# Patient Record
Sex: Female | Born: 1935 | Race: White | Hispanic: No | Smoking: Former smoker
Health system: Southern US, Community
[De-identification: ages and names within clinical notes are randomized; demographics above are authoritative.]

## PROBLEM LIST (undated history)

## (undated) DIAGNOSIS — E559 Vitamin D deficiency, unspecified: Secondary | ICD-10-CM

## (undated) DIAGNOSIS — I10 Essential (primary) hypertension: Secondary | ICD-10-CM

## (undated) DIAGNOSIS — H269 Unspecified cataract: Secondary | ICD-10-CM

## (undated) DIAGNOSIS — I639 Cerebral infarction, unspecified: Secondary | ICD-10-CM

## (undated) DIAGNOSIS — K635 Polyp of colon: Secondary | ICD-10-CM

## (undated) DIAGNOSIS — M5030 Other cervical disc degeneration, unspecified cervical region: Secondary | ICD-10-CM

## (undated) DIAGNOSIS — M722 Plantar fascial fibromatosis: Secondary | ICD-10-CM

## (undated) DIAGNOSIS — M810 Age-related osteoporosis without current pathological fracture: Secondary | ICD-10-CM

## (undated) DIAGNOSIS — E119 Type 2 diabetes mellitus without complications: Secondary | ICD-10-CM

## (undated) DIAGNOSIS — E039 Hypothyroidism, unspecified: Secondary | ICD-10-CM

## (undated) DIAGNOSIS — C9590 Leukemia, unspecified not having achieved remission: Secondary | ICD-10-CM

## (undated) DIAGNOSIS — E785 Hyperlipidemia, unspecified: Secondary | ICD-10-CM

## (undated) DIAGNOSIS — J341 Cyst and mucocele of nose and nasal sinus: Secondary | ICD-10-CM

## (undated) DIAGNOSIS — D649 Anemia, unspecified: Secondary | ICD-10-CM

## (undated) DIAGNOSIS — J439 Emphysema, unspecified: Secondary | ICD-10-CM

## (undated) DIAGNOSIS — G589 Mononeuropathy, unspecified: Secondary | ICD-10-CM

## (undated) DIAGNOSIS — Z923 Personal history of irradiation: Secondary | ICD-10-CM

## (undated) DIAGNOSIS — F419 Anxiety disorder, unspecified: Secondary | ICD-10-CM

## (undated) HISTORY — DX: Cyst and mucocele of nose and nasal sinus: J34.1

## (undated) HISTORY — DX: Anemia, unspecified: D64.9

## (undated) HISTORY — DX: Hyperlipidemia, unspecified: E78.5

## (undated) HISTORY — DX: Polyp of colon: K63.5

## (undated) HISTORY — DX: Age-related osteoporosis without current pathological fracture: M81.0

## (undated) HISTORY — PX: EYE SURGERY: SHX253

## (undated) HISTORY — DX: Plantar fascial fibromatosis: M72.2

## (undated) HISTORY — DX: Anxiety disorder, unspecified: F41.9

## (undated) HISTORY — PX: ESOPHAGOGASTRODUODENOSCOPY: SHX1529

## (undated) HISTORY — PX: APPENDECTOMY: SHX54

## (undated) HISTORY — DX: Unspecified cataract: H26.9

## (undated) HISTORY — DX: Other cervical disc degeneration, unspecified cervical region: M50.30

## (undated) HISTORY — DX: Emphysema, unspecified: J43.9

## (undated) HISTORY — DX: Vitamin D deficiency, unspecified: E55.9

## (undated) HISTORY — PX: COLONOSCOPY W/ POLYPECTOMY: SHX1380

## (undated) HISTORY — PX: CHOLECYSTECTOMY: SHX55

## (undated) HISTORY — DX: Type 2 diabetes mellitus without complications: E11.9

---

## 1998-07-08 ENCOUNTER — Encounter: Payer: Self-pay | Admitting: Emergency Medicine

## 1998-07-08 ENCOUNTER — Emergency Department (HOSPITAL_COMMUNITY): Admission: EM | Admit: 1998-07-08 | Discharge: 1998-07-08 | Payer: Self-pay | Admitting: Emergency Medicine

## 1998-07-13 ENCOUNTER — Encounter: Payer: Self-pay | Admitting: Emergency Medicine

## 1998-07-13 ENCOUNTER — Inpatient Hospital Stay (HOSPITAL_COMMUNITY): Admission: EM | Admit: 1998-07-13 | Discharge: 1998-07-18 | Payer: Self-pay | Admitting: Emergency Medicine

## 1998-07-14 ENCOUNTER — Encounter: Payer: Self-pay | Admitting: Gastroenterology

## 1998-07-15 ENCOUNTER — Encounter: Payer: Self-pay | Admitting: Gastroenterology

## 1998-07-17 ENCOUNTER — Encounter: Payer: Self-pay | Admitting: Gastroenterology

## 1998-10-11 ENCOUNTER — Encounter: Admission: RE | Admit: 1998-10-11 | Discharge: 1999-01-09 | Payer: Self-pay | Admitting: Internal Medicine

## 2001-10-22 ENCOUNTER — Encounter: Payer: Self-pay | Admitting: Emergency Medicine

## 2001-10-22 ENCOUNTER — Inpatient Hospital Stay (HOSPITAL_COMMUNITY): Admission: EM | Admit: 2001-10-22 | Discharge: 2001-11-02 | Payer: Self-pay | Admitting: Emergency Medicine

## 2001-10-23 ENCOUNTER — Encounter: Payer: Self-pay | Admitting: Internal Medicine

## 2001-10-25 ENCOUNTER — Encounter: Payer: Self-pay | Admitting: Internal Medicine

## 2002-10-07 ENCOUNTER — Emergency Department (HOSPITAL_COMMUNITY): Admission: EM | Admit: 2002-10-07 | Discharge: 2002-10-08 | Payer: Self-pay | Admitting: Emergency Medicine

## 2002-10-10 ENCOUNTER — Encounter (HOSPITAL_BASED_OUTPATIENT_CLINIC_OR_DEPARTMENT_OTHER): Payer: Self-pay | Admitting: Internal Medicine

## 2002-10-10 ENCOUNTER — Encounter: Admission: RE | Admit: 2002-10-10 | Discharge: 2002-10-10 | Payer: Self-pay | Admitting: Internal Medicine

## 2003-01-19 ENCOUNTER — Inpatient Hospital Stay (HOSPITAL_COMMUNITY): Admission: EM | Admit: 2003-01-19 | Discharge: 2003-01-22 | Payer: Self-pay | Admitting: Emergency Medicine

## 2003-01-20 ENCOUNTER — Encounter: Payer: Self-pay | Admitting: Internal Medicine

## 2003-06-28 ENCOUNTER — Inpatient Hospital Stay (HOSPITAL_COMMUNITY): Admission: EM | Admit: 2003-06-28 | Discharge: 2003-07-02 | Payer: Self-pay | Admitting: Emergency Medicine

## 2003-06-28 ENCOUNTER — Encounter: Payer: Self-pay | Admitting: Emergency Medicine

## 2003-06-29 ENCOUNTER — Encounter (INDEPENDENT_AMBULATORY_CARE_PROVIDER_SITE_OTHER): Payer: Self-pay | Admitting: *Deleted

## 2003-06-29 ENCOUNTER — Encounter: Payer: Self-pay | Admitting: Internal Medicine

## 2003-07-02 ENCOUNTER — Inpatient Hospital Stay: Admission: RE | Admit: 2003-07-02 | Discharge: 2003-07-13 | Payer: Self-pay | Admitting: Internal Medicine

## 2003-07-13 ENCOUNTER — Inpatient Hospital Stay (HOSPITAL_COMMUNITY)
Admission: RE | Admit: 2003-07-13 | Discharge: 2003-08-04 | Payer: Self-pay | Admitting: Physical Medicine & Rehabilitation

## 2003-09-09 ENCOUNTER — Encounter
Admission: RE | Admit: 2003-09-09 | Discharge: 2003-12-08 | Payer: Self-pay | Admitting: Physical Medicine & Rehabilitation

## 2003-09-22 ENCOUNTER — Encounter
Admission: RE | Admit: 2003-09-22 | Discharge: 2003-12-21 | Payer: Self-pay | Admitting: Physical Medicine & Rehabilitation

## 2003-12-22 ENCOUNTER — Encounter
Admission: RE | Admit: 2003-12-22 | Discharge: 2004-01-08 | Payer: Self-pay | Admitting: Physical Medicine & Rehabilitation

## 2004-01-12 ENCOUNTER — Emergency Department (HOSPITAL_COMMUNITY): Admission: EM | Admit: 2004-01-12 | Discharge: 2004-01-12 | Payer: Self-pay | Admitting: Emergency Medicine

## 2004-01-13 ENCOUNTER — Inpatient Hospital Stay (HOSPITAL_COMMUNITY): Admission: AD | Admit: 2004-01-13 | Discharge: 2004-01-26 | Payer: Self-pay | Admitting: Internal Medicine

## 2004-01-18 ENCOUNTER — Encounter (INDEPENDENT_AMBULATORY_CARE_PROVIDER_SITE_OTHER): Payer: Self-pay | Admitting: *Deleted

## 2004-01-26 ENCOUNTER — Inpatient Hospital Stay: Admission: RE | Admit: 2004-01-26 | Discharge: 2004-02-12 | Payer: Self-pay | Admitting: Internal Medicine

## 2004-02-15 ENCOUNTER — Encounter: Admission: RE | Admit: 2004-02-15 | Discharge: 2004-04-14 | Payer: Self-pay | Admitting: Internal Medicine

## 2004-03-18 ENCOUNTER — Encounter
Admission: RE | Admit: 2004-03-18 | Discharge: 2004-06-16 | Payer: Self-pay | Admitting: Physical Medicine & Rehabilitation

## 2004-11-11 ENCOUNTER — Inpatient Hospital Stay (HOSPITAL_COMMUNITY): Admission: EM | Admit: 2004-11-11 | Discharge: 2004-11-14 | Payer: Self-pay | Admitting: Emergency Medicine

## 2005-03-07 ENCOUNTER — Ambulatory Visit: Payer: Self-pay | Admitting: Oncology

## 2006-04-03 ENCOUNTER — Emergency Department (HOSPITAL_COMMUNITY): Admission: EM | Admit: 2006-04-03 | Discharge: 2006-04-03 | Payer: Self-pay | Admitting: Pediatrics

## 2008-10-05 ENCOUNTER — Inpatient Hospital Stay (HOSPITAL_COMMUNITY): Admission: EM | Admit: 2008-10-05 | Discharge: 2008-10-08 | Payer: Self-pay | Admitting: Emergency Medicine

## 2010-12-26 LAB — URINALYSIS, ROUTINE W REFLEX MICROSCOPIC
Bilirubin Urine: NEGATIVE
Ketones, ur: NEGATIVE mg/dL
pH: 6.5 (ref 5.0–8.0)

## 2010-12-26 LAB — POCT CARDIAC MARKERS
CKMB, poc: <1 ng/mL — ABNORMAL LOW (ref 1.0–8.0)
CKMB, poc: <1 ng/mL — ABNORMAL LOW (ref 1.0–8.0)
Myoglobin, poc: 42.1 ng/mL (ref 12–200)
Myoglobin, poc: 78.2 ng/mL (ref 12–200)

## 2010-12-26 LAB — URINE MICROSCOPIC-ADD ON

## 2010-12-26 LAB — BASIC METABOLIC PANEL
Calcium: 8.5 mg/dL (ref 8.4–10.5)
GFR calc Af Amer: >60 mL/min (ref >60)
GFR calc non Af Amer: >60 mL/min (ref >60)
Potassium: 3.6 mEq/L (ref 3.5–5.1)
Sodium: 142 mEq/L (ref 135–145)

## 2010-12-26 LAB — DIFFERENTIAL
Basophils Absolute: 0 10*3/uL (ref 0.0–0.1)
Basophils Relative: 0 % (ref 0–1)
Eosinophils Absolute: 0.1 10*3/uL (ref 0.0–0.7)
Lymphocytes Relative: 18 % (ref 12–46)
Lymphs Abs: 1.4 10*3/uL (ref 0.7–4.0)
Monocytes Absolute: 0.6 10*3/uL (ref 0.1–1.0)
Monocytes Relative: 8 % (ref 3–12)
Neutrophils Relative %: 73 % (ref 43–77)

## 2010-12-26 LAB — CBC
Hemoglobin: 10.5 g/dL — ABNORMAL LOW (ref 12.0–15.0)
MCV: 91.3 fL (ref 78.0–100.0)
Platelets: 242 10*3/uL (ref 150–400)
RBC: 3.49 MIL/uL — ABNORMAL LOW (ref 3.87–5.11)
RBC: 3.81 MIL/uL — ABNORMAL LOW (ref 3.87–5.11)
RDW: 13.7 % (ref 11.5–15.5)
WBC: 4.9 10*3/uL (ref 4.0–10.5)

## 2010-12-26 LAB — COMPREHENSIVE METABOLIC PANEL
ALT: 26 U/L (ref 0–35)
AST: 36 U/L (ref 0–37)
Albumin: 3.9 g/dL (ref 3.5–5.2)
Alkaline Phosphatase: 59 U/L (ref 39–117)
BUN: 8 mg/dL (ref 6–23)
Creatinine, Ser: 0.71 mg/dL (ref 0.4–1.2)
Creatinine, Ser: 0.72 mg/dL (ref 0.4–1.2)
GFR calc non Af Amer: >60 mL/min (ref >60)
Potassium: 3.7 mEq/L (ref 3.5–5.1)
Potassium: 3.7 mEq/L (ref 3.5–5.1)
Sodium: 136 mEq/L (ref 135–145)
Sodium: 141 mEq/L (ref 135–145)

## 2010-12-26 LAB — AMYLASE: Amylase: 52 U/L (ref 27–131)

## 2010-12-26 LAB — GLUCOSE, CAPILLARY
Glucose-Capillary: 101 mg/dL — ABNORMAL HIGH (ref 70–99)
Glucose-Capillary: 109 mg/dL — ABNORMAL HIGH (ref 70–99)
Glucose-Capillary: 125 mg/dL — ABNORMAL HIGH (ref 70–99)

## 2010-12-26 LAB — CROSSMATCH: Antibody Screen: NEGATIVE

## 2010-12-26 LAB — URINE CULTURE: Colony Count: 30000

## 2010-12-26 LAB — ABO/RH: ABO/RH(D): O POS

## 2011-01-24 NOTE — Discharge Summary (Signed)
[name], ARCOS NO.:  [id number]   MEDICAL RECORD NO.:  [id number]          PATIENT TYPE:  INP   LOCATION:  5128                         FACILITY:  [facility]   PHYSICIAN:  Gwen Pounds, MD       DATE OF BIRTH:  August 03, 1929   DATE OF ADMISSION:  10/05/2008  DATE OF DISCHARGE:                               DISCHARGE SUMMARY   DISCHARGE DIAGNOSES:  1. Viral gastroenteritis with nausea and diarrhea.  2. Dehydration resolved.  3. Mild acute pancreatitis resolved.  4. Type 2 diabetes mellitus without issue, diet controlled. .  5. Hypertension without issue.  6. Gait disturbance, getting physical/occupational therapy.  7. Hypothyroidism.  8. Hyperlipidemia.  9. Osteoporosis.  10.Vitamin D deficiency.  11.History of stroke in October 2004.  12.Cholecystectomy in 2002.  13.Acute myelogenous leukemia in remission.   DISCHARGE MEDICATIONS:  1. Synthroid 100 daily except 0 on Sunday.  2. Lipitor 20 daily.  3. Xanax 0.25 mg p.o. b.i.d. p.r.n.  4. Aggrenox 1 tablet p.o. b.i.d.  5. K-Dur 20 mEq p.o. daily.  6. Benicar HCT 20/12.5 mg p.o. daily.  7. She is to hold the potassium and the Benicar for approximately 1      week or until blood pressure starts going up over 135-140/85.  8. Vitamin D 1000 units per day.  9. Calcitonin nasal spray 1 spray daily alternate nostrils.   DISCHARGE DIET:  Low-sodium, heart-healthy   DISCHARGE ACTIVITIES:  Home health physical/occupational therapy and  increase activity slowly.   FOLLOW UP:  She is to return to see Dr. Timothy Lasso as she is already  scheduled and call in one week with an update.   HISTORY OF PRESENT ILLNESS:  Briefly, [name] is a 77 year old  female with multiple medical problems who had a 2 to 3-day history  history of flu-like illness with increasing fatigue, increasing  weakness, increase frailty, nausea without vomiting, body aches and  diarrhea.  Increased blood sugars were noted and abdominal pain  and  distention were noted.  She took Imodium and had increasing pain.  She  was unable to keep fluids down appropriately.  She went to the emergency  department for further evaluation and treatment.  In the ED she was seen  and evaluated.  She had an elevated lipase.  She had a KUB compatible  with ileus and I was called for inpatient admission.  She was also  treated with IV fluids, Protonix, Zofran.  Tylenol was given, and by the  time I saw her, she was pain free and I finished her admission.   HOSPITAL COURSE:  [name] was admitted through the emergency  department on October 05, 2008 with symptoms of viral  gastroenteritis,  weakness, dehydration, ileus, and mild pancreatitis.  We put on relative  bowel rest. We  put her on gentle hydration.  We considered abdominal CT  if the symptoms worsened.  We ordered physical and occupational therapy  and we placed her on IV medications for symptom relief.  I followed  labs.  By the time the following  morning came around, her amylase and  lipase had resolved and went to normal.  Her BMET and CBC were normal  except for a slightly elevated hemoglobin 11.6.  Physical exam was  relatively normal except for a little bit of gaseous distention.  My  assessment was due to her viral gastroenteritis leading to an ileus and  pancreatitis, including nausea and diarrhea, dehydration and weakness,  and along with a headache.  The symptoms were resolving.  We continued  the hydration.  We continued the relative bowel rest.  She required  Toradol and Zofran as well as other medications.  This eventually did  work.  A repeat KUB showed a resolution of the ileus.  The rest of her  medical conditions stayed stable.  She was sitting in on January 27 and  she was still pretty weak.  Physical and occupational therapy saw her.  Home health OT and home health PT would help address her safety issues,  transfers and balance, and gait issues.  These will be  set up.  On  October 08, 2008 she had no complaints.  She was doing better.  She had  three bowel movements yesterday, minimal loose but not diarrhea.  All of  her vital signs were stable.  Her blood sugars were fine.  Her blood  pressures was 124/68.  She was deemed medically ready for discharge with  home health PT and OT and she can restart her blood pressure medications  as an outpatient.  She looked back to her baseline, just a little bit  weak and frail.  She resides with her family who can also help take care  of her medical and emotional needs.  Again, she is being discharged in  stable condition.      Gwen Pounds, MD  Electronically Signed     JMR/MEDQ  D:  10/08/2008  T:  10/08/2008  Job:  [id number]

## 2011-01-24 NOTE — H&P (Signed)
NAMEDELENE, MORAIS NO.:  [id number]   MEDICAL RECORD NO.:  [id number]          PATIENT TYPE:  INP   LOCATION:  5128                         FACILITY:  [facility]   PHYSICIAN:  Gwen Pounds, MD       DATE OF BIRTH:  November 09, 1928   DATE OF ADMISSION:  10/05/2008  DATE OF DISCHARGE:                              HISTORY & PHYSICAL   PRIMARY CARE Chandler Swiderski:  Gwen Pounds, MD   CHIEF COMPLAINT:  Dehydration, diarrhea, weakness, viral illness, mild  pancreatitis, and ileus.   HISTORY OF PRESENT ILLNESS:  This is a 77 year old female who woke up  yesterday weak and frail, nauseated, no vomiting, body ache, and a kind  of flu-like illness.  Her blood sugars were high for the day and were in  the 130-200 range.  Last night, she went to bed, she did not sleep well,  and this morning, she woke up and had diarrhea x4, crampy loose, no  blood.  She had mild distention in the belly.  She took some Imodium and  then had in about a hour or two later had left upper quadrant mild-to-  moderate pain, it radiated to her back and also had caused the headache.  She called back to our office and was sent to the emergency department.  She did have a BRAT diet today.  Her sister says her symptoms have  progressed.  Again, she was sent to the ED for eval and treatment where  here she had an elevated lipase, a KUB that was compatible with an  ileus.  She will need admission for evaluation and treatment.  In the  emergency department, she was started on IV fluids, Protonix, Zofran.  Tylenol was given as well.  Now, she is relatively pain free and will be  admitted for evaluation and treatment.   MEDICATIONS:  1. Synthroid 150 daily except 0 on Sunday.  2. Lipitor 20 daily.  3. Xanax 0.25 mg p.o. b.i.d.  4. Aggrenox 1 p.o. b.i.d.  5. K-Dur 20 mEq p.o. daily.  6. Benicar HCT 20/12.5 daily.  7. Vitamin D 1000 units daily.  8. Calcitonin nasal spray alternating nostrils daily.   ALLERGIES:   PREDNISONE.   PAST MEDICAL HISTORY:  1. Diabetes mellitus type 2, diet controlled.  2. Hypertension.  3. Hypothyroid.  4. Hyperlipidemia.  5. Osteoporosis.  6. Vitamin D deficiency.  7. History of CVA in October 2004.  8. Cholecystectomy around 2002.  9. Acute myelogenous leukemia, currently in remission.   SOCIAL HISTORY:  She is widowed, 3 children, 3 grandchildren.  She quit  tobacco in 1994.   FAMILY HISTORY:  Coronary artery disease and her sister died of lung  cancer.   REVIEW OF SYSTEMS:  Recent strep throat.  No one in the house or in the  grandchildren.  Otherwise right now, she has dry mouth, occipital  headache, mild abdominal pain, and back pain.  No nausea, no vomiting.  Diarrhea that is having no blood.  She is weak, tired, frail.  No fever  and positive chills were  noted.   PHYSICAL EXAMINATION:  VITAL SIGNS:  Temperature 98.4, blood pressure  128-142/49-69, heart rate 81-100, respiratory rate 16-20, sating 96% on  room air.  GENERAL:  Alert and oriented x3.  PULMONARY:  Clear to auscultation bilaterally.  CARDIAC:  Regular.  ABDOMEN:  Soft.  There is an expected minimal bloating.  No pain.  EXTREMITIES:  No edema.  NECK:  No JVD.  HEENT:  Oropharynx is dry.   ANCILLARY DATA:  White count 7.6, hemoglobin 12.7, platelet count 265.  Sodium 136, potassium 3.7, chloride 101, bicarb 26, BUN 13, creatinine  0.72, glucose 127.  Troponin I less is less than 0.05.  LFTs are within  normal limits.  Lipase is 87 with normal range of 11-59.  Urinalysis is  negative.  Acute abdominal series shows normal chest, nonspecific ileus.  EKG shows sinus tachycardia without changes.   ASSESSMENT:  This is an elderly female with 36 hours of viral  gastroenteritis versus flu, weakness, dehydration, ileus, and mild  pancreatitis.  She does not have a gallbladder, although I guess  gallstones and choledocholithiasis there is low possibility.  I have no  reason to suspect that her  triglycerides are up, as she is nonalcoholic  and certainly with mild illness that she has had, we can certainly  follow this clinically before considering any CT scans and further  tests.   PLAN:  1. Admit.  2. Gentle hydration.  3. Sips and chips.  4. Follow labs.  5. Consider abdominal CT if situation worsens.  6. PT/OT, case management.  7. Bedside commode.  8. Expect 48- to 72-hour hospital stay if she follows what I      anticipate.  9. Home medications, I was able to restart.  10.DVT prophylaxis had been ordered.  11.I will follow up with CMET, CBC, lipase, amylase tomorrow a.m.  12.KUB tomorrow portable and again hold on abdominal ultrasound at the      moment.      Gwen Pounds, MD  Electronically Signed     JMR/MEDQ  D:  10/05/2008  T:  10/06/2008  Job:  [id number]

## 2011-01-27 NOTE — H&P (Signed)
. [facility]  Patient:    [name], [name] Visit Number: [id number] MRN: [id number]          Service Type: MED Location: [id number] Attending Physician:  Gwen Pounds Dictated by:   Gwen Pounds, M.D. Admit Date:  10/22/2001                           History and Physical  CHIEF COMPLAINT:  Hypertension, headache, dizziness, nausea, diarrhea x three.   HISTORY OF PRESENT ILLNESS:  This 77 year old white female with past medical history compatible with diet-controlled diabetes, hypothyroid, hypertension, hyperlipidemia, and AML now in remission who is come in twice in the last two days to see me. On February 10, she came in for a blood pressure check one month after starting hydrochlorothiazide 12.5 mg. This was started on September 09, 2001. Blood pressure was checked, and it was 220/92. She had symptoms of dizziness, nausea, and presyncope. She was brought back to an exam room and was allowed to relax. She denied any chest pain or shortness of breath. She was given Altace 5 mg p.o. x one and started on it q.d. Her symptoms improved, and she was discharged with blood pressure 190/95 and told to go to the emergency room if symptoms recurred or worsened. They did recur, and she called the M.D. on call who advised her to go the ED. She wanted to sleep it off and did not go to the ED. Blood pressures overnight were 158/82, 158/82. She came in this a.m. for a repeat blood pressure check and with symptoms of headache, dizziness, nausea, diarrhea x three, and continued presyncope. Blood pressure measured was 186/102. Again, placed in a room, allowed to relax. She also again denied chest pain and shortness of breath, numbness, tingling, or other symptoms. She did complain of rapid heart beat. She did complain of dizziness with standing. EKG was checked and was without significant changes. She was nonorthostatic. She was sent to the ED for further workup  and cranial CT. Cranial CT is negative. She is now being admitted for further workup.  PAST MEDICAL HISTORY: 1. Diabetes mellitus, type 2, diet controlled. Last A1C was 5.1. 2. Hypothyroid. 3. Hypertension. 4. Hyperlipidemia. 5. AML in remission. 6. Cholecystectomy in 2000.  MEDICATIONS: 1. Synthroid 150 mcg five x per week. 2. Lipitor 10 mg q.d. 3. Xanax 0.25 mg p.r.n. 4. Aspirin 81 mg qd 5. Hydrochlorothiazide 12.5 mg qd 6. Zoloft starter pack, which is currently on hold. 7. Altace 5 mg q.d.  SOCIAL HISTORY:  She is widowed. She does not smoke. She does not drink. She quit tobacco in 1994. She has three children, three grandchildren, one great grandchild. She watches her 73-year-old grandchild on a daily basis.  FAMILY HISTORY:  Sister dying of lung cancer.  REVIEW OF SYSTEMS:  She denies chest pain, shortness of breath, dyspnea on exertion, orthopnea, PND, edema. No fever or chills. Her weight is stable at 150 pounds. No adenopathy. No rash and no URI symptoms. No urinary symptoms. No weakness, no numbness. She has been depressed and tearful secondary to her sister dying of lung cancer. Was started on Zoloft yesterday but will hold this until patient is better. She denies melena, bright red blood per rectum, GERD symptoms, or endocrinology symptoms. Her only positive symptoms are headache, which ______ occipital area, radiates up over her head. No photophobia. She has been dizzy and nauseated and had  diarrhea this morning.  PHYSICAL EXAMINATION:  VITAL SIGNS:  In the office are 186/102, heart rate 96, orthostatics were checked. She was 150/88 with a heart rate of 96 while lying flat, 156/90 with a heart rate of 96 while sitting, 158/98 with a heart rate of 104 while standing so obviously not orthostatic.  GENERAL:  Alert and oriented x three. No acute distress.  HEENT:  PERRL, EOMI, mild nystagmus by exam, no nystagmus by ______ exam. Atraumatic normocephalic. TMs are  clear. No palpable edema noted. Oropharynx is clear.  NECK:  Supple. No JVD. No lymphadenopathy.  CARDIAC:  Regular without murmurs, gallops, or rub. Pulses 2+ throughout.  PULMONARY:  Clear to auscultation bilaterally.  SKIN:  Clear.  ABDOMEN:  Soft, nontender, nondistended. Bowel sounds positive. She is obese. No rebound, no guarding.  EXTREMITIES:  No clubbing, cyanosis, or edema.  NEUROLOGIC:  Intact. Strength is equal and appropriate. Upper extremity and lower extremity DTRs are 2+ throughout. No neurologic deficits.  LABORATORY AND ANCILLARY TESTS:  CK 125, ______ 0.01, hemoglobin 14.3, platelet count 251, white blood cells 8.5,000. ______ all within normal limits.  Cranial CT negative.  ASSESSMENT:  This 77 year old white female with recent increase in blood pressure with new symptoms of dizziness, nausea, and diarrhea ______. This could all be related to hypertensive related changes in recent start of Altace versus some neurologic complication or some vertebral basal insufficiency.  PLAN: 1. Admit to my service. 2. Check one more set of enzymes. There is a low probability that this is    cardiac. ______ ______  until ______ overnight. 3. MRI, MRA in the a.m. to rule out vertebral basilar insufficiency. Will    consult neurology for a workup. 4. Hypertension, appears controlled now. Symptoms started before Altace but    may have worsened with the drug. Will re-evaluate in the a.m. Since blood    pressure is now controlled, this is more than likely due to urgency of    blood pressure in the emergency room was 150/80. Will increase    hydrochlorothiazide to 25 mg at this point. 5. Diabetes mellitus. Will check CBGs and place her on ______ . 6. Hyperlipidemia, stable. 7. Depression. Will hold off on the Zoloft but may need this in the future as    an outpatient. Dictated by:   Gwen Pounds, M.D. Attending Physician:  Gwen Pounds DD:  10/22/01 TD:  10/22/01 Job:  [id number]

## 2011-01-27 NOTE — H&P (Signed)
NAME:  UNIKA, NAZARENO                           ACCOUNT NO.:  [id number]   MEDICAL RECORD NO.:  [id number]                   PATIENT TYPE:  EMS   LOCATION:  MAJO                                 FACILITY:  [facility]   PHYSICIAN:  Mark A. Perini, M.D.                DATE OF BIRTH:  [birth year]   DATE OF ADMISSION:  06/28/2003  DATE OF DISCHARGE:                                HISTORY & PHYSICAL   CHIEF COMPLAINT:  Left facial droop.   HISTORY OF PRESENT ILLNESS:  Ms. Quesenberry is a recently turned 77 year old  female  with a history of  acute onset of right-sided facial droop as of 3  o'clock p.m. today. She denied any slurred speech or dysphagia type  symptoms. She did feel a little weak all over. These symptoms have persisted  and are still  present. The patient's son called our office and was advised  to bring her to the emergency room. In the emergency room a cranial CT scan  is negative for any acute abnormality, but clinically it is felt that she  has possibly had a right brain infarction. She is to be admitted for further  evaluation and treatment.   PAST MEDICAL HISTORY:  1. Left-sided Bell's palsy about 6 months ago that involved the left side of     her face including  her eye.  2. Hypertension.  3. Diet controlled type 2 diabetes mellitus. She has had a good A1C as per     the patient, but she does not know the specific number.  4. Hyperlipidemia.  5. Admission in May 2004 for a gastroenteritis and dehydration.  6. Hypothyroidism.  7. Anxiety and a past history of depression.  8. History of migraines but none in the last 6 to 8 months.  9. 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  10.      Acute myelogenous leukemia, in remission for 10 years. She sees Dr.     Cyndie Chime yearly.  11.      History of cholecystectomy.  12.      History of appendectomy.  13.      Admission in February 2003 for severe headache. She had a  full     workup at that time including  neurology and psychiatry consultation. She     had a negative EEG and negative lumbar puncture. She had an MRI and MRA     of the brain  and carotids at that time which showed some mild left     vertebral artery stenosis and some mild right nondominant vertebral     artery changes. She had bilateral small pica irregularities and she had     some plaque in the left internal carotid artery that was a small, shelf-     like plaque with no significant  stenosis. She did have some plaque at the     common carotid on the right at the origin of the right external carotid     artery that was mild as well.   ALLERGIES:  None.   MEDICATIONS:  1. Aspirin  81 mg daily.  2. Synthroid 150 micrograms 5 times a week.  3. Xanax 0.25 mg each evening and occasionally 1 dose during the day for     anxiety.  4. Lipitor 20 mg 1/2 tablet every other day.  5. Norvasc 10 mg daily.  6. HCTZ 25 mg 1/2 tablet daily.  7. She took 2 days  of fluconazole last week and she has been on amoxicillin     500 mg b.i.d. x7 days. She has 3 doses remaining.   SOCIAL HISTORY:  She is retired from [employer]. She worked on [employer] for 25 years there. She is a widow. She has 2 sons, 1  daughter, 4 grandchildren and 1 great grandchild. She lives with her son and  daughter-in-law and their children. She has no alcohol  or tobacco use  history. She is right-handed.   FAMILY HISTORY:  Her sister died of lung cancer. Her mother is still  alive  at age 73. Her father died in his 30s after being kicked in the head by a  horse. She has 2 half sisters and 1 half brother but she does not know their  medical history.   REVIEW OF SYSTEMS:  Symptoms started around 3 p.m. as noted in the history  of present illness. She denies any fevers or chills. She has not had  a flu  shot this year. She has had no cold or flu symptoms. She has had no chest  pain or shortness of  breath or peripheral edema. She does have some itching  and some redness and scaliness between the 4th and 5th digit on her left  foot; this was treated with yeast treatment and antibiotic in the last week  by Dr. Timothy Lasso. It is not itching as much now. No genitourinary or GI  symptomatology. No diplopia or visual changes noted.   PHYSICAL EXAMINATION:  VITAL SIGNS:  Temperature 97.9, blood pressure  155/87, pulse 115 and regular, respiratory rate 20, 97% saturation on room  air.  GENERAL:  She is in no acute distress.  HEENT:  She does have a left facial droop with some down turning of the left  angle of her mouth. The eye is not involved. She is normocephalic and  atraumatic. Pupils are equal, round and reactive to light. Extraocular  movements intact. No icterus, no jugular venous distention. There are slight  bilateral carotid bruits versus transmitted murmur sounds.  HEART:  Tachycardic but regular rhythm with a 2/6 murmur at the left and  right sternal border in systole. There is no rub or gallop.  ABDOMEN:  Benign. There are 2+ distal pulses and no edema.  EXTREMITIES:  Warm.  NEUROLOGIC:  She has normal speech pattern and quality. She is alert and  oriented x4 and is  a fairly good historian. The left upper extremity has  4+/5 weakness versus 5/5 strength  of the right upper extremity. She has 3+  deep tendon reflexes of the left upper extremity which is more brisk than  the right which is 2+. She has 2+ deep tendon reflexes in both lower  extremities. The right toe is downgoing on Babinski testing but the left toe  is  neutral. She has slight left lower extremity weakness as well. Negative  Romberg testing, negative finger-nose-finger testing. No pronator drift is  noted. She does have some trouble with gait in terms of heel-to-toe walking.  She is very unsteady and clumsy with this.   LABORATORY DATA:  Cranial CT scan is negative.  INR 0.8, PTT 27. White count 8.0, hemoglobin  13.0, platelet count 275,000,  normal differential. Sodium 135, potassium 3.0, chloride 102, CO2 25, BUN  11, creatinine 0.7, glucose 148. Liver function tests normal.   ASSESSMENT AND PLAN:  A 77 year old female  with left facial droop and mild  left upper extremity greater than left lower extremity weakness. 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 This is probably small  vessel related. We will admit and check an EKG and place on telemetry  and  oxygen. We will continue IV normal saline at k.v.o. We will replace  potassium. We will continue other home medications but will add Plavix. We  will ask for a neurology consultation  and will check MRI, MRA  intracranially and we will also check carotid Dopplers and a 2D  echocardiogram.                                                Loraine Leriche A. Waynard Edwards, M.D.    MAP/MEDQ  D:  06/28/2003  T:  06/28/2003  Job:  [id number]   cc:   Gwen Pounds, M.D.  [address]  [place]  [place] [address]  Fax: [phone]   Marolyn Hammock. Thad Ranger, M.D.  [address]  [address]  [place]  [place] [address]  Fax: [phone]   Genene Churn. Cyndie Chime, M.D.  [address] [facility]  [place]  [place] [address]  Fax: [phone]

## 2011-01-27 NOTE — Assessment & Plan Note (Signed)
HISTORY:  The patient is back regarding her right medullary stroke with  balance deficits.  She has continued to improve with therapies in the  balance program.  She is finishing up with her latest course of outpatient  PT.  She still has some balance difficulties when she tires.  She denies any  problems with vertigo, headaches, appetite, weakness, numbness, or spasms.  She is essentially at an independent level for all mobility and self-care.  She is doing some housework.  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  She  said that overall her fatigue is better, though she does worsen generally as  the day goes along.   REVIEW OF SYSTEMS:  A 14-point survey was completed by the patient, with  pertinent positives as listed above.  The patient also notes occasional  anxiety.   PHYSICAL EXAMINATION:  VITAL SIGNS:  Blood pressure 144/53, pulse 88,  respirations 20.  She is saturating 97% on room air.  GENERAL:  The patient walks with a slightly wide-based but steady gait.  She  is bright and well-kempt.  EXTREMITIES/NEUROLOGIC:  She has really near 5/5 strength in both upper and  lower extremities.  She may be a trace weaker on the left side.  Fine motor  coordination was fair in the left upper extremity.  She had good intact  sensory function.  Reflexes were 2+ bilaterally.  The gait was tested with  heel-to-toe ambulation, and the patient tended to fall to the left.  I had  her stand on her toes and heels, and she lost balance as well today to the  left side.  Cognition was appropriate.  She is able to follow multiple-step  commands.  The patient ambulated without an assistive device today.  HEART:  A regular rate and rhythm.  CHEST:  Clear.  ABDOMEN:  Soft, nontender.  SKIN:  Intact with normal pulses and temperature.   ASSESSMENT:  1. Status post right medullary stroke.  2. Hypertension.  3. Hypothyroidism.   PLAN:  1. The patient will complete her outpatient therapies.  2. She  will continue on Aggrenox for stroke prophylaxis.  3. Continue with her home exercise program.  4. I have nothing further to offer this patient at this point, as she is     doing very well.  I will see her back on a p.r.n. basis in the future.      Ranelle Oyster, M.D.   ZTS/MedQ  D:  03/22/2004 15:10:33  T:  03/22/2004 15:54:21  Job #:  [id number]   cc:   Gwen Pounds, M.D.  [address]  [place]  [place] [address]  Fax: [phone]

## 2011-01-27 NOTE — Consult Note (Signed)
NAME:  VEE, BAHE NO.:  [id number]   MEDICAL RECORD NO.:  [id number]                   PATIENT TYPE:  INP   LOCATION:  3025                                 FACILITY:  [facility]   PHYSICIAN:  Marlan Palau, M.D.               DATE OF BIRTH:  Apr 22, 1929   DATE OF CONSULTATION:  06/29/2003  DATE OF DISCHARGE:                                   CONSULTATION   REASON FOR CONSULTATION:  [name] is a 77 year old right-handed white  female born on June 27, 2001, with a history of diabetes, hypertension,  and prior left Bell's palsy. The patient noted that she had onset of some  left facial symptoms also involving the left arm and leg at around 2:30 p.m.  on the day of admission.  The patient had some mild sensory changes on the  left face, not clear that it involves the left arm or leg. The patient had  some slight gait instability, mild slurred speech. Denied any visual field  changes. Did note some left-sided headache.  This patient was brought to the  emergency department  and was admitted for further evaluation.  The patient  has been set up for an MRI scan of the brain which has not yet been done. A  carotid Doppler study has been done and is unremarkable.  A 2-D  echocardiogram is pending.  The patient was on aspirin prior to admission  and is now on aspirin and Plavix during this hospitalization.  Neurology has  been asked to see this patient for further evaluation.   PAST MEDICAL HISTORY:  1. 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  Diagnosis     was about 8 or 9 years ago.  2. History of hypothyroidism.  3. Hyperlipidemia.  4. Diabetes.  5. Depression/anxiety.  6. Hypertension.  7. Fracture of the right arm in the past.  8. Obesity.  9. Gallbladder surgery.  10.      Status post appendectomy.   MEDICATIONS AT THIS TIME:  1. Aspirin 325 mg daily.  2. Plavix 75 mg daily.  3. Protonix 40 mg daily.  4. Lipitor  10 mg daily.  5. Synthroid 0.15 mg daily.  6. Potassium 20 mEq daily.  7. Norvasc 10 mg daily.  8. Hydrochlorothiazide 12.5 mg daily.  9. Xanax 0.125 mg b.i.d.  10.      Tylenol p.r.n.   SOCIAL HISTORY:  The patient does not smoke or drink. The patient lives in  the [place] area. She is a widow. She has there children who are alive  and well, with the exception of one daughter with history of MS.   ALLERGIES:  No known drug allergies.   FAMILY HISTORY:  Notable for the fact that the mother is still alive, age  35, with coronary artery disease and osteoporosis. The father died in his  78s following  a fall off a horse with a closed head injury. The patient has  one sister who died with cancer. The patient has two half-sisters with  diabetes and one with possible MS, and a half-brother who is alive and well.   REVIEW OF SYSTEMS:  No fever or chills. The patient denies headache at this  time. Denies neck pain, shortness of breath, chest pain, nausea, or  vomiting.  The patient denies any problems controlling the bowel or bladder.  She denies blackout episodes or severe dizziness.   PHYSICAL EXAMINATION:  VITAL SIGNS: Blood pressure 150/64, heart rate 80,  respiratory rate 16, temperature afebrile.  GENERAL: The patient is a moderately obese white female who is alert and  cooperative at the time of examination.  HEENT:  Head is normocephalic. Pupils equal, round, and reactive to light.  Disks are flat bilaterally.  NECK: Supple with no carotid bruits.  RESPIRATORY:  Clear.  CARDIAC: Regular rate and rhythm with no obvious murmurs or rubs noted.  EXTREMITIES: Without significant edema.  NEUROLOGIC: Cranial nerves as above. Facial symmetry appears to be  relatively intact. The patient notes slight decrease in pinprick sensation  in the left face compared to the right. The patient otherwise has good  strength on direct testing of facial muscles, muscles of the head, turning   shoulder shrug bilaterally, tongue protrudes in the midline, speech is well  enunciated, and not obviously dysarthric or aphasic. Motor testing reveals  5/5 strength in all fours.  Good symmetrical motor tone is noted throughout.  Sensory testing notes some decreased pinprick sensation in the left arm as  compared to the right, symmetric in the lower extremities. Vibratory  sensation is symmetric throughout. The patient has good finger-nose-finger  bilaterally, but there is some question of very mild ataxia on the left  compared to the right. Rapid alternating movements are slightly depressed on  the left are compared to the right.  The patient has good symmetry to toe-to-  finger bilaterally.  The patient is not ambulated.  Deep tendon reflexes are  fairly normal and symmetric throughout. Toes are neutral bilaterally.  No  obvious drift is seen with the left upper extremity.   LABORATORY AND ACCESSORY DATA:  Laboratory values are notable for white  count of 8, hemoglobin 13, hematocrit 38.3, MCV 83.1, platelet count  275,000.  INR 0.8.  Sodium 135, potassium 3, chloride 102, CO2 25, glucose  148, BUN 11, creatinine 0.7, calcium 8.7, total protein 7, albumin 4, AST  23, ALT 26, alkaline phosphatase 106, total bilirubin 0.4.   IMPRESSION:  1. New onset of left-sided central alteration, mild weakness. Rule out     subcortical infarct, right brain.  2. Diabetes.  3. Hypertension.  4. Hypercholesterolemia.   DISCUSSION:  This patient does have multiple risk factors for small and  large vessel disease. The patient has had a stroke workup initiated at this  point. The patient is now on aspirin and Plavix at this time. She was on  aspirin prior to admission.   PLAN:  1. MRI scan to brain.  2. MRI angiogram.  3. 2-D echocardiogram.  4. Aspirin and Plavix for now.  5.     May consider Aggrenox in the future. 6. The patient has minimal deficits and likely does not need much in the way      of PT, OT, and speech therapy input. Will follow the patient's clinical     course while in house.  Marlan Palau, M.D.    CKW/MEDQ  D:  06/29/2003  T:  06/29/2003  Job:  [id number]   cc:   Gwen Pounds, M.D.  [address]  [place]  [place] [address]  Fax: [phone]   [facility]  [address]

## 2011-01-27 NOTE — Discharge Summary (Signed)
[name], [name]                           ACCOUNT NO.:  [id number]   MEDICAL RECORD NO.:  [id number]                   PATIENT TYPE:  ORB   LOCATION:  4501                                 FACILITY:  [facility]   PHYSICIAN:  Gwen Pounds, M.D.                 DATE OF BIRTH:  [birth year]   DATE OF ADMISSION:  01/26/2004  DATE OF DISCHARGE:  02/12/2004                                 DISCHARGE SUMMARY   DISCHARGE DIAGNOSIS:  1. Physical deconditioning.  2. Status post community-acquired pneumonia.  3. Bronchitis.  4. Diarrhea with heme-positive stools.  5. Status post colonoscopy with benign polyps.  6. Diet-controlled diabetes.  7. Hypothyroidism.  8. Hypertension.  9. Hyperlipidemia.  10.      History of acute myelocytic leukemia, in remission.  11.      Osteoporosis.  12.      Cholecystectomy in 2000.  13.      Status post pontine cerebrovascular accident.   MEDICATION LIST:  1. Synthroid 150 mcg, except Sunday.  2. Lipitor 20 mg daily.  3. Xanax 0.25 mg p.o. b.i.d. p.r.n.  4. Hydrochlorothiazide 12.5 mg p.o. daily.  5. Norvasc 10 mg daily.  6. Aggrenox 1 p.o. b.i.d.  7. K-Dur 20 mEq p.o. daily.  8. Miacalcin nasal spray -- 1 spray, alternating nostrils, daily.  9. Calcium.  10.      Vitamin D.   PROCEDURES:  Medical and physical management.   HISTORY OF PRESENT ILLNESS:  Ms. Cage is a very pleasant 77 year old  female, admitted to the [facility] with physical deconditioning on Jan 26, 2004  after a hospital stay for bronchitis, progressive severe diarrhea, heme-  positive stools and community-acquired pneumonia with hypoxia.  Her medical  problems resolved with appropriate treatment, but she was left with severe  weakness and physical deconditioning and required physical therapy in the  [facility].   SUBACUTE CARE UNIT COURSE:  [name] did very well and required a little  over 2 weeks of [facility], physical therapy and occupational therapy treatment.  She improved nicely.  She  will continue outpatient physical therapy from  here.   She finished off her oral antibiotics for her pneumonia.  Her diet-  controlled diabetes remained well-controlled, blood pressures remained in  excellent control.  The only complication that happened while in the  hospital was that of some depression due to some family issues; those family  issues will be dealt with by her and her family as an outpatient.   On [name], 2005, [name] in stable condition and ready to go home.   AFTERCARE FOLLOWUP INSTRUCTIONS:  She will follow up with me during the next  1 month and she will call to make that appointment.  She will follow up with  the [facility] on Dorthula 6, 2005 from 9:15 to 11:00 for  physical therapy and occupational therapy and  she will have a heart-healthy  diet on outpatient without concentrated sweets.                                                Gwen Pounds, M.D.    JMR/MEDQ  D:  02/12/2004  T:  02/12/2004  Job:  [id number]

## 2011-01-27 NOTE — Discharge Summary (Signed)
[name], [name]                           ACCOUNT NO.:  [id number]   MEDICAL RECORD NO.:  [id number]                   PATIENT TYPE:  INP   LOCATION:  3025                                 FACILITY:  [facility]   PHYSICIAN:  Gwen Pounds, M.D.                 DATE OF BIRTH:  11/09/1928   DATE OF ADMISSION:  06/28/2003  DATE OF DISCHARGE:                                 DISCHARGE SUMMARY   DISCHARGE DIAGNOSES:  1. 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  2. History of left-sided Bell's palsy.  3. Hypertension.  4. Diet controlled diabetes mellitus type 2 with A1c of 5.8.  5. Controlled hyperlipidemia.  6. History of gastroenteritis and dehydration.  7. Hypothyroid.  8. Anxiety and depression.  9. History of migraines.  10.      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  11.      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     See Dr. Cyndie Chime.  12.      History of cholecystectomy.  13.      History of appendectomy.  14.      Ejection fraction of 40-50%.   DISCHARGE MEDICATIONS:  1. Aggrenox one tablet p.o. b.i.d.  2. Synthroid 150 mcg six times per week.  3. Xanax 0.25 mg q.12h p.r.n.  4. Lipitor 20 mg one-half tablet every other day.  5. Norvasc 10 mg daily.  6. Hydrochlorothiazide 25 mg one-half daily.  7. Protonix 40 daily which will be discontinued on discharge.  8. Potassium chloride.  9. Tylenol p.r.n.   DISCHARGE PROCEDURES:  1. 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     No other significant stenosis identified.  No aneurysm.  No acute     abnormalities.  No interval changes.  No strokes seen.  2. Carotid Dopplers.  No evidence of hemodynamically significant ICA     stenosis.  Vertebral artery flow is antegrade.  3. 2-D echo.  EF 40-50%.  Minor reduced.  No source of embolism seen.   HISTORY OF PRESENT ILLNESS:  Briefly, [name] is a 77 year old  female who presented to medical  attention on Sunday June 28, 2003 with a  right-sided facial droop and mild left-sided weakness and some ataxia.   HOSPITAL COURSE:  She was admitted for possibility of acute cerebrovascular  accident.  Dr. Anne Hahn from neurology saw her and helped coordinate the care  by getting MRIs, MRAs, 2-D echocardiogram and carotid Dopplers.  The results  are all listed above.  All the tests were negative.  All the labs were  negative, but it was felt that she clinically had a small medullary  cerebrovascular accident not picked up on MRI.  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   She underwent physical therapy and occupational therapy and it was felt that  she needed a short [facility] stay before she went home. This is going to be  arranged.  She will be going to the [facility] in stable condition.                                                  Gwen Pounds, M.D.    JMR/MEDQ  D:  07/01/2003  T:  07/01/2003  Job:  [id number]   cc:   Marlan Palau, M.D.  [address]  [address]  [place]  [place] [address]  Fax: [phone]

## 2011-01-27 NOTE — Discharge Summary (Signed)
NAMEARITA, [name]                           ACCOUNT NO.:  [id number]   MEDICAL RECORD NO.:  [id number]                   PATIENT TYPE:  IPS   LOCATION:  4005                                 FACILITY:  [facility]   PHYSICIAN:  Ranelle Oyster, M.D.             DATE OF BIRTH:  Jul 09, 1929   DATE OF ADMISSION:  07/13/2003  DATE OF DISCHARGE:  08/04/2003                                 DISCHARGE SUMMARY   DISCHARGE DIAGNOSES:  1. Right medullary infarction with balance issues.  2. Hypertension.  3. Hypothyroidism.  4. Anxiety disorder.   HISTORY OF PRESENT ILLNESS:  [name] is a 77 year old female with a  history of diabetes mellitus, type 2, and hypertension, originally admitted  on June 28, 2003, with a left facial droop, weakness, and gait  instability.  MRI done showed mild stenosis of distal right vertebral artery  and no acute abnormality.  Carotid duplex showed no ICA stenosis.  She was  evaluated internal medicine and Dr. Anne Hahn.  Patient with probable small  medullary stroke.  Diagnostics recommended for CVA prophylaxis.  The patient  was admitted to the [facility] on July 03, 2003, for low-level therapies.  PT  and OT were initiated.  The patient was noted to continue with loss of  balance with tendency to lean to the left side with decrease in spatial  awareness.  She requires setup to minimal assistance for ADLs, minimal to  moderate assistance for transfers, minimal assistance for ambulating 80 feet  with a rolling walker, and moderate assistance with hand-held assistance to  lean to the left.  She is noted to have decrease in endurance with peak rest  break.  However, the patient has been making steady progress in therapies  and NCIR was considered for progressive independence goals.   PAST MEDICAL HISTORY:  Significant for:  1. DM diet controlled.  2. Hypertension.  3. Dyslipidemia.  4. Left Bell's palsy.  5. Hiatal hernia with reflux.  6. Hypothyroidism.  7. DJD  with DDD.  8. Anxiety with depression.  9. Insomnia.  10.      Left ureteral Candida.  11.      Cholecystectomy.  12.      Gastroenteritis.  13.      Dehydration in May of 2004.  14.      AML in remission.  15.      Migraines.  16.      Appendectomy.   ALLERGIES:  No known drug allergies.   SOCIAL HISTORY:  The patient lives in a one-level home in [place],  [place], with son and daughter-in-law.  She was independent prior to  admission.  Her son can provide some assistance.  She quit tobacco 10 years  ago after a 40-pack-year history.  She does not use any alcohol.   HOSPITAL COURSE:  [name] was admitted to right-handed on July 13, 2003,  for inpatient therapies to consist of PT, OT, and speech therapy.  Diabetes mellitus was monitored on diet alone with CBG checks.  Blood sugars  overall have shown good control on a carbohydrate-modified diet.  She was  maintained on Aggrenox with CVA prophylaxis.  Laboratories checked post  admission showed a hemoglobin of 12.1, hematocrit 5.5, white count 241,  sodium 139, potassium 3.8, chloride 107, CO2 25, BUN 16, creatinine 0.8, and  glucose 183.  UA/UCS done showed multispecies.  She has had issues with some  migraines and Celebrex was admitted on a daily basis with Tylenol to be used  p.r.n.  This has helped control her headaches overall.  The patient's mood  has been stable and she has made progress during her stay.  She did continue  to demonstrate unexpected lean with tendency to push to the left side  secondary to severely impaired proprioception on the left.  These episodes  were occurring less frequently and were only noted when the patient was  standing or during ambulation.  At the time of rehabilitation, the patient  progressed to being at minimal assistance for transfers and minimal to  occasionally moderate assistance for ambulating 75 feet with a rolling  walker.  She required supervision for navigating  with a wheelchair and  minimal assistance for navigating stairs.  In terms of ADLs, the patient was  setup for upper body care, minimal assistance for lower body care, and  minimal assistance for toileting.  Speech therapy evaluation showed the  patient within functional limits for comprehension and expression without  signs of dysarthria or apraxia.  Further followup therapies to include home  health PT and OT by [facility].   DISPOSITION:  On August 04, 2003, the patient was discharged to home.   DISCHARGE MEDICATIONS:  1. Aggrenox one p.o. b.i.d.  2. Claritin 10 mg per day.  3. Lipitor 20 mg half per day.  4. Levothyroxine 150 mcg per day.  5. K-Dur 20 mEq a day.  6. Norvasc 10 mg a day.  7. Hydrochlorothiazide 25 mg half p.o. per day.  8. Xanax 0.25 mg half p.o. t.i.d.  9. Celebrex 100 mg per day.   ACTIVITY:  At supervision for ambulation and as tolerated.   DIET:  Regular.   SPECIAL INSTRUCTIONS:  No alcohol.  No smoking.  No driving.  [facility] to provide PT, OT, and speech therapy.   FOLLOWUP:  The patient is to follow up with Dr. Timothy Lasso for an appointment in  two to three weeks.  Follow up with Dr. Riley Kill on September 15, 2003, at 1:30  p.m.      Greg Cutter, P.A.                    Ranelle Oyster, M.D.    PP/MEDQ  D:  08/19/2003  T:  08/20/2003  Job:  [id number]   cc:   Gwen Pounds, M.D.  [address]  [place]  [place] [address]  Fax: [phone]   C. Lesia Sago, M.D.  [address]  [address]  [place]  [place] [address]  Fax: [phone]

## 2011-01-27 NOTE — H&P (Signed)
[name], [name]                           ACCOUNT NO.:  [id number]   MEDICAL RECORD NO.:  [id number]                   PATIENT TYPE:  INP   LOCATION:  3713                                 FACILITY:  [facility]   PHYSICIAN:  Gwen Pounds, M.D.                 DATE OF BIRTH:  1928-09-20   DATE OF ADMISSION:  01/19/2003  DATE OF DISCHARGE:                                HISTORY & PHYSICAL   CHIEF COMPLAINT:  Nausea, vomiting, diarrhea.   HISTORY OF PRESENT ILLNESS:  This is a 77 year old female who has had  epigastric abdominal pain, nausea, vomiting, diarrhea since 3:30 a.m. and  all day today. At some point, it was actually radiating through to her back.  She also had some dry heaves. She also complains of feeling fluish including  body aches. No known fever to the patient but had 100.9 here in the  emergency room. No known exposures except that her grandchildren and son,  who she lives with, all have been dealing with a gastrointestinal virus over  the last couple of days. She was told to go to the ED by the office and was  taken by EMS.   In the emergency room, she was diagnosed with elevated LFTs, hypokalemia,  and probable viral gastroenteritis. I was called for admission. She took  Imodium; it did not help. In the emergency room, she was given 1 L of  lactated Ringers at 300 mL/hr. She was given IV Phenergan, Pepto-Bismol, and  __________.   PAST MEDICAL HISTORY:  1. Hypertension.  2. Status post cholecystectomy.  3. Hypothyroid.  4. Anxiety.  5. History of migraine headache.  6. Hyperlipidemia.  7. Diabetes mellitus type 2, diet controlled.  8. AML, in remission.   MEDICATIONS:  1. Synthroid 150 mcg except for Wednesday and Sunday.  2. Xanax 0.25 mg q.12 h. p.r.n.  3. Lipitor 20 mg 1/2 daily.  4. Baby aspirin 81 daily.  5. Hydrochlorothiazide 25 daily.  6. Norvasc 10 daily.   ALLERGIES:  No known drug allergies.   SOCIAL HISTORY:  She is widowed. She lives  with her son and grandchildren.  No alcohol. No tobacco.   FAMILY HISTORY:  Sister died of lung cancer.   REVIEW OF SYSTEMS:  No fever. Positive chills. She is also complaining of  nausea and vomiting. No hematemesis. Diarrhea which is watery without blood.  No chest pain. No shortness of breath. No other organ system dysfunction or  complaints at this current time.   PHYSICAL EXAMINATION:  VITAL SIGNS:  Blood pressure 142/53, temperature  100.9, heart rate 105, respiratory rate 20, saturating 96% on room air.  GENERAL:  Alert and oriented x3. Appears a little pale, dehydrated, and weak  and tired.  HEENT:  PERRL. EOMI. Oropharynx is dry and pink from the recent Pepto-  Bismol.  NECK:  No JVD. No lymphadenopathy.  SKIN:  Warm to the touch.  CARDIAC:  Regular without murmurs.  PULMONARY:  Clear to auscultation bilaterally.  ABDOMEN:  Soft, nontender, nondistended. Bowel sounds positive. No  hepatosplenomegaly noted. No rebound. No guarding. It was a completely  benign abdomen.  EXTREMITIES:  No clubbing, cyanosis, or edema. Dorsalis pedal pulse 2+  bilaterally.  NEUROLOGICAL:  Intact.   LABORATORY DATA:  Urinalysis negative. CBC within normal limits with a white  blood cell count of 7,000. CMET normal except for potassium of 2.6. AST 319,  ALT 176, alkaline phosphatase 86, bilirubin was normal, total protein was  normal, lipase was 31, amylase was 40.   ASSESSMENT:  This is a 77 year old female who has been recently exposed to  viral gastroenteritis who presents with symptoms compatible with viral  gastroenteritis along with elevated liver function tests, low grade  temperature, dehydration, hypokalemia.   PLAN:  1. Admit.  2. Telemetry secondary to hypokalemia.  3. IV fluids with potassium to replace her current potassium loss. Check     LFTs in the a.m. These are up probably secondary to virus. If still high     in the a.m., will check CT or ultrasound. Check KUB tonight.  Hold     antibiotics.  4. Low grade temperature. Check blood cultures. White blood cell count is     within normal limits. Will follow at the current time. She does not     appear to be septic or seriously ill.  5. Hold home medications until tolerating p.o. tomorrow.                                               Gwen Pounds, M.D.    JMR/MEDQ  D:  01/19/2003  T:  01/20/2003  Job:  [id number]

## 2011-01-27 NOTE — Discharge Summary (Signed)
[name], [name] NO.:  [id number]   MEDICAL RECORD NO.:  [id number]          PATIENT TYPE:  INP   LOCATION:  3030                         FACILITY:  [facility]   PHYSICIAN:  Gwen Pounds, MD       DATE OF BIRTH:  1929/02/23   DATE OF ADMISSION:  11/10/2004  DATE OF DISCHARGE:  11/14/2004                                 DISCHARGE SUMMARY   DISCHARGE DIAGNOSES:  1.  Dehydration.  2.  Viral gastroenteritis.  3.  Hypotension.  4.  Hyperlipidemia.  5.  Anxiety.  6.  Hypothyroidism.  7.  Diabetes mellitus type 2.  8.  Osteoporosis.  9.  History of community acquired pneumonia.  10. History of pontine cerebrovascular accident.  11. History of colonoscopy with benign polyps.  12. Essential hypertension.  13. History of AML in remission.  14. History of cholecystectomy.   DISCHARGE MEDICATIONS:  1.  Synthroid 150 mcg p.o. daily except Sunday.  2.  Aggrenox one p.o. b.i.d.  3.  Lipitor 20 mg p.o. daily to start on November 15, 2004.  4.  Xanax two to three per day as discussed in the office.  5.  Benicar HCT 20/12.5, take one-half today and tomorrow.  Go back up to a      full tablet on Wednesday.  6.  Calcium and vitamin D.  7.  K-Dur.   DISCHARGE PROCEDURES:  1.  SPEP, UPEP which was nonspecific and associated with acute inflammatory      response.  No multiple myeloma noted.  2. KUB:  No dilated bowel to      suggest obstruction.  3. EKG:  Sinus tachycardia.  Otherwise normal.  4.      Medical management.   HISTORY OF PRESENT ILLNESS:  [name] was admitted to the emergency  department on November 11, 2004 with viral gastroenteritis, nausea, vomiting,  diarrhea, dehydration and hypotension.  She was sent to the emergency  department from our office after Phenergan failed to help with her nausea  and vomiting.  The family also reported that blood pressures at home were  70/40 and this clearly was a red flag.  On admission, it was noted that she  had  nausea, vomiting, diarrhea for 12 to 18 hours prior to presenting for  medical attention.  In the emergency room, her systolic blood pressures were  in the 70 to 110 range, heart rate was 119.  She received two to three  liters of normal saline and she was still too weak for discharge, therefore  she was admitted for further evaluation and treatment.   HOSPITAL COURSE:  [name] was admitted through the emergency  department with severe nausea, vomiting and dehydration.  This also led to  hypotension and her blood pressure medicines were held.  She continued to  get aggressive IV fluids, but she was pretty slow to respond.  She continued  to vomit even into the late hours of March 3rd and early hours of March 4th.  The next day or so because of failure to thrive,  she was ambulated and  allowed to progress slowly.  Following on the March 6, she was ready for  discharge.  Her blood pressure was starting to go up again and she was okay  to restart her medications as stated in the medication section.  C.  difficile was negative.  All her labs on discharge were fine.  Her  hemoglobin was 10.7.  She was heme positive.  This will be followed as an  outpatient.       JMR/MEDQ  D:  02/03/2005  T:  02/03/2005  Job:  [id number]

## 2011-01-27 NOTE — Discharge Summary (Signed)
NAMEKIMMIE, [name]                           ACCOUNT NO.:  [id number]   MEDICAL RECORD NO.:  [id number]                   PATIENT TYPE:  INP   LOCATION:                                       FACILITY:  [facility]   PHYSICIAN:  Gwen Pounds, M.D.                 DATE OF BIRTH:  14-Apr-1929   DATE OF ADMISSION:  07/02/2003  DATE OF DISCHARGE:  07/13/2003                                 DISCHARGE SUMMARY   DISCHARGE DIAGNOSES:  1. Cerebrovascular accident.  2. Diabetes mellitus, type 2, diet controlled.  3. Hypertension.  4. Hyperlipidemia.  5. Hypothyroidism.  6. Anxiety and depression.  7. Degenerative disk disease/degenerative joint disease.  8. Acute myelogenous leukemia in remission.  9. Gait instability.   DISCHARGE MEDICATIONS:  Please see list of new H&P to the rehabilitation  unit.   DISCHARGE PROCEDURES:  None.   HISTORY OF PRESENT ILLNESS:  Briefly, [name] is a very pleasant 77-  year-old female with diabetes mellitus, hypertension, and hyperlipidemia,  who was admitted with an acute cerebrovascular accident, although neural  imaging was negative prior to being admitted to the [facility] on July 02, 2003.  During her inpatient hospital stay, medications were adjusted,  neurology was consulted, and PT and OT evaluations were complete.  It was  determined to admit her to [facility] for her physical deconditioning, gait  disturbance, and need for rehabilitation.   HOSPITAL COURSE:  She remained in the [facility] for a little over a week and it  was determined that her progress was actually going slow, but steady and it  was determined that she would get greater benefit from going to the  rehabilitation floor.  On July 10, 2003, she had a formal rehabilitation  consult and they elected to take her on their service.  As of July 13, 2003, she was in stable condition and she was moved over to rehabilitation  on that day.  No other issues, except some  headaches on her  Aggrenox and  some constipation and diarrhea that was not felt to be anything significant  or too serious.  She remained hemodynamically stable throughout this  hospital stay and further evaluation, treatments, and recommendations will  be listed in her rehabilitation summary.                                                Gwen Pounds, M.D.    JMR/MEDQ  D:  09/02/2003  T:  09/02/2003  Job:  [id number]

## 2011-01-27 NOTE — Discharge Summary (Signed)
[place]  Patient:    KATERI, BALCH Visit Number: [id number] MRN: [id number]          Service Type: MED Location: 3000 3023 01 Attending Physician:  Gwen Pounds Dictated by:   Gwen Pounds, M.D. Admit Date:  10/22/2001 Discharge Date: 11/02/2001                             Discharge Summary  DISCHARGE DIAGNOSES:  1. Hypertension.  2. Headache presumed migraine.  3. Dizziness.  4. Failure to thrive.  5. Nausea and vomiting.  6. Diabetes mellitus type 2.  7. Hypothyroid.  8. AML in remission.  9. Hyperlipidemia. 10. Urinary tract infection. 11. Status post cholecystectomy.  DISCHARGE MEDICATIONS:  1. Synthroid 150 mcg 5 times per week.  2. Lipitor 10 mg p.o. q. daily.  3. Xanax 0.25 mg p.o. q. 12h p.r.n.  4. Aspirin 81 mg p.o. q. daily.  5. Norvasc 2.5 mg p.o. q. daily.  6. Depakote 500 mg p.o. b.i.d.  7. ______ 50 mg p.o. b.i.d. p.r.n. with meals.  8. Phenergan 12.5 p.o. q. 8h p.r.n. nausea.  9. Midrin use as directed for migraine headaches. 10. She is to discontinue hydrochlorothiazide, Zoloft and Altace.  DISCHARGE PROCEDURES:  1. Telemetry monitoring.  2. Cardiac enzymes ruled out, myocardial infarction.  3. MRI, MRA, ruled out any acute intracranial process.  4. Normal EEG.  5. Cervical spine films revealing mild bony spurring particularly at C5-C6     otherwise normal.  6. Normal SED rate, normal laboratory values.  7. Lumbar puncture within normal limits.  8. Physical therapy.  9. Cranial CT negative.  HISTORY OF PRESENT ILLNESS:  Briefly, Ms. Zandrea Vickrey is a pleasant 77 year old black female with a past medical history compatible with diet controlled diabetes, hypothyroid, hypertension, hyperlipidemia, and AML in remission who saw me in the office two times prior to admission on two consecutive days with hypertension including blood pressures in the 220/90 with associated symptoms including dizziness, nausea and  presyncope. All other review of systems was negative. She was given antihypertensives medicines and her symptoms resolved and her blood pressures improved. On day two when her symptoms recurred, it was determined that she needed to be brought into the hospital.  HOSPITAL COURSE:  Ms. Lillyonna Jasek was admitted to the hospital on October 22, 2001 with headache, nausea, vomiting, diarrhea spikes in her blood pressure, dizziness and presyncope. She underwent several tests over several days including cranial CT which was negative, MRI, MRA which was negative and ruled out vertebral basilar insufficiency, a SED rate which was normal, EKG, telemetry monitoring and CK and troponin ruling our arrhythmia, cardiac abnormality, or myocardial infarction, laboratory data ruled out any underlying electrolyte or renal dysfunction. Actually, she had EEG which was negative. She had an LP which was well within normal limits including opening and closing pressures. She was found to have a urinary tract infection and this was treated appropriately with antibiotics. With the help of Dr. Thad Ranger in neurology, we empirically placed her on IV Depakote and IV steroids for the possibility that this is just an atypical migraine. She was also placed on different narcotics along with NSAIDs and ______ inhibitors. These only mildly helped. She had occasional blood pressure spikes throughout her hospital stay and these seemed to be associated with headaches. Because of blood pressure spikes, they ruled her out for fear of chromocytoma. Over several days  with changing of her blood pressure medicines and now being on Norvasc alone, her blood pressure was well controlled at the time of discharge. To summarize the above, all studies were negative.  As stated above, her urinary tract infection was treated with p.o. Tequin.  Her hypertension was controlled on p.o. Norvasc.  For her diet controlled diabetes/empiric glucose  tolerance this was well controlled with diet and occasional insulin injections while in the hospital.  The patient was severely debilitated by her headaches and above symptoms and with clearly failure to thrive. Her prolonged hospital stay was to get the above tests and get the appropriate consultations. When it was felt we could no longer help her in the hospital, it was felt to be time for discharge. Discharge was done on November 02, 2001. She still had a headache on this day but it had been in the process of resolving.  For the possibility that this might be a headache associated with depression, a psychiatric consult was obtained on November 02, 2001. They felt that she was depressed but they could not tell if it was secondary or primary. They felt that she was anxious and they recommended an SSRI and they felt Lexapro would be the first choice. They agreed that a small dose of Elavil might be very helpful for sleep, and may actually help with the headache.  To summarize, Ms. Yalanda Garinger was hospitalized from October 22, 2001 to November 02, 2001 with significant headache associated with other symptoms. She had extensive workup, all was negative. She had two consultations including neurology and psychiatry. There was no etiology found through her symptoms but on discharge on November 02, 2001, she was felt to be stable enough for discharge. She was going to be followed by her family and brought to my office relatively quickly as an outpatient. She was deemed medically stable for discharge and appropriate arrangements were made.  DISCHARGE ACTIVITY:  She is to return fully to her baseline activities.  DISCHARGE DIET:  Continue her diabetic diet.  WOUND CARE:  N/A.  FOLLOW-UP INSTRUCTIONS:  My nurse, Efraim Kaufmann, will call and have her come in within three to five days of discharge to follow-up on her headache. Dictated by:   Gwen Pounds, M.D. Attending Physician:  Gwen Pounds DD:  11/15/01 TD:  11/18/01 Job: [id number]

## 2011-01-27 NOTE — Discharge Summary (Signed)
[name], [name]                           ACCOUNT NO.:  [id number]   MEDICAL RECORD NO.:  [id number]                   PATIENT TYPE:  INP   LOCATION:  3713                                 FACILITY:  [facility]   PHYSICIAN:  Gwen Pounds, M.D.                 DATE OF BIRTH:  July 21, 1929   DATE OF ADMISSION:  01/19/2003  DATE OF DISCHARGE:  01/21/2003                                 DISCHARGE SUMMARY   DISCHARGE DIAGNOSES:  1. Viral gastroenteritis.  2. Intractable nausea, vomiting, and diarrhea.  3. Dehydration.  4. Transaminitis.  5. Hypokalemia.  6. Hypertension.  7. Hypothyroidism.  8. Anxiety.  9. History of migraine headache.  10.      Hyperlipidemia.  11.      Diet-controlled diabetes.  12.      Acute myelogenous leukemia in remission.  13.      Status post cholecystectomy.   DISCHARGE MEDICATIONS:  1. Synthroid 150 mcg x 5 days a week.  2. Xanax 0.25 q.12h. p.r.n.  3. Lipitor 20 mg one-half p.o. daily.  4. Baby aspirin 81 mg p.o. daily.  5. Norvasc 10 mg p.o. daily.  6. Phenergan p.r.n. for nausea.  7. Kaopectate/Imodium for diarrhea.  8. She is to hold her hydrochlorothiazide 25 mg p.o. daily until she sees me     next week.   PROCEDURES:  Abdominal CT which was negative for liver, pancreatic, or  duodenal pathology.  It did show some thickening around the rectosigmoid  colon, but no masses.   HISTORY OF PRESENT ILLNESS:  Briefly, the patient was brought to medical  attention on Jan 19, 2003, with nausea, vomiting, diarrhea, low-grade fever,  body aches, increased LFTs, hypokalemia, and dehydration.  In the emergency  room, she was given IV fluids, IV Phenergan, Pepto-Bismol, and K runs.  Of  note, she has been exposed to several members of her family that have had a  GI virus recently.  She was admitted for further evaluation and treatment.   HOSPITAL COURSE:  The next morning, her liver tests went up a little bit,  although she had clinical resolution of  some of her symptoms.  She had  received IV fluids and IV potassium overnight.  It was decided that we were  going to do an abdominal CT scan.   The abdominal CT scan came back without any intra-abdominal pathology to  account for the diarrhea or the increased LFTs.  With continued IV hydration  and continued potassium, her potassium went from 2.6 on admission to 3.8 on  the Jan 21, 2003.  Her LFTs had also improved by Jan 21, 2003.  Her AST on  arrival was 319, followed by 356, followed by 171, and now 81.  Her ALT  started at 176, went to 319, down to 262, and now 189.  Therefore these are  obviously improving.   She  has currently tolerated breakfast well.  She is looking well.  She is up  moving about.  She is clearly much better.  Her exam is benign.  Although  she did have diarrhea after every time she drank something or ate something  last night.  I attribute this current finding to the fact that she had oral  contrast.  If she tolerates breakfast and lunch without issue, she will be  going home early this afternoon.   All of her home medications were held until she can tolerate them and she  has been doing fairly well without them at this current point.   DISCHARGE ACTIVITY:  There will be no restrictions.   DISCHARGE DIET:  She is to start with bland and slowly advancing until  tolerating her normal diet.   AFTERCARE FOLLOW-UP INSTRUCTIONS:  She is to follow up with me next week.   CONDITION ON DISCHARGE:  She will be discharged in stable condition.                                               Gwen Pounds, M.D.    JMR/MEDQ  D:  01/21/2003  T:  01/21/2003  Job:  [id number]

## 2011-01-27 NOTE — H&P (Signed)
[name], [name] NO.:  [id number]   MEDICAL RECORD NO.:  [id number]                   PATIENT TYPE:  ORB   LOCATION:  4504                                 FACILITY:  [facility]   PHYSICIAN:  Gwen Pounds, M.D.                 DATE OF BIRTH:  [birth year]   DATE OF ADMISSION:  01/26/2004  DATE OF DISCHARGE:                                HISTORY & PHYSICAL   PRIMARY CARE [name]:  Dr. Gwen Pounds.   CHIEF COMPLAINT:  Physical deconditioning.   HISTORY OF PRESENT ILLNESS:  This is a 77 year old white female being  admitted to the [facility] with physical deconditioning after a hospital stay for  a bronchitis, progressive and severe diarrhea, heme-positive stools and  complication of community-acquired pneumonia and hypoxia.  Her medical  problems resolved with appropriate treatment, but she was left with severe  weakness and physical deconditioning and will require aggressive physical  therapy in the [facility].   PAST MEDICAL HISTORY:  1. Diet-controlled diabetes.  2. Hypothyroidism.  3. Hypertension.  4. Hyperlipidemia.  5. History of AML, in remission.  6. Osteoporosis.  7. Cholecystectomy in 2000.  8. History of a stroke.   SOCIAL HISTORY:  She is widowed, 3 children, retired.  She quit tobacco in  1994.   FAMILY HISTORY:  Sister died of lung cancer.   ALLERGIES:  No known drug allergies.   MEDICATION LIST:  1. Synthroid 150 mcg except Sunday.  2. Lipitor 20 mg daily.  3. Xanax 0.25 mg p.o. b.i.d.  4. Hydrochlorothiazide 25 mg p.o. daily.  5. Norvasc 10 mg daily.  6. Aggrenox 1 p.o. b.i.d.  7. K-Dur 20 mEq p.o. daily.  8. Miacalcin.  9. Avelox 400 mg p.o. daily.   REVIEW OF SYSTEMS:  She denies any fever or chills, night sweats.  She is  still having some GI issues where every time she eats, she is having bowel  movements, but these bowel movements are formed.  No chest pain.  No  shortness of breath.  She is very weak.  She denies any  symptoms for head to  toe otherwise.   PHYSICAL EXAM:  VITAL SIGNS:  Temperature 97.0, pulse 71, respiratory rate  20, blood pressure 109/59, saturating 95% on room air and blood sugar is  116.  GENERAL:  In general, she is alert and oriented x3, in no acute distress.  EENT:  PERL, EOMI.  There is a slight droop in her lower lip, otherwise,  oropharynx is moist and there is no evidence of any other cranial nerve  issues.  CARDIOVASCULAR:  She is regular without murmurs, gallops or rubs.  PULMONARY:  Clear to auscultation bilaterally.  ABDOMEN:  Abdomen is soft, nontender and nondistended.  Bowel sounds are  positive x4.  EXTREMITIES:  No clubbing, no cyanosis, no edema.   LABORATORY DATA:  Laboratory data from her  inpatient stay:  Repeat fecal  occult blood testing on Jan 26, 2004 was negative.  C. difficile remained  negative.  Her last CMET on Jan 23, 2004 showed a sodium of 143, potassium  4.1, chloride 110, bicarb 25, BUN 12, creatinine 0.9, glucose 107.  LFTs  within normal limits except for a slightly low albumin of 2.7.  White count  was 6.0, hemoglobin 11.3 and platelet count 446,000.  She had a random  cortisol level on Jan 19, 2004 which was 12.6 and appropriate.  Blood  cultures throughout the hospital stay were negative.  She had a BNP which  was well within normal limits on Jan 18, 2004.   ASSESSMENT:  This is an elderly female with multiple problems, being  admitted to the subacute care unit for physical deconditioning.   PLAN:  1. Finish antibiotics for a resolving community-acquired pneumonia.  2. Follow up on TSH.  3. Physical and occupational therapy evaluate and treat.  4. To be transitioned home after appropriate physical function returns.                                                Gwen Pounds, M.D.    JMR/MEDQ  D:  01/27/2004  T:  01/27/2004  Job:  [id number]

## 2011-01-27 NOTE — Discharge Summary (Signed)
[name], [name]                           ACCOUNT NO.:  [id number]   MEDICAL RECORD NO.:  [id number]                   PATIENT TYPE:  INP   LOCATION:  5712                                 FACILITY:  [facility]   PHYSICIAN:  Gwen Pounds, M.D.                 DATE OF BIRTH:  July 23, 1929   DATE OF ADMISSION:  01/13/2004  DATE OF DISCHARGE:  01/26/2004                                 DISCHARGE SUMMARY   PRIMARY CARE Brecklyn Galvis:  Dr. Gwen Pounds.   GASTROENTEROLOGIST:  Dr. Wilhemina Bonito. Marina Goodell.   DISCHARGE DIAGNOSES:  1. Bronchitis.  2. Community-acquired pneumonia.  3. Heme-positive stools.  4. Progressive and severe diarrhea.  5. Fever.  6. Diet-controlled diabetes.  7. Hypothyroid.  8. Hypertension.  9. Hyperlipidemia.  10.      History of acute myelocytic leukemia, in remission.  11.      Osteoporosis.  12.      Cholecystectomy in 2000.  13.      History of cerebrovascular accident.   DISCHARGE MEDICATIONS:  Discharge medication list includes Synthroid,  Lipitor, Xanax, hydrochlorothiazide, Norvasc, Aggrenox, K-Dur, Miacalcin and  Avelox.   DISCHARGE PROCEDURES:  Full colonoscopy was performed; the patient had colon  polyps, maximal size -- 11 mm.  Polyps were removed and sent for biopsy with  results -- tubular adenoma, no high-grade dysplasia or malignancy, no active  colitis or dysplasia.   HISTORY OF PRESENT ILLNESS:  Briefly, [name] is a 77 year old  female with type 2 diabetes, diet-controlled, hypothyroid, hypertension and  history of CVA, who was admitted from my office on Jan 13, 2004 after being  evaluated in the emergency room on Jan 12, 2004 with progressive symptoms  including cough, abdominal pain and fever, and every cough causes severe  diarrhea.  Temperature has been up to 103+.  She was diagnosed with  bronchitis in the emergency room and treated with Z-Pack and Tessalon  Perles.  In the office, she was miserable with cough, fever, diarrhea, but  she  was nontoxic-appearing.  She was admitted for further evaluation and  treatment.   HOSPITAL COURSE:  [name] was admitted from my office on Jan 13, 2004  with fever, chills, bronchitis, severe coughing and severe diarrhea.  Acute  abdominal series from the day before showed an adynamic ileus and a pretty  significant leukocytosis with a white count of 15,100.  White count on the  day of admission was up to 21,000.  She was admitted, put on appropriate  pulmonary toilet.  She was put on some bowel rest, oxygen nebulizer  treatments and IV fluids and IV antibiotics.  My initial plan was that she  would be turned around pretty quickly and would go home in 1-2 days;  unfortunately, this did not happen; the diarrhea continued to get worse and  continued to darken to the point where when  it was heme-tested, it was heme-  positive, so therefore, I got GI involved.  GI brought her down the  following Monday for a colonoscopy, which just showed some polyps but no  colitis or inflammatory bowel disease.   Over the course of the next several days, her fever curve was resolved and  she started looking better, but the diarrhea continued.  Unfortunately, her  hospital course got worse, where she ended up developing a pneumonia with  hypoxia, requiring a change in antibiotics and more aggressive pulmonary  toilet.  The diarrhea was improved with around-the-clock antidiarrheal-type  medications.   She eventually had an abdominal ultrasound which showed that her biliary  tree was non-dilated -- she is status post cholecystectomy -- and liver and  abdomen looked appropriate.   She eventually started improving medically and she got to the point where  her medical conditions were stable, but she was left with significant  weakness.  It was determined to get physical therapy and case worker  consults; these were obtained and it was determined at that point that she  would benefit from a short [facility]  stay for physical rehab prior to being  discharged home.  On Jan 26, 2004, [name]'s diarrhea was resolved, she  was having frequent stools but these were solid, her pneumonia was resolving  to the point where she had just a minor cough but no hypoxia, was not  bringing up any sputum, was not short of breath and her x-ray showed  resolution of symptoms, therefore, she was physically stable and medically  doing much better.  As soon as a bed became available in the [facility], she was  transported there for further evaluation and treatment.                                                Gwen Pounds, M.D.    JMR/MEDQ  D:  01/27/2004  T:  01/28/2004  Job:  [id number]

## 2011-01-27 NOTE — H&P (Signed)
NAMEALEXIA, [name] NO.:  [id number]   MEDICAL RECORD NO.:  [id number]          PATIENT TYPE:  INP   LOCATION:  5157                         FACILITY:  [facility]   PHYSICIAN:  Gwen Pounds, MD       DATE OF BIRTH:  [birth year]   DATE OF ADMISSION:  11/10/2004  DATE OF DISCHARGE:                                HISTORY & PHYSICAL   PRIMARY CARE PHYSICIAN:  Dr. Creola Corn   CHIEF COMPLAINT:  Nausea, vomiting, diarrhea, dehydration, hypotension.   HISTORY OF PRESENT ILLNESS:  This is a 77 year old with multiple medical  problems who lives at home with her son, his wife, and two children who have  all had what sounds like a viral gastroenteritis that had lasted for 24-48  hours.  This was all in the last couple weeks.  [name] was admitted  with similar symptoms compatible with viral gastroenteritis including  nausea, vomiting, diarrhea, dehydration, and hypotension.  She was sent to  the ED by me after Phenergan failed to help and her son called me with  systolic blood pressure readings in the 50s-70s over diastolic readings in  the 30s-40s.  She had started vomiting around 3-4 a.m. on the morning of  November 10, 2004.  Blood pressures in the ER were 70-110.  Heart rate was 110.  After 2-3 L she was still too weak for discharge.  She would be admitted.  The diarrhea is profuse and watery.  The vomiting has only been once since  hitting the emergency room and she still has off and on nausea.   PAST MEDICAL HISTORY:  1.  History of community-acquired pneumonia.  2.  History of CVA, pontine.  3.  Colonoscopy with benign polyps.  4.  Diabetes mellitus, diet controlled.  5.  Hypothyroidism.  6.  Hypertension.  7.  Hyperlipidemia.  8.  History of AML in remission.  9.  Osteoporosis.  10. Cholecystectomy.   ALLERGIES:  No known drug allergies.   MEDICATIONS:  1.  Synthroid.  2.  Lipitor.  3.  Xanax.  4.  Benicar.  5.  Aggrenox.  6.  K-Dur.  7.   Miacalcin.  8.  Calcium.  9.  Vitamin D.   SOCIAL HISTORY:  She is widowed.  Three children.  She lives with her son,  Alferd Patee.  She quit tobacco in 1994.   FAMILY HISTORY:  Sister died of lung cancer.  Mother just died in her 78s of  coronary disease.   REVIEW OF SYSTEMS:  Nausea, vomiting, diarrhea.  Currently just diarrhea and  some mild nausea, failure to thrive, weakness.  No pulmonary complaints.  No  cardiac complaints.  There is some leg cramping.  There is weakness.  Last  Phenergan was at 4 a.m.  All other organ systems are currently negative.   PHYSICAL EXAMINATION:  VITAL SIGNS:  Temperature 97.2-98.9, heart rate 91-  100, respiratory rate 18-24, blood pressure 118-126/50, saturating 95-98% on  2 L, weight 157 pounds.  Urine output 375 this shift.  GENERAL:  Alert and oriented x3,  just tired.  HEENT:  Oropharynx dry.  PULMONARY:  Clear to auscultation bilaterally.  CARDIAC:  Regular.  ABDOMEN:  Soft, nonacute.  EXTREMITIES:  No edema.   ANCILLARY DATA:  KUB is negative.  Urinalysis negative.  Gastric occult  blood is positive.  White count 13.6, platelet count 296, hemoglobin 13,000.  Sodium 135, potassium 3.7, chloride 104, bicarbonate 21, BUN 25, creatinine  1.7, glucose 148.   ASSESSMENT:  This is an elderly female with multiple medical problems being  admitted with mild gastroenteritis with nausea, vomiting, diarrhea,  dehydration.   PLAN:  1.  Will admit for 23-hour observation.  She says her nausea is some better      this a.m.  Diarrhea also mildly better.  Continue conservative      management.  Because gastric occult positive, will add Protonix and      check C. difficile because of the diarrhea.  Though features concerning      for pancreatitis, abdominal catastrophe, her abdomen is completely      benign.  2.  Hypotension.  Will hold blood pressure medicines, continue intravenous      fluids.  3.  Hyperlipidemia.  Hold medications.  4.  Anxiety.  Xanax  p.r.n.  5.  History of CVA on Aggrenox.  CBC fine.  Neurologic examination per      normal.  6.  Hypothyroidism.  Will continue Synthroid.  7.  Patient is on 24-hour observation.  Hopefully can go home tomorrow      morning.      JMR/MEDQ  D:  11/11/2004  T:  11/11/2004  Job:  [id number]

## 2011-01-27 NOTE — Consult Note (Signed)
[facility]  Patient:    [name], [name] Visit Number: [id number] MRN: [id number]          Service Type: MED Location: [id number] Attending Physician:  Gwen Pounds Dictated by:   Kelli Hope, M.D. Proc. Date: 10/23/01 Admit Date:  10/22/2001                            Consultation Report  NEUROLOGY CONSULTATION  DATE OF BIRTH:  March 17, 1929.  REASON FOR EVALUATION:  Headache and dizziness.  HISTORY OF PRESENT ILLNESS:  This is the initial inpatient consultation evaluation of this 77 year old right handed woman admitted to the hospital by Dr. Timothy Lasso on 10/22/01 for the above problems.  The patient reports that for about the past month she has been experiencing a headache which is an unusual thing for her.  She describes the headache as a "pressure sensation", mostly in the occipital areas and radiating some over the crown of the head into the frontal areas of the forehead.  She has taken Tylenol without much relief. For about the past week or so she has also had a dizzy sensation.  She describes this as "herself spinning".  She cant really tell which way but she does feel it is vertiginous rather than giddiness or light headedness.  This has not caused her to have any falls.  Over the past month her blood pressure has been going up and on two occasions in the past week it has been noted to be quite elevated in her doctors office in the 220-230s systolic range.  She says she has never had blood pressures like this before.  She was started on hydrochlorothiazide about one month ago and on 10/21/01, after her blood pressure was again very high, she was started on Altace.  She says when she gets the severe headaches she will feel somewhat nauseous and that the headache will take on a pounding throbbing quality, however, she has no associated slurring of speech, vision changes or any weakness or numbness of her arms or legs.  The patient was  admitted for further evaluation of this problem and has undergone CT scan with and without contrast which is reported to be unremarkable.  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  FAMILY HISTORY:  Remarkable for a sister who expired of lung cancer.  SOCIAL HISTORY:  Patient lives with her son and his wife and their one-year-old child.  She is normally independent in her activities of daily living but does not drive very often.  She does not smoke or use alcohol.  REVIEW OF SYSTEMS:  The patient did have some diarrhea times three on the morning of admission.  She denies any associated vision changes, diaphoresis, shortness of breath, chest pain, otherwise per history of present illness and admission history and physical report.  ALLERGIES:  No known drug allergies.  MEDICATIONS:  Prior to admission the patient was taking Synthroid, aspirin, hydrochlorothiazide, Lipitor and recently was started on Altace as above.  She was also on Zoloft but that is being held.  She takes Xanax p.r.n. but has not really taken this in about one year.  PHYSICAL EXAMINATION:  VITAL SIGNS:  Temperature 97.3, blood pressure 153/80, pulse 70, respirations 20.  Oxygen saturation 95% on  room air.  Orthostatic vital signs have been checked and she is not orthostatic.  GENERAL/MENTAL STATUS:  The patient is awake, alert, and in no evident distress.  Speech is fluent and mildly dysarthric, appropriate for being edentulous.  Mood is euthymic and affect appropriate.  NECK:  Supple with a loud left subclavian and soft right carotid bruit.  HEART:  Regular rate and rhythm without murmurs.  CRANIAL NERVES:  Funduscopic examination is benign.  Pupils equal and briskly reactive.  Extraocular movements normal  without nystagmus.  Visual fields are full to confrontation.  Face, tongue and palate move normally and symmetrically.  Motor normal.  Bulk and tone normal strength in all tested extremity muscles.  Sensation is intact to light touch.  Reflexes are 2+ and symmetric throughout although a little diminished in the ankles.  Toes are downgoing.  Finger to nose is performed well.  When she attempts to sit up she reports onset of the dizzy sensation which does not fatigue either with repetition or with prolonged sitting in position.  She does not develop nystagmus with this.  Because of her dizziness the gait examination was deferred.  LABORATORY DATA REVIEW:  CBC:  White blood cell count 8.5, hemoglobin 14.3, platelet count 251K.  CARDIAC ENZYMES:  Negative.  BMET: Unremarkable.  CT SCAN HEAD:  Reportedly has been performed and is unremarkable although this has not been reviewed.  URINALYSIS:  Patient does have a urinary tract infection.  IMPRESSION:  77 year old woman with one month history of headache associated with episodic vertiginous sensations and elevated blood pressure, etiology of this is unclear.  Possibilities include vertebral basilar insufficiency, symptomatic blood pressure spikes, possibly even seizure equivalent.  RECOMMENDATIONS:  Agree with plans for magnetic resonance imaging and MRA. Will proceed with examination of the neck vessels as well as the head vessels on the MRA.  Further work-up pending the outcome of that.  She may need lumbar puncture and work-up for paradoxical blood pressure spikes including possibly an MRA of the renal arteries, urine for metanephrines, etc.  Will follow the patient with you.  Thank you for this consultation. Dictated by:   Kelli Hope, M.D. Attending Physician:  Gwen Pounds DD:  10/23/01 TD:  10/23/01 Job: [id number]

## 2011-01-27 NOTE — H&P (Signed)
NAME:  [name], [name] NO.:  [id number]   MEDICAL RECORD NO.:  [id number]                   PATIENT TYPE:  INP   LOCATION:  5703                                 FACILITY:  [facility]   PHYSICIAN:  Gwen Pounds, M.D.                 DATE OF BIRTH:  November 25, 1928   DATE OF ADMISSION:  01/13/2004  DATE OF DISCHARGE:                                HISTORY & PHYSICAL   PRIMARY CARE PHYSICIAN:  Gwen Pounds, M.D.   CHIEF COMPLAINT:  Bronchitis, failure to thrive, diarrhea, ileus.   HISTORY OF PRESENT ILLNESS:  A 77 year old female with diabetes mellitus  type 2, hypothyroidism, hypertension, history of CVA, went to the ED  yesterday with cough, abdominal pain, and fever.  She had a diagnosis of  bronchitis and treated with Z-Pak and Tensilon Perles.  Overnight,  temperature was 103+, cough, and every cough caused diarrhea.  We will admit  for further evaluation and treatment.  She currently is miserable with  cough, fever, and diarrhea and nontoxic appearing.   PAST MEDICAL HISTORY:  1. Diabetes.  2. Hypothyroidism.  3. Hypertension.  4. Hyperlipidemia.  5. 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  6. Osteoporosis.  7. Cholecystectomy in 2000.  8. CVA.   SOCIAL HISTORY:  Widowed.  Three children.  Retired.  Quit tobacco in 1994.   FAMILY HISTORY:  Sister with lung cancer.   ALLERGIES:  No known drug allergies.   MEDICATIONS:  1. Synthroid 150 mcg accept Sunday.  2. Lipitor 20 mg.  3. Xanax 0.25 mg b.i.d.  4. Hydrochlorothiazide 25 mg q.d.  5. Norvasc 10 mg q.d.  6. Aggrenox b.i.d.  7. K-Dur 20 mEq q.d.  8. Miacalcin.   REVIEW OF SYMPTOMS:  Fevers, rigors, GI issues as above.  No chest pain and  no shortness of breath.  Progressing well on physical therapy and her  balance.  All other organs systems reviewed and are negative.   PHYSICAL EXAMINATION:  VITAL SIGNS:  Temperature 100.8, blood pressure  110/50, heart rate 111.  Saturating 95% on room air.   Respiratory rate 20.  GENERAL:  Alert and oriented x 3.  HEENT:  PERRL.  EOMI.  Oropharynx is very dry.  CARDIOVASCULAR:  Regular and tachycardic.  LUNGS:  Clear to auscultation bilaterally.  Coughing.  ABDOMEN:  Soft, nontender, and nondistended.  Bowel sounds positive x 4.  EXTREMITIES:  No clubbing, cyanosis, or edema.   LABORATORY DATA:  Ancillary urinalysis is negative.  Numbers from yesterday  show a sodium of 137, potassium 3.2, chloride 100, bicarbonate 26, BUN 10,  creatinine 1.0, glucose 149.  White count 15.1, hemoglobin 13.8, platelet  count 268,000.  LFTs fine.  Total bilirubin of 1.6, AST of 43.   Acute abdominal series show heart and lungs normal, a dynamic ileus.  Today,  her white count has jumped to 21.4 thousand with left  shift.  Hemoglobin  12.9.  BMET is about the same.  LFTs are within normal limits.   ASSESSMENT:  This is an elderly female with multiple medical problems, being  admitted with bronchitis, leukocytosis with left shift, fever, failure to  thrive, ileus, and diarrhea.  Probable viral etiology but need to cover  broad-spectrum antibiotics due to how high her white count is and the fact  that she is sick-appearing, although not toxic.   PLAN:  1. Admit.  2. Broad antibiotics.  3. Follow up on acute abdominal series.  4. Recheck labs.  5. Bowel arrest.  6. Oxygen nebulizer.  7. Symptom control.  8. Blood cultures.  9. Follow labs.                                                Gwen Pounds, M.D.    JMR/MEDQ  D:  01/13/2004  T:  01/13/2004  Job:  [id number]

## 2011-01-27 NOTE — Assessment & Plan Note (Signed)
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  The patient has continued with the outpatient therapy and  the balance program at [facility].  She has made good  strides.  She is now walking with her rolling walker and occasionally  walking without.  She has another month of therapy scheduled.  The patient  denies any vertigo.  No headaches or visual problems.  She is having normal  bowel and bladder function.  Her mood has been excellent.  She is able to do  some low level house chores as well at this point.  She does complain of  being fatigued, especially toward the mid day afternoon.  When she is  fatigued, her balance worsens.   REVIEW OF SYMPTOMS:  The patient denies any chest pain, shortness of breath,  no wheezing or coughing.  She has had some occasional problem with sleep.  She denies anxiety, depression, no weakness, numbness, dizziness, spasms or  confusion.  She had no nausea, vomiting, constipation, bowel or bladder  incontinence.  She denies any fever, chills, weight loss or skin breakdown.  She has had lab work checked recently by Dr. Timothy Lasso which was essentially  negative.   PHYSICAL EXAMINATION:  GENERAL APPEARANCE:  The patient is pleasant, in no  acute distress.  VITAL SIGNS:  Blood pressure 145/49, pulse 93, respiratory rate 16,  she is  saturating 95% on room air.  NEUROLOGIC:  The patient is alert and appropriate.  Cognitively, she is  intact.  Cranial nerve examination was unremarkable today.  Motor  examination was 5/5 throughout upper and lower extremities.  She had normal  sensation.  She had some minimal loss of fine motor coordination.  She  walked without her walker today and took extra time at transfer.  She did  well until she changed directions and stumbled to the left.  When she had  her rolling walker, she was very stable and steady today.   ASSESSMENT:  1. Status post right medullary  stroke.  2. Hypertension.  3. Hypothyroidism.   PLAN:  1. The patient will continue with her outpatient therapies to completion.  2. She will continue on Aggrenox for stroke prophylaxis.  I filled out     paperwork for a drug assistance program today.  3. I encouraged her regarding her endurance.  This is probably stroke     related.  It appears that Dr. Timothy Lasso has checked appropriate lab work to     rule out hypothyroidism or metabolic issues.  With some time her     endurance should improve.  Until then, encouraged extra time and rest     breaks as needed.  4. I will see the patient back in three months' time.      Ranelle Oyster, M.D.   ZTS/MedQ  D:  11/16/2003 13:25:00  T:  11/16/2003 15:07:04  Job #:  [id number]   cc:   Gwen Pounds, M.D.  [address]  [place]  [place] [address]  Fax: [phone]

## 2011-01-27 NOTE — Assessment & Plan Note (Signed)
[name] is back regarding her right medullary infarct and subsequent gait  imbalance deficits. She was a patient of mine on the subacute rehab unit and  was discharged home on August 04, 2003 with 24-hour supervision. She has  completed a course of home health physical therapy which focused on  therapeutic gait and lower extremity strengthening and balance. The patient  has continued to exhibit a lean to the left and tendency to fall to the left  quickly if not compensated for. The patient has had some near falls with  therapy but has not had any falls by her report at home. She is primarily  using the wheelchair for mobility at the house and only is using the walker  when supervised. The patient denies any headaches, visual complaints. No  nausea, vomiting, diarrhea. Bowel and bladder function has been normal. Mood  has been good. Appetite has been reasonable. She is sleeping well.   PHYSICAL EXAMINATION:  The patient is pleasant in no acute distress. Blood  pressure 152/61, pulse 82. She is saturating 100% on room air. Upper  extremities reveal 4+ to 5/5 strength with fairly normal coordination,  slightly decreased on the left with finger to nose movement, but this was  minimal. Sensory exam was intact. Range of motion in the shoulders, elbows,  and wrists was normal. Reflexes were 2+. Lower extremity strength was all 4+  to 5/5 with normal reflexes and sensation. The patient did stand for me  today to take a few steps with my support and had a significant tendency to  fall to the left side unprotectively. Cranial nerve exam II-XII was grossly  intact today. Examination of cognition was very appropriate.   ASSESSMENT:  1. Status post right medullary stroke.  2. Hypertension.  3. History of hypothyroidism.   PLAN:  1. I would like to transition patient to outpatient therapy and balance     program at [facility] outpatient rehab. I think she could significantly     benefit from  this.  2. She will continue on her Aggrenox for stroke prophylaxis.  3. She will continue her other medicines per Dr. Timothy Lasso.  4. I will see the patient back in approximately two months' time.      Ranelle Oyster, M.D.   ZTS/MedQ  D:  09/15/2003 14:39:19  T:  09/15/2003 15:00:58  Job #:  [id number]   cc:   Gwen Pounds, M.D.  [address]  [place]  [place] [address]  Fax: [phone]

## 2011-09-27 ENCOUNTER — Other Ambulatory Visit: Payer: Self-pay

## 2011-09-27 ENCOUNTER — Emergency Department (HOSPITAL_COMMUNITY): Payer: Medicare Other

## 2011-09-27 ENCOUNTER — Encounter (HOSPITAL_COMMUNITY): Payer: Self-pay | Admitting: Emergency Medicine

## 2011-09-27 ENCOUNTER — Emergency Department (HOSPITAL_COMMUNITY)
Admission: EM | Admit: 2011-09-27 | Discharge: 2011-09-28 | Disposition: A | Discharge status: Home / Self Care | Payer: Medicare Other | Attending: Emergency Medicine | Admitting: Emergency Medicine

## 2011-09-27 DIAGNOSIS — I10 Essential (primary) hypertension: Secondary | ICD-10-CM | POA: Insufficient documentation

## 2011-09-27 DIAGNOSIS — Z8679 Personal history of other diseases of the circulatory system: Secondary | ICD-10-CM | POA: Insufficient documentation

## 2011-09-27 DIAGNOSIS — M79609 Pain in unspecified limb: Secondary | ICD-10-CM | POA: Insufficient documentation

## 2011-09-27 DIAGNOSIS — E119 Type 2 diabetes mellitus without complications: Secondary | ICD-10-CM | POA: Insufficient documentation

## 2011-09-27 DIAGNOSIS — M5412 Radiculopathy, cervical region: Secondary | ICD-10-CM | POA: Insufficient documentation

## 2011-09-27 DIAGNOSIS — M25519 Pain in unspecified shoulder: Secondary | ICD-10-CM | POA: Diagnosis not present

## 2011-09-27 DIAGNOSIS — R209 Unspecified disturbances of skin sensation: Secondary | ICD-10-CM | POA: Diagnosis not present

## 2011-09-27 DIAGNOSIS — M5410 Radiculopathy, site unspecified: Secondary | ICD-10-CM

## 2011-09-27 DIAGNOSIS — J984 Other disorders of lung: Secondary | ICD-10-CM | POA: Diagnosis not present

## 2011-09-27 DIAGNOSIS — M47812 Spondylosis without myelopathy or radiculopathy, cervical region: Secondary | ICD-10-CM | POA: Diagnosis not present

## 2011-09-27 DIAGNOSIS — I6789 Other cerebrovascular disease: Secondary | ICD-10-CM | POA: Diagnosis not present

## 2011-09-27 DIAGNOSIS — C9591 Leukemia, unspecified, in remission: Secondary | ICD-10-CM | POA: Insufficient documentation

## 2011-09-27 HISTORY — DX: Leukemia, unspecified not having achieved remission: C95.90

## 2011-09-27 HISTORY — DX: Cerebral infarction, unspecified: I63.9

## 2011-09-27 HISTORY — DX: Essential (primary) hypertension: I10

## 2011-09-27 HISTORY — DX: Mononeuropathy, unspecified: G58.9

## 2011-09-27 LAB — POCT I-STAT, CHEM 8
BUN: 16 mg/dL (ref 6–23)
Creatinine, Ser: 0.8 mg/dL (ref 0.50–1.10)
Potassium: 3.7 mEq/L (ref 3.5–5.1)
Sodium: 140 mEq/L (ref 135–145)

## 2011-09-27 LAB — POCT I-STAT TROPONIN I

## 2011-09-27 LAB — DIFFERENTIAL
Eosinophils Relative: 1 % (ref 0–5)
Lymphocytes Relative: 23 % (ref 12–46)
Lymphs Abs: 1.7 10*3/uL (ref 0.7–4.0)

## 2011-09-27 LAB — CBC
Hemoglobin: 12.5 g/dL (ref 12.0–15.0)
MCV: 91.5 fL (ref 78.0–100.0)
Platelets: 283 10*3/uL (ref 150–400)
RBC: 4.13 MIL/uL (ref 3.87–5.11)
WBC: 7.5 10*3/uL (ref 4.0–10.5)

## 2011-09-27 MED ORDER — ASPIRIN 81 MG PO CHEW
324.0000 mg | CHEWABLE_TABLET | Freq: Once | ORAL | Status: AC
  Administered 2011-09-28: 324 mg via ORAL
  Filled 2011-09-27: qty 4

## 2011-09-27 MED ORDER — FENTANYL CITRATE 0.05 MG/ML IJ SOLN
50.0000 ug | Freq: Once | INTRAMUSCULAR | Status: AC
  Administered 2011-09-28: 50 ug via INTRAVENOUS
  Filled 2011-09-27: qty 2

## 2011-09-27 NOTE — ED Provider Notes (Signed)
History     CSN: [id number]  Arrival date & time 09/27/11  2210   First MD Initiated Contact with Patient 09/27/11 2307      Chief Complaint  Patient presents with  . Arm Pain    L arm    (Consider location/radiation/quality/duration/timing/severity/associated sxs/prior treatment) Patient is a 78 y.o. female presenting with arm pain. The history is provided by the patient. No language interpreter was used.  Arm Pain This is a recurrent problem. The current episode started more than 2 days ago. The problem occurs hourly. The problem has been gradually worsening. Pertinent negatives include no chest pain, no abdominal pain, no headaches and no shortness of breath. The symptoms are aggravated by nothing. The symptoms are relieved by nothing. She has tried acetaminophen for the symptoms. The treatment provided no relief.  No CP, no DOE no SOB episodic coming and going but lasting longer and longer.  No weakness it is throbbing.  Pain is 9/10.  No rashes on the skin.  No neck pain no weakness. No trauma.    Past Medical History  Diagnosis Date  . Hypertension   . Diabetes mellitus   . Leukemia in remission  . Stroke   . Pinched nerve In neck    Past Surgical History  Procedure Date  . Cholecystectomy   . Appendectomy     No family history on file.  History  Substance Use Topics  . Smoking status: Former Games developer  . Smokeless tobacco: Not on file  . Alcohol Use: No    OB History    Grav Para Term Preterm Abortions TAB SAB Ect Mult Living                  Review of Systems  Constitutional: Negative for activity change.  HENT: Negative for facial swelling.   Eyes: Negative for discharge.  Respiratory: Negative for cough, shortness of breath and wheezing.   Cardiovascular: Negative for chest pain, palpitations and leg swelling.  Gastrointestinal: Negative for abdominal pain and abdominal distention.  Genitourinary: Negative for difficulty urinating.  Musculoskeletal:  Positive for arthralgias. Negative for back pain.  Neurological: Negative for syncope, weakness, numbness and headaches.  Hematological: Negative.   Psychiatric/Behavioral: Negative.   All other systems reviewed and are negative.    Allergies  Prednisone  Home Medications   Current Outpatient Rx  Name Route Sig Dispense Refill  . ALPRAZOLAM 0.5 MG PO TABS Oral Take 0.5 mg by mouth 3 (three) times daily as needed. For anxiety    . CALCIUM 600 + D PO Oral Take 1 tablet by mouth daily.    Marland Kitchen VITAMIN D3 1000 UNITS PO TABS Oral Take 1,000 Units by mouth daily.    . ASPIRIN-DIPYRIDAMOLE 25-200 MG PO CP12 Oral Take 1 capsule by mouth 2 (two) times daily.    Marland Kitchen LEVOTHYROXINE SODIUM 150 MCG PO TABS Oral Take 150 mcg by mouth daily.    Marland Kitchen OLMESARTAN MEDOXOMIL-HCTZ 20-12.5 MG PO TABS Oral Take 1 tablet by mouth daily.    Marland Kitchen POTASSIUM CHLORIDE CRYS ER 20 MEQ PO TBCR Oral Take 20 mEq by mouth daily.      BP 171/73  Pulse 84  Temp(Src) 97.5 F (36.4 C) (Oral)  Resp 18  SpO2 95%  Physical Exam  Constitutional: She is oriented to person, place, and time. She appears well-developed and well-nourished.  HENT:  Head: Normocephalic and atraumatic.  Mouth/Throat: Oropharynx is clear and moist.  Eyes: Conjunctivae are normal. Pupils are equal, round,  and reactive to light.  Neck: Normal range of motion. Neck supple.  Cardiovascular: Normal rate and regular rhythm.   Pulmonary/Chest: Effort normal and breath sounds normal. She has no wheezes. She has no rales.  Abdominal: Soft. Bowel sounds are normal.  Musculoskeletal: Normal range of motion. She exhibits tenderness. She exhibits no edema.       Negative NEERS test of LUE  Neurological: She is alert and oriented to person, place, and time. She has normal strength and normal reflexes. She displays no atrophy. No sensory deficit.  Skin: Skin is warm and dry. She is not diaphoretic.  Psychiatric: Thought content normal.    ED Course  Procedures  (including critical care time)  Labs Reviewed  POCT I-STAT, CHEM 8 - Abnormal; Notable for the following:    Glucose, Bld 126 (*)    All other components within normal limits  CBC  DIFFERENTIAL  POCT I-STAT TROPONIN I  I-STAT, CHEM 8  I-STAT TROPONIN I   No results found.   No diagnosis found.    MDM   Date: 09/27/2011  Rate:83  Rhythm: normal sinus rhythm  QRS Axis: normal  Intervals: normal  ST/T Wave abnormalities: nonspecific ST changes  Conduction Disutrbances:none  Narrative Interpretation: septal infarct  Old EKG Reviewed: changes noted    Return for chest pain shortness of breath or any concerns follow up for further work up of radiculopathy with Dr. Timothy Lasso in the AM.  Patient and son verbalize understanding and agree to follow up     Aundra Pung Smitty Cords, MD 09/28/11 [phone]

## 2011-09-27 NOTE — ED Notes (Signed)
L arm pain since yesterday,denies injury. Comes and goes.states tingling in L fingers. Feels like an ache.

## 2011-09-27 NOTE — ED Notes (Signed)
Pt here with c/o sever L arm pain from shoulder to hand. States hand is tingling. Denies injury.

## 2011-09-28 ENCOUNTER — Emergency Department (HOSPITAL_COMMUNITY): Payer: Medicare Other

## 2011-09-28 DIAGNOSIS — M5412 Radiculopathy, cervical region: Secondary | ICD-10-CM | POA: Diagnosis not present

## 2011-09-28 DIAGNOSIS — I10 Essential (primary) hypertension: Secondary | ICD-10-CM | POA: Diagnosis not present

## 2011-09-28 DIAGNOSIS — M542 Cervicalgia: Secondary | ICD-10-CM | POA: Diagnosis not present

## 2011-09-28 DIAGNOSIS — M19019 Primary osteoarthritis, unspecified shoulder: Secondary | ICD-10-CM | POA: Diagnosis not present

## 2011-09-28 DIAGNOSIS — F411 Generalized anxiety disorder: Secondary | ICD-10-CM | POA: Diagnosis not present

## 2011-09-28 LAB — POCT I-STAT TROPONIN I: Troponin i, poc: 0 ng/mL (ref 0.00–0.08)

## 2011-09-28 MED ORDER — KETOROLAC TROMETHAMINE 30 MG/ML IJ SOLN
30.0000 mg | Freq: Once | INTRAMUSCULAR | Status: AC
  Administered 2011-09-28: 30 mg via INTRAVENOUS
  Filled 2011-09-28: qty 1

## 2011-09-28 MED ORDER — HYDROCODONE-ACETAMINOPHEN 5-500 MG PO TABS: 2.0000 | ORAL_TABLET | Freq: Four times a day (QID) | ORAL | Status: AC | PRN

## 2011-09-28 MED ORDER — METHOCARBAMOL 500 MG PO TABS: 500.0000 mg | ORAL_TABLET | Freq: Two times a day (BID) | ORAL | Status: AC

## 2011-09-28 NOTE — ED Notes (Signed)
rx x 2, pt voiced understanding to call Dr. Timothy Lasso tomorrow for follow up.

## 2011-12-12 DIAGNOSIS — M19019 Primary osteoarthritis, unspecified shoulder: Secondary | ICD-10-CM | POA: Diagnosis not present

## 2012-01-01 DIAGNOSIS — I10 Essential (primary) hypertension: Secondary | ICD-10-CM | POA: Diagnosis not present

## 2012-01-01 DIAGNOSIS — E119 Type 2 diabetes mellitus without complications: Secondary | ICD-10-CM | POA: Diagnosis not present

## 2012-01-01 DIAGNOSIS — E039 Hypothyroidism, unspecified: Secondary | ICD-10-CM | POA: Diagnosis not present

## 2012-01-01 DIAGNOSIS — E785 Hyperlipidemia, unspecified: Secondary | ICD-10-CM | POA: Diagnosis not present

## 2012-01-10 DIAGNOSIS — M19019 Primary osteoarthritis, unspecified shoulder: Secondary | ICD-10-CM | POA: Diagnosis not present

## 2012-04-17 DIAGNOSIS — M899 Disorder of bone, unspecified: Secondary | ICD-10-CM | POA: Diagnosis not present

## 2012-06-17 DIAGNOSIS — R110 Nausea: Secondary | ICD-10-CM | POA: Diagnosis not present

## 2012-06-17 DIAGNOSIS — R05 Cough: Secondary | ICD-10-CM | POA: Diagnosis not present

## 2012-06-17 DIAGNOSIS — J209 Acute bronchitis, unspecified: Secondary | ICD-10-CM | POA: Diagnosis not present

## 2012-06-17 DIAGNOSIS — E119 Type 2 diabetes mellitus without complications: Secondary | ICD-10-CM | POA: Diagnosis not present

## 2012-07-02 DIAGNOSIS — I10 Essential (primary) hypertension: Secondary | ICD-10-CM | POA: Diagnosis not present

## 2012-07-02 DIAGNOSIS — E119 Type 2 diabetes mellitus without complications: Secondary | ICD-10-CM | POA: Diagnosis not present

## 2012-07-02 DIAGNOSIS — D509 Iron deficiency anemia, unspecified: Secondary | ICD-10-CM | POA: Diagnosis not present

## 2012-07-02 DIAGNOSIS — Z23 Encounter for immunization: Secondary | ICD-10-CM | POA: Diagnosis not present

## 2012-07-02 DIAGNOSIS — E785 Hyperlipidemia, unspecified: Secondary | ICD-10-CM | POA: Diagnosis not present

## 2012-07-02 DIAGNOSIS — E039 Hypothyroidism, unspecified: Secondary | ICD-10-CM | POA: Diagnosis not present

## 2012-07-09 DIAGNOSIS — I10 Essential (primary) hypertension: Secondary | ICD-10-CM | POA: Diagnosis not present

## 2012-07-30 DIAGNOSIS — I10 Essential (primary) hypertension: Secondary | ICD-10-CM | POA: Diagnosis not present

## 2013-01-06 DIAGNOSIS — Z23 Encounter for immunization: Secondary | ICD-10-CM | POA: Diagnosis not present

## 2013-01-06 DIAGNOSIS — E039 Hypothyroidism, unspecified: Secondary | ICD-10-CM | POA: Diagnosis not present

## 2013-01-06 DIAGNOSIS — M5412 Radiculopathy, cervical region: Secondary | ICD-10-CM | POA: Diagnosis not present

## 2013-01-06 DIAGNOSIS — D509 Iron deficiency anemia, unspecified: Secondary | ICD-10-CM | POA: Diagnosis not present

## 2013-01-06 DIAGNOSIS — E1169 Type 2 diabetes mellitus with other specified complication: Secondary | ICD-10-CM | POA: Diagnosis not present

## 2013-01-06 DIAGNOSIS — Z1331 Encounter for screening for depression: Secondary | ICD-10-CM | POA: Diagnosis not present

## 2013-01-06 DIAGNOSIS — R627 Adult failure to thrive: Secondary | ICD-10-CM | POA: Diagnosis not present

## 2013-01-06 DIAGNOSIS — I10 Essential (primary) hypertension: Secondary | ICD-10-CM | POA: Diagnosis not present

## 2013-01-06 DIAGNOSIS — E785 Hyperlipidemia, unspecified: Secondary | ICD-10-CM | POA: Diagnosis not present

## 2013-01-06 DIAGNOSIS — I6350 Cerebral infarction due to unspecified occlusion or stenosis of unspecified cerebral artery: Secondary | ICD-10-CM | POA: Diagnosis not present

## 2013-01-06 DIAGNOSIS — M810 Age-related osteoporosis without current pathological fracture: Secondary | ICD-10-CM | POA: Diagnosis not present

## 2013-01-06 DIAGNOSIS — F411 Generalized anxiety disorder: Secondary | ICD-10-CM | POA: Diagnosis not present

## 2013-01-16 DIAGNOSIS — Z1212 Encounter for screening for malignant neoplasm of rectum: Secondary | ICD-10-CM | POA: Diagnosis not present

## 2013-01-21 ENCOUNTER — Encounter: Payer: Self-pay | Admitting: Gastroenterology

## 2013-01-22 ENCOUNTER — Telehealth: Payer: Self-pay | Admitting: Gastroenterology

## 2013-01-22 NOTE — Telephone Encounter (Signed)
Patient has a Hgb of 11.3 and is asymptomatic according to [place].  She has no information about iron studies, the labs we have do show heme positive stools.  I have notified Malachi Bonds we will place her on the cancellation list, but for now will leave the appt for 02/07/13. She is asked to call back if she finds any additional information or reason the patient needs to be worked in urgently.  Malachi Bonds states she will call me back if she uncovers more info.

## 2013-01-29 DIAGNOSIS — D509 Iron deficiency anemia, unspecified: Secondary | ICD-10-CM | POA: Diagnosis not present

## 2013-02-07 ENCOUNTER — Ambulatory Visit (INDEPENDENT_AMBULATORY_CARE_PROVIDER_SITE_OTHER): Payer: Medicare Other | Admitting: Gastroenterology

## 2013-02-07 ENCOUNTER — Encounter: Payer: Self-pay | Admitting: Gastroenterology

## 2013-02-07 VITALS — BP 160/60 | HR 88 | Ht 60.0 in | Wt 160.0 lb

## 2013-02-07 DIAGNOSIS — D509 Iron deficiency anemia, unspecified: Secondary | ICD-10-CM | POA: Diagnosis not present

## 2013-02-07 DIAGNOSIS — Z86010 Personal history of colonic polyps: Secondary | ICD-10-CM | POA: Diagnosis not present

## 2013-02-07 DIAGNOSIS — D649 Anemia, unspecified: Secondary | ICD-10-CM | POA: Insufficient documentation

## 2013-02-07 DIAGNOSIS — R195 Other fecal abnormalities: Secondary | ICD-10-CM | POA: Diagnosis not present

## 2013-02-07 MED ORDER — PEG-KCL-NACL-NASULF-NA ASC-C 100 G PO SOLR: 1.0000 | Freq: Once | ORAL | Status: DC

## 2013-02-07 NOTE — Progress Notes (Signed)
History of Present Illness: This is an 79-year-old female accompanied by her son. She was recently found to have an iron deficiency anemia with hemosure positive stool. Hemoglobin=9.6, iron saturation=8%. She had a prior upper endoscopy in 1999 that was normal and a colonoscopy in 2005 that showed adenomatous colon polyps. Denies weight loss, abdominal pain, constipation, diarrhea, change in stool caliber, melena, hematochezia, nausea, vomiting, dysphagia, reflux symptoms, chest pain.  Review of Systems: Pertinent positive and negative review of systems were noted in the above HPI section. All other review of systems were otherwise negative.  Current Medications, Allergies, Past Medical History, Past Surgical History, Family History and Social History were reviewed in [facility] electronic medical record.  Physical Exam: General: Well developed , well nourished, no acute distress Head: Normocephalic and atraumatic Eyes:  sclerae anicteric, EOMI Ears: Normal auditory acuity Mouth: No deformity or lesions Neck: Supple, no masses or thyromegaly Lungs: Clear throughout to auscultation Heart: Regular rate and rhythm; no murmurs, rubs or bruits Abdomen: Soft, non tender and non distended. No masses, hepatosplenomegaly or hernias noted. Normal Bowel sounds Rectal: Deferred to colonoscopy Musculoskeletal: Symmetrical with no gross deformities  Skin: No lesions on visible extremities Pulses:  Normal pulses noted Extremities: No clubbing, cyanosis, edema or deformities noted Neurological: Alert oriented x 4, grossly nonfocal Cervical Nodes:  No significant cervical adenopathy Inguinal Nodes: No significant inguinal adenopathy Psychological:  Alert and cooperative. Normal mood and affect  Assessment and Recommendations:  1. Iron deficiency anemia, Hemoccult positive stool, personal history of adenomatous colon polyps. Increase iron to twice a day. Rule out colorectal neoplasms, AVMs, ulcers,  etc. Schedule colonoscopy and upper endoscopy. The risks, benefits, and alternatives to colonoscopy with possible biopsy and possible polypectomy were discussed with the patient and they consent to proceed. The risks, benefits, and alternatives to endoscopy with possible biopsy and possible dilation were discussed with the patient and they consent to proceed.     

## 2013-02-07 NOTE — Patient Instructions (Addendum)
Increase your Iron to one tablet by mouth twice daily.   You have been scheduled for an endoscopy and colonoscopy. Please follow the written instructions given to you at your visit today. Please pick up your prep at the pharmacy within the next 1-3 days. If you use inhalers (even only as needed), please bring them with you on the day of your procedure. Your physician has requested that you go to www.startemmi.com and enter the access code given to you at your visit today. This web site gives a general overview about your procedure. However, you should still follow specific instructions given to you by our office regarding your preparation for the procedure.  Thank you for choosing me and [facility].  Venita Lick. Pleas Koch., MD., Clementeen Graham  cc: Creola Corn, MD

## 2013-02-13 ENCOUNTER — Encounter: Payer: Self-pay | Admitting: Gastroenterology

## 2013-02-14 DIAGNOSIS — D509 Iron deficiency anemia, unspecified: Secondary | ICD-10-CM | POA: Diagnosis not present

## 2013-02-24 ENCOUNTER — Ambulatory Visit (HOSPITAL_COMMUNITY)
Admission: RE | Admit: 2013-02-24 | Discharge: 2013-02-24 | Disposition: A | Discharge status: Home / Self Care | Payer: Medicare Other | Source: Ambulatory Visit | Attending: Gastroenterology | Admitting: Gastroenterology

## 2013-02-24 ENCOUNTER — Encounter (HOSPITAL_COMMUNITY): Payer: Self-pay | Admitting: *Deleted

## 2013-02-24 ENCOUNTER — Encounter (HOSPITAL_COMMUNITY)
Admission: RE | Disposition: A | Discharge status: Home / Self Care | Payer: Self-pay | Source: Ambulatory Visit | Attending: Gastroenterology

## 2013-02-24 DIAGNOSIS — Z860100 Personal history of colon polyps, unspecified: Secondary | ICD-10-CM

## 2013-02-24 DIAGNOSIS — K31819 Angiodysplasia of stomach and duodenum without bleeding: Secondary | ICD-10-CM | POA: Insufficient documentation

## 2013-02-24 DIAGNOSIS — R195 Other fecal abnormalities: Secondary | ICD-10-CM

## 2013-02-24 DIAGNOSIS — K449 Diaphragmatic hernia without obstruction or gangrene: Secondary | ICD-10-CM | POA: Diagnosis not present

## 2013-02-24 DIAGNOSIS — D509 Iron deficiency anemia, unspecified: Secondary | ICD-10-CM | POA: Diagnosis not present

## 2013-02-24 DIAGNOSIS — K5520 Angiodysplasia of colon without hemorrhage: Secondary | ICD-10-CM | POA: Diagnosis not present

## 2013-02-24 HISTORY — PX: COLONOSCOPY: SHX5424

## 2013-02-24 HISTORY — PX: ESOPHAGOGASTRODUODENOSCOPY: SHX5428

## 2013-02-24 SURGERY — COLONOSCOPY
Anesthesia: Moderate Sedation

## 2013-02-24 MED ORDER — FENTANYL CITRATE 0.05 MG/ML IJ SOLN
INTRAMUSCULAR | Status: DC | PRN
  Administered 2013-02-24 (×5): 25 ug via INTRAVENOUS

## 2013-02-24 MED ORDER — MIDAZOLAM HCL 10 MG/2ML IJ SOLN
INTRAMUSCULAR | Status: DC | PRN
  Administered 2013-02-24 (×3): 2 mg via INTRAVENOUS
  Administered 2013-02-24: 1 mg via INTRAVENOUS
  Administered 2013-02-24: 2 mg via INTRAVENOUS
  Administered 2013-02-24: 1 mg via INTRAVENOUS
  Administered 2013-02-24: 2 mg via INTRAVENOUS

## 2013-02-24 MED ORDER — FENTANYL CITRATE 0.05 MG/ML IJ SOLN
INTRAMUSCULAR | Status: AC
  Filled 2013-02-24: qty 2

## 2013-02-24 MED ORDER — SODIUM CHLORIDE 0.9 % IV SOLN
INTRAVENOUS | Status: DC
  Administered 2013-02-24: 500 mL via INTRAVENOUS

## 2013-02-24 MED ORDER — MIDAZOLAM HCL 10 MG/2ML IJ SOLN
INTRAMUSCULAR | Status: AC
  Filled 2013-02-24: qty 2

## 2013-02-24 MED ORDER — MIDAZOLAM HCL 10 MG/2ML IJ SOLN
INTRAMUSCULAR | Status: AC
  Filled 2013-02-24: qty 4

## 2013-02-24 NOTE — Interval H&P Note (Signed)
History and Physical Interval Note:  02/24/2013 7:53 AM  [name]  has presented today for surgery, with the diagnosis of Iron deficiency anemia, unspecified [280.9] Nonspecific abnormal finding in stool contents [792.1]  The various methods of treatment have been discussed with the patient and family. After consideration of risks, benefits and other options for treatment, the patient has consented to  Procedure(s): COLONOSCOPY (N/A) ESOPHAGOGASTRODUODENOSCOPY (EGD) (N/A) as a surgical intervention .  The patient's history has been reviewed, patient examined, no change in status, stable for surgery.  I have reviewed the patient's chart and labs.  Questions were answered to the patient's satisfaction.     Venita Lick. Russella Dar MD

## 2013-02-24 NOTE — Op Note (Addendum)
[facility] [address] [place], [address]   COLONOSCOPY PROCEDURE REPORT  PATIENT: [name], [name].  MR#: [id number] BIRTHDATE: 1929/09/01 , 83  yrs. old GENDER: Female ENDOSCOPIST: Meryl Dare, MD, [facility] PROCEDURE DATE:  02/24/2013 PROCEDURE:   Colonoscopy, diagnostic ASA CLASS:   Class II INDICATIONS:heme-positive stool, Iron Deficiency Anemia, and Patient's personal history of adenomatous colon polyps. MEDICATIONS: These medications were titrated to patient response per physician's verbal order, Fentanyl 100 mcg IV, and Versed 10 mg IV  DESCRIPTION OF PROCEDURE:   After the risks benefits and alternatives of the procedure were thoroughly explained, informed consent was obtained.  A digital rectal exam revealed no abnormalities of the rectum.   The Pentax Ped Colon [id number] endoscope was introduced through the anus and advanced to the cecum, which was identified by both the appendix and ileocecal valve. No adverse events experienced with a tortuous and redundant colon.   The quality of the prep was excellent, using MoviPrep  The instrument was then slowly withdrawn as the colon was fully examined.  COLON FINDINGS: A 5mm AVM was noted in the ascending colon.  No bleeding was noted.  The colon was otherwise normal.  There was no diverticulosis, inflammation, polyps or cancers unless previously stated.  Retroflexed views revealed no abnormalities. The time to cecum=10 minutes 00 seconds.  Withdrawal time=8 minutes 00 seconds. The scope was withdrawn and the procedure completed.  COMPLICATIONS: There were no complications.  ENDOSCOPIC IMPRESSION: 1.   AVM in the ascending colon 2.   The colon was otherwise normal  RECOMMENDATIONS: 1.  Fe def and heme + stool due to AVM. EGD today to complete the evaluation. 2. Given your age, you will not need another colonoscopy for colon cancer screening or polyp surveillance.  These types of tests usually stop  around the age 78.   eSigned:  Meryl Dare, MD, [facility] 02/24/2013 9:18 AM Revised: 02/24/2013 9:18 AM  cc: Creola Corn, MD

## 2013-02-24 NOTE — H&P (View-Only) (Signed)
History of Present Illness: This is an 79 year old female accompanied by her son. She was recently found to have an iron deficiency anemia with hemosure positive stool. Hemoglobin=9.6, iron saturation=8%. She had a prior upper endoscopy in 1999 that was normal and a colonoscopy in 2005 that showed adenomatous colon polyps. Denies weight loss, abdominal pain, constipation, diarrhea, change in stool caliber, melena, hematochezia, nausea, vomiting, dysphagia, reflux symptoms, chest pain.  Review of Systems: Pertinent positive and negative review of systems were noted in the above HPI section. All other review of systems were otherwise negative.  Current Medications, Allergies, Past Medical History, Past Surgical History, Family History and Social History were reviewed in [employer] record.  Physical Exam: General: Well developed , well nourished, no acute distress Head: Normocephalic and atraumatic Eyes:  sclerae anicteric, EOMI Ears: Normal auditory acuity Mouth: No deformity or lesions Neck: Supple, no masses or thyromegaly Lungs: Clear throughout to auscultation Heart: Regular rate and rhythm; no murmurs, rubs or bruits Abdomen: Soft, non tender and non distended. No masses, hepatosplenomegaly or hernias noted. Normal Bowel sounds Rectal: Deferred to colonoscopy Musculoskeletal: Symmetrical with no gross deformities  Skin: No lesions on visible extremities Pulses:  Normal pulses noted Extremities: No clubbing, cyanosis, edema or deformities noted Neurological: Alert oriented x 4, grossly nonfocal Cervical Nodes:  No significant cervical adenopathy Inguinal Nodes: No significant inguinal adenopathy Psychological:  Alert and cooperative. Normal mood and affect  Assessment and Recommendations:  1. Iron deficiency anemia, Hemoccult positive stool, personal history of adenomatous colon polyps. Increase iron to twice a day. Rule out colorectal neoplasms, AVMs, ulcers,  etc. Schedule colonoscopy and upper endoscopy. The risks, benefits, and alternatives to colonoscopy with possible biopsy and possible polypectomy were discussed with the patient and they consent to proceed. The risks, benefits, and alternatives to endoscopy with possible biopsy and possible dilation were discussed with the patient and they consent to proceed.

## 2013-02-24 NOTE — Op Note (Addendum)
[facility] [address] [place], [address]   ENDOSCOPY PROCEDURE REPORT  PATIENT: Christina, Maxham R.  MR#: #[id number] BIRTHDATE: 1929-06-18 , 83  yrs. old GENDER: Female ENDOSCOPIST: Meryl Dare, MD, [facility] PROCEDURE DATE:  02/24/2013 PROCEDURE:  EGD, diagnostic ASA CLASS:     Class II INDICATIONS:  Iron deficiency anemia.   Occult blood positive. MEDICATIONS: There was residual sedation effect present from prior procedure, medications were titrated to patient response per physician's verbal order, Fentanyl 25 mcg IV, and Versed 2 mg IV TOPICAL ANESTHETIC: Cetacaine Spray DESCRIPTION OF PROCEDURE: After the risks benefits and alternatives of the procedure were thoroughly explained, informed consent was obtained.  The Pentax Gastroscope [id number] endoscope was introduced through the mouth and advanced to the second portion of the duodenum  without limitations.  The instrument was slowly withdrawn as the mucosa was fully examined.  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   The duodenal mucosa showed no abnormalities in the 2nd part of the duodenum. STOMACH: The mucosa and folds of the stomach appeared normal. ESOPHAGUS: The mucosa of the esophagus appeared normal.  Retroflexed views revealed a 4 cm hiatal hernia.     The scope was then withdrawn from the patient and the procedure completed.  COMPLICATIONS: There were no complications.  ENDOSCOPIC IMPRESSION: 1.   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   4 cm hiatal hernia  RECOMMENDATIONS: 1.  Fe def and heme + stool from AVMs. Fe replacement and anticipate persistent heme + stool and recurrent fe deficiency. 2.  OP follow-up with Dr. Timothy Lasso   eSigned:  Meryl Dare, MD, [facility] 02/24/2013 9:17 AM Revised: 02/24/2013 9:17 AM  XB:JYNW Timothy Lasso, MD

## 2013-02-26 ENCOUNTER — Encounter (HOSPITAL_COMMUNITY): Payer: Self-pay | Admitting: Gastroenterology

## 2013-03-07 ENCOUNTER — Encounter: Payer: Self-pay | Admitting: Gastroenterology

## 2013-03-10 DIAGNOSIS — I10 Essential (primary) hypertension: Secondary | ICD-10-CM | POA: Diagnosis not present

## 2013-03-27 DIAGNOSIS — E1169 Type 2 diabetes mellitus with other specified complication: Secondary | ICD-10-CM | POA: Diagnosis not present

## 2013-03-27 DIAGNOSIS — I10 Essential (primary) hypertension: Secondary | ICD-10-CM | POA: Diagnosis not present

## 2013-03-27 DIAGNOSIS — D509 Iron deficiency anemia, unspecified: Secondary | ICD-10-CM | POA: Diagnosis not present

## 2013-03-27 DIAGNOSIS — E785 Hyperlipidemia, unspecified: Secondary | ICD-10-CM | POA: Diagnosis not present

## 2013-03-27 DIAGNOSIS — R05 Cough: Secondary | ICD-10-CM | POA: Diagnosis not present

## 2013-04-16 ENCOUNTER — Other Ambulatory Visit: Payer: Self-pay

## 2013-07-17 ENCOUNTER — Other Ambulatory Visit: Payer: Self-pay

## 2013-07-24 DIAGNOSIS — E785 Hyperlipidemia, unspecified: Secondary | ICD-10-CM | POA: Diagnosis not present

## 2013-07-24 DIAGNOSIS — Z6825 Body mass index (BMI) 25.0-25.9, adult: Secondary | ICD-10-CM | POA: Diagnosis not present

## 2013-07-24 DIAGNOSIS — I10 Essential (primary) hypertension: Secondary | ICD-10-CM | POA: Diagnosis not present

## 2013-07-24 DIAGNOSIS — E039 Hypothyroidism, unspecified: Secondary | ICD-10-CM | POA: Diagnosis not present

## 2013-07-24 DIAGNOSIS — F411 Generalized anxiety disorder: Secondary | ICD-10-CM | POA: Diagnosis not present

## 2013-07-24 DIAGNOSIS — I6990 Unspecified sequelae of unspecified cerebrovascular disease: Secondary | ICD-10-CM | POA: Diagnosis not present

## 2013-07-24 DIAGNOSIS — E119 Type 2 diabetes mellitus without complications: Secondary | ICD-10-CM | POA: Diagnosis not present

## 2013-07-24 DIAGNOSIS — Z23 Encounter for immunization: Secondary | ICD-10-CM | POA: Diagnosis not present

## 2013-11-25 DIAGNOSIS — E119 Type 2 diabetes mellitus without complications: Secondary | ICD-10-CM | POA: Diagnosis not present

## 2013-11-25 DIAGNOSIS — I10 Essential (primary) hypertension: Secondary | ICD-10-CM | POA: Diagnosis not present

## 2013-11-25 DIAGNOSIS — R627 Adult failure to thrive: Secondary | ICD-10-CM | POA: Diagnosis not present

## 2013-11-25 DIAGNOSIS — M810 Age-related osteoporosis without current pathological fracture: Secondary | ICD-10-CM | POA: Diagnosis not present

## 2013-11-25 DIAGNOSIS — E785 Hyperlipidemia, unspecified: Secondary | ICD-10-CM | POA: Diagnosis not present

## 2013-11-25 DIAGNOSIS — I6990 Unspecified sequelae of unspecified cerebrovascular disease: Secondary | ICD-10-CM | POA: Diagnosis not present

## 2013-11-25 DIAGNOSIS — Z6830 Body mass index (BMI) 30.0-30.9, adult: Secondary | ICD-10-CM | POA: Diagnosis not present

## 2013-11-25 DIAGNOSIS — E039 Hypothyroidism, unspecified: Secondary | ICD-10-CM | POA: Diagnosis not present

## 2014-03-30 DIAGNOSIS — D509 Iron deficiency anemia, unspecified: Secondary | ICD-10-CM | POA: Diagnosis not present

## 2014-03-30 DIAGNOSIS — E785 Hyperlipidemia, unspecified: Secondary | ICD-10-CM | POA: Diagnosis not present

## 2014-03-30 DIAGNOSIS — I10 Essential (primary) hypertension: Secondary | ICD-10-CM | POA: Diagnosis not present

## 2014-03-30 DIAGNOSIS — E039 Hypothyroidism, unspecified: Secondary | ICD-10-CM | POA: Diagnosis not present

## 2014-03-30 DIAGNOSIS — Z1331 Encounter for screening for depression: Secondary | ICD-10-CM | POA: Diagnosis not present

## 2014-03-30 DIAGNOSIS — I6990 Unspecified sequelae of unspecified cerebrovascular disease: Secondary | ICD-10-CM | POA: Diagnosis not present

## 2014-03-30 DIAGNOSIS — R627 Adult failure to thrive: Secondary | ICD-10-CM | POA: Diagnosis not present

## 2014-03-30 DIAGNOSIS — Z23 Encounter for immunization: Secondary | ICD-10-CM | POA: Diagnosis not present

## 2014-03-30 DIAGNOSIS — E119 Type 2 diabetes mellitus without complications: Secondary | ICD-10-CM | POA: Diagnosis not present

## 2014-06-16 DIAGNOSIS — Z23 Encounter for immunization: Secondary | ICD-10-CM | POA: Diagnosis not present

## 2014-07-10 DIAGNOSIS — M810 Age-related osteoporosis without current pathological fracture: Secondary | ICD-10-CM | POA: Diagnosis not present

## 2014-07-10 DIAGNOSIS — Z6829 Body mass index (BMI) 29.0-29.9, adult: Secondary | ICD-10-CM | POA: Diagnosis not present

## 2014-07-10 DIAGNOSIS — E559 Vitamin D deficiency, unspecified: Secondary | ICD-10-CM | POA: Diagnosis not present

## 2014-07-10 DIAGNOSIS — E119 Type 2 diabetes mellitus without complications: Secondary | ICD-10-CM | POA: Diagnosis not present

## 2014-07-10 DIAGNOSIS — E785 Hyperlipidemia, unspecified: Secondary | ICD-10-CM | POA: Diagnosis not present

## 2014-07-10 DIAGNOSIS — E039 Hypothyroidism, unspecified: Secondary | ICD-10-CM | POA: Diagnosis not present

## 2014-07-10 DIAGNOSIS — I10 Essential (primary) hypertension: Secondary | ICD-10-CM | POA: Diagnosis not present

## 2014-09-25 DIAGNOSIS — J45909 Unspecified asthma, uncomplicated: Secondary | ICD-10-CM | POA: Diagnosis not present

## 2014-09-25 DIAGNOSIS — I10 Essential (primary) hypertension: Secondary | ICD-10-CM | POA: Diagnosis not present

## 2014-09-25 DIAGNOSIS — E119 Type 2 diabetes mellitus without complications: Secondary | ICD-10-CM | POA: Diagnosis not present

## 2014-09-25 DIAGNOSIS — R05 Cough: Secondary | ICD-10-CM | POA: Diagnosis not present

## 2014-09-25 DIAGNOSIS — Z6830 Body mass index (BMI) 30.0-30.9, adult: Secondary | ICD-10-CM | POA: Diagnosis not present

## 2014-09-25 DIAGNOSIS — R062 Wheezing: Secondary | ICD-10-CM | POA: Diagnosis not present

## 2014-10-20 DIAGNOSIS — Z6830 Body mass index (BMI) 30.0-30.9, adult: Secondary | ICD-10-CM | POA: Diagnosis not present

## 2014-10-20 DIAGNOSIS — I6990 Unspecified sequelae of unspecified cerebrovascular disease: Secondary | ICD-10-CM | POA: Diagnosis not present

## 2014-10-20 DIAGNOSIS — M810 Age-related osteoporosis without current pathological fracture: Secondary | ICD-10-CM | POA: Diagnosis not present

## 2014-10-20 DIAGNOSIS — R05 Cough: Secondary | ICD-10-CM | POA: Diagnosis not present

## 2014-10-20 DIAGNOSIS — E119 Type 2 diabetes mellitus without complications: Secondary | ICD-10-CM | POA: Diagnosis not present

## 2014-10-20 DIAGNOSIS — E559 Vitamin D deficiency, unspecified: Secondary | ICD-10-CM | POA: Diagnosis not present

## 2014-10-20 DIAGNOSIS — F419 Anxiety disorder, unspecified: Secondary | ICD-10-CM | POA: Diagnosis not present

## 2014-10-20 DIAGNOSIS — E039 Hypothyroidism, unspecified: Secondary | ICD-10-CM | POA: Diagnosis not present

## 2014-10-20 DIAGNOSIS — R627 Adult failure to thrive: Secondary | ICD-10-CM | POA: Diagnosis not present

## 2014-10-20 DIAGNOSIS — D509 Iron deficiency anemia, unspecified: Secondary | ICD-10-CM | POA: Diagnosis not present

## 2014-10-20 DIAGNOSIS — I10 Essential (primary) hypertension: Secondary | ICD-10-CM | POA: Diagnosis not present

## 2014-10-20 DIAGNOSIS — E785 Hyperlipidemia, unspecified: Secondary | ICD-10-CM | POA: Diagnosis not present

## 2015-03-01 DIAGNOSIS — D509 Iron deficiency anemia, unspecified: Secondary | ICD-10-CM | POA: Diagnosis not present

## 2015-03-08 ENCOUNTER — Other Ambulatory Visit: Payer: Self-pay

## 2016-12-15 DIAGNOSIS — Z6832 Body mass index (BMI) 32.0-32.9, adult: Secondary | ICD-10-CM | POA: Diagnosis not present

## 2016-12-15 DIAGNOSIS — R21 Rash and other nonspecific skin eruption: Secondary | ICD-10-CM | POA: Diagnosis not present

## 2016-12-15 DIAGNOSIS — L0109 Other impetigo: Secondary | ICD-10-CM | POA: Diagnosis not present

## 2016-12-15 DIAGNOSIS — L940 Localized scleroderma [morphea]: Secondary | ICD-10-CM | POA: Diagnosis not present

## 2017-03-26 DIAGNOSIS — Z6832 Body mass index (BMI) 32.0-32.9, adult: Secondary | ICD-10-CM | POA: Diagnosis not present

## 2017-03-26 DIAGNOSIS — R05 Cough: Secondary | ICD-10-CM | POA: Diagnosis not present

## 2017-03-26 DIAGNOSIS — J189 Pneumonia, unspecified organism: Secondary | ICD-10-CM | POA: Diagnosis not present

## 2017-05-17 DIAGNOSIS — L940 Localized scleroderma [morphea]: Secondary | ICD-10-CM | POA: Diagnosis not present

## 2017-07-09 ENCOUNTER — Inpatient Hospital Stay (HOSPITAL_COMMUNITY)
Admission: EM | Admit: 2017-07-09 | Discharge: 2017-07-17 | DRG: 439 | Disposition: A | Discharge status: Home / Self Care | Payer: Medicare Other | Attending: Family Medicine | Admitting: Family Medicine

## 2017-07-09 ENCOUNTER — Encounter (HOSPITAL_COMMUNITY): Payer: Self-pay

## 2017-07-09 ENCOUNTER — Emergency Department (HOSPITAL_COMMUNITY): Payer: Medicare Other

## 2017-07-09 DIAGNOSIS — E0590 Thyrotoxicosis, unspecified without thyrotoxic crisis or storm: Secondary | ICD-10-CM | POA: Diagnosis not present

## 2017-07-09 DIAGNOSIS — G8929 Other chronic pain: Secondary | ICD-10-CM | POA: Diagnosis present

## 2017-07-09 DIAGNOSIS — F419 Anxiety disorder, unspecified: Secondary | ICD-10-CM

## 2017-07-09 DIAGNOSIS — I700 Atherosclerosis of aorta: Secondary | ICD-10-CM | POA: Diagnosis present

## 2017-07-09 DIAGNOSIS — K859 Acute pancreatitis without necrosis or infection, unspecified: Secondary | ICD-10-CM | POA: Diagnosis not present

## 2017-07-09 DIAGNOSIS — K5900 Constipation, unspecified: Secondary | ICD-10-CM | POA: Diagnosis present

## 2017-07-09 DIAGNOSIS — E785 Hyperlipidemia, unspecified: Secondary | ICD-10-CM | POA: Diagnosis not present

## 2017-07-09 DIAGNOSIS — Z9049 Acquired absence of other specified parts of digestive tract: Secondary | ICD-10-CM

## 2017-07-09 DIAGNOSIS — C9591 Leukemia, unspecified, in remission: Secondary | ICD-10-CM | POA: Diagnosis present

## 2017-07-09 DIAGNOSIS — Z8673 Personal history of transient ischemic attack (TIA), and cerebral infarction without residual deficits: Secondary | ICD-10-CM

## 2017-07-09 DIAGNOSIS — R740 Nonspecific elevation of levels of transaminase and lactic acid dehydrogenase [LDH]: Secondary | ICD-10-CM

## 2017-07-09 DIAGNOSIS — R079 Chest pain, unspecified: Secondary | ICD-10-CM | POA: Diagnosis not present

## 2017-07-09 DIAGNOSIS — R109 Unspecified abdominal pain: Secondary | ICD-10-CM | POA: Diagnosis not present

## 2017-07-09 DIAGNOSIS — Z79890 Hormone replacement therapy (postmenopausal): Secondary | ICD-10-CM

## 2017-07-09 DIAGNOSIS — I10 Essential (primary) hypertension: Secondary | ICD-10-CM | POA: Diagnosis not present

## 2017-07-09 DIAGNOSIS — R0789 Other chest pain: Secondary | ICD-10-CM | POA: Diagnosis present

## 2017-07-09 DIAGNOSIS — Z23 Encounter for immunization: Secondary | ICD-10-CM | POA: Diagnosis not present

## 2017-07-09 DIAGNOSIS — R21 Rash and other nonspecific skin eruption: Secondary | ICD-10-CM | POA: Diagnosis present

## 2017-07-09 DIAGNOSIS — E039 Hypothyroidism, unspecified: Secondary | ICD-10-CM | POA: Diagnosis present

## 2017-07-09 DIAGNOSIS — E119 Type 2 diabetes mellitus without complications: Secondary | ICD-10-CM | POA: Diagnosis present

## 2017-07-09 DIAGNOSIS — Z87891 Personal history of nicotine dependence: Secondary | ICD-10-CM

## 2017-07-09 DIAGNOSIS — Z833 Family history of diabetes mellitus: Secondary | ICD-10-CM

## 2017-07-09 DIAGNOSIS — R7401 Elevation of levels of liver transaminase levels: Secondary | ICD-10-CM

## 2017-07-09 DIAGNOSIS — Z7902 Long term (current) use of antithrombotics/antiplatelets: Secondary | ICD-10-CM

## 2017-07-09 DIAGNOSIS — Z888 Allergy status to other drugs, medicaments and biological substances status: Secondary | ICD-10-CM

## 2017-07-09 DIAGNOSIS — R1013 Epigastric pain: Secondary | ICD-10-CM | POA: Diagnosis present

## 2017-07-09 DIAGNOSIS — D509 Iron deficiency anemia, unspecified: Secondary | ICD-10-CM | POA: Diagnosis present

## 2017-07-09 HISTORY — DX: Hypothyroidism, unspecified: E03.9

## 2017-07-09 LAB — BASIC METABOLIC PANEL
Anion gap: 9 (ref 5–15)
BUN: 14 mg/dL (ref 6–20)
CO2: 23 mmol/L (ref 22–32)
Calcium: 9.5 mg/dL (ref 8.9–10.3)
Chloride: 98 mmol/L — ABNORMAL LOW (ref 101–111)
Creatinine, Ser: 0.82 mg/dL (ref 0.44–1.00)
GFR calc Af Amer: >60 mL/min (ref >60)
GFR calc non Af Amer: >60 mL/min (ref >60)
Glucose, Bld: 144 mg/dL — ABNORMAL HIGH (ref 65–99)
Potassium: 3.8 mmol/L (ref 3.5–5.1)
Sodium: 130 mmol/L — ABNORMAL LOW (ref 135–145)

## 2017-07-09 LAB — CBC
HCT: 34 % — ABNORMAL LOW (ref 36.0–46.0)
Hemoglobin: 11 g/dL — ABNORMAL LOW (ref 12.0–15.0)
MCH: 29.1 pg (ref 26.0–34.0)
MCHC: 32.4 g/dL (ref 30.0–36.0)
MCV: 89.9 fL (ref 78.0–100.0)
Platelets: 282 10*3/uL (ref 150–400)
RBC: 3.78 MIL/uL — ABNORMAL LOW (ref 3.87–5.11)
RDW: 13.6 % (ref 11.5–15.5)
WBC: 7.3 10*3/uL (ref 4.0–10.5)

## 2017-07-09 LAB — I-STAT TROPONIN, ED: Troponin i, poc: 0 ng/mL (ref 0.00–0.08)

## 2017-07-09 NOTE — ED Provider Notes (Signed)
[facility] EMERGENCY DEPARTMENT Provider Note   CSN: [id number] Arrival date & time: 07/09/17  2215     History   Chief Complaint Chief Complaint  Patient presents with  . Chest Pain    HPI [name] is a 83 y.o. female.  HPI  83 y.o. female with a hx of DM2, HTN, HLD, presents to the Emergency Department today via EMS due to centralized CP/Epigastric with radiation into back x 2 hours ago. Pt states this occurred right after dinner. Pain occurred at rest. Noted pressure sensation. Attempted Xanax with minimal relief. EMS arrived and gave 2 SL NTG and reduce pain from 7/10 to 2/10 after several minutes. No N/V. No diaphoresis. Denies chest pain currently. Does endorse non productive cough x 2 days. No fevers. No shortness of breath/abdominal pain. No headaches. No numbness/tingling. No hx ACS. No FH. Pt is on Aggrenox due to stroke x 12 years ago. No other symptoms noted.   Past Medical History:  Diagnosis Date  . Anemia   . Anxiety   . Colon polyps   . DDD (degenerative disc disease), cervical   . Diabetes mellitus   . Diabetes mellitus, type 2 ([facility])   . Hyperlipidemia   . Hypertension   . 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 Mucous retention cyst of maxillary sinus   . Osteoporosis   . Pinched nerve In neck  . Plantar fasciitis   . Stroke ([place])   . Vitamin D deficiency     Patient Active Problem List   Diagnosis Date Noted  . Iron deficiency anemia, unspecified 02/24/2013  . Nonspecific abnormal finding in stool contents 02/24/2013  . Personal history of colonic polyps 02/24/2013  . Anemia     Past Surgical History:  Procedure Laterality Date  . APPENDECTOMY    . CHOLECYSTECTOMY     2004  . COLONOSCOPY N/A 02/24/2013   Procedure: COLONOSCOPY;  Surgeon: Ladene Artist, MD;  Location: [facility];  Service: Endoscopy;  Laterality: N/A;  . COLONOSCOPY W/ POLYPECTOMY    . ESOPHAGOGASTRODUODENOSCOPY    . ESOPHAGOGASTRODUODENOSCOPY N/A  02/24/2013   Procedure: ESOPHAGOGASTRODUODENOSCOPY (EGD);  Surgeon: Ladene Artist, MD;  Location: [facility];  Service: Endoscopy;  Laterality: N/A;  . EYE SURGERY     bilateral catracts    OB History    No data available       Home Medications    Prior to Admission medications   Medication Sig Start Date End Date Taking? Authorizing Provider  ALPRAZolam Duanne Moron) 0.5 MG tablet Take 0.5 mg by mouth 3 (three) times daily as needed. For anxiety    [provider]  amLODipine (NORVASC) 2.5 MG tablet Take 2.5 mg by mouth daily.    [provider]  atorvastatin (LIPITOR) 20 MG tablet Take 20 mg by mouth daily.    [provider]  Calcium Carbonate-Vitamin D (CALCIUM 600 + D PO) Take 1 tablet by mouth daily.    [provider]  cholecalciferol (VITAMIN D) 1000 UNITS tablet Take 1,000 Units by mouth daily.    [provider]  dipyridamole-aspirin (AGGRENOX) 25-200 MG per 12 hr capsule Take 1 capsule by mouth 2 (two) times daily.    [provider]  ferrous sulfate 325 (65 FE) MG EC tablet Take 325 mg by mouth daily with breakfast.    [provider]  levothyroxine (SYNTHROID, LEVOTHROID) 150 MCG tablet Take 150 mcg by mouth daily.    [provider]  olmesartan-hydrochlorothiazide (  BENICAR HCT) 20-12.5 MG per tablet Take 1 tablet by mouth daily.    [provider]  peg 3350 powder (MOVIPREP) 100 G SOLR Take 1 kit (100 g total) by mouth once. 02/07/13   Ladene Artist, MD  potassium chloride SA (K-DUR,KLOR-CON) 20 MEQ tablet Take 20 mEq by mouth daily.    [provider]    Family History Family History  Problem Relation Age of Onset  . Diabetes Unknown     Social History Social History  Substance Use Topics  . Smoking status: Former Research scientist (life sciences)  . Smokeless tobacco: Never Used  . Alcohol use No     Allergies   Prednisone   Review of Systems Review of Systems ROS reviewed and all are  negative for acute change except as noted in the HPI.  Physical Exam Updated Vital Signs BP (!) 124/53   Pulse 82   Temp 98.2 F (36.8 C) (Oral)   Resp 16   Ht 5' (1.524 m)   Wt 72.6 kg (160 lb)   SpO2 96%   BMI 31.25 kg/m   Physical Exam  Constitutional: She is oriented to person, place, and time. She appears well-developed and well-nourished. No distress.  HENT:  Head: Normocephalic and atraumatic.  Right Ear: Tympanic membrane, external ear and ear canal normal.  Left Ear: Tympanic membrane, external ear and ear canal normal.  Nose: Nose normal.  Mouth/Throat: Uvula is midline, oropharynx is clear and moist and mucous membranes are normal. No trismus in the jaw. No oropharyngeal exudate, posterior oropharyngeal erythema or tonsillar abscesses.  Eyes: Pupils are equal, round, and reactive to light. EOM are normal.  Neck: Normal range of motion. Neck supple. No tracheal deviation present.  Cardiovascular: Normal rate, regular rhythm, S1 normal, S2 normal, intact distal pulses and normal pulses.   Murmur heard. Pulmonary/Chest: Effort normal and breath sounds normal. No respiratory distress. She has no decreased breath sounds. She has no wheezes. She has no rhonchi. She has no rales.  Abdominal: Normal appearance and bowel sounds are normal. There is no tenderness.  Musculoskeletal: Normal range of motion.  Neurological: She is alert and oriented to person, place, and time.  Skin: Skin is warm and dry.  Psychiatric: She has a normal mood and affect. Her speech is normal and behavior is normal. Thought content normal.  Nursing note and vitals reviewed.    ED Treatments / Results  Labs (all labs ordered are listed, but only abnormal results are displayed) Labs Reviewed  BASIC METABOLIC PANEL - Abnormal; Notable for the following:       Result Value   Sodium 130 (*)    Chloride 98 (*)    Glucose, Bld 144 (*)    All other components within normal limits  CBC - Abnormal;  Notable for the following:    RBC 3.78 (*)    Hemoglobin 11.0 (*)    HCT 34.0 (*)    All other components within normal limits  I-STAT TROPONIN, ED    EKG  EKG Interpretation  Date/Time:  Monday July 09 2017 22:31:35 EDT Ventricular Rate:  92 PR Interval:    QRS Duration: 73 QT Interval:  345 QTC Calculation: 427 R Axis:   44 Text Interpretation:  Sinus rhythm Confirmed by Ripley Fraise [phone]) on 07/09/2017 11:42:25 PM       Radiology Dg Chest 2 View  Result Date: 07/09/2017 CLINICAL DATA:  Central chest pain EXAM: CHEST  2 VIEW COMPARISON:  09/27/2011 FINDINGS: The heart  size and mediastinal contours are within normal limits. Aortic atherosclerosis at the arch. Emphysematous hyperinflation of the lungs. 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 IMPRESSION: 1. Emphysematous hyperinflation of the lungs. 2. Aortic atherosclerosis without aneurysm. Electronically Signed   By: Ashley Royalty M.D.   On: 07/09/2017 23:43    Procedures Procedures (including critical care time)  Medications Ordered in ED Medications - No data to display   Initial Impression / Assessment and Plan / ED Course  I have reviewed the triage vital signs and the nursing notes.  Pertinent labs & imaging results that were available during my care of the patient were reviewed by me and considered in my medical decision making (see chart for details).  Final Clinical Impressions(s) / ED Diagnoses  {I have reviewed and evaluated the relevant laboratory values. {I have reviewed and evaluated the relevant imaging studies. {I have interpreted the relevant EKG. {I have reviewed the relevant previous healthcare records. {I have reviewed EMS Documentation. {I obtained HPI from historian. {Patient discussed with supervising physician.  ED Course:  Assessment: Pt is a 83 y.o. female with a hx of DM2, HTN, HLD, presents to the Emergency Department today via EMS due to centralized CP/Epigastric  with radiation into back x 2 hours ago. Pt states this occurred right after dinner. Pain occurred at rest. Noted pressure sensation. Attempted Xanax with minimal relief. EMS arrived and gave 2 SL NTG and reduce pain from 7/10 to 2/10 after several minutes. No N/V. No diaphoresis. Denies chest pain currently. Does endorse non productive cough x 2 days. No fevers. No shortness of breath/abdominal pain. No headaches. No numbness/tingling. No hx ACS. No FH. Pt is on Aggrenox due to stroke x 12 years ago. Concern for cardiac etiology of Chest Pain. Medicine has been consulted and will see patient in the ED for likely admit. Pt does not meet criteria for CP protocol and a further evaluation is recommended. Likely gastric etiology, but patient with Heart Score 5 due to comorbidities. Pt has been re-evaluated prior to consult and VSS, NAD, heart RRR, pain 0/10, lungs CTAB. No acute abnormalities found on EKG and first round of cardiac enzymes negative.  Disposition/Plan:  Admit Pt acknowledges and agrees with plan  Supervising Physician Ripley Fraise, MD  Final diagnoses:  Chest pain, unspecified type    New Prescriptions New Prescriptions   No medications on file      Shary Decamp, Hershal Coria 07/10/17 0018    Ripley Fraise, MD 07/10/17 [phone]

## 2017-07-09 NOTE — ED Triage Notes (Signed)
Pt brought in by [facility] from home for centralized chest pain that radiates to the back and epigastric area that began around 2 hours ago. EMS gave 2 of nitro and pt states her pain reduced from a 7 to a 2. Pt denies NVD but has had a nonproductive cough x2 days. Pt is on aggrenox r/t a stroke x12 years ago.

## 2017-07-10 ENCOUNTER — Encounter (HOSPITAL_COMMUNITY): Payer: Self-pay | Admitting: Internal Medicine

## 2017-07-10 ENCOUNTER — Other Ambulatory Visit: Payer: Self-pay | Admitting: Physician Assistant

## 2017-07-10 ENCOUNTER — Observation Stay (HOSPITAL_BASED_OUTPATIENT_CLINIC_OR_DEPARTMENT_OTHER): Payer: Medicare Other

## 2017-07-10 DIAGNOSIS — E785 Hyperlipidemia, unspecified: Secondary | ICD-10-CM | POA: Insufficient documentation

## 2017-07-10 DIAGNOSIS — I340 Nonrheumatic mitral (valve) insufficiency: Secondary | ICD-10-CM | POA: Diagnosis not present

## 2017-07-10 DIAGNOSIS — R079 Chest pain, unspecified: Secondary | ICD-10-CM

## 2017-07-10 DIAGNOSIS — F419 Anxiety disorder, unspecified: Secondary | ICD-10-CM

## 2017-07-10 DIAGNOSIS — I639 Cerebral infarction, unspecified: Secondary | ICD-10-CM | POA: Insufficient documentation

## 2017-07-10 DIAGNOSIS — E039 Hypothyroidism, unspecified: Secondary | ICD-10-CM | POA: Insufficient documentation

## 2017-07-10 LAB — TSH: TSH: 0.927 u[IU]/mL (ref 0.350–4.500)

## 2017-07-10 LAB — ECHOCARDIOGRAM COMPLETE
Height: 60 in
WEIGHTICAEL: 2560 [oz_av]

## 2017-07-10 LAB — GLUCOSE, CAPILLARY
Glucose-Capillary: 114 mg/dL — ABNORMAL HIGH (ref 65–99)
Glucose-Capillary: 120 mg/dL — ABNORMAL HIGH (ref 65–99)
Glucose-Capillary: 138 mg/dL — ABNORMAL HIGH (ref 65–99)

## 2017-07-10 LAB — LIPID PANEL
CHOL/HDL RATIO: 2.9 ratio
Cholesterol: 127 mg/dL (ref 0–200)
HDL: 44 mg/dL (ref >40)
LDL Cholesterol: 59 mg/dL (ref 0–99)
Triglycerides: 119 mg/dL (ref <150)
VLDL: 24 mg/dL (ref 0–40)

## 2017-07-10 LAB — HEMOGLOBIN A1C
Hgb A1c MFr Bld: 5.6 % (ref 4.8–5.6)
MEAN PLASMA GLUCOSE: 114.02 mg/dL

## 2017-07-10 LAB — TROPONIN I
Troponin I: <0.03 ng/mL (ref <0.03)
Troponin I: <0.03 ng/mL (ref <0.03)

## 2017-07-10 LAB — LIPASE, BLOOD: Lipase: 23 U/L (ref 11–51)

## 2017-07-10 MED ORDER — HYDRALAZINE HCL 20 MG/ML IJ SOLN: 5.0000 mg | INTRAMUSCULAR | Status: DC | PRN

## 2017-07-10 MED ORDER — ATORVASTATIN CALCIUM 20 MG PO TABS
20.0000 mg | ORAL_TABLET | Freq: Every day | ORAL | Status: DC
  Administered 2017-07-10 – 2017-07-17 (×7): 20 mg via ORAL
  Filled 2017-07-10 (×8): qty 1

## 2017-07-10 MED ORDER — MORPHINE SULFATE (PF) 4 MG/ML IV SOLN: 2.0000 mg | INTRAVENOUS | Status: DC | PRN

## 2017-07-10 MED ORDER — ACETAMINOPHEN 325 MG PO TABS
650.0000 mg | ORAL_TABLET | ORAL | Status: DC | PRN
  Administered 2017-07-14 (×2): 650 mg via ORAL
  Filled 2017-07-10 (×2): qty 2

## 2017-07-10 MED ORDER — ALBUTEROL SULFATE (2.5 MG/3ML) 0.083% IN NEBU: 2.5000 mg | INHALATION_SOLUTION | RESPIRATORY_TRACT | Status: DC | PRN

## 2017-07-10 MED ORDER — TRIAMCINOLONE ACETONIDE 0.5 % EX CREA
TOPICAL_CREAM | Freq: Two times a day (BID) | CUTANEOUS | Status: DC
  Administered 2017-07-10 – 2017-07-17 (×9): via TOPICAL
  Filled 2017-07-10 (×2): qty 15

## 2017-07-10 MED ORDER — INFLUENZA VAC SPLIT HIGH-DOSE 0.5 ML IM SUSY
0.5000 mL | PREFILLED_SYRINGE | INTRAMUSCULAR | Status: AC
  Administered 2017-07-11: 0.5 mL via INTRAMUSCULAR
  Filled 2017-07-10 (×2): qty 0.5

## 2017-07-10 MED ORDER — ALPRAZOLAM 0.5 MG PO TABS
0.5000 mg | ORAL_TABLET | Freq: Three times a day (TID) | ORAL | Status: DC | PRN
  Administered 2017-07-12 – 2017-07-16 (×6): 0.5 mg via ORAL
  Filled 2017-07-10 (×6): qty 1

## 2017-07-10 MED ORDER — ZOLPIDEM TARTRATE 5 MG PO TABS: 5.0000 mg | ORAL_TABLET | Freq: Every evening | ORAL | Status: DC | PRN

## 2017-07-10 MED ORDER — FERROUS SULFATE 325 (65 FE) MG PO TABS
325.0000 mg | ORAL_TABLET | Freq: Three times a day (TID) | ORAL | Status: DC
  Administered 2017-07-11: 325 mg via ORAL
  Filled 2017-07-10 (×4): qty 1

## 2017-07-10 MED ORDER — OLMESARTAN MEDOXOMIL-HCTZ 20-12.5 MG PO TABS: 2.0000 | ORAL_TABLET | Freq: Every day | ORAL | Status: DC

## 2017-07-10 MED ORDER — ASPIRIN-DIPYRIDAMOLE ER 25-200 MG PO CP12
1.0000 | ORAL_CAPSULE | Freq: Two times a day (BID) | ORAL | Status: DC
  Administered 2017-07-10 (×2): 1 via ORAL
  Filled 2017-07-10 (×4): qty 1

## 2017-07-10 MED ORDER — NITROGLYCERIN 0.4 MG SL SUBL: 0.4000 mg | SUBLINGUAL_TABLET | SUBLINGUAL | Status: DC | PRN

## 2017-07-10 MED ORDER — ALUM & MAG HYDROXIDE-SIMETH 200-200-20 MG/5ML PO SUSP
30.0000 mL | Freq: Four times a day (QID) | ORAL | Status: DC | PRN
  Administered 2017-07-10 – 2017-07-11 (×2): 30 mL via ORAL
  Filled 2017-07-10 (×2): qty 30

## 2017-07-10 MED ORDER — AMLODIPINE BESYLATE 10 MG PO TABS
10.0000 mg | ORAL_TABLET | Freq: Every day | ORAL | Status: DC
  Administered 2017-07-10 – 2017-07-17 (×8): 10 mg via ORAL
  Filled 2017-07-10 (×8): qty 1

## 2017-07-10 MED ORDER — IRBESARTAN 300 MG PO TABS
300.0000 mg | ORAL_TABLET | Freq: Every day | ORAL | Status: DC
  Administered 2017-07-10 – 2017-07-13 (×4): 300 mg via ORAL
  Filled 2017-07-10 (×5): qty 1

## 2017-07-10 MED ORDER — HYDROCHLOROTHIAZIDE 25 MG PO TABS
25.0000 mg | ORAL_TABLET | Freq: Every day | ORAL | Status: DC
  Administered 2017-07-10 – 2017-07-17 (×8): 25 mg via ORAL
  Filled 2017-07-10 (×8): qty 1

## 2017-07-10 MED ORDER — SODIUM CHLORIDE 0.9 % IV SOLN
INTRAVENOUS | Status: DC
  Administered 2017-07-10 – 2017-07-12 (×5): via INTRAVENOUS
  Administered 2017-07-13: 150 mL/h via INTRAVENOUS

## 2017-07-10 MED ORDER — LEVOTHYROXINE SODIUM 75 MCG PO TABS
150.0000 ug | ORAL_TABLET | Freq: Every day | ORAL | Status: DC
  Administered 2017-07-10 – 2017-07-17 (×8): 150 ug via ORAL
  Filled 2017-07-10 (×9): qty 2

## 2017-07-10 MED ORDER — ENOXAPARIN SODIUM 40 MG/0.4ML ~~LOC~~ SOLN
40.0000 mg | Freq: Every day | SUBCUTANEOUS | Status: DC
  Administered 2017-07-11 – 2017-07-17 (×7): 40 mg via SUBCUTANEOUS
  Filled 2017-07-10 (×8): qty 0.4

## 2017-07-10 MED ORDER — DM-GUAIFENESIN ER 30-600 MG PO TB12
1.0000 | ORAL_TABLET | Freq: Two times a day (BID) | ORAL | Status: DC | PRN
  Administered 2017-07-11 – 2017-07-12 (×2): 1 via ORAL
  Filled 2017-07-10 (×2): qty 1

## 2017-07-10 MED ORDER — OXYCODONE-ACETAMINOPHEN 5-325 MG PO TABS
1.0000 | ORAL_TABLET | ORAL | Status: DC | PRN
  Administered 2017-07-10 – 2017-07-15 (×17): 1 via ORAL
  Filled 2017-07-10 (×17): qty 1

## 2017-07-10 MED ORDER — MORPHINE SULFATE (PF) 4 MG/ML IV SOLN
1.0000 mg | INTRAVENOUS | Status: DC | PRN
  Administered 2017-07-10 – 2017-07-13 (×5): 1 mg via INTRAVENOUS
  Filled 2017-07-10 (×6): qty 1

## 2017-07-10 MED ORDER — ONDANSETRON HCL 4 MG/2ML IJ SOLN: 4.0000 mg | Freq: Four times a day (QID) | INTRAMUSCULAR | Status: DC | PRN

## 2017-07-10 MED ORDER — PANTOPRAZOLE SODIUM 40 MG PO TBEC
40.0000 mg | DELAYED_RELEASE_TABLET | Freq: Every day | ORAL | Status: DC
  Administered 2017-07-10 – 2017-07-17 (×9): 40 mg via ORAL
  Filled 2017-07-10 (×8): qty 1

## 2017-07-10 NOTE — Progress Notes (Signed)
Patient was seen and examined at bedside. She was admitted after midnight. Please see H&P for detail. 83 year old female with history of hypertension, hyperlipidemia, diet-controlled diabetes, prior stroke, hypothyroidism, anxiety, leukemia in remission, presented with 1 day of chest pain associated with epigastric pain and back pain. Pain is pressure-like. Troponin negative. -Echocardiogram pending. Patient is currently nothing by mouth. Cardiology was consulted for the evaluation of cardiac stress test.  TSH 0.92, A1c 5.6, LDL 59. Labs in acceptable range. Physical exam unremarkable with clear lungs and normal heart sound. No lower extremity edema. Continue current medical and supportive care. Follow-up cardiology evaluation. Discussed with the patient and her son at bedside. DVT prophylaxis with Lovenox subcutaneous tenderness.

## 2017-07-10 NOTE — ED Notes (Addendum)
2D echo at bedside

## 2017-07-10 NOTE — H&P (Signed)
History and Physical    Jared R Koffman BPZ:[id number] DOB: [birth year] DOA: 07/09/2017  Referring MD/NP/PA:   PCP: Shon Baton, MD   Patient coming from:  The patient is coming from home.  At baseline, pt is independent for most of ADL.  Chief Complaint: chest pain/epigastric pain  HPI: [name] is a 83 y.o. female with medical history significant of hypertension, hyperlipidemia, diet-controlled diabetes, stroke, hypothyroidism, anxiety, leukemia in remission, iron deficiency anemia, who presents with chest pain/epigastric pain.  Pt states that she started having pain in her epigastric area and lower chest after she had dinner. The pain was intermittent, 7 out of 10 in severity, pressure-like, radiating to the lower back. It is associated with mild dry cough and mild SOB. No fever or chills. She does not have nausea, vomiting, diarrhea. Denies symptoms of UTI. Patient states that she has chronic lower back pain, which has worsened today she had chest pain. Patient states that her chest pain and shortness of breath for almost resolved currently. No unilateral weakness.  ED Course: pt was found to have negative troponin, WBC 7.3, electrolytes renal function okay, temperature normal, no tachycardia, no tachypnea, O2 satting 96% on room air. Chest x-ray showed emphysematous change without infiltration. Patient is placed on telemetry bed for observation  Review of Systems:   General: no fevers, chills, no body weight gain, has fatigue HEENT: no blurry vision, hearing changes or sore throat Respiratory: has dyspnea, coughing, no wheezing CV: has chest pain, no palpitations GI: no nausea, vomiting, abdominal pain, diarrhea, constipation GU: no dysuria, burning on urination, increased urinary frequency, hematuria  Ext: no leg edema Neuro: no unilateral weakness, numbness, or tingling, no vision change or hearing loss Skin: no rash, no skin tear. MSK: has back pain Heme: No easy bruising.    Travel history: No recent long distant travel.  Allergy:  Allergies  Allergen Reactions  . Prednisone     Climbs the walls    Past Medical History:  Diagnosis Date  . Anemia   . Anxiety   . Colon polyps   . DDD (degenerative disc disease), cervical   . Diabetes mellitus   . Diabetes mellitus, type 2 ([place])   . Hyperlipidemia   . Hypertension   . Hypothyroidism   . Leukemia ([place]) in remission  . Mucous retention cyst of maxillary sinus   . Osteoporosis   . Pinched nerve In neck  . Plantar fasciitis   . Stroke ([place])   . Vitamin D deficiency     Past Surgical History:  Procedure Laterality Date  . APPENDECTOMY    . CHOLECYSTECTOMY     2004  . COLONOSCOPY N/A 02/24/2013   Procedure: COLONOSCOPY;  Surgeon: Ladene Artist, MD;  Location: [facility];  Service: Endoscopy;  Laterality: N/A;  . COLONOSCOPY W/ POLYPECTOMY    . ESOPHAGOGASTRODUODENOSCOPY    . ESOPHAGOGASTRODUODENOSCOPY N/A 02/24/2013   Procedure: ESOPHAGOGASTRODUODENOSCOPY (EGD);  Surgeon: Ladene Artist, MD;  Location: [facility];  Service: Endoscopy;  Laterality: N/A;  . EYE SURGERY     bilateral catracts    Social History:  reports that she has quit smoking. She has never used smokeless tobacco. She reports that she does not drink alcohol or use drugs.  Family History:  Family History  Problem Relation Age of Onset  . Diabetes Unknown      Prior to Admission medications   Medication Sig Start Date End Date Taking? Authorizing Provider  ALPRAZolam Duanne Moron) 0.5 MG tablet Take  0.5 mg by mouth 3 (three) times daily as needed. For anxiety    [provider]  amLODipine (NORVASC) 2.5 MG tablet Take 2.5 mg by mouth daily.    [provider]  atorvastatin (LIPITOR) 20 MG tablet Take 20 mg by mouth daily.    [provider]  Calcium Carbonate-Vitamin D (CALCIUM 600 + D PO) Take 1 tablet by mouth daily.    [provider]  cholecalciferol (VITAMIN D) 1000 UNITS tablet  Take 1,000 Units by mouth daily.    [provider]  dipyridamole-aspirin (AGGRENOX) 25-200 MG per 12 hr capsule Take 1 capsule by mouth 2 (two) times daily.    [provider]  ferrous sulfate 325 (65 FE) MG EC tablet Take 325 mg by mouth daily with breakfast.    [provider]  levothyroxine (SYNTHROID, LEVOTHROID) 150 MCG tablet Take 150 mcg by mouth daily.    [provider]  olmesartan-hydrochlorothiazide (BENICAR HCT) 20-12.5 MG per tablet Take 1 tablet by mouth daily.    [provider]  peg 3350 powder (MOVIPREP) 100 G SOLR Take 1 kit (100 g total) by mouth once. 02/07/13   Ladene Artist, MD  potassium chloride SA (K-DUR,KLOR-CON) 20 MEQ tablet Take 20 mEq by mouth daily.    [provider]    Physical Exam: Vitals:   07/09/17 2345 07/09/17 2356 07/10/17 0030 07/10/17 0045  BP: (!) 124/53 (!) 124/53 (!) 134/53 (!) 119/56  Pulse: 84 82 76 75  Resp: '17 16 13 13  ' Temp:  98.2 F (36.8 C)    TempSrc:  Oral    SpO2: 94% 96% 94% 95%  Weight:      Height:       General: Not in acute distress HEENT:       Eyes: PERRL, EOMI, no scleral icterus.       ENT: No discharge from the ears and nose, no pharynx injection, no tonsillar enlargement.        Neck: No JVD, no bruit, no mass felt. Heme: No neck lymph node enlargement. Cardiac: H2/D9, RRR, 1/6 systolic murmurs, No gallops or rubs. Respiratory: No rales, wheezing, rhonchi or rubs. GI: Soft, nondistended, nontender, no rebound pain, no organomegaly, BS present. GU: No hematuria Ext: No pitting leg edema bilaterally. 2+DP/PT pulse bilaterally. Musculoskeletal: No joint deformities, No joint redness or warmth, no limitation of ROM in spin. Skin: No rashes.  Neuro: Alert, oriented X3, cranial nerves II-XII grossly intact, moves all extremities normally. Psych: Patient is not psychotic, no suicidal or hemocidal ideation.  Labs on Admission: I have personally reviewed following  labs and imaging studies  CBC:  Recent Labs Lab 07/09/17 2230  WBC 7.3  HGB 11.0*  HCT 34.0*  MCV 89.9  PLT 242   Basic Metabolic Panel:  Recent Labs Lab 07/09/17 2230  NA 130*  K 3.8  CL 98*  CO2 23  GLUCOSE 144*  BUN 14  CREATININE 0.82  CALCIUM 9.5   GFR: Estimated Creatinine Clearance: 43.8 mL/min (by C-G formula based on SCr of 0.82 mg/dL). Liver Function Tests: No results for input(s): AST, ALT, ALKPHOS, BILITOT, PROT, ALBUMIN in the last 168 hours.  Recent Labs Lab 07/10/17 0245  LIPASE 23   No results for input(s): AMMONIA in the last 168 hours. Coagulation Profile: No results for input(s): INR, PROTIME in the last 168 hours. Cardiac Enzymes:  Recent Labs Lab 07/10/17 0245  TROPONINI <0.03   BNP (last 3 results) No results for  input(s): PROBNP in the last 8760 hours. HbA1C: No results for input(s): HGBA1C in the last 72 hours. CBG: No results for input(s): GLUCAP in the last 168 hours. Lipid Profile: No results for input(s): CHOL, HDL, LDLCALC, TRIG, CHOLHDL, LDLDIRECT in the last 72 hours. Thyroid Function Tests: No results for input(s): TSH, T4TOTAL, FREET4, T3FREE, THYROIDAB in the last 72 hours. Anemia Panel: No results for input(s): VITAMINB12, FOLATE, FERRITIN, TIBC, IRON, RETICCTPCT in the last 72 hours. Urine analysis:    Component Value Date/Time   COLORURINE YELLOW 10/05/2008 1806   APPEARANCEUR CLEAR 10/05/2008 1806   LABSPEC 1.008 10/05/2008 1806   PHURINE 6.5 10/05/2008 1806   GLUCOSEU NEGATIVE 10/05/2008 1806   HGBUR NEGATIVE 10/05/2008 1806   BILIRUBINUR NEGATIVE 10/05/2008 1806   KETONESUR NEGATIVE 10/05/2008 1806   PROTEINUR NEGATIVE 10/05/2008 1806   UROBILINOGEN 0.2 10/05/2008 1806   NITRITE NEGATIVE 10/05/2008 1806   LEUKOCYTESUR MODERATE (A) 10/05/2008 1806   Sepsis Labs: '@LABRCNTIP' (procalcitonin:4,lacticidven:4) )No results found for this or any previous visit (from the past 240 hour(s)).   Radiological Exams  on Admission: Dg Chest 2 View  Result Date: 07/09/2017 CLINICAL DATA:  Central chest pain EXAM: CHEST  2 VIEW COMPARISON:  09/27/2011 FINDINGS: The heart size and mediastinal contours are within normal limits. Aortic atherosclerosis at the arch. Emphysematous hyperinflation of the lungs. 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 IMPRESSION: 1. Emphysematous hyperinflation of the lungs. 2. Aortic atherosclerosis without aneurysm. Electronically Signed   By: Ashley Royalty M.D.   On: 07/09/2017 23:43     EKG: Independently reviewed.  Sinus rhythm, QTC 426, anteroseptal infarction pattern.   Assessment/Plan Principal Problem:   Chest pain Active Problems:   Iron deficiency anemia, unspecified   Hyperlipidemia   Anxiety   Stroke [facility])   Hypothyroidism   Chest pain: pt has atypical chest pain. She seems to have pain in both epigastric area and lower chest. Giving her multiple risk factors including hypertension, hyperlipidemia and diabetes mellitus, history of stroke, will do chest pain rule out workup. Other differential diagnoses include acid reflux and esophageal spasm.  - will admit to tele bed as inpt - cycle CE q6 x3 and repeat EKG in the am  - prn Nitroglycerin, Morphine, and lipitor - will not give ASA, since pt is on Aggrenox - Risk factor stratification: will check FLP and A1C  - 2d echo - start Protonix and prn Mylanta empirically - Check lipase  Iron deficiency anemia, unspecified: Hgb stable, 11.0 -continue iron supplement -Follow-up CBC  Hypertension: -Irbesartan and HCTZ -IV hydralazine when necessary  Hyperlipidemia: -Lipitor  Hx of stroke: -Lipitor and Aggrenox  Anxiety: -continue prn Xanax  Hypothyroidism: Last TSH was not on record -Continue home Synthroid -Check TSH   DVT ppx:  SQ Lovenox Code Status: Full code Family Communication: Yes, patient's  son at bed side Disposition Plan:  Anticipate discharge back to previous home  environment Consults called:  none Admission status: Obs / tele     Date of Service 07/10/2017    Ivor Costa [facility] Pager [phone]  If 7PM-7AM, please contact night-coverage [email or web address] Password [email or web address] 07/10/2017, 4:53 AM

## 2017-07-10 NOTE — Progress Notes (Signed)
  Echocardiogram 2D Echocardiogram has been performed.  [name] 07/10/2017, 9:15 AM

## 2017-07-10 NOTE — Consult Note (Signed)
Cardiology Consultation:   Patient ID: [name]; [id number]; 18-May-1929   Admit date: 07/09/2017 Date of Consult: 07/10/2017  Primary Care Provider: Shon Baton, MD Primary Cardiologist: new - Dr. Carron Curie Primary Electrophysiologist:     Patient Profile:   [name] is a 83 y.o. female with a hx of HTN, HLD, DM, anemia, leukemia (in remission), hx of stroke (?12 years ago, on aggrenox), and hypothyroidism who is being seen today for the evaluation of chest pain at the request of Dr. Carolin Sicks.  History of Present Illness:   [name] presented to [facility] after a bout of chest pain and epigastric pain that started after supper last evening while she was washing dishes. She reports some associated shortness of breath, but denies palpitations, diaphoresis, and N/V. She rested, but the pain persisted. She was brought to [facility] and was given SL nitro, which relieved her pain (not noted in med record). She has been chest pain free until several minutes ago when the chest pain and epigastric pain returned. She just received IV morphine which has relieved the chest pain, but not the abdominal pain.   She had a stroke ?12 years ago with residual right sided weakness. She has been maintained on aggrenox since that stroke. Risk factors include HTN,  HLD, and DM.   Past Medical History:  Diagnosis Date  . Anemia   . Anxiety   . Colon polyps   . DDD (degenerative disc disease), cervical   . Diabetes mellitus   . Diabetes mellitus, type 2 ([place])   . Hyperlipidemia   . Hypertension   . Hypothyroidism   . Leukemia ([place]) in remission  . Mucous retention cyst of maxillary sinus   . Osteoporosis   . Pinched nerve In neck  . Plantar fasciitis   . Stroke ([place])   . Vitamin D deficiency     Past Surgical History:  Procedure Laterality Date  . APPENDECTOMY    . CHOLECYSTECTOMY     2004  . COLONOSCOPY N/A 02/24/2013   Procedure: COLONOSCOPY;  Surgeon: Ladene Artist, MD;  Location: [facility]  ENDOSCOPY;  Service: Endoscopy;  Laterality: N/A;  . COLONOSCOPY W/ POLYPECTOMY    . ESOPHAGOGASTRODUODENOSCOPY    . ESOPHAGOGASTRODUODENOSCOPY N/A 02/24/2013   Procedure: ESOPHAGOGASTRODUODENOSCOPY (EGD);  Surgeon: Ladene Artist, MD;  Location: [facility]COPY;  Service: Endoscopy;  Laterality: N/A;  . EYE SURGERY     bilateral catracts     Home Medications:  Prior to Admission medications   Medication Sig Start Date End Date Taking? Authorizing Provider  albuterol (PROVENTIL HFA;VENTOLIN HFA) 108 (90 Base) MCG/ACT inhaler Inhale 1-2 puffs into the lungs every 6 (six) hours as needed for wheezing or shortness of breath.   Yes [provider]  ALPRAZolam Duanne Moron) 0.5 MG tablet Take 0.5 mg by mouth 3 (three) times daily as needed. For anxiety   Yes [provider]  amLODipine (NORVASC) 2.5 MG tablet Take 10 mg by mouth daily.    Yes [provider]  atorvastatin (LIPITOR) 20 MG tablet Take 20 mg by mouth daily.   Yes [provider]  betamethasone dipropionate (DIPROLENE) 0.05 % cream Apply 1 application topically 2 (two) times daily.   Yes [provider]  dipyridamole-aspirin (AGGRENOX) 25-200 MG per 12 hr capsule Take 1 capsule by mouth 2 (two) times daily.   Yes [provider]  ferrous sulfate 325 (65 FE) MG EC tablet Take 325 mg by mouth 3 (three) times daily.  Yes [provider]  levothyroxine (SYNTHROID, LEVOTHROID) 150 MCG tablet Take 150 mcg by mouth daily.   Yes [provider]  olmesartan-hydrochlorothiazide (BENICAR HCT) 20-12.5 MG per tablet Take 2 tablets by mouth daily.    Yes [provider]  potassium chloride SA (K-DUR,KLOR-CON) 20 MEQ tablet Take 20 mEq by mouth daily.   Yes [provider]    Inpatient Medications: Scheduled Meds: . amLODipine  10 mg Oral Daily  . atorvastatin  20 mg Oral Daily  . dipyridamole-aspirin  1 capsule Oral BID  . enoxaparin (LOVENOX) injection  40 mg  Subcutaneous Daily  . ferrous sulfate  325 mg Oral TID WC  . irbesartan  300 mg Oral Daily   And  . hydrochlorothiazide  25 mg Oral Daily  . levothyroxine  150 mcg Oral QAC breakfast  . pantoprazole  40 mg Oral Q1200  . triamcinolone cream   Topical BID   Continuous Infusions: . sodium chloride 75 mL/hr at 07/10/17 0249   PRN Meds: acetaminophen, albuterol, ALPRAZolam, alum & mag hydroxide-simeth, dextromethorphan-guaiFENesin, hydrALAZINE, morphine injection, nitroGLYCERIN, ondansetron (ZOFRAN) IV, oxyCODONE-acetaminophen, zolpidem  Allergies:    Allergies  Allergen Reactions  . Prednisone     Climbs the walls    Social History:   Social History   Social History  . Marital status: Widowed    Spouse name: N/A  . Number of children: N/A  . Years of education: N/A   Occupational History  . retired    Social History Main Topics  . Smoking status: Former Research scientist (life sciences)  . Smokeless tobacco: Never Used  . Alcohol use No  . Drug use: No  . Sexual activity: Not on file   Other Topics Concern  . Not on file   Social History Narrative  . No narrative on file    Family History:    Family History  Problem Relation Age of Onset  . Diabetes Unknown      ROS:  Please see the history of present illness.  ROS  All other ROS reviewed and negative.     Physical Exam/Data:   Vitals:   07/10/17 0745 07/10/17 0756 07/10/17 0929 07/10/17 0943  BP: (!) 105/54   (!) 140/42  Pulse: 66   77  Resp: 14     Temp:    97.9 F (36.6 C)  TempSrc:    Oral  SpO2: 95% 96%  97%  Weight:   161 lb 8 oz (73.3 kg)   Height:   5' (1.524 m)     Intake/Output Summary (Last 24 hours) at 07/10/17 1155 Last data filed at 07/10/17 0835  Gross per 24 hour  Intake                0 ml  Output              300 ml  Net             -300 ml   Filed Weights   07/09/17 2223 07/10/17 0929  Weight: 160 lb (72.6 kg) 161 lb 8 oz (73.3 kg)   Body mass index is 31.54 kg/m.  General:  Well nourished,  well developed, in no acute distress HEENT: normal Neck: no JVD Vascular: No carotid bruits; FA pulses 2+ bilaterally without bruits  Cardiac:  normal S1, S2; RRR; soft systolic murmur Lungs:  clear to auscultation bilaterally, no wheezing, rhonchi or rales  Abd: soft, tender in epigastric region, no hepatomegaly  Ext: no edema Musculoskeletal:  No deformities, BUE and BLE strength normal and equal Skin: warm and dry  Neuro:  CNs 2-12 intact, no focal abnormalities noted Psych:  Normal affect   EKG:  The EKG was personally reviewed and demonstrates:  Sinus rhythm Telemetry:  Telemetry was personally reviewed and demonstrates:  Sinus rhythm  Relevant CV Studies:  Echo: read pending  Laboratory Data:  Chemistry Recent Labs Lab 07/09/17 2230  NA 130*  K 3.8  CL 98*  CO2 23  GLUCOSE 144*  BUN 14  CREATININE 0.82  CALCIUM 9.5  GFRNONAA >60  GFRAA >60  ANIONGAP 9    No results for input(s): PROT, ALBUMIN, AST, ALT, ALKPHOS, BILITOT in the last 168 hours. Hematology Recent Labs Lab 07/09/17 2230  WBC 7.3  RBC 3.78*  HGB 11.0*  HCT 34.0*  MCV 89.9  MCH 29.1  MCHC 32.4  RDW 13.6  PLT 282   Cardiac Enzymes Recent Labs Lab 07/10/17 0245 07/10/17 0758  TROPONINI <0.03 <0.03    Recent Labs Lab 07/09/17 2238  TROPIPOC 0.00    BNPNo results for input(s): BNP, PROBNP in the last 168 hours.  DDimer No results for input(s): DDIMER in the last 168 hours.  Radiology/Studies:  Dg Chest 2 View  Result Date: 07/09/2017 CLINICAL DATA:  Central chest pain EXAM: CHEST  2 VIEW COMPARISON:  09/27/2011 FINDINGS: The heart size and mediastinal contours are within normal limits. Aortic atherosclerosis at the arch. Emphysematous hyperinflation of the lungs. 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 IMPRESSION: 1. Emphysematous hyperinflation of the lungs. 2. Aortic atherosclerosis without aneurysm. Electronically Signed   By: Ashley Royalty M.D.   On: 07/09/2017  23:43    Assessment and Plan:   1. Chest pain - troponin x 3 negative - EKG without signs of ischemia - echo read pending She has risk factors for ACS including HTN, HLD, and DM. Her GRACE score is 134 and her TIMI score is 4, placing her at an intermediate risk. It is unclear if this chest pain is related to a GI/abdominal disturbance given the relationship between the chest pain and abdominal pain and timing of the onset of pain following a large meal. Will discuss with Dr. Burt Knack utility of undergoing stress myoview for ischemic evaluation. Echo results still pending.   2. HTN - home meds: norvasc , benicar  3. HLD - continue lipitor - LDL this admission 59, triglycerides 119  4. DM - per primary team  For questions or updates, please contact [place] Please consult [email or web address] for contact info under Cardiology/STEMI.   Signed, [place], PA  07/10/2017 11:55 AM   Patient seen, examined. Available data reviewed. Agree with findings, assessment, and plan as outlined by Doreene Adas, PA.  The patient is independently interviewed and examined.  She is a pleasant elderly woman in no distress.  Lungs are clear bilaterally.  JVP is normal.  Carotid upstrokes normal without bruits.  Heart is regular rate and rhythm with a 2/6 harsh early peaking systolic murmur at the right upper sternal border.  There is no diastolic murmur.  Abdomen is soft with mild diffuse tenderness, no rebound or guarding, and positive bowel sounds.  Extremities show no edema.  Skin is warm and dry without rash.  Laboratory data is reviewed.  Troponins are negative x3.  Chest x-ray shows aortic atherosclerosis and clear lung fields.  EKG shows normal sinus rhythm and is within normal limits.  Echo shows normal LV systolic function, mild to  moderate mitral regurgitation, and aortic valve sclerosis without significant stenosis.  In summary, the patient has highly atypical pain for a cardiac etiology.   Her symptoms are primarily in the epigastrium with a benign abdominal exam.  A lipase level is normal.  While this patient obviously has risk factors for coronary disease with her advanced age and diabetes, her clinical presentation is not consistent with acute coronary syndrome.  It seems reasonable to consider an outpatient stress test.  The patient is on Aggrenox which will not allow for a pharmacologic nuclear scan because of the dipyridamole component of the drug.  She can be changed to aspirin 81 mg daily and an outpatient nuclear stress test will be arranged after discharge.  I will defer to the primary team whether any further diagnostic imaging evaluation such as CT or ultrasound need to be performed to evaluate the patient's epigastric discomfort.  Sherren Mocha, M.D. 07/10/2017 5:31 PM

## 2017-07-10 NOTE — ED Notes (Signed)
Pt resting in hospital bed with son at bedside. Denies pain. Updated on poc.

## 2017-07-11 ENCOUNTER — Encounter (HOSPITAL_COMMUNITY): Payer: Self-pay | Admitting: *Deleted

## 2017-07-11 DIAGNOSIS — R21 Rash and other nonspecific skin eruption: Secondary | ICD-10-CM | POA: Diagnosis present

## 2017-07-11 DIAGNOSIS — K859 Acute pancreatitis without necrosis or infection, unspecified: Secondary | ICD-10-CM | POA: Diagnosis not present

## 2017-07-11 DIAGNOSIS — E785 Hyperlipidemia, unspecified: Secondary | ICD-10-CM | POA: Diagnosis not present

## 2017-07-11 DIAGNOSIS — G8929 Other chronic pain: Secondary | ICD-10-CM | POA: Diagnosis present

## 2017-07-11 DIAGNOSIS — Z79890 Hormone replacement therapy (postmenopausal): Secondary | ICD-10-CM | POA: Diagnosis not present

## 2017-07-11 DIAGNOSIS — R079 Chest pain, unspecified: Secondary | ICD-10-CM | POA: Diagnosis not present

## 2017-07-11 DIAGNOSIS — R0789 Other chest pain: Secondary | ICD-10-CM | POA: Diagnosis present

## 2017-07-11 DIAGNOSIS — I10 Essential (primary) hypertension: Secondary | ICD-10-CM | POA: Diagnosis present

## 2017-07-11 DIAGNOSIS — E119 Type 2 diabetes mellitus without complications: Secondary | ICD-10-CM

## 2017-07-11 DIAGNOSIS — K5900 Constipation, unspecified: Secondary | ICD-10-CM | POA: Diagnosis present

## 2017-07-11 DIAGNOSIS — I700 Atherosclerosis of aorta: Secondary | ICD-10-CM

## 2017-07-11 DIAGNOSIS — C9591 Leukemia, unspecified, in remission: Secondary | ICD-10-CM | POA: Diagnosis present

## 2017-07-11 DIAGNOSIS — Z833 Family history of diabetes mellitus: Secondary | ICD-10-CM | POA: Diagnosis not present

## 2017-07-11 DIAGNOSIS — R1013 Epigastric pain: Secondary | ICD-10-CM

## 2017-07-11 DIAGNOSIS — Z87891 Personal history of nicotine dependence: Secondary | ICD-10-CM | POA: Diagnosis not present

## 2017-07-11 DIAGNOSIS — R109 Unspecified abdominal pain: Secondary | ICD-10-CM | POA: Diagnosis not present

## 2017-07-11 DIAGNOSIS — Z7902 Long term (current) use of antithrombotics/antiplatelets: Secondary | ICD-10-CM | POA: Diagnosis not present

## 2017-07-11 DIAGNOSIS — Z23 Encounter for immunization: Secondary | ICD-10-CM | POA: Diagnosis not present

## 2017-07-11 DIAGNOSIS — Z888 Allergy status to other drugs, medicaments and biological substances status: Secondary | ICD-10-CM | POA: Diagnosis not present

## 2017-07-11 DIAGNOSIS — D509 Iron deficiency anemia, unspecified: Secondary | ICD-10-CM | POA: Diagnosis present

## 2017-07-11 DIAGNOSIS — R740 Nonspecific elevation of levels of transaminase and lactic acid dehydrogenase [LDH]: Secondary | ICD-10-CM | POA: Diagnosis not present

## 2017-07-11 DIAGNOSIS — Z8673 Personal history of transient ischemic attack (TIA), and cerebral infarction without residual deficits: Secondary | ICD-10-CM | POA: Diagnosis not present

## 2017-07-11 DIAGNOSIS — E039 Hypothyroidism, unspecified: Secondary | ICD-10-CM

## 2017-07-11 DIAGNOSIS — F419 Anxiety disorder, unspecified: Secondary | ICD-10-CM | POA: Diagnosis present

## 2017-07-11 DIAGNOSIS — Z9049 Acquired absence of other specified parts of digestive tract: Secondary | ICD-10-CM | POA: Diagnosis not present

## 2017-07-11 LAB — GLUCOSE, CAPILLARY
GLUCOSE-CAPILLARY: 126 mg/dL — AB (ref 65–99)
GLUCOSE-CAPILLARY: 127 mg/dL — AB (ref 65–99)
GLUCOSE-CAPILLARY: 94 mg/dL (ref 65–99)
Glucose-Capillary: 109 mg/dL — ABNORMAL HIGH (ref 65–99)

## 2017-07-11 LAB — BASIC METABOLIC PANEL
ANION GAP: 8 (ref 5–15)
BUN: 9 mg/dL (ref 6–20)
CHLORIDE: 102 mmol/L (ref 101–111)
CO2: 25 mmol/L (ref 22–32)
Calcium: 8.4 mg/dL — ABNORMAL LOW (ref 8.9–10.3)
Creatinine, Ser: 0.66 mg/dL (ref 0.44–1.00)
GFR calc Af Amer: >60 mL/min (ref >60)
GFR calc non Af Amer: >60 mL/min (ref >60)
Glucose, Bld: 143 mg/dL — ABNORMAL HIGH (ref 65–99)
POTASSIUM: 3.7 mmol/L (ref 3.5–5.1)
Sodium: 135 mmol/L (ref 135–145)

## 2017-07-11 MED ORDER — ASPIRIN 81 MG PO TBEC: 81.0000 mg | DELAYED_RELEASE_TABLET | Freq: Every day | ORAL | Status: DC

## 2017-07-11 MED ORDER — ASPIRIN EC 81 MG PO TBEC
81.0000 mg | DELAYED_RELEASE_TABLET | Freq: Every day | ORAL | Status: DC
  Administered 2017-07-11 – 2017-07-17 (×7): 81 mg via ORAL
  Filled 2017-07-11 (×7): qty 1

## 2017-07-11 NOTE — Progress Notes (Signed)
Patient had order to discharge. Patient wanted to wait for her son to get off from work to pick her up and go over discharge instruction with both of them together. Patient now complains of epigastric pain. Pt and son anxious regarding her being discharged. Pt requesting additional tests and medication. MD made aware. Discharge cancelled and new orders placed.

## 2017-07-11 NOTE — Discharge Summary (Signed)
Physician Discharge Summary  [name] PXT:[id number] DOB: May 21, 1929 DOA: 07/09/2017  PCP: Shon Baton, MD  Admit date: 07/09/2017 Discharge date: 07/11/2017  Recommendations for Outpatient Follow-up:  1. Follow-up atypical chest pain, stress testing planned.  Recommend resuming Aggrenox after stress test. 2. Epigastric abdominal pain if worsens   Follow-up Information    [facility] Follow up on 07/16/2017.   Specialty:  Cardiology Why:  7:45 am for lexiscan [facility] information: [address] [place]       [name], [place] Follow up on 07/19/2017.   Specialty:  Cardiology Why:  11:30 am f/u stress test and hospitalization Contact information: [address] [place] [address] [phone]        Shon Baton, MD. Schedule an appointment as soon as possible for a visit in 1 week(s).   Specialty:  Internal Medicine Contact information: [address] [place] [address] [phone]            Discharge Diagnoses:  1. Atypical chest pain 2. Epigastric abdominal pain 3. Diabetes mellitus type 2, diet controlled 4. Essential hypertension 5. Hyperlipidemia 6. Hypothyroidism 7. Aortic atherosclerosis  Discharge Condition: Improved Disposition: Home  Diet recommendation: Low-fat, diabetic diet, heart healthy  Filed Weights   07/09/17 2223 07/10/17 0929 07/11/17 0514  Weight: 72.6 kg (160 lb) 73.3 kg (161 lb 8 oz) 73.7 kg (162 lb 6.4 oz)    History of present illness:  83 year old woman PMH essential hypertension, hyper lipid, presented with chest pain and epigastric pain dinner with some radiation to the low back.  Symptoms improved with nitroglycerin.  Admitted for further evaluation of chest pain.  Hospital Course:  Patient was observed overnight.  Troponins were negative, EKG nonacute, 2D echocardiogram reassuring.  She was  seen by cardiology with recommendation for changing Aggrenox to aspirin and patient stress test.  No further inpatient evaluation recommended.  Overall the patient feels much better.  She has some occasional epigastric pain, not aggravated by food.  Examination is benign.  She otherwise feels well.  Lipase was unremarkable, no evidence of acute intra-abdominal process. Start low fat diet and follow-up as needed.  Today's assessment: S: Overall feels better.  Occasional mild epigastric pain.  Not associated with food.  No chest pain. O: Vitals: Afebrile, 98.7, 40, 72, 123/43, 91% on RA   Constitutional.  Appears calm, comfortable.  Respiratory.  Clear to auscultation bilaterally.  No wheezes, rales or rhonchi.  Cardiovascular.  2/6 systolic murmur.  No rub or gallop.  No lower extremity edema.  Abdomen.  Soft, nondistended, mild epigastric tenderness with palpation.  No right upper quadrant pain.  Skin.  Upper abdominal rash noted.  She reports this is been followed by her dermatologist and is improving.  Breasts examined with RN Reita Cliche present.  Rash does not extend to the breasts.  Breasts the nipples bilaterally appear unremarkable.  Psychiatric.  Grossly normal mood and affect.  Speech fluent and appropriate.  Discharge Instructions  Discharge Instructions    Diet - low sodium heart healthy    Complete by:  As directed    Diet Carb Modified    Complete by:  As directed    Discharge instructions    Complete by:  As directed    Call your physician or seek immediate medical attention for worsening abdominal pain, vomiting, back pain, chest pain shortness of breath.  Recommend low-fat diet for the next few days  until symptoms completely resolved.   Increase activity slowly    Complete by:  As directed      Allergies as of 07/11/2017      Reactions   Prednisone    Climbs the walls      Medication List    STOP taking these medications   dipyridamole-aspirin 200-25 MG 12hr  capsule Commonly known as:  AGGRENOX     TAKE these medications   albuterol 108 (90 Base) MCG/ACT inhaler Commonly known as:  PROVENTIL HFA;VENTOLIN HFA Inhale 1-2 puffs into the lungs every 6 (six) hours as needed for wheezing or shortness of breath.   ALPRAZolam 0.5 MG tablet Commonly known as:  XANAX Take 0.5 mg by mouth 3 (three) times daily as needed. For anxiety   amLODipine 2.5 MG tablet Commonly known as:  NORVASC Take 10 mg by mouth daily.   aspirin 81 MG EC tablet Take 1 tablet (81 mg total) by mouth daily.   atorvastatin 20 MG tablet Commonly known as:  LIPITOR Take 20 mg by mouth daily.   betamethasone dipropionate 0.05 % cream Commonly known as:  DIPROLENE Apply 1 application topically 2 (two) times daily.   ferrous sulfate 325 (65 FE) MG EC tablet Take 325 mg by mouth 3 (three) times daily.   levothyroxine 150 MCG tablet Commonly known as:  SYNTHROID, LEVOTHROID Take 150 mcg by mouth daily.   olmesartan-hydrochlorothiazide 20-12.5 MG tablet Commonly known as:  BENICAR HCT Take 2 tablets by mouth daily.   potassium chloride SA 20 MEQ tablet Commonly known as:  K-DUR,KLOR-CON Take 20 mEq by mouth daily.      Allergies  Allergen Reactions  . Prednisone     Climbs the walls    The results of significant diagnostics from this hospitalization (including imaging, microbiology, ancillary and laboratory) are listed below for reference.    Significant Diagnostic Studies: Dg Chest 2 View  Result Date: 07/09/2017 CLINICAL DATA:  Central chest pain EXAM: CHEST  2 VIEW COMPARISON:  09/27/2011 FINDINGS: The heart size and mediastinal contours are within normal limits. Aortic atherosclerosis at the arch. Emphysematous hyperinflation of the lungs. 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 IMPRESSION: 1. Emphysematous hyperinflation of the lungs. 2. Aortic atherosclerosis without aneurysm. Electronically Signed   By: Ashley Royalty M.D.   On:  07/09/2017 23:43    Labs: Basic Metabolic Panel:  Recent Labs Lab 07/09/17 2230 07/11/17 0805  NA 130* 135  K 3.8 3.7  CL 98* 102  CO2 23 25  GLUCOSE 144* 143*  BUN 14 9  CREATININE 0.82 0.66  CALCIUM 9.5 8.4*    Recent Labs Lab 07/10/17 0245  LIPASE 23   CBC:  Recent Labs Lab 07/09/17 2230  WBC 7.3  HGB 11.0*  HCT 34.0*  MCV 89.9  PLT 282   Cardiac Enzymes:  Recent Labs Lab 07/10/17 0245 07/10/17 0758 07/10/17 1403  TROPONINI <0.03 <0.03 <0.03    CBG:  Recent Labs Lab 07/10/17 1206 07/10/17 2138 07/11/17 0017 07/11/17 0738 07/11/17 1149  GLUCAP 120* 138* 94 109* 127*    Principal Problem:   Chest pain Active Problems:   Iron deficiency anemia, unspecified   Hyperlipidemia   Anxiety   Stroke [facility])   Hypothyroidism   Time coordinating discharge: 35 minutes  Signed:  Murray Hodgkins, MD [facility] 07/11/2017, 1:31 PM

## 2017-07-12 ENCOUNTER — Telehealth (HOSPITAL_COMMUNITY): Payer: Self-pay | Admitting: *Deleted

## 2017-07-12 ENCOUNTER — Inpatient Hospital Stay (HOSPITAL_COMMUNITY): Payer: Medicare Other

## 2017-07-12 DIAGNOSIS — R740 Nonspecific elevation of levels of transaminase and lactic acid dehydrogenase [LDH]: Secondary | ICD-10-CM

## 2017-07-12 DIAGNOSIS — R7401 Elevation of levels of liver transaminase levels: Secondary | ICD-10-CM

## 2017-07-12 DIAGNOSIS — K859 Acute pancreatitis without necrosis or infection, unspecified: Principal | ICD-10-CM

## 2017-07-12 LAB — GLUCOSE, CAPILLARY
GLUCOSE-CAPILLARY: 117 mg/dL — AB (ref 65–99)
GLUCOSE-CAPILLARY: 97 mg/dL (ref 65–99)
Glucose-Capillary: 120 mg/dL — ABNORMAL HIGH (ref 65–99)
Glucose-Capillary: 94 mg/dL (ref 65–99)

## 2017-07-12 LAB — COMPREHENSIVE METABOLIC PANEL
ALBUMIN: 3.3 g/dL — AB (ref 3.5–5.0)
ALK PHOS: 79 U/L (ref 38–126)
ALT: 203 U/L — ABNORMAL HIGH (ref 14–54)
ANION GAP: 7 (ref 5–15)
AST: 191 U/L — ABNORMAL HIGH (ref 15–41)
BUN: 10 mg/dL (ref 6–20)
CALCIUM: 8.7 mg/dL — AB (ref 8.9–10.3)
CHLORIDE: 101 mmol/L (ref 101–111)
CO2: 26 mmol/L (ref 22–32)
Creatinine, Ser: 0.77 mg/dL (ref 0.44–1.00)
GFR calc Af Amer: >60 mL/min (ref >60)
GFR calc non Af Amer: >60 mL/min (ref >60)
GLUCOSE: 132 mg/dL — AB (ref 65–99)
Potassium: 3.7 mmol/L (ref 3.5–5.1)
SODIUM: 134 mmol/L — AB (ref 135–145)
Total Bilirubin: 0.9 mg/dL (ref 0.3–1.2)
Total Protein: 5.7 g/dL — ABNORMAL LOW (ref 6.5–8.1)

## 2017-07-12 LAB — LIPASE, BLOOD: Lipase: 96 U/L — ABNORMAL HIGH (ref 11–51)

## 2017-07-12 MED ORDER — SENNA 8.6 MG PO TABS
1.0000 | ORAL_TABLET | Freq: Every day | ORAL | Status: DC
  Administered 2017-07-12: 8.6 mg via ORAL
  Filled 2017-07-12 (×2): qty 1

## 2017-07-12 MED ORDER — POLYETHYLENE GLYCOL 3350 17 G PO PACK
17.0000 g | PACK | Freq: Two times a day (BID) | ORAL | Status: DC
Start: 2017-07-12 — End: 2017-07-14
  Administered 2017-07-12 – 2017-07-13 (×3): 17 g via ORAL
  Filled 2017-07-12 (×5): qty 1

## 2017-07-12 NOTE — Progress Notes (Addendum)
Patient slept mostly during the night. Vital signs stable. Patient continues to complain of epigastric pain. Pain medication given and effective.  Will contine to monitor.  Phylliss Strege, RN

## 2017-07-12 NOTE — Progress Notes (Signed)
  PROGRESS NOTE  [name] [id number] DOB: March 18, 1929 DOA: 07/09/2017 PCP: Shon Baton, MD  Brief Narrative: 83 year old woman PMH essential hypertension, hyper lipid, presented with chest pain and epigastric pain dinner with some radiation to the low back. Symptoms improved with nitroglycerin. Admitted for further evaluation of chest pain. ACS rulled out but epigastric pain continued. Follow-up labs reviewed elevated lipase and elevated transaminases.  Assessment/Plan Acute pancreatitis Abd u/s unremarkable except mild intrahepatic biliary duct dilatation as well as upper limit normal CBD presumed sequelae of cholecystectomy. TG WNL -NPO, antiemetics, IVF -check lipase and CMP in AM  Elevated transaminases -etiology unclear. Check hepatitis panel  Constipation -bowel regimen  DM type 2, diet-controlled -CBG stable  Atypical chest pain -ACS ruled out. Troponins were negative, EKG nonacute, 2D echocardiogram reassuring.  She was seen by cardiology with recommendation for changing Aggrenox to aspirin and patient stress test  Not ready for d/c.  DVT prophylaxis: enoxaparin Code Status: full Family Communication:  Disposition Plan: home    Murray Hodgkins, MD  [facility] Direct contact: [phone] --[place]ia [place]  --[email or web address]; password [email or web address]  7PM-7AM contact night coverage as above 07/12/2017, 4:00 PM  LOS: 1 day   Consultants:    Procedures:    Antimicrobials:    Interval history/Subjective: Continues to have epigastric pain, nausea, no vomiting.   Objective: Vitals: afebrile, 98.1, 84, 149/55, 92% on RA  Exam:    Constitutional:  . Appears calm, mildly comfortable Eyes:  . pupils and irises appear normal . Normal lids   ENMT:  . grossly normal hearing  . Lips appear normal Respiratory:  . CTA bilaterally, no w/r/r.  . Respiratory effort normal. Cardiovascular:  . RRR, no r/g; 2/6 systolic murmur . No LE extremity edema     Abdomen:  . Epigastric tenderness mild, mild RUQ pain . No masses noted Musculoskeletal:  . Digits/nails BUE: no clubbing, cyanosis, petechiae, infection . RUE, LUE, RLE, LLE   o strength and tone normal, no atrophy, no abnormal movements Skin:  . No change in abdominal rash Psychiatric:  . judgement and insight appear normal . Mental status o Mood, affect appropriate  I have personally reviewed the following:   Labs:  CBG stable  Lipase 96  AST and ALT 191/203  Normal T bili and AP  TG 119  Imaging studies:  Abd u/s unremarkable except mild intrahepatic biliary duct dilatation as well as upper limit normal CBD presumed sequelae of cholecystectomy  Scheduled Meds: . amLODipine  10 mg Oral Daily  . aspirin EC  81 mg Oral Daily  . atorvastatin  20 mg Oral Daily  . enoxaparin (LOVENOX) injection  40 mg Subcutaneous Daily  . irbesartan  300 mg Oral Daily   And  . hydrochlorothiazide  25 mg Oral Daily  . levothyroxine  150 mcg Oral QAC breakfast  . pantoprazole  40 mg Oral Q1200  . polyethylene glycol  17 g Oral BID  . senna  1 tablet Oral QHS  . triamcinolone cream   Topical BID   Continuous Infusions: . sodium chloride 75 mL/hr at 07/12/17 1457    Principal Problem:   Acute pancreatitis Active Problems:   Chest pain   Elevated transaminase level   LOS: 1 day

## 2017-07-12 NOTE — Telephone Encounter (Signed)
Patient in hospital.Patient's nurse(Heather) given detailed instructions per Myocardial Perfusion Study Information Sheet for the test on 07/16/17 at 0745. Patient notified to arrive 15 minutes early and that it is imperative to arrive on time for appointment to keep from having the test rescheduled.  If you need to cancel or reschedule your appointment, please call the office within 24 hours of your appointment. . Patient verbalized understanding.[name], [name]

## 2017-07-12 NOTE — Plan of Care (Signed)
Problem: Education: Goal: Knowledge of Pancreatitis treatment and prevention will improve Outcome: Progressing Patient and son educated on pancreatitis, patient has had this in the past and is somewhat knowledgeable regarding its treatment; educational handouts provided as well

## 2017-07-12 NOTE — Progress Notes (Signed)
Patient stable during 7 a to 7 p shift, continues to complain of abdominal pain that is controlled with IV Morphine and Percocet.  Educated patient and her son regarding pancreatitis.  Son at bedside part of shift.

## 2017-07-12 NOTE — Plan of Care (Signed)
Problem: Pain Managment: Goal: General experience of comfort will improve Outcome: Progressing Pain controlled with IV MS and po pain meds  Problem: Nutritional: Goal: Ability to achieve adequate nutritional intake will improve Outcome: Not Progressing Patient NPO for belly rest d/t pancreatitis

## 2017-07-13 LAB — CBC
HCT: 32.5 % — ABNORMAL LOW (ref 36.0–46.0)
HEMOGLOBIN: 10.2 g/dL — AB (ref 12.0–15.0)
MCH: 29 pg (ref 26.0–34.0)
MCHC: 31.4 g/dL (ref 30.0–36.0)
MCV: 92.3 fL (ref 78.0–100.0)
Platelets: 199 10*3/uL (ref 150–400)
RBC: 3.52 MIL/uL — ABNORMAL LOW (ref 3.87–5.11)
RDW: 13.6 % (ref 11.5–15.5)
WBC: 4.2 10*3/uL (ref 4.0–10.5)

## 2017-07-13 LAB — COMPREHENSIVE METABOLIC PANEL
ALBUMIN: 3.3 g/dL — AB (ref 3.5–5.0)
ALK PHOS: 73 U/L (ref 38–126)
ALT: 145 U/L — ABNORMAL HIGH (ref 14–54)
ANION GAP: 8 (ref 5–15)
AST: 84 U/L — AB (ref 15–41)
BILIRUBIN TOTAL: 0.8 mg/dL (ref 0.3–1.2)
BUN: 6 mg/dL (ref 6–20)
CALCIUM: 8.5 mg/dL — AB (ref 8.9–10.3)
CO2: 26 mmol/L (ref 22–32)
Chloride: 102 mmol/L (ref 101–111)
Creatinine, Ser: 0.61 mg/dL (ref 0.44–1.00)
GFR calc Af Amer: >60 mL/min (ref >60)
GLUCOSE: 89 mg/dL (ref 65–99)
Potassium: 3.4 mmol/L — ABNORMAL LOW (ref 3.5–5.1)
SODIUM: 136 mmol/L (ref 135–145)
TOTAL PROTEIN: 5.5 g/dL — AB (ref 6.5–8.1)

## 2017-07-13 LAB — GLUCOSE, CAPILLARY
GLUCOSE-CAPILLARY: 82 mg/dL (ref 65–99)
Glucose-Capillary: 83 mg/dL (ref 65–99)
Glucose-Capillary: 71 mg/dL (ref 65–99)
Glucose-Capillary: 93 mg/dL (ref 65–99)

## 2017-07-13 LAB — LIPASE, BLOOD: LIPASE: 23 U/L (ref 11–51)

## 2017-07-13 MED ORDER — KCL IN DEXTROSE-NACL 20-5-0.45 MEQ/L-%-% IV SOLN
INTRAVENOUS | Status: DC
  Administered 2017-07-13 – 2017-07-16 (×6): via INTRAVENOUS
  Filled 2017-07-13 (×11): qty 1000

## 2017-07-13 NOTE — Progress Notes (Signed)
  PROGRESS NOTE  [name] VZC:[id number] DOB: [birth year] DOA: 07/09/2017 PCP: Shon Baton, MD  Brief Narrative: 83 year old woman PMH essential hypertension, hyper lipid, presented with chest pain and epigastric pain dinner with some radiation to the low back. Symptoms improved with nitroglycerin. Admitted for further evaluation of chest pain. ACS rulled out but epigastric pain continued. Follow-up labs reviewed elevated lipase and elevated transaminases.  Assessment/Plan Acute pancreatitis Abd u/s unremarkable except mild intrahepatic biliary duct dilatation as well as upper limit normal CBD presumed sequelae of cholecystectomy. TG WNL -lipase has normalized but still has pain.  -keep NPO, continue IVF, antiemetics and analgesics  Elevated transaminases -trending down, etiology unclear -check hepatitis panel  Constipation -bowels moved. Continue bowel regimen.  DM type 2, diet-controlled -remains stable.  Atypical chest pain -ACS ruled out. Troponins were negative, EKG nonacute, 2D echocardiogram reassuring.  She was seen by cardiology with recommendation for changing Aggrenox to aspirin and patient stress test  DVT prophylaxis: enoxaparin Code Status: full Family Communication: discussed with son by telephone yesterday evening Disposition Plan: home    Murray Hodgkins, MD  [facility] Direct contact: [phone] --[place]ia [place]  --[email or web address]; password [email or web address]  7PM-7AM contact night coverage as above 07/13/2017, 1:50 PM  LOS: 2 days   Consultants:    Procedures:    Antimicrobials:    Interval history/Subjective: Feels ok but still has significant epigastric pain.  Objective: Vitals:  Vitals:   07/13/17 0349 07/13/17 1202  BP: (!) 143/55 (!) 136/49  Pulse: 86 77  Resp: 18 16  Temp: 97.9 F (36.6 C) 98 F (36.7 C)  SpO2: 94% 93%     Exam: Constitutional:  . Appears calm, mildly uncomfortable Respiratory:  . CTA bilaterally, no  w/r/r.  . Respiratory effort normal.  Cardiovascular:  . RRR, no m/r/g . No LE extremity edema   Abdomen:  Marland Kitchen Mild epigastric pain with palpation  Psychiatric:  . judgement and insight appear normal . Mental status o Mood, affect appropriate  I have personally reviewed the following:   Labs:  CBG stable  Lipase has normalized  AST and ALT trending down  Hgb stable  Imaging studies:    Scheduled Meds: . amLODipine  10 mg Oral Daily  . aspirin EC  81 mg Oral Daily  . atorvastatin  20 mg Oral Daily  . enoxaparin (LOVENOX) injection  40 mg Subcutaneous Daily  . irbesartan  300 mg Oral Daily   And  . hydrochlorothiazide  25 mg Oral Daily  . levothyroxine  150 mcg Oral QAC breakfast  . pantoprazole  40 mg Oral Q1200  . polyethylene glycol  17 g Oral BID  . senna  1 tablet Oral QHS  . triamcinolone cream   Topical BID   Continuous Infusions: . sodium chloride 150 mL/hr (07/13/17 0515)    Principal Problem:   Acute pancreatitis Active Problems:   Chest pain   Elevated transaminase level   LOS: 2 days

## 2017-07-13 NOTE — Progress Notes (Signed)
Patient slept  during the night. Continues to complain of abdominal pain, PRN medications given and effective. Vital signs stable. Will continue to monitor.  Griff Badley, RN

## 2017-07-14 DIAGNOSIS — E119 Type 2 diabetes mellitus without complications: Secondary | ICD-10-CM

## 2017-07-14 LAB — COMPREHENSIVE METABOLIC PANEL
ALBUMIN: 3.2 g/dL — AB (ref 3.5–5.0)
ALK PHOS: 69 U/L (ref 38–126)
ALT: 101 U/L — ABNORMAL HIGH (ref 14–54)
ANION GAP: 10 (ref 5–15)
AST: 44 U/L — ABNORMAL HIGH (ref 15–41)
BILIRUBIN TOTAL: 0.8 mg/dL (ref 0.3–1.2)
BUN: 5 mg/dL — ABNORMAL LOW (ref 6–20)
CALCIUM: 8.4 mg/dL — AB (ref 8.9–10.3)
CO2: 25 mmol/L (ref 22–32)
Chloride: 100 mmol/L — ABNORMAL LOW (ref 101–111)
Creatinine, Ser: 0.6 mg/dL (ref 0.44–1.00)
GLUCOSE: 137 mg/dL — AB (ref 65–99)
POTASSIUM: 3.4 mmol/L — AB (ref 3.5–5.1)
Sodium: 135 mmol/L (ref 135–145)
TOTAL PROTEIN: 5.7 g/dL — AB (ref 6.5–8.1)

## 2017-07-14 LAB — GLUCOSE, CAPILLARY
GLUCOSE-CAPILLARY: 109 mg/dL — AB (ref 65–99)
GLUCOSE-CAPILLARY: 132 mg/dL — AB (ref 65–99)
GLUCOSE-CAPILLARY: 134 mg/dL — AB (ref 65–99)
GLUCOSE-CAPILLARY: 140 mg/dL — AB (ref 65–99)
Glucose-Capillary: 137 mg/dL — ABNORMAL HIGH (ref 65–99)

## 2017-07-14 MED ORDER — POLYETHYLENE GLYCOL 3350 17 G PO PACK
17.0000 g | PACK | Freq: Every day | ORAL | Status: DC
  Filled 2017-07-14: qty 1

## 2017-07-14 NOTE — Progress Notes (Signed)
  PROGRESS NOTE  [name] HCW:[id number] DOB: Sep 19, 1928 DOA: 07/09/2017 PCP: Shon Baton, MD  Brief Narrative: 83 year old woman PMH essential hypertension, hyper lipid, presented with chest pain and epigastric pain dinner with some radiation to the low back. Symptoms improved with nitroglycerin. Admitted for further evaluation of chest pain. ACS rulled out but epigastric pain continued. Follow-up labs reviewed elevated lipase and elevated transaminases.  Assessment/Plan Acute pancreatitis. Abd u/s unremarkable except mild intrahepatic biliary duct dilatation as well as upper limit normal CBD presumed sequelae of cholecystectomy. TG WNL -slowly improving but still poor appetite. Keep NPO, continue IVF, supportive care.  -likely advance diet in AM  Elevated transaminases -continue to trend down. Hepatitis panel pending.  Constipation -bowels moving. Some diarrhea. Back off on bowel regimen.  DM type 2, diet-controlled -remains stable. Continue SSI  Atypical chest pain -ACS ruled out. Troponins were negative, EKG nonacute, 2D echocardiogram reassuring.  She was seen by cardiology with recommendation for changing Aggrenox to aspirin and patient stress test  DVT prophylaxis: enoxaparin Code Status: full Family Communication: discussed with son by telephone yesterday evening Disposition Plan: home    Murray Hodgkins, MD  [facility] Direct contact: [phone] --[address]ia [place]  --[email or web address]; password [email or web address]  7PM-7AM contact night coverage as above 07/14/2017, 12:59 PM  LOS: 3 days   Consultants:    Procedures:    Antimicrobials:    Interval history/Subjective: A little better today, less abdominal pain. Not hungry.  Objective: Vitals:  Vitals:   07/14/17 0409 07/14/17 1200  BP: (!) 151/47 (!) 126/47  Pulse: 76 72  Resp: 18 18  Temp: 98.6 F (37 C) 98.3 F (36.8 C)  SpO2: 96% 93%     Exam:  Constitutional:  . Appears calm and  comfortable Respiratory:  . CTA bilaterally, no w/r/r.  . Respiratory effort normal.  Cardiovascular:  . RRR, no m/r/g . No LE extremity edema   Abdomen:  . Less pain with palpation of epigastrum, now mild Psychiatric:  . Mental status o Mood, affect appropriate  I have personally reviewed the following:   Labs:  CBG remains stable  AST and ALT trending down. AP and total bilirubin WNL  Imaging studies:    Scheduled Meds: . amLODipine  10 mg Oral Daily  . aspirin EC  81 mg Oral Daily  . atorvastatin  20 mg Oral Daily  . enoxaparin (LOVENOX) injection  40 mg Subcutaneous Daily  . hydrochlorothiazide  25 mg Oral Daily  . levothyroxine  150 mcg Oral QAC breakfast  . pantoprazole  40 mg Oral Q1200  . polyethylene glycol  17 g Oral BID  . senna  1 tablet Oral QHS  . triamcinolone cream   Topical BID   Continuous Infusions: . dextrose 5 % and 0.45 % NaCl with KCl 20 mEq/L 125 mL/hr at 07/14/17 0430    Principal Problem:   Acute pancreatitis Active Problems:   Chest pain   Elevated transaminase level   LOS: 3 days

## 2017-07-14 NOTE — Progress Notes (Signed)
Pt slept most of the night. Complained of abdominal pain was given Oxycodone and Tylenol. Pt had no other complainants and vitals are stable.  Drue Flirt, RN

## 2017-07-15 ENCOUNTER — Other Ambulatory Visit: Payer: Self-pay

## 2017-07-15 LAB — BASIC METABOLIC PANEL
ANION GAP: 8 (ref 5–15)
CHLORIDE: 102 mmol/L (ref 101–111)
CO2: 25 mmol/L (ref 22–32)
Calcium: 8.7 mg/dL — ABNORMAL LOW (ref 8.9–10.3)
Creatinine, Ser: 0.58 mg/dL (ref 0.44–1.00)
GFR calc Af Amer: >60 mL/min (ref >60)
GFR calc non Af Amer: >60 mL/min (ref >60)
GLUCOSE: 99 mg/dL (ref 65–99)
POTASSIUM: 3.5 mmol/L (ref 3.5–5.1)
SODIUM: 135 mmol/L (ref 135–145)

## 2017-07-15 LAB — GLUCOSE, CAPILLARY
GLUCOSE-CAPILLARY: 133 mg/dL — AB (ref 65–99)
GLUCOSE-CAPILLARY: 143 mg/dL — AB (ref 65–99)
Glucose-Capillary: 100 mg/dL — ABNORMAL HIGH (ref 65–99)
Glucose-Capillary: 139 mg/dL — ABNORMAL HIGH (ref 65–99)
Glucose-Capillary: 158 mg/dL — ABNORMAL HIGH (ref 65–99)
Glucose-Capillary: 127 mg/dL — ABNORMAL HIGH (ref 65–99)

## 2017-07-15 LAB — HEPATITIS PANEL, ACUTE
HEP B C IGM: NEGATIVE
HEP B S AG: NEGATIVE
Hep A IgM: NEGATIVE

## 2017-07-15 MED ORDER — LOPERAMIDE HCL 2 MG PO CAPS
4.0000 mg | ORAL_CAPSULE | ORAL | Status: DC | PRN
  Administered 2017-07-15 – 2017-07-16 (×2): 4 mg via ORAL
  Filled 2017-07-15 (×2): qty 2

## 2017-07-15 NOTE — Progress Notes (Signed)
  PROGRESS NOTE  [name] [id number] DOB: 08/11/1929 DOA: 07/09/2017 PCP: Shon Baton, MD  Brief Narrative: 83 year old woman PMH essential hypertension, hyper lipid, presented with chest pain and epigastric pain dinner with some radiation to the low back. Symptoms improved with nitroglycerin. Admitted for further evaluation of chest pain. ACS rulled out but epigastric pain continued. Follow-up labs reviewed elevated lipase and elevated transaminases.  Assessment/Plan Acute pancreatitis. Abd u/s unremarkable except mild intrahepatic biliary duct dilatation as well as upper limit normal CBD presumed sequelae of cholecystectomy. TG WNL - labs improved/normalized but no improvement in pain.  - nontoxic. Repeat lipase, CMP and CBC in AM - will consult GI in AM in no improved  Elevated transaminases - repeat CMP in AM  Constipation - bowels moving, some diarrhea. D/c bowel regimen.  DM type 2, diet-controlled - stable. Continue SSI  Atypical chest pain -ACS ruled out. Troponins were negative, EKG nonacute, 2D echocardiogram reassuring.  She was seen by cardiology with recommendation for changing Aggrenox to aspirin and patient stress test  DVT prophylaxis: enoxaparin Code Status: full Family Communication: discussed with grandson at bedside Disposition Plan: home    Murray Hodgkins, MD  [facility] Direct contact: [phone] --[address]ia [place]  --[email or web address]; password [email or web address]  7PM-7AM contact night coverage as above 07/15/2017, 1:32 PM  LOS: 4 days   Consultants:    Procedures:    Antimicrobials:    Interval history/Subjective: Felt better last night. Tried some ginger ale this AM and had immediately abd pain and diarrhea. C/o HA. Hungry.  Objective: Vitals:  Vitals:   07/15/17 0427 07/15/17 1200  BP: (!) 148/49 (!) 151/52  Pulse: 64 86  Resp: 18 20  Temp: 98 F (36.7 C) 98 F (36.7 C)  SpO2: 94% 95%      Exam: Constitutional: . Appears uncomfortable but not toxic. Calm. Respiratory:  . CTA bilaterally, no w/r/r.  . Respiratory effort normal.  Cardiovascular:  . RRR, no m/r/g . No LE extremity edema   Abdomen:  . +soft, mild epigastric pain with palpitation.  Psychiatric:  . judgement and insight appear normal . Mental status o Mood, affect appropriate  I have personally reviewed the following:   Labs:  CBG stable  BMP unremarkable  Imaging studies:    Scheduled Meds: . amLODipine  10 mg Oral Daily  . aspirin EC  81 mg Oral Daily  . atorvastatin  20 mg Oral Daily  . enoxaparin (LOVENOX) injection  40 mg Subcutaneous Daily  . hydrochlorothiazide  25 mg Oral Daily  . levothyroxine  150 mcg Oral QAC breakfast  . pantoprazole  40 mg Oral Q1200  . polyethylene glycol  17 g Oral Daily  . triamcinolone cream   Topical BID   Continuous Infusions: . dextrose 5 % and 0.45 % NaCl with KCl 20 mEq/L 125 mL/hr at 07/15/17 1116    Principal Problem:   Acute pancreatitis Active Problems:   Chest pain   Elevated transaminase level   DM type 2 (diabetes mellitus, type 2) ([place])   LOS: 4 days

## 2017-07-16 ENCOUNTER — Encounter (HOSPITAL_COMMUNITY): Payer: Medicare Other

## 2017-07-16 LAB — COMPREHENSIVE METABOLIC PANEL
ALBUMIN: 3.1 g/dL — AB (ref 3.5–5.0)
ALK PHOS: 67 U/L (ref 38–126)
ALT: 63 U/L — AB (ref 14–54)
AST: 31 U/L (ref 15–41)
Anion gap: 7 (ref 5–15)
CALCIUM: 8.7 mg/dL — AB (ref 8.9–10.3)
CO2: 25 mmol/L (ref 22–32)
CREATININE: 0.61 mg/dL (ref 0.44–1.00)
Chloride: 102 mmol/L (ref 101–111)
GFR calc non Af Amer: >60 mL/min (ref >60)
GLUCOSE: 140 mg/dL — AB (ref 65–99)
Potassium: 3.8 mmol/L (ref 3.5–5.1)
SODIUM: 134 mmol/L — AB (ref 135–145)
Total Bilirubin: 0.6 mg/dL (ref 0.3–1.2)
Total Protein: 5.4 g/dL — ABNORMAL LOW (ref 6.5–8.1)

## 2017-07-16 LAB — CBC
HCT: 31.3 % — ABNORMAL LOW (ref 36.0–46.0)
Hemoglobin: 9.9 g/dL — ABNORMAL LOW (ref 12.0–15.0)
MCH: 28.9 pg (ref 26.0–34.0)
MCHC: 31.6 g/dL (ref 30.0–36.0)
MCV: 91.5 fL (ref 78.0–100.0)
PLATELETS: 133 10*3/uL — AB (ref 150–400)
RBC: 3.42 MIL/uL — ABNORMAL LOW (ref 3.87–5.11)
RDW: 13.6 % (ref 11.5–15.5)
WBC: 4.5 10*3/uL (ref 4.0–10.5)

## 2017-07-16 LAB — GLUCOSE, CAPILLARY
GLUCOSE-CAPILLARY: 79 mg/dL (ref 65–99)
GLUCOSE-CAPILLARY: 98 mg/dL (ref 65–99)
Glucose-Capillary: 126 mg/dL — ABNORMAL HIGH (ref 65–99)
Glucose-Capillary: 127 mg/dL — ABNORMAL HIGH (ref 65–99)
Glucose-Capillary: 129 mg/dL — ABNORMAL HIGH (ref 65–99)
Glucose-Capillary: 156 mg/dL — ABNORMAL HIGH (ref 65–99)

## 2017-07-16 LAB — LIPASE, BLOOD: Lipase: 33 U/L (ref 11–51)

## 2017-07-16 NOTE — Progress Notes (Signed)
  PROGRESS NOTE  [name] [id number]:[id number] DOB: 12/08/1928 DOA: 07/09/2017 PCP: Shon Baton, MD  Brief Narrative: 83 year old woman PMH essential hypertension, hyper lipid, presented with chest pain and epigastric pain dinner with some radiation to the low back. Symptoms improved with nitroglycerin. Admitted for further evaluation of chest pain. ACS rulled out but epigastric pain continued. Follow-up labs reviewed elevated lipase and elevated transaminases.  Assessment/Plan Acute pancreatitis. Abd u/s unremarkable except mild intrahepatic biliary duct dilatation as well as upper limit normal CBD presumed sequelae of cholecystectomy. TG WNL - much improved today, pain resolved, tolerating liquids - advance to full liquids tonight and solids in AM if tolerates and then likely home 11/6  Elevated transaminases - nearly resolved  Constipation - resolved  DM type 2, diet-controlled - remains stable, continue SSI  Atypical chest pain -ACS ruled out. Troponins were negative, EKG nonacute, 2D echocardiogram reassuring.  She was seen by cardiology with recommendation for changing Aggrenox to aspirin and patient stress test  DVT prophylaxis: enoxaparin Code Status: full Family Communication:   Disposition Plan: home    Murray Hodgkins, MD  [facility] Direct contact: [phone] --Via [place]  --[email or web address]; password [email or web address]  7PM-7AM contact night coverage as above 07/16/2017, 12:52 PM  LOS: 5 days   Consultants:    Procedures:    Antimicrobials:    Interval history/Subjective: Feels much better, no pain, tolerating liquids.  Objective: Vitals:  Vitals:   07/15/17 1930 07/16/17 0439  BP: (!) 154/56 (!) 148/52  Pulse: 82 80  Resp: 18 18  Temp: 98.2 F (36.8 C) 98 F (36.7 C)  SpO2: 94% 94%     Exam:  Constitutional:   . Appears calm and comfortable Respiratory:  . CTA bilaterally, no w/r/r.  . Respiratory effort normal.   Cardiovascular:    . RRR, no m/r/g . No LE extremity edema   Abdomen:  . Soft, ntnd Psychiatric:  . Mental status o Mood, affect appropriate  I have personally reviewed the following:   UOP: 1200 I/O since admission:  Last BM charted: 11/5 Foley: no Telemetry: no Status: med  Labs:  CBG stable  BMP unremarkable  AST norma, ALT trending down. Lipase WNL  Hgb stable 9.9  Hepatitis panel negative  Imaging studies:    Scheduled Meds: . amLODipine  10 mg Oral Daily  . aspirin EC  81 mg Oral Daily  . atorvastatin  20 mg Oral Daily  . enoxaparin (LOVENOX) injection  40 mg Subcutaneous Daily  . hydrochlorothiazide  25 mg Oral Daily  . levothyroxine  150 mcg Oral QAC breakfast  . pantoprazole  40 mg Oral Q1200  . triamcinolone cream   Topical BID   Continuous Infusions: . dextrose 5 % and 0.45 % NaCl with KCl 20 mEq/L 125 mL/hr at 07/16/17 0603    Principal Problem:   Acute pancreatitis Active Problems:   Chest pain   Elevated transaminase level   DM type 2 (diabetes mellitus, type 2) ([place])   LOS: 5 days

## 2017-07-16 NOTE — Progress Notes (Signed)
Full liquid dinner tolerated well by patient.

## 2017-07-16 NOTE — Care Management Important Message (Signed)
Important Message  Patient Details  Name: [name] MRN: [id number] Date of Birth: 07-27-1929   Medicare Important Message Given:  Yes    Enaya Howze [address] 07/16/2017, 1:37 PM

## 2017-07-17 LAB — GLUCOSE, CAPILLARY
GLUCOSE-CAPILLARY: 100 mg/dL — AB (ref 65–99)
GLUCOSE-CAPILLARY: 92 mg/dL (ref 65–99)
GLUCOSE-CAPILLARY: 98 mg/dL (ref 65–99)
Glucose-Capillary: 87 mg/dL (ref 65–99)
Glucose-Capillary: 149 mg/dL — ABNORMAL HIGH (ref 65–99)

## 2017-07-17 LAB — CBC
HEMATOCRIT: 33.6 % — AB (ref 36.0–46.0)
HEMOGLOBIN: 10.9 g/dL — AB (ref 12.0–15.0)
MCH: 29.1 pg (ref 26.0–34.0)
MCHC: 32.4 g/dL (ref 30.0–36.0)
MCV: 89.8 fL (ref 78.0–100.0)
Platelets: 230 10*3/uL (ref 150–400)
RBC: 3.74 MIL/uL — ABNORMAL LOW (ref 3.87–5.11)
RDW: 13.5 % (ref 11.5–15.5)
WBC: 5.1 10*3/uL (ref 4.0–10.5)

## 2017-07-17 LAB — CREATININE, SERUM
Creatinine, Ser: 0.66 mg/dL (ref 0.44–1.00)
GFR calc Af Amer: >60 mL/min (ref >60)
GFR calc non Af Amer: >60 mL/min (ref >60)

## 2017-07-17 NOTE — Progress Notes (Signed)
Pt tolerated heart healthy breakfast well.

## 2017-07-17 NOTE — Progress Notes (Signed)
Pt has orders to be discharged. Discharge instructions given and pt has no additional questions at this time. Medication regimen reviewed and pt educated. Pt verbalized understanding and has no additional questions. Telemetry box removed. IV removed and site in good condition. Pt stable and waiting for transportation.  Khamiya Varin RN 

## 2017-07-17 NOTE — Discharge Summary (Signed)
Physician Discharge Summary  [name] RAQ:[id number] DOB: 01-13-1929 DOA: 07/09/2017  PCP: Shon Baton, MD  Admit date: 07/09/2017 Discharge date: 07/17/2017  Recommendations for Outpatient Follow-up:  1. F/u resolution of pancreatitis.  Follow-up Information    [facility] Follow up on 07/16/2017.   Specialty:  Cardiology Why:  7:45 am for lexiscan [facility] information: [address]       [name], [place] Follow up on 07/19/2017.   Specialty:  Cardiology Why:  11:30 am f/u stress test and hospitalization Contact information: [address] [place] [address] [phone]        Shon Baton, MD. Schedule an appointment as soon as possible for a visit in 1 week(s).   Specialty:  Internal Medicine Why:  Left message office wll call Contact information: [address] [place] [address] [phone]            Discharge Diagnoses:  1. Acute pancreatitis 2. Elevated transaminases 3. DM type 2, diet-controlled 4. Atypical chest pain  Discharge Condition: improved Disposition: home  Diet recommendation: diabetic diet  Filed Weights   07/15/17 0427 07/16/17 0439 07/17/17 0417  Weight: 72.7 kg (160 lb 3.2 oz) 72.3 kg (159 lb 6.4 oz) 71.6 kg (157 lb 12.8 oz)    History of present illness:  83 year old woman PMH essential hypertension, hyper lipid, presented with chest pain and epigastric pain dinner with some radiation to the low back. Symptoms improved with nitroglycerin. Admitted for further evaluation of chest pain.   Hospital Course:  ACS rulled out but epigastric pain continued. Follow-up labs reviewed elevated lipase and elevated transaminases. Patient was treated with supportive care of acute pancreatitis. Hospitalization as prolonged by slow progress but now tolerating diet.   Acute pancreatitis. Abd u/s unremarkable  except mild intrahepatic biliary duct dilatation as well as upper limit normal CBD presumed sequelae of cholecystectomy. TG WNL - much improved today, pain resolved   Elevated transaminases - trending down, acute hepatitis panel negative, abd u/s unremarkable  Constipation - resolved  DM type 2, diet-controlled - stable  Atypical chest pain -ACS ruled out. Troponins were negative, EKG nonacute, 2D echocardiogram reassuring. She was seen by cardiology with recommendation for changing Aggrenox to aspirin and patient stress test -patient however does not want to pursue test and requests cancellation and will restart Aggrenox.  Today's assessment: S: feels much better. No pain. Had eggs and bacon for breakfast. O: Vitals:  Vitals:   07/17/17 0417 07/17/17 1215  BP: (!) 132/46 (!) 144/47  Pulse: 67 73  Resp: 16 18  Temp: 98.5 F (36.9 C) (!) 97.5 F (36.4 C)  SpO2: 93% 96%    Constitutional:  . Appears calm and comfortable Respiratory:  . CTA bilaterally, no w/r/r.  . Respiratory effort normal.  Cardiovascular:  . RRR, no m/r/g Abdomen:  . Soft, ntnd Psychiatric:  . Mental status o Mood, affect appropriate   Discharge Instructions  Discharge Instructions    Diet - low sodium heart healthy   Complete by:  As directed    Diet Carb Modified   Complete by:  As directed    Discharge instructions   Complete by:  As directed    Call your physician or seek immediate medical attention for worsening abdominal pain, vomiting, back pain, chest pain shortness of breath.  Recommend low-fat diet for the next few days until symptoms completely resolved.   Increase activity slowly  Complete by:  As directed      Allergies as of 07/17/2017      Reactions   Prednisone    Climbs the walls      Medication List    TAKE these medications   albuterol 108 (90 Base) MCG/ACT inhaler Commonly known as:  PROVENTIL HFA;VENTOLIN HFA Inhale 1-2 puffs into the lungs every 6 (six)  hours as needed for wheezing or shortness of breath.   ALPRAZolam 0.5 MG tablet Commonly known as:  XANAX Take 0.5 mg by mouth 3 (three) times daily as needed. For anxiety   amLODipine 2.5 MG tablet Commonly known as:  NORVASC Take 10 mg by mouth daily.   atorvastatin 20 MG tablet Commonly known as:  LIPITOR Take 20 mg by mouth daily.   betamethasone dipropionate 0.05 % cream Commonly known as:  DIPROLENE Apply 1 application topically 2 (two) times daily.   dipyridamole-aspirin 200-25 MG 12hr capsule Commonly known as:  AGGRENOX Take 1 capsule by mouth 2 (two) times daily.   ferrous sulfate 325 (65 FE) MG EC tablet Take 325 mg by mouth 3 (three) times daily.   levothyroxine 150 MCG tablet Commonly known as:  SYNTHROID, LEVOTHROID Take 150 mcg by mouth daily.   olmesartan-hydrochlorothiazide 20-12.5 MG tablet Commonly known as:  BENICAR HCT Take 2 tablets by mouth daily.   potassium chloride SA 20 MEQ tablet Commonly known as:  K-DUR,KLOR-CON Take 20 mEq by mouth daily.      Allergies  Allergen Reactions  . Prednisone     Climbs the walls    The results of significant diagnostics from this hospitalization (including imaging, microbiology, ancillary and laboratory) are listed below for reference.    Significant Diagnostic Studies: Dg Chest 2 View  Result Date: 07/09/2017 CLINICAL DATA:  Central chest pain EXAM: CHEST  2 VIEW COMPARISON:  09/27/2011 FINDINGS: The heart size and mediastinal contours are within normal limits. Aortic atherosclerosis at the arch. Emphysematous hyperinflation of the lungs. 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 IMPRESSION: 1. Emphysematous hyperinflation of the lungs. 2. Aortic atherosclerosis without aneurysm. Electronically Signed   By: Ashley Royalty M.D.   On: 07/09/2017 23:43   US Abdomen Complete  Result Date: 07/12/2017 CLINICAL DATA:  Epigastric pain EXAM: ABDOMEN ULTRASOUND COMPLETE COMPARISON:  Jan 19, 2004  FINDINGS: Gallbladder: Surgically absent. Common bile duct: Diameter: 10 mm, upper limits of normal for post cholecystectomy state. No biliary duct mass or calculus appreciable. Liver: No focal lesion identified. There is mild intrahepatic biliary duct dilatation. Within normal limits in parenchymal echogenicity. Portal vein is patent on color Doppler imaging with normal direction of blood flow towards the liver. IVC: No abnormality visualized. Pancreas: No pancreatic mass or inflammatory focus. Spleen: Size and appearance within normal limits. Right Kidney: Length: 9.6 cm. Echogenicity within normal limits. No mass or hydronephrosis visualized. Left Kidney: Length: 10.0 cm. Echogenicity within normal limits. No mass or hydronephrosis visualized. Abdominal aorta: No aneurysm visualized. Other findings: No demonstrable ascites. IMPRESSION: Gallbladder absent. Mild intrahepatic biliary duct dilatation as well as upper limit normal common bile duct, presumed sequelae of prior cholecystectomy. Study otherwise unremarkable. Electronically Signed   By: Lowella Grip III M.D.   On: 07/12/2017 10:08   Labs: Basic Metabolic Panel: Recent Labs  Lab 07/12/17 0530 07/13/17 0332 07/14/17 0515 07/15/17 0341 07/16/17 0322 07/17/17 0523  NA 134* 136 135 135 134*  --   K 3.7 3.4* 3.4* 3.5 3.8  --   CL 101 102  100* 102 102  --   CO2 26 26 25 25 25   --   GLUCOSE 132* 89 137* 99 140*  --   BUN 10 6 5* <5* <5*  --   CREATININE 0.77 0.61 0.60 0.58 0.61 0.66  CALCIUM 8.7* 8.5* 8.4* 8.7* 8.7*  --    Liver Function Tests: Recent Labs  Lab 07/12/17 0530 07/13/17 0332 07/14/17 0515 07/16/17 0322  AST 191* 84* 44* 31  ALT 203* 145* 101* 63*  ALKPHOS 79 73 69 67  BILITOT 0.9 0.8 0.8 0.6  PROT 5.7* 5.5* 5.7* 5.4*  ALBUMIN 3.3* 3.3* 3.2* 3.1*   Recent Labs  Lab 07/12/17 0530 07/13/17 0332 07/16/17 0322  LIPASE 96* 23 33   CBC: Recent Labs  Lab 07/13/17 0332 07/16/17 0322  WBC 4.2 4.5  HGB 10.2*  9.9*  HCT 32.5* 31.3*  MCV 92.3 91.5  PLT 199 133*   Cardiac Enzymes: Recent Labs  Lab 07/10/17 1403  TROPONINI <0.03    CBG: Recent Labs  Lab 07/16/17 2021 07/17/17 0003 07/17/17 0411 07/17/17 0740 07/17/17 1214  GLUCAP 98 100* 98 87 92    Principal Problem:   Acute pancreatitis Active Problems:   Chest pain   Elevated transaminase level   DM type 2 (diabetes mellitus, type 2) ([place])   Time coordinating discharge: 25 minutes  Signed:  Murray Hodgkins, MD [facility] 07/17/2017, 12:48 PM

## 2017-07-19 ENCOUNTER — Ambulatory Visit: Payer: Medicare Other | Admitting: Physician Assistant

## 2017-08-15 DIAGNOSIS — K859 Acute pancreatitis without necrosis or infection, unspecified: Secondary | ICD-10-CM | POA: Diagnosis not present

## 2017-08-15 DIAGNOSIS — K5909 Other constipation: Secondary | ICD-10-CM | POA: Diagnosis not present

## 2017-08-15 DIAGNOSIS — Z6831 Body mass index (BMI) 31.0-31.9, adult: Secondary | ICD-10-CM | POA: Diagnosis not present

## 2017-08-15 DIAGNOSIS — R0789 Other chest pain: Secondary | ICD-10-CM | POA: Diagnosis not present

## 2017-08-15 DIAGNOSIS — E119 Type 2 diabetes mellitus without complications: Secondary | ICD-10-CM | POA: Diagnosis not present

## 2017-08-15 DIAGNOSIS — R740 Nonspecific elevation of levels of transaminase and lactic acid dehydrogenase [LDH]: Secondary | ICD-10-CM | POA: Diagnosis not present

## 2017-08-15 DIAGNOSIS — M545 Low back pain: Secondary | ICD-10-CM | POA: Diagnosis not present

## 2017-11-20 DIAGNOSIS — K5909 Other constipation: Secondary | ICD-10-CM | POA: Diagnosis not present

## 2017-11-20 DIAGNOSIS — E038 Other specified hypothyroidism: Secondary | ICD-10-CM | POA: Diagnosis not present

## 2017-11-20 DIAGNOSIS — R740 Nonspecific elevation of levels of transaminase and lactic acid dehydrogenase [LDH]: Secondary | ICD-10-CM | POA: Diagnosis not present

## 2017-11-20 DIAGNOSIS — Z6831 Body mass index (BMI) 31.0-31.9, adult: Secondary | ICD-10-CM | POA: Diagnosis not present

## 2017-11-20 DIAGNOSIS — M79673 Pain in unspecified foot: Secondary | ICD-10-CM | POA: Diagnosis not present

## 2017-11-20 DIAGNOSIS — E119 Type 2 diabetes mellitus without complications: Secondary | ICD-10-CM | POA: Diagnosis not present

## 2017-11-20 DIAGNOSIS — Z8719 Personal history of other diseases of the digestive system: Secondary | ICD-10-CM | POA: Diagnosis not present

## 2017-11-20 DIAGNOSIS — R82998 Other abnormal findings in urine: Secondary | ICD-10-CM | POA: Diagnosis not present

## 2017-11-20 DIAGNOSIS — I10 Essential (primary) hypertension: Secondary | ICD-10-CM | POA: Diagnosis not present

## 2017-11-20 DIAGNOSIS — R0789 Other chest pain: Secondary | ICD-10-CM | POA: Diagnosis not present

## 2017-11-20 DIAGNOSIS — R0609 Other forms of dyspnea: Secondary | ICD-10-CM | POA: Diagnosis not present

## 2017-11-20 DIAGNOSIS — E559 Vitamin D deficiency, unspecified: Secondary | ICD-10-CM | POA: Diagnosis not present

## 2017-11-20 DIAGNOSIS — D509 Iron deficiency anemia, unspecified: Secondary | ICD-10-CM | POA: Diagnosis not present

## 2017-11-21 DIAGNOSIS — D509 Iron deficiency anemia, unspecified: Secondary | ICD-10-CM | POA: Diagnosis not present

## 2017-11-27 DIAGNOSIS — Z6831 Body mass index (BMI) 31.0-31.9, adult: Secondary | ICD-10-CM | POA: Diagnosis not present

## 2017-11-27 DIAGNOSIS — E7849 Other hyperlipidemia: Secondary | ICD-10-CM | POA: Diagnosis not present

## 2017-11-27 DIAGNOSIS — I10 Essential (primary) hypertension: Secondary | ICD-10-CM | POA: Diagnosis not present

## 2017-11-27 DIAGNOSIS — M810 Age-related osteoporosis without current pathological fracture: Secondary | ICD-10-CM | POA: Diagnosis not present

## 2017-11-27 DIAGNOSIS — Z1389 Encounter for screening for other disorder: Secondary | ICD-10-CM | POA: Diagnosis not present

## 2017-11-27 DIAGNOSIS — D509 Iron deficiency anemia, unspecified: Secondary | ICD-10-CM | POA: Diagnosis not present

## 2017-11-27 DIAGNOSIS — R0609 Other forms of dyspnea: Secondary | ICD-10-CM | POA: Diagnosis not present

## 2017-11-27 DIAGNOSIS — Z8719 Personal history of other diseases of the digestive system: Secondary | ICD-10-CM | POA: Diagnosis not present

## 2017-11-27 DIAGNOSIS — Z0000 Encounter for general adult medical examination without abnormal findings: Secondary | ICD-10-CM | POA: Diagnosis not present

## 2017-11-27 DIAGNOSIS — M79673 Pain in unspecified foot: Secondary | ICD-10-CM | POA: Diagnosis not present

## 2017-11-27 DIAGNOSIS — E119 Type 2 diabetes mellitus without complications: Secondary | ICD-10-CM | POA: Diagnosis not present

## 2017-11-27 DIAGNOSIS — I6990 Unspecified sequelae of unspecified cerebrovascular disease: Secondary | ICD-10-CM | POA: Diagnosis not present

## 2017-11-30 ENCOUNTER — Other Ambulatory Visit: Payer: Self-pay | Admitting: Internal Medicine

## 2017-11-30 ENCOUNTER — Encounter (HOSPITAL_COMMUNITY): Payer: Self-pay | Admitting: Emergency Medicine

## 2017-11-30 ENCOUNTER — Other Ambulatory Visit: Payer: Self-pay

## 2017-11-30 DIAGNOSIS — E1141 Type 2 diabetes mellitus with diabetic mononeuropathy: Secondary | ICD-10-CM | POA: Diagnosis present

## 2017-11-30 DIAGNOSIS — K219 Gastro-esophageal reflux disease without esophagitis: Secondary | ICD-10-CM | POA: Diagnosis present

## 2017-11-30 DIAGNOSIS — Z888 Allergy status to other drugs, medicaments and biological substances status: Secondary | ICD-10-CM

## 2017-11-30 DIAGNOSIS — I10 Essential (primary) hypertension: Secondary | ICD-10-CM | POA: Diagnosis present

## 2017-11-30 DIAGNOSIS — Z1212 Encounter for screening for malignant neoplasm of rectum: Secondary | ICD-10-CM | POA: Diagnosis not present

## 2017-11-30 DIAGNOSIS — K449 Diaphragmatic hernia without obstruction or gangrene: Secondary | ICD-10-CM | POA: Diagnosis present

## 2017-11-30 DIAGNOSIS — I69351 Hemiplegia and hemiparesis following cerebral infarction affecting right dominant side: Secondary | ICD-10-CM | POA: Diagnosis not present

## 2017-11-30 DIAGNOSIS — E876 Hypokalemia: Secondary | ICD-10-CM | POA: Diagnosis present

## 2017-11-30 DIAGNOSIS — K922 Gastrointestinal hemorrhage, unspecified: Secondary | ICD-10-CM | POA: Diagnosis not present

## 2017-11-30 DIAGNOSIS — M1990 Unspecified osteoarthritis, unspecified site: Secondary | ICD-10-CM | POA: Diagnosis present

## 2017-11-30 DIAGNOSIS — K31819 Angiodysplasia of stomach and duodenum without bleeding: Secondary | ICD-10-CM | POA: Diagnosis not present

## 2017-11-30 DIAGNOSIS — Z87891 Personal history of nicotine dependence: Secondary | ICD-10-CM

## 2017-11-30 DIAGNOSIS — E785 Hyperlipidemia, unspecified: Secondary | ICD-10-CM | POA: Diagnosis present

## 2017-11-30 DIAGNOSIS — D509 Iron deficiency anemia, unspecified: Secondary | ICD-10-CM | POA: Diagnosis present

## 2017-11-30 DIAGNOSIS — K5520 Angiodysplasia of colon without hemorrhage: Secondary | ICD-10-CM | POA: Diagnosis present

## 2017-11-30 DIAGNOSIS — Z9841 Cataract extraction status, right eye: Secondary | ICD-10-CM

## 2017-11-30 DIAGNOSIS — C9591 Leukemia, unspecified, in remission: Secondary | ICD-10-CM | POA: Diagnosis not present

## 2017-11-30 DIAGNOSIS — M810 Age-related osteoporosis without current pathological fracture: Secondary | ICD-10-CM | POA: Diagnosis present

## 2017-11-30 DIAGNOSIS — M5030 Other cervical disc degeneration, unspecified cervical region: Secondary | ICD-10-CM | POA: Diagnosis present

## 2017-11-30 DIAGNOSIS — Z9049 Acquired absence of other specified parts of digestive tract: Secondary | ICD-10-CM

## 2017-11-30 DIAGNOSIS — D123 Benign neoplasm of transverse colon: Secondary | ICD-10-CM | POA: Diagnosis present

## 2017-11-30 DIAGNOSIS — D62 Acute posthemorrhagic anemia: Secondary | ICD-10-CM | POA: Diagnosis present

## 2017-11-30 DIAGNOSIS — Z79890 Hormone replacement therapy (postmenopausal): Secondary | ICD-10-CM

## 2017-11-30 DIAGNOSIS — Z9842 Cataract extraction status, left eye: Secondary | ICD-10-CM

## 2017-11-30 DIAGNOSIS — Z86010 Personal history of colonic polyps: Secondary | ICD-10-CM

## 2017-11-30 DIAGNOSIS — E559 Vitamin D deficiency, unspecified: Secondary | ICD-10-CM | POA: Diagnosis present

## 2017-11-30 DIAGNOSIS — E871 Hypo-osmolality and hyponatremia: Secondary | ICD-10-CM | POA: Diagnosis not present

## 2017-11-30 DIAGNOSIS — Z79899 Other long term (current) drug therapy: Secondary | ICD-10-CM

## 2017-11-30 DIAGNOSIS — R42 Dizziness and giddiness: Secondary | ICD-10-CM | POA: Diagnosis not present

## 2017-11-30 DIAGNOSIS — K621 Rectal polyp: Secondary | ICD-10-CM | POA: Diagnosis present

## 2017-11-30 DIAGNOSIS — R531 Weakness: Secondary | ICD-10-CM | POA: Diagnosis not present

## 2017-11-30 DIAGNOSIS — F419 Anxiety disorder, unspecified: Secondary | ICD-10-CM | POA: Diagnosis present

## 2017-11-30 DIAGNOSIS — E039 Hypothyroidism, unspecified: Secondary | ICD-10-CM | POA: Diagnosis present

## 2017-11-30 LAB — CBC
HEMATOCRIT: 27.1 % — AB (ref 36.0–46.0)
Hemoglobin: 8.3 g/dL — ABNORMAL LOW (ref 12.0–15.0)
MCH: 28.4 pg (ref 26.0–34.0)
MCHC: 30.6 g/dL (ref 30.0–36.0)
MCV: 92.8 fL (ref 78.0–100.0)
PLATELETS: 320 10*3/uL (ref 150–400)
RBC: 2.92 MIL/uL — ABNORMAL LOW (ref 3.87–5.11)
RDW: 16.6 % — AB (ref 11.5–15.5)
WBC: 7 10*3/uL (ref 4.0–10.5)

## 2017-11-30 LAB — COMPREHENSIVE METABOLIC PANEL
ALBUMIN: 3.7 g/dL (ref 3.5–5.0)
ALK PHOS: 81 U/L (ref 38–126)
ALT: 16 U/L (ref 14–54)
AST: 22 U/L (ref 15–41)
Anion gap: 11 (ref 5–15)
BILIRUBIN TOTAL: 0.3 mg/dL (ref 0.3–1.2)
BUN: 12 mg/dL (ref 6–20)
CALCIUM: 8.6 mg/dL — AB (ref 8.9–10.3)
CO2: 22 mmol/L (ref 22–32)
Chloride: 94 mmol/L — ABNORMAL LOW (ref 101–111)
Creatinine, Ser: 0.8 mg/dL (ref 0.44–1.00)
GFR calc Af Amer: >60 mL/min (ref >60)
GFR calc non Af Amer: >60 mL/min (ref >60)
GLUCOSE: 218 mg/dL — AB (ref 65–99)
POTASSIUM: 3.4 mmol/L — AB (ref 3.5–5.1)
Sodium: 127 mmol/L — ABNORMAL LOW (ref 135–145)
TOTAL PROTEIN: 6 g/dL — AB (ref 6.5–8.1)

## 2017-11-30 NOTE — ED Triage Notes (Signed)
Pt sent by doctor for eval of possible GI bleed. Pt has had low hgb with unknown cause. Pt had a BM tonight with bright red blood and he suggested to come to ED for eval. Denies abd pain, denies N/V.

## 2017-11-30 NOTE — Progress Notes (Signed)
Patient ID: [name], female   DOB: May 10, 1929, 84 y.o.   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 W/up was benign but she was more SOB than expected - CXR was (-).  2D echo 07/10/17 - - Left ventricle: The cavity size was normal. Systolic function was normal. The EF was in the range of 60% to 65%. Wall motion was normal; there were no regional wall   motion abnormalities. (grade 2 diastolic dysfunction). - Mitral valve: Calcified annulus. There was mild to moderate regurgitation directed centrally.   CBC showed Hbg 9.4 and noted Iron Deficiency.   She was treated for URI and seen back in 1 week.  The URI Sxs were much better and foot where she dropped a laptop computer the week before was also much better.  Repeat CBC on 3/19 showed Hbg 8.1.  My plan was stop the Aggrenox.  We ran a Negative Hemosure.  No ASA products and no NSAIDs/Ibu either. I scheduled IV Feraheme 2 doses at the day hospital for this upcoming week.   Continue Oral Iron.  Follow CBC/Iron/TIBC 1 week afterward and not wait a full month as we planned per yesterdays office visit and prior to me knowing the Hbg was worse.  I planned to send her back to her GI Dr Fuller Plan. 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 We started Omeprazole OTC 20 mg PO daily but take it 12 hrs apart from the iron dosing and do this for 2 weeks allowing time for her to get into see Dr Fuller Plan and get whatever he feels she needs. Tell her that if she gets more tired, more swollen, dizzy, feels "different" or Symptomatic Anemia issues she should call immediately.  They called tonight with a large clot admixed with her BM and more weak.  After debate we sent her to the ED for  Symptomatic Anemia and a GIB presumed d/t known AVMs for admission, +/- Blood transfusion, +/- IV Iron, IV Protonix, Serial bloodwork, GI consultation +/- EGD and a possible Colonoscopy - she is reluctant to do colonoscopy b/c last time she had one she woke up during procedure and that created a bad experience.    Any further information needed please call

## 2017-12-01 ENCOUNTER — Inpatient Hospital Stay (HOSPITAL_COMMUNITY)
Admission: EM | Admit: 2017-12-01 | Discharge: 2017-12-04 | DRG: 378 | Disposition: A | Discharge status: Home / Self Care | Payer: Medicare Other | Attending: Internal Medicine | Admitting: Internal Medicine

## 2017-12-01 ENCOUNTER — Encounter (HOSPITAL_COMMUNITY): Payer: Self-pay | Admitting: *Deleted

## 2017-12-01 ENCOUNTER — Other Ambulatory Visit: Payer: Self-pay

## 2017-12-01 DIAGNOSIS — K31819 Angiodysplasia of stomach and duodenum without bleeding: Secondary | ICD-10-CM

## 2017-12-01 DIAGNOSIS — D509 Iron deficiency anemia, unspecified: Secondary | ICD-10-CM | POA: Diagnosis not present

## 2017-12-01 DIAGNOSIS — E785 Hyperlipidemia, unspecified: Secondary | ICD-10-CM | POA: Diagnosis present

## 2017-12-01 DIAGNOSIS — F419 Anxiety disorder, unspecified: Secondary | ICD-10-CM | POA: Diagnosis present

## 2017-12-01 DIAGNOSIS — E876 Hypokalemia: Secondary | ICD-10-CM | POA: Diagnosis present

## 2017-12-01 DIAGNOSIS — E119 Type 2 diabetes mellitus without complications: Secondary | ICD-10-CM

## 2017-12-01 DIAGNOSIS — K922 Gastrointestinal hemorrhage, unspecified: Secondary | ICD-10-CM | POA: Diagnosis not present

## 2017-12-01 DIAGNOSIS — D500 Iron deficiency anemia secondary to blood loss (chronic): Secondary | ICD-10-CM

## 2017-12-01 DIAGNOSIS — E039 Hypothyroidism, unspecified: Secondary | ICD-10-CM | POA: Diagnosis not present

## 2017-12-01 DIAGNOSIS — D649 Anemia, unspecified: Secondary | ICD-10-CM | POA: Diagnosis present

## 2017-12-01 DIAGNOSIS — I639 Cerebral infarction, unspecified: Secondary | ICD-10-CM | POA: Diagnosis present

## 2017-12-01 DIAGNOSIS — E871 Hypo-osmolality and hyponatremia: Secondary | ICD-10-CM | POA: Diagnosis present

## 2017-12-01 DIAGNOSIS — I10 Essential (primary) hypertension: Secondary | ICD-10-CM | POA: Diagnosis present

## 2017-12-01 DIAGNOSIS — K621 Rectal polyp: Secondary | ICD-10-CM

## 2017-12-01 DIAGNOSIS — K5520 Angiodysplasia of colon without hemorrhage: Secondary | ICD-10-CM

## 2017-12-01 LAB — CBC
HEMATOCRIT: 25.7 % — AB (ref 36.0–46.0)
HEMATOCRIT: 26.2 % — AB (ref 36.0–46.0)
HEMOGLOBIN: 7.7 g/dL — AB (ref 12.0–15.0)
Hemoglobin: 8.3 g/dL — ABNORMAL LOW (ref 12.0–15.0)
MCH: 27.7 pg (ref 26.0–34.0)
MCH: 29.4 pg (ref 26.0–34.0)
MCHC: 30 g/dL (ref 30.0–36.0)
MCHC: 31.7 g/dL (ref 30.0–36.0)
MCV: 92.4 fL (ref 78.0–100.0)
MCV: 92.9 fL (ref 78.0–100.0)
PLATELETS: 232 10*3/uL (ref 150–400)
Platelets: 264 10*3/uL (ref 150–400)
RBC: 2.78 MIL/uL — AB (ref 3.87–5.11)
RBC: 2.82 MIL/uL — ABNORMAL LOW (ref 3.87–5.11)
RDW: 16.5 % — AB (ref 11.5–15.5)
RDW: 16.9 % — ABNORMAL HIGH (ref 11.5–15.5)
WBC: 7.4 10*3/uL (ref 4.0–10.5)
WBC: 8.1 10*3/uL (ref 4.0–10.5)

## 2017-12-01 LAB — CBG MONITORING, ED
GLUCOSE-CAPILLARY: 109 mg/dL — AB (ref 65–99)
GLUCOSE-CAPILLARY: 115 mg/dL — AB (ref 65–99)

## 2017-12-01 LAB — GLUCOSE, CAPILLARY
GLUCOSE-CAPILLARY: 146 mg/dL — AB (ref 65–99)
GLUCOSE-CAPILLARY: 112 mg/dL — AB (ref 65–99)

## 2017-12-01 LAB — APTT: aPTT: 22 seconds — ABNORMAL LOW (ref 24–36)

## 2017-12-01 LAB — PROTIME-INR
INR: 0.97
Prothrombin Time: 12.8 seconds (ref 11.4–15.2)

## 2017-12-01 LAB — PREPARE RBC (CROSSMATCH)

## 2017-12-01 LAB — OSMOLALITY: Osmolality: 276 mOsm/kg (ref 275–295)

## 2017-12-01 LAB — BRAIN NATRIURETIC PEPTIDE: B NATRIURETIC PEPTIDE 5: 34.7 pg/mL (ref 0.0–100.0)

## 2017-12-01 MED ORDER — SODIUM CHLORIDE 0.9 % IV SOLN: Freq: Once | INTRAVENOUS | Status: DC

## 2017-12-01 MED ORDER — FERROUS SULFATE 325 (65 FE) MG PO TABS
325.0000 mg | ORAL_TABLET | Freq: Three times a day (TID) | ORAL | Status: DC
  Administered 2017-12-01 – 2017-12-04 (×9): 325 mg via ORAL
  Filled 2017-12-01 (×10): qty 1

## 2017-12-01 MED ORDER — POTASSIUM CHLORIDE 20 MEQ/15ML (10%) PO SOLN
20.0000 meq | Freq: Once | ORAL | Status: AC
  Administered 2017-12-01: 20 meq via ORAL
  Filled 2017-12-01: qty 15

## 2017-12-01 MED ORDER — ONDANSETRON HCL 4 MG/2ML IJ SOLN
4.0000 mg | Freq: Four times a day (QID) | INTRAMUSCULAR | Status: DC | PRN
  Administered 2017-12-02: 4 mg via INTRAVENOUS
  Filled 2017-12-01: qty 2

## 2017-12-01 MED ORDER — ATORVASTATIN CALCIUM 20 MG PO TABS
20.0000 mg | ORAL_TABLET | Freq: Every day | ORAL | Status: DC
  Administered 2017-12-01 – 2017-12-04 (×4): 20 mg via ORAL
  Filled 2017-12-01 (×5): qty 1

## 2017-12-01 MED ORDER — BISACODYL 5 MG PO TBEC: 10.0000 mg | DELAYED_RELEASE_TABLET | Freq: Every day | ORAL | Status: DC | PRN

## 2017-12-01 MED ORDER — ALBUTEROL SULFATE (2.5 MG/3ML) 0.083% IN NEBU: 3.0000 mL | INHALATION_SOLUTION | Freq: Four times a day (QID) | RESPIRATORY_TRACT | Status: DC | PRN

## 2017-12-01 MED ORDER — AMLODIPINE BESYLATE 10 MG PO TABS
10.0000 mg | ORAL_TABLET | Freq: Every day | ORAL | Status: DC
  Administered 2017-12-01 – 2017-12-03 (×3): 10 mg via ORAL
  Filled 2017-12-01: qty 1
  Filled 2017-12-01: qty 2
  Filled 2017-12-01 (×2): qty 1

## 2017-12-01 MED ORDER — SODIUM CHLORIDE 0.9 % IV SOLN
INTRAVENOUS | Status: DC
  Administered 2017-12-01 – 2017-12-03 (×2): via INTRAVENOUS

## 2017-12-01 MED ORDER — SODIUM CHLORIDE 0.9 % IV SOLN
Freq: Once | INTRAVENOUS | Status: AC
  Administered 2017-12-01: 07:00:00 via INTRAVENOUS

## 2017-12-01 MED ORDER — SODIUM CHLORIDE 0.9 % IV SOLN
8.0000 mg/h | INTRAVENOUS | Status: DC
  Administered 2017-12-01: 8 mg/h via INTRAVENOUS
  Filled 2017-12-01 (×2): qty 80

## 2017-12-01 MED ORDER — ZOLPIDEM TARTRATE 5 MG PO TABS
5.0000 mg | ORAL_TABLET | Freq: Every evening | ORAL | Status: DC | PRN
  Administered 2017-12-03: 5 mg via ORAL
  Filled 2017-12-01: qty 1

## 2017-12-01 MED ORDER — SODIUM CHLORIDE 0.9 % IV SOLN
80.0000 mg | Freq: Once | INTRAVENOUS | Status: AC
  Administered 2017-12-01: 80 mg via INTRAVENOUS
  Filled 2017-12-01: qty 80

## 2017-12-01 MED ORDER — ACETAMINOPHEN 325 MG PO TABS
650.0000 mg | ORAL_TABLET | Freq: Four times a day (QID) | ORAL | Status: DC | PRN
  Administered 2017-12-02 – 2017-12-03 (×2): 650 mg via ORAL
  Filled 2017-12-01 (×2): qty 2

## 2017-12-01 MED ORDER — PEG-KCL-NACL-NASULF-NA ASC-C 100 G PO SOLR: 1.0000 | Freq: Once | ORAL | Status: DC

## 2017-12-01 MED ORDER — PANTOPRAZOLE SODIUM 40 MG IV SOLR: 40.0000 mg | Freq: Two times a day (BID) | INTRAVENOUS | Status: DC

## 2017-12-01 MED ORDER — TRIAMCINOLONE ACETONIDE 0.5 % EX CREA
1.0000 "application " | TOPICAL_CREAM | Freq: Two times a day (BID) | CUTANEOUS | Status: DC
  Administered 2017-12-01 – 2017-12-03 (×5): 1 via TOPICAL
  Filled 2017-12-01 (×2): qty 15

## 2017-12-01 MED ORDER — INSULIN ASPART 100 UNIT/ML ~~LOC~~ SOLN
0.0000 [IU] | Freq: Three times a day (TID) | SUBCUTANEOUS | Status: DC
  Administered 2017-12-01 – 2017-12-03 (×2): 1 [IU] via SUBCUTANEOUS

## 2017-12-01 MED ORDER — INSULIN ASPART 100 UNIT/ML ~~LOC~~ SOLN: 0.0000 [IU] | Freq: Every day | SUBCUTANEOUS | Status: DC

## 2017-12-01 MED ORDER — ACETAMINOPHEN 650 MG RE SUPP: 650.0000 mg | Freq: Four times a day (QID) | RECTAL | Status: DC | PRN

## 2017-12-01 MED ORDER — TRAZODONE HCL 50 MG PO TABS
50.0000 mg | ORAL_TABLET | Freq: Every day | ORAL | Status: DC
  Administered 2017-12-01 – 2017-12-03 (×2): 50 mg via ORAL
  Filled 2017-12-01 (×3): qty 1

## 2017-12-01 MED ORDER — LEVOTHYROXINE SODIUM 75 MCG PO TABS
150.0000 ug | ORAL_TABLET | Freq: Every day | ORAL | Status: DC
  Administered 2017-12-01 – 2017-12-04 (×4): 150 ug via ORAL
  Filled 2017-12-01: qty 1
  Filled 2017-12-01 (×3): qty 2

## 2017-12-01 MED ORDER — PANTOPRAZOLE SODIUM 40 MG IV SOLR
40.0000 mg | Freq: Two times a day (BID) | INTRAVENOUS | Status: DC
  Administered 2017-12-01 – 2017-12-03 (×6): 40 mg via INTRAVENOUS
  Filled 2017-12-01 (×7): qty 40

## 2017-12-01 MED ORDER — ONDANSETRON HCL 4 MG PO TABS: 4.0000 mg | ORAL_TABLET | Freq: Four times a day (QID) | ORAL | Status: DC | PRN

## 2017-12-01 MED ORDER — HYDRALAZINE HCL 20 MG/ML IJ SOLN: 5.0000 mg | INTRAMUSCULAR | Status: DC | PRN

## 2017-12-01 MED ORDER — ALPRAZOLAM 0.5 MG PO TABS
0.5000 mg | ORAL_TABLET | Freq: Three times a day (TID) | ORAL | Status: DC | PRN
  Administered 2017-12-01 – 2017-12-04 (×5): 0.5 mg via ORAL
  Filled 2017-12-01: qty 1
  Filled 2017-12-01: qty 2
  Filled 2017-12-01 (×3): qty 1

## 2017-12-01 MED ORDER — SODIUM CHLORIDE 0.9 % IV BOLUS (SEPSIS)
500.0000 mL | Freq: Once | INTRAVENOUS | Status: AC
  Administered 2017-12-01: 500 mL via INTRAVENOUS

## 2017-12-01 NOTE — ED Notes (Signed)
[name] (son)- [phone]

## 2017-12-01 NOTE — Consult Note (Addendum)
Referring Provider: Dr. Tawanna Solo Primary Care Physician:  Shon Baton, MD Primary Gastroenterologist:  Dr. Fuller Plan  Reason for Consultation:  GI bleed  HPI: [name] is a 84 y.o. 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  Information was obtained from both the patient and her son.  Patient has been feeling generalized weakness recently. She also has lightheadedness and mild shortness of breath when not at rest. No chest pain, cough, fever or chills. She was seen by her PCP recently and was found to have a decrease in her baseline Hgb.  She has been taking iron once daily, three days per week for a long time, but that was increased to daily and it was going to be arranged for her to have IV infusion and to see GI as outpatient.  She was also placed on PPI and her Aggrenox was discontinued (which she has been off of for a few days now).  Patient did not notice rectal bleeding until last evening when she had bright red bloody stool, which prompted her to come to the Ed.  This only occurred once.  Stools are usually darkish green from the iron.  Pt he does not have nausea, vomiting, or abdominal pain.  Say that appetite is good and she denies any weight loss.  She has some mild right-sided weakness from previous stroke, which has not changed.  ED Course: pt was found to have hemoglobin dropped from 10.9 on 07/17/17-->7.7, positive FOBT, WBC 7.4, sodium 127, creatinine normal, temperature normal, initially tachycardia, oxygen saturation 96% on room air. Patient is placed on telemetry bed for observation.  She has received one unit PRBC's so far and they are apparently planning for a  second unit.  Brittiny 2014 colonoscopy by Dr. Fuller Plan showed an AVM in the ascending colon. Texie 2014 EGD by Dr. Fuller Plan showed an AVM in the duodenal bulb and a 4 cm hiatal hernia.  **Of note, the above procedures were performed for issues with anemia and hemoccult positive stool at that time as well.  **FYI:  Patient woke up during last colonoscopy with Fentanyl and Versed and is terrified to wake up during procedure again, needs MAC.   Past Medical History:  Diagnosis Date  . Anemia   . Anxiety   . Colon polyps   . DDD (degenerative disc disease), cervical   . Diabetes mellitus   . Diabetes mellitus, type 2 ([place])   . Hyperlipidemia   . Hypertension   . Hypothyroidism   . Leukemia ([place]) in remission  . Mucous retention cyst of maxillary sinus   . Osteoporosis   . Pinched nerve In neck  . Plantar fasciitis   . Stroke ([place])   . Vitamin D deficiency     Past Surgical History:  Procedure Laterality Date  . APPENDECTOMY    . CHOLECYSTECTOMY     2004  . COLONOSCOPY N/A 02/24/2013   Procedure: COLONOSCOPY;  Surgeon: Ladene Artist, MD;  Location: [facility];  Service: Endoscopy;  Laterality: N/A;  . COLONOSCOPY W/ POLYPECTOMY    . ESOPHAGOGASTRODUODENOSCOPY    . ESOPHAGOGASTRODUODENOSCOPY N/A 02/24/2013   Procedure: ESOPHAGOGASTRODUODENOSCOPY (EGD);  Surgeon: Ladene Artist, MD;  Location: [facility];  Service: Endoscopy;  Laterality: N/A;  . EYE SURGERY  bilateral catracts    Prior to Admission medications   Medication Sig Start Date End Date Taking? Authorizing Provider  albuterol (PROVENTIL HFA;VENTOLIN HFA) 108 (90 Base) MCG/ACT inhaler Inhale 1-2 puffs into the lungs every 6 (six) hours as needed for wheezing or shortness of breath.   Yes [provider]  ALPRAZolam Duanne Moron) 0.5 MG tablet Take 0.5 mg by mouth 3 (three) times daily as needed. For anxiety   Yes [provider]  amLODipine (NORVASC) 2.5 MG tablet Take 10 mg by mouth daily.    Yes  [provider]  atorvastatin (LIPITOR) 20 MG tablet Take 20 mg by mouth daily.   Yes [provider]  betamethasone dipropionate (DIPROLENE) 0.05 % cream Apply 1 application topically 2 (two) times daily.   Yes [provider]  ferrous sulfate 325 (65 FE) MG EC tablet Take 325 mg by mouth 3 (three) times daily.    Yes [provider]  levothyroxine (SYNTHROID, LEVOTHROID) 150 MCG tablet Take 150 mcg by mouth daily.   Yes [provider]  olmesartan-hydrochlorothiazide (BENICAR HCT) 40-25 MG tablet Take 1 tablet by mouth daily. 10/15/17  Yes [provider]  omeprazole (PRILOSEC) 20 MG capsule Take 20 mg by mouth daily.   Yes [provider]  potassium chloride SA (K-DUR,KLOR-CON) 20 MEQ tablet Take 20 mEq by mouth daily.   Yes [provider]  traZODone (DESYREL) 50 MG tablet Take 50 mg by mouth at bedtime. 11/20/17  Yes [provider]    Current Facility-Administered Medications  Medication Dose Route Frequency Provider Last Rate Last Dose  . 0.9 %  sodium chloride infusion   Intravenous Continuous Ivor Costa, MD      . 0.9 %  sodium chloride infusion   Intravenous Once Ivor Costa, MD      . acetaminophen (TYLENOL) tablet 650 mg  650 mg Oral Q6H PRN Ivor Costa, MD       Or  . acetaminophen (TYLENOL) suppository 650 mg  650 mg Rectal Q6H PRN Ivor Costa, MD      . albuterol (PROVENTIL) (2.5 MG/3ML) 0.083% nebulizer solution 3 mL  3 mL Inhalation Q6H PRN Ivor Costa, MD      . ALPRAZolam Duanne Moron) tablet 0.5 mg  0.5 mg Oral TID PRN Ivor Costa, MD   0.5 mg at 12/01/17 0547  . amLODipine (NORVASC) tablet 10 mg  10 mg Oral Daily Ivor Costa, MD      . atorvastatin (LIPITOR) tablet 20 mg  20 mg Oral Daily Ivor Costa, MD      . betamethasone dipropionate (DIPROLENE) 2.87 % cream 1 application  1 application Topical BID Ivor Costa, MD      . ferrous sulfate EC tablet 325 mg  325 mg Oral TID Ivor Costa, MD      . hydrALAZINE  (APRESOLINE) injection 5 mg  5 mg Intravenous Q2H PRN Ivor Costa, MD      . insulin aspart (novoLOG) injection 0-5 Units  0-5 Units Subcutaneous QHS Ivor Costa, MD      . insulin aspart (novoLOG) injection 0-9 Units  0-9 Units Subcutaneous TID WC Ivor Costa, MD      . levothyroxine (SYNTHROID, LEVOTHROID) tablet 150 mcg  150 mcg Oral QAC breakfast Ivor Costa, MD      . ondansetron [facility]) tablet 4 mg  4 mg Oral Q6H PRN Ivor Costa, MD       Or  . ondansetron [facility]) injection 4 mg  4 mg Intravenous Q6H  PRN Ivor Costa, MD      . pantoprazole (PROTONIX) 80 mg in sodium chloride 0.9 % 250 mL (0.32 mg/mL) infusion  8 mg/hr Intravenous Continuous Ivor Costa, MD 25 mL/hr at 12/01/17 0649 8 mg/hr at 12/01/17 0649  . [START ON 12/04/2017] pantoprazole (PROTONIX) injection 40 mg  40 mg Intravenous Q12H Ivor Costa, MD      . traZODone (DESYREL) tablet 50 mg  50 mg Oral QHS Ivor Costa, MD      . zolpidem (AMBIEN) tablet 5 mg  5 mg Oral QHS PRN Ivor Costa, MD       Current Outpatient Medications  Medication Sig Dispense Refill  . albuterol (PROVENTIL HFA;VENTOLIN HFA) 108 (90 Base) MCG/ACT inhaler Inhale 1-2 puffs into the lungs every 6 (six) hours as needed for wheezing or shortness of breath.    . ALPRAZolam (XANAX) 0.5 MG tablet Take 0.5 mg by mouth 3 (three) times daily as needed. For anxiety    . amLODipine (NORVASC) 2.5 MG tablet Take 10 mg by mouth daily.     Marland Kitchen atorvastatin (LIPITOR) 20 MG tablet Take 20 mg by mouth daily.    . betamethasone dipropionate (DIPROLENE) 0.05 % cream Apply 1 application topically 2 (two) times daily.    . ferrous sulfate 325 (65 FE) MG EC tablet Take 325 mg by mouth 3 (three) times daily.     Marland Kitchen levothyroxine (SYNTHROID, LEVOTHROID) 150 MCG tablet Take 150 mcg by mouth daily.    Marland Kitchen olmesartan-hydrochlorothiazide (BENICAR HCT) 40-25 MG tablet Take 1 tablet by mouth daily.    Marland Kitchen omeprazole (PRILOSEC) 20 MG capsule Take 20 mg by mouth daily.    . potassium chloride SA  (K-DUR,KLOR-CON) 20 MEQ tablet Take 20 mEq by mouth daily.    . traZODone (DESYREL) 50 MG tablet Take 50 mg by mouth at bedtime.      Allergies as of 11/30/2017 - Review Complete 11/30/2017  Allergen Reaction Noted  . Prednisone  09/27/2011    Family History  Problem Relation Age of Onset  . Diabetes Unknown     Social History   Socioeconomic History  . Marital status: Widowed    Spouse name: Not on file  . Number of children: Not on file  . Years of education: Not on file  . Highest education level: Not on file  Occupational History  . Occupation: retired  Scientific laboratory technician  . Financial resource strain: Not on file  . Food insecurity:    Worry: Not on file    Inability: Not on file  . Transportation needs:    Medical: Not on file    Non-medical: Not on file  Tobacco Use  . Smoking status: Former Research scientist (life sciences)  . Smokeless tobacco: Never Used  Substance and Sexual Activity  . Alcohol use: No  . Drug use: No  . Sexual activity: Not Currently  Lifestyle  . Physical activity:    Days per week: Not on file    Minutes per session: Not on file  . Stress: Not on file  Relationships  . Social connections:    Talks on phone: Not on file    Gets together: Not on file    Attends religious service: Not on file    Active member of club or organization: Not on file    Attends meetings of clubs or organizations: Not on file    Relationship status: Not on file  . Intimate partner violence:    Fear of current or ex partner: Not on  file    Emotionally abused: Not on file    Physically abused: Not on file    Forced sexual activity: Not on file  Other Topics Concern  . Not on file  Social History Narrative  . Not on file    Review of Systems: ROS is O/W negative except as mentioned in HPI.  Physical Exam: Vital signs in last 24 hours: Temp:  [97.8 F (36.6 C)-98.5 F (36.9 C)] 97.8 F (36.6 C) (03/23 0815) Pulse Rate:  [80-120] 81 (03/23 0815) Resp:  [11-23] 16 (03/23  0815) BP: (118-164)/(36-74) 147/64 (03/23 0815) SpO2:  [95 %-99 %] 99 % (03/23 0815) Weight:  [162 lb (73.5 kg)] 162 lb (73.5 kg) (03/22 2200)   General:  Alert, Well-developed, well-nourished, pleasant and cooperative in NAD Head:  Normocephalic and atraumatic. Eyes:  Sclera clear, no icterus.  Conjunctiva pink. Ears:  Normal auditory acuity. Mouth:  No deformity or lesions.   Lungs:  Clear throughout to auscultation.  No wheezes, crackles, or rhonchi.  Heart:  Regular rate and rhythm; murmur noted. Abdomen:  Soft, non-distended.  BS present.  Non-tender. Rectal:  Deferred  Msk:  Symmetrical without gross deformities. Pulses:  Normal pulses noted. Extremities:  Without clubbing or edema. Neurologic:  Alert and oriented x 4;  grossly normal neurologically. Skin:  Intact without significant lesions or rashes. Psych:  Alert and cooperative. Normal mood and affect.  Intake/Output from previous day: 03/22 0701 - 03/23 0700 In: 600 [IV Piggyback:600] Out: -  Intake/Output this shift: Total I/O In: 306 [Blood:306] Out: -   Lab Results: Recent Labs    11/30/17 2229 12/01/17 0340 12/01/17 0618  WBC 7.0 7.4 8.1  HGB 8.3* 7.7* 8.3*  HCT 27.1* 25.7* 26.2*  PLT 320 264 232   BMET Recent Labs    11/30/17 2229  NA 127*  K 3.4*  CL 94*  CO2 22  GLUCOSE 218*  BUN 12  CREATININE 0.80  CALCIUM 8.6*   LFT Recent Labs    11/30/17 2229  PROT 6.0*  ALBUMIN 3.7  AST 22  ALT 16  ALKPHOS 81  BILITOT 0.3   PT/INR Recent Labs    12/01/17 0618  LABPROT 12.8  INR 0.97   IMPRESSION:  *GI bleed:  Suspect that she has a degree of chronic slow, intermittent blood loss possibly from the AVM's that she had previously.  ? If she could also have Cameron's erosions from her 4 cm hiatal hernia that was seen as well.  This acute episode could have been from the same or hemorrhoidal. *Acute on chronic anemia:  Hgb down about 3 grams from 4 months ago.  Has received one unit PRBC's  and apparently going to receive a second unit. *History of mild stroke:  Was on Aggrenox, but that has been on hold for a few days already.  PLAN: *Monitor hgb and transfuse further prn. *Will plan for EGD and colonoscopy with MAC sedation on 3/25. *Pantoprazole 40 mg IV BID is sufficient for now.  I have discontinued her PPI gtt. *Clear liquids only.   Laban Emperor. Zehr  12/01/2017, 9:02 AM  Pager number [phone]   I have reviewed the entire case in detail with the above APP and discussed the plan in detail.  Therefore, I agree with the diagnoses recorded above. In addition,  I have personally interviewed and examined the patient and have personally reviewed any abdominal/pelvic CT scan images.  My additional thoughts are as follows:  IDA of suspected chronic  GI blood loss - known SB and colon AVM from 2014 evaluation. Single episode small volume rectal bleeding last evening of unclear relation to the anemia.  She is receiving blood, and needs EGD/colonoscopy during admission.  May need APC ablation of AVMs if discovered.  Patient reports bad experience with incomplete sedation during last exams in 2014.  So we will plan for Monday when anesthesia service available for propofol.  This will also allow more time for transfusion and bowel preparation.  She is agreeable to that plan.  Son present for entire encounter.   Nelida Meuse III Pager [phone]  Mon-Fri 8a-5p [phone] after 5p, weekends, holidays

## 2017-12-01 NOTE — H&P (Signed)
History and Physical    [name] BJS:[id number] DOB: 1929-07-20 DOA: 12/01/2017  Referring MD/NP/PA:   PCP: Shon Baton, MD   Patient coming from:  The patient is coming from home.  At baseline, pt is partially dependent for most of ADL.   Chief Complaint: Lightheadedness, generalized weakness, shortness of breath  HPI: [name] is a 84 y.o. 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  Per patient's son, patient has been feeling generalized weakness recently. She also has lightheadedness and mild shortness rest. No chest pain, cough, fever or chills. She was seen by her PCP today, and was found to have low hemoglobin. Patient did not notice rectal bleeding until this evening when she had bright red bloody stool. Pt he does not have nausea, vomiting or abdominal pain and denies symptoms of UTI. She has some mild right-sided weakness from previous stroke, which has not changed.  # pt had EGD and colonoscopy on 02/24/13 by Dr. Fuller Plan, showed AVM in duodenal bulb and ascending colon.  ED Course: pt was found to have hemoglobin dropped from 10.9 on 07/17/17-->7.7, positive FOBT, WBC 7.4, sodium 127, creatinine normal, temperature normal, initially tachycardia, oxygen saturation 96% on room air. Patient is placed on telemetry bed for observation.  Review of Systems:   General: no fevers, chills, no body weight gain, has fatigue HEENT: no blurry vision, hearing changes or sore throat Respiratory: has dyspnea, no coughing, wheezing CV: no chest pain, no palpitations GI: no nausea, vomiting, abdominal pain, diarrhea, constipation. Has rectal bleeding. GU: no dysuria, burning on urination, increased urinary frequency, hematuria    Ext: no leg edema Neuro: no unilateral weakness, numbness, or tingling, no vision change or hearing loss. Has lightheadedness. Skin: no rash, no skin tear. MSK: No muscle spasm, no deformity, no limitation of range of movement in spin Heme: No easy bruising.  Travel history: No recent long distant travel.  Allergy:  Allergies  Allergen Reactions  . Prednisone     Climbs the walls    Past Medical History:  Diagnosis Date  . Anemia   . Anxiety   . Colon polyps   . DDD (degenerative disc disease), cervical   . Diabetes mellitus   . Diabetes mellitus, type 2 ([place])   . Hyperlipidemia   . Hypertension   . Hypothyroidism   . Leukemia ([place]) in remission  . Mucous retention cyst of maxillary sinus   . Osteoporosis   . Pinched nerve In neck  . Plantar fasciitis   . Stroke ([place])   . Vitamin D deficiency     Past Surgical History:  Procedure Laterality Date  . APPENDECTOMY    . CHOLECYSTECTOMY     2004  . COLONOSCOPY N/A 02/24/2013   Procedure: COLONOSCOPY;  Surgeon: Ladene Artist, MD;  Location: [facility];  Service: Endoscopy;  Laterality: N/A;  . COLONOSCOPY W/ POLYPECTOMY    . ESOPHAGOGASTRODUODENOSCOPY    . ESOPHAGOGASTRODUODENOSCOPY N/A 02/24/2013   Procedure: ESOPHAGOGASTRODUODENOSCOPY (EGD);  Surgeon: Ladene Artist, MD;  Location: [facility];  Service: Endoscopy;  Laterality: N/A;  . EYE SURGERY     bilateral catracts    Social History:  reports that she has quit smoking. She has never used smokeless tobacco. She reports that she does not drink alcohol or use drugs.  Family History:  Family History  Problem Relation Age of Onset  . Diabetes Unknown      Prior to Admission medications   Medication Sig Start Date End Date Taking? Authorizing Provider  albuterol (PROVENTIL HFA;VENTOLIN HFA) 108 (90 Base) MCG/ACT inhaler Inhale 1-2 puffs into the lungs every 6 (six) hours as needed for wheezing or shortness of breath.   Yes [provider]   ALPRAZolam Duanne Moron) 0.5 MG tablet Take 0.5 mg by mouth 3 (three) times daily as needed. For anxiety   Yes [provider]  amLODipine (NORVASC) 2.5 MG tablet Take 10 mg by mouth daily.    Yes [provider]  atorvastatin (LIPITOR) 20 MG tablet Take 20 mg by mouth daily.   Yes [provider]  betamethasone dipropionate (DIPROLENE) 0.05 % cream Apply 1 application topically 2 (two) times daily.   Yes [provider]  ferrous sulfate 325 (65 FE) MG EC tablet Take 325 mg by mouth 3 (three) times daily.    Yes [provider]  levothyroxine (SYNTHROID, LEVOTHROID) 150 MCG tablet Take 150 mcg by mouth daily.   Yes [provider]  olmesartan-hydrochlorothiazide (BENICAR HCT) 40-25 MG tablet Take 1 tablet by mouth daily. 10/15/17  Yes [provider]  omeprazole (PRILOSEC) 20 MG capsule Take 20 mg by mouth daily.   Yes [provider]  potassium chloride SA (K-DUR,KLOR-CON) 20 MEQ tablet Take 20 mEq by mouth daily.   Yes [provider]  traZODone (DESYREL) 50 MG tablet Take 50 mg by mouth at bedtime. 11/20/17  Yes [provider]    Physical Exam: Vitals:   12/01/17 0330 12/01/17 0400 12/01/17 0415 12/01/17 0430  BP: (!) 126/51 (!) 118/36 (!) 141/50 (!) 124/46  Pulse: 82 80 82 82  Resp: 13 11 14 11   Temp:      TempSrc:      SpO2: 97% 97% 97% 97%  Weight:      Height:       General: Not in acute distress. Pale looking, patient is anxious HEENT:       Eyes: PERRL, EOMI, no scleral icterus.       ENT: No discharge from the ears and nose, no pharynx injection, no tonsillar enlargement.        Neck: No JVD, no bruit, no mass felt. Heme: No neck lymph node enlargement. Cardiac: S1/S2, RRR, No murmurs, No gallops or rubs. Respiratory: No rales, wheezing, rhonchi or rubs. GI: Soft, nondistended, nontender, no rebound pain, no organomegaly, BS present. GU: No hematuria Ext: No pitting leg edema bilaterally.  2+DP/PT pulse bilaterally. Musculoskeletal: No joint deformities, No joint redness or warmth, no limitation of ROM in spin. Skin: No rashes.  Neuro: Alert, oriented X3, cranial nerves II-XII grossly intact, has mild left-sided weakness. Psych: Patient is not psychotic, no suicidal or hemocidal ideation.  Labs on Admission: I have personally reviewed following labs and imaging studies  CBC: Recent Labs  Lab 11/30/17 2229 12/01/17 0340  WBC 7.0 7.4  HGB 8.3* 7.7*  HCT 27.1* 25.7*  MCV 92.8 92.4  PLT 320 782   Basic Metabolic Panel: Recent Labs  Lab 11/30/17 2229  NA 127*  K 3.4*  CL 94*  CO2 22  GLUCOSE 218*  BUN 12  CREATININE 0.80  CALCIUM 8.6*   GFR: Estimated Creatinine Clearance: 45.2 mL/min (by C-G formula based on SCr of 0.8 mg/dL). Liver Function Tests: Recent Labs  Lab 11/30/17 2229  AST 22  ALT 16  ALKPHOS 81  BILITOT 0.3  PROT 6.0*  ALBUMIN 3.7   No results for input(s): LIPASE, AMYLASE in the last 168 hours. No results for input(s): AMMONIA in the last 168 hours. Coagulation Profile: No results for input(s): INR, PROTIME in the last 168 hours. Cardiac Enzymes: No results for input(s): CKTOTAL, CKMB, CKMBINDEX, TROPONINI in the last 168 hours. BNP (last 3 results) No results for input(s): PROBNP in the last 8760 hours. HbA1C: No results for input(s): HGBA1C in the last 72 hours. CBG: No results for input(s): GLUCAP in the last 168 hours. Lipid Profile: No results for input(s): CHOL, HDL, LDLCALC, TRIG, CHOLHDL, LDLDIRECT in the last 72 hours. Thyroid Function Tests: No results for input(s): TSH, T4TOTAL, FREET4, T3FREE, THYROIDAB in the last 72 hours. Anemia Panel: No results for input(s): VITAMINB12, FOLATE, FERRITIN, TIBC, IRON, RETICCTPCT in the last 72 hours. Urine analysis:    Component Value Date/Time   COLORURINE YELLOW 10/05/2008 1806   APPEARANCEUR CLEAR 10/05/2008 1806   LABSPEC 1.008 10/05/2008 1806   PHURINE 6.5 10/05/2008 1806    GLUCOSEU NEGATIVE 10/05/2008 1806   HGBUR NEGATIVE 10/05/2008 1806   BILIRUBINUR NEGATIVE 10/05/2008 1806   KETONESUR NEGATIVE 10/05/2008 1806   PROTEINUR NEGATIVE 10/05/2008 1806   UROBILINOGEN 0.2 10/05/2008 1806   NITRITE NEGATIVE 10/05/2008 1806   LEUKOCYTESUR MODERATE (A) 10/05/2008 1806   Sepsis Labs: @LABRCNTIP (procalcitonin:4,lacticidven:4) )No results found for this or any previous visit (from the past 240 hour(s)).   Radiological Exams on Admission: No results found.   EKG:  Not done in ED, will get one.   Assessment/Plan Principal Problem:   Symptomatic anemia Active Problems:   Iron deficiency anemia, unspecified   Hyperlipidemia   Anxiety   Stroke ([place])   Hypothyroidism   Diabetes mellitus without complication (HCC)   GIB (gastrointestinal bleeding)   Hyponatremia   Hypokalemia   HTN (hypertension)   Symptomatic anemia due to GIB: Pt had EGD and colonoscopy on 02/24/13 by Dr. Fuller Plan, showed AVM in duodenal bulb and ascending colon. Currently patient is hemodynamically stable.  - will place on tele bed for obs - transfuse 2 units of blood (radiated) - NPO - IVF: 500L NS bolus, then at 75 mL/hr - Start IV pantoprazole gtt - Zofran IV for nausea - Avoid NSAIDs and SQ heparin - Maintain IV access (2 large bore IVs if possible). - Monitor closely and follow q6h cbc, transfuse as necessary, if Hgb<7.0 - LaB: INR, PTT and type screen - please call GI in AM  HTN: -hold Benicar-HCTZ due to hyponatremia and risk of developing hypotension due to GI bleeding -Continue amlodipine -IVF hydralazine when necessary  HLD: -lipitor  Hypothyroidism: Last TSH was 0.927 on 07/10/17 -Continue home Synthroid  Hypokalemia: K= 3.4 on admission. - Repleted  Iron deficiency anemia: -continue iron supplement  Anxiety: -prn xanax  Diabetes mellitus without complication ([place]): diet controled. Last A1c 5.6 on 07/10/17, well controled. Patient is not taking meds at  home. Blood sugar 218. -SSI  Hyponatremia: Etiology is not clear, likely multifactorial etiology, including decreased oral intake and Benicar-HCTZ use. Mental status normal. - Will check urine sodium, urine osmolality, serum osmolality. - check TSH - IVF: 500 mL NS in ED, will continue with IV normal saline at 75 mL/h -hold Benicar-HCTZ - f/u BMP   Hx of stroke: has sequela of mild left-sided weakness. -on lipitor   DVT ppx: SCD Code Status: Full code Family Communication:   Yes, patient's son   at bed side Disposition Plan:  Anticipate discharge  back to previous home environment Consults called:  none Admission status: Obs / tele  Date of Service 12/01/2017    Ivor Costa [facility] Pager [phone]  If 7PM-7AM, please contact night-coverage [email or web address] Password [facility] 12/01/2017, 5:56 AM

## 2017-12-01 NOTE — Progress Notes (Signed)
Janasha R Arabie [id number] Admission Data: 12/01/2017 2:09 PM Attending Provider: Shelly Coss, MD  RVU:[id number], Jenny Reichmann, MD Consults/ Treatment Team: Treatment Team:  Doran Stabler, MD  Areli R Mcgirr is a 84 y.o. female patient admitted from ED awake, alert  & orientated  X 3,  Full Code, VSS - Blood pressure (!) 138/51, pulse 84, temperature 98.1 F (36.7 C), temperature source Oral, resp. rate 14, height 5' (1.524 m), weight 75.5 kg (166 lb 6.4 oz), SpO2 95 %.,  no c/o shortness of breath, no c/o chest pain, no distress noted. Tele # 6 placed and pt is currently running:normal sinus rhythm.   IV site WDL:  forearm right, condition patent and no redness with a transparent dsg that's clean dry and intact.  Allergies:   Allergies  Allergen Reactions  . Prednisone     Climbs the walls     Past Medical History:  Diagnosis Date  . Anemia   . Anxiety   . Colon polyps   . DDD (degenerative disc disease), cervical   . Diabetes mellitus   . Diabetes mellitus, type 2 ([place])   . Hyperlipidemia   . Hypertension   . Hypothyroidism   . Leukemia ([place]) in remission  . Mucous retention cyst of maxillary sinus   . Osteoporosis   . Pinched nerve In neck  . Plantar fasciitis   . Stroke ([place])   . Vitamin D deficiency     History:  obtained from the patient. Tobacco/alcohol: denied none  Pt orientation to unit, room and routine. Information packet given to patient/family and safety video watched.  Admission INP armband ID verified with patient/family, and in place. SR up x 2, fall risk assessment complete with Patient and family verbalizing understanding of risks associated with falls. Pt verbalizes an understanding of how to use the call bell and to call for help before getting out of bed.  Skin, clean-dry- intact without evidence of bruising, or skin tears.   No evidence of skin break down noted on exam. no rashes, no ecchymoses, no petechiae    Will cont to monitor and assist as  needed.  Chaundra Abreu Margaretha Sheffield, RN 12/01/2017 2:09 PM

## 2017-12-01 NOTE — ED Notes (Signed)
Pt's family request the nurse pull blood from IV.  States pt's is having anxiety at this time.  I will notify nurse.

## 2017-12-01 NOTE — ED Provider Notes (Signed)
[facility] EMERGENCY DEPARTMENT Provider Note   CSN: [id number] Arrival date & time: 11/30/17  2153     History   Chief Complaint Chief Complaint  Patient presents with  . Rectal Bleeding    HPI [name] is a 84 y.o. female.  HPI 84 yo female with a hx of lower GI AVM presents with anemia from the pcp office, lightheadedness and weakness with some bright red blood in the rectum this evening. Spoke with pcp who recommended evaluation in the ER. No abd pain. Denies n/v/d. Stool still brown. No other complaints   Past Medical History:  Diagnosis Date  . Anemia   . Anxiety   . Colon polyps   . DDD (degenerative disc disease), cervical   . Diabetes mellitus   . Diabetes mellitus, type 2 ([place])   . Hyperlipidemia   . Hypertension   . Hypothyroidism   . Leukemia ([place]) in remission  . Mucous retention cyst of maxillary sinus   . Osteoporosis   . Pinched nerve In neck  . Plantar fasciitis   . Stroke ([place])   . Vitamin D deficiency     Patient Active Problem List   Diagnosis Date Noted  . DM type 2 (diabetes mellitus, type 2) ([place]) 07/14/2017  . Acute pancreatitis 07/12/2017  . Elevated transaminase level 07/12/2017  . Chest pain 07/10/2017  . Hyperlipidemia   . Anxiety   . Stroke ([place])   . Hypothyroidism   . Iron deficiency anemia, unspecified 02/24/2013  . Nonspecific abnormal finding in stool contents 02/24/2013  . Personal history of colonic polyps 02/24/2013  . Anemia     Past Surgical History:  Procedure Laterality Date  . APPENDECTOMY    . CHOLECYSTECTOMY     2004  . COLONOSCOPY N/A 02/24/2013   Procedure: COLONOSCOPY;  Surgeon: Ladene Artist, MD;  Location: [facility] ENDOSCOPY;  Service: Endoscopy;  Laterality: N/A;  . COLONOSCOPY W/ POLYPECTOMY    . ESOPHAGOGASTRODUODENOSCOPY    . ESOPHAGOGASTRODUODENOSCOPY N/A 02/24/2013   Procedure: ESOPHAGOGASTRODUODENOSCOPY (EGD);  Surgeon: Ladene Artist, MD;  Location: [facility];  Service:  Endoscopy;  Laterality: N/A;  . EYE SURGERY     bilateral catracts     OB History   None      Home Medications    Prior to Admission medications   Medication Sig Start Date End Date Taking? Authorizing Provider  albuterol (PROVENTIL HFA;VENTOLIN HFA) 108 (90 Base) MCG/ACT inhaler Inhale 1-2 puffs into the lungs every 6 (six) hours as needed for wheezing or shortness of breath.   Yes [provider]  ALPRAZolam Duanne Moron) 0.5 MG tablet Take 0.5 mg by mouth 3 (three) times daily as needed. For anxiety   Yes [provider]  amLODipine (NORVASC) 2.5 MG tablet Take 10 mg by mouth daily.    Yes [provider]  atorvastatin (LIPITOR) 20 MG tablet Take 20 mg by mouth daily.   Yes [provider]  betamethasone dipropionate (DIPROLENE) 0.05 % cream Apply 1 application topically 2 (two) times daily.   Yes [provider]  ferrous sulfate 325 (65 FE) MG EC tablet Take 325 mg by mouth 3 (three) times daily.    Yes [provider]  levothyroxine (SYNTHROID, LEVOTHROID) 150 MCG tablet Take 150 mcg by mouth daily.   Yes [provider]  olmesartan-hydrochlorothiazide (BENICAR HCT) 40-25 MG tablet Take 1 tablet by mouth daily. 10/15/17  Yes [provider]  omeprazole (PRILOSEC) 20 MG capsule Take 20  mg by mouth daily.   Yes [provider]  potassium chloride SA (K-DUR,KLOR-CON) 20 MEQ tablet Take 20 mEq by mouth daily.   Yes [provider]  traZODone (DESYREL) 50 MG tablet Take 50 mg by mouth at bedtime. 11/20/17  Yes [provider]    Family History Family History  Problem Relation Age of Onset  . Diabetes Unknown     Social History Social History   Tobacco Use  . Smoking status: Former Research scientist (life sciences)  . Smokeless tobacco: Never Used  Substance Use Topics  . Alcohol use: No  . Drug use: No     Allergies   Prednisone   Review of Systems Review of Systems  All other systems reviewed and are  negative.    Physical Exam Updated Vital Signs BP (!) 124/46   Pulse 82   Temp 98.5 F (36.9 C)   Resp 11   Ht 5' (1.524 m)   Wt 73.5 kg (162 lb)   SpO2 97%   BMI 31.64 kg/m   Physical Exam  Constitutional: She is oriented to person, place, and time. She appears well-developed and well-nourished. No distress.  HENT:  Head: Normocephalic and atraumatic.  Eyes: EOM are normal.  Neck: Normal range of motion.  Cardiovascular: Normal rate, regular rhythm and normal heart sounds.  Pulmonary/Chest: Effort normal and breath sounds normal.  Abdominal: Soft. She exhibits no distension. There is no tenderness.  Genitourinary:  Genitourinary Comments: Small amount of bright red blood on rectal noted  Musculoskeletal: Normal range of motion.  Neurological: She is alert and oriented to person, place, and time.  Skin: Skin is warm and dry.  Psychiatric: She has a normal mood and affect. Judgment normal.  Nursing note and vitals reviewed.    ED Treatments / Results  Labs (all labs ordered are listed, but only abnormal results are displayed) Labs Reviewed  COMPREHENSIVE METABOLIC PANEL - Abnormal; Notable for the following components:      Result Value   Sodium 127 (*)    Potassium 3.4 (*)    Chloride 94 (*)    Glucose, Bld 218 (*)    Calcium 8.6 (*)    Total Protein 6.0 (*)    All other components within normal limits  CBC - Abnormal; Notable for the following components:   RBC 2.92 (*)    Hemoglobin 8.3 (*)    HCT 27.1 (*)    RDW 16.6 (*)    All other components within normal limits  CBC - Abnormal; Notable for the following components:   RBC 2.78 (*)    Hemoglobin 7.7 (*)    HCT 25.7 (*)    RDW 16.5 (*)    All other components within normal limits  POC OCCULT BLOOD, ED  TYPE AND SCREEN    EKG None  Radiology No results found.  Procedures Procedures (including critical care time)  Medications Ordered in ED Medications  sodium chloride 0.9 % bolus 500 mL  (has no administration in time range)     Initial Impression / Assessment and Plan / ED Course  I have reviewed the triage vital signs and the nursing notes.  Pertinent labs & imaging results that were available during my care of the patient were reviewed by me and considered in my medical decision making (see chart for details).     Trended hgb in the ER demonstrates fall from 8.3 to 7.7. Will admit for symptomatic anemia and hx of GI bleeds.   Final Clinical  Impressions(s) / ED Diagnoses   Final diagnoses:  Lower GI bleed    ED Discharge Orders    None       Jola Schmidt, MD 12/01/17 [phone]

## 2017-12-01 NOTE — Progress Notes (Signed)
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   Patient seen and examined the bedside this morning.  She remains comfortable.  She denies any nausea, vomiting or abdominal pain.  She is being transfused with 2 units of PRBC.  Her hemoglobin this morning is 8.3.  There is no more report of bloody bowel movement.  Patient and family member at the bedside reported that she had noticed some darkness to be mixed with blood for just one episode. I have requested for gastroenterology consult.  Patient was seen by gastroenterology this morning .She is being planned for EGD/colonoscopy on Monday.She has been started on clear liquid diet. Patient has been continued on PPI .  She was also noticed to have hyponatremia on presentation and she has been started on normal saline.Her mental status is on baseline.

## 2017-12-02 DIAGNOSIS — M810 Age-related osteoporosis without current pathological fracture: Secondary | ICD-10-CM | POA: Diagnosis present

## 2017-12-02 DIAGNOSIS — E785 Hyperlipidemia, unspecified: Secondary | ICD-10-CM | POA: Diagnosis present

## 2017-12-02 DIAGNOSIS — K922 Gastrointestinal hemorrhage, unspecified: Secondary | ICD-10-CM | POA: Diagnosis not present

## 2017-12-02 DIAGNOSIS — I69351 Hemiplegia and hemiparesis following cerebral infarction affecting right dominant side: Secondary | ICD-10-CM | POA: Diagnosis not present

## 2017-12-02 DIAGNOSIS — K625 Hemorrhage of anus and rectum: Secondary | ICD-10-CM | POA: Diagnosis not present

## 2017-12-02 DIAGNOSIS — K621 Rectal polyp: Secondary | ICD-10-CM | POA: Diagnosis not present

## 2017-12-02 DIAGNOSIS — I10 Essential (primary) hypertension: Secondary | ICD-10-CM | POA: Diagnosis present

## 2017-12-02 DIAGNOSIS — K5520 Angiodysplasia of colon without hemorrhage: Secondary | ICD-10-CM | POA: Diagnosis not present

## 2017-12-02 DIAGNOSIS — K219 Gastro-esophageal reflux disease without esophagitis: Secondary | ICD-10-CM | POA: Diagnosis present

## 2017-12-02 DIAGNOSIS — D122 Benign neoplasm of ascending colon: Secondary | ICD-10-CM | POA: Diagnosis not present

## 2017-12-02 DIAGNOSIS — K449 Diaphragmatic hernia without obstruction or gangrene: Secondary | ICD-10-CM | POA: Diagnosis not present

## 2017-12-02 DIAGNOSIS — E871 Hypo-osmolality and hyponatremia: Secondary | ICD-10-CM | POA: Diagnosis present

## 2017-12-02 DIAGNOSIS — D649 Anemia, unspecified: Secondary | ICD-10-CM

## 2017-12-02 DIAGNOSIS — E039 Hypothyroidism, unspecified: Secondary | ICD-10-CM | POA: Diagnosis present

## 2017-12-02 DIAGNOSIS — C9591 Leukemia, unspecified, in remission: Secondary | ICD-10-CM | POA: Diagnosis present

## 2017-12-02 DIAGNOSIS — M5030 Other cervical disc degeneration, unspecified cervical region: Secondary | ICD-10-CM | POA: Diagnosis present

## 2017-12-02 DIAGNOSIS — D509 Iron deficiency anemia, unspecified: Secondary | ICD-10-CM | POA: Diagnosis present

## 2017-12-02 DIAGNOSIS — D500 Iron deficiency anemia secondary to blood loss (chronic): Secondary | ICD-10-CM | POA: Diagnosis not present

## 2017-12-02 DIAGNOSIS — E1141 Type 2 diabetes mellitus with diabetic mononeuropathy: Secondary | ICD-10-CM | POA: Diagnosis present

## 2017-12-02 DIAGNOSIS — D128 Benign neoplasm of rectum: Secondary | ICD-10-CM | POA: Diagnosis not present

## 2017-12-02 DIAGNOSIS — F419 Anxiety disorder, unspecified: Secondary | ICD-10-CM | POA: Diagnosis present

## 2017-12-02 DIAGNOSIS — Z86010 Personal history of colonic polyps: Secondary | ICD-10-CM | POA: Diagnosis not present

## 2017-12-02 DIAGNOSIS — E876 Hypokalemia: Secondary | ICD-10-CM | POA: Diagnosis present

## 2017-12-02 DIAGNOSIS — D62 Acute posthemorrhagic anemia: Secondary | ICD-10-CM | POA: Diagnosis present

## 2017-12-02 DIAGNOSIS — Z888 Allergy status to other drugs, medicaments and biological substances status: Secondary | ICD-10-CM | POA: Diagnosis not present

## 2017-12-02 DIAGNOSIS — Z87891 Personal history of nicotine dependence: Secondary | ICD-10-CM | POA: Diagnosis not present

## 2017-12-02 DIAGNOSIS — K31819 Angiodysplasia of stomach and duodenum without bleeding: Secondary | ICD-10-CM | POA: Diagnosis not present

## 2017-12-02 DIAGNOSIS — E559 Vitamin D deficiency, unspecified: Secondary | ICD-10-CM | POA: Diagnosis present

## 2017-12-02 DIAGNOSIS — Z9049 Acquired absence of other specified parts of digestive tract: Secondary | ICD-10-CM | POA: Diagnosis not present

## 2017-12-02 DIAGNOSIS — D123 Benign neoplasm of transverse colon: Secondary | ICD-10-CM | POA: Diagnosis present

## 2017-12-02 LAB — GLUCOSE, CAPILLARY
GLUCOSE-CAPILLARY: 120 mg/dL — AB (ref 65–99)
GLUCOSE-CAPILLARY: 112 mg/dL — AB (ref 65–99)
GLUCOSE-CAPILLARY: 142 mg/dL — AB (ref 65–99)
Glucose-Capillary: 91 mg/dL (ref 65–99)

## 2017-12-02 LAB — CBC WITH DIFFERENTIAL/PLATELET
BASOS PCT: 1 %
Basophils Absolute: 0 10*3/uL (ref 0.0–0.1)
EOS ABS: 0.3 10*3/uL (ref 0.0–0.7)
EOS PCT: 5 %
HCT: 31.8 % — ABNORMAL LOW (ref 36.0–46.0)
HEMOGLOBIN: 9.9 g/dL — AB (ref 12.0–15.0)
Lymphocytes Relative: 19 %
Lymphs Abs: 1 10*3/uL (ref 0.7–4.0)
MCH: 28.9 pg (ref 26.0–34.0)
MCHC: 31.1 g/dL (ref 30.0–36.0)
MCV: 92.7 fL (ref 78.0–100.0)
MONOS PCT: 10 %
Monocytes Absolute: 0.6 10*3/uL (ref 0.1–1.0)
Neutro Abs: 3.5 10*3/uL (ref 1.7–7.7)
Neutrophils Relative %: 65 %
PLATELETS: 262 10*3/uL (ref 150–400)
RBC: 3.43 MIL/uL — ABNORMAL LOW (ref 3.87–5.11)
RDW: 17.5 % — ABNORMAL HIGH (ref 11.5–15.5)
WBC: 5.4 10*3/uL (ref 4.0–10.5)

## 2017-12-02 LAB — BASIC METABOLIC PANEL
Anion gap: 11 (ref 5–15)
BUN: 6 mg/dL (ref 6–20)
CALCIUM: 8.4 mg/dL — AB (ref 8.9–10.3)
CO2: 21 mmol/L — ABNORMAL LOW (ref 22–32)
CREATININE: 0.65 mg/dL (ref 0.44–1.00)
Chloride: 106 mmol/L (ref 101–111)
GFR calc non Af Amer: >60 mL/min (ref >60)
Glucose, Bld: 99 mg/dL (ref 65–99)
Potassium: 3.9 mmol/L (ref 3.5–5.1)
SODIUM: 138 mmol/L (ref 135–145)

## 2017-12-02 LAB — TSH: TSH: 0.673 u[IU]/mL (ref 0.350–4.500)

## 2017-12-02 MED ORDER — PEG-KCL-NACL-NASULF-NA ASC-C 100 G PO SOLR
0.5000 | Freq: Once | ORAL | Status: AC
  Administered 2017-12-02: 100 g via ORAL
  Filled 2017-12-02: qty 1

## 2017-12-02 MED ORDER — PEG-KCL-NACL-NASULF-NA ASC-C 100 G PO SOLR: 1.0000 | Freq: Once | ORAL | Status: DC

## 2017-12-02 MED ORDER — PEG-KCL-NACL-NASULF-NA ASC-C 100 G PO SOLR
0.5000 | Freq: Once | ORAL | Status: AC
  Administered 2017-12-03: 100 g via ORAL

## 2017-12-02 MED ORDER — SODIUM CHLORIDE 0.9 % IV SOLN
INTRAVENOUS | Status: DC
  Administered 2017-12-03: 04:00:00 via INTRAVENOUS

## 2017-12-02 NOTE — Progress Notes (Signed)
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   She remains comfortable. She  has been transfused with 2 units of PRBC. There is no more report of bloody bowel movement.   Patient was seen by gastroenterology this morning .She is being planned for EGD/colonoscopy on Monday  Assessment & Plan:   Principal Problem:   Symptomatic anemia Active Problems:   Iron deficiency anemia, unspecified   Hyperlipidemia   Anxiety   Stroke ([place])   Hypothyroidism   Diabetes mellitus without complication (H[facility])   GIB (gastrointestinal bleeding)   Hyponatremia   Hypokalemia   HTN (hypertension)  Anemia: Transfused with 2 units of PRBC.  Currently  H&H is stable.  We will transfuse as needed.  Patient is hemodynamically stable  Lower GI bleed: GI following.  Plan for EGD/colonoscopy tomorrow.  History of AVMs as per the EGD/colonoscopy  done on 02/24/2013.  N.p.o. after midnight.  Hypertension: Currently blood pressure stable.  Continue amlodipine.  Hyperlipidemia: Continue Lipitor  Hypothyroidism: TSH normal Continue home Synthroid  Hypokalemia: Supplemented and corrected  Iron deficiency anemia: Continue iron supplement  Anxiety: On prn xanax  Diabetes mellitus without complication :Last I7O 5.6 on 07/10/17, well controled. Patient is not taking meds at home.  Continue sliding scale insulin  Hyponatremia:  Much improved.Hyponatremia on admission ,etiology is not clear, likely multifactorial etiology, including decreased oral intake and  Benicar-HCTZ use. Mental status normal. Continue gentle IV fluids.  HCTZ on hold.  Hx of stroke: Has sequela of mild left-sided weakness.Continue  lipitor.Patient was on Aggrenox at home which has been held for the last few days.    DVT prophylaxis: SCD Code Status: Full Family Communication: Family/friend present at the bedside Disposition Plan: Home after EGD colonoscopy   Consultants: Gastroneurology  Procedures: None  Antimicrobials: None  Subjective: Patient seen and examined the bedside today.  Remains comfortable.  No new episodes of bloody bowel movement.  Hemodynamically stable.  Denies any abdominal pain.  Objective: Vitals:   12/01/17 1230 12/01/17 1317 12/01/17 2120 12/02/17 0552  BP: (!) 130/52 (!) 138/51 (!) 138/48 (!) 127/50  Pulse: 80 84 85 78  Resp: '16 14 20 18  ' Temp: 98.2 F (36.8 C) 98.1 F (36.7 C) 97.6 F (36.4 C) 97.9 F (36.6 C)  TempSrc: Oral Oral    SpO2: 98% 95% 96% 94%  Weight:  75.5 kg (166 lb 6.4 oz)    Height:  5' (1.524 m)      Intake/Output Summary (Last 24 hours) at 12/02/2017 1428 Last data filed at 12/02/2017 0900 Gross per 24 hour  Intake 1598.75 ml  Output 1550 ml  Net 48.75 ml   Filed Weights   11/30/17 2200 12/01/17 1317  Weight: 73.5 kg (162 lb) 75.5 kg (166 lb 6.4 oz)    Examination:  General exam: Appears calm and comfortable ,Not in distress,average built,elderly lady HEENT:PERRL,Oral mucosa moist, Ear/Nose normal on gross exam Respiratory system: Bilateral equal air entry, normal vesicular breath sounds, no wheezes or crackles  Cardiovascular system: S1 & S2 heard, RRR. No JVD, murmurs, rubs, gallops or clicks. No pedal edema. Gastrointestinal system: Abdomen is  nondistended, soft and nontender. No organomegaly or masses felt. Normal bowel sounds heard. Central nervous system: Alert and oriented. No focal neurological deficits. Extremities: No edema, no clubbing ,no cyanosis, distal peripheral pulses  palpable. Skin: No rashes, lesions or ulcers,no icterus ,no pallor MSK: Normal muscle bulk,tone ,power Psychiatry: Judgement and insight appear normal. Mood & affect appropriate.     Data Reviewed: I have personally reviewed following labs and imaging studies  CBC: Recent Labs  Lab 11/30/17 2229 12/01/17 0340 12/01/17 0618 12/02/17 0454  WBC 7.0 7.4 8.1 5.4  NEUTROABS  --   --   --  3.5  HGB 8.3* 7.7* 8.3* 9.9*  HCT 27.1* 25.7* 26.2* 31.8*  MCV 92.8 92.4 92.9 92.7  PLT 320 264 232 161   Basic Metabolic Panel: Recent Labs  Lab 11/30/17 2229 12/02/17 0454  NA 127* 138  K 3.4* 3.9  CL 94* 106  CO2 22 21*  GLUCOSE 218* 99  BUN 12 6  CREATININE 0.80 0.65  CALCIUM 8.6* 8.4*   GFR: Estimated Creatinine Clearance: 45.8 mL/min (by C-G formula based on SCr of 0.65 mg/dL). Liver Function Tests: Recent Labs  Lab 11/30/17 2229  AST 22  ALT 16  ALKPHOS 81  BILITOT 0.3  PROT 6.0*  ALBUMIN 3.7   No results for input(s): LIPASE, AMYLASE in the last 168 hours. No results for input(s): AMMONIA in the last 168 hours. Coagulation Profile: Recent Labs  Lab 12/01/17 0618  INR 0.97   Cardiac Enzymes: No results for input(s): CKTOTAL, CKMB, CKMBINDEX, TROPONINI in the last 168 hours. BNP (last 3 results) No results for input(s): PROBNP in the last 8760 hours. HbA1C: No results for input(s): HGBA1C in the last 72 hours. CBG: Recent Labs  Lab 12/01/17 1215 12/01/17 1711 12/01/17 2120 12/02/17 0805 12/02/17 1213  GLUCAP 115* 146* 112* 91 120*   Lipid Profile: No results for input(s): CHOL, HDL, LDLCALC, TRIG, CHOLHDL, LDLDIRECT in the last 72 hours. Thyroid Function Tests: Recent Labs    12/02/17 0454  TSH 0.673   Anemia Panel: No results for input(s): VITAMINB12, FOLATE, FERRITIN, TIBC, IRON, RETICCTPCT in the last 72 hours. Sepsis Labs: No results for input(s): PROCALCITON, LATICACIDVEN in the last 168 hours.  No results found for this or any previous  visit (from the past 240 hour(s)).       Radiology Studies: No results found.      Scheduled Meds: . amLODipine  10 mg Oral Daily  . atorvastatin  20 mg Oral Daily  . ferrous sulfate  325 mg Oral TID  . insulin aspart  0-5 Units Subcutaneous QHS  . insulin aspart  0-9 Units Subcutaneous TID WC  . levothyroxine  150 mcg Oral QAC breakfast  . pantoprazole (PROTONIX) IV  40 mg Intravenous Q12H  . peg 3350 powder  0.5 kit Oral Once   And  . [START ON 12/03/2017] peg 3350 powder  0.5 kit Oral Once  . traZODone  50 mg Oral QHS  . triamcinolone cream  1 application Topical BID   Continuous Infusions: . sodium chloride 75 mL/hr at 12/01/17 0800  . sodium chloride       LOS: 0 days    Time spent: More than 50% of that time was spent in counseling and/or coordination of care.      Shelly Coss, MD [facility] Pager [phone]  If 7PM-7AM, please contact night-coverage [email or web address] Password [facility] 12/02/2017, 2:28 PM

## 2017-12-02 NOTE — Progress Notes (Addendum)
     [facility] Progress Note  CC:  Anemia  Subjective:  Is tired but overall feels better since 2 units PRBC's.  Objective:  Vital signs in last 24 hours: Temp:  [97.6 F (36.4 C)-98.2 F (36.8 C)] 97.9 F (36.6 C) (03/24 0552) Pulse Rate:  [78-89] 78 (03/24 0552) Resp:  [12-20] 18 (03/24 0552) BP: (127-159)/(48-67) 127/50 (03/24 0552) SpO2:  [94 %-98 %] 94 % (03/24 0552) Weight:  [166 lb 6.4 oz (75.5 kg)] 166 lb 6.4 oz (75.5 kg) (03/23 1317) Last BM Date: 11/30/17 General:  Alert, Well-developed, in NAD Heart:  Regular rate and rhythm; no murmurs Pulm:  CTAB.  No increased WOB. Abdomen:  Soft, non-distended.  BS present.  Non-tender. Extremities:  Without edema. Neurologic:  Alert and oriented x 4;  grossly normal neurologically. Psych:  Alert and cooperative. Normal mood and affect.  Intake/Output from previous day: 03/23 0701 - 03/24 0700 In: 2432.3 [I.V.:1811.3; Blood:621] Out: 2450 [Urine:2450]  Lab Results: Recent Labs    12/01/17 0340 12/01/17 0618 12/02/17 0454  WBC 7.4 8.1 5.4  HGB 7.7* 8.3* 9.9*  HCT 25.7* 26.2* 31.8*  PLT 264 232 262   BMET Recent Labs    11/30/17 2229 12/02/17 0454  NA 127* 138  K 3.4* 3.9  CL 94* 106  CO2 22 21*  GLUCOSE 218* 99  BUN 12 6  CREATININE 0.80 0.65  CALCIUM 8.6* 8.4*   LFT Recent Labs    11/30/17 2229  PROT 6.0*  ALBUMIN 3.7  AST 22  ALT 16  ALKPHOS 81  BILITOT 0.3   PT/INR Recent Labs    12/01/17 0618  LABPROT 12.8  INR 0.97   Assessment / Plan: *GI bleed:  Suspect that she has a degree of chronic slow, intermittent blood loss possibly from the AVM's that she had previously.  ? If she could also have Cameron's erosions from her 4 cm hiatal hernia that was seen as well.  This acute episode could have been from the same or hemorrhoidal. *Acute on chronic anemia:  Hgb down about 3 grams from 4 months ago.  Has received two units PRBC's and Hgb responded nicely to 9.9 grams. *History  of mild stroke:  Was on Aggrenox, but that has been on hold for a few days already.  **EGD and colonoscopy on 3/25.  FYI:  Patient woke up during last colonoscopy with Fentanyl and Versed and is terrified to wake up during procedure again, needs MAC. *Monitor hgb and transfuse further prn.    LOS: 0 days   Jessica D. Zehr  12/02/2017, 8:51 AM  Pager number [phone]  No more rectal bleeding since the small volume episode that prompted admission. Hgb rose appropriately after Tfx PRBCs She has no new complaints.  EGD and colonoscopy with Dr. Jacobs tomorrow.  She is again agreeable.  The benefits and risks of the planned procedure were described in detail with the patient or (when appropriate) their health care proxy.  Risks were outlined as including, but not limited to, bleeding, infection, perforation, adverse medication reaction leading to cardiac or pulmonary decompensation, or pancreatitis (if ERCP).  The limitation of incomplete mucosal visualization was also discussed.  No guarantees or warranties were given. Patient at increased risk for cardiopulmonary complications of procedure due to medical comorbidities.   

## 2017-12-02 NOTE — H&P (View-Only) (Signed)
     [facility] Progress Note  CC:  Anemia  Subjective:  Is tired but overall feels better since 2 units PRBC's.  Objective:  Vital signs in last 24 hours: Temp:  [97.6 F (36.4 C)-98.2 F (36.8 C)] 97.9 F (36.6 C) (03/24 0552) Pulse Rate:  [78-89] 78 (03/24 0552) Resp:  [12-20] 18 (03/24 0552) BP: (127-159)/(48-67) 127/50 (03/24 0552) SpO2:  [94 %-98 %] 94 % (03/24 0552) Weight:  [166 lb 6.4 oz (75.5 kg)] 166 lb 6.4 oz (75.5 kg) (03/23 1317) Last BM Date: 11/30/17 General:  Alert, Well-developed, in NAD Heart:  Regular rate and rhythm; no murmurs Pulm:  CTAB.  No increased WOB. Abdomen:  Soft, non-distended.  BS present.  Non-tender. Extremities:  Without edema. Neurologic:  Alert and oriented x 4;  grossly normal neurologically. Psych:  Alert and cooperative. Normal mood and affect.  Intake/Output from previous day: 03/23 0701 - 03/24 0700 In: 2432.3 [I.V.:1811.3; Blood:621] Out: 2450 [Urine:2450]  Lab Results: Recent Labs    12/01/17 0340 12/01/17 0618 12/02/17 0454  WBC 7.4 8.1 5.4  HGB 7.7* 8.3* 9.9*  HCT 25.7* 26.2* 31.8*  PLT 264 232 262   BMET Recent Labs    11/30/17 2229 12/02/17 0454  NA 127* 138  K 3.4* 3.9  CL 94* 106  CO2 22 21*  GLUCOSE 218* 99  BUN 12 6  CREATININE 0.80 0.65  CALCIUM 8.6* 8.4*   LFT Recent Labs    11/30/17 2229  PROT 6.0*  ALBUMIN 3.7  AST 22  ALT 16  ALKPHOS 81  BILITOT 0.3   PT/INR Recent Labs    12/01/17 0618  LABPROT 12.8  INR 0.97   Assessment / Plan: *GI bleed:  Suspect that she has a degree of chronic slow, intermittent blood loss possibly from the AVM's that she had previously.  ? If she could also have Cameron's erosions from her 4 cm hiatal hernia that was seen as well.  This acute episode could have been from the same or hemorrhoidal. *Acute on chronic anemia:  Hgb down about 3 grams from 4 months ago.  Has received two units PRBC's and Hgb responded nicely to 9.9 grams. *History  of mild stroke:  Was on Aggrenox, but that has been on hold for a few days already.  **EGD and colonoscopy on 3/25.  FYI:  Patient woke up during last colonoscopy with Fentanyl and Versed and is terrified to wake up during procedure again, needs MAC. *Monitor hgb and transfuse further prn.    LOS: 0 days   Laban Emperor. Zehr  12/02/2017, 8:51 AM  Pager number [phone]  No more rectal bleeding since the small volume episode that prompted admission. Hgb rose appropriately after Tfx PRBCs She has no new complaints.  EGD and colonoscopy with Dr. Ardis Hughs tomorrow.  She is again agreeable.  The benefits and risks of the planned procedure were described in detail with the patient or (when appropriate) their health care proxy.  Risks were outlined as including, but not limited to, bleeding, infection, perforation, adverse medication reaction leading to cardiac or pulmonary decompensation, or pancreatitis (if ERCP).  The limitation of incomplete mucosal visualization was also discussed.  No guarantees or warranties were given. Patient at increased risk for cardiopulmonary complications of procedure due to medical comorbidities.

## 2017-12-03 ENCOUNTER — Inpatient Hospital Stay (HOSPITAL_COMMUNITY): Payer: Medicare Other | Admitting: Anesthesiology

## 2017-12-03 ENCOUNTER — Encounter (HOSPITAL_COMMUNITY)
Admission: EM | Disposition: A | Discharge status: Home / Self Care | Payer: Self-pay | Source: Home / Self Care | Attending: Internal Medicine

## 2017-12-03 ENCOUNTER — Encounter (HOSPITAL_COMMUNITY): Payer: Self-pay | Admitting: Certified Registered"

## 2017-12-03 DIAGNOSIS — K621 Rectal polyp: Secondary | ICD-10-CM

## 2017-12-03 DIAGNOSIS — K449 Diaphragmatic hernia without obstruction or gangrene: Secondary | ICD-10-CM

## 2017-12-03 DIAGNOSIS — K31819 Angiodysplasia of stomach and duodenum without bleeding: Secondary | ICD-10-CM

## 2017-12-03 DIAGNOSIS — K5520 Angiodysplasia of colon without hemorrhage: Secondary | ICD-10-CM

## 2017-12-03 HISTORY — PX: COLONOSCOPY WITH PROPOFOL: SHX5780

## 2017-12-03 HISTORY — PX: ESOPHAGOGASTRODUODENOSCOPY (EGD) WITH PROPOFOL: SHX5813

## 2017-12-03 LAB — CBC WITH DIFFERENTIAL/PLATELET
Basophils Absolute: 0 10*3/uL (ref 0.0–0.1)
Basophils Relative: 0 %
EOS PCT: 3 %
Eosinophils Absolute: 0.2 10*3/uL (ref 0.0–0.7)
HEMATOCRIT: 33.8 % — AB (ref 36.0–46.0)
Hemoglobin: 10.4 g/dL — ABNORMAL LOW (ref 12.0–15.0)
LYMPHS PCT: 17 %
Lymphs Abs: 1 10*3/uL (ref 0.7–4.0)
MCH: 28.6 pg (ref 26.0–34.0)
MCHC: 30.8 g/dL (ref 30.0–36.0)
MCV: 92.9 fL (ref 78.0–100.0)
MONOS PCT: 10 %
Monocytes Absolute: 0.6 10*3/uL (ref 0.1–1.0)
Neutro Abs: 4.1 10*3/uL (ref 1.7–7.7)
Neutrophils Relative %: 70 %
Platelets: 269 10*3/uL (ref 150–400)
RBC: 3.64 MIL/uL — ABNORMAL LOW (ref 3.87–5.11)
RDW: 17 % — AB (ref 11.5–15.5)
WBC: 5.8 10*3/uL (ref 4.0–10.5)

## 2017-12-03 LAB — GLUCOSE, CAPILLARY
Glucose-Capillary: 119 mg/dL — ABNORMAL HIGH (ref 65–99)
Glucose-Capillary: 106 mg/dL — ABNORMAL HIGH (ref 65–99)
Glucose-Capillary: 115 mg/dL — ABNORMAL HIGH (ref 65–99)
Glucose-Capillary: 132 mg/dL — ABNORMAL HIGH (ref 65–99)
Glucose-Capillary: 113 mg/dL — ABNORMAL HIGH (ref 65–99)

## 2017-12-03 SURGERY — ESOPHAGOGASTRODUODENOSCOPY (EGD) WITH PROPOFOL
Anesthesia: Monitor Anesthesia Care

## 2017-12-03 MED ORDER — PROPOFOL 10 MG/ML IV BOLUS
INTRAVENOUS | Status: DC | PRN
  Administered 2017-12-03 (×3): 10 mg via INTRAVENOUS

## 2017-12-03 MED ORDER — LACTATED RINGERS IV SOLN
INTRAVENOUS | Status: DC
Start: 2017-12-03 — End: 2017-12-03
  Administered 2017-12-03: 1000 mL via INTRAVENOUS

## 2017-12-03 MED ORDER — LIDOCAINE HCL (CARDIAC) 20 MG/ML IV SOLN
INTRAVENOUS | Status: DC | PRN
  Administered 2017-12-03: 20 mg via INTRAVENOUS

## 2017-12-03 MED ORDER — PROPOFOL 500 MG/50ML IV EMUL
INTRAVENOUS | Status: DC | PRN
  Administered 2017-12-03: 100 ug/kg/min via INTRAVENOUS

## 2017-12-03 MED ORDER — PHENYLEPHRINE HCL 10 MG/ML IJ SOLN
INTRAMUSCULAR | Status: DC | PRN
  Administered 2017-12-03 (×5): 80 ug via INTRAVENOUS

## 2017-12-03 MED ORDER — LACTATED RINGERS IV SOLN
INTRAVENOUS | Status: DC | PRN
  Administered 2017-12-03: 09:00:00 via INTRAVENOUS

## 2017-12-03 SURGICAL SUPPLY — 25 items

## 2017-12-03 NOTE — Anesthesia Postprocedure Evaluation (Signed)
Anesthesia Post Note  Patient: [name]  Procedure(s) Performed: ESOPHAGOGASTRODUODENOSCOPY (EGD) WITH PROPOFOL (N/A ) COLONOSCOPY WITH PROPOFOL (N/A )     Patient location during evaluation: PACU Anesthesia Type: MAC Level of consciousness: awake and alert Pain management: pain level controlled Vital Signs Assessment: post-procedure vital signs reviewed and stable Respiratory status: spontaneous breathing, nonlabored ventilation, respiratory function stable and patient connected to nasal cannula oxygen Cardiovascular status: stable and blood pressure returned to baseline Postop Assessment: no apparent nausea or vomiting Anesthetic complications: no    Last Vitals:  Vitals:   12/03/17 1008 12/03/17 1018  BP: (!) 96/31 (!) 134/57  Pulse: 74 81  Resp: 11 16  Temp: 36.6 C   SpO2: 94% 96%    Last Pain:  Vitals:   12/03/17 1008  TempSrc: Oral  PainSc: 0-No pain                 Tiajuana Amass

## 2017-12-03 NOTE — Anesthesia Preprocedure Evaluation (Addendum)
Anesthesia Evaluation  Patient identified by MRN, date of birth, ID band Patient awake    Reviewed: Allergy & Precautions, NPO status , Patient's Chart, lab work & pertinent test results  Airway Mallampati: II  TM Distance: >3 FB Neck ROM: Full    Dental   Pulmonary former smoker,    breath sounds clear to auscultation       Cardiovascular hypertension, Pt. on medications  Rhythm:Regular Rate:Normal     Neuro/Psych Anxiety CVA    GI/Hepatic Neg liver ROS, GERD  ,GI bleed   Endo/Other  diabetes, Type 2Hypothyroidism   Renal/GU negative Renal ROS     Musculoskeletal  (+) Arthritis ,   Abdominal   Peds  Hematology  (+) anemia ,   Anesthesia Other Findings   Reproductive/Obstetrics                             Lab Results  Component Value Date   WBC 5.4 12/02/2017   HGB 9.9 (L) 12/02/2017   HCT 31.8 (L) 12/02/2017   MCV 92.7 12/02/2017   PLT 262 12/02/2017   Lab Results  Component Value Date   CREATININE 0.65 12/02/2017   BUN 6 12/02/2017   NA 138 12/02/2017   K 3.9 12/02/2017   CL 106 12/02/2017   CO2 21 (L) 12/02/2017    Anesthesia Physical Anesthesia Plan  ASA: III  Anesthesia Plan: MAC   Post-op Pain Management:    Induction: Intravenous  PONV Risk Score and Plan: 2 and Propofol infusion, Ondansetron and Treatment may vary due to age or medical condition  Airway Management Planned: Natural Airway, Simple Face Mask and Nasal Cannula  Additional Equipment:   Intra-op Plan:   Post-operative Plan:   Informed Consent: I have reviewed the patients History and Physical, chart, labs and discussed the procedure including the risks, benefits and alternatives for the proposed anesthesia with the patient or authorized representative who has indicated his/her understanding and acceptance.     Plan Discussed with: CRNA  Anesthesia Plan Comments:         Anesthesia  Quick Evaluation

## 2017-12-03 NOTE — Progress Notes (Signed)
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   She remains comfortable. She  has been transfused with 2 units of PRBC. There is no more report of bloody bowel movement.   Patient was seen by gastroenterology  .She underwent  EGD/colonoscopy today.  Assessment & Plan:   Principal Problem:   Symptomatic anemia Active Problems:   Iron deficiency anemia, unspecified   Hyperlipidemia   Anxiety   Stroke ([place])   Hypothyroidism   Diabetes mellitus without complication (HCC)   GIB (gastrointestinal bleeding)   Hyponatremia   Hypokalemia   HTN (hypertension)   Lower GI bleed   Polyp of rectum   AVM (arteriovenous malformation) of colon   AVM (arteriovenous malformation) of duodenum, acquired  Anemia: Transfused with 2 units of PRBC.  Currently  H&H is stable.  We will transfuse as needed.  Patient is hemodynamically stable  Lower GI bleed: GI following.  History of AVMs as per the EGD/colonoscopy  done on 02/24/2013.    Underwent  EGD/colonoscopy today.  Colonoscopy showed:Found to have polyps in transverse colon, rectum. EGD showed : 1 small duodenal bulb AVM.  Status post APC application. Diet will be advanced as tolerated.  DC planning tomorrow.  GI is okay to resume her Aggrenox. Continue iron supplementation.  Check CBC as an outpatient.  Hypertension: Currently blood pressure stable.  Continue amlodipine.  Hyperlipidemia: Continue Lipitor  Hypothyroidism: TSH normal Continue home Synthroid  Hypokalemia: Supplemented and  corrected  Iron deficiency anemia: Continue iron supplement  Anxiety: On prn xanax  Diabetes mellitus without complication :Last E0P 5.6 on 07/10/17, well controled. Patient is not taking meds at home.  Continue sliding scale insulin  Hyponatremia:  Much improved.Hyponatremia on admission ,etiology is not clear, likely multifactorial etiology, including decreased oral intake and Benicar-HCTZ use. Mental status normal. Continue gentle IV fluids.  HCTZ on hold.  Hx of stroke: Has sequela of mild left-sided weakness.Continue  lipitor.Patient was on Aggrenox at home which has been held for the last few days.    DVT prophylaxis: SCD Code Status: Full Family Communication: Family present at the bedside Disposition Plan: Home tomorrow  Consultants: Gastroenterology  Procedures: None  Antimicrobials: None  Subjective: Patient seen and examined the bedside today.  Remains comfortable.  No new episodes of bloody bowel movement.  Hemodynamically stable.  Denies any abdominal pain.  Waiting for EGD/colonoscopy  Objective: Vitals:   12/03/17 0436 12/03/17 0829 12/03/17 1008 12/03/17 1018  BP: (!) 123/52 (!) 150/51 (!) 96/31 (!) 134/57  Pulse: 96 93 74 81  Resp: 18 17 11 16   Temp: 97.8 F (36.6 C) 98.3 F (36.8 C) 97.8 F (36.6 C)   TempSrc: Oral Oral Oral   SpO2: 97% 94% 94% 96%  Weight:  75.3 kg (166 lb)    Height:  5' (1.524 m)      Intake/Output Summary (Last 24 hours) at 12/03/2017 1228 Last data filed at 12/03/2017 1009 Gross per 24 hour  Intake 400 ml  Output 3201 ml  Net -2801 ml   Filed Weights   11/30/17 2200 12/01/17 1317 12/03/17 0829  Weight: 73.5 kg (162  lb) 75.5 kg (166 lb 6.4 oz) 75.3 kg (166 lb)    Examination:  General exam: Appears calm and comfortable ,Not in distress,average built, elderly female HEENT:PERRL,Oral mucosa moist, Ear/Nose normal on gross exam Respiratory system: Bilateral equal air entry, normal vesicular breath sounds, no wheezes or  crackles  Cardiovascular system: S1 & S2 heard, RRR. No JVD, murmurs, rubs, gallops or clicks. Gastrointestinal system: Abdomen is nondistended, soft and nontender. No organomegaly or masses felt. Normal bowel sounds heard. Central nervous system: Alert and oriented. No focal neurological deficits. Extremities: No edema, no clubbing ,no cyanosis, distal peripheral pulses palpable. Skin: No rashes, lesions or ulcers,no icterus ,no pallor MSK: Normal muscle bulk,tone ,power Psychiatry: Judgement and insight appear normal. Mood & affect appropriate.      Data Reviewed: I have personally reviewed following labs and imaging studies  CBC: Recent Labs  Lab 11/30/17 2229 12/01/17 0340 12/01/17 0618 12/02/17 0454 12/03/17 0724  WBC 7.0 7.4 8.1 5.4 5.8  NEUTROABS  --   --   --  3.5 4.1  HGB 8.3* 7.7* 8.3* 9.9* 10.4*  HCT 27.1* 25.7* 26.2* 31.8* 33.8*  MCV 92.8 92.4 92.9 92.7 92.9  PLT 320 264 232 262 626   Basic Metabolic Panel: Recent Labs  Lab 11/30/17 2229 12/02/17 0454  NA 127* 138  K 3.4* 3.9  CL 94* 106  CO2 22 21*  GLUCOSE 218* 99  BUN 12 6  CREATININE 0.80 0.65  CALCIUM 8.6* 8.4*   GFR: Estimated Creatinine Clearance: 45.7 mL/min (by C-G formula based on SCr of 0.65 mg/dL). Liver Function Tests: Recent Labs  Lab 11/30/17 2229  AST 22  ALT 16  ALKPHOS 81  BILITOT 0.3  PROT 6.0*  ALBUMIN 3.7   No results for input(s): LIPASE, AMYLASE in the last 168 hours. No results for input(s): AMMONIA in the last 168 hours. Coagulation Profile: Recent Labs  Lab 12/01/17 0618  INR 0.97   Cardiac Enzymes: No results for input(s): CKTOTAL, CKMB, CKMBINDEX, TROPONINI in the last 168 hours. BNP (last 3 results) No results for input(s): PROBNP in the last 8760 hours. HbA1C: No results for input(s): HGBA1C in the last 72 hours. CBG: Recent Labs  Lab 12/02/17 1703 12/02/17 2157 12/03/17 0811 12/03/17 0841 12/03/17 1201  GLUCAP 112* 142* 119* 106* 115*   Lipid  Profile: No results for input(s): CHOL, HDL, LDLCALC, TRIG, CHOLHDL, LDLDIRECT in the last 72 hours. Thyroid Function Tests: Recent Labs    12/02/17 0454  TSH 0.673   Anemia Panel: No results for input(s): VITAMINB12, FOLATE, FERRITIN, TIBC, IRON, RETICCTPCT in the last 72 hours. Sepsis Labs: No results for input(s): PROCALCITON, LATICACIDVEN in the last 168 hours.  No results found for this or any previous visit (from the past 240 hour(s)).       Radiology Studies: No results found.      Scheduled Meds: . amLODipine  10 mg Oral Daily  . atorvastatin  20 mg Oral Daily  . ferrous sulfate  325 mg Oral TID  . insulin aspart  0-5 Units Subcutaneous QHS  . insulin aspart  0-9 Units Subcutaneous TID WC  . levothyroxine  150 mcg Oral QAC breakfast  . pantoprazole (PROTONIX) IV  40 mg Intravenous Q12H  . traZODone  50 mg Oral QHS  . triamcinolone cream  1 application Topical BID   Continuous Infusions: . sodium chloride 75 mL/hr at 12/03/17 0520  . sodium chloride       LOS: 1 day  Time spent:25 mins. More than 50% of that time was spent in counseling and/or coordination of care.  Please Note: This patient record was dictated using Editor, commissioning. Chart creation errors have been sought, but may not always have been located. Such creation errors do not reflect on the Standard of Medical Care.     Shelly Coss, MD [facility] Pager [phone]  If 7PM-7AM, please contact night-coverage [email or web address] Password [facility] 12/03/2017, 12:28 PM

## 2017-12-03 NOTE — Op Note (Addendum)
[facility] Patient Name: [name] Procedure Date : 12/03/2017 MRN: [id number] Attending MD: [name] , MD Date of Birth: Jan 28, 1929 CSN: [id number] Age: 84 Admit Type: Inpatient Procedure:                Upper GI endoscopy Indications:              Iron deficiency anemia Providers:                [name], MD, Angus Seller, Tinnie Gens,                            Technician Referring MD:              Medicines:                Monitored Anesthesia Care Complications:            No immediate complications. Estimated blood loss:                            None. Estimated Blood Loss:     Estimated blood loss: none. Procedure:                Pre-Anesthesia Assessment:                           - Prior to the procedure, a History and Physical                            was performed, and patient medications and                            allergies were reviewed. The patient's tolerance of                            previous anesthesia was also reviewed. The risks                            and benefits of the procedure and the sedation                            options and risks were discussed with the patient.                            All questions were answered, and informed consent                            was obtained. Prior Anticoagulants: The patient has                            taken no previous anticoagulant or antiplatelet                            agents. ASA Grade Assessment: III - A patient with                            severe  systemic disease. After reviewing the risks                            and benefits, the patient was deemed in                            satisfactory condition to undergo the procedure.                           After obtaining informed consent, the endoscope was                            passed under direct vision. Throughout the                            procedure, the patient's blood pressure, pulse, and                             oxygen saturations were monitored continuously. The                            [id number] ([id number]) scope was introduced through the                            mouth, and advanced to the second part of duodenum.                            The upper GI endoscopy was accomplished without                            difficulty. The patient tolerated the procedure                            well. Scope In: Scope Out: Findings:      There was no blood in the UGI tract.      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 Coagulation for tissue destruction using argon       plasma was successful.      Small hiatal hernia without evident Cameron's erosions.      The examination was otherwise normal. 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                           - Small hiatal hernia without associated erosions.                           - The duodenal AVM may have contributed to her  anemia. Moderate Sedation:      none Recommendation:           - Advance diet as tolerated.                           - OK to d/c tomorrow if no recurrent bleeding.                           - OK to resume her aggrenox (if necessary) tomorrow.                           - Will need labs (CBC) in 3-4 weeks.                           - Iron supplement orally, once daily. Procedure Code(s):        --- Professional ---                           [phone], Esophagogastroduodenoscopy, flexible,                            transoral; with control of bleeding, any method Diagnosis Code(s):        --- Professional ---                           G01.749, Angiodysplasia of stomach and duodenum                            without bleeding                           D50.9, Iron deficiency anemia, unspecified CPT copyright 2016 American  Medical Association. All rights reserved. The codes documented in this report are preliminary and upon coder review may  be revised to meet current compliance requirements. [name], MD 12/03/2017 10:04:01 AM This report has been signed electronically. Number of Addenda: 0

## 2017-12-03 NOTE — Progress Notes (Signed)
At 0440 this morning, patient went back into NSR. Will continue to monitor and treat per MD orders.

## 2017-12-03 NOTE — Op Note (Addendum)
[facility] Patient Name: [name] Procedure Date : 12/03/2017 MRN: [id number] Attending MD: [name] , MD Date of Birth: [birth year] CSN: [id number] Age: 84 Admit Type: Inpatient Procedure:                Colonoscopy Indications:              Rectal bleeding, IDA Providers:                [name], MD, Angus Seller, Tinnie Gens,                            Technician, Phill Myron. Proofreader, CRNA Referring MD:              Medicines:                Monitored Anesthesia Care Complications:            No immediate complications. Estimated blood loss:                            None. Estimated Blood Loss:     Estimated blood loss: none. Procedure:                Pre-Anesthesia Assessment:                           - Prior to the procedure, a History and Physical                            was performed, and patient medications and                            allergies were reviewed. The patient's tolerance of                            previous anesthesia was also reviewed. The risks                            and benefits of the procedure and the sedation                            options and risks were discussed with the patient.                            All questions were answered, and informed consent                            was obtained. Prior Anticoagulants: The patient has                            taken no previous anticoagulant or antiplatelet                            agents. ASA Grade Assessment: III - A patient with  severe systemic disease. After reviewing the risks                            and benefits, the patient was deemed in                            satisfactory condition to undergo the procedure.                           After obtaining informed consent, the colonoscope                            was passed under direct vision. Throughout the                            procedure, the patient's  blood pressure, pulse, and                            oxygen saturations were monitored continuously. The                            [id number] ([id number]) scope was introduced through                            the anus and advanced to the the cecum, identified                            by appendiceal orifice and ileocecal valve. The                            colonoscopy was performed without difficulty. The                            patient tolerated the procedure well. The quality                            of the bowel preparation was good. The ileocecal                            valve, appendiceal orifice, and rectum were                            photographed. Scope In: 9:18:07 AM Scope Out: 9:41:21 AM Scope Withdrawal Time: 0 hours 16 minutes 31 seconds  Total Procedure Duration: 0 hours 23 minutes 14 seconds  Findings:      A 5 mm polyp was found in the transverse colon. The polyp was sessile.       The polyp was removed with a cold snare. Resection and retrieval were       complete. Jar 1      A 15 mm polyp was found in the very distal rectum. The polyp was       semi-pedunculated. The polyp was removed with a hot snare. Resection and       retrieval were complete. Jar 2  One small to medium sized, non-bleeding angiodysplastic lesion without       bleeding was found in the cecum. Coagulation for bleeding prevention       using argon plasma was successful. The AVM bled slightly during       treatment with APC.      The exam was otherwise without abnormality on direct and retroflexion       views. Impression:               - One 5 mm polyp in the transverse colon, removed                            with a cold snare. Resected and retrieved.                           - One 15 mm polyp in the very distal rectum,                            removed with a hot snare. Resected and retrieved.                           - One non-bleeding colonic angiodysplastic lesion.                             Treated with argon plasma coagulation (APC) for                            destruction, the lesion bled slightly during                            treatment.                           - The examination was otherwise normal on direct                            and retroflexion views.                           - The cecal AVM and/or the medium sized rectal                            polyp may have contributed to her anemia. The                            rectal polyp almost certainly explains her minor                            rectal bleeding. Moderate Sedation:      none Recommendation:           - EGD now to continue IDA workup.                           - Await final path results of the polyps removed  today. Given her age, probably no need for                            surveillance colonoscopies. Procedure Code(s):        --- Professional ---                           [phone], 59, Colonoscopy, flexible; with control of                            bleeding, any method                           45385, Colonoscopy, flexible; with removal of                            tumor(s), polyp(s), or other lesion(s) by snare                            technique Diagnosis Code(s):        --- Professional ---                           D12.3, Benign neoplasm of transverse colon (hepatic                            flexure or splenic flexure)                           K62.1, Rectal polyp                           K55.20, Angiodysplasia of colon without hemorrhage                           K62.5, Hemorrhage of anus and rectum CPT copyright 2016 American Medical Association. All rights reserved. The codes documented in this report are preliminary and upon coder review may  be revised to meet current compliance requirements. [name], MD 12/03/2017 9:50:17 AM This report has been signed electronically. Number of Addenda: 0

## 2017-12-03 NOTE — Interval H&P Note (Signed)
History and Physical Interval Note:  12/03/2017 8:42 AM  [name]  has presented today for surgery, with the diagnosis of Anemia and heme positive stool, history of AVM's  The various methods of treatment have been discussed with the patient and family. After consideration of risks, benefits and other options for treatment, the patient has consented to  Procedure(s): ESOPHAGOGASTRODUODENOSCOPY (EGD) WITH PROPOFOL (N/A) COLONOSCOPY WITH PROPOFOL (N/A) as a surgical intervention .  The patient's history has been reviewed, patient examined, no change in status, stable for surgery.  I have reviewed the patient's chart and labs.  Questions were answered to the patient's satisfaction.     [name]

## 2017-12-03 NOTE — Progress Notes (Signed)
At 12:40AM patient went from sinus rhythm to Afib. During this time, she was lying in the bed asleep. Patient's HR maintaining in the 70s-80s and BP is 129/54. Patient says she does not feel any different than she did earlier in the night. Blount,NP notified about patient change and she said just to continue to monitoring her since the patient HR is controlled at this time. Will continue to monitor and treat per MD orders.

## 2017-12-03 NOTE — Transfer of Care (Signed)
Immediate Anesthesia Transfer of Care Note  Patient: [name]  Procedure(s) Performed: ESOPHAGOGASTRODUODENOSCOPY (EGD) WITH PROPOFOL (N/A ) COLONOSCOPY WITH PROPOFOL (N/A )  Patient Location: Endoscopy Unit  Anesthesia Type:MAC  Level of Consciousness: drowsy and patient cooperative  Airway & Oxygen Therapy: Patient Spontanous Breathing and Patient connected to nasal cannula oxygen  Post-op Assessment: Report given to RN, Post -op Vital signs reviewed and stable and Patient moving all extremities X 4  Post vital signs: Reviewed and stable  Last Vitals:  Vitals Value Taken Time  BP 96/31 12/03/2017 10:08 AM  Temp    Pulse 71 12/03/2017 10:08 AM  Resp 9 12/03/2017 10:08 AM  SpO2 95 % 12/03/2017 10:08 AM  Vitals shown include unvalidated device data.  Last Pain:  Vitals:   12/03/17 0829  TempSrc: Oral  PainSc: 0-No pain         Complications: No apparent anesthesia complications

## 2017-12-04 ENCOUNTER — Other Ambulatory Visit (HOSPITAL_COMMUNITY): Payer: Self-pay

## 2017-12-04 DIAGNOSIS — K621 Rectal polyp: Secondary | ICD-10-CM

## 2017-12-04 DIAGNOSIS — D122 Benign neoplasm of ascending colon: Secondary | ICD-10-CM

## 2017-12-04 LAB — TYPE AND SCREEN
ABO/RH(D): O POS
ANTIBODY SCREEN: NEGATIVE
UNIT DIVISION: 0
UNIT DIVISION: 0
Unit division: 0

## 2017-12-04 LAB — BPAM RBC
BLOOD PRODUCT EXPIRATION DATE: 201904042359
BLOOD PRODUCT EXPIRATION DATE: 201904102359
BLOOD PRODUCT EXPIRATION DATE: 201904142359
ISSUE DATE / TIME: 201903230647
ISSUE DATE / TIME: 201903231006
UNIT TYPE AND RH: 5100
UNIT TYPE AND RH: 9500
Unit Type and Rh: 5100

## 2017-12-04 LAB — GLUCOSE, CAPILLARY
Glucose-Capillary: 104 mg/dL — ABNORMAL HIGH (ref 65–99)
Glucose-Capillary: 118 mg/dL — ABNORMAL HIGH (ref 65–99)

## 2017-12-04 MED ORDER — PANTOPRAZOLE SODIUM 40 MG PO TBEC: 40.0000 mg | DELAYED_RELEASE_TABLET | Freq: Every day | ORAL | 0 refills | Status: DC

## 2017-12-04 MED ORDER — FERROUS SULFATE 325 (65 FE) MG PO TBEC: 325.0000 mg | DELAYED_RELEASE_TABLET | Freq: Every day | ORAL | 0 refills | Status: DC

## 2017-12-04 NOTE — Progress Notes (Addendum)
Daily Rounding Note  12/04/2017, 9:01 AM  LOS: 2 days   SUBJECTIVE:   Chief complaint: minor rectal bleeding PTA.  Non recurrent.  No stool since bowel prep.  Feels well.  Happy about planned d/c home.  Eating well.        OBJECTIVE:         Vital signs in last 24 hours:    Temp:  [97.8 F (36.6 C)-98.4 F (36.9 C)] 98.4 F (36.9 C) (03/26 0453) Pulse Rate:  [73-81] 78 (03/26 0453) Resp:  [11-18] 16 (03/26 0453) BP: (96-134)/(31-57) 126/49 (03/26 0453) SpO2:  [90 %-96 %] 90 % (03/26 0453) Last BM Date: 12/04/17 Filed Weights   11/30/17 2200 12/01/17 1317 12/03/17 0829  Weight: 162 lb (73.5 kg) 166 lb 6.4 oz (75.5 kg) 166 lb (75.3 kg)   General: pleasant, elderly WF.  Alert and comfortable.  Not ill looking   Heart: RRR Chest: clear bil.  No cough or labored breathing Abdomen: soft, NT, ND.  Active BS  Extremities: no CCE Neuro/Psych:  Oriented x 3.  No gross deficits.     Intake/Output from previous day: 03/25 0701 - 03/26 0700 In: 400 [I.V.:400] Out: -   Intake/Output this shift: No intake/output data recorded.  Lab Results: Recent Labs    12/02/17 0454 12/03/17 0724  WBC 5.4 5.8  HGB 9.9* 10.4*  HCT 31.8* 33.8*  PLT 262 269   BMET Recent Labs    12/02/17 0454  NA 138  K 3.9  CL 106  CO2 21*  GLUCOSE 99  BUN 6  CREATININE 0.65  CALCIUM 8.4*    Scheduled Meds: . amLODipine  10 mg Oral Daily  . atorvastatin  20 mg Oral Daily  . ferrous sulfate  325 mg Oral TID  . insulin aspart  0-5 Units Subcutaneous QHS  . insulin aspart  0-9 Units Subcutaneous TID WC  . levothyroxine  150 mcg Oral QAC breakfast  . pantoprazole (PROTONIX) IV  40 mg Intravenous Q12H  . traZODone  50 mg Oral QHS  . triamcinolone cream  1 application Topical BID   Continuous Infusions: . sodium chloride     PRN Meds:.acetaminophen **OR** acetaminophen, albuterol, ALPRAZolam, bisacodyl, hydrALAZINE, ondansetron  **OR** ondansetron (ZOFRAN) IV, zolpidem   ASSESMENT:   *  Symptomatic anemia despite chronic 3 x weekly >>upgraded to daily po iron.  Limited episode of rectal bleeding.  Previous AVMs on 2014 colonoscopy/EGD.  S/p PRBCs x 2 with excellent response.  3/25 Colonoscopy: 2 polyps removed, 5 transverse) and 15 mm (rectum).  Non-bleeding cecal AVM eradicated with APC.  3/25 EGD.   Non-bleeding AVM in duodenum eradicated with APC.   Larger polyp likely responsible for the rectal bleeding.  AVMs likely cause of IDA, chronic blood loss.   *  Aggrenox therapy, d/cd recently.  Dr Ardis Hughs noted ok to restart Aggrenox if necessary. Small medullary CVA 2004.     PLAN   *  Awaiting path from the 2 polyps.  Even if adenomatous, at her age surveillance colonoscopy will not be indicated.   *  Agree she is safe to discharge home today.  No need of GI fup.  Advised her to return to ED if she develops significant GI bleeding/hematochezia which would indicate post polypectomy bleed.    *  Stop IV Protonix.  Resume Prilosec 20 mg daily.      [name]  12/04/2017, 9:01 AM Pager: [phone]   ________________________________________________________________________  [facility] MD note:  I personally examined the patient, reviewed the data and agree with the assessment and plan described above.  SAfe for d/c home today.  I will contact her with path results from polyps.  She should stay on iron once daily, indefinitely.   Owens Loffler, MD [facility] Pager [phone]

## 2017-12-04 NOTE — Progress Notes (Signed)
Pt given discharge instructions, prescriptions, and care notes. Pt verbalized understanding AEB no further questions or concerns at this time. IV was discontinued, no redness, pain, or swelling noted at this time. Telemetry discontinued and Centralized Telemetry was notified. Pt left the floor via wheelchair with family in stable condition. 

## 2017-12-04 NOTE — Plan of Care (Signed)
Patient continues to progress this shift. Will continue to treat and monitor per MD orders.

## 2017-12-04 NOTE — Discharge Summary (Signed)
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 She remains comfortable.She  has been transfused with 2 units of PRBC. There is no more report of bloody bowel movement. Patient was seen by gastroenterology  .She underwent  EGD/colonoscopy on 12/03/17.  Report as below. Patient seen and examined the bedside this morning.  She is comfortable.  She is eating her food. She is stable for discharge to home today.    Following problems were addressed during her hospitalization:  Anemia: Transfused with 2 units of PRBC.  Currently  H&H is stable. Patient is hemodynamically stable  Lower GI bleed: GI was following.  History of AVMs as per the EGD/colonoscopy done on 02/24/2013.   Underwent  EGD/colonoscopy.  Colonoscopy showed  polyps in transverse colon, rectum. EGD showed : 1 small duodenal bulb AVM.  Status post APC application. Diet has been advanced and she is tolerating.  GI is okay to resume her Aggrenox. Continue iron supplementation.  Check CBC as an outpatient in 3-4 weeks.  Hypertension: Currently blood pressure stable.  Continue amlodipine.  Hyperlipidemia: Continue Lipitor  Hypothyroidism: TSH normal Continue home Synthroid  Hypokalemia: Supplemented and corrected  Iron deficiency anemia: Continue iron supplement  Anxiety: On prn  xanax  Diabetes mellitus without complication :Last M4Q6.8 on 07/10/17, well controled. Patient isnottakingmedsat home.  Continue sliding scale insulin  Hyponatremia: Much improved.Hyponatremia on admission ,etiology is not clear, likely multifactorial etiology, including decreased oral intake and Benicar-HCTZ use. Mental status normal.  Hx of stroke: Has sequela of mild left-sided weakness.Continue  lipitor.Patient was on Aggrenox at home .Can be resumed after discussion with her PCP.   Discharge Diagnoses:  Principal Problem:   Symptomatic anemia Active Problems:   Iron deficiency anemia, unspecified   Hyperlipidemia   Anxiety   Stroke ([place])   Hypothyroidism   Diabetes mellitus without complication (HCC)   GIB (gastrointestinal bleeding)   Hyponatremia   Hypokalemia   HTN (hypertension)   Lower GI bleed   Polyp of rectum   AVM (arteriovenous malformation) of colon   AVM (arteriovenous malformation) of duodenum, acquired    Discharge Instructions  Discharge Instructions    Diet - low sodium heart healthy   Complete by:  As directed    Discharge instructions   Complete by:  As directed    1) Follow up with your PCP within a week. 2) Do a CBC test to check your hemoglobin in 3-4 weeks. 3)You can resume taking  aggrenox after discussing with your PCP. 4)Take prescribed medications as instructed   Increase activity slowly   Complete by:  As directed      Allergies as of 12/04/2017      Reactions   Prednisone    Climbs the walls      Medication List    STOP taking these medications   omeprazole 20 MG capsule Commonly known as:  PRILOSEC     TAKE these medications   albuterol 108 (90 Base) MCG/ACT inhaler Commonly known as:  PROVENTIL  HFA;VENTOLIN HFA Inhale 1-2 puffs into the lungs every 6 (six) hours as needed for wheezing or shortness of breath.   ALPRAZolam 0.5 MG tablet Commonly known as:  XANAX Take 0.5 mg by mouth 3 (three) times daily as  needed. For anxiety   amLODipine 2.5 MG tablet Commonly known as:  NORVASC Take 10 mg by mouth daily.   atorvastatin 20 MG tablet Commonly known as:  LIPITOR Take 20 mg by mouth daily.   betamethasone dipropionate 0.05 % cream Commonly known as:  DIPROLENE Apply 1 application topically 2 (two) times daily.   ferrous sulfate 325 (65 FE) MG EC tablet Take 1 tablet (325 mg total) by mouth daily with breakfast. What changed:  when to take this   levothyroxine 150 MCG tablet Commonly known as:  SYNTHROID, LEVOTHROID Take 150 mcg by mouth daily.   olmesartan-hydrochlorothiazide 40-25 MG tablet Commonly known as:  BENICAR HCT Take 1 tablet by mouth daily.   pantoprazole 40 MG tablet Commonly known as:  PROTONIX Take 1 tablet (40 mg total) by mouth daily.   potassium chloride SA 20 MEQ tablet Commonly known as:  K-DUR,KLOR-CON Take 20 mEq by mouth daily.   traZODone 50 MG tablet Commonly known as:  DESYREL Take 50 mg by mouth at bedtime.      Follow-up Information    Shon Baton, MD. Schedule an appointment as soon as possible for a visit in 1 week(s).   Specialty:  Internal Medicine Contact information: [place] [id number] [phone]          Allergies  Allergen Reactions  . Prednisone     Climbs the walls    Consultations: Gastroenterology  Procedures/Studies:  No results found. EGD/colonoscopy   Subjective: Patient seen and examined at bedside this morning.  Remains comfortable.  Was eating her breakfast.  She is stable for discharge to home today.  Discharge Exam: Vitals:   12/03/17 2101 12/04/17 0453  BP: (!) 134/46 (!) 126/49  Pulse: 73 78  Resp: 18 16  Temp: 98.2 F (36.8 C) 98.4 F (36.9 C)  SpO2: 95% 90%   Vitals:   12/03/17 1018 12/03/17 1400 12/03/17 2101 12/04/17 0453  BP: (!) 134/57 (!) 128/42 (!) 134/46 (!) 126/49  Pulse: 81 76 73 78  Resp: 16  18 16   Temp:  97.8 F (36.6 C) 98.2 F (36.8 C) 98.4 F  (36.9 C)  TempSrc:  Oral    SpO2: 96% 92% 95% 90%  Weight:      Height:        General: Pt is alert, awake, not in acute distress, elderly female Cardiovascular: RRR, S1/S2 +, no rubs, no gallops Respiratory: CTA bilaterally, no wheezing, no rhonchi Abdominal: Soft, NT, ND, bowel sounds + Extremities: no edema, no cyanosis    The results of significant diagnostics from this hospitalization (including imaging, microbiology, ancillary and laboratory) are listed below for reference.     Microbiology: No results found for this or any previous visit (from the past 240 hour(s)).   Labs: BNP (last 3 results) Recent Labs    12/01/17 0618  BNP 78.2   Basic Metabolic Panel: Recent Labs  Lab 11/30/17 2229 12/02/17 0454  NA 127* 138  K 3.4* 3.9  CL 94* 106  CO2 22 21*  GLUCOSE 218* 99  BUN 12 6  CREATININE 0.80 0.65  CALCIUM 8.6* 8.4*   Liver Function Tests: Recent Labs  Lab 11/30/17 2229  AST 22  ALT 16  ALKPHOS  81  BILITOT 0.3  PROT 6.0*  ALBUMIN 3.7   No results for input(s): LIPASE, AMYLASE in the last 168 hours. No results for input(s): AMMONIA in the last 168 hours. CBC: Recent Labs  Lab 11/30/17 2229 12/01/17 0340 12/01/17 0618 12/02/17 0454 12/03/17 0724  WBC 7.0 7.4 8.1 5.4 5.8  NEUTROABS  --   --   --  3.5 4.1  HGB 8.3* 7.7* 8.3* 9.9* 10.4*  HCT 27.1* 25.7* 26.2* 31.8* 33.8*  MCV 92.8 92.4 92.9 92.7 92.9  PLT 320 264 232 262 269   Cardiac Enzymes: No results for input(s): CKTOTAL, CKMB, CKMBINDEX, TROPONINI in the last 168 hours. BNP: Invalid input(s): POCBNP CBG: Recent Labs  Lab 12/03/17 0841 12/03/17 1201 12/03/17 1632 12/03/17 2059 12/04/17 0803  GLUCAP 106* 115* 132* 113* 104*   D-Dimer No results for input(s): DDIMER in the last 72 hours. Hgb A1c No results for input(s): HGBA1C in the last 72 hours. Lipid Profile No results for input(s): CHOL, HDL, LDLCALC, TRIG, CHOLHDL, LDLDIRECT in the last 72 hours. Thyroid function  studies Recent Labs    12/02/17 0454  TSH 0.673   Anemia work up No results for input(s): VITAMINB12, FOLATE, FERRITIN, TIBC, IRON, RETICCTPCT in the last 72 hours. Urinalysis    Component Value Date/Time   COLORURINE YELLOW 10/05/2008 1806   APPEARANCEUR CLEAR 10/05/2008 1806   LABSPEC 1.008 10/05/2008 1806   PHURINE 6.5 10/05/2008 1806   GLUCOSEU NEGATIVE 10/05/2008 1806   HGBUR NEGATIVE 10/05/2008 1806   BILIRUBINUR NEGATIVE 10/05/2008 1806   KETONESUR NEGATIVE 10/05/2008 1806   PROTEINUR NEGATIVE 10/05/2008 1806   UROBILINOGEN 0.2 10/05/2008 1806   NITRITE NEGATIVE 10/05/2008 1806   LEUKOCYTESUR MODERATE (A) 10/05/2008 1806   Sepsis Labs Invalid input(s): PROCALCITONIN,  WBC,  LACTICIDVEN Microbiology No results found for this or any previous visit (from the past 240 hour(s)).   Time coordinating discharge: Over 30 minutes  SIGNED:   Shelly Coss, MD  [facility] 12/04/2017, 9:07 AM Pager [phone]  If 7PM-7AM, please contact night-coverage [email or web address] Password [email or web address]

## 2017-12-04 NOTE — Care Management Note (Signed)
Case Management Note  Patient Details  Name: [name] MRN: [id number] Date of Birth: September 15, 1928  Subjective/Objective:     Symptomatic anemia. Resides with son, granddaughter and uncle. States independent with ADL's , no DME needs.         Biagio Borg (Son)     [phone]      PCP: Shon Baton  Action/Plan: Transition to home today. No needs identified per NCM. Pt state son to provide transportation to home.   Expected Discharge Date:  12/04/17               Expected Discharge Plan:  Home/Self Care  In-House Referral:     Discharge planning Services  CM Consult  Post Acute Care Choice:   N/A Choice offered to:   N/A  DME Arranged:   N/A DME Agency:   N/A  HH Arranged:   N/A HH Agency:   N/A  Status of Service:  Completed, signed off  If discussed at [place] of Stay Meetings, dates discussed:    Additional Comments:  Sharin Mons, RN 12/04/2017, 10:41 AM

## 2017-12-05 ENCOUNTER — Inpatient Hospital Stay (HOSPITAL_COMMUNITY): Admission: RE | Admit: 2017-12-05 | Payer: Medicare Other | Source: Ambulatory Visit

## 2017-12-05 ENCOUNTER — Encounter (HOSPITAL_COMMUNITY): Payer: Self-pay

## 2017-12-05 ENCOUNTER — Emergency Department (HOSPITAL_COMMUNITY): Payer: Medicare Other

## 2017-12-05 ENCOUNTER — Emergency Department (HOSPITAL_COMMUNITY)
Admission: EM | Admit: 2017-12-05 | Discharge: 2017-12-05 | Disposition: A | Discharge status: Home / Self Care | Payer: Medicare Other | Attending: Emergency Medicine | Admitting: Emergency Medicine

## 2017-12-05 DIAGNOSIS — R05 Cough: Secondary | ICD-10-CM | POA: Insufficient documentation

## 2017-12-05 DIAGNOSIS — Z8673 Personal history of transient ischemic attack (TIA), and cerebral infarction without residual deficits: Secondary | ICD-10-CM | POA: Diagnosis not present

## 2017-12-05 DIAGNOSIS — R062 Wheezing: Secondary | ICD-10-CM | POA: Diagnosis not present

## 2017-12-05 DIAGNOSIS — R0602 Shortness of breath: Secondary | ICD-10-CM

## 2017-12-05 DIAGNOSIS — Z79899 Other long term (current) drug therapy: Secondary | ICD-10-CM | POA: Diagnosis not present

## 2017-12-05 DIAGNOSIS — R0600 Dyspnea, unspecified: Secondary | ICD-10-CM | POA: Diagnosis not present

## 2017-12-05 DIAGNOSIS — E039 Hypothyroidism, unspecified: Secondary | ICD-10-CM | POA: Diagnosis not present

## 2017-12-05 DIAGNOSIS — I10 Essential (primary) hypertension: Secondary | ICD-10-CM | POA: Diagnosis not present

## 2017-12-05 DIAGNOSIS — E119 Type 2 diabetes mellitus without complications: Secondary | ICD-10-CM | POA: Diagnosis not present

## 2017-12-05 DIAGNOSIS — Z87891 Personal history of nicotine dependence: Secondary | ICD-10-CM | POA: Diagnosis not present

## 2017-12-05 LAB — COMPREHENSIVE METABOLIC PANEL
ALK PHOS: 62 U/L (ref 38–126)
ALT: 15 U/L (ref 14–54)
ANION GAP: 10 (ref 5–15)
AST: 27 U/L (ref 15–41)
Albumin: 3.5 g/dL (ref 3.5–5.0)
BUN: 10 mg/dL (ref 6–20)
CALCIUM: 8.6 mg/dL — AB (ref 8.9–10.3)
CO2: 23 mmol/L (ref 22–32)
CREATININE: 0.66 mg/dL (ref 0.44–1.00)
Chloride: 105 mmol/L (ref 101–111)
GFR calc Af Amer: >60 mL/min (ref >60)
GFR calc non Af Amer: >60 mL/min (ref >60)
Glucose, Bld: 195 mg/dL — ABNORMAL HIGH (ref 65–99)
Potassium: 3.1 mmol/L — ABNORMAL LOW (ref 3.5–5.1)
SODIUM: 138 mmol/L (ref 135–145)
Total Bilirubin: 0.6 mg/dL (ref 0.3–1.2)
Total Protein: 6 g/dL — ABNORMAL LOW (ref 6.5–8.1)

## 2017-12-05 LAB — CBC WITH DIFFERENTIAL/PLATELET
Basophils Absolute: 0 10*3/uL (ref 0.0–0.1)
Basophils Relative: 0 %
EOS ABS: 0.1 10*3/uL (ref 0.0–0.7)
Eosinophils Relative: 1 %
HEMATOCRIT: 32.4 % — AB (ref 36.0–46.0)
HEMOGLOBIN: 10.2 g/dL — AB (ref 12.0–15.0)
LYMPHS ABS: 0.8 10*3/uL (ref 0.7–4.0)
Lymphocytes Relative: 8 %
MCH: 29.3 pg (ref 26.0–34.0)
MCHC: 31.5 g/dL (ref 30.0–36.0)
MCV: 93.1 fL (ref 78.0–100.0)
MONOS PCT: 3 %
Monocytes Absolute: 0.3 10*3/uL (ref 0.1–1.0)
NEUTROS PCT: 88 %
Neutro Abs: 8.9 10*3/uL — ABNORMAL HIGH (ref 1.7–7.7)
PLATELETS: 248 10*3/uL (ref 150–400)
RBC: 3.48 MIL/uL — ABNORMAL LOW (ref 3.87–5.11)
RDW: 16.4 % — AB (ref 11.5–15.5)
WBC: 10.1 10*3/uL (ref 4.0–10.5)

## 2017-12-05 LAB — I-STAT TROPONIN, ED: Troponin i, poc: 0.01 ng/mL (ref 0.00–0.08)

## 2017-12-05 LAB — BRAIN NATRIURETIC PEPTIDE: B Natriuretic Peptide: 241.5 pg/mL — ABNORMAL HIGH (ref 0.0–100.0)

## 2017-12-05 MED ORDER — IOPAMIDOL (ISOVUE-370) INJECTION 76%
INTRAVENOUS | Status: AC
  Filled 2017-12-05: qty 100

## 2017-12-05 MED ORDER — ALBUTEROL (5 MG/ML) CONTINUOUS INHALATION SOLN
10.0000 mg/h | INHALATION_SOLUTION | Freq: Once | RESPIRATORY_TRACT | Status: AC
  Administered 2017-12-05: 10 mg/h via RESPIRATORY_TRACT
  Filled 2017-12-05: qty 20

## 2017-12-05 MED ORDER — ALBUTEROL SULFATE HFA 108 (90 BASE) MCG/ACT IN AERS: 1.0000 | INHALATION_SPRAY | Freq: Four times a day (QID) | RESPIRATORY_TRACT | 0 refills | Status: DC | PRN

## 2017-12-05 MED ORDER — ALBUTEROL SULFATE (2.5 MG/3ML) 0.083% IN NEBU
5.0000 mg | INHALATION_SOLUTION | Freq: Once | RESPIRATORY_TRACT | Status: AC
  Administered 2017-12-05: 5 mg via RESPIRATORY_TRACT
  Filled 2017-12-05: qty 6

## 2017-12-05 MED ORDER — POTASSIUM CHLORIDE CRYS ER 20 MEQ PO TBCR
40.0000 meq | EXTENDED_RELEASE_TABLET | Freq: Once | ORAL | Status: AC
  Administered 2017-12-05: 40 meq via ORAL
  Filled 2017-12-05: qty 2

## 2017-12-05 MED ORDER — IOPAMIDOL (ISOVUE-370) INJECTION 76%
100.0000 mL | Freq: Once | INTRAVENOUS | Status: AC | PRN
  Administered 2017-12-05: 68 mL via INTRAVENOUS

## 2017-12-05 NOTE — ED Triage Notes (Signed)
Pt arrived via [facility] due to shortness of breath. Pt had a blood transfusion yesterday and woke up short of breath. EMS states she had an O2 sat at 92% RA, placed on 3L. Given 5mg  albuterol and 125 solumedrol en route.

## 2017-12-05 NOTE — ED Notes (Signed)
Patient transported to CT 

## 2017-12-05 NOTE — ED Notes (Signed)
Patient's RA O2 sat before D/C 98%. Patient tolerated no O2 well.

## 2017-12-05 NOTE — ED Provider Notes (Signed)
[facility] DEPT Provider Note   CSN: [id number] Arrival date & time: 12/05/17  0248  Time seen 03:00 AM   History   Chief Complaint Chief Complaint  Patient presents with  . Shortness of Breath    HPI [name] is a 84 y.o. female.  HPI she was admitted to the hospital from March 22 and was discharged on March 26.  She was admitted for a GI bleed and had endoscopy and colonoscopy which showed a polyp and 2 AVMs were cauterized.  She did receive 2 units of blood.  She states the blood transfusions were on the 23rd.  She states she woke up at 1:30 AM and felt very short of breath and was having wheezing with a dry cough.  She denies fever.  She states she was placed on an albuterol inhaler about a month ago for bronchitis and then she has been on it one other time several years ago with bronchitis.  She does not use oxygen at home and does not have a home nebulizer.  She states she used her inhaler however it did not seem to help her breathing.  She states she had some rhinorrhea tonight but denies sore throat.  She denies being told she has COPD.  She states she used to smoke many years ago.  EMS was called and she was given an albuterol nebulizer treatment and Solu-Medrol 125 mg IM which she states has made her feel better.  PCP Shon Baton, MD   Past Medical History:  Diagnosis Date  . Anemia   . Anxiety   . Colon polyps   . DDD (degenerative disc disease), cervical   . Diabetes mellitus   . Diabetes mellitus, type 2 ([place])   . Hyperlipidemia   . Hypertension   . Hypothyroidism   . Leukemia ([place]) in remission  . Mucous retention cyst of maxillary sinus   . Osteoporosis   . Pinched nerve In neck  . Plantar fasciitis   . Stroke ([place])   . Vitamin D deficiency     Patient Active Problem List   Diagnosis Date Noted  . Polyp of rectum   . AVM (arteriovenous malformation) of colon   . AVM (arteriovenous malformation) of duodenum, acquired     . Lower GI bleed 12/02/2017  . GIB (gastrointestinal bleeding) 12/01/2017  . Hyponatremia 12/01/2017  . Hypokalemia 12/01/2017  . HTN (hypertension) 12/01/2017  . Diabetes mellitus without complication ([place]) 37/06/6268  . Acute pancreatitis 07/12/2017  . Elevated transaminase level 07/12/2017  . Chest pain 07/10/2017  . Hyperlipidemia   . Anxiety   . Stroke ([place])   . Hypothyroidism   . Iron deficiency anemia, unspecified 02/24/2013  . Nonspecific abnormal finding in stool contents 02/24/2013  . Personal history of colonic polyps 02/24/2013  . Symptomatic anemia     Past Surgical History:  Procedure Laterality Date  . APPENDECTOMY    . CHOLECYSTECTOMY     2004  . COLONOSCOPY N/A 02/24/2013   Procedure: COLONOSCOPY;  Surgeon: Ladene Artist, MD;  Location: [facility];  Service: Endoscopy;  Laterality: N/A;  . COLONOSCOPY W/ POLYPECTOMY    . COLONOSCOPY WITH PROPOFOL N/A 12/03/2017   Procedure: COLONOSCOPY WITH PROPOFOL;  Surgeon: [name], MD;  Location: [facility];  Service: Gastroenterology;  Laterality: N/A;  . ESOPHAGOGASTRODUODENOSCOPY    . ESOPHAGOGASTRODUODENOSCOPY N/A 02/24/2013   Procedure: ESOPHAGOGASTRODUODENOSCOPY (EGD);  Surgeon: Ladene Artist, MD;  Location: [facility];  Service: Endoscopy;  Laterality:  N/A;  . ESOPHAGOGASTRODUODENOSCOPY (EGD) WITH PROPOFOL N/A 12/03/2017   Procedure: ESOPHAGOGASTRODUODENOSCOPY (EGD) WITH PROPOFOL;  Surgeon: [name], MD;  Location: [facility];  Service: Gastroenterology;  Laterality: N/A;  . EYE SURGERY     bilateral catracts     OB History   None      Home Medications    Prior to Admission medications   Medication Sig Start Date End Date Taking? Authorizing Provider  albuterol (PROVENTIL HFA;VENTOLIN HFA) 108 (90 Base) MCG/ACT inhaler Inhale 1-2 puffs into the lungs every 6 (six) hours as needed for wheezing or shortness of breath.   Yes [provider]  ALPRAZolam Duanne Moron) 0.5 MG tablet  Take 0.5 mg by mouth 3 (three) times daily as needed. For anxiety   Yes [provider]  amLODipine (NORVASC) 2.5 MG tablet Take 10 mg by mouth daily.    Yes [provider]  atorvastatin (LIPITOR) 20 MG tablet Take 20 mg by mouth daily.   Yes [provider]  betamethasone dipropionate (DIPROLENE) 0.05 % cream Apply 1 application topically 2 (two) times daily.   Yes [provider]  levothyroxine (SYNTHROID, LEVOTHROID) 150 MCG tablet Take 150 mcg by mouth daily.   Yes [provider]  olmesartan-hydrochlorothiazide (BENICAR HCT) 40-25 MG tablet Take 1 tablet by mouth daily. 10/15/17  Yes [provider]  potassium chloride SA (K-DUR,KLOR-CON) 20 MEQ tablet Take 20 mEq by mouth daily.   Yes [provider]  traZODone (DESYREL) 50 MG tablet Take 50 mg by mouth at bedtime. 11/20/17  Yes [provider]  ferrous sulfate 325 (65 FE) MG EC tablet Take 1 tablet (325 mg total) by mouth daily with breakfast. 12/04/17   Shelly Coss, MD  pantoprazole (PROTONIX) 40 MG tablet Take 1 tablet (40 mg total) by mouth daily. 12/04/17 01/03/18  Shelly Coss, MD    Family History Family History  Problem Relation Age of Onset  . Diabetes Unknown     Social History Social History   Tobacco Use  . Smoking status: Former Research scientist (life sciences)  . Smokeless tobacco: Never Used  Substance Use Topics  . Alcohol use: No  . Drug use: No  lives at home Lives with family   Allergies   Prednisone   Review of Systems Review of Systems  All other systems reviewed and are negative.    Physical Exam Updated Vital Signs BP (!) 131/45   Pulse 97   Temp (!) 97.3 F (36.3 C) (Oral)   Resp 16   Ht 5' (1.524 m)   Wt 75.3 kg (166 lb)   SpO2 99%   BMI 32.42 kg/m   Vital signs normal    Physical Exam  Constitutional: She is oriented to person, place, and time. She appears well-developed and well-nourished.  Non-toxic appearance. She does not  appear ill. No distress.  HENT:  Head: Normocephalic and atraumatic.  Right Ear: External ear normal.  Left Ear: External ear normal.  Nose: Nose normal. No mucosal edema or rhinorrhea.  Mouth/Throat: Oropharynx is clear and moist and mucous membranes are normal. No dental abscesses or uvula swelling.  Eyes: Pupils are equal, round, and reactive to light. Conjunctivae and EOM are normal.  Neck: Normal range of motion and full passive range of motion without pain. Neck supple.  Cardiovascular: Normal rate, regular rhythm and normal heart sounds. Exam reveals no gallop and no friction rub.  No murmur heard. Pulmonary/Chest: Effort normal. No respiratory distress. She has decreased breath sounds. She has  wheezes. She has no rhonchi. She has no rales. She exhibits no tenderness and no crepitus.  Patient is noted to have oral wheezing at times  Abdominal: Soft. Normal appearance and bowel sounds are normal. She exhibits no distension. There is no tenderness. There is no rebound and no guarding.  Musculoskeletal: Normal range of motion. She exhibits no edema or tenderness.  Moves all extremities well.   Neurological: She is alert and oriented to person, place, and time. She has normal strength. No cranial nerve deficit.  Skin: Skin is warm, dry and intact. No rash noted. No erythema. No pallor.  Psychiatric: She has a normal mood and affect. Her speech is normal and behavior is normal. Her mood appears not anxious.  Nursing note and vitals reviewed.    ED Treatments / Results  Labs (all labs ordered are listed, but only abnormal results are displayed) Results for orders placed or performed during the hospital encounter of 12/05/17  Comprehensive metabolic panel  Result Value Ref Range   Sodium 138 135 - 145 mmol/L   Potassium 3.1 (L) 3.5 - 5.1 mmol/L   Chloride 105 101 - 111 mmol/L   CO2 23 22 - 32 mmol/L   Glucose, Bld 195 (H) 65 - 99 mg/dL   BUN 10 6 - 20 mg/dL   Creatinine, Ser 0.66  0.44 - 1.00 mg/dL   Calcium 8.6 (L) 8.9 - 10.3 mg/dL   Total Protein 6.0 (L) 6.5 - 8.1 g/dL   Albumin 3.5 3.5 - 5.0 g/dL   AST 27 15 - 41 U/L   ALT 15 14 - 54 U/L   Alkaline Phosphatase 62 38 - 126 U/L   Total Bilirubin 0.6 0.3 - 1.2 mg/dL   GFR calc non Af Amer >60 >60 mL/min   GFR calc Af Amer >60 >60 mL/min   Anion gap 10 5 - 15  Brain natriuretic peptide  Result Value Ref Range   B Natriuretic Peptide 241.5 (H) 0.0 - 100.0 pg/mL  CBC with Differential  Result Value Ref Range   WBC 10.1 4.0 - 10.5 K/uL   RBC 3.48 (L) 3.87 - 5.11 MIL/uL   Hemoglobin 10.2 (L) 12.0 - 15.0 g/dL   HCT 32.4 (L) 36.0 - 46.0 %   MCV 93.1 78.0 - 100.0 fL   MCH 29.3 26.0 - 34.0 pg   MCHC 31.5 30.0 - 36.0 g/dL   RDW 16.4 (H) 11.5 - 15.5 %   Platelets 248 150 - 400 K/uL   Neutrophils Relative % 88 %   Neutro Abs 8.9 (H) 1.7 - 7.7 K/uL   Lymphocytes Relative 8 %   Lymphs Abs 0.8 0.7 - 4.0 K/uL   Monocytes Relative 3 %   Monocytes Absolute 0.3 0.1 - 1.0 K/uL   Eosinophils Relative 1 %   Eosinophils Absolute 0.1 0.0 - 0.7 K/uL   Basophils Relative 0 %   Basophils Absolute 0.0 0.0 - 0.1 K/uL  I-stat troponin, ED  Result Value Ref Range   Troponin i, poc 0.01 0.00 - 0.08 ng/mL   Comment 3           ,Laboratory interpretation all normal except mild anemia, minimally elevated BNP, hypokalemia    EKG EKG Interpretation  Date/Time:  Wednesday December 05 2017 03:02:50 EDT Ventricular Rate:  103 PR Interval:    QRS Duration: 82 QT Interval:  333 QTC Calculation: 436 R Axis:   49 Text Interpretation:  Sinus tachycardia Ventricular premature complex Borderline T abnormalities, lateral leads  Since last tracing rate faster 01 Dec 2017 Confirmed by Rolland Porter [phone]) on 12/05/2017 4:05:03 AM   Radiology Dg Chest Port 1 View  Result Date: 12/05/2017 CLINICAL DATA:  84 year old female with shortness of breath and wheezing. EXAM: PORTABLE CHEST 1 VIEW COMPARISON:  Chest radiograph dated 07/09/2017  FINDINGS: Mild diffuse interstitial coarsening. No focal consolidation, pleural effusion, or pneumothorax. The cardiac silhouette is within normal limits. Atherosclerotic calcification of the aortic arch. No acute osseous pathology. IMPRESSION: No active disease. Electronically Signed   By: Anner Crete M.D.   On: 12/05/2017 04:23    Procedures .Critical Care Performed by: Rolland Porter, MD Authorized by: Rolland Porter, MD   Critical care provider statement:    Critical care time (minutes):  33   Critical care was necessary to treat or prevent imminent or life-threatening deterioration of the following conditions:  Respiratory failure   Critical care was time spent personally by me on the following activities:  Examination of patient, obtaining history from patient or surrogate, pulse oximetry, re-evaluation of patient's condition, ordering and review of radiographic studies, ordering and review of laboratory studies and ordering and performing treatments and interventions   (including critical care time)  Medications Ordered in ED Medications  albuterol (PROVENTIL) (2.5 MG/3ML) 0.083% nebulizer solution 5 mg (5 mg Nebulization Given 12/05/17 0351)  potassium chloride SA (K-DUR,KLOR-CON) CR tablet 40 mEq (40 mEq Oral Given 12/05/17 0603)  albuterol (PROVENTIL,VENTOLIN) solution continuous neb (10 mg/hr Nebulization Given 12/05/17 5462)     Initial Impression / Assessment and Plan / ED Course  I have reviewed the triage vital signs and the nursing notes.  Pertinent labs & imaging results that were available during my care of the patient were reviewed by me and considered in my medical decision making (see chart for details).     She was given another albuterol treatment 5 mg.  Laboratory testing, chest x-ray, EKG was ordered.  After reviewing her laboratory results she was given oral potassium for hypokalemia.  Recheck at 5:30 AM patient states she still feels short of breath.  When I  listen to her lungs she still has some late end expiratory wheezing.  She was given a continuous nebulizer.  Recheck at 7:20 AM patient has almost finished her continuous nebulizer of albuterol.  Her heart rate is 100 up to 106.  She states she can feel her heart beating fast.  She states she still feels short of breath.  When I listen to her however she has much improved air movement and she is no longer having wheezing.  Family states she is not been very active for the past month, patient is at risk for having a PE.  CTA was ordered.  08:35 AM Dr Venora Maples will check her CTA results.   Final Clinical Impressions(s) / ED Diagnoses   Final diagnoses:  Shortness of breath    Disposition pending  Rolland Porter, MD, Barbette Or, MD 12/05/17 [phone]

## 2017-12-05 NOTE — ED Notes (Signed)
CT MADE ATTEMPT FOR IV HOWEVER NOT SUCCESSFUL. PT NEED IV FOR CT

## 2017-12-05 NOTE — ED Provider Notes (Signed)
Patient is overall well-appearing.  She feels much better at this time.  May have represented bronchitis with bronchospasm as she responded to bronchodilators.  Patient be discharged home with albuterol.  She would benefit from steroids but the patient reports that it makes her "crazy".  We will avoid these at this time.  Close primary care follow-up.  I think she will benefit from pulmonary follow-up as well.   Jola Schmidt, MD 12/05/17 1058

## 2017-12-05 NOTE — ED Notes (Signed)
Bed: AF79 Expected date:  Expected time:  Means of arrival:  Comments: Short of breath

## 2017-12-11 DIAGNOSIS — D509 Iron deficiency anemia, unspecified: Secondary | ICD-10-CM | POA: Diagnosis not present

## 2017-12-12 ENCOUNTER — Encounter (HOSPITAL_COMMUNITY): Payer: Medicare Other

## 2018-01-01 DIAGNOSIS — K922 Gastrointestinal hemorrhage, unspecified: Secondary | ICD-10-CM | POA: Diagnosis not present

## 2018-01-03 DIAGNOSIS — K922 Gastrointestinal hemorrhage, unspecified: Secondary | ICD-10-CM | POA: Diagnosis not present

## 2018-01-03 DIAGNOSIS — E7849 Other hyperlipidemia: Secondary | ICD-10-CM | POA: Diagnosis not present

## 2018-01-03 DIAGNOSIS — E119 Type 2 diabetes mellitus without complications: Secondary | ICD-10-CM | POA: Diagnosis not present

## 2018-01-03 DIAGNOSIS — I10 Essential (primary) hypertension: Secondary | ICD-10-CM | POA: Diagnosis not present

## 2018-01-03 DIAGNOSIS — E876 Hypokalemia: Secondary | ICD-10-CM | POA: Diagnosis not present

## 2018-01-03 DIAGNOSIS — E038 Other specified hypothyroidism: Secondary | ICD-10-CM | POA: Diagnosis not present

## 2018-01-03 DIAGNOSIS — I6990 Unspecified sequelae of unspecified cerebrovascular disease: Secondary | ICD-10-CM | POA: Diagnosis not present

## 2018-01-03 DIAGNOSIS — F418 Other specified anxiety disorders: Secondary | ICD-10-CM | POA: Diagnosis not present

## 2018-01-03 DIAGNOSIS — D509 Iron deficiency anemia, unspecified: Secondary | ICD-10-CM | POA: Diagnosis not present

## 2018-01-03 DIAGNOSIS — Z6831 Body mass index (BMI) 31.0-31.9, adult: Secondary | ICD-10-CM | POA: Diagnosis not present

## 2018-01-15 DIAGNOSIS — D509 Iron deficiency anemia, unspecified: Secondary | ICD-10-CM | POA: Diagnosis not present

## 2018-01-22 ENCOUNTER — Encounter (HOSPITAL_COMMUNITY)
Admission: RE | Admit: 2018-01-22 | Discharge: 2018-01-22 | Disposition: A | Discharge status: Home / Self Care | Payer: Medicare Other | Source: Ambulatory Visit | Attending: Internal Medicine | Admitting: Internal Medicine

## 2018-01-22 DIAGNOSIS — D649 Anemia, unspecified: Secondary | ICD-10-CM | POA: Diagnosis not present

## 2018-01-22 MED ORDER — FERUMOXYTOL INJECTION 510 MG/17 ML
510.0000 mg | INTRAVENOUS | Status: DC
  Administered 2018-01-22: 510 mg via INTRAVENOUS
  Filled 2018-01-22: qty 17

## 2018-01-22 NOTE — Discharge Instructions (Signed)

## 2018-01-25 DIAGNOSIS — D509 Iron deficiency anemia, unspecified: Secondary | ICD-10-CM | POA: Diagnosis not present

## 2018-01-29 ENCOUNTER — Ambulatory Visit (HOSPITAL_COMMUNITY)
Admission: RE | Admit: 2018-01-29 | Discharge: 2018-01-29 | Disposition: A | Discharge status: Home / Self Care | Payer: Medicare Other | Source: Ambulatory Visit | Attending: Internal Medicine | Admitting: Internal Medicine

## 2018-01-29 DIAGNOSIS — D649 Anemia, unspecified: Secondary | ICD-10-CM | POA: Insufficient documentation

## 2018-01-29 MED ORDER — SODIUM CHLORIDE 0.9 % IV SOLN
510.0000 mg | INTRAVENOUS | Status: AC
  Administered 2018-01-29: 12:00:00 510 mg via INTRAVENOUS
  Filled 2018-01-29: qty 17

## 2018-02-01 DIAGNOSIS — D509 Iron deficiency anemia, unspecified: Secondary | ICD-10-CM | POA: Diagnosis not present

## 2018-02-22 DIAGNOSIS — D508 Other iron deficiency anemias: Secondary | ICD-10-CM | POA: Diagnosis not present

## 2018-04-05 DIAGNOSIS — D649 Anemia, unspecified: Secondary | ICD-10-CM | POA: Diagnosis not present

## 2018-04-05 DIAGNOSIS — D508 Other iron deficiency anemias: Secondary | ICD-10-CM | POA: Diagnosis not present

## 2018-05-06 DIAGNOSIS — M810 Age-related osteoporosis without current pathological fracture: Secondary | ICD-10-CM | POA: Diagnosis not present

## 2018-08-06 DIAGNOSIS — K922 Gastrointestinal hemorrhage, unspecified: Secondary | ICD-10-CM | POA: Diagnosis not present

## 2018-11-04 IMAGING — DX DG CHEST 2V
2 series · 2 of 2 positions shown · non-contrast
Comparison: 09/27/2011

CLINICAL DATA: Central chest pain

EXAM:
CHEST  2 VIEW

[chest lat]
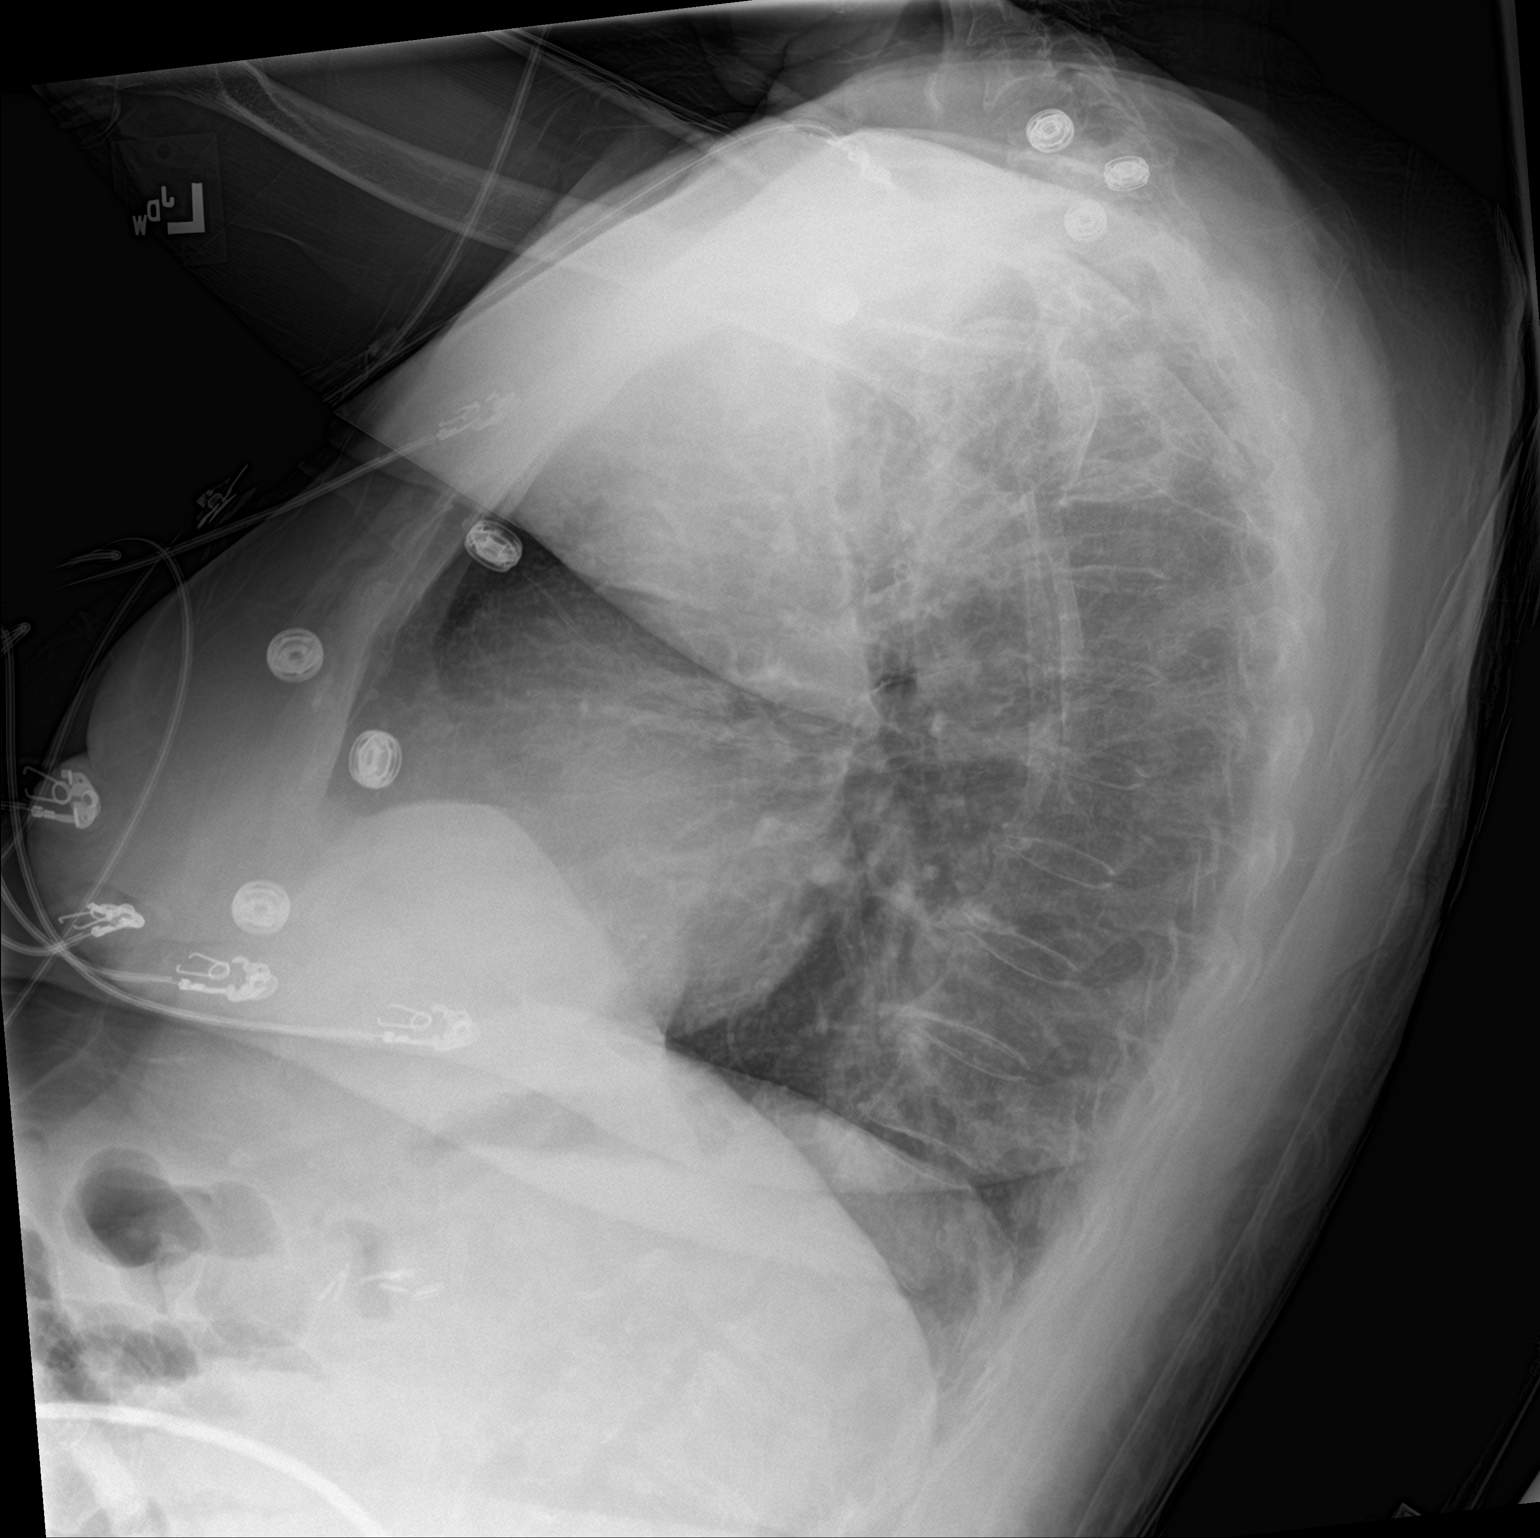

[chest ap]
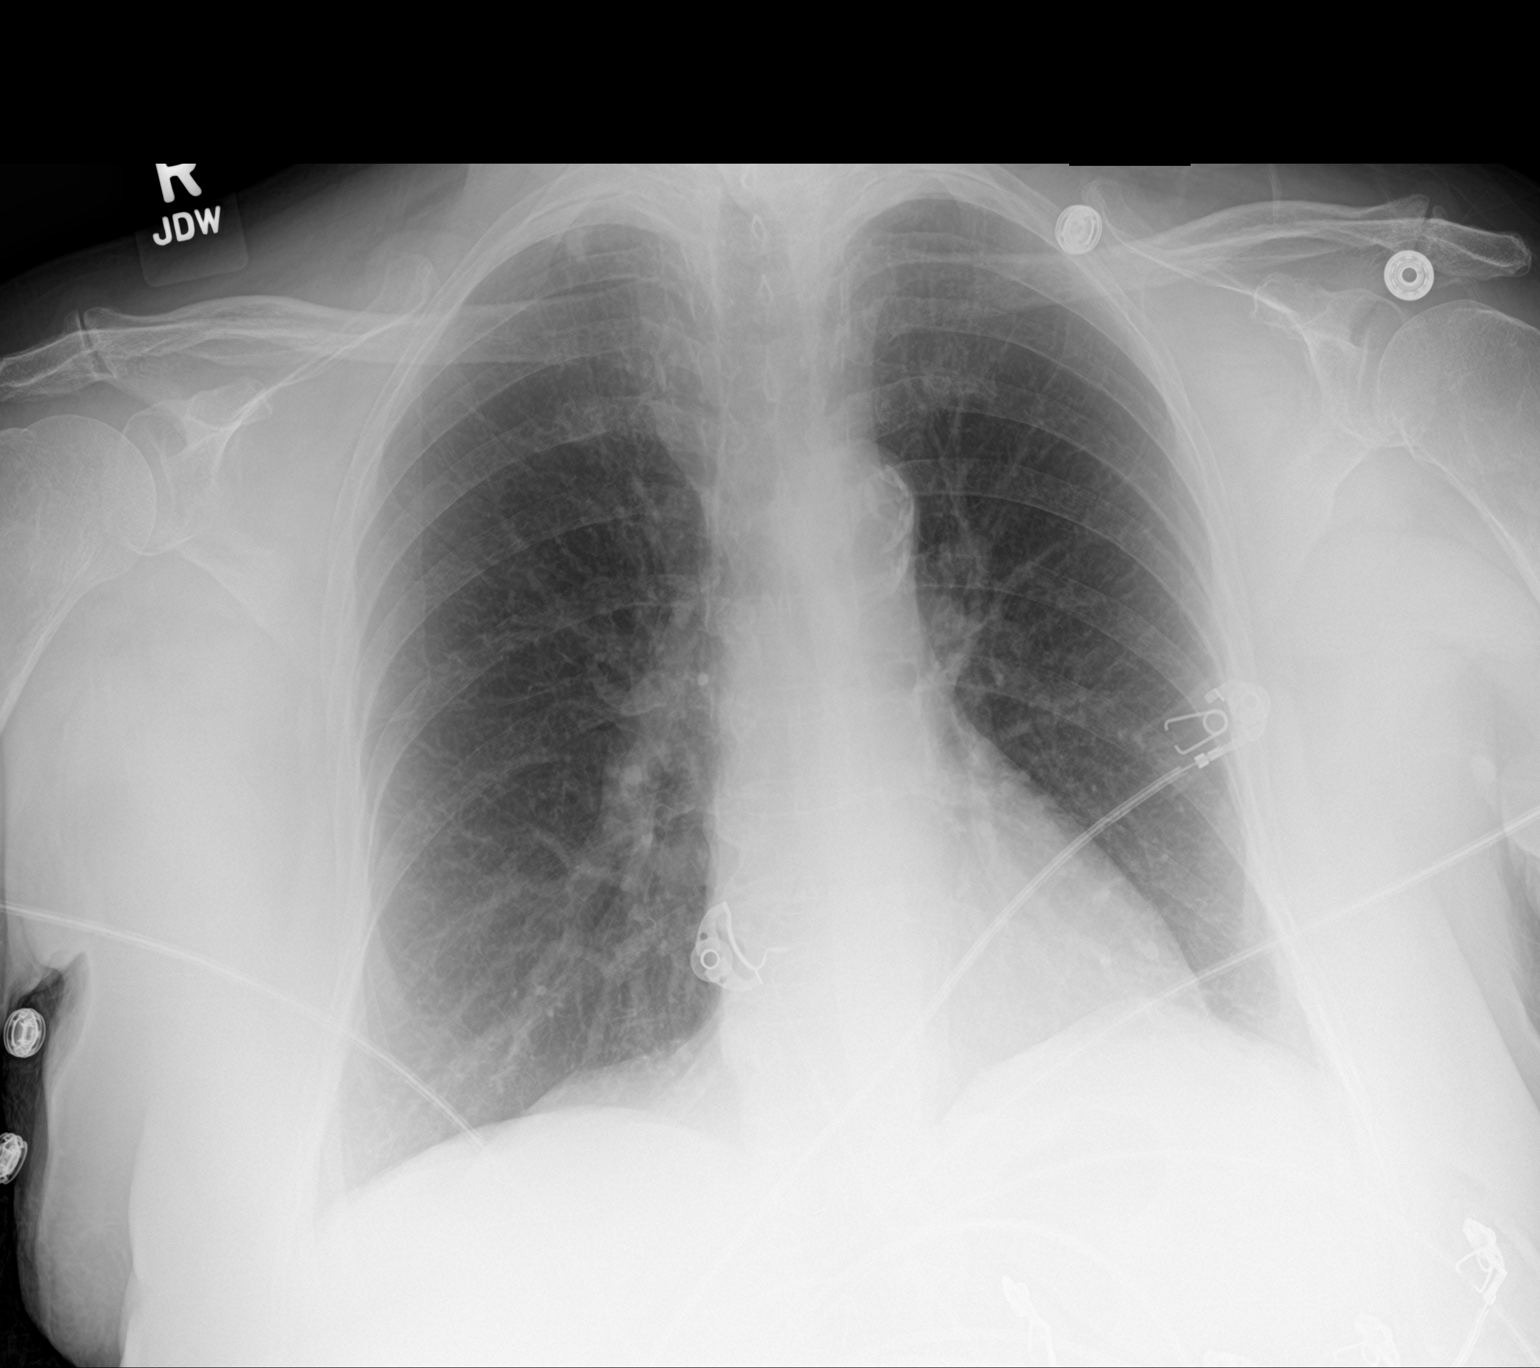

[2 of 2 positions shown; findings below may reference images not displayed]

FINDINGS: The heart size and mediastinal contours are within normal limits.
Aortic atherosclerosis at the arch. Emphysematous hyperinflation of
the lungs. Osteoarthritis of the AC and and left glenohumeral
joints.
IMPRESSION: 1. Emphysematous hyperinflation of the lungs.
2. Aortic atherosclerosis without aneurysm.

## 2018-12-01 IMAGING — US US ABDOMEN COMPLETE
1 series · 14 of 25 positions shown · non-contrast
Comparison: January 19, 2004

CLINICAL DATA: Epigastric pain

EXAM:
ABDOMEN ULTRASOUND COMPLETE

[Series 1: us abdomen complete · 0.21mm/px · 14 of 69 slices shown]
[im 1/69]
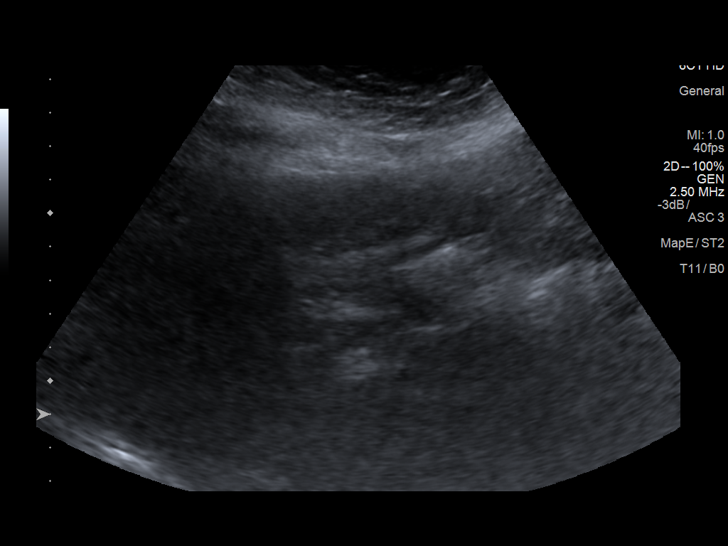
[im 6/69]
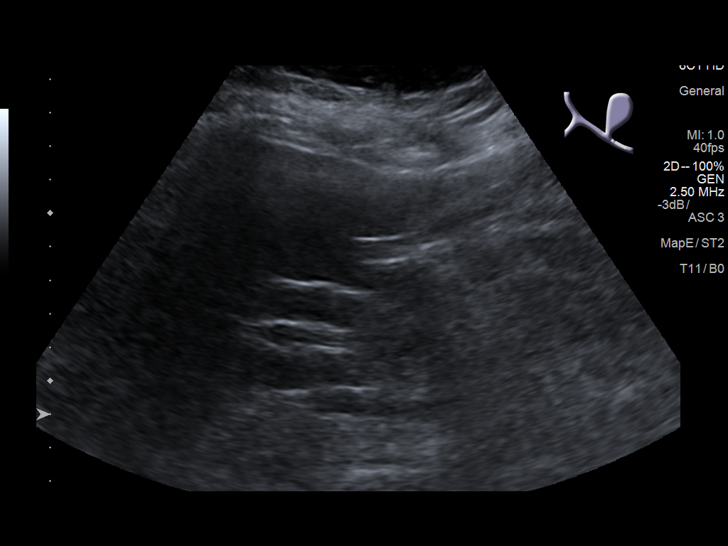
[im 12/69]
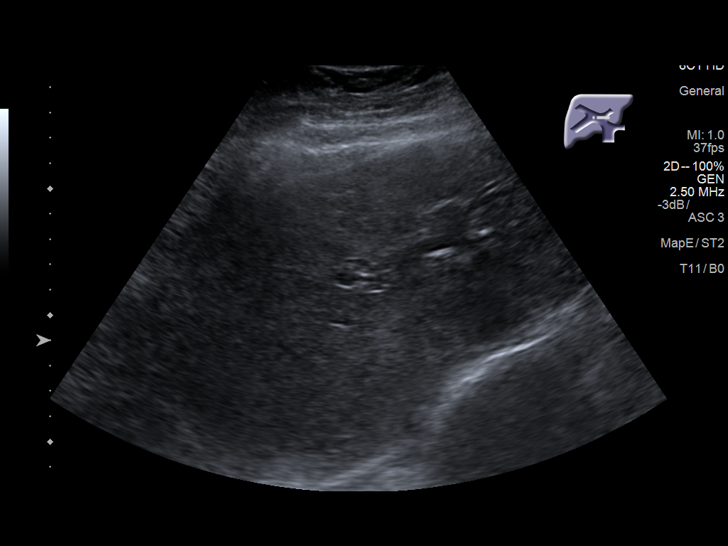
[im 18/69]
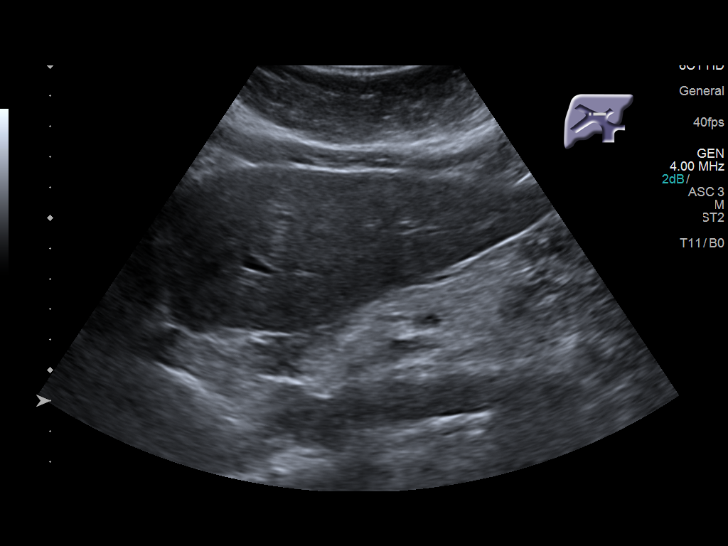
[im 23/69]
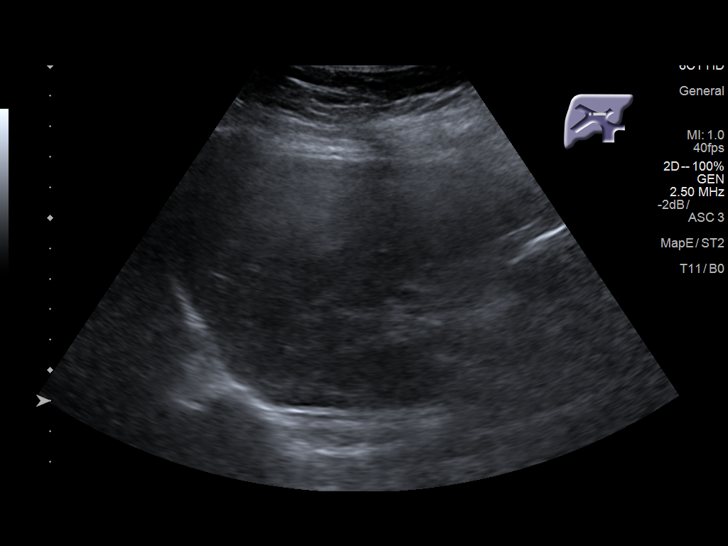
[im 26/69]
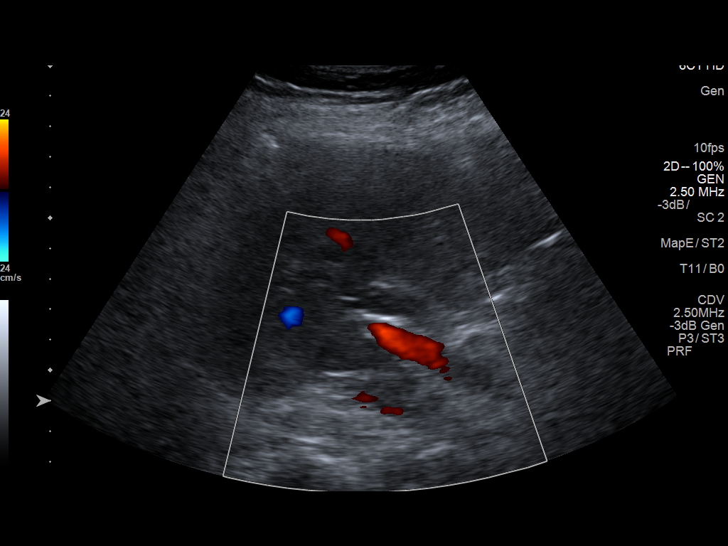
[im 32/69]
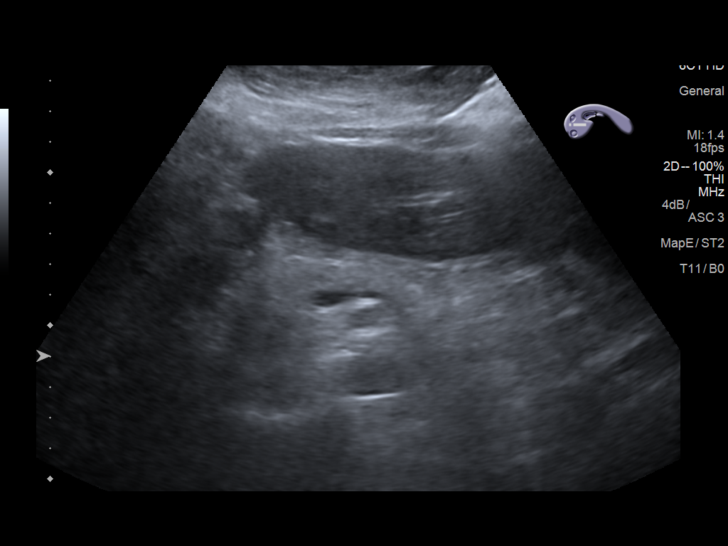
[im 37/69]
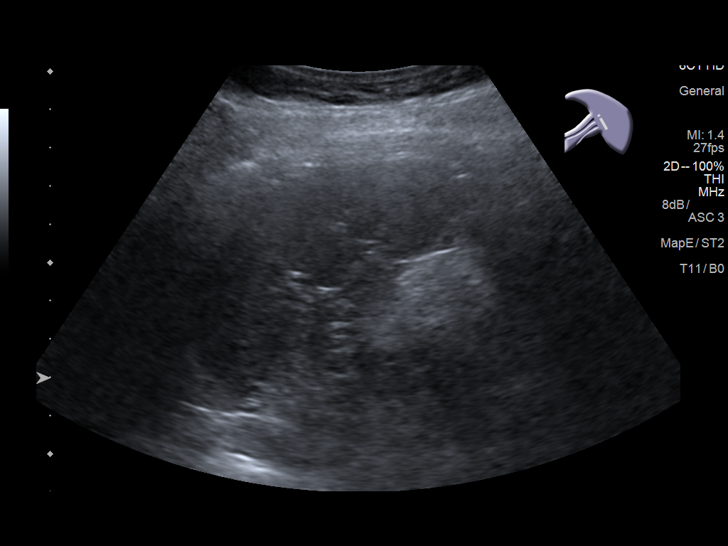
[im 43/69]
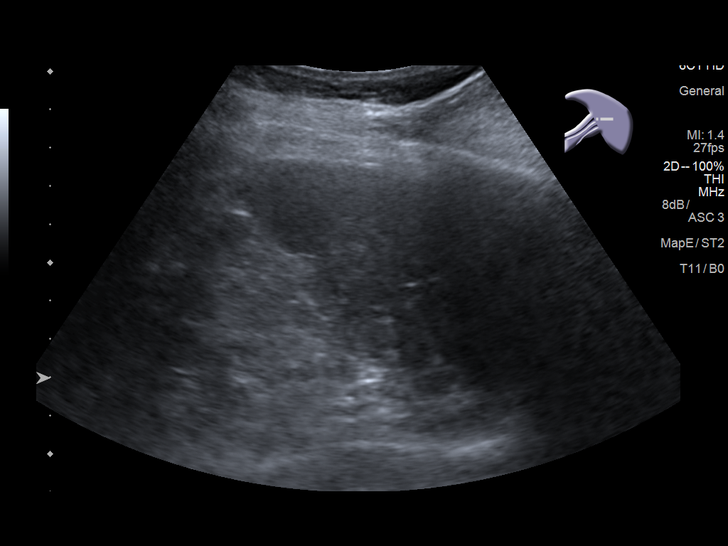
[im 46/69]
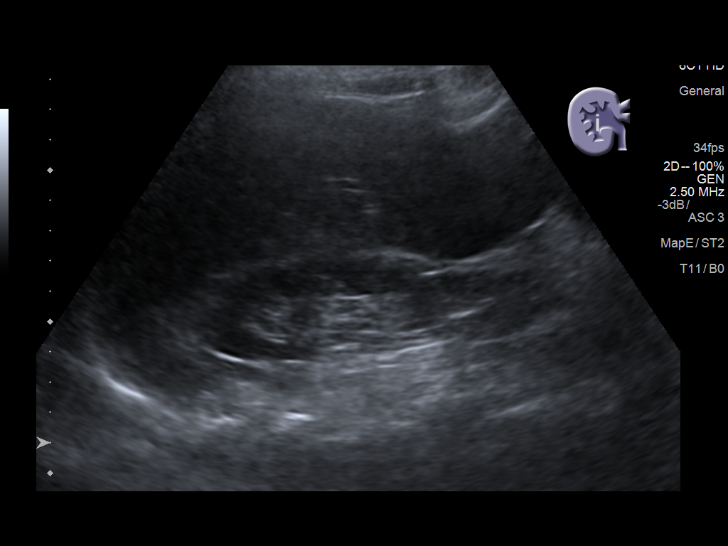
[im 52/69]
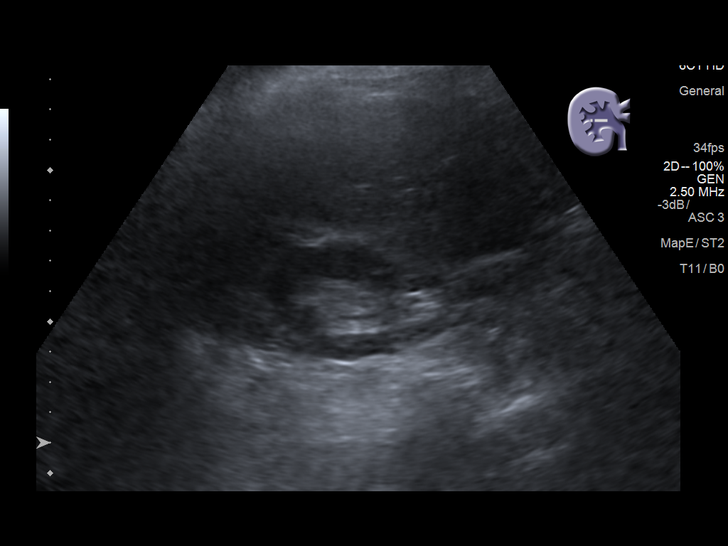
[im 57/69]
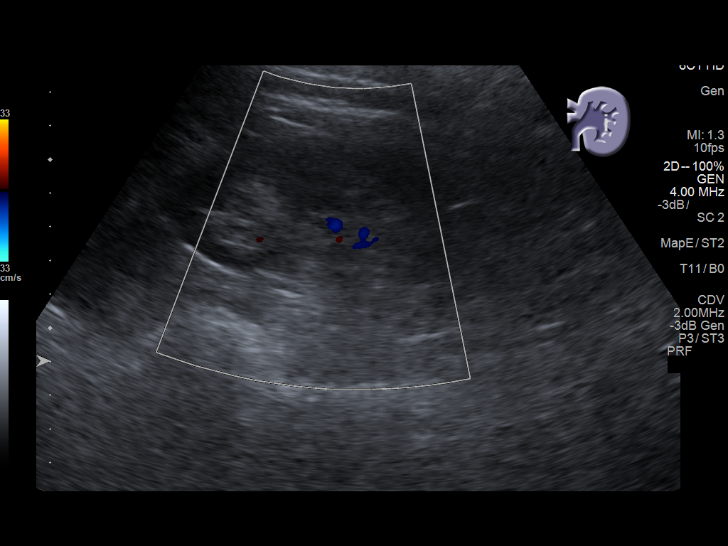
[im 63/69]
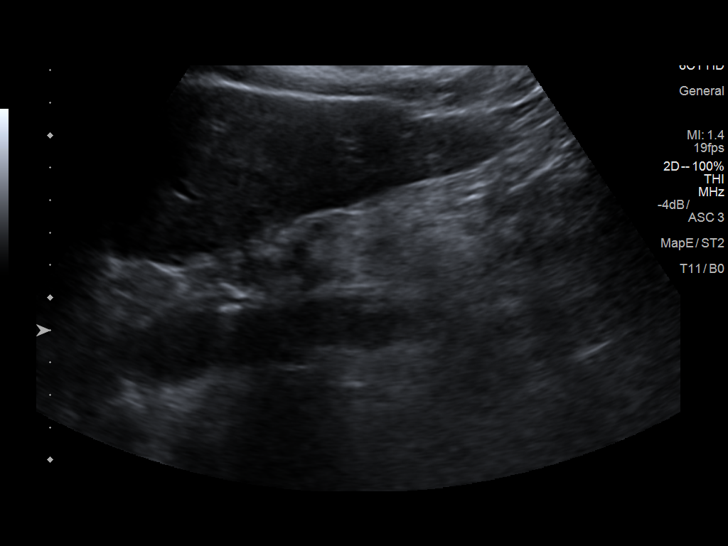
[im 69/69]
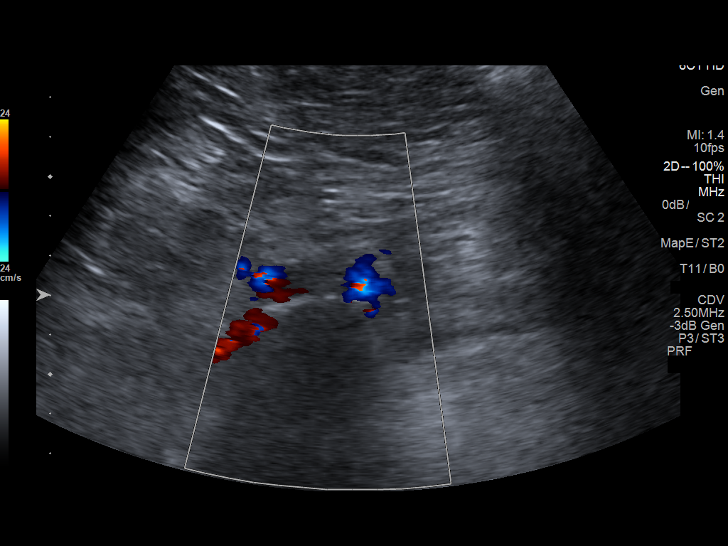

[14 of 25 positions shown; findings below may reference images not displayed]

FINDINGS: Gallbladder: Surgically absent.

Common bile duct: Diameter: 10 mm, upper limits of normal for post
cholecystectomy state. No biliary duct mass or calculus appreciable.

Liver: No focal lesion identified. There is mild intrahepatic
biliary duct dilatation. Within normal limits in parenchymal
echogenicity. Portal vein is patent on color Doppler imaging with
normal direction of blood flow towards the liver.

IVC: No abnormality visualized.

Pancreas: No pancreatic mass or inflammatory focus.

Spleen: Size and appearance within normal limits.

Right Kidney: Length: 9.6 cm. Echogenicity within normal limits. No
mass or hydronephrosis visualized.

Left Kidney: Length: 10.0 cm. Echogenicity within normal limits. No
mass or hydronephrosis visualized.

Abdominal aorta: No aneurysm visualized.

Other findings: No demonstrable ascites.
IMPRESSION: Gallbladder absent. Mild intrahepatic biliary duct dilatation as
well as upper limit normal common bile duct, presumed sequelae of
prior cholecystectomy.

Study otherwise unremarkable.

## 2018-12-06 DIAGNOSIS — I6990 Unspecified sequelae of unspecified cerebrovascular disease: Secondary | ICD-10-CM | POA: Diagnosis not present

## 2018-12-06 DIAGNOSIS — F1320 Sedative, hypnotic or anxiolytic dependence, uncomplicated: Secondary | ICD-10-CM | POA: Diagnosis not present

## 2018-12-06 DIAGNOSIS — R627 Adult failure to thrive: Secondary | ICD-10-CM | POA: Diagnosis not present

## 2018-12-06 DIAGNOSIS — E7849 Other hyperlipidemia: Secondary | ICD-10-CM | POA: Diagnosis not present

## 2018-12-06 DIAGNOSIS — K922 Gastrointestinal hemorrhage, unspecified: Secondary | ICD-10-CM | POA: Diagnosis not present

## 2018-12-06 DIAGNOSIS — D509 Iron deficiency anemia, unspecified: Secondary | ICD-10-CM | POA: Diagnosis not present

## 2018-12-06 DIAGNOSIS — E559 Vitamin D deficiency, unspecified: Secondary | ICD-10-CM | POA: Diagnosis not present

## 2018-12-06 DIAGNOSIS — E876 Hypokalemia: Secondary | ICD-10-CM | POA: Diagnosis not present

## 2018-12-06 DIAGNOSIS — E119 Type 2 diabetes mellitus without complications: Secondary | ICD-10-CM | POA: Diagnosis not present

## 2018-12-06 DIAGNOSIS — E038 Other specified hypothyroidism: Secondary | ICD-10-CM | POA: Diagnosis not present

## 2018-12-06 DIAGNOSIS — Z0000 Encounter for general adult medical examination without abnormal findings: Secondary | ICD-10-CM | POA: Diagnosis not present

## 2018-12-06 DIAGNOSIS — E039 Hypothyroidism, unspecified: Secondary | ICD-10-CM | POA: Diagnosis not present

## 2018-12-06 DIAGNOSIS — E785 Hyperlipidemia, unspecified: Secondary | ICD-10-CM | POA: Diagnosis not present

## 2018-12-06 DIAGNOSIS — I10 Essential (primary) hypertension: Secondary | ICD-10-CM | POA: Diagnosis not present

## 2019-04-02 IMAGING — DX DG CHEST 1V PORT
1 series · 1 of 1 positions shown · non-contrast
Comparison: Chest radiograph dated 07/09/2017

CLINICAL DATA: 88-year-old female with shortness of breath and
wheezing.

EXAM:
PORTABLE CHEST 1 VIEW

[chest ap]
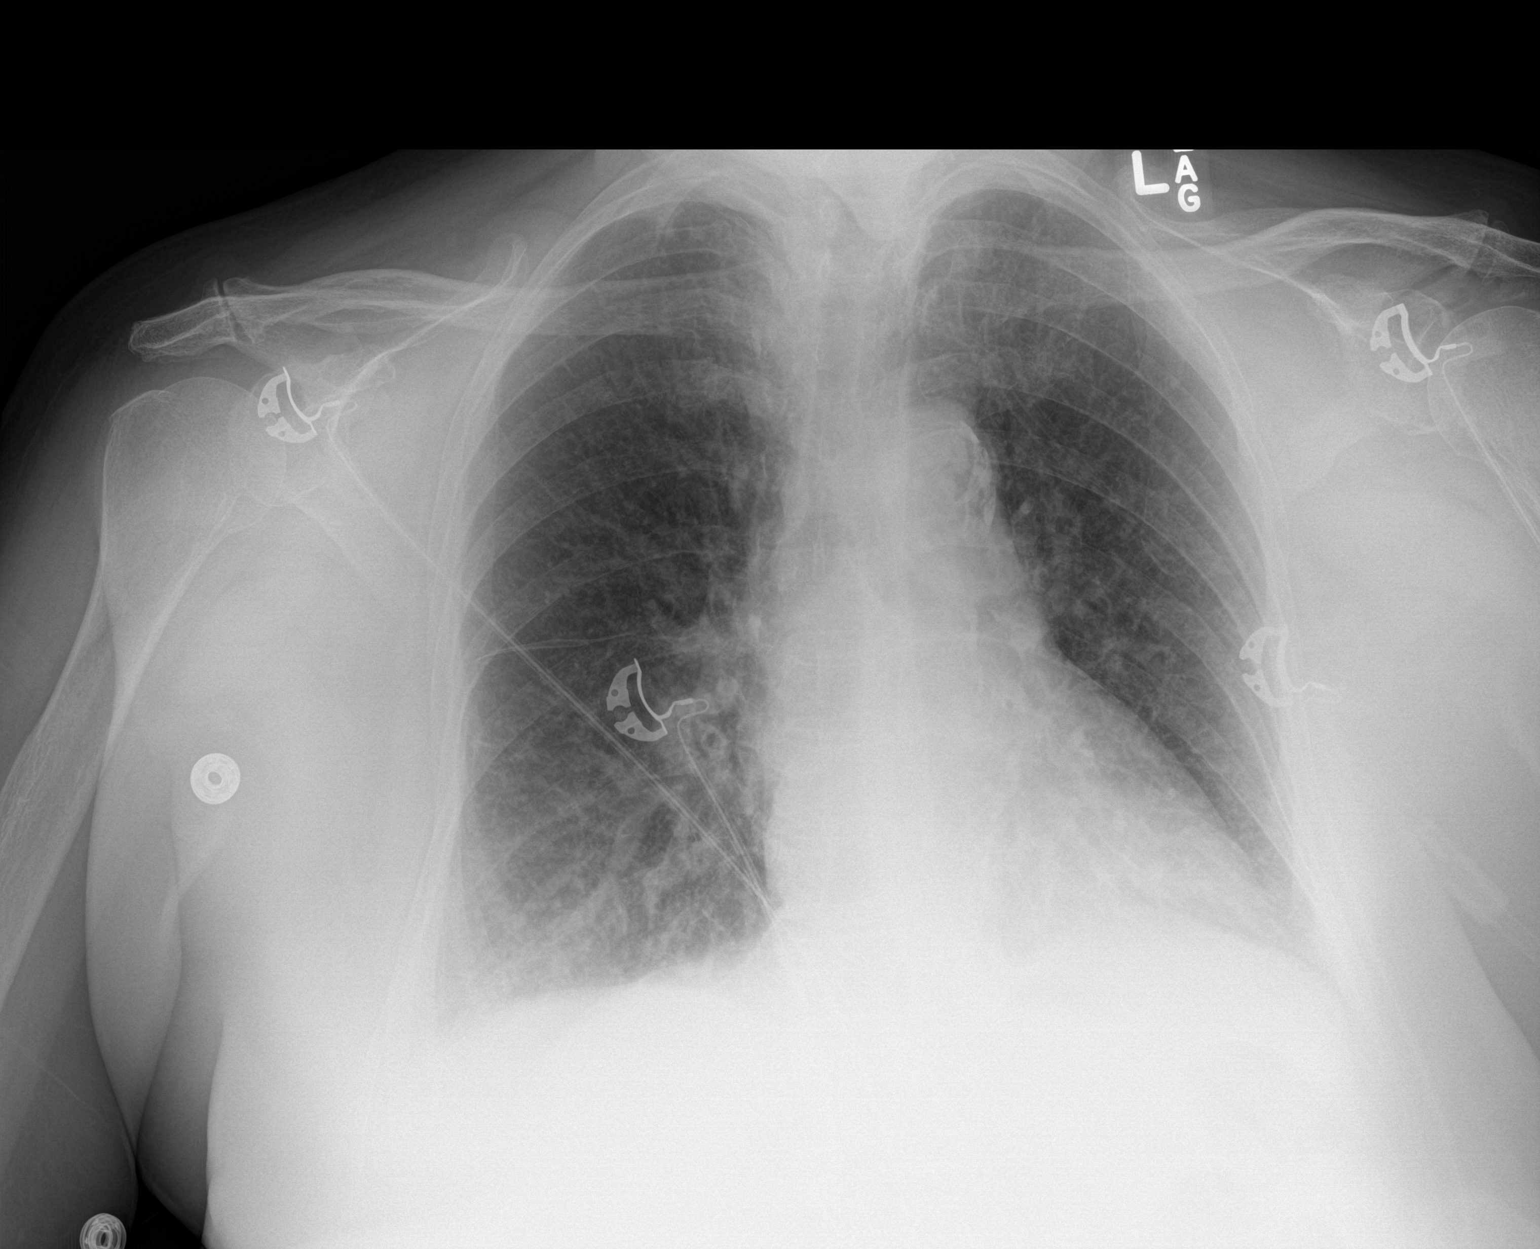

[1 of 1 positions shown; findings below may reference images not displayed]

FINDINGS: Mild diffuse interstitial coarsening. No focal consolidation,
pleural effusion, or pneumothorax. The cardiac silhouette is within
normal limits. Atherosclerotic calcification of the aortic arch. No
acute osseous pathology.
IMPRESSION: No active disease.

## 2019-05-15 DIAGNOSIS — I10 Essential (primary) hypertension: Secondary | ICD-10-CM | POA: Diagnosis not present

## 2019-05-15 DIAGNOSIS — M810 Age-related osteoporosis without current pathological fracture: Secondary | ICD-10-CM | POA: Diagnosis not present

## 2019-05-15 DIAGNOSIS — R627 Adult failure to thrive: Secondary | ICD-10-CM | POA: Diagnosis not present

## 2019-05-15 DIAGNOSIS — E876 Hypokalemia: Secondary | ICD-10-CM | POA: Diagnosis not present

## 2019-05-15 DIAGNOSIS — E039 Hypothyroidism, unspecified: Secondary | ICD-10-CM | POA: Diagnosis not present

## 2019-05-15 DIAGNOSIS — D509 Iron deficiency anemia, unspecified: Secondary | ICD-10-CM | POA: Diagnosis not present

## 2019-05-15 DIAGNOSIS — E785 Hyperlipidemia, unspecified: Secondary | ICD-10-CM | POA: Diagnosis not present

## 2019-05-15 DIAGNOSIS — I6990 Unspecified sequelae of unspecified cerebrovascular disease: Secondary | ICD-10-CM | POA: Diagnosis not present

## 2019-05-15 DIAGNOSIS — M1990 Unspecified osteoarthritis, unspecified site: Secondary | ICD-10-CM | POA: Diagnosis not present

## 2019-05-20 DIAGNOSIS — M1990 Unspecified osteoarthritis, unspecified site: Secondary | ICD-10-CM | POA: Diagnosis not present

## 2019-05-20 DIAGNOSIS — Z23 Encounter for immunization: Secondary | ICD-10-CM | POA: Diagnosis not present

## 2019-05-20 DIAGNOSIS — D509 Iron deficiency anemia, unspecified: Secondary | ICD-10-CM | POA: Diagnosis not present

## 2019-06-25 DIAGNOSIS — M25512 Pain in left shoulder: Secondary | ICD-10-CM | POA: Diagnosis not present

## 2019-06-25 DIAGNOSIS — M13812 Other specified arthritis, left shoulder: Secondary | ICD-10-CM | POA: Diagnosis not present

## 2019-12-09 DIAGNOSIS — M810 Age-related osteoporosis without current pathological fracture: Secondary | ICD-10-CM | POA: Diagnosis not present

## 2019-12-09 DIAGNOSIS — E7849 Other hyperlipidemia: Secondary | ICD-10-CM | POA: Diagnosis not present

## 2019-12-09 DIAGNOSIS — E119 Type 2 diabetes mellitus without complications: Secondary | ICD-10-CM | POA: Diagnosis not present

## 2019-12-09 DIAGNOSIS — D509 Iron deficiency anemia, unspecified: Secondary | ICD-10-CM | POA: Diagnosis not present

## 2019-12-09 DIAGNOSIS — Z0000 Encounter for general adult medical examination without abnormal findings: Secondary | ICD-10-CM | POA: Diagnosis not present

## 2019-12-16 DIAGNOSIS — I10 Essential (primary) hypertension: Secondary | ICD-10-CM | POA: Diagnosis not present

## 2019-12-16 DIAGNOSIS — I6990 Unspecified sequelae of unspecified cerebrovascular disease: Secondary | ICD-10-CM | POA: Diagnosis not present

## 2019-12-16 DIAGNOSIS — D509 Iron deficiency anemia, unspecified: Secondary | ICD-10-CM | POA: Diagnosis not present

## 2019-12-16 DIAGNOSIS — Z0000 Encounter for general adult medical examination without abnormal findings: Secondary | ICD-10-CM | POA: Diagnosis not present

## 2019-12-16 DIAGNOSIS — E876 Hypokalemia: Secondary | ICD-10-CM | POA: Diagnosis not present

## 2019-12-16 DIAGNOSIS — E039 Hypothyroidism, unspecified: Secondary | ICD-10-CM | POA: Diagnosis not present

## 2019-12-16 DIAGNOSIS — E785 Hyperlipidemia, unspecified: Secondary | ICD-10-CM | POA: Diagnosis not present

## 2019-12-16 DIAGNOSIS — E119 Type 2 diabetes mellitus without complications: Secondary | ICD-10-CM | POA: Diagnosis not present

## 2019-12-16 DIAGNOSIS — M1990 Unspecified osteoarthritis, unspecified site: Secondary | ICD-10-CM | POA: Diagnosis not present

## 2020-04-29 DIAGNOSIS — F1320 Sedative, hypnotic or anxiolytic dependence, uncomplicated: Secondary | ICD-10-CM | POA: Diagnosis not present

## 2020-04-29 DIAGNOSIS — F329 Major depressive disorder, single episode, unspecified: Secondary | ICD-10-CM | POA: Diagnosis not present

## 2020-04-29 DIAGNOSIS — E785 Hyperlipidemia, unspecified: Secondary | ICD-10-CM | POA: Diagnosis not present

## 2020-04-29 DIAGNOSIS — E119 Type 2 diabetes mellitus without complications: Secondary | ICD-10-CM | POA: Diagnosis not present

## 2020-04-29 DIAGNOSIS — E039 Hypothyroidism, unspecified: Secondary | ICD-10-CM | POA: Diagnosis not present

## 2020-04-29 DIAGNOSIS — I10 Essential (primary) hypertension: Secondary | ICD-10-CM | POA: Diagnosis not present

## 2020-04-29 DIAGNOSIS — M810 Age-related osteoporosis without current pathological fracture: Secondary | ICD-10-CM | POA: Diagnosis not present

## 2020-04-29 DIAGNOSIS — F419 Anxiety disorder, unspecified: Secondary | ICD-10-CM | POA: Diagnosis not present

## 2020-04-29 DIAGNOSIS — I6990 Unspecified sequelae of unspecified cerebrovascular disease: Secondary | ICD-10-CM | POA: Diagnosis not present

## 2020-06-07 DIAGNOSIS — R627 Adult failure to thrive: Secondary | ICD-10-CM | POA: Diagnosis not present

## 2020-06-07 DIAGNOSIS — F419 Anxiety disorder, unspecified: Secondary | ICD-10-CM | POA: Diagnosis not present

## 2020-06-07 DIAGNOSIS — E119 Type 2 diabetes mellitus without complications: Secondary | ICD-10-CM | POA: Diagnosis not present

## 2020-06-07 DIAGNOSIS — E039 Hypothyroidism, unspecified: Secondary | ICD-10-CM | POA: Diagnosis not present

## 2020-06-07 DIAGNOSIS — F329 Major depressive disorder, single episode, unspecified: Secondary | ICD-10-CM | POA: Diagnosis not present

## 2020-06-07 DIAGNOSIS — I10 Essential (primary) hypertension: Secondary | ICD-10-CM | POA: Diagnosis not present

## 2020-06-16 DIAGNOSIS — E785 Hyperlipidemia, unspecified: Secondary | ICD-10-CM | POA: Diagnosis not present

## 2020-06-16 DIAGNOSIS — E039 Hypothyroidism, unspecified: Secondary | ICD-10-CM | POA: Diagnosis not present

## 2020-06-16 DIAGNOSIS — E119 Type 2 diabetes mellitus without complications: Secondary | ICD-10-CM | POA: Diagnosis not present

## 2020-06-16 DIAGNOSIS — Z23 Encounter for immunization: Secondary | ICD-10-CM | POA: Diagnosis not present

## 2020-06-28 ENCOUNTER — Emergency Department (HOSPITAL_BASED_OUTPATIENT_CLINIC_OR_DEPARTMENT_OTHER)
Admission: EM | Admit: 2020-06-28 | Discharge: 2020-06-29 | Disposition: A | Discharge status: Home / Self Care | Payer: Medicare HMO | Attending: Emergency Medicine | Admitting: Emergency Medicine

## 2020-06-28 ENCOUNTER — Encounter (HOSPITAL_BASED_OUTPATIENT_CLINIC_OR_DEPARTMENT_OTHER): Payer: Self-pay | Admitting: *Deleted

## 2020-06-28 ENCOUNTER — Other Ambulatory Visit: Payer: Self-pay

## 2020-06-28 ENCOUNTER — Emergency Department (HOSPITAL_BASED_OUTPATIENT_CLINIC_OR_DEPARTMENT_OTHER): Payer: Medicare HMO

## 2020-06-28 DIAGNOSIS — E119 Type 2 diabetes mellitus without complications: Secondary | ICD-10-CM | POA: Diagnosis not present

## 2020-06-28 DIAGNOSIS — E039 Hypothyroidism, unspecified: Secondary | ICD-10-CM | POA: Diagnosis not present

## 2020-06-28 DIAGNOSIS — Z87891 Personal history of nicotine dependence: Secondary | ICD-10-CM | POA: Insufficient documentation

## 2020-06-28 DIAGNOSIS — Z79890 Hormone replacement therapy (postmenopausal): Secondary | ICD-10-CM | POA: Insufficient documentation

## 2020-06-28 DIAGNOSIS — M7989 Other specified soft tissue disorders: Secondary | ICD-10-CM | POA: Diagnosis not present

## 2020-06-28 DIAGNOSIS — X501XXA Overexertion from prolonged static or awkward postures, initial encounter: Secondary | ICD-10-CM | POA: Insufficient documentation

## 2020-06-28 DIAGNOSIS — M79662 Pain in left lower leg: Secondary | ICD-10-CM | POA: Diagnosis not present

## 2020-06-28 DIAGNOSIS — R609 Edema, unspecified: Secondary | ICD-10-CM | POA: Diagnosis not present

## 2020-06-28 DIAGNOSIS — M25562 Pain in left knee: Secondary | ICD-10-CM

## 2020-06-28 DIAGNOSIS — S8992XA Unspecified injury of left lower leg, initial encounter: Secondary | ICD-10-CM | POA: Diagnosis not present

## 2020-06-28 DIAGNOSIS — M25552 Pain in left hip: Secondary | ICD-10-CM | POA: Diagnosis not present

## 2020-06-28 DIAGNOSIS — Z79899 Other long term (current) drug therapy: Secondary | ICD-10-CM | POA: Diagnosis not present

## 2020-06-28 DIAGNOSIS — I10 Essential (primary) hypertension: Secondary | ICD-10-CM | POA: Diagnosis not present

## 2020-06-28 DIAGNOSIS — M79652 Pain in left thigh: Secondary | ICD-10-CM | POA: Diagnosis not present

## 2020-06-28 NOTE — ED Notes (Signed)
Pt. L knee is swollen and the Pt. Is unable to bend the L knee or Lift the L leg due to pain.  Pt. Stated she may have twisted the L leg tonight when getting out of a chair.  Pt. Is in a bit of pain with lying still and reports more pain with any L leg movement.

## 2020-06-28 NOTE — ED Triage Notes (Signed)
L knee Pain per Pt. She twisted the L knee yesterday and the L knee is swollen.  188/77 HR 100 18 Resp   Sat 96% RA all per EMS on Arrival

## 2020-06-28 NOTE — ED Triage Notes (Signed)
Pt. Reports the L leg hurts up into the L hip area and she can hardly stand on the L leg.  Pt. Said she went to stand up at up at around 2115 and that is when she felt the L knee may have gotten twisted and now she is unable to stand the pain or stand on the L leg.

## 2020-06-29 ENCOUNTER — Encounter (HOSPITAL_BASED_OUTPATIENT_CLINIC_OR_DEPARTMENT_OTHER): Payer: Self-pay | Admitting: Emergency Medicine

## 2020-06-29 ENCOUNTER — Emergency Department (HOSPITAL_BASED_OUTPATIENT_CLINIC_OR_DEPARTMENT_OTHER): Payer: Medicare HMO

## 2020-06-29 DIAGNOSIS — R279 Unspecified lack of coordination: Secondary | ICD-10-CM | POA: Diagnosis not present

## 2020-06-29 DIAGNOSIS — M25562 Pain in left knee: Secondary | ICD-10-CM | POA: Diagnosis not present

## 2020-06-29 DIAGNOSIS — M79605 Pain in left leg: Secondary | ICD-10-CM | POA: Diagnosis not present

## 2020-06-29 DIAGNOSIS — Z743 Need for continuous supervision: Secondary | ICD-10-CM | POA: Diagnosis not present

## 2020-06-29 DIAGNOSIS — M79662 Pain in left lower leg: Secondary | ICD-10-CM | POA: Diagnosis not present

## 2020-06-29 DIAGNOSIS — M79652 Pain in left thigh: Secondary | ICD-10-CM | POA: Diagnosis not present

## 2020-06-29 DIAGNOSIS — R52 Pain, unspecified: Secondary | ICD-10-CM | POA: Diagnosis not present

## 2020-06-29 MED ORDER — KETOROLAC TROMETHAMINE 60 MG/2ML IM SOLN
30.0000 mg | Freq: Once | INTRAMUSCULAR | Status: AC
  Administered 2020-06-29: 30 mg via INTRAMUSCULAR
  Filled 2020-06-29: qty 2

## 2020-06-29 MED ORDER — LIDOCAINE 5 % EX PTCH: 1.0000 | MEDICATED_PATCH | CUTANEOUS | 0 refills | Status: DC

## 2020-06-29 MED ORDER — DICLOFENAC SODIUM ER 100 MG PO TB24: 100.0000 mg | ORAL_TABLET | Freq: Every day | ORAL | 0 refills | Status: DC

## 2020-06-29 MED ORDER — LIDOCAINE 5 % EX PTCH
3.0000 | MEDICATED_PATCH | CUTANEOUS | Status: DC
  Administered 2020-06-29: 3 via TRANSDERMAL
  Filled 2020-06-29: qty 3

## 2020-06-29 MED ORDER — TRAMADOL HCL 50 MG PO TABS
50.0000 mg | ORAL_TABLET | ORAL | Status: AC
  Administered 2020-06-29: 50 mg via ORAL
  Filled 2020-06-29: qty 1

## 2020-06-29 MED ORDER — ACETAMINOPHEN 500 MG PO TABS
1000.0000 mg | ORAL_TABLET | Freq: Once | ORAL | Status: AC
  Administered 2020-06-29: 1000 mg via ORAL
  Filled 2020-06-29: qty 2

## 2020-06-29 NOTE — ED Provider Notes (Signed)
[facility] EMERGENCY DEPARTMENT Provider Note   CSN: [id number] Arrival date & time: 06/28/20  2230     History Chief Complaint  Patient presents with  . Knee Pain  . Leg Pain    [name] is a 86 y.o. female.  The history is provided by the patient and a relative.  Knee Pain Location:  Knee Time since incident:  2 days Injury: yes   Mechanism of injury comment:  Twisted leg on Sunday and then twisted again tonight  Knee location:  L knee Pain details:    Quality:  Aching   Radiates to:  Does not radiate   Severity:  Severe   Onset quality:  Sudden   Duration:  2 days   Timing:  Constant   Progression:  Unchanged Chronicity:  New Dislocation: no   Foreign body present:  No foreign bodies Prior injury to area:  No Relieved by:  Nothing Worsened by:  Nothing Ineffective treatments:  None tried Associated symptoms: no back pain and no fever   Risk factors: no concern for non-accidental trauma   Patient with h/o CVA presents after twisting knee on Sunday and then again this evening and now cannot bend the knee secondary to pain.  No swelling.  No changes in color.  No weakness, no numbness.       Past Medical History:  Diagnosis Date  . Anemia   . Anxiety   . Colon polyps   . DDD (degenerative disc disease), cervical   . Diabetes mellitus   . Diabetes mellitus, type 2 (HCC)   . Hyperlipidemia   . Hypertension   . Hypothyroidism   . Leukemia (HCC) in remission  . Mucous retention cyst of maxillary sinus   . Osteoporosis   . Pinched nerve In neck  . Plantar fasciitis   . Stroke (HCC)   . Vitamin D deficiency     Patient Active Problem List   Diagnosis Date Noted  . Polyp of rectum   . AVM (arteriovenous malformation) of colon   . AVM (arteriovenous malformation) of duodenum, acquired   . Lower GI bleed 12/02/2017  . GIB (gastrointestinal bleeding) 12/01/2017  . Hyponatremia 12/01/2017  . Hypokalemia 12/01/2017  . HTN (hypertension)  12/01/2017  . Diabetes mellitus without complication (HCC) 07/14/2017  . Acute pancreatitis 07/12/2017  . Elevated transaminase level 07/12/2017  . Chest pain 07/10/2017  . Hyperlipidemia   . Anxiety   . Stroke (HCC)   . Hypothyroidism   . Iron deficiency anemia, unspecified 02/24/2013  . Nonspecific abnormal finding in stool contents 02/24/2013  . Personal history of colonic polyps 02/24/2013  . Symptomatic anemia     Past Surgical History:  Procedure Laterality Date  . APPENDECTOMY    . CHOLECYSTECTOMY     20 04  . COLONOSCOPY N/A 02/24/2013   Procedure: COLONOSCOPY;  Surgeon: Ladene Artist, MD;  Location: [facility];  Service: Endoscopy;  Laterality: N/A;  . COLONOSCOPY W/ POLYPECTOMY    . COLONOSCOPY WITH PROPOFOL N/A 12/03/2017   Procedure: COLONOSCOPY WITH PROPOFOL;  Surgeon: [name], MD;  Location: [facility];  Service: Gastroenterology;  Laterality: N/A;  . ESOPHAGOGASTRODUODENOSCOPY    . ESOPHAGOGASTRODUODENOSCOPY N/A 02/24/2013   Procedure: ESOPHAGOGASTRODUODENOSCOPY (EGD);  Surgeon: Ladene Artist, MD;  Location: [facility];  Service: Endoscopy;  Laterality: N/A;  . ESOPHAGOGASTRODUODENOSCOPY (EGD) WITH PROPOFOL N/A 12/03/2017   Procedure: ESOPHAGOGASTRODUODENOSCOPY (EGD) WITH PROPOFOL;  Surgeon: [name], MD;  Location: [facility];  Service: Gastroenterology;  Laterality: N/A;  . EYE SURGERY     bilateral catracts     OB History   No obstetric history on file.     Family History  Problem Relation Age of Onset  . Diabetes Other     Social History   Tobacco Use  . Smoking status: Former Research scientist (life sciences)  . Smokeless tobacco: Never Used  Vaping Use  . Vaping Use: Former  Substance Use Topics  . Alcohol use: No  . Drug use: No    Home Medications Prior to Admission medications   Medication Sig Start Date End Date Taking? Authorizing Provider  albuterol (PROVENTIL HFA;VENTOLIN HFA) 108 (90 Base) MCG/ACT inhaler Inhale 1-2 puffs into the  lungs every 6 (six) hours as needed for wheezing or shortness of breath. 12/05/17   Jola Schmidt, MD  ALPRAZolam Duanne Moron) 0.5 MG tablet Take 0.5 mg by mouth 3 (three) times daily as needed. For anxiety    [provider]  amLODipine (NORVASC) 2.5 MG tablet Take 10 mg by mouth daily.     [provider]  atorvastatin (LIPITOR) 20 MG tablet Take 20 mg by mouth daily.    [provider]  betamethasone dipropionate (DIPROLENE) 0.05 % cream Apply 1 application topically 2 (two) times daily.    [provider]  ferrous sulfate 325 (65 FE) MG EC tablet Take 1 tablet (325 mg total) by mouth daily with breakfast. 12/04/17   Shelly Coss, MD  levothyroxine (SYNTHROID, LEVOTHROID) 150 MCG tablet Take 150 mcg by mouth daily.    [provider]  olmesartan-hydrochlorothiazide (BENICAR HCT) 40-25 MG tablet Take 1 tablet by mouth daily. 10/15/17   [provider]  pantoprazole (PROTONIX) 40 MG tablet Take 1 tablet (40 mg total) by mouth daily. 12/04/17 01/03/18  Shelly Coss, MD  potassium chloride SA (K-DUR,KLOR-CON) 20 MEQ tablet Take 20 mEq by mouth daily.    [provider]  traZODone (DESYREL) 50 MG tablet Take 50 mg by mouth at bedtime. 11/20/17   [provider]    Allergies    Prednisone  Review of Systems   Review of Systems  Constitutional: Negative for fever.  HENT: Negative for congestion.   Eyes: Negative for visual disturbance.  Respiratory: Negative for shortness of breath.   Cardiovascular: Negative for chest pain.  Gastrointestinal: Negative for abdominal pain.  Genitourinary: Negative for difficulty urinating.  Musculoskeletal: Positive for arthralgias. Negative for back pain.  Skin: Negative for rash.  Neurological: Negative for dizziness.  Psychiatric/Behavioral: Negative for agitation.  All other systems reviewed and are negative.   Physical Exam Updated Vital Signs BP (!) 146/64   Pulse (!) 107   Temp  97.8 F (36.6 C) (Oral)   Resp 20   Ht 5' (1.524 m)   Wt 71.7 kg   SpO2 93%   BMI 30.86 kg/m   Physical Exam Vitals and nursing note reviewed.  Constitutional:      General: She is not in acute distress.    Appearance: Normal appearance.  HENT:     Head: Normocephalic.     Nose: Nose normal.  Eyes:     Conjunctiva/sclera: Conjunctivae normal.     Pupils: Pupils are equal, round, and reactive to light.  Cardiovascular:     Rate and Rhythm: Normal rate and regular rhythm.     Pulses: Normal pulses.     Heart sounds: Normal heart sounds.  Pulmonary:     Effort: Pulmonary effort is normal.     Breath sounds:  Normal breath sounds.  Abdominal:     General: Abdomen is flat. Bowel sounds are normal.     Palpations: Abdomen is soft.     Tenderness: There is no abdominal tenderness. There is no guarding.  Musculoskeletal:     Cervical back: Normal range of motion and neck supple.     Left hip: Normal.     Left upper leg: Normal.     Left knee: Normal. No swelling, deformity, effusion, erythema, ecchymosis, lacerations, bony tenderness or crepitus. Normal range of motion. No tenderness. No LCL laxity, MCL laxity, ACL laxity or PCL laxity.Normal alignment, normal meniscus and normal patellar mobility. Normal pulse.     Instability Tests: Anterior drawer test negative. Posterior drawer test negative.     Left lower leg: Normal.     Left ankle: Normal.     Left Achilles Tendon: Normal.     Right foot: Normal.     Left foot: Normal.     Comments: Intact patellar and quadriceps tendons.  No patella alta or baja.  No effusions.  No instabilty of the L knee to varus or valgus stress.    Skin:    General: Skin is warm and dry.     Capillary Refill: Capillary refill takes less than 2 seconds.  Neurological:     General: No focal deficit present.     Mental Status: She is alert and oriented to person, place, and time.  Psychiatric:        Mood and Affect: Mood normal.        Behavior:  Behavior normal.     ED Results / Procedures / Treatments   Labs (all labs ordered are listed, but only abnormal results are displayed) Labs Reviewed - No data to display  EKG None  Radiology DG Tibia/Fibula Left  Result Date: 06/29/2020 CLINICAL DATA:  86 year old female with pain after fall. EXAM: LEFT TIBIA AND FIBULA - 2 VIEW COMPARISON:  Left hip and femur series tonight. FINDINGS: These images include lateral views of the distal left femur and knee. Visible femur intact. Preserved alignment at the left knee. No evidence of joint effusion. Anterior soft tissue injury overlying the patella. Patella appears intact. Left tibia and fibula appear intact with maintained alignment at the left ankle. Grossly intact visible calcaneus. No discrete soft tissue injury. IMPRESSION: No acute fracture or dislocation identified. Evidence of soft tissue injury overlying the patella. Electronically Signed   By: Genevie Ann M.D.   On: 06/29/2020 01:45   DG Knee Complete 4 Views Left  Result Date: 06/28/2020 CLINICAL DATA:  Left knee pain and swelling following twisting injury, initial encounter EXAM: LEFT KNEE - COMPLETE 4+ VIEW COMPARISON:  None. FINDINGS: No acute fracture or dislocation is noted. No soft tissue abnormality is seen. IMPRESSION: No acute abnormality noted. Electronically Signed   By: Inez Catalina M.D.   On: 06/28/2020 23:49   DG Hip Unilat W or Wo Pelvis 2-3 Views Left  Result Date: 06/28/2020 CLINICAL DATA:  Left hip pain following twisting injury yesterday, initial encounter EXAM: DG HIP (WITH OR WITHOUT PELVIS) 3V LEFT COMPARISON:  None. FINDINGS: Pelvic ring is intact. No acute fracture or dislocation is noted. Low corticated bony density is noted adjacent to the intratrochanteric region posteriorly likely related to prior avulsion. Pain is noted overlying the pelvis likely extrinsic to the patient IMPRESSION: Chronic changes without acute abnormality. Electronically Signed   By: Inez Catalina M.D.   On: 06/28/2020 23:48   DG  Femur Min 2 Views Left  Result Date: 06/29/2020 CLINICAL DATA:  86 year old female with pain after fall. EXAM: LEFT FEMUR 2 VIEWS COMPARISON:  Left hip series earlier tonight. FINDINGS: These 2 images include all of the left femoral shaft which is intact. Bone mineralization is within normal limits for age. Grossly normal alignment at the left hip and knee. Visible soft tissue contours appear within normal limits. IMPRESSION: No acute fracture or dislocation identified in the left femur. Electronically Signed   By: Genevie Ann M.D.   On: 06/29/2020 01:43    Procedures Procedures (including critical care time)  Medications Ordered in ED Medications  lidocaine (LIDODERM) 5 % 3 patch (3 patches Transdermal Patch Applied 06/29/20 0027)  ketorolac (TORADOL) injection 30 mg (30 mg Intramuscular Given 06/29/20 0032)  acetaminophen (TYLENOL) tablet 1,000 mg (1,000 mg Oral Given 06/29/20 0028)  traMADol (ULTRAM) tablet 50 mg (50 mg Oral Given 06/29/20 0059)    ED Course  I have reviewed the triage vital signs and the nursing notes.  Pertinent labs & imaging results that were available during my care of the patient were reviewed by me and considered in my medical decision making (see chart for details).    No acute finding on Xray or exam.  Patient placed in immobilizer for comfort and will have patient follow up with orthopedics.  Ice elevation and NSAIDs.    [name] was evaluated in Emergency Department on 06/29/2020 for the symptoms described in the history of present illness. She was evaluated in the context of the global COVID-19 pandemic, which necessitated consideration that the patient might be at risk for infection with the SARS-CoV-2 virus that causes COVID-19. Institutional protocols and algorithms that pertain to the evaluation of patients at risk for COVID-19 are in a state of rapid change based on information released by regulatory bodies  including the CDC and federal and state organizations. These policies and algorithms were followed during the patient's care in the ED.  Final Clinical Impression(s) / ED Diagnoses Return for intractable cough, coughing up blood,fevers >100.4 unrelieved by medication, shortness of breath, intractable vomiting, chest pain, shortness of breath, weakness,numbness, changes in speech, facial asymmetry,abdominal pain, passing out,Inability to tolerate liquids or food, cough, altered mental status or any concerns. No signs of systemic illness or infection. The patient is nontoxic-appearing on exam and vital signs are within normal limits.   I have reviewed the triage vital signs and the nursing notes. Pertinent labs &imaging results that were available during my care of the patient were reviewed by me and considered in my medical decision making (see chart for details).After history, exam, and medical workup I feel the patient has beenappropriately medically screened and is safe for discharge home. Pertinent diagnoses were discussed with the patient. Patient was given return precautions.    Oseas Detty, MD 06/29/20 0222

## 2020-06-29 NOTE — ED Notes (Addendum)
Pt. Unable to walk on the L leg.  Attempted to walk the Pt. From the bedside to the sink with 2 assist and Pt. Unable to walk on the L leg and unable to bend the L knee.  Pt. Has pain with standing and pain with weight bearing.

## 2020-06-29 NOTE — ED Notes (Signed)
Notified son that [name] was here to transport pt home

## 2020-06-29 NOTE — ED Notes (Signed)
ED Provider at bedside. 

## 2020-06-29 NOTE — ED Notes (Signed)
Waiting on [facility]... 

## 2020-06-29 NOTE — ED Notes (Signed)
Called [facility] to transport pt home. No ETA per Renard Hamper

## 2020-07-05 ENCOUNTER — Other Ambulatory Visit (HOSPITAL_COMMUNITY): Payer: Self-pay | Admitting: Orthopedic Surgery

## 2020-07-05 ENCOUNTER — Other Ambulatory Visit: Payer: Self-pay | Admitting: Orthopedic Surgery

## 2020-07-05 DIAGNOSIS — M25562 Pain in left knee: Secondary | ICD-10-CM

## 2020-07-05 DIAGNOSIS — M25552 Pain in left hip: Secondary | ICD-10-CM

## 2020-07-09 ENCOUNTER — Ambulatory Visit (HOSPITAL_COMMUNITY)
Admission: RE | Admit: 2020-07-09 | Discharge: 2020-07-09 | Disposition: A | Discharge status: Home / Self Care | Payer: Medicare HMO | Source: Ambulatory Visit | Attending: Orthopedic Surgery | Admitting: Orthopedic Surgery

## 2020-07-09 DIAGNOSIS — M1612 Unilateral primary osteoarthritis, left hip: Secondary | ICD-10-CM | POA: Diagnosis not present

## 2020-07-09 DIAGNOSIS — M25552 Pain in left hip: Secondary | ICD-10-CM

## 2020-07-09 DIAGNOSIS — M25562 Pain in left knee: Secondary | ICD-10-CM | POA: Diagnosis not present

## 2020-07-09 DIAGNOSIS — R600 Localized edema: Secondary | ICD-10-CM | POA: Diagnosis not present

## 2020-07-15 ENCOUNTER — Other Ambulatory Visit (HOSPITAL_COMMUNITY): Payer: Medicare HMO

## 2020-07-15 ENCOUNTER — Ambulatory Visit (HOSPITAL_COMMUNITY): Payer: Medicare HMO

## 2020-07-19 DIAGNOSIS — M25562 Pain in left knee: Secondary | ICD-10-CM | POA: Diagnosis not present

## 2020-08-13 DIAGNOSIS — S76012D Strain of muscle, fascia and tendon of left hip, subsequent encounter: Secondary | ICD-10-CM | POA: Diagnosis not present

## 2020-08-13 DIAGNOSIS — E039 Hypothyroidism, unspecified: Secondary | ICD-10-CM | POA: Diagnosis not present

## 2020-08-13 DIAGNOSIS — I10 Essential (primary) hypertension: Secondary | ICD-10-CM | POA: Diagnosis not present

## 2020-08-13 DIAGNOSIS — E785 Hyperlipidemia, unspecified: Secondary | ICD-10-CM | POA: Diagnosis not present

## 2020-08-13 DIAGNOSIS — M5002 Cervical disc disorder with myelopathy, mid-cervical region, unspecified level: Secondary | ICD-10-CM | POA: Diagnosis not present

## 2020-08-13 DIAGNOSIS — E119 Type 2 diabetes mellitus without complications: Secondary | ICD-10-CM | POA: Diagnosis not present

## 2020-08-13 DIAGNOSIS — S83242D Other tear of medial meniscus, current injury, left knee, subsequent encounter: Secondary | ICD-10-CM | POA: Diagnosis not present

## 2020-08-13 DIAGNOSIS — D509 Iron deficiency anemia, unspecified: Secondary | ICD-10-CM | POA: Diagnosis not present

## 2020-08-13 DIAGNOSIS — S83282D Other tear of lateral meniscus, current injury, left knee, subsequent encounter: Secondary | ICD-10-CM | POA: Diagnosis not present

## 2020-08-17 DIAGNOSIS — I10 Essential (primary) hypertension: Secondary | ICD-10-CM | POA: Diagnosis not present

## 2020-08-17 DIAGNOSIS — E119 Type 2 diabetes mellitus without complications: Secondary | ICD-10-CM | POA: Diagnosis not present

## 2020-08-17 DIAGNOSIS — E039 Hypothyroidism, unspecified: Secondary | ICD-10-CM | POA: Diagnosis not present

## 2020-08-17 DIAGNOSIS — M5002 Cervical disc disorder with myelopathy, mid-cervical region, unspecified level: Secondary | ICD-10-CM | POA: Diagnosis not present

## 2020-08-17 DIAGNOSIS — S83242D Other tear of medial meniscus, current injury, left knee, subsequent encounter: Secondary | ICD-10-CM | POA: Diagnosis not present

## 2020-08-17 DIAGNOSIS — S76012D Strain of muscle, fascia and tendon of left hip, subsequent encounter: Secondary | ICD-10-CM | POA: Diagnosis not present

## 2020-08-17 DIAGNOSIS — D509 Iron deficiency anemia, unspecified: Secondary | ICD-10-CM | POA: Diagnosis not present

## 2020-08-17 DIAGNOSIS — S83282D Other tear of lateral meniscus, current injury, left knee, subsequent encounter: Secondary | ICD-10-CM | POA: Diagnosis not present

## 2020-08-17 DIAGNOSIS — E785 Hyperlipidemia, unspecified: Secondary | ICD-10-CM | POA: Diagnosis not present

## 2020-08-19 DIAGNOSIS — D509 Iron deficiency anemia, unspecified: Secondary | ICD-10-CM | POA: Diagnosis not present

## 2020-08-19 DIAGNOSIS — I10 Essential (primary) hypertension: Secondary | ICD-10-CM | POA: Diagnosis not present

## 2020-08-19 DIAGNOSIS — M5002 Cervical disc disorder with myelopathy, mid-cervical region, unspecified level: Secondary | ICD-10-CM | POA: Diagnosis not present

## 2020-08-19 DIAGNOSIS — E119 Type 2 diabetes mellitus without complications: Secondary | ICD-10-CM | POA: Diagnosis not present

## 2020-08-19 DIAGNOSIS — E785 Hyperlipidemia, unspecified: Secondary | ICD-10-CM | POA: Diagnosis not present

## 2020-08-19 DIAGNOSIS — S83242D Other tear of medial meniscus, current injury, left knee, subsequent encounter: Secondary | ICD-10-CM | POA: Diagnosis not present

## 2020-08-19 DIAGNOSIS — S83282D Other tear of lateral meniscus, current injury, left knee, subsequent encounter: Secondary | ICD-10-CM | POA: Diagnosis not present

## 2020-08-19 DIAGNOSIS — S76012D Strain of muscle, fascia and tendon of left hip, subsequent encounter: Secondary | ICD-10-CM | POA: Diagnosis not present

## 2020-08-19 DIAGNOSIS — E039 Hypothyroidism, unspecified: Secondary | ICD-10-CM | POA: Diagnosis not present

## 2020-08-24 DIAGNOSIS — E119 Type 2 diabetes mellitus without complications: Secondary | ICD-10-CM | POA: Diagnosis not present

## 2020-08-24 DIAGNOSIS — I10 Essential (primary) hypertension: Secondary | ICD-10-CM | POA: Diagnosis not present

## 2020-08-24 DIAGNOSIS — M5002 Cervical disc disorder with myelopathy, mid-cervical region, unspecified level: Secondary | ICD-10-CM | POA: Diagnosis not present

## 2020-08-24 DIAGNOSIS — D509 Iron deficiency anemia, unspecified: Secondary | ICD-10-CM | POA: Diagnosis not present

## 2020-08-24 DIAGNOSIS — S83282D Other tear of lateral meniscus, current injury, left knee, subsequent encounter: Secondary | ICD-10-CM | POA: Diagnosis not present

## 2020-08-24 DIAGNOSIS — E039 Hypothyroidism, unspecified: Secondary | ICD-10-CM | POA: Diagnosis not present

## 2020-08-24 DIAGNOSIS — E785 Hyperlipidemia, unspecified: Secondary | ICD-10-CM | POA: Diagnosis not present

## 2020-08-24 DIAGNOSIS — S83242D Other tear of medial meniscus, current injury, left knee, subsequent encounter: Secondary | ICD-10-CM | POA: Diagnosis not present

## 2020-08-24 DIAGNOSIS — S76012D Strain of muscle, fascia and tendon of left hip, subsequent encounter: Secondary | ICD-10-CM | POA: Diagnosis not present

## 2020-08-26 DIAGNOSIS — I10 Essential (primary) hypertension: Secondary | ICD-10-CM | POA: Diagnosis not present

## 2020-08-26 DIAGNOSIS — E119 Type 2 diabetes mellitus without complications: Secondary | ICD-10-CM | POA: Diagnosis not present

## 2020-08-26 DIAGNOSIS — E039 Hypothyroidism, unspecified: Secondary | ICD-10-CM | POA: Diagnosis not present

## 2020-08-26 DIAGNOSIS — S83242D Other tear of medial meniscus, current injury, left knee, subsequent encounter: Secondary | ICD-10-CM | POA: Diagnosis not present

## 2020-08-26 DIAGNOSIS — S76012D Strain of muscle, fascia and tendon of left hip, subsequent encounter: Secondary | ICD-10-CM | POA: Diagnosis not present

## 2020-08-26 DIAGNOSIS — M5002 Cervical disc disorder with myelopathy, mid-cervical region, unspecified level: Secondary | ICD-10-CM | POA: Diagnosis not present

## 2020-08-26 DIAGNOSIS — D509 Iron deficiency anemia, unspecified: Secondary | ICD-10-CM | POA: Diagnosis not present

## 2020-08-26 DIAGNOSIS — E785 Hyperlipidemia, unspecified: Secondary | ICD-10-CM | POA: Diagnosis not present

## 2020-08-26 DIAGNOSIS — S83282D Other tear of lateral meniscus, current injury, left knee, subsequent encounter: Secondary | ICD-10-CM | POA: Diagnosis not present

## 2020-08-30 DIAGNOSIS — M25562 Pain in left knee: Secondary | ICD-10-CM | POA: Diagnosis not present

## 2020-08-31 DIAGNOSIS — D509 Iron deficiency anemia, unspecified: Secondary | ICD-10-CM | POA: Diagnosis not present

## 2020-08-31 DIAGNOSIS — S76012D Strain of muscle, fascia and tendon of left hip, subsequent encounter: Secondary | ICD-10-CM | POA: Diagnosis not present

## 2020-08-31 DIAGNOSIS — M5002 Cervical disc disorder with myelopathy, mid-cervical region, unspecified level: Secondary | ICD-10-CM | POA: Diagnosis not present

## 2020-08-31 DIAGNOSIS — S83242D Other tear of medial meniscus, current injury, left knee, subsequent encounter: Secondary | ICD-10-CM | POA: Diagnosis not present

## 2020-08-31 DIAGNOSIS — E785 Hyperlipidemia, unspecified: Secondary | ICD-10-CM | POA: Diagnosis not present

## 2020-08-31 DIAGNOSIS — E039 Hypothyroidism, unspecified: Secondary | ICD-10-CM | POA: Diagnosis not present

## 2020-08-31 DIAGNOSIS — I10 Essential (primary) hypertension: Secondary | ICD-10-CM | POA: Diagnosis not present

## 2020-08-31 DIAGNOSIS — S83282D Other tear of lateral meniscus, current injury, left knee, subsequent encounter: Secondary | ICD-10-CM | POA: Diagnosis not present

## 2020-08-31 DIAGNOSIS — E119 Type 2 diabetes mellitus without complications: Secondary | ICD-10-CM | POA: Diagnosis not present

## 2020-09-07 DIAGNOSIS — S83282D Other tear of lateral meniscus, current injury, left knee, subsequent encounter: Secondary | ICD-10-CM | POA: Diagnosis not present

## 2020-09-07 DIAGNOSIS — E785 Hyperlipidemia, unspecified: Secondary | ICD-10-CM | POA: Diagnosis not present

## 2020-09-07 DIAGNOSIS — E039 Hypothyroidism, unspecified: Secondary | ICD-10-CM | POA: Diagnosis not present

## 2020-09-07 DIAGNOSIS — S83242D Other tear of medial meniscus, current injury, left knee, subsequent encounter: Secondary | ICD-10-CM | POA: Diagnosis not present

## 2020-09-07 DIAGNOSIS — M5002 Cervical disc disorder with myelopathy, mid-cervical region, unspecified level: Secondary | ICD-10-CM | POA: Diagnosis not present

## 2020-09-07 DIAGNOSIS — I10 Essential (primary) hypertension: Secondary | ICD-10-CM | POA: Diagnosis not present

## 2020-09-07 DIAGNOSIS — S76012D Strain of muscle, fascia and tendon of left hip, subsequent encounter: Secondary | ICD-10-CM | POA: Diagnosis not present

## 2020-09-07 DIAGNOSIS — E119 Type 2 diabetes mellitus without complications: Secondary | ICD-10-CM | POA: Diagnosis not present

## 2020-09-07 DIAGNOSIS — D509 Iron deficiency anemia, unspecified: Secondary | ICD-10-CM | POA: Diagnosis not present

## 2020-09-15 DIAGNOSIS — S83207A Unspecified tear of unspecified meniscus, current injury, left knee, initial encounter: Secondary | ICD-10-CM | POA: Diagnosis not present

## 2020-10-05 DIAGNOSIS — M7052 Other bursitis of knee, left knee: Secondary | ICD-10-CM | POA: Diagnosis not present

## 2020-10-18 DIAGNOSIS — M7052 Other bursitis of knee, left knee: Secondary | ICD-10-CM | POA: Diagnosis not present

## 2020-11-15 DIAGNOSIS — S83207D Unspecified tear of unspecified meniscus, current injury, left knee, subsequent encounter: Secondary | ICD-10-CM | POA: Diagnosis not present

## 2020-11-15 DIAGNOSIS — M7052 Other bursitis of knee, left knee: Secondary | ICD-10-CM | POA: Diagnosis not present

## 2020-12-03 DIAGNOSIS — M810 Age-related osteoporosis without current pathological fracture: Secondary | ICD-10-CM | POA: Diagnosis not present

## 2020-12-03 DIAGNOSIS — E119 Type 2 diabetes mellitus without complications: Secondary | ICD-10-CM | POA: Diagnosis not present

## 2020-12-03 DIAGNOSIS — E785 Hyperlipidemia, unspecified: Secondary | ICD-10-CM | POA: Diagnosis not present

## 2020-12-03 DIAGNOSIS — E039 Hypothyroidism, unspecified: Secondary | ICD-10-CM | POA: Diagnosis not present

## 2020-12-15 DIAGNOSIS — R82998 Other abnormal findings in urine: Secondary | ICD-10-CM | POA: Diagnosis not present

## 2020-12-16 DIAGNOSIS — F419 Anxiety disorder, unspecified: Secondary | ICD-10-CM | POA: Diagnosis not present

## 2020-12-16 DIAGNOSIS — I10 Essential (primary) hypertension: Secondary | ICD-10-CM | POA: Diagnosis not present

## 2020-12-16 DIAGNOSIS — E119 Type 2 diabetes mellitus without complications: Secondary | ICD-10-CM | POA: Diagnosis not present

## 2020-12-16 DIAGNOSIS — Z1331 Encounter for screening for depression: Secondary | ICD-10-CM | POA: Diagnosis not present

## 2020-12-16 DIAGNOSIS — Z1339 Encounter for screening examination for other mental health and behavioral disorders: Secondary | ICD-10-CM | POA: Diagnosis not present

## 2020-12-16 DIAGNOSIS — R627 Adult failure to thrive: Secondary | ICD-10-CM | POA: Diagnosis not present

## 2020-12-16 DIAGNOSIS — E785 Hyperlipidemia, unspecified: Secondary | ICD-10-CM | POA: Diagnosis not present

## 2020-12-16 DIAGNOSIS — Z0000 Encounter for general adult medical examination without abnormal findings: Secondary | ICD-10-CM | POA: Diagnosis not present

## 2020-12-16 DIAGNOSIS — F329 Major depressive disorder, single episode, unspecified: Secondary | ICD-10-CM | POA: Diagnosis not present

## 2020-12-16 DIAGNOSIS — I6990 Unspecified sequelae of unspecified cerebrovascular disease: Secondary | ICD-10-CM | POA: Diagnosis not present

## 2020-12-16 DIAGNOSIS — E039 Hypothyroidism, unspecified: Secondary | ICD-10-CM | POA: Diagnosis not present

## 2020-12-30 DIAGNOSIS — M7052 Other bursitis of knee, left knee: Secondary | ICD-10-CM | POA: Diagnosis not present

## 2021-06-14 DIAGNOSIS — E119 Type 2 diabetes mellitus without complications: Secondary | ICD-10-CM | POA: Diagnosis not present

## 2021-06-14 DIAGNOSIS — F329 Major depressive disorder, single episode, unspecified: Secondary | ICD-10-CM | POA: Diagnosis not present

## 2021-06-14 DIAGNOSIS — F1320 Sedative, hypnotic or anxiolytic dependence, uncomplicated: Secondary | ICD-10-CM | POA: Diagnosis not present

## 2021-06-14 DIAGNOSIS — I10 Essential (primary) hypertension: Secondary | ICD-10-CM | POA: Diagnosis not present

## 2021-06-14 DIAGNOSIS — Z23 Encounter for immunization: Secondary | ICD-10-CM | POA: Diagnosis not present

## 2021-06-14 DIAGNOSIS — D509 Iron deficiency anemia, unspecified: Secondary | ICD-10-CM | POA: Diagnosis not present

## 2021-06-14 DIAGNOSIS — M810 Age-related osteoporosis without current pathological fracture: Secondary | ICD-10-CM | POA: Diagnosis not present

## 2021-06-14 DIAGNOSIS — E039 Hypothyroidism, unspecified: Secondary | ICD-10-CM | POA: Diagnosis not present

## 2021-06-14 DIAGNOSIS — E785 Hyperlipidemia, unspecified: Secondary | ICD-10-CM | POA: Diagnosis not present

## 2021-06-14 DIAGNOSIS — F419 Anxiety disorder, unspecified: Secondary | ICD-10-CM | POA: Diagnosis not present

## 2021-10-25 IMAGING — DX DG TIBIA/FIBULA 2V*L*
4 series · 4 of 4 positions shown · non-contrast
Comparison: Left hip and femur series tonight.

CLINICAL DATA: 89-year-old female with pain after fall.

EXAM:
LEFT TIBIA AND FIBULA - 2 VIEW

[tibia ap (1 of 2)]
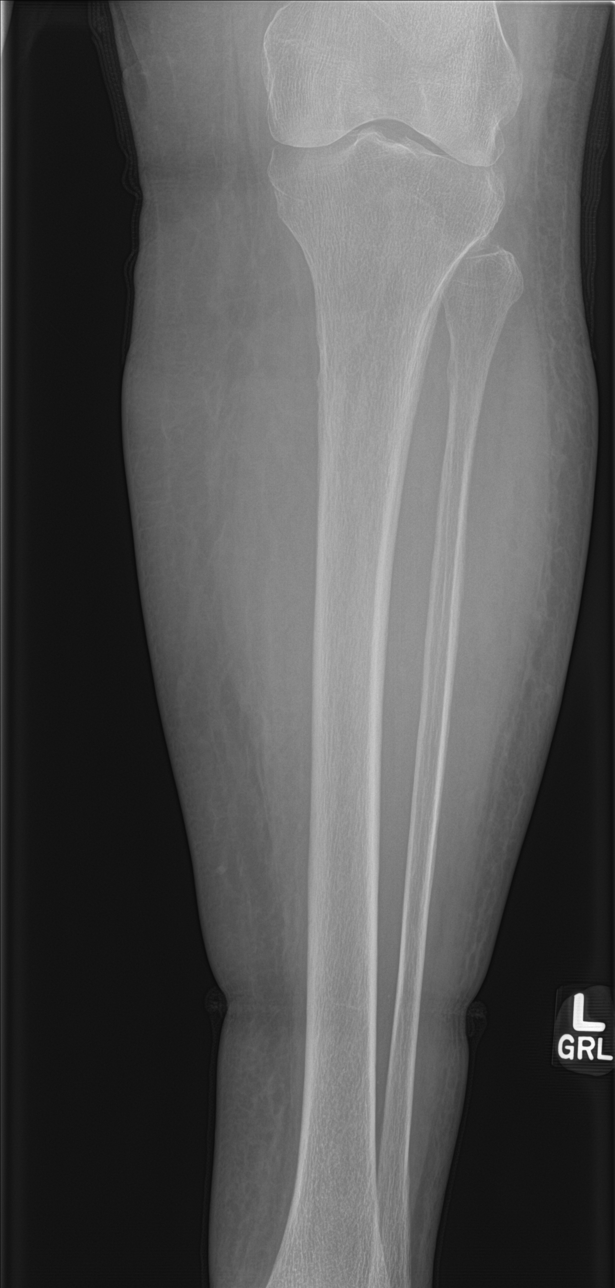

[tibia ap (2 of 2)]
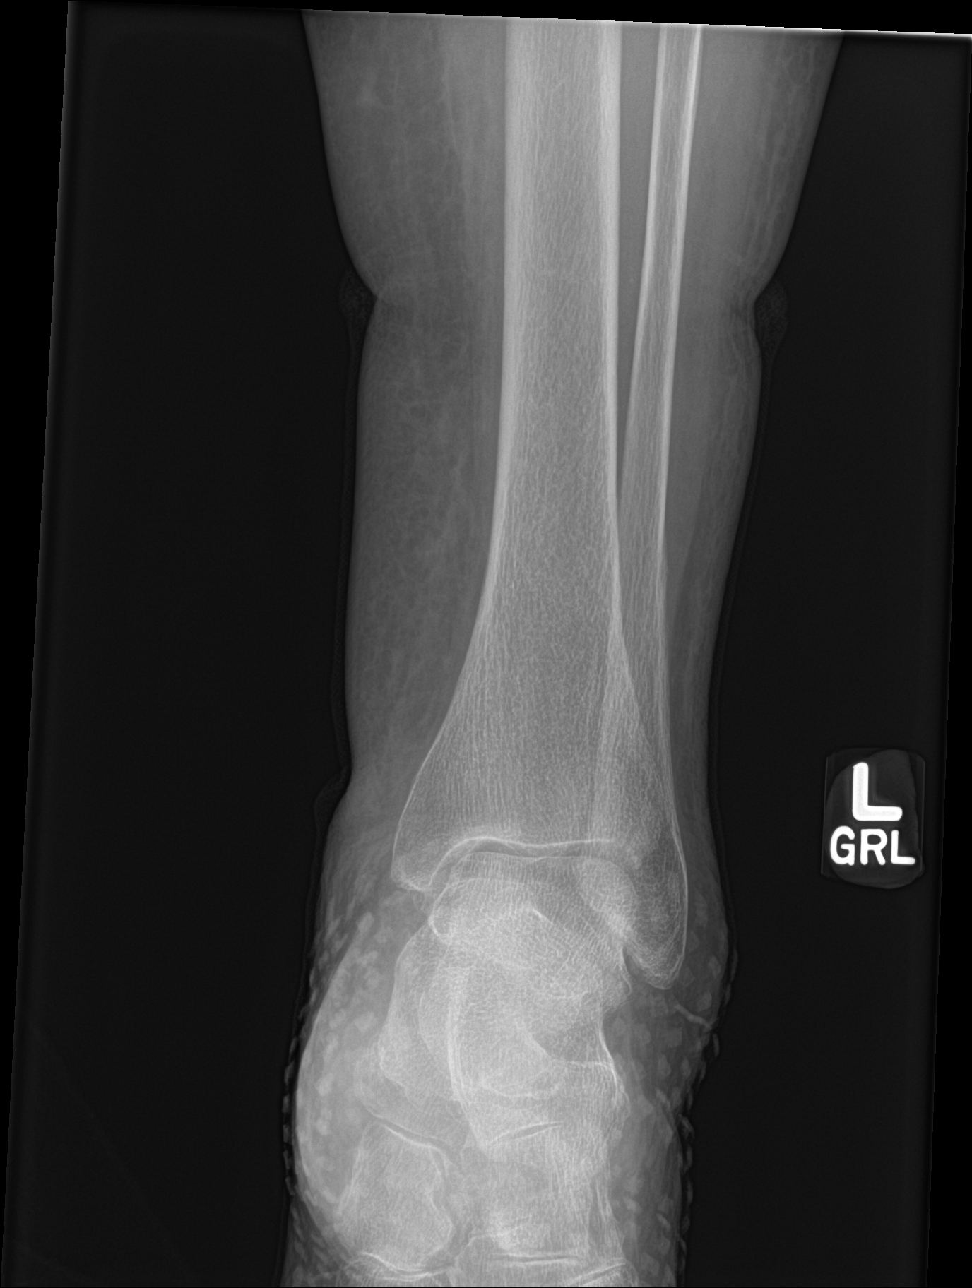

[tibia lat (1 of 2)]
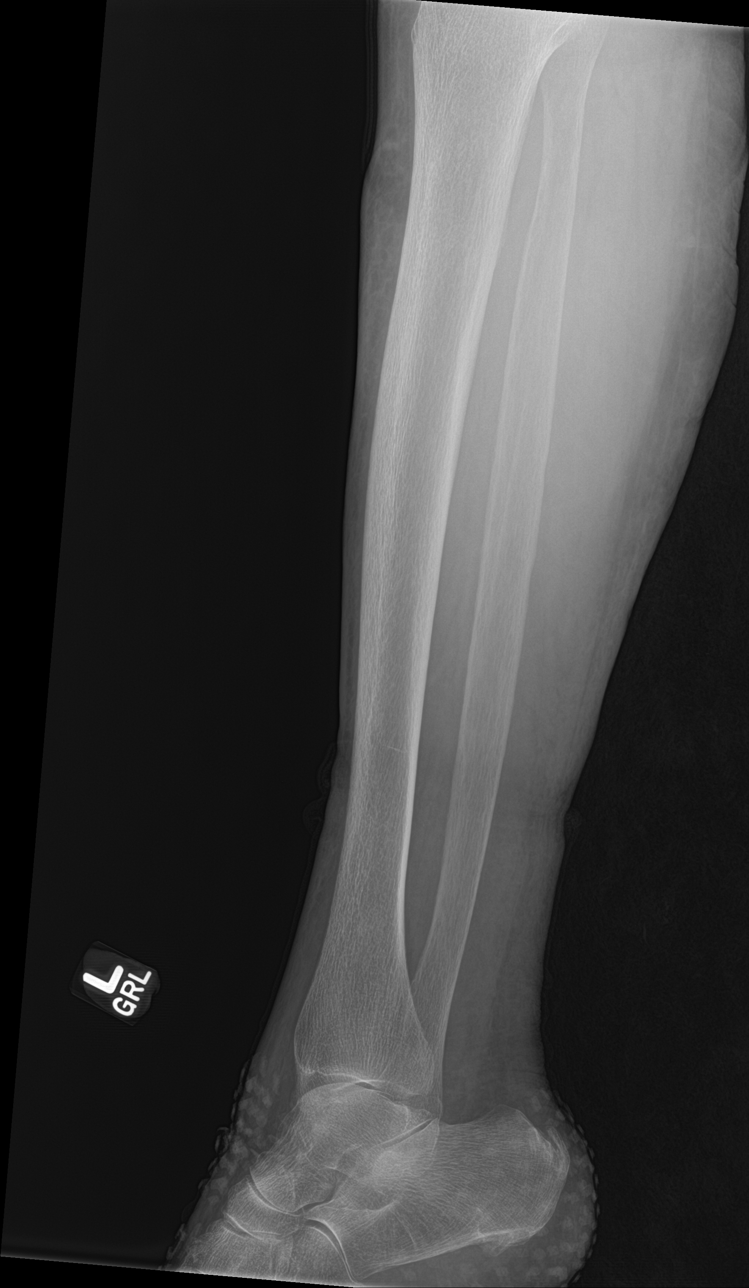

[tibia lat (2 of 2)]
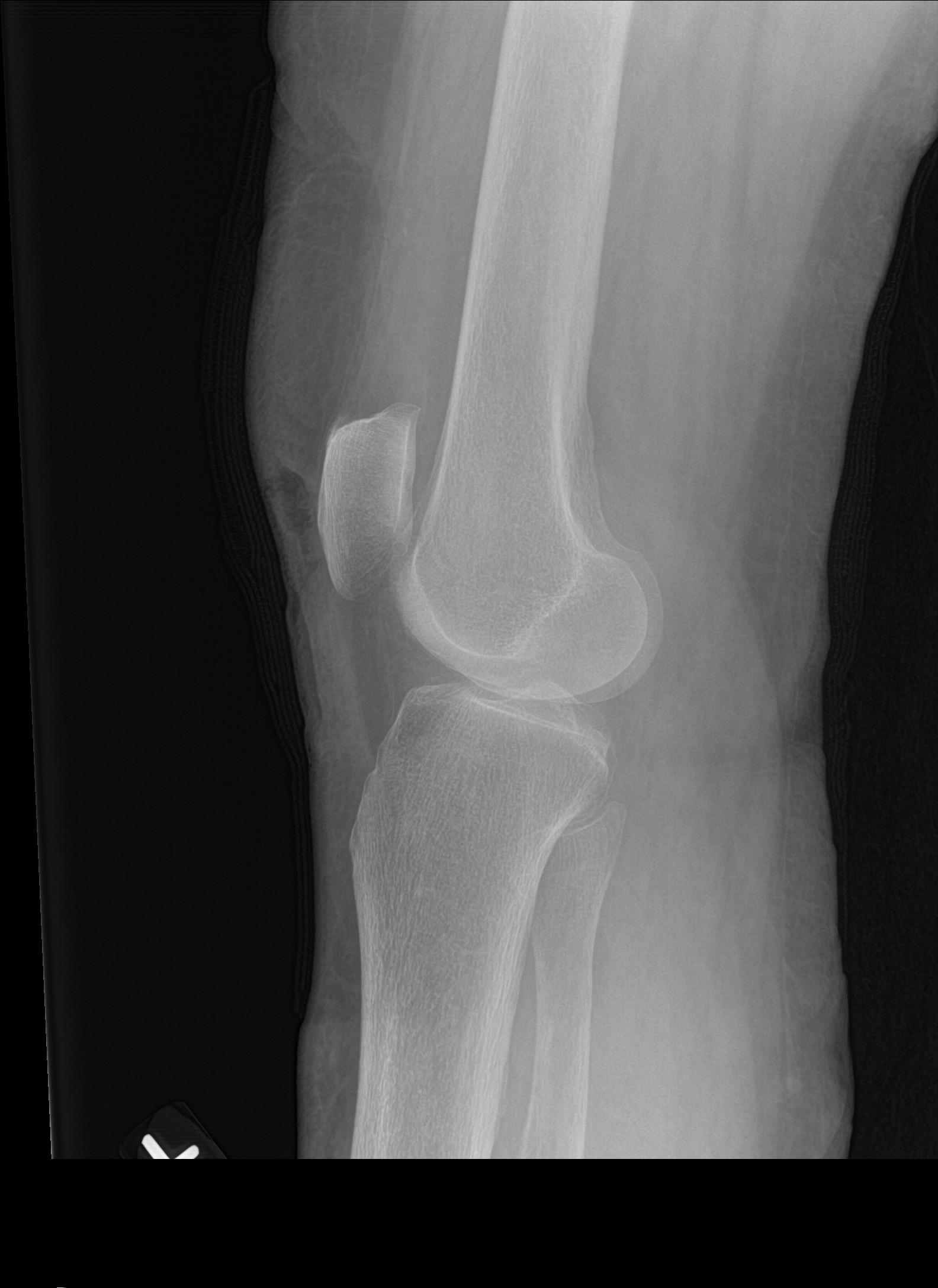

[4 of 4 positions shown; findings below may reference images not displayed]

FINDINGS: These images include lateral views of the distal left femur and
knee. Visible femur intact. Preserved alignment at the left knee. No
evidence of joint effusion.

Anterior soft tissue injury overlying the patella. Patella appears
intact.

Left tibia and fibula appear intact with maintained alignment at the
left ankle. Grossly intact visible calcaneus. No discrete soft
tissue injury.
IMPRESSION: No acute fracture or dislocation identified. Evidence of soft tissue
injury overlying the patella.

## 2021-12-26 DIAGNOSIS — E785 Hyperlipidemia, unspecified: Secondary | ICD-10-CM | POA: Diagnosis not present

## 2021-12-26 DIAGNOSIS — E039 Hypothyroidism, unspecified: Secondary | ICD-10-CM | POA: Diagnosis not present

## 2021-12-26 DIAGNOSIS — D509 Iron deficiency anemia, unspecified: Secondary | ICD-10-CM | POA: Diagnosis not present

## 2021-12-26 DIAGNOSIS — M810 Age-related osteoporosis without current pathological fracture: Secondary | ICD-10-CM | POA: Diagnosis not present

## 2021-12-26 DIAGNOSIS — F419 Anxiety disorder, unspecified: Secondary | ICD-10-CM | POA: Diagnosis not present

## 2021-12-26 DIAGNOSIS — I10 Essential (primary) hypertension: Secondary | ICD-10-CM | POA: Diagnosis not present

## 2021-12-26 DIAGNOSIS — Z0000 Encounter for general adult medical examination without abnormal findings: Secondary | ICD-10-CM | POA: Diagnosis not present

## 2021-12-26 DIAGNOSIS — F329 Major depressive disorder, single episode, unspecified: Secondary | ICD-10-CM | POA: Diagnosis not present

## 2021-12-26 DIAGNOSIS — F1320 Sedative, hypnotic or anxiolytic dependence, uncomplicated: Secondary | ICD-10-CM | POA: Diagnosis not present

## 2021-12-26 DIAGNOSIS — E559 Vitamin D deficiency, unspecified: Secondary | ICD-10-CM | POA: Diagnosis not present

## 2021-12-26 DIAGNOSIS — E119 Type 2 diabetes mellitus without complications: Secondary | ICD-10-CM | POA: Diagnosis not present

## 2022-07-04 DIAGNOSIS — E559 Vitamin D deficiency, unspecified: Secondary | ICD-10-CM | POA: Diagnosis not present

## 2022-07-04 DIAGNOSIS — D509 Iron deficiency anemia, unspecified: Secondary | ICD-10-CM | POA: Diagnosis not present

## 2022-07-04 DIAGNOSIS — E039 Hypothyroidism, unspecified: Secondary | ICD-10-CM | POA: Diagnosis not present

## 2022-07-04 DIAGNOSIS — F419 Anxiety disorder, unspecified: Secondary | ICD-10-CM | POA: Diagnosis not present

## 2022-07-04 DIAGNOSIS — Z23 Encounter for immunization: Secondary | ICD-10-CM | POA: Diagnosis not present

## 2022-07-04 DIAGNOSIS — I10 Essential (primary) hypertension: Secondary | ICD-10-CM | POA: Diagnosis not present

## 2022-07-04 DIAGNOSIS — E119 Type 2 diabetes mellitus without complications: Secondary | ICD-10-CM | POA: Diagnosis not present

## 2022-07-04 DIAGNOSIS — M810 Age-related osteoporosis without current pathological fracture: Secondary | ICD-10-CM | POA: Diagnosis not present

## 2022-07-04 DIAGNOSIS — E785 Hyperlipidemia, unspecified: Secondary | ICD-10-CM | POA: Diagnosis not present

## 2022-07-04 DIAGNOSIS — I6990 Unspecified sequelae of unspecified cerebrovascular disease: Secondary | ICD-10-CM | POA: Diagnosis not present

## 2022-07-04 DIAGNOSIS — F1320 Sedative, hypnotic or anxiolytic dependence, uncomplicated: Secondary | ICD-10-CM | POA: Diagnosis not present

## 2022-07-04 DIAGNOSIS — F329 Major depressive disorder, single episode, unspecified: Secondary | ICD-10-CM | POA: Diagnosis not present

## 2022-07-04 DIAGNOSIS — M1990 Unspecified osteoarthritis, unspecified site: Secondary | ICD-10-CM | POA: Diagnosis not present

## 2022-07-05 DIAGNOSIS — D485 Neoplasm of uncertain behavior of skin: Secondary | ICD-10-CM | POA: Diagnosis not present

## 2022-07-05 DIAGNOSIS — B079 Viral wart, unspecified: Secondary | ICD-10-CM | POA: Diagnosis not present

## 2022-08-16 DIAGNOSIS — D509 Iron deficiency anemia, unspecified: Secondary | ICD-10-CM | POA: Diagnosis not present

## 2022-08-16 DIAGNOSIS — E785 Hyperlipidemia, unspecified: Secondary | ICD-10-CM | POA: Diagnosis not present

## 2022-10-06 DIAGNOSIS — E785 Hyperlipidemia, unspecified: Secondary | ICD-10-CM | POA: Diagnosis not present

## 2022-11-03 DIAGNOSIS — E785 Hyperlipidemia, unspecified: Secondary | ICD-10-CM | POA: Diagnosis not present

## 2022-11-03 DIAGNOSIS — I10 Essential (primary) hypertension: Secondary | ICD-10-CM | POA: Diagnosis not present

## 2022-11-04 ENCOUNTER — Encounter (HOSPITAL_BASED_OUTPATIENT_CLINIC_OR_DEPARTMENT_OTHER): Payer: Self-pay

## 2022-11-04 ENCOUNTER — Emergency Department (HOSPITAL_BASED_OUTPATIENT_CLINIC_OR_DEPARTMENT_OTHER)
Admission: EM | Admit: 2022-11-04 | Discharge: 2022-11-04 | Disposition: A | Discharge status: Home / Self Care | Payer: Medicare HMO | Attending: Emergency Medicine | Admitting: Emergency Medicine

## 2022-11-04 ENCOUNTER — Other Ambulatory Visit: Payer: Self-pay

## 2022-11-04 DIAGNOSIS — W57XXXA Bitten or stung by nonvenomous insect and other nonvenomous arthropods, initial encounter: Secondary | ICD-10-CM | POA: Insufficient documentation

## 2022-11-04 DIAGNOSIS — S20361A Insect bite (nonvenomous) of right front wall of thorax, initial encounter: Secondary | ICD-10-CM | POA: Diagnosis not present

## 2022-11-04 DIAGNOSIS — S20301A Unspecified superficial injuries of right front wall of thorax, initial encounter: Secondary | ICD-10-CM | POA: Diagnosis present

## 2022-11-04 DIAGNOSIS — S40861A Insect bite (nonvenomous) of right upper arm, initial encounter: Secondary | ICD-10-CM | POA: Diagnosis not present

## 2022-11-04 MED ORDER — DOXYCYCLINE HYCLATE 100 MG PO CAPS: 100.0000 mg | ORAL_CAPSULE | Freq: Two times a day (BID) | ORAL | 0 refills | Status: DC

## 2022-11-04 NOTE — ED Triage Notes (Signed)
Patient here POV from Home.  Endorses having a Tick to Chest.  NAD Noted during Triage. A&Ox4. GCS 15. Ambulatory.

## 2022-11-04 NOTE — ED Provider Notes (Signed)
[facility] Provider Note   CSN: [id number] Arrival date & time: 11/04/22  1853     History  Chief Complaint  Patient presents with   Tick Removal    [name] is a 89 y.o. female who presents emergency department after embedded tick bite.  Tick bite to the chest that was found on her chest wall.  It was removed today however there is a small piece of the pins are still embedded in her right chest wall.  She is unable to remove this and came here for further evaluation.  She notes some redness and tenderness around the bite region but denies fevers or rashes.  She thinks that it must of bitten her yesterday.  It was fully embedded in the skin.  HPI     Home Medications Prior to Admission medications   Medication Sig Start Date End Date Taking? Authorizing Provider  doxycycline (VIBRAMYCIN) 100 MG capsule Take 1 capsule (100 mg total) by mouth 2 (two) times daily. One po bid x 7 days 11/04/22  Yes Ronniesha Seibold, PA-C  albuterol (PROVENTIL HFA;VENTOLIN HFA) 108 (90 Base) MCG/ACT inhaler Inhale 1-2 puffs into the lungs every 6 (six) hours as needed for wheezing or shortness of breath. 12/05/17   Jola Schmidt, MD  ALPRAZolam Duanne Moron) 0.5 MG tablet Take 0.5 mg by mouth 3 (three) times daily as needed. For anxiety    [provider]  amLODipine (NORVASC) 2.5 MG tablet Take 10 mg by mouth daily.     [provider]  atorvastatin (LIPITOR) 20 MG tablet Take 20 mg by mouth daily.    [provider]  betamethasone dipropionate (DIPROLENE) 0.05 % cream Apply 1 application topically 2 (two) times daily.    [provider]  Diclofenac Sodium CR 100 MG 24 hr tablet Take 1 tablet (100 mg total) by mouth daily. 06/29/20   Palumbo, April, MD  ferrous sulfate 325 (65 FE) MG EC tablet Take 1 tablet (325 mg total) by mouth daily with breakfast. 12/04/17   Shelly Coss, MD  levothyroxine (SYNTHROID, LEVOTHROID) 150 MCG  tablet Take 150 mcg by mouth daily.    [provider]  lidocaine (LIDODERM) 5 % Place 1 patch onto the skin daily. Remove & Discard patch within 12 hours or as directed by MD 06/29/20   Randal Buba, April, MD  olmesartan-hydrochlorothiazide (BENICAR HCT) 40-25 MG tablet Take 1 tablet by mouth daily. 10/15/17   [provider]  pantoprazole (PROTONIX) 40 MG tablet Take 1 tablet (40 mg total) by mouth daily. 12/04/17 01/03/18  Shelly Coss, MD  potassium chloride SA (K-DUR,KLOR-CON) 20 MEQ tablet Take 20 mEq by mouth daily.    [provider]  traZODone (DESYREL) 50 MG tablet Take 50 mg by mouth at bedtime. 11/20/17   [provider]      Allergies    Prednisone    Review of Systems   Review of Systems  Physical Exam Updated Vital Signs BP (!) 163/75 (BP Location: Right Arm)   Pulse (!) 108   Temp 98 F (36.7 C)   Resp 17   Ht 5' (1.524 m)   Wt 68.9 kg   SpO2 97%   BMI 29.69 kg/m  Physical Exam Vitals and nursing note reviewed.  Constitutional:      General: She is not in acute distress.    Appearance: She is well-developed. She is not diaphoretic.  HENT:     Head: Normocephalic and atraumatic.  Right Ear: External ear normal.     Left Ear: External ear normal.     Nose: Nose normal.     Mouth/Throat:     Mouth: Mucous membranes are moist.  Eyes:     General: No scleral icterus.    Conjunctiva/sclera: Conjunctivae normal.  Cardiovascular:     Rate and Rhythm: Normal rate and regular rhythm.     Heart sounds: Normal heart sounds. No murmur heard.    No friction rub. No gallop.  Pulmonary:     Effort: Pulmonary effort is normal. No respiratory distress.     Breath sounds: Normal breath sounds.  Abdominal:     General: Bowel sounds are normal. There is no distension.     Palpations: Abdomen is soft. There is no mass.     Tenderness: There is no abdominal tenderness. There is no guarding.  Musculoskeletal:     Cervical back: Normal  range of motion.  Skin:    General: Skin is warm and dry.     Comments: Small erythematous tender area in the right upper chest wall there is piece of embedded brown material likely the pincer of the tick.  No purulence, no lymphangitis or surrounding cellulitis.  Neurological:     Mental Status: She is alert and oriented to person, place, and time.  Psychiatric:        Behavior: Behavior normal.     ED Results / Procedures / Treatments   Labs (all labs ordered are listed, but only abnormal results are displayed) Labs Reviewed - No data to display  EKG None  Radiology No results found.  Procedures Procedures    Medications Ordered in ED Medications - No data to display  ED Course/ Medical Decision Making/ A&P                             Medical Decision Making Patient with embedded tick now removed with portion of the tick still embedded in the skin.  She does have some surrounding tenderness and redness but no evidence of cellulitis.  Will treat with oral doxycycline.  She may apply Neosporin to the affected area.  Discussed outpatient follow-up and return precautions.  She appears otherwise appropriate for discharge           Final Clinical Impression(s) / ED Diagnoses Final diagnoses:  Tick bite of right front wall of thorax, initial encounter    Rx / DC Orders ED Discharge Orders          Ordered    doxycycline (VIBRAMYCIN) 100 MG capsule  2 times daily        11/04/22 2015              Margarita Mail, PA-C 11/04/22 2024    Davonna Belling, MD 11/05/22 1435

## 2022-11-04 NOTE — Discharge Instructions (Signed)
Get help right away if: You are not able to remove a tick. You have muscle weakness or paralysis. Your symptoms get worse or you experience new symptoms. You find an engorged tick on your skin and you are in an area where there is a higher risk of disease from ticks.

## 2022-12-22 DIAGNOSIS — Z862 Personal history of diseases of the blood and blood-forming organs and certain disorders involving the immune mechanism: Secondary | ICD-10-CM | POA: Diagnosis not present

## 2022-12-22 DIAGNOSIS — Z8719 Personal history of other diseases of the digestive system: Secondary | ICD-10-CM | POA: Diagnosis not present

## 2022-12-22 DIAGNOSIS — R7989 Other specified abnormal findings of blood chemistry: Secondary | ICD-10-CM | POA: Diagnosis not present

## 2022-12-22 DIAGNOSIS — K625 Hemorrhage of anus and rectum: Secondary | ICD-10-CM | POA: Diagnosis not present

## 2022-12-22 DIAGNOSIS — D509 Iron deficiency anemia, unspecified: Secondary | ICD-10-CM | POA: Diagnosis not present

## 2022-12-22 DIAGNOSIS — E785 Hyperlipidemia, unspecified: Secondary | ICD-10-CM | POA: Diagnosis not present

## 2022-12-22 DIAGNOSIS — I10 Essential (primary) hypertension: Secondary | ICD-10-CM | POA: Diagnosis not present

## 2022-12-22 DIAGNOSIS — K5900 Constipation, unspecified: Secondary | ICD-10-CM | POA: Diagnosis not present

## 2023-01-15 ENCOUNTER — Other Ambulatory Visit (INDEPENDENT_AMBULATORY_CARE_PROVIDER_SITE_OTHER): Payer: Medicare PPO

## 2023-01-15 ENCOUNTER — Encounter: Payer: Self-pay | Admitting: Nurse Practitioner

## 2023-01-15 ENCOUNTER — Ambulatory Visit: Payer: Medicare PPO | Admitting: Nurse Practitioner

## 2023-01-15 VITALS — BP 130/68 | HR 94 | Ht 60.0 in | Wt 147.0 lb

## 2023-01-15 DIAGNOSIS — K625 Hemorrhage of anus and rectum: Secondary | ICD-10-CM

## 2023-01-15 DIAGNOSIS — K5641 Fecal impaction: Secondary | ICD-10-CM | POA: Diagnosis not present

## 2023-01-15 LAB — CBC WITH DIFFERENTIAL/PLATELET
Basophils Absolute: 0.1 10*3/uL (ref 0.0–0.1)
Basophils Relative: 1 % (ref 0.0–3.0)
Eosinophils Absolute: 0.1 10*3/uL (ref 0.0–0.7)
Eosinophils Relative: 1.8 % (ref 0.0–5.0)
HCT: 36.4 % (ref 36.0–46.0)
Hemoglobin: 12.2 g/dL (ref 12.0–15.0)
Lymphocytes Relative: 33.5 % (ref 12.0–46.0)
Lymphs Abs: 2.7 10*3/uL (ref 0.7–4.0)
MCHC: 33.6 g/dL (ref 30.0–36.0)
MCV: 78.8 fl (ref 78.0–100.0)
Monocytes Absolute: 0.7 10*3/uL (ref 0.1–1.0)
Monocytes Relative: 8.6 % (ref 3.0–12.0)
Neutro Abs: 4.4 10*3/uL (ref 1.4–7.7)
Neutrophils Relative %: 55.1 % (ref 43.0–77.0)
Platelets: 299 10*3/uL (ref 150.0–400.0)
RBC: 4.62 Mil/uL (ref 3.87–5.11)
RDW: 14.8 % (ref 11.5–15.5)
WBC: 8 10*3/uL (ref 4.0–10.5)

## 2023-01-15 NOTE — Progress Notes (Signed)
Assessment / Plan   Primary GI:  Claudette Head, MD  89 y.o. yo female with a past medical history consisting of, but not limited to iron deficiency anemia, small bowel AVMs, colon AVM, adenomatous colon polyps, DM2, hypertension, hypothyroidism  Intermittent rectal bleeding, ? Maybe for 2-3 months. Also recent bowel changes with mushy to loose stool.  She could be having perirectal bleeding from constipation/fecal impaction found on DRE today.  However, she has a history of colon AVMs and also colon polyps including a large rectal polyp removed in 2019 so other etiologies of bleeding not excluded.  -Will purge bowels with Dulcolax suppositories and one half of MiraLAX prep today.  -CBC -I will ask my nurse to call patient in the next day or 2 with a condition update.  -Depending on clinical course she may need a bowel regimen going forward.  -Hopefully bowel changes and rectal bleeding will resolve with above as patient is very reluctant to undergo another colonoscopy  History of adenomatous colon polyps.  Due to her age she was taken out of the polyp surveillance program following her last colonoscopy in 2019  History of Present Illness   Chief Complaint: Rectal bleeding  Patient referred by her PCP for rectal bleeding and anemia.  Patient is not sure exactly how long she has been having rectal bleeding, thinks maybe for about 2 to 3 months.  Her hemoglobin in late January 2024 was 12.6, I do not see any more recent labs in the records received from PCP. Family member accompanies patient and says that she has not had labs in a couple of months.  PCP stopped her oral iron several months ago.  She is passing scant amounts of red blood when she has a bowel movement but also with flatus.  She has some minor rectal discomfort when sitting for prolonged periods of time.  She reports a change in bowel habits over the last couple of months.  Stools have been mushy to loose in consistency.   Prior to few months ago her stools were soft formed.  It seems like the bowel changes did start around the time she started having some rectal bleeding.  She had antibiotics in February for tick bite.   Previous Endoscopies / Labs /  Imaging   March 2019 colonoscopy for rectal bleeding -- One 5 mm polyp in the transverse colon, removed with a cold snare. Resected and retrieved. - One 15 mm polyp in the very distal rectum, removed with a hot snare. Resected and retrieved. - One non-bleeding colonic angiodysplastic lesion. Treated with argon plasma coagulation (APC) for destruction, the lesion bled slightly during treatment. - The examination was otherwise normal on direct and retroflexion views. - The cecal AVM and/or the medium sized rectal polyp may have contributed to her anemia. The rectal polyp almost certainly explains her minor rectal bleeding   1. Colon, polyp(s), Transverse - TUBULAR ADENOMA. - NO HIGH GRADE DYSPLASIA OR MALIGNANCY. 2. Rectum, polyp(s) - TUBULAR ADENOMA. - NO HIGH GRADE DYSPLASIA OR MALIGNANCY.  March 2019 EGD - One small duodenal bulb AVM was found and destroyed with APC applicaiton. - Small hiatal hernia without associated erosions. - The duodenal AVM may have contributed to her anemia.   Past Medical History:  Diagnosis Date   Anemia    Anxiety    Colon polyps    DDD (degenerative disc disease), cervical    Diabetes mellitus    Diabetes mellitus, type 2 (HCC)  Hyperlipidemia    Hypertension    Hypothyroidism    Leukemia (HCC) in remission   Mucous retention cyst of maxillary sinus    Osteoporosis    Pinched nerve In neck   Plantar fasciitis    Stroke [facility])    Vitamin D deficiency    Past Surgical History:  Procedure Laterality Date   APPENDECTOMY     CHOLECYSTECTOMY     2004   COLONOSCOPY N/A 02/24/2013   Procedure: COLONOSCOPY;  Surgeon: Meryl Dare, MD;  Location: [facility] ENDOSCOPY;  Service: Endoscopy;  Laterality: N/A;   COLONOSCOPY W/  POLYPECTOMY     COLONOSCOPY WITH PROPOFOL N/A 12/03/2017   Procedure: COLONOSCOPY WITH PROPOFOL;  Surgeon: Rachael Fee, MD;  Location: [facility] ENDOSCOPY;  Service: Gastroenterology;  Laterality: N/A;   ESOPHAGOGASTRODUODENOSCOPY     ESOPHAGOGASTRODUODENOSCOPY N/A 02/24/2013   Procedure: ESOPHAGOGASTRODUODENOSCOPY (EGD);  Surgeon: Meryl Dare, MD;  Location: [facility] ENDOSCOPY;  Service: Endoscopy;  Laterality: N/A;   ESOPHAGOGASTRODUODENOSCOPY (EGD) WITH PROPOFOL N/A 12/03/2017   Procedure: ESOPHAGOGASTRODUODENOSCOPY (EGD) WITH PROPOFOL;  Surgeon: Rachael Fee, MD;  Location: [facility] ENDOSCOPY;  Service: Gastroenterology;  Laterality: N/A;   EYE SURGERY     bilateral catracts   Family History  Problem Relation Age of Onset   Diabetes Other    Colon cancer Son    Esophageal cancer Neg Hx    Stomach cancer Neg Hx    Social History   Tobacco Use   Smoking status: Former   Smokeless tobacco: Never  Building services engineer Use: Former  Substance Use Topics   Alcohol use: No   Drug use: No   Current Outpatient Medications  Medication Sig Dispense Refill   albuterol (PROVENTIL HFA;VENTOLIN HFA) 108 (90 Base) MCG/ACT inhaler Inhale 1-2 puffs into the lungs every 6 (six) hours as needed for wheezing or shortness of breath. 1 Inhaler 0   ALPRAZolam (XANAX) 0.5 MG tablet Take 0.5 mg by mouth 3 (three) times daily as needed. For anxiety     amLODipine (NORVASC) 2.5 MG tablet Take 10 mg by mouth daily.      atorvastatin (LIPITOR) 20 MG tablet Take 20 mg by mouth daily.     cholecalciferol (VITAMIN D3) 25 MCG (1000 UNIT) tablet Take 1,000 Units by mouth daily.     levothyroxine (SYNTHROID, LEVOTHROID) 150 MCG tablet Take 150 mcg by mouth daily.     olmesartan-hydrochlorothiazide (BENICAR HCT) 40-25 MG tablet Take 1 tablet by mouth daily.     pantoprazole (PROTONIX) 40 MG tablet Take 1 tablet (40 mg total) by mouth daily. 30 tablet 0   potassium chloride SA (K-DUR,KLOR-CON) 20 MEQ tablet Take 20 mEq by  mouth daily.     betamethasone dipropionate (DIPROLENE) 0.05 % cream Apply 1 application topically 2 (two) times daily. (Patient not taking: Reported on 01/15/2023)     Diclofenac Sodium CR 100 MG 24 hr tablet Take 1 tablet (100 mg total) by mouth daily. (Patient not taking: Reported on 01/15/2023) 7 tablet 0   lidocaine (LIDODERM) 5 % Place 1 patch onto the skin daily. Remove & Discard patch within 12 hours or as directed by MD (Patient not taking: Reported on 01/15/2023) 30 patch 0   traZODone (DESYREL) 50 MG tablet Take 50 mg by mouth at bedtime. (Patient not taking: Reported on 01/15/2023)     No current facility-administered medications for this visit.   Allergies  Allergen Reactions   Prednisone     Climbs the walls  Review of Systems: Positive for weakness.  All other systems reviewed and negative except where noted in HPI.   Wt Readings from Last 3 Encounters:  01/15/23 147 lb (66.7 kg)  11/04/22 152 lb (68.9 kg)  06/28/20 158 lb (71.7 kg)    Physical Exam:  BP 130/68   Pulse 94   Ht 5' (1.524 m)   Wt 147 lb (66.7 kg)   SpO2 96%   BMI 28.71 kg/m  Constitutional:  Pleasant, generally well appearing female in no acute distress. Psychiatric:  Normal mood and affect. Behavior is normal. EENT: Pupils normal.  Conjunctivae are normal. No scleral icterus. Neck supple.  Cardiovascular: Normal rate, regular rhythm.  Pulmonary/chest: Effort normal and breath sounds normal. No wheezing, rales or rhonchi. Abdominal: Soft, nondistended, nontender. Bowel sounds active throughout. There are no masses palpable. No hepatomegaly. Rectal: No perianal lesions seen.  DRE attempted but there was a fecal impaction Neurological: Alert and oriented to person place and time.  He is here for skin: Skin is warm and dry. No rashes noted.  Willette Cluster, NP  01/15/2023, 11:38 AM I spent 30 minutes total reviewing records, obtaining history, performing exam, counseling patient and documenting visit /  findings.   Cc:  Referring Provider Creola Corn, MD

## 2023-01-15 NOTE — Patient Instructions (Addendum)
We have you scheduled for a follow up with Gunnar Fusi on 04/09/2023.   Below we have the instructions for your Miralax pre  In the morning, mix into a pitcher the entire 119 gram bottle of Miralax with your 64 ounce room temperature Gatorade.    Stir to dissolve and refrigerate.   Mix the entire 119gram bottle of miralax with the 32 oz of Gatorade  stir until dissolved  A. Start drinking the Miralax solution, until 1/2 the liquid is gone.         You will drink one 8 ounce glass every 15 minutes until 1/2 the solution is finished.       Total of 32 ounces (four 8 oz glasses)  C. Drink three or more 8-10 oz  glasses of clear liquids before bedtime.    Your provider has requested that you go to the basement level for lab work before leaving today. Press "B" on the elevator. The lab is located at the first door on the left as you exit the elevator.  Please use dulcolax suppositories today and this evening  _______________________________________________________  If your blood pressure at your visit was 140/90 or greater, please contact your primary care physician to follow up on this.  _______________________________________________________  If you are age 57 or older, your body mass index should be between 23-30. Your Body mass index is 28.71 kg/m. If this is out of the aforementioned range listed, please consider follow up with your Primary Care Provider.  If you are age 22 or younger, your body mass index should be between 19-25. Your Body mass index is 28.71 kg/m. If this is out of the aformentioned range listed, please consider follow up with your Primary Care Provider.   ________________________________________________________  The [place] [facility] providers would like to encourage you to use [facility] to communicate with providers for non-urgent requests or questions.  Due to long hold times on the telephone, sending your provider a message by [facility] may be a faster and more efficient way to  get a response.  Please allow 48 business hours for a response.  Please remember that this is for non-urgent requests.  _______________________________________________________ It was a pleasure to see you today!  Thank you for trusting me with your gastrointestinal care!

## 2023-01-16 ENCOUNTER — Telehealth: Payer: Self-pay

## 2023-01-16 NOTE — Telephone Encounter (Signed)
Inbound call from patient and her son, stating after their call with a nurse. It appears as if a patient now has a knot above her naval. They are requesting to speak with a nurse to discuss further. Patient's son would like [place] to call him.

## 2023-01-16 NOTE — Telephone Encounter (Signed)
The son confirms the success of the purge. She has noted a "bulge" above her belly button. It reduces when she lies down. It feels tender if she pushes on it. She has had bowel movements today with half of the Miralax. 1) son wants to know your thoughts about the bulge 2) son and patient want to know if she is supposed to drink the 2nd half of the prep. Thanks

## 2023-01-16 NOTE — Telephone Encounter (Signed)
Patient calls with update. She used the dulcolax suppositories last night with results. States "had rectal bleeding with that bowel movement." She has consumed half of the Miralax prep this morning as instructed. She has had 1 bowel movement this morning described as a soft movement.  She has not seen anymore blood.  She wants to know what she is to do now. Please advise.

## 2023-01-17 ENCOUNTER — Inpatient Hospital Stay (HOSPITAL_COMMUNITY)
Admission: EM | Admit: 2023-01-17 | Discharge: 2023-01-28 | DRG: 374 | Disposition: A | Discharge status: Home Health | Payer: Medicare PPO | Attending: Internal Medicine | Admitting: Internal Medicine

## 2023-01-17 ENCOUNTER — Other Ambulatory Visit: Payer: Self-pay

## 2023-01-17 ENCOUNTER — Emergency Department (HOSPITAL_COMMUNITY): Payer: Medicare PPO

## 2023-01-17 ENCOUNTER — Telehealth: Payer: Self-pay | Admitting: Gastroenterology

## 2023-01-17 ENCOUNTER — Encounter (HOSPITAL_COMMUNITY): Payer: Self-pay

## 2023-01-17 DIAGNOSIS — D509 Iron deficiency anemia, unspecified: Secondary | ICD-10-CM | POA: Diagnosis present

## 2023-01-17 DIAGNOSIS — R109 Unspecified abdominal pain: Secondary | ICD-10-CM | POA: Diagnosis not present

## 2023-01-17 DIAGNOSIS — R911 Solitary pulmonary nodule: Secondary | ICD-10-CM | POA: Diagnosis not present

## 2023-01-17 DIAGNOSIS — D519 Vitamin B12 deficiency anemia, unspecified: Secondary | ICD-10-CM | POA: Diagnosis present

## 2023-01-17 DIAGNOSIS — D490 Neoplasm of unspecified behavior of digestive system: Secondary | ICD-10-CM | POA: Diagnosis not present

## 2023-01-17 DIAGNOSIS — I10 Essential (primary) hypertension: Secondary | ICD-10-CM | POA: Diagnosis present

## 2023-01-17 DIAGNOSIS — M810 Age-related osteoporosis without current pathological fracture: Secondary | ICD-10-CM | POA: Diagnosis present

## 2023-01-17 DIAGNOSIS — Z8673 Personal history of transient ischemic attack (TIA), and cerebral infarction without residual deficits: Secondary | ICD-10-CM

## 2023-01-17 DIAGNOSIS — Z79890 Hormone replacement therapy (postmenopausal): Secondary | ICD-10-CM | POA: Diagnosis not present

## 2023-01-17 DIAGNOSIS — Z515 Encounter for palliative care: Secondary | ICD-10-CM

## 2023-01-17 DIAGNOSIS — I495 Sick sinus syndrome: Secondary | ICD-10-CM | POA: Diagnosis present

## 2023-01-17 DIAGNOSIS — R238 Other skin changes: Secondary | ICD-10-CM | POA: Diagnosis not present

## 2023-01-17 DIAGNOSIS — Z7901 Long term (current) use of anticoagulants: Secondary | ICD-10-CM | POA: Diagnosis not present

## 2023-01-17 DIAGNOSIS — R0602 Shortness of breath: Secondary | ICD-10-CM | POA: Diagnosis not present

## 2023-01-17 DIAGNOSIS — E039 Hypothyroidism, unspecified: Secondary | ICD-10-CM | POA: Diagnosis present

## 2023-01-17 DIAGNOSIS — R1900 Intra-abdominal and pelvic swelling, mass and lump, unspecified site: Secondary | ICD-10-CM

## 2023-01-17 DIAGNOSIS — I700 Atherosclerosis of aorta: Secondary | ICD-10-CM | POA: Diagnosis present

## 2023-01-17 DIAGNOSIS — D649 Anemia, unspecified: Secondary | ICD-10-CM

## 2023-01-17 DIAGNOSIS — K219 Gastro-esophageal reflux disease without esophagitis: Secondary | ICD-10-CM | POA: Diagnosis present

## 2023-01-17 DIAGNOSIS — J9601 Acute respiratory failure with hypoxia: Secondary | ICD-10-CM

## 2023-01-17 DIAGNOSIS — I639 Cerebral infarction, unspecified: Secondary | ICD-10-CM

## 2023-01-17 DIAGNOSIS — C9591 Leukemia, unspecified, in remission: Secondary | ICD-10-CM | POA: Diagnosis present

## 2023-01-17 DIAGNOSIS — C20 Malignant neoplasm of rectum: Secondary | ICD-10-CM | POA: Diagnosis present

## 2023-01-17 DIAGNOSIS — Z7189 Other specified counseling: Secondary | ICD-10-CM | POA: Diagnosis not present

## 2023-01-17 DIAGNOSIS — C7800 Secondary malignant neoplasm of unspecified lung: Secondary | ICD-10-CM | POA: Diagnosis present

## 2023-01-17 DIAGNOSIS — R5383 Other fatigue: Secondary | ICD-10-CM | POA: Diagnosis not present

## 2023-01-17 DIAGNOSIS — K922 Gastrointestinal hemorrhage, unspecified: Secondary | ICD-10-CM

## 2023-01-17 DIAGNOSIS — I080 Rheumatic disorders of both mitral and aortic valves: Secondary | ICD-10-CM | POA: Diagnosis present

## 2023-01-17 DIAGNOSIS — E785 Hyperlipidemia, unspecified: Secondary | ICD-10-CM

## 2023-01-17 DIAGNOSIS — R140 Abdominal distension (gaseous): Secondary | ICD-10-CM | POA: Diagnosis not present

## 2023-01-17 DIAGNOSIS — E440 Moderate protein-calorie malnutrition: Secondary | ICD-10-CM | POA: Diagnosis present

## 2023-01-17 DIAGNOSIS — D6489 Other specified anemias: Secondary | ICD-10-CM | POA: Diagnosis not present

## 2023-01-17 DIAGNOSIS — E119 Type 2 diabetes mellitus without complications: Secondary | ICD-10-CM | POA: Diagnosis not present

## 2023-01-17 DIAGNOSIS — I480 Paroxysmal atrial fibrillation: Secondary | ICD-10-CM

## 2023-01-17 DIAGNOSIS — K5730 Diverticulosis of large intestine without perforation or abscess without bleeding: Secondary | ICD-10-CM | POA: Diagnosis present

## 2023-01-17 DIAGNOSIS — Z800 Family history of malignant neoplasm of digestive organs: Secondary | ICD-10-CM

## 2023-01-17 DIAGNOSIS — Z888 Allergy status to other drugs, medicaments and biological substances status: Secondary | ICD-10-CM

## 2023-01-17 DIAGNOSIS — Z79899 Other long term (current) drug therapy: Secondary | ICD-10-CM

## 2023-01-17 DIAGNOSIS — K6289 Other specified diseases of anus and rectum: Secondary | ICD-10-CM | POA: Diagnosis not present

## 2023-01-17 DIAGNOSIS — F419 Anxiety disorder, unspecified: Secondary | ICD-10-CM

## 2023-01-17 DIAGNOSIS — R519 Headache, unspecified: Secondary | ICD-10-CM | POA: Diagnosis not present

## 2023-01-17 DIAGNOSIS — K5900 Constipation, unspecified: Secondary | ICD-10-CM

## 2023-01-17 DIAGNOSIS — K5909 Other constipation: Secondary | ICD-10-CM | POA: Diagnosis present

## 2023-01-17 DIAGNOSIS — Z6828 Body mass index (BMI) 28.0-28.9, adult: Secondary | ICD-10-CM

## 2023-01-17 DIAGNOSIS — Z833 Family history of diabetes mellitus: Secondary | ICD-10-CM

## 2023-01-17 DIAGNOSIS — I493 Ventricular premature depolarization: Secondary | ICD-10-CM | POA: Diagnosis present

## 2023-01-17 DIAGNOSIS — J90 Pleural effusion, not elsewhere classified: Secondary | ICD-10-CM | POA: Diagnosis not present

## 2023-01-17 DIAGNOSIS — R079 Chest pain, unspecified: Secondary | ICD-10-CM | POA: Diagnosis not present

## 2023-01-17 DIAGNOSIS — E876 Hypokalemia: Secondary | ICD-10-CM | POA: Diagnosis not present

## 2023-01-17 DIAGNOSIS — E1141 Type 2 diabetes mellitus with diabetic mononeuropathy: Secondary | ICD-10-CM | POA: Diagnosis present

## 2023-01-17 DIAGNOSIS — Z9049 Acquired absence of other specified parts of digestive tract: Secondary | ICD-10-CM

## 2023-01-17 DIAGNOSIS — K429 Umbilical hernia without obstruction or gangrene: Secondary | ICD-10-CM

## 2023-01-17 DIAGNOSIS — J9811 Atelectasis: Secondary | ICD-10-CM | POA: Diagnosis not present

## 2023-01-17 DIAGNOSIS — K625 Hemorrhage of anus and rectum: Secondary | ICD-10-CM | POA: Diagnosis present

## 2023-01-17 DIAGNOSIS — Z87891 Personal history of nicotine dependence: Secondary | ICD-10-CM | POA: Diagnosis not present

## 2023-01-17 DIAGNOSIS — I4891 Unspecified atrial fibrillation: Secondary | ICD-10-CM | POA: Diagnosis not present

## 2023-01-17 DIAGNOSIS — Z86010 Personal history of colonic polyps: Secondary | ICD-10-CM

## 2023-01-17 DIAGNOSIS — K5669 Other partial intestinal obstruction: Secondary | ICD-10-CM | POA: Diagnosis not present

## 2023-01-17 LAB — URINALYSIS, ROUTINE W REFLEX MICROSCOPIC
Bacteria, UA: NONE SEEN
Bilirubin Urine: NEGATIVE
Glucose, UA: NEGATIVE mg/dL
Hgb urine dipstick: NEGATIVE
Ketones, ur: NEGATIVE mg/dL
Nitrite: NEGATIVE
Protein, ur: NEGATIVE mg/dL
Specific Gravity, Urine: 1.006 (ref 1.005–1.030)
pH: 7 (ref 5.0–8.0)

## 2023-01-17 LAB — COMPREHENSIVE METABOLIC PANEL
ALT: 19 U/L (ref 0–44)
AST: 18 U/L (ref 15–41)
Albumin: 3.5 g/dL (ref 3.5–5.0)
Alkaline Phosphatase: 69 U/L (ref 38–126)
Anion gap: 10 (ref 5–15)
BUN: 12 mg/dL (ref 8–23)
CO2: 28 mmol/L (ref 22–32)
Calcium: 8.7 mg/dL — ABNORMAL LOW (ref 8.9–10.3)
Chloride: 96 mmol/L — ABNORMAL LOW (ref 98–111)
Creatinine, Ser: 0.69 mg/dL (ref 0.44–1.00)
GFR, Estimated: >60 mL/min (ref >60)
Glucose, Bld: 203 mg/dL — ABNORMAL HIGH (ref 70–99)
Potassium: 2.5 mmol/L — CL (ref 3.5–5.1)
Sodium: 134 mmol/L — ABNORMAL LOW (ref 135–145)
Total Bilirubin: 0.7 mg/dL (ref 0.3–1.2)
Total Protein: 6.7 g/dL (ref 6.5–8.1)

## 2023-01-17 LAB — CBC WITH DIFFERENTIAL/PLATELET
Abs Immature Granulocytes: 0.01 10*3/uL (ref 0.00–0.07)
Basophils Absolute: 0.1 10*3/uL (ref 0.0–0.1)
Basophils Relative: 1 %
Eosinophils Absolute: 0.2 10*3/uL (ref 0.0–0.5)
Eosinophils Relative: 4 %
HCT: 34.6 % — ABNORMAL LOW (ref 36.0–46.0)
Hemoglobin: 11.4 g/dL — ABNORMAL LOW (ref 12.0–15.0)
Immature Granulocytes: 0 %
Lymphocytes Relative: 29 %
Lymphs Abs: 1.6 10*3/uL (ref 0.7–4.0)
MCH: 26.5 pg (ref 26.0–34.0)
MCHC: 32.9 g/dL (ref 30.0–36.0)
MCV: 80.5 fL (ref 80.0–100.0)
Monocytes Absolute: 0.4 10*3/uL (ref 0.1–1.0)
Monocytes Relative: 8 %
Neutro Abs: 3.2 10*3/uL (ref 1.7–7.7)
Neutrophils Relative %: 58 %
Platelets: 267 10*3/uL (ref 150–400)
RBC: 4.3 MIL/uL (ref 3.87–5.11)
RDW: 13.9 % (ref 11.5–15.5)
WBC: 5.5 10*3/uL (ref 4.0–10.5)
nRBC: 0 % (ref 0.0–0.2)

## 2023-01-17 LAB — CBG MONITORING, ED: Glucose-Capillary: 118 mg/dL — ABNORMAL HIGH (ref 70–99)

## 2023-01-17 LAB — RETICULOCYTES
Immature Retic Fract: 9.1 % (ref 2.3–15.9)
RBC.: 3.91 MIL/uL (ref 3.87–5.11)
Retic Count, Absolute: 60.2 10*3/uL (ref 19.0–186.0)
Retic Ct Pct: 1.5 % (ref 0.4–3.1)

## 2023-01-17 LAB — LACTIC ACID, PLASMA: Lactic Acid, Venous: 0.7 mmol/L (ref 0.5–1.9)

## 2023-01-17 LAB — LIPASE, BLOOD: Lipase: 34 U/L (ref 11–51)

## 2023-01-17 LAB — CK: Total CK: 66 U/L (ref 38–234)

## 2023-01-17 LAB — PHOSPHORUS: Phosphorus: 3.1 mg/dL (ref 2.5–4.6)

## 2023-01-17 LAB — MAGNESIUM: Magnesium: 1.6 mg/dL — ABNORMAL LOW (ref 1.7–2.4)

## 2023-01-17 MED ORDER — IOHEXOL 300 MG/ML  SOLN
100.0000 mL | Freq: Once | INTRAMUSCULAR | Status: AC | PRN
  Administered 2023-01-17: 80 mL via INTRAVENOUS

## 2023-01-17 MED ORDER — SODIUM CHLORIDE 0.9 % IV BOLUS
1000.0000 mL | Freq: Once | INTRAVENOUS | Status: AC
  Administered 2023-01-17: 1000 mL via INTRAVENOUS

## 2023-01-17 MED ORDER — ALPRAZOLAM 0.5 MG PO TABS
0.5000 mg | ORAL_TABLET | Freq: Once | ORAL | Status: AC
  Administered 2023-01-17: 0.5 mg via ORAL
  Filled 2023-01-17: qty 1

## 2023-01-17 MED ORDER — POTASSIUM CHLORIDE 10 MEQ/100ML IV SOLN
10.0000 meq | INTRAVENOUS | Status: AC
  Administered 2023-01-17 (×3): 10 meq via INTRAVENOUS
  Filled 2023-01-17 (×4): qty 100

## 2023-01-17 MED ORDER — ONDANSETRON HCL 4 MG/2ML IJ SOLN
4.0000 mg | Freq: Once | INTRAMUSCULAR | Status: AC
  Administered 2023-01-17: 4 mg via INTRAVENOUS
  Filled 2023-01-17: qty 2

## 2023-01-17 MED ORDER — MAGNESIUM OXIDE -MG SUPPLEMENT 400 (240 MG) MG PO TABS
800.0000 mg | ORAL_TABLET | Freq: Once | ORAL | Status: AC
  Administered 2023-01-17: 800 mg via ORAL
  Filled 2023-01-17: qty 2

## 2023-01-17 MED ORDER — INSULIN ASPART 100 UNIT/ML IJ SOLN
0.0000 [IU] | INTRAMUSCULAR | Status: DC
  Administered 2023-01-18: 3 [IU] via SUBCUTANEOUS
  Administered 2023-01-18: 1 [IU] via SUBCUTANEOUS
  Administered 2023-01-20 – 2023-01-22 (×8): 2 [IU] via SUBCUTANEOUS
  Administered 2023-01-23: 1 [IU] via SUBCUTANEOUS
  Administered 2023-01-23: 2 [IU] via SUBCUTANEOUS
  Administered 2023-01-23 (×2): 1 [IU] via SUBCUTANEOUS
  Administered 2023-01-24: 2 [IU] via SUBCUTANEOUS
  Filled 2023-01-17: qty 0.09

## 2023-01-17 MED ORDER — POTASSIUM CHLORIDE 20 MEQ PO PACK
40.0000 meq | PACK | Freq: Every day | ORAL | Status: DC
  Administered 2023-01-17: 40 meq via ORAL
  Filled 2023-01-17: qty 2

## 2023-01-17 NOTE — Assessment & Plan Note (Signed)
Unclear etiology likely atelectasis pt improves with deep breaths

## 2023-01-17 NOTE — Assessment & Plan Note (Signed)
-   Check TSH continue home medications Synthroid at  150 mcg po q day

## 2023-01-17 NOTE — Assessment & Plan Note (Signed)
Continue lipitor 20mg po q day 

## 2023-01-17 NOTE — Assessment & Plan Note (Addendum)
Not on any medication, symptoms resolved  Not os aspirin due to chronic bleeding

## 2023-01-17 NOTE — Assessment & Plan Note (Signed)
Will replace and recheck 

## 2023-01-17 NOTE — ED Triage Notes (Signed)
BIB EMS from home for abdominal pain since today, but abdominal distention for a few days EMS reports obvious distension. Has had blood when wiping for over a week. Saw GI Monday and was impacted, given laxatives and has had multiple BM since.

## 2023-01-17 NOTE — Subjective & Objective (Addendum)
Son called because patient was developing abdominal pain and distention noted to have abdominal bulge above umbilicus GI has recommended patient going to emergency department she has significant nausea and vomiting no associated fevers or chills she has problems with constipation and has recently got a dose of MiraLAX and was able to pass a lot of bowels.  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  She has been having rectal bleeding from constipation and fecal impaction

## 2023-01-17 NOTE — H&P (Signed)
[name] QIO:[id number] DOB: [birth year] DOA: 01/17/2023     PCP: Creola Corn, MD   Outpatient Specialists:     GI  Wil[facility]  [facility])    Patient arrived to ER on 01/17/23 at 1900 Referred by Attending Melene Plan, DO   Patient coming from:    home Lives  With family    Chief Complaint:   Chief Complaint  Patient presents with   Abdominal Pain    HPI: [name] is a 89 y.o. female with medical history significant of intermittent GI bleeding, AVMs, GERD, hypothyroidism, chronic constipation, Dm 2, HTN,iron deficiency anemia, CVA    Presented with   frequent bowel movements abdominal pain Son called because patient was developing abdominal pain and distention noted to have abdominal bulge above umbilicus GI has recommended patient going to emergency department she has significant nausea and vomiting no associated fevers or chills she has problems with constipation and has recently got a dose of MiraLAX and was able to pass a lot of bowels.  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  She has been having rectal bleeding from constipation and fecal impaction    She reports she has been having some blood on the tissue Reports she has some twinges in her right hip when she walks No CP no SOB Just cramping abd pain    Denies significant ETOH intake   Does not smoke       Regarding pertinent Chronic problems:   Hyperlipidemia - on statins Lipitor (atorvastatin)  Lipid Panel     Component Value Date/Time   CHOL 127 07/10/2017 0416   TRIG 119 07/10/2017 0416   HDL 44 07/10/2017 0416   CHOLHDL 2.9 07/10/2017 0416   VLDL 24 07/10/2017 0416   LDLCALC 59 07/10/2017 0416    HTN on Norvasc, Benicar   DM 2 -  Lab  Results  Component Value Date   HGBA1C 5.6 07/10/2017   diet controlled  Hypothyroidism:  Lab Results  Component Value Date   TSH 0.673 12/02/2017   on synthroid       Hx of CVA - *with/out residual deficits on Aspirin 81 mg, 325, Plavix  Chronic anemia - baseline hg Hemoglobin & Hematocrit  Recent Labs    01/15/23 1230 01/17/23 1940  HGB 12.2 11.4*   Iron/TIBC/Ferritin/ %Sat No results found for: "IRON", "TIBC", "FERRITIN", "IRONPCTSAT"  While in ER:   Lab Orders         CBC with Differential         Comprehensive metabolic panel         Lipase, blood         Urinalysis, Routine w reflex microscopic -Urine, Clean Catch         Magnesium         CXR -  NON acute  CTabd/pelvis -  nonacute    Following Medications were ordered in ER: Medications  potassium chloride 10 mEq in 100 mL IVPB (10 mEq Intravenous New Bag/Given 01/17/23 2202)  potassium chloride (KLOR-CON) packet 40 mEq (40 mEq Oral Given 01/17/23 2100)  sodium chloride 0.9 % bolus 1,000 mL (1,000 mLs Intravenous New Bag/Given 01/17/23 1941)  ondansetron (  ZOFRAN) injection 4 mg (4 mg Intravenous Given 01/17/23 1941)  magnesium oxide (MAG-OX) tablet 800 mg (800 mg Oral Given 01/17/23 2100)  iohexol (OMNIPAQUE) 300 MG/ML solution 100 mL (80 mLs Intravenous Contrast Given 01/17/23 2043)  ALPRAZolam Prudy Feeler) tablet 0.5 mg (0.5 mg Oral Given 01/17/23 2121)    _______________________________________________________ ER Provider Called:  [facility] Dr. Tomasa Rand They Recommend admit to medicine    Will see in AM      ED Triage Vitals  Enc Vitals Group     BP 01/17/23 1907 (!) 164/68     Pulse Rate 01/17/23 1907 92     Resp 01/17/23 1907 18     Temp 01/17/23 1907 98.2 F (36.8 C)     Temp Source 01/17/23 1907 Oral     SpO2 01/17/23 1907 97 %     Weight 01/17/23 1914 147 lb (66.7 kg)     Height 01/17/23 1914 5' (1.524 m)     Head Circumference --      Peak Flow --      Pain Score 01/17/23 1913 7     Pain Loc --       Pain Edu? --      Excl. in GC? --   TMAX(24)@     _________________________________________ Significant initial  Findings: Abnormal Labs Reviewed  CBC WITH DIFFERENTIAL/PLATELET - Abnormal; Notable for the following components:      Result Value   Hemoglobin 11.4 (*)    HCT 34.6 (*)    All other components within normal limits  COMPREHENSIVE METABOLIC PANEL - Abnormal; Notable for the following components:   Sodium 134 (*)    Potassium 2.5 (*)    Chloride 96 (*)    Glucose, Bld 203 (*)    Calcium 8.7 (*)    All other components within normal limits  URINALYSIS, ROUTINE W REFLEX MICROSCOPIC - Abnormal; Notable for the following components:   Color, Urine STRAW (*)    Leukocytes,Ua TRACE (*)    All other components within normal limits  MAGNESIUM - Abnormal; Notable for the following components:   Magnesium 1.6 (*)    All other components within normal limits         ECG: Ordered Personally reviewed and interpreted by me showing: HR : 93 Rhythm:Sinus rhythm Prolonged PR interval Anteroseptal infarct, old Minimal ST depression, anterolateral leads QTC 449   ________________ This patient meets SIRS Criteria and may be septic.    The recent clinical data is shown below. Vitals:   01/17/23 2030 01/17/23 2100 01/17/23 2104 01/17/23 2130  BP: (!) 155/60 (!) 146/56  (!) 110/96  Pulse: 84 92 93 99  Resp: 18 15 (!) 21 19  Temp:      TempSrc:      SpO2: 91% 90% (!) 87% 90%  Weight:      Height:        WBC     Component Value Date/Time   WBC 5.5 01/17/2023 1940   LYMPHSABS 1.6 01/17/2023 1940   MONOABS 0.4 01/17/2023 1940   EOSABS 0.2 01/17/2023 1940   BASOSABS 0.1 01/17/2023 1940         UA   no evidence of UTI     Urine analysis:    Component Value Date/Time   COLORURINE STRAW (A) 01/17/2023 1940   APPEARANCEUR CLEAR 01/17/2023 1940   LABSPEC 1.006 01/17/2023 1940   PHURINE 7.0 01/17/2023 1940   GLUCOSEU NEGATIVE 01/17/2023 1940   HGBUR NEGATIVE  01/17/2023 1940  BILIRUBINUR NEGATIVE 01/17/2023 1940   KETONESUR NEGATIVE 01/17/2023 1940   PROTEINUR NEGATIVE 01/17/2023 1940   UROBILINOGEN 0.2 10/05/2008 1806   NITRITE NEGATIVE 01/17/2023 1940   LEUKOCYTESUR TRACE (A) 01/17/2023 1940    Results for orders placed or performed during the hospital encounter of 10/05/08  Urine culture     Status: None   Collection Time: 10/05/08  6:06 PM   Specimen: Urine, Random  Result Value Ref Range Status   Specimen Description URINE, RANDOM  Final   Special Requests NONE  Final   Colony Count 30,000 COLONIES/ML  Final   Culture   Final    Multiple bacterial morphotypes present, none predominant. Suggest appropriate recollection if clinically indicated.   Report Status 10/07/2008 FINAL  Final    ABX started Antibiotics Given (last 72 hours)     None       No results found for the last 90 days.   __________________________________________________________ Recent Labs  Lab 01/17/23 1940 01/17/23 1948  NA 134*  --   K 2.5*  --   CO2 28  --   GLUCOSE 203*  --   BUN 12  --   CREATININE 0.69  --   CALCIUM 8.7*  --   MG  --  1.6*    Cr  stable,    Lab Results  Component Value Date   CREATININE 0.69 01/17/2023   CREATININE 0.66 12/05/2017   CREATININE 0.65 12/02/2017    Recent Labs  Lab 01/17/23 1940  AST 18  ALT 19  ALKPHOS 69  BILITOT 0.7  PROT 6.7  ALBUMIN 3.5   Lab Results  Component Value Date   CALCIUM 8.7 (L) 01/17/2023    Plt: Lab Results  Component Value Date   PLT 267 01/17/2023       Recent Labs  Lab 01/15/23 1230 01/17/23 1940  WBC 8.0 5.5  NEUTROABS 4.4 3.2  HGB 12.2 11.4*  HCT 36.4 34.6*  MCV 78.8 80.5  PLT 299.0 267    HG/HCT  stable,      Component Value Date/Time   HGB 11.4 (L) 01/17/2023 1940   HCT 34.6 (L) 01/17/2023 1940   MCV 80.5 01/17/2023 1940      Recent Labs  Lab 01/17/23 1940  LIPASE 34   No results for input(s): "AMMONIA" in the last 168 hours.     _______________________________________________ Hospitalist was called for admission for  hypokalemia. hypomagnesemia  The following Work up has been ordered so far:  Orders Placed This Encounter  Procedures   CT ABDOMEN PELVIS W CONTRAST   DG Chest Port 1 View   CBC with Differential   Comprehensive metabolic panel   Lipase, blood   Urinalysis, Routine w reflex microscopic -Urine, Clean Catch   Magnesium   Consult to hospitalist   EKG 12-Lead     OTHER Significant initial  Findings:  labs showing:     DM  labs:  HbA1C: No results for input(s): "HGBA1C" in the last 8760 hours.     CBG (last 3)  No results for input(s): "GLUCAP" in the last 72 hours.        Cultures:    Component Value Date/Time   SDES URINE, RANDOM 10/05/2008 1806   SPECREQUEST NONE 10/05/2008 1806   CULT  10/05/2008 1806    Multiple bacterial morphotypes present, none predominant. Suggest appropriate recollection if clinically indicated.   REPTSTATUS 10/07/2008 FINAL 10/05/2008 1806     Radiological Exams on Admission: [facility]  Result Date: 01/17/2023 CLINICAL DATA:  Chest pain EXAM: PORTABLE CHEST 1 VIEW COMPARISON:  12/05/2017 FINDINGS: Stable cardiomediastinal silhouette. Aortic atherosclerotic calcification. Diffuse interstitial coarsening greatest in the lower lungs. No focal consolidation, pleural effusion, pneumothorax. No definite displaced rib fractures. IMPRESSION: No active disease. Interstitial coarsening in the lower lungs is similar to 12/05/2017. Electronically Signed   By: Minerva Fester M.D.   On: 01/17/2023 21:24   CT ABDOMEN PELVIS W CONTRAST  Result Date: 01/17/2023 CLINICAL DATA:  Abdominal pain and distention. Side GI on Monday and was impacted and given laxatives. Has had multiple bowel movements since. EXAM: CT ABDOMEN AND PELVIS WITH CONTRAST TECHNIQUE: Multidetector CT imaging of the abdomen and pelvis was performed using the standard protocol following bolus  administration of intravenous contrast. RADIATION DOSE REDUCTION: This exam was performed according to the departmental dose-optimization program which includes automated exposure control, adjustment of the mA and/or kV according to patient size and/or use of iterative reconstruction technique. CONTRAST:  80mL OMNIPAQUE IOHEXOL 300 MG/ML  SOLN COMPARISON:  Abdominal ultrasound 07/12/2017 and report from CT abdomen and pelvis 01/20/2019 FINDINGS: Lower chest: No acute abnormality. Hepatobiliary: Cholecystectomy. No focal hepatic lesion. Post cholecystectomy prominence of the common bile duct. Pancreas: Unremarkable. Spleen: Unremarkable. Adrenals/Urinary Tract: Normal adrenal glands. No urinary calculi or hydronephrosis. Unremarkable bladder. Stomach/Bowel: Normal caliber large and small bowel. No bowel wall thickening. The appendix is not visualized. Stomach is within normal limits. Duodenal diverticulum. Vascular/Lymphatic: Aortic atherosclerotic calcification. Calcification at the origins of the celiac axis and SMA cause moderate narrowing. 1.9 cm left common iliac artery ectasia. No lymphadenopathy. Reproductive: Uterus and bilateral adnexa are unremarkable. Other: No free intraperitoneal fluid or air. Musculoskeletal: No acute fracture. IMPRESSION: 1. No acute findings in the abdomen or pelvis. Aortic Atherosclerosis (ICD10-I70.0). Electronically Signed   By: Minerva Fester M.D.   On: 01/17/2023 21:06   _______________________________________________________________________________________________________ Latest  Blood pressure (!) 110/96, pulse 99, temperature 98.2 F (36.8 C), temperature source Oral, resp. rate 19, height 5' (1.524 m), weight 66.7 kg, SpO2 90 %.   Vitals  labs and radiology finding personally reviewed  Review of Systems:    Pertinent positives include:  diarrhea,  abdominal pain, nausea Constitutional:  No weight loss, night sweats, Fevers, chills, fatigue, weight loss  HEENT:   No headaches, Difficulty swallowing,Tooth/dental problems,Sore throat,  No sneezing, itching, ear ache, nasal congestion, post nasal drip,  Cardio-vascular:  No chest pain, Orthopnea, PND, anasarca, dizziness, palpitations.no Bilateral lower extremity swelling  GI:  No heartburn, indigestion, , vomiting, change in bowel habits, loss of appetite, melena, blood in stool, hematemesis Resp:  no shortness of breath at rest. No dyspnea on exertion, No excess mucus, no productive cough, No non-productive cough, No coughing up of blood.No change in color of mucus.No wheezing. Skin:  no rash or lesions. No jaundice GU:  no dysuria, change in color of urine, no urgency or frequency. No straining to urinate.  No flank pain.  Musculoskeletal:  No joint pain or no joint swelling. No decreased range of motion. No back pain.  Psych:  No change in mood or affect. No depression or anxiety. No memory loss.  Neuro: no localizing neurological complaints, no tingling, no weakness, no double vision, no gait abnormality, no slurred speech, no confusion  All systems reviewed and apart from HOPI all are negative _______________________________________________________________________________________________ Past Medical History:   Past Medical History:  Diagnosis Date   Anemia    Anxiety    Colon polyps    DDD (  degenerative disc disease), cervical    Diabetes mellitus    Diabetes mellitus, type 2 (HCC)    Hyperlipidemia    Hypertension    Hypothyroidism    Leukemia (HCC) in remission   Mucous retention cyst of maxillary sinus    Osteoporosis    Pinched nerve In neck   Plantar fasciitis    Stroke [facility])    Vitamin D deficiency      Past Surgical History:  Procedure Laterality Date   APPENDECTOMY     CHOLECYSTECTOMY     2004   COLONOSCOPY N/A 02/24/2013   Procedure: COLONOSCOPY;  Surgeon: Meryl Dare, MD;  Location: [facility] ENDOSCOPY;  Service: Endoscopy;  Laterality: N/A;   COLONOSCOPY W/  POLYPECTOMY     COLONOSCOPY WITH PROPOFOL N/A 12/03/2017   Procedure: COLONOSCOPY WITH PROPOFOL;  Surgeon: Rachael Fee, MD;  Location: [facility] ENDOSCOPY;  Service: Gastroenterology;  Laterality: N/A;   ESOPHAGOGASTRODUODENOSCOPY     ESOPHAGOGASTRODUODENOSCOPY N/A 02/24/2013   Procedure: ESOPHAGOGASTRODUODENOSCOPY (EGD);  Surgeon: Meryl Dare, MD;  Location: [facility] ENDOSCOPY;  Service: Endoscopy;  Laterality: N/A;   ESOPHAGOGASTRODUODENOSCOPY (EGD) WITH PROPOFOL N/A 12/03/2017   Procedure: ESOPHAGOGASTRODUODENOSCOPY (EGD) WITH PROPOFOL;  Surgeon: Rachael Fee, MD;  Location: [facility] ENDOSCOPY;  Service: Gastroenterology;  Laterality: N/A;   EYE SURGERY     bilateral catracts    Social History:  Ambulatory   independently     reports that she has quit smoking. She has never used smokeless tobacco. She reports that she does not drink alcohol and does not use drugs.     Family History:   Family History  Problem Relation Age of Onset   Diabetes Other    Colon cancer Son    Esophageal cancer Neg Hx    Stomach cancer Neg Hx    ______________________________________________________________________________________________ Allergies: Allergies  Allergen Reactions   Prednisone     Climbs the walls     Prior to Admission medications   Medication Sig Start Date End Date Taking? Authorizing Provider  albuterol (PROVENTIL HFA;VENTOLIN HFA) 108 (90 Base) MCG/ACT inhaler Inhale 1-2 puffs into the lungs every 6 (six) hours as needed for wheezing or shortness of breath. 12/05/17   Azalia Bilis, MD  ALPRAZolam Prudy Feeler) 0.5 MG tablet Take 0.5 mg by mouth 3 (three) times daily as needed. For anxiety    [provider]  amLODipine (NORVASC) 2.5 MG tablet Take 10 mg by mouth daily.     [provider]  atorvastatin (LIPITOR) 20 MG tablet Take 20 mg by mouth daily.    [provider]  betamethasone dipropionate (DIPROLENE) 0.05 % cream Apply 1 application topically 2 (two)  times daily. Patient not taking: Reported on 01/15/2023    [provider]  cholecalciferol (VITAMIN D3) 25 MCG (1000 UNIT) tablet Take 1,000 Units by mouth daily.    [provider]  Diclofenac Sodium CR 100 MG 24 hr tablet Take 1 tablet (100 mg total) by mouth daily. Patient not taking: Reported on 01/15/2023 06/29/20   Palumbo, April, MD  ferrous sulfate 325 (65 FE) MG EC tablet Take 1 tablet (325 mg total) by mouth daily with breakfast. Patient not taking: Reported on 01/15/2023 12/04/17   Burnadette Pop, MD  levothyroxine (SYNTHROID, LEVOTHROID) 150 MCG tablet Take 150 mcg by mouth daily.    [provider]  lidocaine (LIDODERM) 5 % Place 1 patch onto the skin daily. Remove & Discard patch within 12 hours or as directed by MD Patient not taking: Reported  on 01/15/2023 06/29/20   Palumbo, April, MD  olmesartan-hydrochlorothiazide (BENICAR HCT) 40-25 MG tablet Take 1 tablet by mouth daily. 10/15/17   [provider]  pantoprazole (PROTONIX) 40 MG tablet Take 1 tablet (40 mg total) by mouth daily. 12/04/17 01/15/23  Burnadette Pop, MD  potassium chloride SA (K-DUR,KLOR-CON) 20 MEQ tablet Take 20 mEq by mouth daily.    [provider]  traZODone (DESYREL) 50 MG tablet Take 50 mg by mouth at bedtime. Patient not taking: Reported on 01/15/2023 11/20/17   [provider]    ___________________________________________________________________________________________________ Physical Exam:    01/17/2023    9:30 PM 01/17/2023    9:04 PM 01/17/2023    9:00 PM  Vitals with BMI  Systolic 110  146  Diastolic 96  56  Pulse 99 93 92     1. General:  in No  Acute distress   well   -appearing 2. Psychological: Alert and   Oriented 3. Head/ENT:    Dry Mucous Membranes                          Head Non traumatic, neck supple                           Poor Dentition 4. SKIN: decreased Skin turgor,  Skin clean Dry and intact no rash    5. Heart: Regular rate and  rhythm no  Murmur, no Rub or gallop 6. Lungs:  no wheezes or crackles   7. Abdomen: Soft,  right lower-tender, Non distended   obese  bowel sounds decreased 8. Lower extremities: no clubbing, cyanosis, no  edema 9. Neurologically Grossly intact, moving all 4 extremities equally   10. MSK: Normal range of motion    Chart has been reviewed  ______________________________________________________________________________________________  Assessment/Plan 89 y.o. female with medical history significant of intermittent GI bleeding, AVMs, GERD, hypothyroidism, chronic constipation, Dm 2, HTN,iron deficiency anemia, CVA  Admitted for   hypokalemia. hypomagnesemia  Present on Admission:  Hypokalemia  Stroke (HCC)  Acute respiratory failure with hypoxia (HCC)  Symptomatic anemia  Hyperlipidemia  Anxiety  Hypothyroidism  GIB (gastrointestinal bleeding)  Hypomagnesemia  Periumbilical hernia     Diabetes mellitus without complication (HCC)  - Order Sensitive  SSI    -  check TSH and HgA1C     Stroke (HCC) Not on any medication, symptoms resolved  Not os aspirin due to chronic bleeding   Acute respiratory failure with hypoxia (HCC) Unclear etiology likely atelectasis pt improves with deep breaths  Symptomatic anemia Chronic stable Monitor cbc  Hyperlipidemia Continue lipitor 20 mg po q day  Anxiety Cont prn xanax  Hypothyroidism - Check TSH continue home medications Synthroid at  150 mcg po q day   GIB (gastrointestinal bleeding) Hx of AVM [facility] gi is aware Clear diet  npo post midnight  Hypokalemia Will replace and recheck   Hypomagnesemia Will replace and recheck   Periumbilical hernia No evidence of incarceration   Other plan as per orders.  DVT prophylaxis:  SCD         Code Status:    Code Status: Prior FULL CODE   as per patient   I had personally discussed CODE STATUS with patient and family    ACPnone   Family Communication:   Family at  Bedside   plan of care was discussed  with   Daughter in law future  Diet npo post midnight   Disposition Plan:    To home once workup is complete and patient is stable   Following barriers for discharge:                            Electrolytes corrected                               Anemia  stable                           Will need consultants to evaluate patient prior to discharge       Consult Orders  (From admission, onward)           Start     Ordered   01/17/23 2118  Consult to hospitalist  Once       Provider:  (Not yet assigned)  Question Answer Comment  Place call to: [facility]   Reason for Consult Admit      01/17/23 2117                               Would benefit from PT/OT eval prior to DC  Ordered                                        Consults called:  [facility] is aware   Treatment Team:  Meryl Dare, MD  Admission status:  ED Disposition     ED Disposition  Admit   Condition  --   Comment  [facility]ospital [facility] [[id number]]  Level of Care: Telemetry [5]  Admit to tele based on following criteria: Other see comments  Comments: hypokalemia  May place patient in observation at [facility] or [facility] if equivalent level of care is available:: No  Covid Evaluation: Asymptomatic - no recent exposure (last 10 days) testing not required  Diagnosis: Hypokalemia [[id number]]  Admitting Physician: Therisa Doyne [[id number]]  Attending Physician: Therisa Doyne [[id number]]           Obs    Level of care     tele  For 12H    Ilyse Tremain 01/17/2023, 10:32 PM    [facility]     after 2 AM please page floor coverage PA If 7AM-7PM, please contact the day team taking care of the patient using [email or web address]

## 2023-01-17 NOTE — ED Notes (Signed)
Dr. Adela Lank notified of potassium of 2.5

## 2023-01-17 NOTE — ED Notes (Signed)
Pt requesting to take home xanax. Dr. Adela Lank notified to see if pt could receive dose here

## 2023-01-17 NOTE — Telephone Encounter (Signed)
The patient's son called the on call provider again this evening because he states that his mother has developed worsening abdominal pain and abdominal distention.  He had called the on call provider yesterday evening because of abdominal bulge above the umbilicus and a CT was ordered to evaluate for a possible hernia, with recommendations for the patient to go to the ED for worsening abdominal pain/nausea/vomiting.  When I called the patient back, he did not answer. I left a voicemail reiterating Dr. Elesa Hacker advice from the previous night.  The patient called again and told me that he had called the EMS because his mother's pain and abdominal distention had continued to worsen.  She was nauseated, but had not vomited.  No fevers/chills.  After passing numerous bowel movements following her clinic visit with the MiraLax prep, her bowel movements have slowed.  She tried passing stool this evening, but was only able to pass a small amount of blood on the toilet paper.   In addition, he says her systolic blood pressure was 200.  I told the patient's son that I agreed with the decision to have her evaluated in the ED to assess for bowel obstruction/hernia.  Will await further evaluation in the ED

## 2023-01-17 NOTE — Telephone Encounter (Signed)
Spoke with the son and the patient. Patient is concerned about when this prep will slow down. "Going to the bathroom so often." Advised she will likely have a decrease in the frequency of the bowel movements by this evening. She is advised to encourage fluids, broth soups, and juices to maintain hydration. She has not seen any blood in the bowel movements.

## 2023-01-17 NOTE — Assessment & Plan Note (Signed)
Chronic stable Monitor cbc

## 2023-01-17 NOTE — Assessment & Plan Note (Signed)
No evidence of incarceration

## 2023-01-17 NOTE — Telephone Encounter (Signed)
Patient's son called back on the evening of 5/7.  Responded to him.  Patient states son states that mother is now experiencing a bulge above her navel that she has never experienced previously.  It is not painful.  She is able to reduce it by lying down.  She is not having any fevers or chills.  She is slowing down her bowel movements and is wondering whether she should continue the rest of the colonoscopy preparation. I have reiterated that if at any time over the course of tonight or in the coming weeks or months she will to have a significant amount of pain with a bulge in her abdomen that is not reducing, that she would need to go to the emergency department urgently to ensure she does not have an incarcerated hernia. In this particular setting, I think a nonurgent CT abdomen/pelvis should be performed as a result of this new finding. I also think it is reasonable for her to go ahead and initiate a cleanout and then decide on continued bowel regimen after she finishes the rest of her colon preparation for clear purposes. They agree with this plan of action and are thankful for the call back. I will forward my recommendations to the patient's GI team.  Beth, 1) call back tomorrow (5/8) and see how the patient is doing 2) order nonurgent CT abdomen/pelvis with IV/p.o. contrast (will need to draw a BMP to ensure that she can have it with IV contrast) 3) further recommendations as per primary GI team NP Guenther/Dr. Russella Dar 4) reiterate again, that if she has the bulge, that likely is a hernia, and she has severe pain she needs to go to the emergency department urgently.   Corliss Parish, MD [facility] Office # [phone]

## 2023-01-17 NOTE — Assessment & Plan Note (Signed)
Hx of AVM [facility] gi is aware Clear diet  npo post midnight

## 2023-01-17 NOTE — ED Notes (Signed)
Pt O2 dropped to 88%, encouraged deep breathing and O2 returned to 92% on RA. Dr Adela Lank notified

## 2023-01-17 NOTE — Telephone Encounter (Signed)
Spoke with the son of the patient. Patient finished "all but 1 glass of her prep." She became nauseated. She has been passing "mush" stool x 4 this morning. Asks if she can eat. Advised yes to having her eat. She is not having any pain. She has not had vomiting. The bulge in the abdomen is present and unchanged.  Agrees to the plan as outlined by Dr Meridee Score. Ordered tests under Willette Cluster, NP.

## 2023-01-17 NOTE — Assessment & Plan Note (Signed)
-   Order Sensitive SSI  °  ° -  check TSH and HgA1C °  ° ° °

## 2023-01-17 NOTE — ED Provider Notes (Signed)
[place] EMERGENCY DEPARTMENT AT [facility] Provider Note   CSN: [id number] Arrival date & time: 01/17/23  1900     History  Chief Complaint  Patient presents with   Abdominal Pain    [name] is a 89 y.o. female.  89 yo F with a cc of abdominal pain and bloating.  She went to see the GI doctor due to her rectal bleeding and they did an exam and told her that she was impacted.  She was given a cleanout protocol at home.  She has been having bowel movements since then that she describes as mushy.  She developed some upper abdominal discomfort to call the office and they told her to come to the ED for CT imaging.  She has had some nausea but no vomiting.  Has a prior appendectomy and cholecystectomy.  No fevers.   Abdominal Pain      Home Medications Prior to Admission medications   Medication Sig Start Date End Date Taking? Authorizing Provider  albuterol (PROVENTIL HFA;VENTOLIN HFA) 108 (90 Base) MCG/ACT inhaler Inhale 1-2 puffs into the lungs every 6 (six) hours as needed for wheezing or shortness of breath. 12/05/17   Azalia Bilis, MD  ALPRAZolam Prudy Feeler) 0.5 MG tablet Take 0.5 mg by mouth 3 (three) times daily as needed. For anxiety    [provider]  amLODipine (NORVASC) 2.5 MG tablet Take 10 mg by mouth daily.     [provider]  atorvastatin (LIPITOR) 20 MG tablet Take 20 mg by mouth daily.    [provider]  betamethasone dipropionate (DIPROLENE) 0.05 % cream Apply 1 application topically 2 (two) times daily. Patient not taking: Reported on 01/15/2023    [provider]  cholecalciferol (VITAMIN D3) 25 MCG (1000 UNIT) tablet Take 1,000 Units by mouth daily.    [provider]  Diclofenac Sodium CR 100 MG 24 hr tablet Take 1 tablet (100 mg total) by mouth daily. Patient not taking: Reported on 01/15/2023 06/29/20   Palumbo, April, MD  ferrous sulfate 325 (65 FE) MG EC tablet Take 1 tablet (325 mg total) by mouth  daily with breakfast. Patient not taking: Reported on 01/15/2023 12/04/17   Burnadette Pop, MD  levothyroxine (SYNTHROID, LEVOTHROID) 150 MCG tablet Take 150 mcg by mouth daily.    [provider]  lidocaine (LIDODERM) 5 % Place 1 patch onto the skin daily. Remove & Discard patch within 12 hours or as directed by MD Patient not taking: Reported on 01/15/2023 06/29/20   Palumbo, April, MD  olmesartan-hydrochlorothiazide (BENICAR HCT) 40-25 MG tablet Take 1 tablet by mouth daily. 10/15/17   [provider]  pantoprazole (PROTONIX) 40 MG tablet Take 1 tablet (40 mg total) by mouth daily. 12/04/17 01/15/23  Burnadette Pop, MD  potassium chloride SA (K-DUR,KLOR-CON) 20 MEQ tablet Take 20 mEq by mouth daily.    [provider]  traZODone (DESYREL) 50 MG tablet Take 50 mg by mouth at bedtime. Patient not taking: Reported on 01/15/2023 11/20/17   [provider]      Allergies    Prednisone    Review of Systems   Review of Systems  Gastrointestinal:  Positive for abdominal pain.    Physical Exam Updated Vital Signs BP (!) 110/96   Pulse 99   Temp 98.2 F (36.8 C) (Oral)   Resp 19   Ht 5' (1.524 m)   Wt 66.7 kg   SpO2 90%   BMI 28.71 kg/m  Physical  Exam Vitals and nursing note reviewed.  Constitutional:      General: She is not in acute distress.    Appearance: She is well-developed. She is not diaphoretic.  HENT:     Head: Normocephalic and atraumatic.  Eyes:     Pupils: Pupils are equal, round, and reactive to light.  Cardiovascular:     Rate and Rhythm: Normal rate and regular rhythm.     Heart sounds: No murmur heard.    No friction rub. No gallop.  Pulmonary:     Effort: Pulmonary effort is normal.     Breath sounds: No wheezing or rales.  Abdominal:     General: There is distension.     Palpations: Abdomen is soft.     Tenderness: There is no abdominal tenderness.     Comments: Distended abdomen tympanitic to percussion.  I feel a defect to  the supraumbilical region.  No obvious hernia.  Musculoskeletal:        General: No tenderness.     Cervical back: Normal range of motion and neck supple.  Skin:    General: Skin is warm and dry.  Neurological:     Mental Status: She is alert and oriented to person, place, and time.  Psychiatric:        Behavior: Behavior normal.     ED Results / Procedures / Treatments   Labs (all labs ordered are listed, but only abnormal results are displayed) Labs Reviewed  CBC WITH DIFFERENTIAL/PLATELET - Abnormal; Notable for the following components:      Result Value   Hemoglobin 11.4 (*)    HCT 34.6 (*)    All other components within normal limits  COMPREHENSIVE METABOLIC PANEL - Abnormal; Notable for the following components:   Sodium 134 (*)    Potassium 2.5 (*)    Chloride 96 (*)    Glucose, Bld 203 (*)    Calcium 8.7 (*)    All other components within normal limits  URINALYSIS, ROUTINE W REFLEX MICROSCOPIC - Abnormal; Notable for the following components:   Color, Urine STRAW (*)    Leukocytes,Ua TRACE (*)    All other components within normal limits  MAGNESIUM - Abnormal; Notable for the following components:   Magnesium 1.6 (*)    All other components within normal limits  LIPASE, BLOOD    EKG EKG Interpretation  Date/Time:  Wednesday Jan 17 2023 19:13:08 EDT Ventricular Rate:  93 PR Interval:  225 QRS Duration: 81 QT Interval:  361 QTC Calculation: 449 R Axis:   33 Text Interpretation: Sinus rhythm Prolonged PR interval Anteroseptal infarct, old Minimal ST depression, anterolateral leads No significant change since last tracing Confirmed by Melene Plan [phone]) on 01/17/2023 7:14:38 PM  Radiology DG Chest Port 1 View  Result Date: 01/17/2023 CLINICAL DATA:  Chest pain EXAM: PORTABLE CHEST 1 VIEW COMPARISON:  12/05/2017 FINDINGS: Stable cardiomediastinal silhouette. Aortic atherosclerotic calcification. Diffuse interstitial coarsening greatest in the lower lungs. No  focal consolidation, pleural effusion, pneumothorax. No definite displaced rib fractures. IMPRESSION: No active disease. Interstitial coarsening in the lower lungs is similar to 12/05/2017. Electronically Signed   By: Minerva Fester M.D.   On: 01/17/2023 21:24   CT ABDOMEN PELVIS W CONTRAST  Result Date: 01/17/2023 CLINICAL DATA:  Abdominal pain and distention. Side GI on Monday and was impacted and given laxatives. Has had multiple bowel movements since. EXAM: CT ABDOMEN AND PELVIS WITH CONTRAST TECHNIQUE: Multidetector CT imaging of the abdomen and pelvis was performed using  the standard protocol following bolus administration of intravenous contrast. RADIATION DOSE REDUCTION: This exam was performed according to the departmental dose-optimization program which includes automated exposure control, adjustment of the mA and/or kV according to patient size and/or use of iterative reconstruction technique. CONTRAST:  80mL OMNIPAQUE IOHEXOL 300 MG/ML  SOLN COMPARISON:  Abdominal ultrasound 07/12/2017 and report from CT abdomen and pelvis 01/20/2019 FINDINGS: Lower chest: No acute abnormality. Hepatobiliary: Cholecystectomy. No focal hepatic lesion. Post cholecystectomy prominence of the common bile duct. Pancreas: Unremarkable. Spleen: Unremarkable. Adrenals/Urinary Tract: Normal adrenal glands. No urinary calculi or hydronephrosis. Unremarkable bladder. Stomach/Bowel: Normal caliber large and small bowel. No bowel wall thickening. The appendix is not visualized. Stomach is within normal limits. Duodenal diverticulum. Vascular/Lymphatic: Aortic atherosclerotic calcification. Calcification at the origins of the celiac axis and SMA cause moderate narrowing. 1.9 cm left common iliac artery ectasia. No lymphadenopathy. Reproductive: Uterus and bilateral adnexa are unremarkable. Other: No free intraperitoneal fluid or air. Musculoskeletal: No acute fracture. IMPRESSION: 1. No acute findings in the abdomen or pelvis.  Aortic Atherosclerosis (ICD10-I70.0). Electronically Signed   By: Minerva Fester M.D.   On: 01/17/2023 21:06    Procedures .Critical Care  Performed by: Melene Plan, DO Authorized by: Melene Plan, DO   Critical care provider statement:    Critical care time (minutes):  35   Critical care time was exclusive of:  Separately billable procedures and treating other patients   Critical care was time spent personally by me on the following activities:  Development of treatment plan with patient or surrogate, discussions with consultants, evaluation of patient's response to treatment, examination of patient, ordering and review of laboratory studies, ordering and review of radiographic studies, ordering and performing treatments and interventions, pulse oximetry, re-evaluation of patient's condition and review of old charts   Care discussed with: admitting provider       Medications Ordered in ED Medications  potassium chloride 10 mEq in 100 mL IVPB (10 mEq Intravenous New Bag/Given 01/17/23 2202)  potassium chloride (KLOR-CON) packet 40 mEq (40 mEq Oral Given 01/17/23 2100)  sodium chloride 0.9 % bolus 1,000 mL (1,000 mLs Intravenous New Bag/Given 01/17/23 1941)  ondansetron (ZOFRAN) injection 4 mg (4 mg Intravenous Given 01/17/23 1941)  magnesium oxide (MAG-OX) tablet 800 mg (800 mg Oral Given 01/17/23 2100)  iohexol (OMNIPAQUE) 300 MG/ML solution 100 mL (80 mLs Intravenous Contrast Given 01/17/23 2043)  ALPRAZolam Prudy Feeler) tablet 0.5 mg (0.5 mg Oral Given 01/17/23 2121)    ED Course/ Medical Decision Making/ A&P                             Medical Decision Making Amount and/or Complexity of Data Reviewed Labs: ordered. Radiology: ordered.  Risk OTC drugs. Prescription drug management.   89 yo F with a chief complaints of diffuse abdominal pain bloating going on for a couple days after doing a cleanout protocol for fecal impaction.  Will obtain a laboratory evaluation bolus of IV fluids CT  imaging.  The patient's potassium was significantly low at 2.5.  Hemoglobin slightly down from check a couple days ago.  Will replenish.  Check a mag level.  Awaiting CT imaging.  CT without obvious intraabdominal pathology.  Transient hypoxia off and on.  Will obtain a portable chest x-ray.  She denies any difficulty breathing.  Per protocol secure chat message was sent to [facility] Dr. Tomasa Rand day team to see in the morning.  Will discuss with  medicine.   The patients results and plan were reviewed and discussed.   Any x-rays performed were independently reviewed by myself.   Differential diagnosis were considered with the presenting HPI.  Medications  potassium chloride 10 mEq in 100 mL IVPB (10 mEq Intravenous New Bag/Given 01/17/23 2202)  potassium chloride (KLOR-CON) packet 40 mEq (40 mEq Oral Given 01/17/23 2100)  sodium chloride 0.9 % bolus 1,000 mL (1,000 mLs Intravenous New Bag/Given 01/17/23 1941)  ondansetron (ZOFRAN) injection 4 mg (4 mg Intravenous Given 01/17/23 1941)  magnesium oxide (MAG-OX) tablet 800 mg (800 mg Oral Given 01/17/23 2100)  iohexol (OMNIPAQUE) 300 MG/ML solution 100 mL (80 mLs Intravenous Contrast Given 01/17/23 2043)  ALPRAZolam Prudy Feeler) tablet 0.5 mg (0.5 mg Oral Given 01/17/23 2121)    Vitals:   01/17/23 2030 01/17/23 2100 01/17/23 2104 01/17/23 2130  BP: (!) 155/60 (!) 146/56  (!) 110/96  Pulse: 84 92 93 99  Resp: 18 15 (!) 21 19  Temp:      TempSrc:      SpO2: 91% 90% (!) 87% 90%  Weight:      Height:        Final diagnoses:  Hypokalemia  Fatigue, unspecified type  Abdominal distention    Admission/ observation were discussed with the admitting physician, patient and/or family and they are comfortable with the plan.          Final Clinical Impression(s) / ED Diagnoses Final diagnoses:  Hypokalemia  Fatigue, unspecified type  Abdominal distention    Rx / DC Orders ED Discharge Orders     None         Melene Plan,  DO 01/17/23 2209

## 2023-01-17 NOTE — Telephone Encounter (Signed)
Patient's son is calling states she is still going to the bathroom having to stay on the toilet because it is so often, either much or dark and watery. States Dr Timothy Lasso is able to do the lab work for her tomorrow when she comes for her physical if she is able to make it to his office, wondering if there is a way to help slow down her BM to allow her to make it to the office. Please advise

## 2023-01-17 NOTE — Assessment & Plan Note (Signed)
Cont prn xanax

## 2023-01-18 DIAGNOSIS — Z79899 Other long term (current) drug therapy: Secondary | ICD-10-CM | POA: Diagnosis not present

## 2023-01-18 DIAGNOSIS — K5900 Constipation, unspecified: Secondary | ICD-10-CM

## 2023-01-18 DIAGNOSIS — Z87891 Personal history of nicotine dependence: Secondary | ICD-10-CM | POA: Diagnosis not present

## 2023-01-18 DIAGNOSIS — K6289 Other specified diseases of anus and rectum: Secondary | ICD-10-CM

## 2023-01-18 DIAGNOSIS — I4891 Unspecified atrial fibrillation: Secondary | ICD-10-CM | POA: Diagnosis not present

## 2023-01-18 DIAGNOSIS — C7800 Secondary malignant neoplasm of unspecified lung: Secondary | ICD-10-CM | POA: Diagnosis present

## 2023-01-18 DIAGNOSIS — I080 Rheumatic disorders of both mitral and aortic valves: Secondary | ICD-10-CM | POA: Diagnosis present

## 2023-01-18 DIAGNOSIS — Z515 Encounter for palliative care: Secondary | ICD-10-CM | POA: Diagnosis not present

## 2023-01-18 DIAGNOSIS — E1141 Type 2 diabetes mellitus with diabetic mononeuropathy: Secondary | ICD-10-CM | POA: Diagnosis present

## 2023-01-18 DIAGNOSIS — F419 Anxiety disorder, unspecified: Secondary | ICD-10-CM | POA: Diagnosis present

## 2023-01-18 DIAGNOSIS — Z79890 Hormone replacement therapy (postmenopausal): Secondary | ICD-10-CM | POA: Diagnosis not present

## 2023-01-18 DIAGNOSIS — D509 Iron deficiency anemia, unspecified: Secondary | ICD-10-CM | POA: Diagnosis present

## 2023-01-18 DIAGNOSIS — C20 Malignant neoplasm of rectum: Secondary | ICD-10-CM | POA: Diagnosis present

## 2023-01-18 DIAGNOSIS — Z7901 Long term (current) use of anticoagulants: Secondary | ICD-10-CM | POA: Diagnosis not present

## 2023-01-18 DIAGNOSIS — Z7189 Other specified counseling: Secondary | ICD-10-CM | POA: Diagnosis not present

## 2023-01-18 DIAGNOSIS — K5730 Diverticulosis of large intestine without perforation or abscess without bleeding: Secondary | ICD-10-CM | POA: Diagnosis not present

## 2023-01-18 DIAGNOSIS — K219 Gastro-esophageal reflux disease without esophagitis: Secondary | ICD-10-CM | POA: Diagnosis present

## 2023-01-18 DIAGNOSIS — I480 Paroxysmal atrial fibrillation: Secondary | ICD-10-CM | POA: Diagnosis present

## 2023-01-18 DIAGNOSIS — E440 Moderate protein-calorie malnutrition: Secondary | ICD-10-CM | POA: Diagnosis present

## 2023-01-18 DIAGNOSIS — J9601 Acute respiratory failure with hypoxia: Secondary | ICD-10-CM | POA: Diagnosis not present

## 2023-01-18 DIAGNOSIS — I495 Sick sinus syndrome: Secondary | ICD-10-CM | POA: Diagnosis present

## 2023-01-18 DIAGNOSIS — E785 Hyperlipidemia, unspecified: Secondary | ICD-10-CM | POA: Diagnosis present

## 2023-01-18 DIAGNOSIS — K625 Hemorrhage of anus and rectum: Secondary | ICD-10-CM

## 2023-01-18 DIAGNOSIS — C9591 Leukemia, unspecified, in remission: Secondary | ICD-10-CM | POA: Diagnosis present

## 2023-01-18 DIAGNOSIS — E876 Hypokalemia: Secondary | ICD-10-CM | POA: Diagnosis present

## 2023-01-18 DIAGNOSIS — K5669 Other partial intestinal obstruction: Secondary | ICD-10-CM | POA: Diagnosis not present

## 2023-01-18 DIAGNOSIS — E039 Hypothyroidism, unspecified: Secondary | ICD-10-CM | POA: Diagnosis present

## 2023-01-18 DIAGNOSIS — I700 Atherosclerosis of aorta: Secondary | ICD-10-CM | POA: Diagnosis present

## 2023-01-18 DIAGNOSIS — D519 Vitamin B12 deficiency anemia, unspecified: Secondary | ICD-10-CM | POA: Diagnosis present

## 2023-01-18 DIAGNOSIS — I10 Essential (primary) hypertension: Secondary | ICD-10-CM | POA: Diagnosis present

## 2023-01-18 LAB — CBC
HCT: 29.8 % — ABNORMAL LOW (ref 36.0–46.0)
Hemoglobin: 9.5 g/dL — ABNORMAL LOW (ref 12.0–15.0)
MCH: 26.5 pg (ref 26.0–34.0)
MCHC: 31.9 g/dL (ref 30.0–36.0)
MCV: 83.2 fL (ref 80.0–100.0)
Platelets: 236 10*3/uL (ref 150–400)
RBC: 3.58 MIL/uL — ABNORMAL LOW (ref 3.87–5.11)
RDW: 13.9 % (ref 11.5–15.5)
WBC: 4.9 10*3/uL (ref 4.0–10.5)
nRBC: 0 % (ref 0.0–0.2)

## 2023-01-18 LAB — COMPREHENSIVE METABOLIC PANEL
ALT: 16 U/L (ref 0–44)
AST: 14 U/L — ABNORMAL LOW (ref 15–41)
Albumin: 3 g/dL — ABNORMAL LOW (ref 3.5–5.0)
Alkaline Phosphatase: 50 U/L (ref 38–126)
Anion gap: 5 (ref 5–15)
BUN: 9 mg/dL (ref 8–23)
CO2: 28 mmol/L (ref 22–32)
Calcium: 8 mg/dL — ABNORMAL LOW (ref 8.9–10.3)
Chloride: 103 mmol/L (ref 98–111)
Creatinine, Ser: 0.62 mg/dL (ref 0.44–1.00)
GFR, Estimated: >60 mL/min (ref >60)
Glucose, Bld: 123 mg/dL — ABNORMAL HIGH (ref 70–99)
Potassium: 2.9 mmol/L — ABNORMAL LOW (ref 3.5–5.1)
Sodium: 136 mmol/L (ref 135–145)
Total Bilirubin: 0.8 mg/dL (ref 0.3–1.2)
Total Protein: 5.6 g/dL — ABNORMAL LOW (ref 6.5–8.1)

## 2023-01-18 LAB — PHOSPHORUS: Phosphorus: 3.2 mg/dL (ref 2.5–4.6)

## 2023-01-18 LAB — GLUCOSE, CAPILLARY
Glucose-Capillary: 114 mg/dL — ABNORMAL HIGH (ref 70–99)
Glucose-Capillary: 117 mg/dL — ABNORMAL HIGH (ref 70–99)
Glucose-Capillary: 119 mg/dL — ABNORMAL HIGH (ref 70–99)
Glucose-Capillary: 147 mg/dL — ABNORMAL HIGH (ref 70–99)
Glucose-Capillary: 214 mg/dL — ABNORMAL HIGH (ref 70–99)
Glucose-Capillary: 91 mg/dL (ref 70–99)

## 2023-01-18 LAB — TSH: TSH: 0.831 u[IU]/mL (ref 0.350–4.500)

## 2023-01-18 LAB — VITAMIN B12: Vitamin B-12: 158 pg/mL — ABNORMAL LOW (ref 180–914)

## 2023-01-18 LAB — FERRITIN: Ferritin: 5 ng/mL — ABNORMAL LOW (ref 11–307)

## 2023-01-18 LAB — IRON AND TIBC
Iron: 25 ug/dL — ABNORMAL LOW (ref 28–170)
Saturation Ratios: 7 % — ABNORMAL LOW (ref 10.4–31.8)
TIBC: 378 ug/dL (ref 250–450)
UIBC: 353 ug/dL

## 2023-01-18 LAB — HEMOGLOBIN A1C
Hgb A1c MFr Bld: 6.6 % — ABNORMAL HIGH (ref 4.8–5.6)
Mean Plasma Glucose: 142.72 mg/dL

## 2023-01-18 LAB — PREALBUMIN: Prealbumin: 14 mg/dL — ABNORMAL LOW (ref 18–38)

## 2023-01-18 LAB — MAGNESIUM: Magnesium: 1.6 mg/dL — ABNORMAL LOW (ref 1.7–2.4)

## 2023-01-18 LAB — POTASSIUM: Potassium: 3.6 mmol/L (ref 3.5–5.1)

## 2023-01-18 LAB — OSMOLALITY: Osmolality: 291 mOsm/kg (ref 275–295)

## 2023-01-18 LAB — SODIUM, URINE, RANDOM: Sodium, Ur: 70 mmol/L

## 2023-01-18 LAB — OSMOLALITY, URINE: Osmolality, Ur: 323 mOsm/kg (ref 300–900)

## 2023-01-18 LAB — FOLATE: Folate: 11.8 ng/mL (ref >5.9)

## 2023-01-18 LAB — LACTIC ACID, PLASMA: Lactic Acid, Venous: 0.9 mmol/L (ref 0.5–1.9)

## 2023-01-18 MED ORDER — GUAIFENESIN-DM 100-10 MG/5ML PO SYRP
10.0000 mL | ORAL_SOLUTION | ORAL | Status: DC | PRN
  Administered 2023-01-23 – 2023-01-26 (×4): 10 mL via ORAL
  Filled 2023-01-18 (×4): qty 10

## 2023-01-18 MED ORDER — ACETAMINOPHEN 650 MG RE SUPP: 650.0000 mg | Freq: Four times a day (QID) | RECTAL | Status: DC | PRN

## 2023-01-18 MED ORDER — MAGNESIUM SULFATE 2 GM/50ML IV SOLN
2.0000 g | Freq: Once | INTRAVENOUS | Status: AC
  Administered 2023-01-18: 2 g via INTRAVENOUS
  Filled 2023-01-18: qty 50

## 2023-01-18 MED ORDER — ATORVASTATIN CALCIUM 10 MG PO TABS
20.0000 mg | ORAL_TABLET | Freq: Every day | ORAL | Status: DC
  Administered 2023-01-18 – 2023-01-28 (×11): 20 mg via ORAL
  Filled 2023-01-18 (×12): qty 2

## 2023-01-18 MED ORDER — PANTOPRAZOLE SODIUM 40 MG PO TBEC
40.0000 mg | DELAYED_RELEASE_TABLET | Freq: Every day | ORAL | Status: DC
  Administered 2023-01-18 – 2023-01-28 (×11): 40 mg via ORAL
  Filled 2023-01-18 (×12): qty 1

## 2023-01-18 MED ORDER — SODIUM CHLORIDE 0.9 % IV SOLN
250.0000 mg | Freq: Every day | INTRAVENOUS | Status: AC
  Administered 2023-01-18 – 2023-01-19 (×2): 250 mg via INTRAVENOUS
  Filled 2023-01-18 (×2): qty 20

## 2023-01-18 MED ORDER — HYDROCODONE-ACETAMINOPHEN 5-325 MG PO TABS
1.0000 | ORAL_TABLET | ORAL | Status: DC | PRN
  Administered 2023-01-18 – 2023-01-20 (×4): 1 via ORAL
  Administered 2023-01-21 – 2023-01-22 (×2): 2 via ORAL
  Administered 2023-01-22: 1 via ORAL
  Administered 2023-01-25 – 2023-01-26 (×2): 2 via ORAL
  Filled 2023-01-18 (×2): qty 2
  Filled 2023-01-18: qty 1
  Filled 2023-01-18: qty 2
  Filled 2023-01-18: qty 1
  Filled 2023-01-18: qty 2
  Filled 2023-01-18 (×3): qty 1

## 2023-01-18 MED ORDER — ALBUTEROL SULFATE (2.5 MG/3ML) 0.083% IN NEBU
2.5000 mg | INHALATION_SOLUTION | RESPIRATORY_TRACT | Status: DC | PRN
  Administered 2023-01-20: 2.5 mg via RESPIRATORY_TRACT
  Filled 2023-01-18: qty 3

## 2023-01-18 MED ORDER — POTASSIUM CHLORIDE 20 MEQ PO PACK
60.0000 meq | PACK | Freq: Once | ORAL | Status: AC
  Administered 2023-01-18: 60 meq via ORAL
  Filled 2023-01-18: qty 3

## 2023-01-18 MED ORDER — ONDANSETRON HCL 4 MG PO TABS
4.0000 mg | ORAL_TABLET | Freq: Four times a day (QID) | ORAL | Status: DC | PRN
  Administered 2023-01-22 – 2023-01-23 (×2): 4 mg via ORAL
  Filled 2023-01-18 (×2): qty 1

## 2023-01-18 MED ORDER — SODIUM CHLORIDE 0.9 % IV SOLN: INTRAVENOUS | Status: DC

## 2023-01-18 MED ORDER — ONDANSETRON HCL 4 MG/2ML IJ SOLN
4.0000 mg | Freq: Four times a day (QID) | INTRAMUSCULAR | Status: DC | PRN
  Administered 2023-01-22 – 2023-01-24 (×2): 4 mg via INTRAVENOUS
  Filled 2023-01-18: qty 2

## 2023-01-18 MED ORDER — SIMETHICONE 80 MG PO CHEW
80.0000 mg | CHEWABLE_TABLET | Freq: Four times a day (QID) | ORAL | Status: AC | PRN
  Administered 2023-01-18 (×2): 80 mg via ORAL
  Filled 2023-01-18 (×2): qty 1

## 2023-01-18 MED ORDER — ACETAMINOPHEN 325 MG PO TABS
650.0000 mg | ORAL_TABLET | Freq: Four times a day (QID) | ORAL | Status: DC | PRN
  Administered 2023-01-18 – 2023-01-27 (×5): 650 mg via ORAL
  Filled 2023-01-18 (×5): qty 2

## 2023-01-18 MED ORDER — ALPRAZOLAM 0.5 MG PO TABS
0.5000 mg | ORAL_TABLET | Freq: Three times a day (TID) | ORAL | Status: DC | PRN
  Administered 2023-01-18 – 2023-01-20 (×7): 0.5 mg via ORAL
  Filled 2023-01-18 (×8): qty 1

## 2023-01-18 MED ORDER — LEVOTHYROXINE SODIUM 125 MCG PO TABS
125.0000 ug | ORAL_TABLET | Freq: Every day | ORAL | Status: DC
  Administered 2023-01-18 – 2023-01-28 (×10): 125 ug via ORAL
  Filled 2023-01-18 (×10): qty 1

## 2023-01-18 MED ORDER — BOOST / RESOURCE BREEZE PO LIQD CUSTOM
1.0000 | Freq: Three times a day (TID) | ORAL | Status: DC
  Administered 2023-01-18 – 2023-01-23 (×14): 1 via ORAL
  Filled 2023-01-18: qty 1

## 2023-01-18 MED ORDER — METHOCARBAMOL 500 MG PO TABS
500.0000 mg | ORAL_TABLET | Freq: Three times a day (TID) | ORAL | Status: DC | PRN
  Administered 2023-01-18 – 2023-01-22 (×3): 500 mg via ORAL
  Filled 2023-01-18 (×3): qty 1

## 2023-01-18 MED ORDER — POLYETHYLENE GLYCOL 3350 17 G PO PACK
17.0000 g | PACK | Freq: Every day | ORAL | Status: DC
  Administered 2023-01-18 – 2023-01-26 (×6): 17 g via ORAL
  Filled 2023-01-18 (×7): qty 1

## 2023-01-18 MED ORDER — POTASSIUM CHLORIDE 10 MEQ/100ML IV SOLN
10.0000 meq | Freq: Once | INTRAVENOUS | Status: AC
  Administered 2023-01-18: 10 meq via INTRAVENOUS
  Filled 2023-01-18: qty 100

## 2023-01-18 NOTE — Telephone Encounter (Signed)
Patient admitted to hospital.

## 2023-01-18 NOTE — Progress Notes (Signed)
Initial Nutrition Assessment  DOCUMENTATION CODES:   Non-severe (moderate) malnutrition in context of acute illness/injury  INTERVENTION:   -Boost Breeze po TID, each supplement provides 250 kcal and 9 grams of protein   -Recommend Vitamin B-12 supplements given low labs  NUTRITION DIAGNOSIS:   Moderate Malnutrition related to acute illness (abdominal pain) as evidenced by mild fat depletion, mild muscle depletion.  GOAL:   Patient will meet greater than or equal to 90% of their needs  MONITOR:   PO intake, Supplement acceptance, Labs, Weight trends, I & O's, Diet advancement  REASON FOR ASSESSMENT:   Consult Assessment of nutrition requirement/status  ASSESSMENT:   89 year old female with history of intermittent GI bleed secondary to AVMs, GERD, hypothyroidism who presented to the emergency department for the complaint of worsening abdominal pain, distention, nausea, vomiting, bright red blood per rectum.  Patient in room, son's fiancee at bedside. Pt reports poor appetite. Per family, pt doesn't typically eat a lot normally but since 5/6 has not eaten at all d/t symptom onset (abdominal pain). Pt did eat a piece of toast yesterday. Now on clear liquids and has not had any yet. Pt has drank Equate versions of Ensure before but does not drink them consistently. Agreeable to [facility] while on clears. Pt states she was told by her doctor to not take a MVI. She does take Vitamin D.   Per family they believe pt has lost weight. UBW ~147 lbs. Pt likely needs new weight for admission as this is admission weight.   Medications: Miralax, KLOR-CON, Ferrlecit, IV Mg sulfate, IV KCl  Labs reviewed:   CBGs: 114-119 Low K Low Mg Low iron Low Vitamin B-12  NUTRITION - FOCUSED PHYSICAL EXAM:  Flowsheet Row Most Recent Value  Orbital Region Mild depletion  Upper Arm Region Mild depletion  Thoracic and Lumbar Region Unable to assess  Buccal Region Mild depletion  Temple Region  Mild depletion  Clavicle Bone Region Mild depletion  Clavicle and Acromion Bone Region Mild depletion  Scapular Bone Region Mild depletion  Dorsal Hand Mild depletion  Patellar Region Mild depletion  Anterior Thigh Region Mild depletion  Posterior Calf Region Mild depletion  Edema (RD Assessment) None  Hair Reviewed  Eyes Reviewed  Mouth Reviewed  [no teeth]  Skin Reviewed  [pale]       Diet Order:   Diet Order             Diet clear liquid Room service appropriate? Yes; Fluid consistency: Thin  Diet effective now                   EDUCATION NEEDS:   No education needs have been identified at this time  Skin:  Skin Assessment: Reviewed RN Assessment  Last BM:  5/9  Height:   Ht Readings from Last 1 Encounters:  01/17/23 5' (1.524 m)    Weight:   Wt Readings from Last 1 Encounters:  01/17/23 66.7 kg    BMI:  Body mass index is 28.71 kg/m.  Estimated Nutritional Needs:   Kcal:  1600-1800  Protein:  70-80g  Fluid:  1.8L/day   Tilda Franco, MS, RD, LDN Inpatient Clinical Dietitian Contact information available via Amion

## 2023-01-18 NOTE — Evaluation (Signed)
Physical Therapy Evaluation Patient Details Name: [name] MRN: [id number] DOB: 1929-01-26 Today's Date: 01/18/2023  History of Present Illness  Patient is 89 y.o. female presented with frequent bowel movements abdominal pain. Son caled GI as patient was developing abdominal pain and distention noted to have abdominal bulge above umbilicus, GI recommended patient going to ED.   Clinical Impression  [name] is 89 y.o. female admitted with above HPI and diagnosis. Patient is currently limited by functional impairments below (see PT problem list). Patient lives with her son and is independent at baseline. Currently pt requires min guard/assist for transfers and gait. Pt's balance much improved with Bil UE support of RW as she tends to reach for furniture/external support without AD. Pt continues to have rectal bleeding and RN notified of bleeding and tissue present from attempted BM. Patient will benefit from continued skilled PT interventions to address impairments and progress independence with mobility. Acute PT will follow and progress as able.        Recommendations for follow up therapy are one component of a multi-disciplinary discharge planning process, led by the attending physician.  Recommendations may be updated based on patient status, additional functional criteria and insurance authorization.  Follow Up Recommendations       Assistance Recommended at Discharge Frequent or constant Supervision/Assistance  Patient can return home with the following  A little help with walking and/or transfers;A little help with bathing/dressing/bathroom;Assistance with cooking/housework;Direct supervision/assist for medications management;Assist for transportation;Help with stairs or ramp for entrance    Equipment Recommendations None recommended by PT  Recommendations for Other Services       Functional Status Assessment Patient has had a recent decline in their functional status and  demonstrates the ability to make significant improvements in function in a reasonable and predictable amount of time.     Precautions / Restrictions Precautions Precautions: Fall Restrictions Weight Bearing Restrictions: No      Mobility  Bed Mobility Overal bed mobility: Needs Assistance Bed Mobility: Supine to Sit     Supine to sit: Supervision, HOB elevated     General bed mobility comments: use of bed features and extra time    Transfers Overall transfer level: Needs assistance Equipment used: 1 person hand held assist, Rolling walker (2 wheels) Transfers: Sit to/from Stand Sit to Stand: Min guard, Min assist           General transfer comment: HHA for 1x sit<>stand from EOB. Min guard for stand to RW from EOB and toilet.    Ambulation/Gait Ambulation/Gait assistance: Min guard, Min assist Gait Distance (Feet): 30 Feet Assistive device: 1 person hand held assist, Rolling walker (2 wheels) Gait Pattern/deviations: Step-through pattern, Decreased step length - right, Decreased step length - left, Decreased stride length, Shuffle, Trunk flexed Gait velocity: decr     General Gait Details: Patient requried min assist to ambulate short bout around bed, denied dizziness with gait. Min guard with use of RW for gait to bathroom and back. balance greatly improved with use of RW.  Stairs            Wheelchair Mobility    Modified Rankin (Stroke Patients Only)       Balance                                             Pertinent Vitals/Pain Pain Assessment Pain  Assessment: No/denies pain    Home Living Family/patient expects to be discharged to:: Private residence Living Arrangements: Children Available Help at Discharge: Family Type of Home: House Home Access: Stairs to enter Entrance Stairs-Rails: Can reach both Entrance Stairs-Number of Steps: 3   Home Layout: One level Home Equipment: Agricultural consultant (2 wheels);Shower seat;Cane  - single point;BSC/3in1;Grab bars - tub/shower;Hand held shower head      Prior Function Prior Level of Function : Independent/Modified Independent                     Hand Dominance   Dominant Hand: Right    Extremity/Trunk Assessment   Upper Extremity Assessment Upper Extremity Assessment: Generalized weakness    Lower Extremity Assessment Lower Extremity Assessment: Generalized weakness    Cervical / Trunk Assessment Cervical / Trunk Assessment: Kyphotic  Communication   Communication: No difficulties  Cognition Arousal/Alertness: Awake/alert Behavior During Therapy: WFL for tasks assessed/performed Overall Cognitive Status: Within Functional Limits for tasks assessed                                          General Comments      Exercises     Assessment/Plan    PT Assessment Patient needs continued PT services  PT Problem List Decreased strength;Decreased activity tolerance;Decreased balance;Decreased mobility;Decreased knowledge of use of DME;Decreased safety awareness;Pain       PT Treatment Interventions DME instruction;Gait training;Stair training;Functional mobility training;Therapeutic activities;Therapeutic exercise;Balance training;Neuromuscular re-education;Cognitive remediation;Patient/family education;Wheelchair mobility training    PT Goals (Current goals can be found in the Care Plan section)  Acute Rehab PT Goals Patient Stated Goal: get home and figure out source of pain PT Goal Formulation: With patient Time For Goal Achievement: 02/01/23 Potential to Achieve Goals: Good    Frequency Min 1X/week     Co-evaluation               AM-PAC PT "6 Clicks" Mobility  Outcome Measure Help needed turning from your back to your side while in a flat bed without using bedrails?: A Little Help needed moving from lying on your back to sitting on the side of a flat bed without using bedrails?: A Little Help needed moving  to and from a bed to a chair (including a wheelchair)?: A Little Help needed standing up from a chair using your arms (e.g., wheelchair or bedside chair)?: A Little Help needed to walk in hospital room?: A Little Help needed climbing 3-5 steps with a railing? : A Lot 6 Click Score: 17    End of Session Equipment Utilized During Treatment: Gait belt Activity Tolerance: Patient tolerated treatment well Patient left: with call bell/phone within reach;in chair Nurse Communication: Mobility status PT Visit Diagnosis: Unsteadiness on feet (R26.81);Difficulty in walking, not elsewhere classified (R26.2);Other abnormalities of gait and mobility (R26.89);Muscle weakness (generalized) (M62.81);Pain Pain - Right/Left: Right Pain - part of body:  (abdominal)    Time: 1610-9604 PT Time Calculation (min) (ACUTE ONLY): 36 min   Charges:   PT Evaluation $PT Eval Moderate Complexity: 1 Mod PT Treatments $Gait Training: 8-22 mins        Wynn Maudlin, DPT Acute Rehabilitation Services Office [phone]  01/18/23 4:34 PM

## 2023-01-18 NOTE — Progress Notes (Signed)
PROGRESS NOTE  [name]  WUJ:[id number] DOB: 05/12/1929 DOA: 01/17/2023 PCP: Creola Corn, MD   Brief Narrative: Patient is a 89 year old female with history of intermittent GI bleed secondary to AVMs, GERD, hypothyroidism who presented to the emergency department for the complaint of worsening abdominal pain, distention, nausea, vomiting, bright red blood per rectum.  Patient's family called gastroenterology office and was suggested to go to the emergency department.  Also reported to have frequent bowel movement after she was recently started on MiraLAX.  Has history of small bowel/colon AVMs.  Noted to have blood on the toilet paper.  On presentation, she was hemodynamically stable.  Lab work showed severe hypokalemia with potassium of 2.5, magnesium of 1.6, hemoglobin of 11.4.  Anemia panel showed low iron of 25.  GI consulted.  Assessment & Plan:  Principal Problem:   Hypokalemia Active Problems:   Symptomatic anemia   Hyperlipidemia   Anxiety   Stroke (HCC)   Hypothyroidism   Diabetes mellitus without complication (HCC)   GIB (gastrointestinal bleeding)   Acute respiratory failure with hypoxia (HCC)   Hypomagnesemia   Periumbilical hernia   Abdominal pain/nausea/vomiting: CT abdomen/pelvis did not show any acute findings. History of chronic periumbilical hernia, no evidence of incarceration. Patient was seen at GI office recently  but was found to have constipated and was started on MiraLAX.  After MiraLAX was started, she started having and developed abdominal pain. She complains of pain on the lower right lower quadrant but she does not have tenderness when she is distracted  Concern for lower GI bleed/iron deficiency:History of AVMs,hemorrhoids. Report of blood red blood per rectum, seen on toilet paper. hemoglobin was stable in the range of 11 on presentation, has slightly trended down to 9.  Continue to monitor. Anemia panel showed low iron, given IV iron.  Monitor  hemoglobin  Hypokalemia/hypomagnesemia: Currently being monitored and supplemented  Respiratory insufficiency: Not on oxygen at home.  Currently requiring 2 L of oxygen.  Chest x-ray  did not show any acute findings,showed chronic  interstitial coarsening in the lower lungs .  Will attempt to monitor on room air.  She is a past smoker  Hyperlipidemia: On Lipitor  Hypothyroidism: On Synthyroid         DVT prophylaxis:SCDs Start: 01/18/23 0111     Code Status: Full Code  Family Communication: Family member at bedside  Patient status: Inpatient  Patient is from : Home  Anticipated discharge to: Home  Estimated DC date: 1 to 2 days, after GI clearance and workup   Consultants: GI  Procedures:None  Antimicrobials:  Anti-infectives (From admission, onward)    None       Subjective:  Patient seen and examined the bedside today.  Family member at bedside.  She looked overall comfortable.  She was on 2 L of oxygen.  Has some cough.  Complains of discomfort on the right lower quadrant.  Objective: Vitals:   01/17/23 2240 01/17/23 2309 01/18/23 0106 01/18/23 0528  BP:   (!) 153/65 (!) 134/52  Pulse: 88  82 70  Resp: 20  18 15   Temp:  98.1 F (36.7 C) 97.9 F (36.6 C) (!) 97.5 F (36.4 C)  TempSrc:  Oral Oral Oral  SpO2: 97%  94% 96%  Weight:      Height:        Intake/Output Summary (Last 24 hours) at 01/18/2023 0814 Last data filed at 01/18/2023 0537 Gross per 24 hour  Intake 499.13 ml  Output --  Net 499.13 ml   Filed Weights   01/17/23 1914  Weight: 66.7 kg    Examination:  General exam: Overall comfortable, not in distress, pleasant elderly female HEENT: PERRL Respiratory system: Mild diminished air sounds on bilateral bases, no wheezes or crackles  Cardiovascular system: S1 & S2 heard, RRR.  Gastrointestinal system: Abdomen is mildly distended, soft and nontender. Central nervous system: Alert and oriented Extremities: No edema, no clubbing ,no  cyanosis Skin: No rashes, no ulcers,no icterus     Data Reviewed: I have personally reviewed following labs and imaging studies  CBC: Recent Labs  Lab 01/15/23 1230 01/17/23 1940 01/18/23 0532  WBC 8.0 5.5 4.9  NEUTROABS 4.4 3.2  --   HGB 12.2 11.4* 9.5*  HCT 36.4 34.6* 29.8*  MCV 78.8 80.5 83.2  PLT 299.0 267 236   Basic Metabolic Panel: Recent Labs  Lab 01/17/23 1940 01/17/23 1948 01/18/23 0532  NA 134*  --  136  K 2.5*  --  2.9*  CL 96*  --  103  CO2 28  --  28  GLUCOSE 203*  --  123*  BUN 12  --  9  CREATININE 0.69  --  0.62  CALCIUM 8.7*  --  8.0*  MG  --  1.6* 1.6*  PHOS  --  3.1 3.2     No results found for this or any previous visit (from the past 240 hour(s)).   Radiology Studies: DG Chest Port 1 View  Result Date: 01/17/2023 CLINICAL DATA:  Chest pain EXAM: PORTABLE CHEST 1 VIEW COMPARISON:  12/05/2017 FINDINGS: Stable cardiomediastinal silhouette. Aortic atherosclerotic calcification. Diffuse interstitial coarsening greatest in the lower lungs. No focal consolidation, pleural effusion, pneumothorax. No definite displaced rib fractures. IMPRESSION: No active disease. Interstitial coarsening in the lower lungs is similar to 12/05/2017. Electronically Signed   By: Minerva Fester M.D.   On: 01/17/2023 21:24   CT ABDOMEN PELVIS W CONTRAST  Result Date: 01/17/2023 CLINICAL DATA:  Abdominal pain and distention. Side GI on Monday and was impacted and given laxatives. Has had multiple bowel movements since. EXAM: CT ABDOMEN AND PELVIS WITH CONTRAST TECHNIQUE: Multidetector CT imaging of the abdomen and pelvis was performed using the standard protocol following bolus administration of intravenous contrast. RADIATION DOSE REDUCTION: This exam was performed according to the departmental dose-optimization program which includes automated exposure control, adjustment of the mA and/or kV according to patient size and/or use of iterative reconstruction technique. CONTRAST:   80mL OMNIPAQUE IOHEXOL 300 MG/ML  SOLN COMPARISON:  Abdominal ultrasound 07/12/2017 and report from CT abdomen and pelvis 01/20/2019 FINDINGS: Lower chest: No acute abnormality. Hepatobiliary: Cholecystectomy. No focal hepatic lesion. Post cholecystectomy prominence of the common bile duct. Pancreas: Unremarkable. Spleen: Unremarkable. Adrenals/Urinary Tract: Normal adrenal glands. No urinary calculi or hydronephrosis. Unremarkable bladder. Stomach/Bowel: Normal caliber large and small bowel. No bowel wall thickening. The appendix is not visualized. Stomach is within normal limits. Duodenal diverticulum. Vascular/Lymphatic: Aortic atherosclerotic calcification. Calcification at the origins of the celiac axis and SMA cause moderate narrowing. 1.9 cm left common iliac artery ectasia. No lymphadenopathy. Reproductive: Uterus and bilateral adnexa are unremarkable. Other: No free intraperitoneal fluid or air. Musculoskeletal: No acute fracture. IMPRESSION: 1. No acute findings in the abdomen or pelvis. Aortic Atherosclerosis (ICD10-I70.0). Electronically Signed   By: Minerva Fester M.D.   On: 01/17/2023 21:06    Scheduled Meds:  atorvastatin  20 mg Oral Daily   insulin aspart  0-9 Units Subcutaneous Q4H   levothyroxine  125  mcg Oral Q0600   pantoprazole  40 mg Oral Daily   Continuous Infusions:  sodium chloride 75 mL/hr at 01/18/23 0115   ferric gluconate (FERRLECIT) IVPB       LOS: 0 days   Burnadette Pop, MD [facility] P5/05/2023, 8:14 AM

## 2023-01-18 NOTE — H&P (View-Only) (Signed)
                                             Consultation Note   Referring Provider:  [facility] PCP: Russo, John, MD Primary Gastroenterologist:  Christina Morioka, MD        Reason for consultation: abdominal pain, scant rectal bleeding  DOA: 01/17/2023        Hospital Day: 2  Brief Narrative:  Patient is a 89 y.o. year old female whose past medical history includes but is not necessarily limited to iron deficiency anemia, small bowel AVMs, colon AVM, adenomatous colon polyps, DM2, hypertension, CVA , hypothyroidism  cholecystectomy and appendectomy  Assessment and Plan   89 yo female with intermittent rectal bleeding and bowel changes. Evaluated in our office 5/6. Had fecal impaction on exam.  Given bowel purge which was successful but now admitted with abdominal pain. Suspect the mushy / liquid stools were overflow related to constipation. The bleeding may be from internal hemorrhoids but anterior rectal lesion felt on DRE today ( could be hard stool but was bleeding).  Additionally appears to have a small posterior midline fissure. No masses on CT scan is reassuring -She is reluctant to undergo lower endoscopy and given age / co-morbidities this is understandable. Will consider a barium enema or may unsedated anoscopy  -Diltiazem ointment for anal fissure is not on formulary. For now will use topical lidocaine for discomfort.  -Daily miralax  Hypokalemia Repletion in progress   Attending Physician Note   I have taken a history, reviewed the chart and examined the patient. I performed a substantive portion of this encounter, including complete performance of at least one of the key components, in conjunction with the APP. I agree with the APP's note, impression and recommendations with my edits. My additional impressions and recommendations are as follows.   Intermittent hematochezia and new constipation. Recent fecal impaction, resolving. New abdominal pain has improved, CT AP  unremarkable, etiology unclear. Firm posterior and lateral left rectal mass on DRE. Continue Miralax daily.  She does not want to under a colonoscopy with a full bowel prep.  She agrees to a flexible sigmoidoscopy with enema prep.   Christina Noboa, MD FACG See [facility], [facility], for our on call provider     History of Present Illness [name] was seen in our office 01/15/23 for rectal bleeding and an anemia. She had some mild rectal discomfort at times, especially when sitting for prolonged periods of time. She described her stools as mushy to liquid in consistency. Prior to few months ago her stools were soft formed. She was uncertain how low the bleeding had been going on but possibly 2-3 months. She wasn't having any abdominal pain, nausea/ vomiting or other GI symptoms. Rectal exam was attempted but she hard a lot of hard stool in vault precluding DRE or anoscopy. Given 1/2 of a bowel purge, treated empirically for hemorrhoids and CBD checked. Hgb was 12.2 ( baseline 10.2).  Son contacted our on call MD last night. He was inquiring about a new bulge above navel and spoke  The bulge was reducible, not really painful.  We recommended patient go ahead and take the remaining 1/2 of bowel prep and our office would arrange for a non-urgent CT scan.   Son called answering service back last night. Patient had developed abdominal pain   and distention. She had had multiple BMs following bowel purge. We advised ED evaluation.    ED evaluation:  Afebrile. K+ was low at 2.5, Mg+ low. WBC normal , Hgb 9.5. Renal function, liver chemistries and lipase normal. U/A unremarkable. CT scan with contrast without any acute findings.  She was having transient episodes of hypoxia.   [name] began having RLQ discomfort yesterday. She was not having abdominal discomfort when I saw her in clinic a few days ago. Pain is intermittent. Continues to have the discomfort today. Had a small BM earlier this am. The  periumbilical knot described by son over the phone is apparently visible only when she stands   Imaging and Labs Recent Labs    01/15/23 1230 01/17/23 1940 01/18/23 0532  WBC 8.0 5.5 4.9  HGB 12.2 11.4* 9.5*  HCT 36.4 34.6* 29.8*  PLT 299.0 267 236   Recent Labs    01/17/23 1940 01/18/23 0532  NA 134* 136  K 2.5* 2.9*  CL 96* 103  CO2 28 28  GLUCOSE 203* 123*  BUN 12 9  CREATININE 0.69 0.62  CALCIUM 8.7* 8.0*   Recent Labs    01/18/23 0532  PROT 5.6*  ALBUMIN 3.0*  AST 14*  ALT 16  ALKPHOS 50  BILITOT 0.8    CT with contrast 01/17/23  CLINICAL DATA:  Abdominal pain and distention. Side GI on Monday and was impacted and given laxatives. Has had multiple bowel movements since.   EXAM: CT ABDOMEN AND PELVIS WITH CONTRAST   TECHNIQUE: Multidetector CT imaging of the abdomen and pelvis was performed using the standard protocol following bolus administration of intravenous contrast.   RADIATION DOSE REDUCTION: This exam was performed according to the departmental dose-optimization program which includes automated exposure control, adjustment of the mA and/or kV according to patient size and/or use of iterative reconstruction technique.   CONTRAST:  80mL OMNIPAQUE IOHEXOL 300 MG/ML  SOLN   COMPARISON:  Abdominal ultrasound 07/12/2017 and report from CT abdomen and pelvis 01/20/2019   FINDINGS: Lower chest: No acute abnormality.   Hepatobiliary: Cholecystectomy. No focal hepatic lesion. Post cholecystectomy prominence of the common bile duct.   Pancreas: Unremarkable.   Spleen: Unremarkable.   Adrenals/Urinary Tract: Normal adrenal glands. No urinary calculi or hydronephrosis. Unremarkable bladder.   Stomach/Bowel: Normal caliber large and small bowel. No bowel wall thickening. The appendix is not visualized. Stomach is within normal limits. Duodenal diverticulum.   Vascular/Lymphatic: Aortic atherosclerotic calcification. Calcification at the  origins of the celiac axis and SMA cause moderate narrowing. 1.9 cm left common iliac artery ectasia. No lymphadenopathy.   Reproductive: Uterus and bilateral adnexa are unremarkable.   Other: No free intraperitoneal fluid or air.   Musculoskeletal: No acute fracture.   IMPRESSION: 1. No acute findings in the abdomen or pelvis.   Aortic Atherosclerosis (ICD10-I70.0).     Electronically Signed   By: Tyler  Stutzman M.D.   On: 01/17/2023 21:06     Previous GI Evaluation:   March 2019 colonoscopy for rectal bleeding -- One 5 mm polyp in the transverse colon, removed with a cold snare. Resected and retrieved. - One 15 mm polyp in the very distal rectum, removed with a hot snare. Resected and retrieved. - One non-bleeding colonic angiodysplastic lesion. Treated with argon plasma coagulation (APC) for destruction, the lesion bled slightly during treatment. - The examination was otherwise normal on direct and retroflexion views. - The cecal AVM and/or the medium sized rectal polyp may have contributed   to her anemia. The rectal polyp almost certainly explains her minor rectal bleeding     1. Colon, polyp(s), Transverse - TUBULAR ADENOMA. - NO HIGH GRADE DYSPLASIA OR MALIGNANCY. 2. Rectum, polyp(s) - TUBULAR ADENOMA. - NO HIGH GRADE DYSPLASIA OR MALIGNANCY.   March 2019 EGD - One small duodenal bulb AVM was found and destroyed with APC applicaiton. - Small hiatal hernia without associated erosions. - The duodenal AVM may have contributed to her anemia.   Principal Problem:   Hypokalemia Active Problems:   Symptomatic anemia   Hyperlipidemia   Anxiety   Stroke (HCC)   Hypothyroidism   Diabetes mellitus without complication (HCC)   GIB (gastrointestinal bleeding)   Acute respiratory failure with hypoxia (HCC)   Hypomagnesemia   Periumbilical hernia   Past Medical History:  Diagnosis Date   Anemia    Anxiety    Colon polyps    DDD (degenerative disc disease), cervical     Diabetes mellitus    Diabetes mellitus, type 2 (HCC)    Hyperlipidemia    Hypertension    Hypothyroidism    Leukemia (HCC) in remission   Mucous retention cyst of maxillary sinus    Osteoporosis    Pinched nerve In neck   Plantar fasciitis    Stroke (HCC)    Vitamin D deficiency     Past Surgical History:  Procedure Laterality Date   APPENDECTOMY     CHOLECYSTECTOMY     2004   COLONOSCOPY N/A 02/24/2013   Procedure: COLONOSCOPY;  Surgeon: Gwendy Boeder T Tynetta Bachmann, MD;  Location: [facility];  Service: Endoscopy;  Laterality: N/A;   COLONOSCOPY W/ POLYPECTOMY     COLONOSCOPY WITH PROPOFOL N/A 12/03/2017   Procedure: COLONOSCOPY WITH PROPOFOL;  Surgeon: Jacobs, Daniel P, MD;  Location: [facility];  Service: Gastroenterology;  Laterality: N/A;   ESOPHAGOGASTRODUODENOSCOPY     ESOPHAGOGASTRODUODENOSCOPY N/A 02/24/2013   Procedure: ESOPHAGOGASTRODUODENOSCOPY (EGD);  Surgeon: Amery Minasyan T Deliyah Muckle, MD;  Location: [facility];  Service: Endoscopy;  Laterality: N/A;   ESOPHAGOGASTRODUODENOSCOPY (EGD) WITH PROPOFOL N/A 12/03/2017   Procedure: ESOPHAGOGASTRODUODENOSCOPY (EGD) WITH PROPOFOL;  Surgeon: Jacobs, Daniel P, MD;  Location: [facility];  Service: Gastroenterology;  Laterality: N/A;   EYE SURGERY     bilateral catracts    Family History  Problem Relation Age of Onset   Diabetes Other    Colon cancer Son    Esophageal cancer Neg Hx    Stomach cancer Neg Hx     Prior to Admission medications   Medication Sig Start Date End Date Taking? Authorizing Provider  ALPRAZolam (XANAX) 0.5 MG tablet Take 0.5 mg by mouth 3 (three) times daily as needed for anxiety. For anxiety   Yes [provider]  amLODipine (NORVASC) 2.5 MG tablet Take 10 mg by mouth daily.    Yes [provider]  atorvastatin (LIPITOR) 20 MG tablet Take 20 mg by mouth daily.   Yes [provider]  levothyroxine (SYNTHROID) 125 MCG tablet Take 125 mcg by mouth daily before breakfast.   Yes [provider]  olmesartan-hydrochlorothiazide (BENICAR HCT) 40-25 MG tablet Take 1 tablet by mouth daily. 10/15/17  Yes [provider]  pantoprazole (PROTONIX) 40 MG tablet Take 40 mg by mouth daily.   Yes [provider]  potassium chloride SA (K-DUR,KLOR-CON) 20 MEQ tablet Take 20 mEq by mouth daily.   Yes [provider]  albuterol (PROVENTIL HFA;VENTOLIN HFA) 108 (90 Base) MCG/ACT inhaler Inhale 1-2 puffs into the lungs every 6 (six) hours   as needed for wheezing or shortness of breath. Patient not taking: Reported on 01/17/2023 12/05/17   Campos, Kevin, MD  cholecalciferol (VITAMIN D3) 25 MCG (1000 UNIT) tablet Take 1,000 Units by mouth daily.    [provider]  Diclofenac Sodium CR 100 MG 24 hr tablet Take 1 tablet (100 mg total) by mouth daily. Patient not taking: Reported on 01/15/2023 06/29/20   Palumbo, April, MD  ferrous sulfate 325 (65 FE) MG EC tablet Take 1 tablet (325 mg total) by mouth daily with breakfast. Patient not taking: Reported on 01/15/2023 12/04/17   Adhikari, Amrit, MD  lidocaine (LIDODERM) 5 % Place 1 patch onto the skin daily. Remove & Discard patch within 12 hours or as directed by MD Patient not taking: Reported on 01/15/2023 06/29/20   Palumbo, April, MD  pantoprazole (PROTONIX) 40 MG tablet Take 1 tablet (40 mg total) by mouth daily. 12/04/17 01/15/23  Adhikari, Amrit, MD    Current Facility-Administered Medications  Medication Dose Route Frequency Provider Last Rate Last Admin   0.9 %  sodium chloride infusion   Intravenous Continuous Doutova, Anastassia, MD 75 mL/hr at 01/18/23 0115 New Bag at 01/18/23 0115   acetaminophen (TYLENOL) tablet 650 mg  650 mg Oral Q6H PRN Doutova, Anastassia, MD       Or   acetaminophen (TYLENOL) suppository 650 mg  650 mg Rectal Q6H PRN Doutova, Anastassia, MD       albuterol (PROVENTIL) (2.5 MG/3ML) 0.083% nebulizer solution 2.5 mg  2.5 mg Nebulization Q2H PRN Doutova, Anastassia, MD       ALPRAZolam  (XANAX) tablet 0.5 mg  0.5 mg Oral TID PRN Doutova, Anastassia, MD       atorvastatin (LIPITOR) tablet 20 mg  20 mg Oral Daily Doutova, Anastassia, MD       ferric gluconate (FERRLECIT) 250 mg in sodium chloride 0.9 % 250 mL IVPB  250 mg Intravenous Daily Adhikari, Amrit, MD       HYDROcodone-acetaminophen (NORCO/VICODIN) 5-325 MG per tablet 1-2 tablet  1-2 tablet Oral Q4H PRN Doutova, Anastassia, MD   1 tablet at 01/18/23 0130   insulin aspart (novoLOG) injection 0-9 Units  0-9 Units Subcutaneous Q4H Doutova, Anastassia, MD       levothyroxine (SYNTHROID) tablet 125 mcg  125 mcg Oral Q0600 Doutova, Anastassia, MD   125 mcg at 01/18/23 0534   ondansetron (ZOFRAN) tablet 4 mg  4 mg Oral Q6H PRN Doutova, Anastassia, MD       Or   ondansetron (ZOFRAN) injection 4 mg  4 mg Intravenous Q6H PRN Doutova, Anastassia, MD       pantoprazole (PROTONIX) EC tablet 40 mg  40 mg Oral Daily Doutova, Anastassia, MD       simethicone (MYLICON) chewable tablet 80 mg  80 mg Oral Q6H PRN Chavez, Abigail, NP   80 mg at 01/18/23 0215    Allergies as of 01/17/2023 - Review Complete 01/17/2023  Allergen Reaction Noted   Prednisone  09/27/2011    Social History   Socioeconomic History   Marital status: Widowed    Spouse name: Not on file   Number of children: 3   Years of education: Not on file   Highest education level: Not on file  Occupational History   Occupation: retired  Tobacco Use   Smoking status: Former   Smokeless tobacco: Never  Vaping Use   Vaping Use: Former  Substance and Sexual Activity   Alcohol use: No   Drug use: No   Sexual   activity: Not Currently  Other Topics Concern   Not on file  Social History Narrative   Not on file   Social Determinants of Health   Financial Resource Strain: Not on file  Food Insecurity: No Food Insecurity (01/18/2023)   Hunger Vital Sign    Worried About Running Out of Food in the Last Year: Never true    Ran Out of Food in the Last Year: Never true   Transportation Needs: No Transportation Needs (01/18/2023)   PRAPARE - Transportation    Lack of Transportation (Medical): No    Lack of Transportation (Non-Medical): No  Physical Activity: Not on file  Stress: Not on file  Social Connections: Not on file  Intimate Partner Violence: Not At Risk (01/18/2023)   Humiliation, Afraid, Rape, and Kick questionnaire    Fear of Current or Ex-Partner: No    Emotionally Abused: No    Physically Abused: No    Sexually Abused: No     Code Status   Code Status: Full Code  Review of Systems: All systems reviewed and negative except where noted in HPI.  Physical Exam: Vital signs in last 24 hours: Temp:  [97.5 F (36.4 C)-98.2 F (36.8 C)] 97.5 F (36.4 C) (05/09 0528) Pulse Rate:  [70-99] 70 (05/09 0528) Resp:  [12-21] 15 (05/09 0528) BP: (110-167)/(52-96) 134/52 (05/09 0528) SpO2:  [87 %-97 %] 96 % (05/09 0528) Weight:  [66.7 kg] 66.7 kg (05/08 1914) Last BM Date : 01/18/23  General:  Pleasant female in NAD Psych:  Cooperative. Normal mood and affect Eyes: Pupils equal Ears:  Normal auditory acuity Nose: No deformity, discharge or lesions Neck:  Supple, no masses felt Lungs:  Clear to auscultation.  Heart:  Regular rate, regular rhythm.  Abdomen:  Soft, nondistended, nontender, active bowel sounds, no masses felt Rectal :  No stool in vault. At tip of my finger I could feel a solid lesion in anterior rectum. Lesion may have been a hard piece of stool but blood was extracted on DRE so concerned could have a polyp / mass Msk: Symmetrical without gross deformities.  Neurologic:  Alert, oriented, grossly normal neurologically Extremities : No edema Skin:  Intact without significant lesions.    Intake/Output from previous day: 05/08 0701 - 05/09 0700 In: 499.1 [I.V.:299.1; IV Piggyback:200] Out: -  Intake/Output this shift:  No intake/output data recorded.     Paula Guenther, NP-C @  01/18/2023, 9:09 AM     

## 2023-01-18 NOTE — Progress Notes (Signed)
Pt admitted from ED per stretcher. Pt is alert and oriented with family member at bedside. Physical assessment done. Hooked to continuous IV infusion with NS at 69ml/hr.

## 2023-01-18 NOTE — Consult Note (Addendum)
Consultation Note   Referring Provider:  [facility] PCP: Christina Corn, MD Primary Gastroenterologist:  Christina Head, MD        Reason for consultation: abdominal pain, scant rectal bleeding  DOA: 01/17/2023        Hospital Day: 2  Brief Narrative:  Patient is a 89 y.o. year old female whose past medical history includes but is not necessarily limited to iron deficiency anemia, small bowel AVMs, colon AVM, adenomatous colon polyps, DM2, hypertension, CVA , hypothyroidism  cholecystectomy and appendectomy  Assessment and Plan   89 yo female with intermittent rectal bleeding and bowel changes. Evaluated in our office 5/6. Had fecal impaction on exam.  Given bowel purge which was successful but now admitted with abdominal pain. Suspect the mushy / liquid stools were overflow related to constipation. The bleeding may be from internal hemorrhoids but anterior rectal lesion felt on DRE today ( could be hard stool but was bleeding).  Additionally appears to have a small posterior midline fissure. No masses on CT scan is reassuring -She is reluctant to undergo lower endoscopy and given age / co-morbidities this is understandable. Will consider a barium enema or may unsedated anoscopy  -Diltiazem ointment for anal fissure is not on formulary. For now will use topical lidocaine for discomfort.  -Daily miralax  Hypokalemia Repletion in progress   Attending Physician Note   I have taken a history, reviewed the chart and examined the patient. I performed a substantive portion of this encounter, including complete performance of at least one of the key components, in conjunction with the APP. I agree with the APP's note, impression and recommendations with my edits. My additional impressions and recommendations are as follows.   Intermittent hematochezia and new constipation. Recent fecal impaction, resolving. New abdominal pain has improved, CT AP  unremarkable, etiology unclear. Firm posterior and lateral left rectal mass on DRE. Continue Miralax daily.  She does not want to under a colonoscopy with a full bowel prep.  She agrees to a flexible sigmoidoscopy with enema prep.   Christina Head, MD [name] See Loretha Stapler, [facility], for our on call provider     History of Present Illness [name] was seen in our office 01/15/23 for rectal bleeding and an anemia. She had some mild rectal discomfort at times, especially when sitting for prolonged periods of time. She described her stools as mushy to liquid in consistency. Prior to few months ago her stools were soft formed. She was uncertain how low the bleeding had been going on but possibly 2-3 months. She wasn't having any abdominal pain, nausea/ vomiting or other GI symptoms. Rectal exam was attempted but she hard a lot of hard stool in vault precluding DRE or anoscopy. Given 1/2 of a bowel purge, treated empirically for hemorrhoids and CBD checked. Hgb was 12.2 ( baseline 10.2).  Son contacted our on call MD last night. He was inquiring about a new bulge above navel and spoke  The bulge was reducible, not really painful.  We recommended patient go ahead and take the remaining 1/2 of bowel prep and our office would arrange for a non-urgent CT scan.   Son called answering service back last night. Patient had developed abdominal pain  and distention. She had had multiple BMs following bowel purge. We advised ED evaluation.    ED evaluation:  Afebrile. K+ was low at 2.5, Mg+ low. WBC normal , Hgb 9.5. Renal function, liver chemistries and lipase normal. U/A unremarkable. CT scan with contrast without any acute findings.  She was having transient episodes of hypoxia.   [name] began having RLQ discomfort yesterday. She was not having abdominal discomfort when I saw her in clinic a few days ago. Pain is intermittent. Continues to have the discomfort today. Had a small BM earlier this am. The  periumbilical knot described by son over the phone is apparently visible only when she stands   Imaging and Labs Recent Labs    01/15/23 1230 01/17/23 1940 01/18/23 0532  WBC 8.0 5.5 4.9  HGB 12.2 11.4* 9.5*  HCT 36.4 34.6* 29.8*  PLT 299.0 267 236   Recent Labs    01/17/23 1940 01/18/23 0532  NA 134* 136  K 2.5* 2.9*  CL 96* 103  CO2 28 28  GLUCOSE 203* 123*  BUN 12 9  CREATININE 0.69 0.62  CALCIUM 8.7* 8.0*   Recent Labs    01/18/23 0532  PROT 5.6*  ALBUMIN 3.0*  AST 14*  ALT 16  ALKPHOS 50  BILITOT 0.8    CT with contrast 01/17/23  CLINICAL DATA:  Abdominal pain and distention. Side GI on Monday and was impacted and given laxatives. Has had multiple bowel movements since.   EXAM: CT ABDOMEN AND PELVIS WITH CONTRAST   TECHNIQUE: Multidetector CT imaging of the abdomen and pelvis was performed using the standard protocol following bolus administration of intravenous contrast.   RADIATION DOSE REDUCTION: This exam was performed according to the departmental dose-optimization program which includes automated exposure control, adjustment of the mA and/or kV according to patient size and/or use of iterative reconstruction technique.   CONTRAST:  80mL OMNIPAQUE IOHEXOL 300 MG/ML  SOLN   COMPARISON:  Abdominal ultrasound 07/12/2017 and report from CT abdomen and pelvis 01/20/2019   FINDINGS: Lower chest: No acute abnormality.   Hepatobiliary: Cholecystectomy. No focal hepatic lesion. Post cholecystectomy prominence of the common bile duct.   Pancreas: Unremarkable.   Spleen: Unremarkable.   Adrenals/Urinary Tract: Normal adrenal glands. No urinary calculi or hydronephrosis. Unremarkable bladder.   Stomach/Bowel: Normal caliber large and small bowel. No bowel wall thickening. The appendix is not visualized. Stomach is within normal limits. Duodenal diverticulum.   Vascular/Lymphatic: Aortic atherosclerotic calcification. Calcification at the  origins of the celiac axis and SMA cause moderate narrowing. 1.9 cm left common iliac artery ectasia. No lymphadenopathy.   Reproductive: Uterus and bilateral adnexa are unremarkable.   Other: No free intraperitoneal fluid or air.   Musculoskeletal: No acute fracture.   IMPRESSION: 1. No acute findings in the abdomen or pelvis.   Aortic Atherosclerosis (ICD10-I70.0).     Electronically Signed   By: Minerva Fester M.D.   On: 01/17/2023 21:06     Previous GI Evaluation:   March 2019 colonoscopy for rectal bleeding -- One 5 mm polyp in the transverse colon, removed with a cold snare. Resected and retrieved. - One 15 mm polyp in the very distal rectum, removed with a hot snare. Resected and retrieved. - One non-bleeding colonic angiodysplastic lesion. Treated with argon plasma coagulation (APC) for destruction, the lesion bled slightly during treatment. - The examination was otherwise normal on direct and retroflexion views. - The cecal AVM and/or the medium sized rectal polyp may have contributed  to her anemia. The rectal polyp almost certainly explains her minor rectal bleeding     1. Colon, polyp(s), Transverse - TUBULAR ADENOMA. - NO HIGH GRADE DYSPLASIA OR MALIGNANCY. 2. Rectum, polyp(s) - TUBULAR ADENOMA. - NO HIGH GRADE DYSPLASIA OR MALIGNANCY.   March 2019 EGD - One small duodenal bulb AVM was found and destroyed with APC applicaiton. - Small hiatal hernia without associated erosions. - The duodenal AVM may have contributed to her anemia.   Principal Problem:   Hypokalemia Active Problems:   Symptomatic anemia   Hyperlipidemia   Anxiety   Stroke (HCC)   Hypothyroidism   Diabetes mellitus without complication (HCC)   GIB (gastrointestinal bleeding)   Acute respiratory failure with hypoxia (HCC)   Hypomagnesemia   Periumbilical hernia   Past Medical History:  Diagnosis Date   Anemia    Anxiety    Colon polyps    DDD (degenerative disc disease), cervical     Diabetes mellitus    Diabetes mellitus, type 2 (HCC)    Hyperlipidemia    Hypertension    Hypothyroidism    Leukemia (HCC) in remission   Mucous retention cyst of maxillary sinus    Osteoporosis    Pinched nerve In neck   Plantar fasciitis    Stroke [facility])    Vitamin D deficiency     Past Surgical History:  Procedure Laterality Date   APPENDECTOMY     CHOLECYSTECTOMY     2004   COLONOSCOPY N/A 02/24/2013   Procedure: COLONOSCOPY;  Surgeon: Meryl Dare, MD;  Location: [facility] ENDOSCOPY;  Service: Endoscopy;  Laterality: N/A;   COLONOSCOPY W/ POLYPECTOMY     COLONOSCOPY WITH PROPOFOL N/A 12/03/2017   Procedure: COLONOSCOPY WITH PROPOFOL;  Surgeon: Rachael Fee, MD;  Location: [facility] ENDOSCOPY;  Service: Gastroenterology;  Laterality: N/A;   ESOPHAGOGASTRODUODENOSCOPY     ESOPHAGOGASTRODUODENOSCOPY N/A 02/24/2013   Procedure: ESOPHAGOGASTRODUODENOSCOPY (EGD);  Surgeon: Meryl Dare, MD;  Location: [facility] ENDOSCOPY;  Service: Endoscopy;  Laterality: N/A;   ESOPHAGOGASTRODUODENOSCOPY (EGD) WITH PROPOFOL N/A 12/03/2017   Procedure: ESOPHAGOGASTRODUODENOSCOPY (EGD) WITH PROPOFOL;  Surgeon: Rachael Fee, MD;  Location: [facility] ENDOSCOPY;  Service: Gastroenterology;  Laterality: N/A;   EYE SURGERY     bilateral catracts    Family History  Problem Relation Age of Onset   Diabetes Other    Colon cancer Son    Esophageal cancer Neg Hx    Stomach cancer Neg Hx     Prior to Admission medications   Medication Sig Start Date End Date Taking? Authorizing Provider  ALPRAZolam Prudy Feeler) 0.5 MG tablet Take 0.5 mg by mouth 3 (three) times daily as needed for anxiety. For anxiety   Yes [provider]  amLODipine (NORVASC) 2.5 MG tablet Take 10 mg by mouth daily.    Yes [provider]  atorvastatin (LIPITOR) 20 MG tablet Take 20 mg by mouth daily.   Yes [provider]  levothyroxine (SYNTHROID) 125 MCG tablet Take 125 mcg by mouth daily before breakfast.   Yes [provider]  olmesartan-hydrochlorothiazide (BENICAR HCT) 40-25 MG tablet Take 1 tablet by mouth daily. 10/15/17  Yes [provider]  pantoprazole (PROTONIX) 40 MG tablet Take 40 mg by mouth daily.   Yes [provider]  potassium chloride SA (K-DUR,KLOR-CON) 20 MEQ tablet Take 20 mEq by mouth daily.   Yes [provider]  albuterol (PROVENTIL HFA;VENTOLIN HFA) 108 (90 Base) MCG/ACT inhaler Inhale 1-2 puffs into the lungs every 6 (six) hours  as needed for wheezing or shortness of breath. Patient not taking: Reported on 01/17/2023 12/05/17   Azalia Bilis, MD  cholecalciferol (VITAMIN D3) 25 MCG (1000 UNIT) tablet Take 1,000 Units by mouth daily.    [provider]  Diclofenac Sodium CR 100 MG 24 hr tablet Take 1 tablet (100 mg total) by mouth daily. Patient not taking: Reported on 01/15/2023 06/29/20   Palumbo, April, MD  ferrous sulfate 325 (65 FE) MG EC tablet Take 1 tablet (325 mg total) by mouth daily with breakfast. Patient not taking: Reported on 01/15/2023 12/04/17   Burnadette Pop, MD  lidocaine (LIDODERM) 5 % Place 1 patch onto the skin daily. Remove & Discard patch within 12 hours or as directed by MD Patient not taking: Reported on 01/15/2023 06/29/20   Palumbo, April, MD  pantoprazole (PROTONIX) 40 MG tablet Take 1 tablet (40 mg total) by mouth daily. 12/04/17 01/15/23  Burnadette Pop, MD    Current Facility-Administered Medications  Medication Dose Route Frequency Provider Last Rate Last Admin   0.9 %  sodium chloride infusion   Intravenous Continuous Therisa Doyne, MD 75 mL/hr at 01/18/23 0115 New Bag at 01/18/23 0115   acetaminophen (TYLENOL) tablet 650 mg  650 mg Oral Q6H PRN Therisa Doyne, MD       Or   acetaminophen (TYLENOL) suppository 650 mg  650 mg Rectal Q6H PRN Doutova, Anastassia, MD       albuterol (PROVENTIL) (2.5 MG/3ML) 0.083% nebulizer solution 2.5 mg  2.5 mg Nebulization Q2H PRN Doutova, Anastassia, MD       ALPRAZolam  Prudy Feeler) tablet 0.5 mg  0.5 mg Oral TID PRN Therisa Doyne, MD       atorvastatin (LIPITOR) tablet 20 mg  20 mg Oral Daily Doutova, Anastassia, MD       ferric gluconate (FERRLECIT) 250 mg in sodium chloride 0.9 % 250 mL IVPB  250 mg Intravenous Daily Adhikari, Amrit, MD       HYDROcodone-acetaminophen (NORCO/VICODIN) 5-325 MG per tablet 1-2 tablet  1-2 tablet Oral Q4H PRN Therisa Doyne, MD   1 tablet at 01/18/23 0130   insulin aspart (novoLOG) injection 0-9 Units  0-9 Units Subcutaneous Q4H Doutova, Anastassia, MD       levothyroxine (SYNTHROID) tablet 125 mcg  125 mcg Oral Q0600 Therisa Doyne, MD   125 mcg at 01/18/23 0534   ondansetron (ZOFRAN) tablet 4 mg  4 mg Oral Q6H PRN Therisa Doyne, MD       Or   ondansetron (ZOFRAN) injection 4 mg  4 mg Intravenous Q6H PRN Doutova, Anastassia, MD       pantoprazole (PROTONIX) EC tablet 40 mg  40 mg Oral Daily Doutova, Anastassia, MD       simethicone (MYLICON) chewable tablet 80 mg  80 mg Oral Q6H PRN Anthoney Harada, NP   80 mg at 01/18/23 0215    Allergies as of 01/17/2023 - Review Complete 01/17/2023  Allergen Reaction Noted   Prednisone  09/27/2011    Social History   Socioeconomic History   Marital status: Widowed    Spouse name: Not on file   Number of children: 3   Years of education: Not on file   Highest education level: Not on file  Occupational History   Occupation: retired  Tobacco Use   Smoking status: Former   Smokeless tobacco: Never  Building services engineer Use: Former  Substance and Sexual Activity   Alcohol use: No   Drug use: No   Sexual  activity: Not Currently  Other Topics Concern   Not on file  Social History Narrative   Not on file   Social Determinants of Health   Financial Resource Strain: Not on file  Food Insecurity: No Food Insecurity (01/18/2023)   Hunger Vital Sign    Worried About Running Out of Food in the Last Year: Never true    Ran Out of Food in the Last Year: Never true   Transportation Needs: No Transportation Needs (01/18/2023)   PRAPARE - Administrator, Civil Service (Medical): No    Lack of Transportation (Non-Medical): No  Physical Activity: Not on file  Stress: Not on file  Social Connections: Not on file  Intimate Partner Violence: Not At Risk (01/18/2023)   Humiliation, Afraid, Rape, and Kick questionnaire    Fear of Current or Ex-Partner: No    Emotionally Abused: No    Physically Abused: No    Sexually Abused: No     Code Status   Code Status: Full Code  Review of Systems: All systems reviewed and negative except where noted in HPI.  Physical Exam: Vital signs in last 24 hours: Temp:  [97.5 F (36.4 C)-98.2 F (36.8 C)] 97.5 F (36.4 C) (05/09 0528) Pulse Rate:  [70-99] 70 (05/09 0528) Resp:  [12-21] 15 (05/09 0528) BP: (110-167)/(52-96) 134/52 (05/09 0528) SpO2:  [87 %-97 %] 96 % (05/09 0528) Weight:  [66.7 kg] 66.7 kg (05/08 1914) Last BM Date : 01/18/23  General:  Pleasant female in NAD Psych:  Cooperative. Normal mood and affect Eyes: Pupils equal Ears:  Normal auditory acuity Nose: No deformity, discharge or lesions Neck:  Supple, no masses felt Lungs:  Clear to auscultation.  Heart:  Regular rate, regular rhythm.  Abdomen:  Soft, nondistended, nontender, active bowel sounds, no masses felt Rectal :  No stool in vault. At tip of my finger I could feel a solid lesion in anterior rectum. Lesion may have been a hard piece of stool but blood was extracted on DRE so concerned could have a polyp / mass Msk: Symmetrical without gross deformities.  Neurologic:  Alert, oriented, grossly normal neurologically Extremities : No edema Skin:  Intact without significant lesions.    Intake/Output from previous day: 05/08 0701 - 05/09 0700 In: 499.1 [I.V.:299.1; IV Piggyback:200] Out: -  Intake/Output this shift:  No intake/output data recorded.     Willette Cluster, NP-C @  01/18/2023, 9:09 AM

## 2023-01-19 ENCOUNTER — Encounter: Payer: Self-pay | Admitting: *Deleted

## 2023-01-19 ENCOUNTER — Inpatient Hospital Stay (HOSPITAL_COMMUNITY): Payer: Medicare PPO | Admitting: Certified Registered"

## 2023-01-19 ENCOUNTER — Encounter (HOSPITAL_COMMUNITY): Payer: Self-pay | Admitting: Internal Medicine

## 2023-01-19 ENCOUNTER — Encounter (HOSPITAL_COMMUNITY)
Admission: EM | Disposition: A | Discharge status: Home Health | Payer: Self-pay | Source: Home / Self Care | Attending: Internal Medicine

## 2023-01-19 DIAGNOSIS — K5730 Diverticulosis of large intestine without perforation or abscess without bleeding: Secondary | ICD-10-CM | POA: Diagnosis not present

## 2023-01-19 DIAGNOSIS — K6289 Other specified diseases of anus and rectum: Secondary | ICD-10-CM | POA: Diagnosis not present

## 2023-01-19 DIAGNOSIS — K625 Hemorrhage of anus and rectum: Secondary | ICD-10-CM | POA: Diagnosis not present

## 2023-01-19 DIAGNOSIS — I10 Essential (primary) hypertension: Secondary | ICD-10-CM

## 2023-01-19 DIAGNOSIS — K5669 Other partial intestinal obstruction: Secondary | ICD-10-CM | POA: Diagnosis not present

## 2023-01-19 DIAGNOSIS — C20 Malignant neoplasm of rectum: Principal | ICD-10-CM

## 2023-01-19 DIAGNOSIS — E876 Hypokalemia: Secondary | ICD-10-CM | POA: Diagnosis not present

## 2023-01-19 DIAGNOSIS — R6889 Other general symptoms and signs: Secondary | ICD-10-CM | POA: Insufficient documentation

## 2023-01-19 DIAGNOSIS — E440 Moderate protein-calorie malnutrition: Secondary | ICD-10-CM | POA: Diagnosis present

## 2023-01-19 DIAGNOSIS — Z87891 Personal history of nicotine dependence: Secondary | ICD-10-CM

## 2023-01-19 HISTORY — PX: FLEXIBLE SIGMOIDOSCOPY: SHX5431

## 2023-01-19 HISTORY — PX: BIOPSY: SHX5522

## 2023-01-19 LAB — CBC
HCT: 33 % — ABNORMAL LOW (ref 36.0–46.0)
Hemoglobin: 10.4 g/dL — ABNORMAL LOW (ref 12.0–15.0)
MCH: 26.6 pg (ref 26.0–34.0)
MCHC: 31.5 g/dL (ref 30.0–36.0)
MCV: 84.4 fL (ref 80.0–100.0)
Platelets: 251 10*3/uL (ref 150–400)
RBC: 3.91 MIL/uL (ref 3.87–5.11)
RDW: 14.2 % (ref 11.5–15.5)
WBC: 6.8 10*3/uL (ref 4.0–10.5)
nRBC: 0 % (ref 0.0–0.2)

## 2023-01-19 LAB — BASIC METABOLIC PANEL
Anion gap: 6 (ref 5–15)
BUN: 5 mg/dL — ABNORMAL LOW (ref 8–23)
CO2: 29 mmol/L (ref 22–32)
Calcium: 8.2 mg/dL — ABNORMAL LOW (ref 8.9–10.3)
Chloride: 104 mmol/L (ref 98–111)
Creatinine, Ser: 0.63 mg/dL (ref 0.44–1.00)
GFR, Estimated: >60 mL/min (ref >60)
Glucose, Bld: 109 mg/dL — ABNORMAL HIGH (ref 70–99)
Potassium: 3.3 mmol/L — ABNORMAL LOW (ref 3.5–5.1)
Sodium: 139 mmol/L (ref 135–145)

## 2023-01-19 LAB — GLUCOSE, CAPILLARY
Glucose-Capillary: 112 mg/dL — ABNORMAL HIGH (ref 70–99)
Glucose-Capillary: 100 mg/dL — ABNORMAL HIGH (ref 70–99)
Glucose-Capillary: 111 mg/dL — ABNORMAL HIGH (ref 70–99)
Glucose-Capillary: 113 mg/dL — ABNORMAL HIGH (ref 70–99)
Glucose-Capillary: 106 mg/dL — ABNORMAL HIGH (ref 70–99)

## 2023-01-19 SURGERY — SIGMOIDOSCOPY, FLEXIBLE
Anesthesia: Monitor Anesthesia Care

## 2023-01-19 MED ORDER — PROPOFOL 10 MG/ML IV BOLUS
INTRAVENOUS | Status: DC | PRN
  Administered 2023-01-19: 20 mg via INTRAVENOUS

## 2023-01-19 MED ORDER — LIDOCAINE 2% (20 MG/ML) 5 ML SYRINGE
INTRAMUSCULAR | Status: DC | PRN
  Administered 2023-01-19: 40 mg via INTRAVENOUS

## 2023-01-19 MED ORDER — AMISULPRIDE (ANTIEMETIC) 5 MG/2ML IV SOLN: 10.0000 mg | Freq: Once | INTRAVENOUS | Status: DC | PRN

## 2023-01-19 MED ORDER — PROPOFOL 500 MG/50ML IV EMUL
INTRAVENOUS | Status: AC
  Filled 2023-01-19: qty 50

## 2023-01-19 MED ORDER — ONDANSETRON HCL 4 MG/2ML IJ SOLN
4.0000 mg | Freq: Once | INTRAMUSCULAR | Status: DC | PRN
  Filled 2023-01-19: qty 2

## 2023-01-19 MED ORDER — LACTATED RINGERS IV SOLN
INTRAVENOUS | Status: DC
  Administered 2023-01-19: 1000 mL via INTRAVENOUS

## 2023-01-19 MED ORDER — PROPOFOL 500 MG/50ML IV EMUL
INTRAVENOUS | Status: DC | PRN
  Administered 2023-01-19: 125 ug/kg/min via INTRAVENOUS

## 2023-01-19 NOTE — Progress Notes (Addendum)
Progress Note   Patient: [name] ZOX:[id number] DOB: 1929/03/15 DOA: 01/17/2023     1 DOS: the patient was seen and examined on 01/19/2023   Brief hospital course:  89 year old female with history of intermittent GI bleed secondary to AVMs, GERD, hypothyroidism who presented to the emergency department for the complaint of worsening abdominal pain, distention, nausea, vomiting, bright red blood per rectum.  Patient's family called gastroenterology office and was suggested to go to the emergency department.  Also reported to have frequent bowel movement after she was recently started on MiraLAX.  Has history of small bowel/colon AVMs.  Noted to have blood on the toilet paper.  On presentation, she was hemodynamically stable.  Lab work showed severe hypokalemia with potassium of 2.5, magnesium of 1.6, hemoglobin of 11.4.  Anemia panel showed low iron of 25.  GI consulted - thinks symptoms are related to encoparesis/overflow from constipation.  There is a rectal mass noted, planning for flex sig with enema prep.  Assessment and Plan: Rectal mass -Patient presented with abdominal pain, n/v -There was concern for constipation that did not improve with Miralax -She has been effectively cleaned out -While undergoing evaluation, she was noted to have a firm peri-rectal mass and flex sig was scheduled for today -Flex sig showed: Rectal mass 2. 0 cm from the anal verge. 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 Biopsied. - Diverticulosis in the sigmoid colon. -She has completed a CT A/P and is now scheduled for CT chest as well -Her son is undergoing treatment at this time for stage 4 colon CA  Iron deficiency anemia -History of AVMs,hemorrhoids.  -Report of blood red blood per rectum, seen on toilet paper.  -Hgb stable -Anemia panel showed low iron, given IV iron.     Hypokalemia/hypomagnesemia -Currently being monitored and supplemented   Respiratory  insufficiency -Not on oxygen at home.   -Currently requiring 2 L of oxygen.   -Chest x-ray  did not show any acute findings,showed chronic  interstitial coarsening in the lower lungs  -She has a 20+ pack year smoking history -Wean as tolerated -Encourage ambulation -IS   Sinus pauses -Considered transferring to med surg today but RN reports sinus pauses up to 9 seconds -Will leave on telemetry for now  Hyperlipidemia -Continue Lipitor   Hypothyroidism -Continue Synthroid  Anxiety -Continue alprazolam  HTN -Hold olmesartan, HCTZ for now  Malnutrition Nutrition Problem: Moderate Malnutrition Etiology: acute illness (abdominal pain) Signs/Symptoms: mild fat depletion, mild muscle depletion Interventions: Boost Breeze  Goals of care -Reports she is independent, still cleans house and performs ADLs -Full code      Subjective: She is feeling well today, hunger.  She agrees with plan for flex sig today, has been effectively cleaned out.  She is receiving iron infusion now, reports remote h/o being on iron but none recently.  She is currently on 2L [place] O2, not normally on home O2.  She is wheezing.  Reports 20-30 pack years of smoking remotely, no known h/o COPD.  Has not been up and about much, no IS at bedside.  Physical Exam: Vitals:   01/19/23 1340 01/19/23 1345 01/19/23 1350 01/19/23 1515  BP:    (!) 156/68  Pulse:    91  Resp:    17  Temp:    (!) 100.6 F (38.1 C)  TempSrc:    Oral  SpO2: 95% 95% 96% 95%  Weight:      Height:  General:  Appears calm and comfortable and is in NAD, on 2L [place] O2 Eyes:   EOMI, normal lids, iris ENT:  grossly normal hearing, lips & tongue, moderately dry mm with white coating on tongue likely associated with no recent PO intake rather than thrush Neck:  no LAD, masses or thyromegaly Cardiovascular:  RRR, no m/r/g. No LE edema.  Respiratory:   CTA bilaterally with no wheezes/rales/rhonchi.  Normal respiratory effort. Abdomen:   soft, NT, ND Skin:  no rash or induration seen on limited exam Musculoskeletal:  grossly normal tone BUE/BLE, good ROM, no bony abnormality Psychiatric:  grossly normal mood and affect, speech fluent and appropriate, AOx3 Neurologic:  CN 2-12 grossly intact, moves all extremities in coordinated fashion   Radiological Exams on Admission: Independently reviewed - see discussion in A/P where applicable  DG Chest Port 1 View  Result Date: 01/17/2023 CLINICAL DATA:  Chest pain EXAM: PORTABLE CHEST 1 VIEW COMPARISON:  12/05/2017 FINDINGS: Stable cardiomediastinal silhouette. Aortic atherosclerotic calcification. Diffuse interstitial coarsening greatest in the lower lungs. No focal consolidation, pleural effusion, pneumothorax. No definite displaced rib fractures. IMPRESSION: No active disease. Interstitial coarsening in the lower lungs is similar to 12/05/2017. Electronically Signed   By: Minerva Fester M.D.   On: 01/17/2023 21:24   CT ABDOMEN PELVIS W CONTRAST  Result Date: 01/17/2023 CLINICAL DATA:  Abdominal pain and distention. Side GI on Monday and was impacted and given laxatives. Has had multiple bowel movements since. EXAM: CT ABDOMEN AND PELVIS WITH CONTRAST TECHNIQUE: Multidetector CT imaging of the abdomen and pelvis was performed using the standard protocol following bolus administration of intravenous contrast. RADIATION DOSE REDUCTION: This exam was performed according to the departmental dose-optimization program which includes automated exposure control, adjustment of the mA and/or kV according to patient size and/or use of iterative reconstruction technique. CONTRAST:  80mL OMNIPAQUE IOHEXOL 300 MG/ML  SOLN COMPARISON:  Abdominal ultrasound 07/12/2017 and report from CT abdomen and pelvis 01/20/2019 FINDINGS: Lower chest: No acute abnormality. Hepatobiliary: Cholecystectomy. No focal hepatic lesion. Post cholecystectomy prominence of the common bile duct. Pancreas: Unremarkable. Spleen:  Unremarkable. Adrenals/Urinary Tract: Normal adrenal glands. No urinary calculi or hydronephrosis. Unremarkable bladder. Stomach/Bowel: Normal caliber large and small bowel. No bowel wall thickening. The appendix is not visualized. Stomach is within normal limits. Duodenal diverticulum. Vascular/Lymphatic: Aortic atherosclerotic calcification. Calcification at the origins of the celiac axis and SMA cause moderate narrowing. 1.9 cm left common iliac artery ectasia. No lymphadenopathy. Reproductive: Uterus and bilateral adnexa are unremarkable. Other: No free intraperitoneal fluid or air. Musculoskeletal: No acute fracture. IMPRESSION: 1. No acute findings in the abdomen or pelvis. Aortic Atherosclerosis (ICD10-I70.0). Electronically Signed   By: Minerva Fester M.D.   On: 01/17/2023 21:06    EKG: last on 5/8   Labs on Admission: I have personally reviewed the available labs and imaging studies at the time of the admission.  Pertinent labs:    K+ 3.3 Glucose 109 WBC 6.8 Hgb 10.4 - stable  Family Communication: Daughter-in-law was present throughout evaluation  Disposition: Status is: Inpatient Remains inpatient appropriate because: rectal mass  Planned Discharge Destination: Home    Time spent: 50 minutes  Author: Jonah Blue, MD 01/19/2023 4:49 PM  For on call review [email or web address].

## 2023-01-19 NOTE — Progress Notes (Signed)
Son, [name] called office to alert Dr. Truett Perna that his mother has been diagnosed with colorectal cancer. He wants Dr. Truett Perna to be her oncologist since he also takes care of him. Informed him that when referral is sent Dr. Truett Perna will be glad to see her after she leaves the hospital. He said he will make his wishes known to hospital provider.

## 2023-01-19 NOTE — Interval H&P Note (Signed)
History and Physical Interval Note: For flexible sigmoidoscopy today to evaluate rectal bleeding, anemia and abnormal digital rectal exam I explained to her that I would be performing the procedure which she discussed yesterday with Dr. Russella Dar.  We discussed the risk, benefits and alternatives and she is agreeable and wishes to proceed.     Latest Ref Rng & Units 01/19/2023    5:40 AM 01/18/2023    5:32 AM 01/17/2023    7:40 PM  CBC  WBC 4.0 - 10.5 K/uL 6.8  4.9  5.5   Hemoglobin 12.0 - 15.0 g/dL 57.8  9.5  46.9   Hematocrit 36.0 - 46.0 % 33.0  29.8  34.6   Platelets 150 - 400 K/uL 251  236  267     01/19/2023 1:03 PM  Lemoyne R Sasaki  has presented today for surgery, with the diagnosis of rectal bleeding, rectal mass.  The various methods of treatment have been discussed with the patient and family. After consideration of risks, benefits and other options for treatment, the patient has consented to  Procedure(s): FLEXIBLE SIGMOIDOSCOPY (N/A) as a surgical intervention.  The patient's history has been reviewed, patient examined, no change in status, stable for surgery.  I have reviewed the patient's chart and labs.  Questions were answered to the patient's satisfaction.     Carie Caddy Sophina Mitten

## 2023-01-19 NOTE — Hospital Course (Signed)
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  Patient's family called gastroenterology office and was suggested to go to the emergency department.  Also reported to have frequent bowel movement after she was recently started on MiraLAX.  Has history of small bowel/colon AVMs.  Noted to have blood on the toilet paper.  On presentation, she was hemodynamically stable.  Lab work showed severe hypokalemia with potassium of 2.5, magnesium of 1.6, hemoglobin of 11.4.  Anemia panel showed low iron of 25.  GI consulted - thinks symptoms are related to encoparesis/overflow from constipation.  There is a rectal mass noted, underwent flex sig with enema prep.  Unfortunately, this showed a rectal mass 2 cm from the anal verge and malignant partially obstructing tumor in the mid rectum and in the distal rectum that was biopsied (path pending).  Her son is undergoing treatment for stage 4 rectal cancer.

## 2023-01-19 NOTE — Progress Notes (Signed)
OT Cancellation Note  Patient Details Name: [name] MRN: [id number] DOB: 1929/05/11   Cancelled Treatment:    Reason Eval/Treat Not Completed: Patient declined, stating she wasn't feeling up to it. Per pt's daughter-in-law, the pt has been feeling down, as she recently received report of an unfortunate diagnosis. Will attempt another day.     Reuben Likes, OTR/L 01/19/2023, 4:20 PM

## 2023-01-19 NOTE — Progress Notes (Signed)
OT Cancellation Note  Patient Details Name: [name] MRN: [id number] DOB: 01/08/1929   Cancelled Treatment:    Reason Eval/Treat Not Completed: Patient at procedure or test/ unavailable-Off floor for FLEXIBLE SIGMOIDOSCOPY per chart.    Theodoro Clock 01/19/2023, 2:45 PM

## 2023-01-19 NOTE — Anesthesia Procedure Notes (Signed)
Procedure Name: MAC Date/Time: 01/19/2023 1:13 PM  Performed by: Sindy Guadeloupe, CRNAPre-anesthesia Checklist: Patient identified, Emergency Drugs available, Suction available, Patient being monitored and Timeout performed Oxygen Delivery Method: Simple face mask Placement Confirmation: positive ETCO2

## 2023-01-19 NOTE — Anesthesia Postprocedure Evaluation (Signed)
Anesthesia Post Note  Patient: Tiaunna R Puchalski  Procedure(s) Performed: FLEXIBLE SIGMOIDOSCOPY BIOPSY     Patient location during evaluation: PACU Anesthesia Type: MAC Level of consciousness: awake and alert Pain management: pain level controlled Vital Signs Assessment: post-procedure vital signs reviewed and stable Respiratory status: spontaneous breathing, nonlabored ventilation, respiratory function stable and patient connected to nasal cannula oxygen Cardiovascular status: stable and blood pressure returned to baseline Postop Assessment: no apparent nausea or vomiting Anesthetic complications: no  No notable events documented.  Last Vitals:  Vitals:   01/19/23 1345 01/19/23 1350  BP:    Pulse:    Resp:    Temp:    SpO2: 95% 96%    Last Pain:  Vitals:   01/19/23 1335  TempSrc:   PainSc: 0-No pain                 Shelton Silvas

## 2023-01-19 NOTE — Progress Notes (Signed)
PT Cancellation Note  Patient Details Name: [name] MRN: [id number] DOB: [birth year]   Cancelled Treatment:     Procedure(s) Performed: FLEXIBLE SIGMOIDOSCOPY BIOPSY this afternoon .  Pt has been evaluated.  Will attempt to see another day as schedule permits.  Felecia Shelling  PTA Acute  Rehabilitation Services Office M-F          [phone]

## 2023-01-19 NOTE — Transfer of Care (Signed)
Immediate Anesthesia Transfer of Care Note  Patient: [name]  Procedure(s) Performed: FLEXIBLE SIGMOIDOSCOPY BIOPSY  Patient Location: PACU and Endoscopy Unit  Anesthesia Type:MAC  Level of Consciousness: awake, alert , and patient cooperative  Airway & Oxygen Therapy: Patient Spontanous Breathing and Patient connected to face mask oxygen  Post-op Assessment: Report given to RN and Post -op Vital signs reviewed and stable  Post vital signs: Reviewed and stable  Last Vitals:  Vitals Value Taken Time  BP 111/34 01/19/23 1333  Temp    Pulse 77 01/19/23 1334  Resp 15 01/19/23 1334  SpO2 100 % 01/19/23 1334  Vitals shown include unvalidated device data.  Last Pain:  Vitals:   01/19/23 1253  TempSrc: Oral  PainSc: 0-No pain         Complications: No notable events documented.

## 2023-01-19 NOTE — Anesthesia Preprocedure Evaluation (Addendum)
Anesthesia Evaluation  Patient identified by MRN, date of birth, ID band Patient awake    Reviewed: Allergy & Precautions, NPO status , Patient's Chart, lab work & pertinent test results  Airway Mallampati: I  TM Distance: >3 FB Neck ROM: Full    Dental  (+) Edentulous Upper, Edentulous Lower   Pulmonary former smoker    + decreased breath sounds      Cardiovascular hypertension, Pt. on medications  Rhythm:Regular Rate:Tachycardia     Neuro/Psych   Anxiety     CVA, No Residual Symptoms    GI/Hepatic negative GI ROS, Neg liver ROS,,,  Endo/Other  diabetesHypothyroidism    Renal/GU negative Renal ROS     Musculoskeletal  (+) Arthritis ,    Abdominal   Peds  Hematology negative hematology ROS (+)   Anesthesia Other Findings   Reproductive/Obstetrics                             Anesthesia Physical Anesthesia Plan  ASA: 3  Anesthesia Plan: MAC   Post-op Pain Management: Minimal or no pain anticipated   Induction: Intravenous  PONV Risk Score and Plan: 0 and Propofol infusion  Airway Management Planned: Simple Face Mask and Natural Airway  Additional Equipment: None  Intra-op Plan:   Post-operative Plan:   Informed Consent: I have reviewed the patients History and Physical, chart, labs and discussed the procedure including the risks, benefits and alternatives for the proposed anesthesia with the patient or authorized representative who has indicated his/her understanding and acceptance.       Plan Discussed with: CRNA  Anesthesia Plan Comments:        Anesthesia Quick Evaluation

## 2023-01-19 NOTE — Op Note (Signed)
[facility] Patient Name: Dayton Bowdoin Procedure Date: 01/19/2023 MRN: [id number] Attending MD: Beverley Fiedler , MD, [id number] Date of Birth: 1929-06-17 CSN: [id number] Age: 89 Admit Type: Inpatient Procedure:                Flexible Sigmoidoscopy Indications:              Rectal bleeding, constipation and abnormal digital                            rectal examination Providers:                Carie Caddy. Rhea Belton, MD, Pollie Friar RN, RN, Irene Shipper, Technician, Evelina Dun CRNA Referring MD:             [facility] Medicines:                Monitored Anesthesia Care Complications:            No immediate complications. Estimated Blood Loss:     Estimated blood loss was minimal. Procedure:                Pre-Anesthesia Assessment:                           - Prior to the procedure, a History and Physical                            was performed, and patient medications and                            allergies were reviewed. The patient's tolerance of                            previous anesthesia was also reviewed. The risks                            and benefits of the procedure and the sedation                            options and risks were discussed with the patient.                            All questions were answered, and informed consent                            was obtained. Prior Anticoagulants: The patient has                            taken no anticoagulant or antiplatelet agents. ASA                            Grade Assessment: III - A patient with severe  systemic disease. After reviewing the risks and                            benefits, the patient was deemed in satisfactory                            condition to undergo the procedure.                           After obtaining informed consent, the scope was                            passed under direct vision. The PCF-HQ190L                             ([id number]) Olympus colonoscope was introduced                            through the anus and advanced to the sigmoid colon.                            The flexible sigmoidoscopy was accomplished without                            difficulty. The patient tolerated the procedure                            well. The quality of the bowel preparation was good. Scope In: Scope Out: Findings:      The digital rectal exam revealed a firm rectal mass palpated 2.0 cm from       the anal verge.      A fungating partially obstructing large mass was found in the mid rectum       and in the distal rectum. The mass was partially circumferential       (involving two-thirds of the lumen circumference). The mass measured       eight cm in length. Oozing was present. Biopsies were taken with a cold       forceps for histology.      A few small-mouthed diverticula were found in the sigmoid colon.      Retroflexion in the rectum was not performed due to anatomy. Impression:               - Rectal mass 2.0 cm from the anal verge.                           - Malignant partially obstructing tumor in the mid                            rectum and in the distal rectum. Biopsied.                           - Diverticulosis in the sigmoid colon. Moderate Sedation:      N/A Recommendation:           - Return patient to hospital ward for ongoing care.                           -  Await pathology results.                           - CT scan chest, abd, and pelvis.                           - MiraLax to keep stools very soft.                           - Oncology and surgical consultations. Procedure Code(s):        --- Professional ---                           [phone], Sigmoidoscopy, flexible; with biopsy, single                            or multiple Diagnosis Code(s):        --- Professional ---                           K62.89, Other specified diseases of anus and rectum                            C20, Malignant neoplasm of rectum                           K56.690, Other partial intestinal obstruction                           K62.5, Hemorrhage of anus and rectum                           K57.30, Diverticulosis of large intestine without                            perforation or abscess without bleeding CPT copyright 2022 American Medical Association. All rights reserved. The codes documented in this report are preliminary and upon coder review may  be revised to meet current compliance requirements. Beverley Fiedler, MD 01/19/2023 1:37:17 PM This report has been signed electronically. Number of Addenda: 0

## 2023-01-19 NOTE — TOC Initial Note (Signed)
Transition of Care [facility]) - Initial/Assessment Note    Patient Details  Name: [name] MRN: [id number] Date of Birth: Jun 20, 1929  Transition of Care [facility]) CM/SW Contact:    Otelia Santee, LCSW Phone Number: 01/19/2023, 12:27 PM  Clinical Narrative:                 Met with pt to discuss recommendation for [facility]. Pt initially declines home health stating she is independent at home and does not need assistance. Pt's daughter in room discussed challenges with pt. Pt agreed to [facility] being arranged.  Spoke with Amy at [facility] who have accepted this pt for HHPT. HH order will need to be placed prior to discharge.   Expected Discharge Plan: Home w Home Health Services Barriers to Discharge: No Barriers Identified   Patient Goals and CMS Choice Patient states their goals for this hospitalization and ongoing recovery are:: To go home CMS Medicare.gov Compare Post Acute Care list provided to:: Patient Choice offered to / list presented to : Patient [place] ownership interest in [facility].provided to:: Patient    Expected Discharge Plan and Services In-house Referral: Clinical Social Work Discharge Planning Services: NA Post Acute Care Choice: Home Health Living arrangements for the past 2 months: Single Family Home                 DME Arranged: N/A DME Agency: NA       HH Arranged: PT HH Agency: [facility] Date HH Agency Contacted: 01/19/23 Time HH Agency Contacted: 1226 Representative spoke with at [facility] Agency: Amy  Prior Living Arrangements/Services Living arrangements for the past 2 months: Single Family Home Lives with:: Adult Children Patient language and need for interpreter reviewed:: Yes Do you feel safe going back to the place where you live?: Yes      Need for Family Participation in Patient Care: Yes (Comment) Care giver support system in place?: Yes (comment) Current home services: DME (RW;Shower seat; Cane - single point;  BSC/3in1) Criminal Activity/Legal Involvement Pertinent to Current Situation/Hospitalization: No - Comment as needed  Activities of Daily Living Home Assistive Devices/Equipment: None ADL Screening (condition at time of admission) Patient's cognitive ability adequate to safely complete daily activities?: Yes Is the patient deaf or have difficulty hearing?: Yes Does the patient have difficulty seeing, even when wearing glasses/contacts?: Yes Does the patient have difficulty concentrating, remembering, or making decisions?: Yes Patient able to express need for assistance with ADLs?: No Does the patient have difficulty dressing or bathing?: No Independently performs ADLs?: Yes (appropriate for developmental age) Does the patient have difficulty walking or climbing stairs?: No Weakness of Legs: None Weakness of Arms/Hands: None  Permission Sought/Granted Permission sought to share information with : Family Supports Permission granted to share information with : Yes, Verbal Permission Granted              Emotional Assessment Appearance:: Appears stated age Attitude/Demeanor/Rapport: Engaged Affect (typically observed): Stable Orientation: : Oriented to Self, Oriented to Place, Oriented to  Time, Oriented to Situation Alcohol / Substance Use: Not Applicable Psych Involvement: No (comment)  Admission diagnosis:  Hypokalemia [E87.6] Abdominal distention [R14.0] Fatigue, unspecified type [R53.83] Patient Active Problem List   Diagnosis Date Noted   Malnutrition of moderate degree 01/19/2023   Acute respiratory failure with hypoxia (HCC) 01/17/2023   Hypomagnesemia 01/17/2023   Periumbilical hernia 01/17/2023   Polyp of rectum    AVM (arteriovenous malformation) of colon    AVM (arteriovenous malformation)  of duodenum, acquired    Lower GI bleed 12/02/2017   GIB (gastrointestinal bleeding) 12/01/2017   Hyponatremia 12/01/2017   Hypokalemia 12/01/2017   HTN (hypertension)  12/01/2017   Diabetes mellitus without complication (HCC) 07/14/2017   Acute pancreatitis 07/12/2017   Elevated transaminase level 07/12/2017   Chest pain 07/10/2017   Hyperlipidemia    Anxiety    Stroke (HCC)    Hypothyroidism    Iron deficiency anemia, unspecified 02/24/2013   Nonspecific abnormal finding in stool contents 02/24/2013   Personal history of colonic polyps 02/24/2013   Symptomatic anemia    PCP:  Creola Corn, MD Pharmacy:   [facility] [phone] [place], [place] - [address] [place] [address] Phone: [phone] Fax: [phone]  [id number] [place], [place] - [address] AT [address] [place] [address] Phone: [phone] Fax: [phone]     Social Determinants of Health (SDOH) Social History: SDOH Screenings   Food Insecurity: No Food Insecurity (01/18/2023)  Housing: Low Risk  (01/18/2023)  Transportation Needs: No Transportation Needs (01/18/2023)  Utilities: Not At Risk (01/18/2023)  Tobacco Use: Medium Risk (01/17/2023)   SDOH Interventions:     Readmission Risk Interventions    01/19/2023   12:24 PM  Readmission Risk Prevention Plan  Post Dischage Appt Complete  Medication Screening Complete  Transportation Screening Complete

## 2023-01-19 NOTE — Procedures (Addendum)
At the patient's request I spoke to her soon-to-be daughter-in-law Illene Silver by phone after the procedure I explained to her the findings as well as the next diagnostic testing and eventual surgical and oncologic consults. Unfortunately the patient's son is undergoing treatment for stage IV colon cancer currently.  Time provided for questions and answers and she thanked me for the call

## 2023-01-20 ENCOUNTER — Inpatient Hospital Stay (HOSPITAL_COMMUNITY): Payer: Medicare PPO

## 2023-01-20 DIAGNOSIS — I480 Paroxysmal atrial fibrillation: Secondary | ICD-10-CM

## 2023-01-20 DIAGNOSIS — K625 Hemorrhage of anus and rectum: Secondary | ICD-10-CM | POA: Diagnosis not present

## 2023-01-20 DIAGNOSIS — K6289 Other specified diseases of anus and rectum: Secondary | ICD-10-CM | POA: Diagnosis not present

## 2023-01-20 DIAGNOSIS — E876 Hypokalemia: Secondary | ICD-10-CM | POA: Diagnosis not present

## 2023-01-20 DIAGNOSIS — K5900 Constipation, unspecified: Secondary | ICD-10-CM | POA: Diagnosis not present

## 2023-01-20 LAB — GLUCOSE, CAPILLARY
Glucose-Capillary: 100 mg/dL — ABNORMAL HIGH (ref 70–99)
Glucose-Capillary: 105 mg/dL — ABNORMAL HIGH (ref 70–99)
Glucose-Capillary: 115 mg/dL — ABNORMAL HIGH (ref 70–99)
Glucose-Capillary: 153 mg/dL — ABNORMAL HIGH (ref 70–99)
Glucose-Capillary: 160 mg/dL — ABNORMAL HIGH (ref 70–99)
Glucose-Capillary: 166 mg/dL — ABNORMAL HIGH (ref 70–99)

## 2023-01-20 LAB — CBC
HCT: 31.3 % — ABNORMAL LOW (ref 36.0–46.0)
Hemoglobin: 9.6 g/dL — ABNORMAL LOW (ref 12.0–15.0)
MCH: 26.7 pg (ref 26.0–34.0)
MCHC: 30.7 g/dL (ref 30.0–36.0)
MCV: 87.2 fL (ref 80.0–100.0)
Platelets: 219 10*3/uL (ref 150–400)
RBC: 3.59 MIL/uL — ABNORMAL LOW (ref 3.87–5.11)
RDW: 14.3 % (ref 11.5–15.5)
WBC: 5.4 10*3/uL (ref 4.0–10.5)
nRBC: 0 % (ref 0.0–0.2)

## 2023-01-20 LAB — BASIC METABOLIC PANEL
Anion gap: 6 (ref 5–15)
BUN: 8 mg/dL (ref 8–23)
CO2: 28 mmol/L (ref 22–32)
Calcium: 8 mg/dL — ABNORMAL LOW (ref 8.9–10.3)
Chloride: 100 mmol/L (ref 98–111)
Creatinine, Ser: 0.62 mg/dL (ref 0.44–1.00)
GFR, Estimated: >60 mL/min (ref >60)
Glucose, Bld: 109 mg/dL — ABNORMAL HIGH (ref 70–99)
Potassium: 3.1 mmol/L — ABNORMAL LOW (ref 3.5–5.1)
Sodium: 134 mmol/L — ABNORMAL LOW (ref 135–145)

## 2023-01-20 MED ORDER — FLEET ENEMA 7-19 GM/118ML RE ENEM
1.0000 | ENEMA | Freq: Every day | RECTAL | Status: DC | PRN
  Administered 2023-01-21: 1 via RECTAL
  Filled 2023-01-20: qty 1

## 2023-01-20 MED ORDER — IOHEXOL 300 MG/ML  SOLN
75.0000 mL | Freq: Once | INTRAMUSCULAR | Status: AC | PRN
  Administered 2023-01-20: 75 mL via INTRAVENOUS

## 2023-01-20 MED ORDER — CYANOCOBALAMIN 1000 MCG/ML IJ SOLN
1000.0000 ug | Freq: Once | INTRAMUSCULAR | Status: AC
  Administered 2023-01-20: 1000 ug via INTRAMUSCULAR
  Filled 2023-01-20: qty 1

## 2023-01-20 MED ORDER — POTASSIUM CHLORIDE CRYS ER 20 MEQ PO TBCR
40.0000 meq | EXTENDED_RELEASE_TABLET | Freq: Once | ORAL | Status: AC
  Administered 2023-01-20: 40 meq via ORAL
  Filled 2023-01-20: qty 2

## 2023-01-20 MED ORDER — NYSTATIN 100000 UNIT/ML MT SUSP
5.0000 mL | Freq: Four times a day (QID) | OROMUCOSAL | Status: DC
  Administered 2023-01-20 – 2023-01-28 (×28): 500000 [IU] via ORAL
  Filled 2023-01-20 (×30): qty 5

## 2023-01-20 MED ORDER — LORAZEPAM 1 MG PO TABS
1.0000 mg | ORAL_TABLET | ORAL | Status: DC | PRN
  Administered 2023-01-20 – 2023-01-23 (×7): 1 mg via ORAL
  Filled 2023-01-20 (×8): qty 1

## 2023-01-20 NOTE — Progress Notes (Signed)
Attempted to call patient's son but it went straight to voicemail Will see patient again on Monday Need to await biopsy results and obtain pelvic MRI to help plan next steps  Mary Sella. Andrey Campanile, MD, FACS General, Bariatric, & Minimally Invasive Surgery [facility],  [facility]

## 2023-01-20 NOTE — Consult Note (Addendum)
CC: I was passing blood  Requesting provider: Dr Ophelia Charter  HPI: [name] is an 89 y.o. female who is here for workup of worsening blood per rectum, right lower quadrant pain and distention.  Patient has a medical history consistent of hypertension, iron deficiency anemia and a remote history of CVA.  She states for about the past 2 months she has been having excessive amounts of flatus and passing of blood per rectum.  She states the blood was not in her stool.  At times she would get bloated and a little bit distended.  No nausea or vomiting.  She states that she has been tolerating a diet and eating well.  She denies any dysuria or hematuria.  She has had apparently history of intermittent GI bleeding and has a history of a small bowel AVM.  She also reports some constipation.  She describes her discomfort in the right lower abdomen as like a pulling sensation.  She lives with her son and his fiance.  She still does a lot of housework herself.  She does endorse some chest pressure but was told it may be from her bowels.  She denies any shortness of breath at rest.  She does endorse some dyspnea on exertion.  No orthopnea or paroxysmal nocturnal dyspnea.  No TIAs.  Prior appendectomy and cholecystectomy.  The right lower quadrant discomfort is new which started this week  Past Medical History:  Diagnosis Date   Anemia    Anxiety    Colon polyps    DDD (degenerative disc disease), cervical    Diabetes mellitus    Diabetes mellitus, type 2 (HCC)    Hyperlipidemia    Hypertension    Hypothyroidism    Leukemia (HCC) in remission   Mucous retention cyst of maxillary sinus    Osteoporosis    Pinched nerve In neck   Plantar fasciitis    Stroke [facility])    Vitamin D deficiency     Past Surgical History:  Procedure Laterality Date   APPENDECTOMY     CHOLECYSTECTOMY     2004   COLONOSCOPY N/A 02/24/2013   Procedure: COLONOSCOPY;  Surgeon: Meryl Dare, MD;  Location: [facility] ENDOSCOPY;   Service: Endoscopy;  Laterality: N/A;   COLONOSCOPY W/ POLYPECTOMY     COLONOSCOPY WITH PROPOFOL N/A 12/03/2017   Procedure: COLONOSCOPY WITH PROPOFOL;  Surgeon: Rachael Fee, MD;  Location: [facility] ENDOSCOPY;  Service: Gastroenterology;  Laterality: N/A;   ESOPHAGOGASTRODUODENOSCOPY     ESOPHAGOGASTRODUODENOSCOPY N/A 02/24/2013   Procedure: ESOPHAGOGASTRODUODENOSCOPY (EGD);  Surgeon: Meryl Dare, MD;  Location: [facility] ENDOSCOPY;  Service: Endoscopy;  Laterality: N/A;   ESOPHAGOGASTRODUODENOSCOPY (EGD) WITH PROPOFOL N/A 12/03/2017   Procedure: ESOPHAGOGASTRODUODENOSCOPY (EGD) WITH PROPOFOL;  Surgeon: Rachael Fee, MD;  Location: [facility] ENDOSCOPY;  Service: Gastroenterology;  Laterality: N/A;   EYE SURGERY     bilateral catracts    Family History  Problem Relation Age of Onset   Diabetes Other    Colon cancer Son    Esophageal cancer Neg Hx    Stomach cancer Neg Hx     Social:  reports that she has quit smoking. She has never used smokeless tobacco. She reports that she does not drink alcohol and does not use drugs.  Allergies:  Allergies  Allergen Reactions   Prednisone     Climbs the walls    Medications: I have reviewed the patient's current medications.   ROS - all of the below systems have been reviewed with  the patient and positives are indicated with bold text General: chills, fever or night sweats Eyes: blurry vision or double vision ENT: epistaxis or sore throat Allergy/Immunology: itchy/watery eyes or nasal congestion Hematologic/Lymphatic: bleeding problems, blood clots or swollen lymph nodes Endocrine: temperature intolerance or unexpected weight changes Breast: new or changing breast lumps or nipple discharge Resp: cough, shortness of breath, or wheezing CV: chest pain or dyspnea on exertion GI: as per HPI GU: dysuria, trouble voiding, or hematuria MSK: joint pain or joint stiffness Neuro: TIA or stroke symptoms Derm: pruritus and skin lesion changes Psych:  anxiety and depression  PE Blood pressure (!) 139/50, pulse 85, temperature 98 F (36.7 C), temperature source Oral, resp. rate 20, height 5' (1.524 m), weight 66.7 kg, SpO2 96 %. Constitutional: NAD; conversant; no deformities Eyes: Moist conjunctiva; no lid lag; anicteric; PERRL Neck: Trachea midline; no thyromegaly Lungs: Normal respiratory effort; no tactile fremitus; on oxygen CV: RRR; no palpable thrills; no pitting edema GI: Abd soft, nontender, little bit bloated but not terribly distended; no palpable hepatosplenomegaly Rectal: Chaperone present-bedside nurse-firm mass probably about 3 cm from the anal verge feels like it is more than 50% circumference fairly fixed. MSK: ; no clubbing/cyanosis Psychiatric: Appropriate affect; alert and oriented x3 Lymphatic: No palpable cervical or axillary lymphadenopathy Skin: No rash, lesions or  Results for orders placed or performed during the hospital encounter of 01/17/23 (from the past 48 hour(s))  Glucose, capillary     Status: Abnormal   Collection Time: 01/18/23 11:15 AM  Result Value Ref Range   Glucose-Capillary 117 (H) 70 - 99 mg/dL    Comment: Glucose reference range applies only to samples taken after fasting for at least 8 hours.  Glucose, capillary     Status: Abnormal   Collection Time: 01/18/23  4:21 PM  Result Value Ref Range   Glucose-Capillary 214 (H) 70 - 99 mg/dL    Comment: Glucose reference range applies only to samples taken after fasting for at least 8 hours.  Potassium     Status: None   Collection Time: 01/18/23  5:32 PM  Result Value Ref Range   Potassium 3.6 3.5 - 5.1 mmol/L    Comment: Performed at [facility], [address] [place], [place] [address]  Glucose, capillary     Status: Abnormal   Collection Time: 01/18/23  8:45 PM  Result Value Ref Range   Glucose-Capillary 147 (H) 70 - 99 mg/dL    Comment: Glucose reference range applies only to samples taken after fasting for at  least 8 hours.  Glucose, capillary     Status: None   Collection Time: 01/18/23 11:33 PM  Result Value Ref Range   Glucose-Capillary 91 70 - 99 mg/dL    Comment: Glucose reference range applies only to samples taken after fasting for at least 8 hours.  Glucose, capillary     Status: Abnormal   Collection Time: 01/19/23  4:34 AM  Result Value Ref Range   Glucose-Capillary 112 (H) 70 - 99 mg/dL    Comment: Glucose reference range applies only to samples taken after fasting for at least 8 hours.  CBC     Status: Abnormal   Collection Time: 01/19/23  5:40 AM  Result Value Ref Range   WBC 6.8 4.0 - 10.5 K/uL   RBC 3.91 3.87 - 5.11 MIL/uL   Hemoglobin 10.4 (L) 12.0 - 15.0 g/dL   HCT 60.4 (L) 54.0 - 98.1 %   MCV 84.4 80.0 -  100.0 fL   MCH 26.6 26.0 - 34.0 pg   MCHC 31.5 30.0 - 36.0 g/dL   RDW 16.1 09.6 - 04.5 %   Platelets 251 150 - 400 K/uL   nRBC 0.0 0.0 - 0.2 %    Comment: Performed at [facility], [address], [place], [place] [address]  Basic metabolic panel     Status: Abnormal   Collection Time: 01/19/23  5:40 AM  Result Value Ref Range   Sodium 139 135 - 145 mmol/L   Potassium 3.3 (L) 3.5 - 5.1 mmol/L   Chloride 104 98 - 111 mmol/L   CO2 29 22 - 32 mmol/L   Glucose, Bld 109 (H) 70 - 99 mg/dL    Comment: Glucose reference range applies only to samples taken after fasting for at least 8 hours.   BUN 5 (L) 8 - 23 mg/dL   Creatinine, Ser 1.91 0.44 - 1.00 mg/dL   Calcium 8.2 (L) 8.9 - 10.3 mg/dL   GFR, Estimated >47 >82 mL/min    Comment: (NOTE) Calculated using the CKD-EPI Creatinine Equation (2021)    Anion gap 6 5 - 15    Comment: Performed at [facility], [address], [place], [place] [address]  Glucose, capillary     Status: Abnormal   Collection Time: 01/19/23  7:30 AM  Result Value Ref Range   Glucose-Capillary 100 (H) 70 - 99 mg/dL    Comment: Glucose reference range applies only to samples taken after fasting for at  least 8 hours.  Glucose, capillary     Status: Abnormal   Collection Time: 01/19/23 11:26 AM  Result Value Ref Range   Glucose-Capillary 111 (H) 70 - 99 mg/dL    Comment: Glucose reference range applies only to samples taken after fasting for at least 8 hours.  Glucose, capillary     Status: Abnormal   Collection Time: 01/19/23  4:24 PM  Result Value Ref Range   Glucose-Capillary 113 (H) 70 - 99 mg/dL    Comment: Glucose reference range applies only to samples taken after fasting for at least 8 hours.  Glucose, capillary     Status: Abnormal   Collection Time: 01/19/23  7:56 PM  Result Value Ref Range   Glucose-Capillary 106 (H) 70 - 99 mg/dL    Comment: Glucose reference range applies only to samples taken after fasting for at least 8 hours.  Glucose, capillary     Status: Abnormal   Collection Time: 01/20/23 12:09 AM  Result Value Ref Range   Glucose-Capillary 100 (H) 70 - 99 mg/dL    Comment: Glucose reference range applies only to samples taken after fasting for at least 8 hours.  Glucose, capillary     Status: Abnormal   Collection Time: 01/20/23  4:18 AM  Result Value Ref Range   Glucose-Capillary 115 (H) 70 - 99 mg/dL    Comment: Glucose reference range applies only to samples taken after fasting for at least 8 hours.  Glucose, capillary     Status: Abnormal   Collection Time: 01/20/23  7:59 AM  Result Value Ref Range   Glucose-Capillary 105 (H) 70 - 99 mg/dL    Comment: Glucose reference range applies only to samples taken after fasting for at least 8 hours.   Comment 1 Notify RN   Basic metabolic panel     Status: Abnormal   Collection Time: 01/20/23  8:08 AM  Result Value Ref Range   Sodium 134 (L)  135 - 145 mmol/L   Potassium 3.1 (L) 3.5 - 5.1 mmol/L   Chloride 100 98 - 111 mmol/L   CO2 28 22 - 32 mmol/L   Glucose, Bld 109 (H) 70 - 99 mg/dL    Comment: Glucose reference range applies only to samples taken after fasting for at least 8 hours.   BUN 8 8 - 23 mg/dL    Creatinine, Ser 4.09 0.44 - 1.00 mg/dL   Calcium 8.0 (L) 8.9 - 10.3 mg/dL   GFR, Estimated >81 >19 mL/min    Comment: (NOTE) Calculated using the CKD-EPI Creatinine Equation (2021)    Anion gap 6 5 - 15    Comment: Performed at [facility], [address], [place], [place] [address]  CBC     Status: Abnormal   Collection Time: 01/20/23  8:08 AM  Result Value Ref Range   WBC 5.4 4.0 - 10.5 K/uL   RBC 3.59 (L) 3.87 - 5.11 MIL/uL   Hemoglobin 9.6 (L) 12.0 - 15.0 g/dL   HCT 95.6 (L) 21.3 - 08.6 %   MCV 87.2 80.0 - 100.0 fL   MCH 26.7 26.0 - 34.0 pg   MCHC 30.7 30.0 - 36.0 g/dL   RDW 57.8 46.9 - 62.9 %   Platelets 219 150 - 400 K/uL   nRBC 0.0 0.0 - 0.2 %    Comment: Performed at [facility], [address], [place], [place] [address]   Flex sig 01/19/23  No results found.  Echo 06/2017 - grade 2 diastolic dysfunction  March 2019 colonoscopy for rectal bleeding -- One 5 mm polyp in the transverse colon, removed with a cold snare. Resected and retrieved. - One 15 mm polyp in the very distal rectum, removed with a hot snare. Resected and retrieved. - One non-bleeding colonic angiodysplastic lesion. Treated with argon plasma coagulation (APC) for destruction, the lesion bled slightly during treatment. - The examination was otherwise normal on direct and retroflexion views. - The cecal AVM and/or the medium sized rectal polyp may have contributed to her anemia. The rectal polyp almost certainly explains her minor rectal bleeding     1. Colon, polyp(s), Transverse - TUBULAR ADENOMA. - NO HIGH GRADE DYSPLASIA OR MALIGNANCY. 2. Rectum, polyp(s) - TUBULAR ADENOMA. - NO HIGH GRADE DYSPLASIA OR MALIGNANCY.   March 2019 EGD - One small duodenal bulb AVM was found and destroyed with APC applicaiton. - Small hiatal hernia without associated erosions. - The duodenal AVM may have contributed to her anemia. Imaging: Personally reviewed  A/P: Shirleyann R  Cannedy is an 89 y.o. 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  Patient will also need a pelvic MRI Follow-up CT chest CT abdomen pelvis showed no signs of metastasis or overt lesions.  Pelvic MRI will allow [place] to determine depth and stage and develop treatment plan Oncology consult Replace vitamin B12 Work on nutrition since she has a low prealbumin-okay with a full liquid diet Cardiology assessment for risk stratification  Can have chemical vte prophylaxis from my standpoint  Attempted to call patient's son but it went straight to voicemail  We will see again on Monday  Data reviewed: Hospitalist H&P, gastroenterology consult note, progress notes for May night, May 10 and May 11.;  CT scan of her abdomen pelvis,  colonoscopy 2019, echocardiogram 2018, flexible sigmoidoscopy May 2024: Labs for the past 72 hours-hide medical decision making, high complexity medical problem since she has exacerbation of symptoms, extensive medical data review  Mary Sella. Andrey Campanile, MD, FACS General, Bariatric, & Minimally Invasive Surgery [facility]

## 2023-01-20 NOTE — Plan of Care (Signed)

## 2023-01-20 NOTE — Progress Notes (Signed)
OT Cancellation Note  Patient Details Name: [name] MRN: [id number] DOB: 1929-06-16   Cancelled Treatment:    Reason Eval/Treat Not Completed: Medical issues which prohibited therapy Patient with new onset of A fib. OT to continue to follow and check back as schedule will allow.  Rosalio Loud, MS Acute Rehabilitation Department Office# [phone]  01/20/2023, 11:31 AM

## 2023-01-20 NOTE — Consult Note (Signed)
Cardiology Consultation   Patient ID: [name] MRN: [id number]; DOB: 02-20-1929  Admit date: 01/17/2023 Date of Consult: 01/20/2023  PCP:  Christina Corn, MD   [facility] Providers Cardiologist:  New to [name]     Patient Profile:   [name] is a 89 y.o. female with a hx of anemia, new dx of colon cancer  who is being seen 01/20/2023 for the evaluation of prolonged sinus pauses  at the request of  Dr. Ophelia Charter. .  History of Present Illness:   [name] is a 89 yo with ix of constipation, anemia, DM Was noted to have long pauses - up to 10 seconds  I have reveiwed the tele and all of these are post conversion pauses from her converting from Atrial fib to NSR  She typically stays in sinus rhythm for several beats and then converts back to Afib   She is not a candidate for anticoagulation  She is not an any rate slowing meds.  Last echo was in 2018 Repeat echo   No hx of syncope,  no CP  No dyspnea Cannot feel any HR irregularities.       Past Medical History:  Diagnosis Date   Anemia    Anxiety    Colon polyps    DDD (degenerative disc disease), cervical    Diabetes mellitus    Diabetes mellitus, type 2 (HCC)    Hyperlipidemia    Hypertension    Hypothyroidism    Leukemia (HCC) in remission   Mucous retention cyst of maxillary sinus    Osteoporosis    Pinched nerve In neck   Plantar fasciitis    Stroke [facility])    Vitamin D deficiency     Past Surgical History:  Procedure Laterality Date   APPENDECTOMY     CHOLECYSTECTOMY     2004   COLONOSCOPY N/A 02/24/2013   Procedure: COLONOSCOPY;  Surgeon: Meryl Dare, MD;  Location: [facility] ENDOSCOPY;  Service: Endoscopy;  Laterality: N/A;   COLONOSCOPY W/ POLYPECTOMY     COLONOSCOPY WITH PROPOFOL N/A 12/03/2017   Procedure: COLONOSCOPY WITH PROPOFOL;  Surgeon: Rachael Fee, MD;  Location[facility] ENDOSCOPY;  Service: Gastroenterology;  Laterality: N/A;   ESOPHAGOGASTRODUODENOSCOPY      ESOPHAGOGASTRODUODENOSCOPY N/A 02/24/2013   Procedure: ESOPHAGOGASTRODUODENOSCOPY (EGD);  Surgeon: Meryl Dare, MD;  Location: [facility] ENDOSCOPY;  Service: Endoscopy;  Laterality: N/A;   ESOPHAGOGASTRODUODENOSCOPY (EGD) WITH PROPOFOL N/A 12/03/2017   Procedure: ESOPHAGOGASTRODUODENOSCOPY (EGD) WITH PROPOFOL;  Surgeon: Rachael Fee, MD;  Location: [facility] ENDOSCOPY;  Service: Gastroenterology;  Laterality: N/A;   EYE SURGERY     bilateral catracts     Home Medications:  Prior to Admission medications   Medication Sig Start Date End Date Taking? Authorizing Provider  ALPRAZolam Prudy Feeler) 0.5 MG tablet Take 0.5 mg by mouth 3 (three) times daily as needed for anxiety. For anxiety   Yes [provider]  amLODipine (NORVASC) 2.5 MG tablet Take 10 mg by mouth daily.    Yes [provider]  atorvastatin (LIPITOR) 20 MG tablet Take 20 mg by mouth daily.   Yes [provider]  levothyroxine (SYNTHROID) 125 MCG tablet Take 125 mcg by mouth daily before breakfast.   Yes [provider]  olmesartan-hydrochlorothiazide (BENICAR HCT) 40-25 MG tablet Take 1 tablet by mouth daily. 10/15/17  Yes [provider]  pantoprazole (PROTONIX) 40 MG tablet Take 40 mg by mouth daily.   Yes [provider]  potassium chloride  SA (K-DUR,KLOR-CON) 20 MEQ tablet Take 20 mEq by mouth daily.   Yes [provider]  albuterol (PROVENTIL HFA;VENTOLIN HFA) 108 (90 Base) MCG/ACT inhaler Inhale 1-2 puffs into the lungs every 6 (six) hours as needed for wheezing or shortness of breath. Patient not taking: Reported on 01/17/2023 12/05/17   Azalia Bilis, MD  cholecalciferol (VITAMIN D3) 25 MCG (1000 UNIT) tablet Take 1,000 Units by mouth daily.    [provider]  Diclofenac Sodium CR 100 MG 24 hr tablet Take 1 tablet (100 mg total) by mouth daily. Patient not taking: Reported on 01/15/2023 06/29/20   Palumbo, April, MD  ferrous sulfate 325 (65 FE) MG EC tablet Take 1 tablet  (325 mg total) by mouth daily with breakfast. Patient not taking: Reported on 01/15/2023 12/04/17   Burnadette Pop, MD  lidocaine (LIDODERM) 5 % Place 1 patch onto the skin daily. Remove & Discard patch within 12 hours or as directed by MD Patient not taking: Reported on 01/15/2023 06/29/20   Palumbo, April, MD  pantoprazole (PROTONIX) 40 MG tablet Take 1 tablet (40 mg total) by mouth daily. 12/04/17 01/15/23  Burnadette Pop, MD    Inpatient Medications: Scheduled Meds:  atorvastatin  20 mg Oral Daily   feeding supplement  1 Container Oral TID BM   insulin aspart  0-9 Units Subcutaneous Q4H   levothyroxine  125 mcg Oral Q0600   pantoprazole  40 mg Oral Daily   polyethylene glycol  17 g Oral Daily   Continuous Infusions:  PRN Meds: acetaminophen **OR** acetaminophen, albuterol, ALPRAZolam, amisulpride, guaiFENesin-dextromethorphan, HYDROcodone-acetaminophen, methocarbamol, ondansetron **OR** ondansetron (ZOFRAN) IV, ondansetron (ZOFRAN) IV  Allergies:    Allergies  Allergen Reactions   Prednisone     Climbs the walls    Social History:   Social History   Socioeconomic History   Marital status: Widowed    Spouse name: Not on file   Number of children: 3   Years of education: Not on file   Highest education level: Not on file  Occupational History   Occupation: retired  Tobacco Use   Smoking status: Former   Smokeless tobacco: Never  Building services engineer Use: Former  Substance and Sexual Activity   Alcohol use: No   Drug use: No   Sexual activity: Not Currently  Other Topics Concern   Not on file  Social History Narrative   Not on file   Social Determinants of Health   Financial Resource Strain: Not on file  Food Insecurity: No Food Insecurity (01/18/2023)   Hunger Vital Sign    Worried About Running Out of Food in the Last Year: Never true    Ran Out of Food in the Last Year: Never true  Transportation Needs: No Transportation Needs (01/18/2023)   PRAPARE -  Administrator, Civil Service (Medical): No    Lack of Transportation (Non-Medical): No  Physical Activity: Not on file  Stress: Not on file  Social Connections: Not on file  Intimate Partner Violence: Not At Risk (01/18/2023)   Humiliation, Afraid, Rape, and Kick questionnaire    Fear of Current or Ex-Partner: No    Emotionally Abused: No    Physically Abused: No    Sexually Abused: No    Family History:    Family History  Problem Relation Age of Onset   Diabetes Other    Colon cancer Son    Esophageal cancer Neg Hx    Stomach cancer Neg Hx  ROS:  Please see the history of present illness.   All other ROS reviewed and negative.     Physical Exam/Data:   Vitals:   01/19/23 1350 01/19/23 1515 01/19/23 2000 01/20/23 0421  BP:  (!) 156/68 (!) 174/68 (!) 139/50  Pulse:  91 92 85  Resp:  17 18 20   Temp:  (!) 100.6 F (38.1 C) 97.9 F (36.6 C) 98 F (36.7 C)  TempSrc:  Oral Oral Oral  SpO2: 96% 95% 93% 96%  Weight:      Height:        Intake/Output Summary (Last 24 hours) at 01/20/2023 1136 Last data filed at 01/19/2023 1335 Gross per 24 hour  Intake 311.17 ml  Output --  Net 311.17 ml      01/17/2023    7:14 PM 01/15/2023   11:26 AM 11/04/2022    7:06 PM  Last 3 Weights  Weight (lbs) 147 lb 147 lb 152 lb  Weight (kg) 66.679 kg 66.679 kg 68.947 kg     Body mass index is 28.71 kg/m.  General:  elderly female,  NAD  HEENT: normal Neck: no JVD Vascular: No carotid bruits; Distal pulses 2+ bilaterally Cardiac:  normal S1, S2; Reg rate  2/6 systolic murmur  Lungs:  clear to auscultation bilaterally, no wheezing, rhonchi or rales  Abd: soft, nontender, no hepatomegaly  Ext: no edema Musculoskeletal:  No deformities, BUE and BLE strength normal and equal Skin: warm and dry  Neuro:  CNs 2-12 intact, no focal abnormalities noted Psych:  Normal affect   EKG:  The EKG was personally reviewed and demonstrates:     01/18/23 :  NSR   Telemetry:   Telemetry was personally reviewed and demonstrates:  mostly atrial fib with post conversion pauses .   These pauses are up to 10 seconds     Relevant CV Studies:   Laboratory Data:  High Sensitivity Troponin:  No results for input(s): "TROPONINIHS" in the last 720 hours.   Chemistry Recent Labs  Lab 01/17/23 1948 01/18/23 0532 01/18/23 1732 01/19/23 0540 01/20/23 0808  NA  --  136  --  139 134*  K  --  2.9* 3.6 3.3* 3.1*  CL  --  103  --  104 100  CO2  --  28  --  29 28  GLUCOSE  --  123*  --  109* 109*  BUN  --  9  --  5* 8  CREATININE  --  0.62  --  0.63 0.62  CALCIUM  --  8.0*  --  8.2* 8.0*  MG 1.6* 1.6*  --   --   --   GFRNONAA  --  >60  --  >60 >60  ANIONGAP  --  5  --  6 6    Recent Labs  Lab 01/17/23 1940 01/18/23 0532  PROT 6.7 5.6*  ALBUMIN 3.5 3.0*  AST 18 14*  ALT 19 16  ALKPHOS 69 50  BILITOT 0.7 0.8   Lipids No results for input(s): "CHOL", "TRIG", "HDL", "LABVLDL", "LDLCALC", "CHOLHDL" in the last 168 hours.  Hematology Recent Labs  Lab 01/18/23 0532 01/19/23 0540 01/20/23 0808  WBC 4.9 6.8 5.4  RBC 3.58* 3.91 3.59*  HGB 9.5* 10.4* 9.6*  HCT 29.8* 33.0* 31.3*  MCV 83.2 84.4 87.2  MCH 26.5 26.6 26.7  MCHC 31.9 31.5 30.7  RDW 13.9 14.2 14.3  PLT 236 251 219   Thyroid  Recent Labs  Lab 01/17/23 2230  TSH  0.831    BNPNo results for input(s): "BNP", "PROBNP" in the last 168 hours.  DDimer No results for input(s): "DDIMER" in the last 168 hours.   Radiology/Studies:  DG Chest Port 1 View  Result Date: 01/17/2023 CLINICAL DATA:  Chest pain EXAM: PORTABLE CHEST 1 VIEW COMPARISON:  12/05/2017 FINDINGS: Stable cardiomediastinal silhouette. Aortic atherosclerotic calcification. Diffuse interstitial coarsening greatest in the lower lungs. No focal consolidation, pleural effusion, pneumothorax. No definite displaced rib fractures. IMPRESSION: No active disease. Interstitial coarsening in the lower lungs is similar to 12/05/2017. Electronically  Signed   By: Minerva Fester M.D.   On: 01/17/2023 21:24   CT ABDOMEN PELVIS W CONTRAST  Result Date: 01/17/2023 CLINICAL DATA:  Abdominal pain and distention. Side GI on Monday and was impacted and given laxatives. Has had multiple bowel movements since. EXAM: CT ABDOMEN AND PELVIS WITH CONTRAST TECHNIQUE: Multidetector CT imaging of the abdomen and pelvis was performed using the standard protocol following bolus administration of intravenous contrast. RADIATION DOSE REDUCTION: This exam was performed according to the departmental dose-optimization program which includes automated exposure control, adjustment of the mA and/or kV according to patient size and/or use of iterative reconstruction technique. CONTRAST:  80mL OMNIPAQUE IOHEXOL 300 MG/ML  SOLN COMPARISON:  Abdominal ultrasound 07/12/2017 and report from CT abdomen and pelvis 01/20/2019 FINDINGS: Lower chest: No acute abnormality. Hepatobiliary: Cholecystectomy. No focal hepatic lesion. Post cholecystectomy prominence of the common bile duct. Pancreas: Unremarkable. Spleen: Unremarkable. Adrenals/Urinary Tract: Normal adrenal glands. No urinary calculi or hydronephrosis. Unremarkable bladder. Stomach/Bowel: Normal caliber large and small bowel. No bowel wall thickening. The appendix is not visualized. Stomach is within normal limits. Duodenal diverticulum. Vascular/Lymphatic: Aortic atherosclerotic calcification. Calcification at the origins of the celiac axis and SMA cause moderate narrowing. 1.9 cm left common iliac artery ectasia. No lymphadenopathy. Reproductive: Uterus and bilateral adnexa are unremarkable. Other: No free intraperitoneal fluid or air. Musculoskeletal: No acute fracture. IMPRESSION: 1. No acute findings in the abdomen or pelvis. Aortic Atherosclerosis (ICD10-I70.0). Electronically Signed   By: Minerva Fester M.D.   On: 01/17/2023 21:06     Assessment and Plan:   Sinus pauses :  pt is currently in NSR .  Over night she has had  several episodes of atrial fibrillation.  She she has postconversion pauses which are as long as 10 seconds.  Sometimes she will go right back in atrial fibrillation.  At present she is in normal sinus rhythm and is asymptomatic.  I would like to get an echocardiogram for further evaluation of her LV function and to further evaluate her mitral valve and aortic valve.  She had mild to moderate mitral regurgitation by echo in 2018.  She also has some aortic sclerosis and may have aortic stenosis.  If she is otherwise a candidate for rectal/ colon surgery, I would recommend placing a transcutaneous pacemaker which can be turned on if she has prolonged postconversion pause.  As long as she is in sinus rhythm I think that she will not need any pacing.  Following      Risk Assessment/Risk Scores:   [id number]}        CHA2DS2-VASc Score = 5   This indicates a 7.2% annual risk of stroke. The patient's score is based upon: CHF History: 0 HTN History: 1 Diabetes History: 1 Stroke History: 0 Vascular Disease History: 0 Age Score: 2 Gender Score: 1         For questions or updates, please contact [facility] Please consult [email or web address]  for contact info under    Signed, Kristeen Miss, MD  01/20/2023 11:36 AM

## 2023-01-20 NOTE — Progress Notes (Addendum)
    Progress Note   Assessment    Partially obstructing rectal mass causing hematochezia and constipation, suspected adenocarcinoma  Normocytic anemia, stable Hypokalemia   Recommendations   Chest CT today Oncology and Surgery consults pending Await rectal mass biopsy results Continue Miralax bid GI will see again on Monday, please call for questions, problems    Chief Complaint   Upset about diagnosis yesterday. Having several second pauses on telemetry.  Vital signs in last 24 hours: Temp:  [97.9 F (36.6 C)-100.6 F (38.1 C)] 98 F (36.7 C) (05/11 0421) Pulse Rate:  [77-96] 85 (05/11 0421) Resp:  [17-20] 20 (05/11 0421) BP: (111-179)/(34-68) 139/50 (05/11 0421) SpO2:  [93 %-100 %] 96 % (05/11 0421) Last BM Date : 01/19/23  General: Alert, well-developed, elderly, in NAD Heart:  Regular rate and rhythm; no murmurs Chest: Clear to ascultation bilaterally Abdomen:  Soft, nontender and nondistended. Normal bowel sounds, without guarding, and without rebound.   Extremities:  Without edema. Neurologic:  Alert and  oriented x4; grossly normal neurologically. Psych:  Alert and cooperative. Normal mood and affect.  Intake/Output from previous day: 05/10 0701 - 05/11 0700 In: 311.2 [I.V.:311.2] Out: -  Intake/Output this shift: No intake/output data recorded.  Lab Results: Recent Labs    01/18/23 0532 01/19/23 0540 01/20/23 0808  WBC 4.9 6.8 5.4  HGB 9.5* 10.4* 9.6*  HCT 29.8* 33.0* 31.3*  PLT 236 251 219   BMET Recent Labs    01/17/23 1940 01/18/23 0532 01/18/23 1732 01/19/23 0540  NA 134* 136  --  139  K 2.5* 2.9* 3.6 3.3*  CL 96* 103  --  104  CO2 28 28  --  29  GLUCOSE 203* 123*  --  109*  BUN 12 9  --  5*  CREATININE 0.69 0.62  --  0.63  CALCIUM 8.7* 8.0*  --  8.2*   LFT Recent Labs    01/18/23 0532  PROT 5.6*  ALBUMIN 3.0*  AST 14*  ALT 16  ALKPHOS 50  BILITOT 0.8     LOS: 2 days   Tillman Kazmierski T. Russella Dar, MD 01/20/2023, 9:20 AM See  [facility], to contact our on call provider

## 2023-01-20 NOTE — Progress Notes (Addendum)
Progress Note   Patient: [name] WUJ:[id number] DOB: 1929/08/16 DOA: 01/17/2023     2 DOS: the patient was seen and examined on 01/20/2023   Brief hospital course:  89 year old female with history of intermittent GI bleed secondary to AVMs, GERD, hypothyroidism who presented to the emergency department for the complaint of worsening abdominal pain, distention, nausea, vomiting, bright red blood per rectum.  Patient's family called gastroenterology office and was suggested to go to the emergency department.  Also reported to have frequent bowel movement after she was recently started on MiraLAX.  Has history of small bowel/colon AVMs.  Noted to have blood on the toilet paper.  On presentation, she was hemodynamically stable.  Lab work showed severe hypokalemia with potassium of 2.5, magnesium of 1.6, hemoglobin of 11.4.  Anemia panel showed low iron of 25.  GI consulted - thinks symptoms are related to encoparesis/overflow from constipation.  There is a rectal mass noted, underwent flex sig with enema prep.  Unfortunately, this showed a rectal mass 2 cm from the anal verge and malignant partially obstructing tumor in the mid rectum and in the distal rectum that was biopsied (path pending).  Her son is undergoing treatment for stage 4 rectal cancer.  Assessment and Plan:  Rectal mass -Patient presented with abdominal pain, n/v -There was concern for constipation that did not improve with Miralax, eventually cleaned out, on BID Miralax -While undergoing evaluation, she was noted to have a firm peri-rectal mass and flex sig was performed -Flex sig showed: Rectal mass 2. 0 cm from the anal verge. 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 Biopsied. - Diverticulosis in the sigmoid colon. -She has completed a CT A/P and is now scheduled for CT chest as well -Her son is undergoing treatment at this time for stage 4 colon CA -She is very upset about this  diagnosis -Oncology consulted, will likely await biopsy and see Monday -Surgery consulted - needs pelvic MRI *NOTE:  Message received from radiology that iron infusions severely degrade MRI images and can cause artifacts, false diagnosis and minimum timeframe between infusion and MRI imaging of 1 week is recommended.  I discussed with Dr. Ty Hilts from radiology and he approves MRI, thinks it is unlikely to significantly alter the reading and affect her staging.   Iron deficiency anemia -History of AVMs,hemorrhoids.  -Report of blood red blood per rectum, seen on toilet paper.  -Hgb stable -Anemia panel showed low iron, given IV iron.     Hypokalemia/hypomagnesemia -Currently being monitored and supplemented   Respiratory insufficiency -Not on oxygen at home.   -Currently requiring 2 L of oxygen.   -Chest x-ray did not show any acute findings,showed chronic interstitial coarsening in the lower lungs  -She has a 20+ pack year smoking history -Wean as tolerated -Encourage ambulation -IS   Sinus pauses -Considered transferring to med surg the last 2 days but RN reports sinus pauses up to 12 seconds -Cardiology consulted -Per Dr. Elease Hashimoto, it appears that she is going in and out of afib and these pauses are associated with conversion -She is not a candidate for [facility] at this time -His recommendation is likely for external pacing at the time of surgery only if needed -Will defer to him about telemetry, as there does not appear to be much to do about the pauses -Needs updated echo   Hyperlipidemia -Continue Lipitor   Hypothyroidism -Continue Synthroid   Anxiety -She has been taking alprazolam  but is feeling very anxious -Will change to lorazepam for a little longer relief and also increase to q4h prn for now   HTN -Hold olmesartan, HCTZ for now   Malnutrition Nutrition Problem: Moderate Malnutrition Etiology: acute illness (abdominal pain) Signs/Symptoms: mild fat depletion, mild  muscle depletion Interventions: Boost Breeze   Goals of care -Reports she is independent, still cleans house and performs ADLs -Full code      Subjective: She is very sad about her rectal cancer, particularly with her son's diagnosis.  No specific complaints, just really worried.  She is having sinus pauses on telemetry without chest pain or palpitations.  Physical Exam: Vitals:   01/19/23 1515 01/19/23 2000 01/20/23 0421 01/20/23 1236  BP: (!) 156/68 (!) 174/68 (!) 139/50 (!) 125/58  Pulse: 91 92 85 85  Resp: 17 18 20 18   Temp: (!) 100.6 F (38.1 C) 97.9 F (36.6 C) 98 F (36.7 C) 98 F (36.7 C)  TempSrc: Oral Oral Oral Oral  SpO2: 95% 93% 96% 97%  Weight:      Height:       General:  Appears calm and comfortable and is in NAD, on 2L [place] O2 Eyes:   EOMI, normal lids, iris ENT:  grossly normal hearing, lips & tongue, whitish tongue plaque that is concerning for thrush but no palatal lesions Neck:  no LAD, masses or thyromegaly Cardiovascular:  RRR, no m/r/g. No LE edema.  Respiratory:   CTA bilaterally with no wheezes/rales/rhonchi.  Normal respiratory effort. Abdomen:  soft, NT, ND Skin:  no rash or induration seen on limited exam Musculoskeletal:  grossly normal tone BUE/BLE, good ROM, no bony abnormality Psychiatric:  blunted/labile mood and affect, speech fluent and appropriate, AOx3 Neurologic:  CN 2-12 grossly intact, moves all extremities in coordinated fashion    Radiological Exams on Admission: Independently reviewed - see discussion in A/P where applicable  No results found.  EKG: last on 5/8   Labs on Admission: I have personally reviewed the available labs and imaging studies at the time of the admission.  Pertinent labs:    K+ 3.1 WBC 5.4 Hgb 9.6  Family Communication: None present during today's evaluation but we talked at length yesterday  Disposition: Status is: Inpatient Remains inpatient appropriate because: ongoing evaluation and  treatment  Planned Discharge Destination: Home    Time spent: 50 minutes  Author: Jonah Blue, MD 01/20/2023 6:40 PM  For on call review [email or web address].

## 2023-01-20 NOTE — Progress Notes (Signed)
Oncology short note  Case was discussed with Dr Ophelia Charter. 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 Biopsy results pending. CEA add on ordered Surgery/cardiology and GI involved. Would recommend consideration of IV iron replacement. Will official consult once pathology results are available. If patient has uncontrolled bleeding or obstructive symptomatology will need initial surgical consideration.  Wyvonnia Lora MD MS

## 2023-01-21 ENCOUNTER — Inpatient Hospital Stay (HOSPITAL_COMMUNITY): Payer: Medicare PPO

## 2023-01-21 DIAGNOSIS — E876 Hypokalemia: Secondary | ICD-10-CM | POA: Diagnosis not present

## 2023-01-21 DIAGNOSIS — I4891 Unspecified atrial fibrillation: Secondary | ICD-10-CM | POA: Diagnosis not present

## 2023-01-21 DIAGNOSIS — I480 Paroxysmal atrial fibrillation: Secondary | ICD-10-CM | POA: Diagnosis not present

## 2023-01-21 LAB — BASIC METABOLIC PANEL
Anion gap: 7 (ref 5–15)
BUN: 7 mg/dL — ABNORMAL LOW (ref 8–23)
CO2: 28 mmol/L (ref 22–32)
Calcium: 8.3 mg/dL — ABNORMAL LOW (ref 8.9–10.3)
Chloride: 101 mmol/L (ref 98–111)
Creatinine, Ser: 0.59 mg/dL (ref 0.44–1.00)
GFR, Estimated: >60 mL/min (ref >60)
Glucose, Bld: 116 mg/dL — ABNORMAL HIGH (ref 70–99)
Potassium: 3.5 mmol/L (ref 3.5–5.1)
Sodium: 136 mmol/L (ref 135–145)

## 2023-01-21 LAB — GLUCOSE, CAPILLARY
Glucose-Capillary: 106 mg/dL — ABNORMAL HIGH (ref 70–99)
Glucose-Capillary: 112 mg/dL — ABNORMAL HIGH (ref 70–99)
Glucose-Capillary: 113 mg/dL — ABNORMAL HIGH (ref 70–99)
Glucose-Capillary: 115 mg/dL — ABNORMAL HIGH (ref 70–99)
Glucose-Capillary: 145 mg/dL — ABNORMAL HIGH (ref 70–99)
Glucose-Capillary: 191 mg/dL — ABNORMAL HIGH (ref 70–99)
Glucose-Capillary: 95 mg/dL (ref 70–99)

## 2023-01-21 LAB — ECHOCARDIOGRAM COMPLETE
AR max vel: 0.76 cm2
AV Area VTI: 0.91 cm2
AV Area mean vel: 0.83 cm2
AV Mean grad: 10 mmHg
AV Peak grad: 19.7 mmHg
Ao pk vel: 2.22 m/s
Area-P 1/2: 3.46 cm2
Height: 60 in
MV M vel: 5.95 m/s
MV Peak grad: 141.6 mmHg
S' Lateral: 2.6 cm
Single Plane A4C EF: 73.3 %
Weight: 2352 oz

## 2023-01-21 LAB — CBC
HCT: 29.8 % — ABNORMAL LOW (ref 36.0–46.0)
Hemoglobin: 9.3 g/dL — ABNORMAL LOW (ref 12.0–15.0)
MCH: 26.3 pg (ref 26.0–34.0)
MCHC: 31.2 g/dL (ref 30.0–36.0)
MCV: 84.4 fL (ref 80.0–100.0)
Platelets: 216 10*3/uL (ref 150–400)
RBC: 3.53 MIL/uL — ABNORMAL LOW (ref 3.87–5.11)
RDW: 14.2 % (ref 11.5–15.5)
WBC: 5.3 10*3/uL (ref 4.0–10.5)
nRBC: 0 % (ref 0.0–0.2)

## 2023-01-21 MED ORDER — MORPHINE SULFATE (PF) 2 MG/ML IV SOLN
2.0000 mg | INTRAVENOUS | Status: DC | PRN
  Administered 2023-01-21: 2 mg via INTRAVENOUS
  Filled 2023-01-21: qty 1

## 2023-01-21 MED ORDER — VITAMIN B-12 1000 MCG PO TABS
1000.0000 ug | ORAL_TABLET | Freq: Every day | ORAL | Status: DC
  Administered 2023-01-21 – 2023-01-28 (×8): 1000 ug via ORAL
  Filled 2023-01-21 (×9): qty 1

## 2023-01-21 NOTE — Progress Notes (Signed)
PROGRESS NOTE    [name]  ZOX:[id number] DOB: 25-Oct-1928 DOA: 01/17/2023 PCP: Creola Corn, MD    Brief Narrative:   [name] is a 89 y.o. female with past medical history significant for intermittent GI bleed secondary to AVM, GERD, hypothyroidism who presented to [facility] ED on 5/8 with abdominal pain, distention, nausea/vomiting and bright red blood per rectum.  Family called her GI office and was encouraged to go to the ED for further evaluation.  Recently started on MiraLAX for fecal impaction now with with increased frequency of bowel movements.  In the ED, temperature 98.2 F, HR 90, RR 17, SpO2 90% on room air.  WBC 5.5, hemoglobin 11.4, platelet count 276.  Sodium 134, potassium 2.5, chloride 96, CO2 28, glucose 203, BUN 12, creatinine 0.69.  Lipase 34, AST 18, ALT 19, total bilirubin 0.7.  CT abdomen/pelvis with contrast on 5/8 with no acute findings.  GI was consulted.  TRH consulted for admission for further evaluation and management.  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  Recently seen by GI outpatient with fecal impaction and started on MiraLAX.  CT abdomen/pelvis with contrast on admission with no significant finding.  Initially thought to be related to encopresis/overflow from constipation; although rectal mass palpated on DRE by GI.  Patient underwent flexible sigmoidoscopy by Dr. Rhea Belton on 01/20/2023 with findings of partially obstructing tumor in the mid/distal rectum and biopsy was taken.  CT chest with contrast with spiculated nodule right upper lobe.  MR pelvis 5/12 with large polypoid tumor in distal rectum along right wall classified as T3b N1 -- General surgery, medical oncology, GI following; appreciate assistance -- CEA: Pending -- Pathology from rectal biopsy: Pending -- Norco 1-2  tablets every 4 hours as needed moderate pain -- Morphine 2 mg IV every 3 hours as needed severe pain -- Robaxin 500 mg p.o. every 8 hours as needed muscle spasms -- Full liquid diet -- Further per specialists  Hypokalemia Hypomagnesemia Potassium 2.5, magnesium 1.6 on admission, repleted. -- Repeat electrolytes in a.m.  Iron/B12 deficiency anemia Anemia panel with iron 25, TIBC 378, ferritin 5, folate 11.8, vitamin B12 158. -- Received IV iron on 5/9 and 5/10 -- Vitamin B12 1000 mcg p.o. daily  Anxiety -- Lorazepam 1 mg p.o. every 4 hours as needed anxiety  Paroxysmal atrial fibrillation with postconversion pauses -- Cardiology following -- TTE: Pending -- Continue to monitor on telemetry  History of GIB secondary to AVMs -- Hgb 11.4>>9.6>9.4; stable -- CBC daily, transfuse for hemoglobin less than 7.0 or active bleeding  Hypothyroidism -- Levothyroxine 125 mcg p.o. daily  GERD -- Protonix 40 mg p.o. daily   DVT prophylaxis: SCDs Start: 01/18/23 0111    Code Status: Full Code Family Communication: Updated patient's daughter-in-law present at bedside  Disposition Plan:  Level of care: Med-Surg Status is: Inpatient Remains inpatient appropriate because: Pending further recommendations per cardiology, GI, general surgery, medical oncology    Consultants:  [place] gastro[facility]ology General surgery Cardiology Medical oncology  Procedures:  Flex sigmoidoscopy, Dr. Rhea Belton 5/11 TTE: Pending  Antimicrobials:  None   Subjective: Patient seen examined at bedside, resting comfortably.  Sitting at edge of bed.  Daughter-in-law present.  Just returned from MRI of her pelvis.  Having pain/anxiety with attempted enema this morning treated with IV morphine.  Continues to be  anxious regarding diagnosis.  Pathology is pending.  TTE planned for today.  Continues with intermittent pauses on telemetry.  No other specific complaints or concerns at this time.  Denies headache, no  dizziness, no chest pain, no palpitations, no current abdominal pain, no fever, no nausea/vomiting.  No acute concerns overnight per nurse staff.  Objective: Vitals:   01/20/23 2004 01/20/23 2200 01/21/23 0000 01/21/23 0419  BP: (!) 157/71   (!) 135/53  Pulse: (!) 102   82  Resp: 20 18 17 20   Temp: 98.9 F (37.2 C)   99.1 F (37.3 C)  TempSrc: Oral   Oral  SpO2: 93% 95%  99%  Weight:      Height:        Intake/Output Summary (Last 24 hours) at 01/21/2023 1306 Last data filed at 01/21/2023 0000 Gross per 24 hour  Intake 358 ml  Output --  Net 358 ml   Filed Weights   01/17/23 1914  Weight: 66.7 kg    Examination:  Physical Exam: GEN: NAD, alert and oriented x 3, chronically ill/elderly in appearance HEENT: NCAT, PERRL, EOMI, sclera clear, MMM, poor dentition PULM: CTAB w/o wheezes/crackles, normal respiratory effort, on 1 L nasal cannula CV: RRR w/o M/G/R GI: abd soft, NTND, NABS, no R/G/M MSK: no peripheral edema, moves all extremities independently NEURO: No focal neurological deficits PSYCH: normal mood/affect Integumentary: dry/intact, no rashes or wounds    Data Reviewed: I have personally reviewed following labs and imaging studies  CBC: Recent Labs  Lab 01/15/23 1230 01/17/23 1940 01/18/23 0532 01/19/23 0540 01/20/23 0808 01/21/23 0453  WBC 8.0 5.5 4.9 6.8 5.4 5.3  NEUTROABS 4.4 3.2  --   --   --   --   HGB 12.2 11.4* 9.5* 10.4* 9.6* 9.3*  HCT 36.4 34.6* 29.8* 33.0* 31.3* 29.8*  MCV 78.8 80.5 83.2 84.4 87.2 84.4  PLT 299.0 267 236 251 219 216   Basic Metabolic Panel: Recent Labs  Lab 01/17/23 1940 01/17/23 1948 01/18/23 0532 01/18/23 1732 01/19/23 0540 01/20/23 0808 01/21/23 0453  NA 134*  --  136  --  139 134* 136  K 2.5*  --  2.9* 3.6 3.3* 3.1* 3.5  CL 96*  --  103  --  104 100 101  CO2 28  --  28  --  29 28 28   GLUCOSE 203*  --  123*  --  109* 109* 116*  BUN 12  --  9  --  5* 8 7*  CREATININE 0.69  --  0.62  --  0.63 0.62 0.59   CALCIUM 8.7*  --  8.0*  --  8.2* 8.0* 8.3*  MG  --  1.6* 1.6*  --   --   --   --   PHOS  --  3.1 3.2  --   --   --   --    GFR: Estimated Creatinine Clearance: 39 mL/min (by C-G formula based on SCr of 0.59 mg/dL). Liver Function Tests: Recent Labs  Lab 01/17/23 1940 01/18/23 0532  AST 18 14*  ALT 19 16  ALKPHOS 69 50  BILITOT 0.7 0.8  PROT 6.7 5.6*  ALBUMIN 3.5 3.0*   Recent Labs  Lab 01/17/23 1940  LIPASE 34   No results for input(s): "AMMONIA" in the last 168 hours. Coagulation Profile: No results for input(s): "INR", "PROTIME" in the last 168 hours. Cardiac Enzymes: Recent Labs  Lab 01/17/23 1948  CKTOTAL 66   BNP (last 3 results) No  results for input(s): "PROBNP" in the last 8760 hours. HbA1C: No results for input(s): "HGBA1C" in the last 72 hours. CBG: Recent Labs  Lab 01/20/23 1959 01/21/23 0002 01/21/23 0416 01/21/23 0748 01/21/23 1228  GLUCAP 166* 113* 115* 106* 191*   Lipid Profile: No results for input(s): "CHOL", "HDL", "LDLCALC", "TRIG", "CHOLHDL", "LDLDIRECT" in the last 72 hours. Thyroid Function Tests: No results for input(s): "TSH", "T4TOTAL", "FREET4", "T3FREE", "THYROIDAB" in the last 72 hours. Anemia Panel: No results for input(s): "VITAMINB12", "FOLATE", "FERRITIN", "TIBC", "IRON", "RETICCTPCT" in the last 72 hours. Sepsis Labs: Recent Labs  Lab 01/17/23 2236 01/18/23 0532  LATICACIDVEN 0.7 0.9    No results found for this or any previous visit (from the past 240 hour(s)).       Radiology Studies: MR PELVIS WO CM RECTAL CA STAGING  Result Date: 01/21/2023 CLINICAL DATA:  Potential rectal carcinoma. No rectal mass on colonoscopy. EXAM: MRI PELVIS WITHOUT CONTRAST TECHNIQUE: Multiplanar multisequence MR imaging of the pelvis was performed. No intravenous contrast was administered. Ultrasound gel was administered per rectum to optimize tumor evaluation. COMPARISON:  CT 01/17/2023 FINDINGS: Patient was moving during exam. Best  images obtained with this limitation TUMOR LOCATION Large polypoid tumor in the distal rectum along the RIGHT wall. Rectal mass mass extends from 12:00 to 6:00 (image 4/12). The tumor extends essentially to the internal anal sphincter. Tumor distance from Anal Verge/Skin surface: 3.5 cm Tumor distance to Internal Anal sphincter: 0mm TUMOR DESCRIPTION Circumferential extent: 180 degrees along the RIGHT margin from 12:00 to 6:00 (image 14/series 4 Tumor Size and volume: 4 cm in length T - CATEGORY Extension through Muscularis Propria: T3 B. Tumor extends through the muscularis propria approximately 5 mm (image 16/10). This extension is along the RIGHT margin approximately 3 o'clock. Shortest Distance of any tumor/node from Mesorectal fascia: 3 mm (image 15/series 10) Extramural Vascular Invasion/Tumor Thrombus: No Invasion of Anterior Peritoneal Reflection: No Involvement of Adjacent Organs or Pelvic Sidewall: No Levator Ani Involvement: No N - CATEGORY Mesorectal Lymph Nodes >=100mm: 6 mm lymph node in the LEFT mesorectum adjacent the tumor at 3 o'clock position (image 12/4 Extra-mesorectal Lymphadenopathy: No  Yes = N2 Other: None. IMPRESSION: 1. Exam limited by patient motion. 2. Rectal adenocarcinoma T stage: T3 B. 3. Rectal adenocarcinoma N stage: - N1. 4. Distance from tumor to the internal anal sphincter is 0 mm. Lesion in the distal RIGHT rectum extends to the internal anal sphincter. No clear invasion of the sphincter. Electronically Signed   By: Genevive Bi M.D.   On: 01/21/2023 12:33   CT CHEST W CONTRAST  Result Date: 01/20/2023 CLINICAL DATA:  Rectal cancer staging.  * Tracking Code: BO * EXAM: CT CHEST WITH CONTRAST TECHNIQUE: Multidetector CT imaging of the chest was performed during intravenous contrast administration. RADIATION DOSE REDUCTION: This exam was performed according to the departmental dose-optimization program which includes automated exposure control, adjustment of the mA and/or  kV according to patient size and/or use of iterative reconstruction technique. CONTRAST:  75mL OMNIPAQUE IOHEXOL 300 MG/ML  SOLN COMPARISON:  None Available. FINDINGS: Cardiovascular: Coronary artery calcification and aortic atherosclerotic calcification. Mediastinum/Nodes: No axillary or supraclavicular adenopathy. No mediastinal or hilar adenopathy. No pericardial fluid. Esophagus normal. Lungs/Pleura: Spiculated 15 mm nodule in the central RIGHT upper lobe (image 58/series 6). Bilateral pleural effusions with associated mild atelectasis at the lung bases. Upper Abdomen: Limited view of the liver, kidneys, pancreas are unremarkable. Normal adrenal glands. Musculoskeletal: No aggressive osseous lesion. IMPRESSION: 1. Spiculated nodule  in the RIGHT upper lobe in patient with reported rectal carcinoma is concerning for metastatic lesion. 2. Coronary artery calcification and aortic atherosclerotic calcification. Electronically Signed   By: Genevive Bi M.D.   On: 01/20/2023 19:00        Scheduled Meds:  atorvastatin  20 mg Oral Daily   feeding supplement  1 Container Oral TID BM   insulin aspart  0-9 Units Subcutaneous Q4H   levothyroxine  125 mcg Oral Q0600   nystatin  5 mL Oral QID   pantoprazole  40 mg Oral Daily   polyethylene glycol  17 g Oral Daily   Continuous Infusions:   LOS: 3 days    Time spent: 50 minutes spent on chart review, discussion with nursing staff, consultants, updating family and interview/physical exam; more than 50% of that time was spent in counseling and/or coordination of care.    Alvira Philips [place], DO [facility] Available via Epic secure chat 7am-7pm After these hours, please refer to coverage provider listed on [email or web address] 01/21/2023, 1:06 PM

## 2023-01-21 NOTE — Progress Notes (Signed)
  Echocardiogram 2D Echocardiogram has been performed.  [name] 01/21/2023, 11:26 AM

## 2023-01-21 NOTE — Progress Notes (Signed)
Rounding Note    Patient Name: [name] Date of Encounter: 01/21/2023  [facility] Cardiologist: Raenette Sakata   Subjective   89 yo with PAF and post conversions pauses  Echo to be done today    Inpatient Medications    Scheduled Meds:  atorvastatin  20 mg Oral Daily   feeding supplement  1 Container Oral TID BM   insulin aspart  0-9 Units Subcutaneous Q4H   levothyroxine  125 mcg Oral Q0600   nystatin  5 mL Oral QID   pantoprazole  40 mg Oral Daily   polyethylene glycol  17 g Oral Daily   Continuous Infusions:  PRN Meds: acetaminophen **OR** acetaminophen, albuterol, amisulpride, guaiFENesin-dextromethorphan, HYDROcodone-acetaminophen, LORazepam, methocarbamol, morphine injection, ondansetron **OR** ondansetron (ZOFRAN) IV, ondansetron (ZOFRAN) IV, sodium phosphate   Vital Signs    Vitals:   01/20/23 2004 01/20/23 2200 01/21/23 0000 01/21/23 0419  BP: (!) 157/71   (!) 135/53  Pulse: (!) 102   82  Resp: 20 18 17 20   Temp: 98.9 F (37.2 C)   99.1 F (37.3 C)  TempSrc: Oral   Oral  SpO2: 93% 95%  99%  Weight:      Height:        Intake/Output Summary (Last 24 hours) at 01/21/2023 0806 Last data filed at 01/21/2023 0000 Gross per 24 hour  Intake 358 ml  Output --  Net 358 ml      01/17/2023    7:14 PM 01/15/2023   11:26 AM 11/04/2022    7:06 PM  Last 3 Weights  Weight (lbs) 147 lb 147 lb 152 lb  Weight (kg) 66.679 kg 66.679 kg 68.947 kg      Telemetry    SR with episodes of PAF .  Post conversion pauses - Personally Reviewed  ECG     - Personally Reviewed  Physical Exam   GEN:  elderly female,  No acute distress.   Neck: No JVD Cardiac: RRR, soft systolic murmur , no rubs, or gallops.  Respiratory: Clear to auscultation bilaterally. GI: Soft, nontender, non-distended  MS: No edema; No deformity. Neuro:  Nonfocal  Psych: Normal affect   Labs    High Sensitivity Troponin:  No results for input(s): "TROPONINIHS" in the last 720 hours.    Chemistry Recent Labs  Lab 01/17/23 1940 01/17/23 1948 01/18/23 0532 01/18/23 1732 01/19/23 0540 01/20/23 0808 01/21/23 0453  NA 134*  --  136  --  139 134* 136  K 2.5*  --  2.9*   < > 3.3* 3.1* 3.5  CL 96*  --  103  --  104 100 101  CO2 28  --  28  --  29 28 28   GLUCOSE 203*  --  123*  --  109* 109* 116*  BUN 12  --  9  --  5* 8 7*  CREATININE 0.69  --  0.62  --  0.63 0.62 0.59  CALCIUM 8.7*  --  8.0*  --  8.2* 8.0* 8.3*  MG  --  1.6* 1.6*  --   --   --   --   PROT 6.7  --  5.6*  --   --   --   --   ALBUMIN 3.5  --  3.0*  --   --   --   --   AST 18  --  14*  --   --   --   --   ALT 19  --  16  --   --   --   --  ALKPHOS 69  --  50  --   --   --   --   BILITOT 0.7  --  0.8  --   --   --   --   GFRNONAA >60  --  >60  --  >60 >60 >60  ANIONGAP 10  --  5  --  6 6 7    < > = values in this interval not displayed.    Lipids No results for input(s): "CHOL", "TRIG", "HDL", "LABVLDL", "LDLCALC", "CHOLHDL" in the last 168 hours.  Hematology Recent Labs  Lab 01/19/23 0540 01/20/23 0808 01/21/23 0453  WBC 6.8 5.4 5.3  RBC 3.91 3.59* 3.53*  HGB 10.4* 9.6* 9.3*  HCT 33.0* 31.3* 29.8*  MCV 84.4 87.2 84.4  MCH 26.6 26.7 26.3  MCHC 31.5 30.7 31.2  RDW 14.2 14.3 14.2  PLT 251 219 216   Thyroid  Recent Labs  Lab 01/17/23 2230  TSH 0.831    BNPNo results for input(s): "BNP", "PROBNP" in the last 168 hours.  DDimer No results for input(s): "DDIMER" in the last 168 hours.   Radiology    CT CHEST W CONTRAST  Result Date: 01/20/2023 CLINICAL DATA:  Rectal cancer staging.  * Tracking Code: BO * EXAM: CT CHEST WITH CONTRAST TECHNIQUE: Multidetector CT imaging of the chest was performed during intravenous contrast administration. RADIATION DOSE REDUCTION: This exam was performed according to the departmental dose-optimization program which includes automated exposure control, adjustment of the mA and/or kV according to patient size and/or use of iterative reconstruction technique.  CONTRAST:  75mL OMNIPAQUE IOHEXOL 300 MG/ML  SOLN COMPARISON:  None Available. FINDINGS: Cardiovascular: Coronary artery calcification and aortic atherosclerotic calcification. Mediastinum/Nodes: No axillary or supraclavicular adenopathy. No mediastinal or hilar adenopathy. No pericardial fluid. Esophagus normal. Lungs/Pleura: Spiculated 15 mm nodule in the central RIGHT upper lobe (image 58/series 6). Bilateral pleural effusions with associated mild atelectasis at the lung bases. Upper Abdomen: Limited view of the liver, kidneys, pancreas are unremarkable. Normal adrenal glands. Musculoskeletal: No aggressive osseous lesion. IMPRESSION: 1. Spiculated nodule in the RIGHT upper lobe in patient with reported rectal carcinoma is concerning for metastatic lesion. 2. Coronary artery calcification and aortic atherosclerotic calcification. Electronically Signed   By: Genevive Bi M.D.   On: 01/20/2023 19:00    Cardiac Studies     Patient Profile     89 y.o. female   Assessment & Plan      Paroxysmal atrial fibrillation with post conversion pauses:   She continues to have pauses, not as long as the previous day.  Asymptomatic . Echo to be done today   2.  Colon cancer:   MRI of the abdomen today to assess her cancer .        For questions or updates, please contact [facility] Please consult [email or web address] for contact info under        Signed, Kristeen Miss, MD  01/21/2023, 8:06 AM

## 2023-01-21 NOTE — Progress Notes (Signed)
OT Cancellation Note  Patient Details Name: [name] MRN: [id number] DOB: [birth year]   Cancelled Treatment:    Reason Eval/Treat Not Completed: Patient at procedure or test/ unavailable Patient is off the hall at MRI at this time. OT to continue to follow and check back as schedule will allow  Rosalio Loud, MS Acute Rehabilitation Department Office# [phone]  01/21/2023, 9:31 AM

## 2023-01-22 ENCOUNTER — Encounter (HOSPITAL_COMMUNITY): Payer: Self-pay | Admitting: Internal Medicine

## 2023-01-22 DIAGNOSIS — I495 Sick sinus syndrome: Secondary | ICD-10-CM | POA: Diagnosis not present

## 2023-01-22 DIAGNOSIS — E876 Hypokalemia: Secondary | ICD-10-CM | POA: Diagnosis not present

## 2023-01-22 DIAGNOSIS — I480 Paroxysmal atrial fibrillation: Secondary | ICD-10-CM | POA: Diagnosis not present

## 2023-01-22 DIAGNOSIS — C20 Malignant neoplasm of rectum: Secondary | ICD-10-CM | POA: Diagnosis not present

## 2023-01-22 LAB — CBC
HCT: 31.3 % — ABNORMAL LOW (ref 36.0–46.0)
Hemoglobin: 9.7 g/dL — ABNORMAL LOW (ref 12.0–15.0)
MCH: 26.1 pg (ref 26.0–34.0)
MCHC: 31 g/dL (ref 30.0–36.0)
MCV: 84.4 fL (ref 80.0–100.0)
Platelets: 220 10*3/uL (ref 150–400)
RBC: 3.71 MIL/uL — ABNORMAL LOW (ref 3.87–5.11)
RDW: 14.2 % (ref 11.5–15.5)
WBC: 4.7 10*3/uL (ref 4.0–10.5)
nRBC: 0 % (ref 0.0–0.2)

## 2023-01-22 LAB — GLUCOSE, CAPILLARY
Glucose-Capillary: 95 mg/dL (ref 70–99)
Glucose-Capillary: 100 mg/dL — ABNORMAL HIGH (ref 70–99)
Glucose-Capillary: 163 mg/dL — ABNORMAL HIGH (ref 70–99)
Glucose-Capillary: 169 mg/dL — ABNORMAL HIGH (ref 70–99)
Glucose-Capillary: 152 mg/dL — ABNORMAL HIGH (ref 70–99)

## 2023-01-22 LAB — BASIC METABOLIC PANEL
Anion gap: 8 (ref 5–15)
BUN: 10 mg/dL (ref 8–23)
CO2: 29 mmol/L (ref 22–32)
Calcium: 8.3 mg/dL — ABNORMAL LOW (ref 8.9–10.3)
Chloride: 99 mmol/L (ref 98–111)
Creatinine, Ser: 0.55 mg/dL (ref 0.44–1.00)
GFR, Estimated: >60 mL/min (ref >60)
Glucose, Bld: 104 mg/dL — ABNORMAL HIGH (ref 70–99)
Potassium: 3.2 mmol/L — ABNORMAL LOW (ref 3.5–5.1)
Sodium: 136 mmol/L (ref 135–145)

## 2023-01-22 LAB — MAGNESIUM: Magnesium: 1.8 mg/dL (ref 1.7–2.4)

## 2023-01-22 MED ORDER — MAGNESIUM SULFATE 2 GM/50ML IV SOLN
2.0000 g | Freq: Once | INTRAVENOUS | Status: DC
  Filled 2023-01-22: qty 50

## 2023-01-22 MED ORDER — ENSURE ENLIVE PO LIQD
237.0000 mL | Freq: Three times a day (TID) | ORAL | Status: DC
  Administered 2023-01-22 – 2023-01-26 (×10): 237 mL via ORAL

## 2023-01-22 MED ORDER — AMIODARONE HCL 200 MG PO TABS
400.0000 mg | ORAL_TABLET | Freq: Every day | ORAL | Status: DC
  Administered 2023-01-22 – 2023-01-28 (×7): 400 mg via ORAL
  Filled 2023-01-22 (×8): qty 2

## 2023-01-22 MED ORDER — POTASSIUM CHLORIDE CRYS ER 20 MEQ PO TBCR
40.0000 meq | EXTENDED_RELEASE_TABLET | ORAL | Status: AC
  Administered 2023-01-22 (×2): 40 meq via ORAL
  Filled 2023-01-22 (×2): qty 2

## 2023-01-22 NOTE — Progress Notes (Addendum)
Rounding Note    Patient Name: [name] Date of Encounter: 01/22/2023  [facility] Cardiologist: None   Subjective   Patient is in NSR. She is sad given recent colon cancer news with metastatic lung nodule.  Inpatient Medications    Scheduled Meds:  atorvastatin  20 mg Oral Daily   vitamin B-12  1,000 mcg Oral Daily   feeding supplement  1 Container Oral TID BM   feeding supplement  237 mL Oral TID WC   insulin aspart  0-9 Units Subcutaneous Q4H   levothyroxine  125 mcg Oral Q0600   nystatin  5 mL Oral QID   pantoprazole  40 mg Oral Daily   polyethylene glycol  17 g Oral Daily   potassium chloride  40 mEq Oral Q3H   Continuous Infusions:  PRN Meds: acetaminophen **OR** acetaminophen, albuterol, amisulpride, guaiFENesin-dextromethorphan, HYDROcodone-acetaminophen, LORazepam, methocarbamol, morphine injection, ondansetron **OR** ondansetron (ZOFRAN) IV, ondansetron (ZOFRAN) IV, sodium phosphate   Vital Signs    Vitals:   01/21/23 0419 01/21/23 1421 01/21/23 2009 01/22/23 0418  BP: (!) 135/53 (!) 145/62 (!) 142/94 138/64  Pulse: 82 92 79 77  Resp: 20 18 20 18   Temp: 99.1 F (37.3 C) 97.7 F (36.5 C) 98.1 F (36.7 C) 97.8 F (36.6 C)  TempSrc: Oral Oral Oral Oral  SpO2: 99% 98% 97% 96%  Weight:      Height:        Intake/Output Summary (Last 24 hours) at 01/22/2023 1142 Last data filed at 01/22/2023 0945 Gross per 24 hour  Intake 518 ml  Output --  Net 518 ml      01/17/2023    7:14 PM 01/15/2023   11:26 AM 11/04/2022    7:06 PM  Last 3 Weights  Weight (lbs) 147 lb 147 lb 152 lb  Weight (kg) 66.679 kg 66.679 kg 68.947 kg      Telemetry    NSR HR 70-80s, pAFib up to 120s with post-conversion pauses 5 seconds and 5.86 seconds.  - Personally Reviewed  ECG    No new - Personally Reviewed  Physical Exam   GEN: No acute distress.   Neck: No JVD Cardiac: RRR, no murmurs, rubs, or gallops.  Respiratory: Clear to auscultation  bilaterally. GI: Soft, nontender, non-distended  MS: No edema; No deformity. Neuro:  Nonfocal  Psych: Normal affect   Labs    High Sensitivity Troponin:  No results for input(s): "TROPONINIHS" in the last 720 hours.   Chemistry Recent Labs  Lab 01/17/23 1940 01/17/23 1948 01/18/23 0532 01/18/23 1732 01/20/23 0808 01/21/23 0453 01/22/23 0438  NA 134*  --  136   < > 134* 136 136  K 2.5*  --  2.9*   < > 3.1* 3.5 3.2*  CL 96*  --  103   < > 100 101 99  CO2 28  --  28   < > 28 28 29   GLUCOSE 203*  --  123*   < > 109* 116* 104*  BUN 12  --  9   < > 8 7* 10  CREATININE 0.69  --  0.62   < > 0.62 0.59 0.55  CALCIUM 8.7*  --  8.0*   < > 8.0* 8.3* 8.3*  MG  --  1.6* 1.6*  --   --   --  1.8  PROT 6.7  --  5.6*  --   --   --   --   ALBUMIN 3.5  --  3.0*  --   --   --   --   AST 18  --  14*  --   --   --   --   ALT 19  --  16  --   --   --   --   ALKPHOS 69  --  50  --   --   --   --   BILITOT 0.7  --  0.8  --   --   --   --   GFRNONAA >60  --  >60   < > >60 >60 >60  ANIONGAP 10  --  5   < > 6 7 8    < > = values in this interval not displayed.    Lipids No results for input(s): "CHOL", "TRIG", "HDL", "LABVLDL", "LDLCALC", "CHOLHDL" in the last 168 hours.  Hematology Recent Labs  Lab 01/20/23 0808 01/21/23 0453 01/22/23 0438  WBC 5.4 5.3 4.7  RBC 3.59* 3.53* 3.71*  HGB 9.6* 9.3* 9.7*  HCT 31.3* 29.8* 31.3*  MCV 87.2 84.4 84.4  MCH 26.7 26.3 26.1  MCHC 30.7 31.2 31.0  RDW 14.3 14.2 14.2  PLT 219 216 220   Thyroid  Recent Labs  Lab 01/17/23 2230  TSH 0.831    BNPNo results for input(s): "BNP", "PROBNP" in the last 168 hours.  DDimer No results for input(s): "DDIMER" in the last 168 hours.   Radiology    ECHOCARDIOGRAM COMPLETE  Result Date: 01/21/2023    ECHOCARDIOGRAM REPORT   Patient Name:   [name] Date of Exam: 01/21/2023 Medical Rec #:  [id number]     Height: Accession #:    [id number]    Weight: Date of Birth:  1928/12/30    BSA: Patient Age:    89 years       BP:           135/53 mmHg Patient Gender: F             HR:           96 bpm. Exam Location:  Inpatient Procedure: 2D Echo, Cardiac Doppler and Color Doppler Indications:    Atrial Fibrillation  History:        Patient has prior history of Echocardiogram examinations, most                 recent 07/10/2017. Stroke, Arrythmias:Atrial Fibrillation; Risk                 Factors:HLD.  Sonographer:    Lucy Antigua Referring Phys: [phone] PHILIP J NAHSER IMPRESSIONS  1. Left ventricular ejection fraction, by estimation, is 60 to 65%. The left ventricle has normal function. The left ventricle has no regional wall motion abnormalities. Left ventricular diastolic parameters are indeterminate.  2. Right ventricular systolic function is normal. The right ventricular size is normal. There is normal pulmonary artery systolic pressure.  3. The mitral valve is grossly normal. Mild mitral valve regurgitation.  4. The aortic valve is calcified. Aortic valve regurgitation is not visualized. Mild aortic valve stenosis.  5. The inferior vena cava is normal in size with greater than 50% respiratory variability, suggesting right atrial pressure of 3 mmHg. FINDINGS  Left Ventricle: Left ventricular ejection fraction, by estimation, is 60 to 65%. The left ventricle has normal function. The left ventricle has no regional wall motion abnormalities. The left ventricular internal cavity size was normal in size. There is  no left ventricular hypertrophy. Left ventricular diastolic parameters  are indeterminate. Right Ventricle: The right ventricular size is normal. Right ventricular systolic function is normal. There is normal pulmonary artery systolic pressure. The tricuspid regurgitant velocity is 2.67 m/s, and with an assumed right atrial pressure of 3 mmHg,  the estimated right ventricular systolic pressure is 31.5 mmHg. Left Atrium: Left atrial size was normal in size. Right Atrium: Right atrial size was normal in size. Pericardium: There  is no evidence of pericardial effusion. Mitral Valve: The mitral valve is grossly normal. Mild mitral annular calcification. Mild mitral valve regurgitation. Tricuspid Valve: Tricuspid valve regurgitation is mild. Aortic Valve: The aortic valve is calcified. Aortic valve regurgitation is not visualized. Mild aortic stenosis is present. Aortic valve mean gradient measures 10.0 mmHg. Aortic valve peak gradient measures 19.7 mmHg. Aortic valve area, by VTI measures 0.91 cm. Pulmonic Valve: Pulmonic valve regurgitation is not visualized. Aorta: The aortic root and ascending aorta are structurally normal, with no evidence of dilitation. Venous: The inferior vena cava is normal in size with greater than 50% respiratory variability, suggesting right atrial pressure of 3 mmHg. IAS/Shunts: No atrial level shunt detected by color flow Doppler.  LEFT VENTRICLE PLAX 2D LVIDd:         3.90 cm LVIDs:         2.60 cm LV PW:         0.90 cm LV IVS:        0.70 cm LVOT diam:     1.60 cm LV SV:         39 LVOT Area:     2.01 cm  LV Volumes (MOD) LV vol d, MOD A4C: 47.2 ml LV vol s, MOD A4C: 12.6 ml LV SV MOD A4C:     47.2 ml RIGHT VENTRICLE RV S prime:     15.80 cm/s TAPSE (M-mode): 1.7 cm LEFT ATRIUM             RIGHT ATRIUM LA Vol (A2C):   42.0 ml RA Area:     9.91 cm LA Vol (A4C):   33.9 ml RA Volume:   19.60 ml LA Biplane Vol: 41.6 ml  AORTIC VALVE AV Area (Vmax):    0.76 cm AV Area (Vmean):   0.83 cm AV Area (VTI):     0.91 cm AV Vmax:           222.00 cm/s AV Vmean:          146.000 cm/s AV VTI:            0.434 m AV Peak Grad:      19.7 mmHg AV Mean Grad:      10.0 mmHg LVOT Vmax:         84.30 cm/s LVOT Vmean:        60.100 cm/s LVOT VTI:          0.196 m LVOT/AV VTI ratio: 0.45  AORTA Ao Root diam: 3.10 cm Ao Asc diam:  2.60 cm MITRAL VALVE                TRICUSPID VALVE MV Area (PHT): 3.46 cm     TR Peak grad:   28.5 mmHg MV Decel Time: 219 msec     TR Vmax:        267.00 cm/s MR Peak grad: 141.6 mmHg MR Vmax:       595.00 cm/s   SHUNTS MV E velocity: 133.00 cm/s  Systemic VTI:  0.20 m MV A velocity: 79.80 cm/s   Systemic Diam: 1.60  cm MV E/A ratio:  1.67 Carolan Clines Electronically signed by Carolan Clines Signature Date/Time: 01/21/2023/1:40:22 PM    Final    MR PELVIS WO CM RECTAL CA STAGING  Result Date: 01/21/2023 CLINICAL DATA:  Potential rectal carcinoma. No rectal mass on colonoscopy. EXAM: MRI PELVIS WITHOUT CONTRAST TECHNIQUE: Multiplanar multisequence MR imaging of the pelvis was performed. No intravenous contrast was administered. Ultrasound gel was administered per rectum to optimize tumor evaluation. COMPARISON:  CT 01/17/2023 FINDINGS: Patient was moving during exam. Best images obtained with this limitation TUMOR LOCATION Large polypoid tumor in the distal rectum along the RIGHT wall. Rectal mass mass extends from 12:00 to 6:00 (image 4/12). The tumor extends essentially to the internal anal sphincter. Tumor distance from Anal Verge/Skin surface: 3.5 cm Tumor distance to Internal Anal sphincter: 0mm TUMOR DESCRIPTION Circumferential extent: 180 degrees along the RIGHT margin from 12:00 to 6:00 (image 14/series 4 Tumor Size and volume: 4 cm in length T - CATEGORY Extension through Muscularis Propria: T3 B. Tumor extends through the muscularis propria approximately 5 mm (image 16/10). This extension is along the RIGHT margin approximately 3 o'clock. Shortest Distance of any tumor/node from Mesorectal fascia: 3 mm (image 15/series 10) Extramural Vascular Invasion/Tumor Thrombus: No Invasion of Anterior Peritoneal Reflection: No Involvement of Adjacent Organs or Pelvic Sidewall: No Levator Ani Involvement: No N - CATEGORY Mesorectal Lymph Nodes >=28mm: 6 mm lymph node in the LEFT mesorectum adjacent the tumor at 3 o'clock position (image 12/4 Extra-mesorectal Lymphadenopathy: No  Yes = N2 Other: None. IMPRESSION: 1. Exam limited by patient motion. 2. Rectal adenocarcinoma T stage: T3 B. 3. Rectal adenocarcinoma N  stage: - N1. 4. Distance from tumor to the internal anal sphincter is 0 mm. Lesion in the distal RIGHT rectum extends to the internal anal sphincter. No clear invasion of the sphincter. Electronically Signed   By: Genevive Bi M.D.   On: 01/21/2023 12:33    Cardiac Studies   Echo 01/21/23  1. Left ventricular ejection fraction, by estimation, is 60 to 65%. The  left ventricle has normal function. The left ventricle has no regional  wall motion abnormalities. Left ventricular diastolic parameters are  indeterminate.   2. Right ventricular systolic function is normal. The right ventricular  size is normal. There is normal pulmonary artery systolic pressure.   3. The mitral valve is grossly normal. Mild mitral valve regurgitation.   4. The aortic valve is calcified. Aortic valve regurgitation is not  visualized. Mild aortic valve stenosis.   5. The inferior vena cava is normal in size with greater than 50%  respiratory variability, suggesting right atrial pressure of 3 mmHg.   Patient Profile     89 y.o. female with a h/o anemia, colon cancer who is being seen for prolonged sinus pause.  Assessment & Plan    New onset Paroxysmal Afib - mostly in NSR HR 70-80s - pAFIB with post-conversion pauses up to 10 seconds, most recent pauses 5-6 seconds - echo showed normal LVEF, mild MR, mild AS - CHADSVASC at least 5, since there is no plan for surgery, can start Eliquis 5mg  BID. Hgb 9-10, will have to follow - K 3.2, goal>4 - keep Mag>2 - THS normal - avoid BB given post-conversion pause. May need AA to stay in NSR. 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  I have reviewed the chart, notes and new data.  I agree with PA/NP's note.  Key new complaints: denies syncope or presyncope, occasionally aware of rapid palpitations. Key examination changes: normal CV exam x barely audible aortic ejection murmur Key new findings / data: reviewed telemetry which shows multiple episodes of paroxysmal atrial fibrillation with RVR followed by post conversion pauses of 5-10". 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 Awaiting Oncology follow up. A pacemaker may be best avoided, unless we find that her prognosis is better than currently anticipated. Will try amiodarone to suppress atrial fibrillation and consequently prevent the post conversion pauses. Will monitor on telemetry. Eliquis once there is no plan for invasive procedures. Avoid diltiazem and beta blockers.  Thurmon Fair, MD, [facility] [phone] 01/22/2023, 3:21 PM

## 2023-01-22 NOTE — Progress Notes (Addendum)
PT Cancellation Note  Patient Details Name: [name] MRN: [id number] DOB: 24-Oct-1928   Cancelled Treatment:    Reason Eval/Treat Not Completed: Other (comment)  Am: RN reports pt was just told about metastatic CA and not ready for therapy at this time.. Will f/u as able.   PM: checked and pt reports not up for therapy today.  Will f/u later date as able.   Anise Salvo, PT Acute Rehab [facility] [phone]  Rayetta Humphrey 01/22/2023, 11:58 AM

## 2023-01-22 NOTE — Progress Notes (Signed)
Chaplain engaged in an initial visit with [name], her dear friend, and future daughter in Social worker. Chaplain offered reflective listening, support, normalization of emotions, and prayer with [name] and her family at bedside.   [name] shared that she does not desire to die and that she does want to leave her son alone. She has been a source of help and security for her son as he goes through his own health challenges and battles. [name] is vehemently against dying right now. Chaplain and friends highlighted that she hasn't spoken with the oncologist yet and see what the physician may say, while also uplifting what is no longer possible because of the depth of her cancer and due to age. Chaplain and friends worked to Visual merchandiser anxiety while also recognizing that she deserves to cry, be angry, and more because of the news she just received about her body and health.   Chaplain was a compassionate presence that honored [name]'s emotions while encouraging her.   Chaplain Devonta Blanford, MDiv  01/22/23 1100  Spiritual Encounters  Type of Visit Initial  Care provided to: Pt and family  Reason for visit Grief/loss  Spiritual Framework  Presenting Themes Community and relationships;Rituals and practive;Impactful experiences and emotions;Significant life change  Interventions  Spiritual Care Interventions Made Prayer;Encouragement;Supported grief process;Normalization of emotions;Reflective listening;Compassionate presence;Established relationship of care and support

## 2023-01-22 NOTE — Consult Note (Signed)
Referral MD  Reason for Referral: Locally advanced adenocarcinoma of the rectum; solitary pulmonary nodule right lung  Chief Complaint  Patient presents with   Abdominal Pain  : They found cancer.  HPI: Ms. Senft is a very charming 89 year old white female.  She does have multiple medical problems.  She has a past history of "leukemia".  She was treated by Dr. Cyndie Chime.  She thinks this was probably about 35 years ago.  She says she had a colonoscopy back in 2018.  She subsequently has noticed some intermittent bleeding from the rectum.  She was admitted on 01/17/2023.  Her white count is 5.5.  Hemoglobin 9.4.  Platelet count 267,000.  The next day her white cell count is 4.9.  Hemoglobin 9.5.  Platelet count 236,000.  She was found to have marked iron deficiency anemia.  Her ferritin was 5 with an iron saturation of 7%.  She was seen by gastroenterology.  She did have a colonoscopy done.  This was done on 01/19/2023.  This showed a firm mass in the mid to distal rectum.  This could be palpated by digital exam.  It measured about 8 cm.  Biopsies were taken.  The pathology report [id number]) showed a moderately differentiated adenocarcinoma.  She had a rectal MRI.  This confirmed a mass.  There was some positive lymph nodes on MRI.  This was felt to be a T2bN1 lesion.  A CT scan was done.  There was a solitary nodule in the right middle lung.  I do this measuring 1.5 cm.  She was subsequently seen by surgery.  Because of this solitary nodule, they did not think that there was a role for surgery.  Oncology was subsequently consulted.  She has been pretty active.  She was at home.  She was doing housework.  She smoked.  She stopped probably about 35 years ago.  She probably had about a 40-pack-year history of tobacco use.  Her son has metastatic colorectal cancer.  He has been treated by Dr. Mancel Bale.  She does have occasional blood per rectum.  There is no rectal  pain.  Her appetite is doing okay.  She is not have any constipation.  There is no cough there is no chest wall pain.  Overall, I would say that her performance status is probably ECOG 1-2.  Past Medical History:  Diagnosis Date   Anemia    Anxiety    Colon polyps    DDD (degenerative disc disease), cervical    Diabetes mellitus    Diabetes mellitus, type 2 (HCC)    Hyperlipidemia    Hypertension    Hypothyroidism    Leukemia (HCC) in remission   Mucous retention cyst of maxillary sinus    Osteoporosis    Pinched nerve In neck   Plantar fasciitis    Stroke [facility])    Vitamin D deficiency   :   Past Surgical History:  Procedure Laterality Date   APPENDECTOMY     BIOPSY  01/19/2023   Procedure: BIOPSY;  Surgeon: Beverley Fiedler, MD;  Location: [facility] ENDOSCOPY;  Service: Gastroenterology;;   CHOLECYSTECTOMY     2004   COLONOSCOPY N/A 02/24/2013   Procedure: COLONOSCOPY;  Surgeon: Meryl Dare, MD;  Location: [facility] ENDOSCOPY;  Service: Endoscopy;  Laterality: N/A;   COLONOSCOPY W/ POLYPECTOMY     COLONOSCOPY WITH PROPOFOL N/A 12/03/2017   Procedure: COLONOSCOPY WITH PROPOFOL;  Surgeon: Rachael Fee, MD;  Location: [facility] ENDOSCOPY;  Service: Gastroenterology;  Laterality: N/A;   ESOPHAGOGASTRODUODENOSCOPY     ESOPHAGOGASTRODUODENOSCOPY N/A 02/24/2013   Procedure: ESOPHAGOGASTRODUODENOSCOPY (EGD);  Surgeon: Meryl Dare, MD;  Location: [facility] ENDOSCOPY;  Service: Endoscopy;  Laterality: N/A;   ESOPHAGOGASTRODUODENOSCOPY (EGD) WITH PROPOFOL N/A 12/03/2017   Procedure: ESOPHAGOGASTRODUODENOSCOPY (EGD) WITH PROPOFOL;  Surgeon: Rachael Fee, MD;  Location: [facility] ENDOSCOPY;  Service: Gastroenterology;  Laterality: N/A;   EYE SURGERY     bilateral catracts   FLEXIBLE SIGMOIDOSCOPY N/A 01/19/2023   Procedure: FLEXIBLE SIGMOIDOSCOPY;  Surgeon: Beverley Fiedler, MD;  Location: [facility] ENDOSCOPY;  Service: Gastroenterology;  Laterality: N/A;  :   Current Facility-Administered Medications:     acetaminophen (TYLENOL) tablet 650 mg, 650 mg, Oral, Q6H PRN, 650 mg at 01/19/23 1546 **OR** acetaminophen (TYLENOL) suppository 650 mg, 650 mg, Rectal, Q6H PRN, Doutova, Anastassia, MD   albuterol (PROVENTIL) (2.5 MG/3ML) 0.083% nebulizer solution 2.5 mg, 2.5 mg, Nebulization, Q2H PRN, Doutova, Anastassia, MD, 2.5 mg at 01/20/23 1610   amiodarone (PACERONE) tablet 400 mg, 400 mg, Oral, Daily, Croitoru, Mihai, MD, 400 mg at 01/22/23 1607   amisulpride (BARHEMSYS) injection 10 mg, 10 mg, Intravenous, Once PRN, Shelton Silvas, MD   atorvastatin (LIPITOR) tablet 20 mg, 20 mg, Oral, Daily, Doutova, Anastassia, MD, 20 mg at 01/22/23 0950   cyanocobalamin (VITAMIN B12) tablet 1,000 mcg, 1,000 mcg, Oral, Daily, [place], Alvira Philips, DO, 1,000 mcg at 01/22/23 0950   feeding supplement (BOOST / RESOURCE BREEZE) liquid 1 Container, 1 Container, Oral, TID BM, [place], Alvira Philips, DO, 1 Container at 01/22/23 0951   feeding supplement (ENSURE ENLIVE / ENSURE PLUS) liquid 237 mL, 237 mL, Oral, TID WC, [place], Elizabeth S, PA-C   guaiFENesin-dextromethorphan (ROBITUSSIN DM) 100-10 MG/5ML syrup 10 mL, 10 mL, Oral, Q4H PRN, Adhikari, Amrit, MD   HYDROcodone-acetaminophen (NORCO/VICODIN) 5-325 MG per tablet 1-2 tablet, 1-2 tablet, Oral, Q4H PRN, Doutova, Anastassia, MD, 2 tablet at 01/21/23 2120   insulin aspart (novoLOG) injection 0-9 Units, 0-9 Units, Subcutaneous, Q4H, Doutova, Anastassia, MD, 2 Units at 01/22/23 1153   levothyroxine (SYNTHROID) tablet 125 mcg, 125 mcg, Oral, Q0600, Doutova, Anastassia, MD, 125 mcg at 01/22/23 0655   LORazepam (ATIVAN) tablet 1 mg, 1 mg, Oral, Q4H PRN, Jonah Blue, MD, 1 mg at 01/22/23 1153   methocarbamol (ROBAXIN) tablet 500 mg, 500 mg, Oral, Q8H PRN, Adhikari, Amrit, MD, 500 mg at 01/22/23 1413   morphine (PF) 2 MG/ML injection 2 mg, 2 mg, Intravenous, Q3H PRN, [place], Alvira Philips, DO, 2 mg at 01/21/23 0802   nystatin (MYCOSTATIN) 100000 UNIT/ML suspension 500,000 Units, 5 mL, Oral, QID,  Jonah Blue, MD, 500,000 Units at 01/22/23 1408   ondansetron (ZOFRAN) tablet 4 mg, 4 mg, Oral, Q6H PRN, 4 mg at 01/22/23 0959 **OR** ondansetron (ZOFRAN) injection 4 mg, 4 mg, Intravenous, Q6H PRN, Doutova, Anastassia, MD   ondansetron (ZOFRAN) injection 4 mg, 4 mg, Intravenous, Once PRN, Shelton Silvas, MD   pantoprazole (PROTONIX) EC tablet 40 mg, 40 mg, Oral, Daily, Doutova, Anastassia, MD, 40 mg at 01/22/23 0950   polyethylene glycol (MIRALAX / GLYCOLAX) packet 17 g, 17 g, Oral, Daily, Willette Cluster M, NP, 17 g at 01/21/23 1046   sodium phosphate (FLEET) 7-19 GM/118ML enema 1 enema, 1 enema, Rectal, Daily PRN, Jonah Blue, MD, 1 enema at 01/21/23 0815:   amiodarone  400 mg Oral Daily   atorvastatin  20 mg Oral Daily   vitamin B-12  1,000 mcg Oral Daily   feeding supplement  1 Container Oral TID BM  feeding supplement  237 mL Oral TID WC   insulin aspart  0-9 Units Subcutaneous Q4H   levothyroxine  125 mcg Oral Q0600   nystatin  5 mL Oral QID   pantoprazole  40 mg Oral Daily   polyethylene glycol  17 g Oral Daily  :   Allergies  Allergen Reactions   Prednisone     Climbs the walls  :   Family History  Problem Relation Age of Onset   Diabetes Other    Colon cancer Son    Esophageal cancer Neg Hx    Stomach cancer Neg Hx   :   Social History   Socioeconomic History   Marital status: Widowed    Spouse name: Not on file   Number of children: 3   Years of education: Not on file   Highest education level: Not on file  Occupational History   Occupation: retired  Tobacco Use   Smoking status: Former   Smokeless tobacco: Never  Building services engineer Use: Former  Substance and Sexual Activity   Alcohol use: No   Drug use: No   Sexual activity: Not Currently  Other Topics Concern   Not on file  Social History Narrative   Not on file   Social Determinants of Health   Financial Resource Strain: Not on file  Food Insecurity: No Food Insecurity (01/18/2023)    Hunger Vital Sign    Worried About Running Out of Food in the Last Year: Never true    Ran Out of Food in the Last Year: Never true  Transportation Needs: No Transportation Needs (01/18/2023)   PRAPARE - Administrator, Civil Service (Medical): No    Lack of Transportation (Non-Medical): No  Physical Activity: Not on file  Stress: Not on file  Social Connections: Not on file  Intimate Partner Violence: Not At Risk (01/18/2023)   Humiliation, Afraid, Rape, and Kick questionnaire    Fear of Current or Ex-Partner: No    Emotionally Abused: No    Physically Abused: No    Sexually Abused: No  :  Review of Systems  Constitutional: Negative.   HENT: Negative.    Eyes: Negative.   Respiratory:  Positive for shortness of breath.   Cardiovascular:  Positive for palpitations.  Gastrointestinal:  Positive for blood in stool.  Genitourinary: Negative.   Musculoskeletal: Negative.   Skin: Negative.  Negative for itching.  Neurological: Negative.   Endo/Heme/Allergies: Negative.   Psychiatric/Behavioral: Negative.       Exam: Vital signs are temperature of 97.7.  Pulse 84.  Blood pressure 150/67.  Her weight was 147 pounds.  Patient Vitals for the past 24 hrs:  BP Temp Temp src Pulse Resp SpO2  01/22/23 1320 (!) 150/67 97.7 F (36.5 C) Oral 84 14 98 %  01/22/23 0418 138/64 97.8 F (36.6 C) Oral 77 18 96 %  01/21/23 2009 (!) 142/94 98.1 F (36.7 C) Oral 79 20 97 %   Physical Exam Vitals reviewed.  Constitutional:      Comments: Elderly but well-nourished white female in no obvious distress.  She is alert and oriented x 3.  She is quite perky.  HENT:     Head: Normocephalic and atraumatic.  Eyes:     Pupils: Pupils are equal, round, and reactive to light.  Cardiovascular:     Rate and Rhythm: Normal rate and regular rhythm.     Heart sounds: Normal heart sounds.     Comments:  Cardiac exam shows regular rate and rhythm with occasional extra beat.  She has no murmurs,  rubs or bruits. Pulmonary:     Effort: Pulmonary effort is normal.     Breath sounds: Normal breath sounds.  Abdominal:     General: Bowel sounds are normal.     Palpations: Abdomen is soft.     Comments: Abdominal exam shows a soft abdomen.  Bowel sounds are present.  There is no guarding or rebound tenderness.  There is no palpable hepatosplenomegaly.  Musculoskeletal:        General: No tenderness or deformity. Normal range of motion.     Cervical back: Normal range of motion.  Lymphadenopathy:     Cervical: No cervical adenopathy.  Skin:    General: Skin is warm and dry.     Findings: No erythema or rash.  Neurological:     Mental Status: She is alert and oriented to person, place, and time.  Psychiatric:        Behavior: Behavior normal.        Thought Content: Thought content normal.        Judgment: Judgment normal.      Recent Labs    01/21/23 0453 01/22/23 0438  WBC 5.3 4.7  HGB 9.3* 9.7*  HCT 29.8* 31.3*  PLT 216 220    Recent Labs    01/21/23 0453 01/22/23 0438  NA 136 136  K 3.5 3.2*  CL 101 99  CO2 28 29  GLUCOSE 116* 104*  BUN 7* 10  CREATININE 0.59 0.55  CALCIUM 8.3* 8.3*    Blood smear review: None  Pathology: See above    Assessment and Plan: Ms. Strick is a very nice 89 year old white female.  She has at least locally advanced rectal cancer.  I think the question really is whether or not there is a met to the lung.  She does have a history of tobacco use.  This is a solitary lesion.  I think her problem clearly is going to be the rectal cancer itself.  This is clearly where treatment needs to be focused.  She is 89 years old.  She does have concurrent health issues.  It is concerning to be very challenging to treat her.  I do think that she would be a candidate for systemic chemotherapy.  2 try to make life easier for her.  I probably would recommend Xeloda/oxaliplatin.  I think Xeloda/oxaliplatin would be reasonable regimen for her.   There would have to be some dosage reduction.  She will need to have a Port-A-Cath placed in my opinion.  I do still see that she has great peripheral access.  As far as the lung nodule is concerned, I do not know if this could be biopsied.  I probably would get Pulmonary Medicine involved to see if they can do a bronchoscopy on her and see if this could be biopsied.  I will send off a CEA level on her.  We clearly need to get molecular markers on the tumor.  Given that her son has colon cancer, she may have a familial form of colon cancer.  I had a long talk with she and I think a family member.  I explained the situation.  I failed to mention that we probably need to get Radiation Oncology involved to see if she would be a candidate for radiosurgery for this nodule.  This is incredibly complicated.  This will take several days to figure out what  might be the best route of therapy for her.  I would still favor treating the rectal lesion aggressively.  She might need upfront chemotherapy and then chemoradiation therapy.  If, for some miracle, she does have a high MSI or deficient MMR, then we could use immunotherapy alone for our treatment.  She would like to be seen by Dr. Truett Perna.  He is out of town this week.  I am sure that he will be more than happy to be her oncologist when he returns.   [name], MD  Fayrene Fearing 1:5

## 2023-01-22 NOTE — Progress Notes (Addendum)
Patient ID: [name], female   DOB: [name] 05, 1930, 89 y.o.   MRN: [id number]     Brief GI note;   Path from rectal biopsy has returned showing a moderately differentiated adenocarcinoma.  MRI of pelvis-large polypoid tumor in the distal rectum along the right wall, tumor extends essentially to the internal anal sphincter, Tumor 4 cm in length One 6 mm lymph node in the left mesorectum Rectal adenocarcinoma T3b N1  Chest CT-1 spiculated nodule in the right upper lobe-15 mm  Surgery and oncology are following. Expect plan will be for neoadjuvant therapy GI will be available if needed  GI ATTENDING  As above.  Will sign off.  Wilhemina Bonito. Eda Keys., M.D. [facility] Division of Gastroenterology

## 2023-01-22 NOTE — Progress Notes (Signed)
OT Cancellation Note  Patient Details Name: [name] MRN: [id number] DOB: December 01, 1928   Cancelled Treatment:    Reason Eval/Treat Not Completed: Other (comment) Per PT, patient is in process of receiving news about current medical status (CA). OT to continue to follow and check back as schedule will allow.  Rosalio Loud, MS Acute Rehabilitation Department Office# [phone]  01/22/2023, 2:07 PM

## 2023-01-22 NOTE — Progress Notes (Signed)
[facility] Progress Note  3 Days Post-Op  Subjective: CC:  Cc R hand burning from IV Mg. Denies abd pain. Denies nausea/vomiting. Reports she is passing gas and having loose, non-bloody stools. Requests that I update her son once we have more info.   Objective: Vital signs in last 24 hours: Temp:  [97.7 F (36.5 C)-98.1 F (36.7 C)] 97.8 F (36.6 C) (05/13 0418) Pulse Rate:  [77-92] 77 (05/13 0418) Resp:  [18-20] 18 (05/13 0418) BP: (138-145)/(62-94) 138/64 (05/13 0418) SpO2:  [96 %-98 %] 96 % (05/13 0418) Last BM Date : 01/22/23  Intake/Output from previous day: No intake/output data recorded. Intake/Output this shift: No intake/output data recorded.  PE: Gen:  Alert, NAD, pleasant Card:  Regular rate and rhythm Pulm:  Normal effort on 1L [place] Abd: Soft, mild distention, nontender, no hernias, no palpable HSM Skin: warm and dry, no rashes  Psych: A&Ox3   Lab Results:  Recent Labs    01/21/23 0453 01/22/23 0438  WBC 5.3 4.7  HGB 9.3* 9.7*  HCT 29.8* 31.3*  PLT 216 220   BMET Recent Labs    01/21/23 0453 01/22/23 0438  NA 136 136  K 3.5 3.2*  CL 101 99  CO2 28 29  GLUCOSE 116* 104*  BUN 7* 10  CREATININE 0.59 0.55  CALCIUM 8.3* 8.3*   PT/INR No results for input(s): "LABPROT", "INR" in the last 72 hours. CMP     Component Value Date/Time   NA 136 01/22/2023 0438   K 3.2 (L) 01/22/2023 0438   CL 99 01/22/2023 0438   CO2 29 01/22/2023 0438   GLUCOSE 104 (H) 01/22/2023 0438   BUN 10 01/22/2023 0438   CREATININE 0.55 01/22/2023 0438   CALCIUM 8.3 (L) 01/22/2023 0438   PROT 5.6 (L) 01/18/2023 0532   ALBUMIN 3.0 (L) 01/18/2023 0532   AST 14 (L) 01/18/2023 0532   ALT 16 01/18/2023 0532   ALKPHOS 50 01/18/2023 0532   BILITOT 0.8 01/18/2023 0532   GFRNONAA >60 01/22/2023 0438   GFRAA >60 12/05/2017 0334   Lipase     Component Value Date/Time   LIPASE 34 01/17/2023 1940       Studies/Results: ECHOCARDIOGRAM COMPLETE  Result  Date: 01/21/2023    ECHOCARDIOGRAM REPORT   Patient Name:   [name] Date of Exam: 01/21/2023 Medical Rec #:  [id number]     Height: Accession #:    [id number]    Weight: Date of Birth:  [birth year]    BSA: Patient Age:    89 years      BP:           135/53 mmHg Patient Gender: F             HR:           96 bpm. Exam Location:  Inpatient Procedure: 2D Echo, Cardiac Doppler and Color Doppler Indications:    Atrial Fibrillation  History:        Patient has prior history of Echocardiogram examinations, most                 recent 07/10/2017. Stroke, Arrythmias:Atrial Fibrillation; Risk                 Factors:HLD.  Sonographer:    Lucy Antigua Referring Phys: [phone] PHILIP J NAHSER IMPRESSIONS  1. Left ventricular ejection fraction, by estimation, is 60 to 65%. The left ventricle has normal function. The left ventricle has no regional wall motion abnormalities.  Left ventricular diastolic parameters are indeterminate.  2. Right ventricular systolic function is normal. The right ventricular size is normal. There is normal pulmonary artery systolic pressure.  3. The mitral valve is grossly normal. Mild mitral valve regurgitation.  4. The aortic valve is calcified. Aortic valve regurgitation is not visualized. Mild aortic valve stenosis.  5. The inferior vena cava is normal in size with greater than 50% respiratory variability, suggesting right atrial pressure of 3 mmHg. FINDINGS  Left Ventricle: Left ventricular ejection fraction, by estimation, is 60 to 65%. The left ventricle has normal function. The left ventricle has no regional wall motion abnormalities. The left ventricular internal cavity size was normal in size. There is  no left ventricular hypertrophy. Left ventricular diastolic parameters are indeterminate. Right Ventricle: The right ventricular size is normal. Right ventricular systolic function is normal. There is normal pulmonary artery systolic pressure. The tricuspid regurgitant velocity is 2.67 m/s, and  with an assumed right atrial pressure of 3 mmHg,  the estimated right ventricular systolic pressure is 31.5 mmHg. Left Atrium: Left atrial size was normal in size. Right Atrium: Right atrial size was normal in size. Pericardium: There is no evidence of pericardial effusion. Mitral Valve: The mitral valve is grossly normal. Mild mitral annular calcification. Mild mitral valve regurgitation. Tricuspid Valve: Tricuspid valve regurgitation is mild. Aortic Valve: The aortic valve is calcified. Aortic valve regurgitation is not visualized. Mild aortic stenosis is present. Aortic valve mean gradient measures 10.0 mmHg. Aortic valve peak gradient measures 19.7 mmHg. Aortic valve area, by VTI measures 0.91 cm. Pulmonic Valve: Pulmonic valve regurgitation is not visualized. Aorta: The aortic root and ascending aorta are structurally normal, with no evidence of dilitation. Venous: The inferior vena cava is normal in size with greater than 50% respiratory variability, suggesting right atrial pressure of 3 mmHg. IAS/Shunts: No atrial level shunt detected by color flow Doppler.  LEFT VENTRICLE PLAX 2D LVIDd:         3.90 cm LVIDs:         2.60 cm LV PW:         0.90 cm LV IVS:        0.70 cm LVOT diam:     1.60 cm LV SV:         39 LVOT Area:     2.01 cm  LV Volumes (MOD) LV vol d, MOD A4C: 47.2 ml LV vol s, MOD A4C: 12.6 ml LV SV MOD A4C:     47.2 ml RIGHT VENTRICLE RV S prime:     15.80 cm/s TAPSE (M-mode): 1.7 cm LEFT ATRIUM             RIGHT ATRIUM LA Vol (A2C):   42.0 ml RA Area:     9.91 cm LA Vol (A4C):   33.9 ml RA Volume:   19.60 ml LA Biplane Vol: 41.6 ml  AORTIC VALVE AV Area (Vmax):    0.76 cm AV Area (Vmean):   0.83 cm AV Area (VTI):     0.91 cm AV Vmax:           222.00 cm/s AV Vmean:          146.000 cm/s AV VTI:            0.434 m AV Peak Grad:      19.7 mmHg AV Mean Grad:      10.0 mmHg LVOT Vmax:         84.30 cm/s LVOT Vmean:  60.100 cm/s LVOT VTI:          0.196 m LVOT/AV VTI ratio: 0.45  AORTA Ao  Root diam: 3.10 cm Ao Asc diam:  2.60 cm MITRAL VALVE                TRICUSPID VALVE MV Area (PHT): 3.46 cm     TR Peak grad:   28.5 mmHg MV Decel Time: 219 msec     TR Vmax:        267.00 cm/s MR Peak grad: 141.6 mmHg MR Vmax:      595.00 cm/s   SHUNTS MV E velocity: 133.00 cm/s  Systemic VTI:  0.20 m MV A velocity: 79.80 cm/s   Systemic Diam: 1.60 cm MV E/A ratio:  1.67 [place] signed by Carolan Clines Signature Date/Time: 01/21/2023/1:40:22 PM    Final    MR PELVIS WO CM RECTAL CA STAGING  Result Date: 01/21/2023 CLINICAL DATA:  Potential rectal carcinoma. No rectal mass on colonoscopy. EXAM: MRI PELVIS WITHOUT CONTRAST TECHNIQUE: Multiplanar multisequence MR imaging of the pelvis was performed. No intravenous contrast was administered. Ultrasound gel was administered per rectum to optimize tumor evaluation. COMPARISON:  CT 01/17/2023 FINDINGS: Patient was moving during exam. Best images obtained with this limitation TUMOR LOCATION Large polypoid tumor in the distal rectum along the RIGHT wall. Rectal mass mass extends from 12:00 to 6:00 (image 4/12). The tumor extends essentially to the internal anal sphincter. Tumor distance from Anal Verge/Skin surface: 3.5 cm Tumor distance to Internal Anal sphincter: 0mm TUMOR DESCRIPTION Circumferential extent: 180 degrees along the RIGHT margin from 12:00 to 6:00 (image 14/series 4 Tumor Size and volume: 4 cm in length T - CATEGORY Extension through Muscularis Propria: T3 B. Tumor extends through the muscularis propria approximately 5 mm (image 16/10). This extension is along the RIGHT margin approximately 3 o'clock. Shortest Distance of any tumor/node from Mesorectal fascia: 3 mm (image 15/series 10) Extramural Vascular Invasion/Tumor Thrombus: No Invasion of Anterior Peritoneal Reflection: No Involvement of Adjacent Organs or Pelvic Sidewall: No Levator Ani Involvement: No N - CATEGORY Mesorectal Lymph Nodes >=71mm: 6 mm lymph node in the LEFT  mesorectum adjacent the tumor at 3 o'clock position (image 12/4 Extra-mesorectal Lymphadenopathy: No  Yes = N2 Other: None. IMPRESSION: 1. Exam limited by patient motion. 2. Rectal adenocarcinoma T stage: T3 B. 3. Rectal adenocarcinoma N stage: - N1. 4. Distance from tumor to the internal anal sphincter is 0 mm. Lesion in the distal RIGHT rectum extends to the internal anal sphincter. No clear invasion of the sphincter. Electronically Signed   By: Genevive Bi M.D.   On: 01/21/2023 12:33   CT CHEST W CONTRAST  Result Date: 01/20/2023 CLINICAL DATA:  Rectal cancer staging.  * Tracking Code: BO * EXAM: CT CHEST WITH CONTRAST TECHNIQUE: Multidetector CT imaging of the chest was performed during intravenous contrast administration. RADIATION DOSE REDUCTION: This exam was performed according to the departmental dose-optimization program which includes automated exposure control, adjustment of the mA and/or kV according to patient size and/or use of iterative reconstruction technique. CONTRAST:  75mL OMNIPAQUE IOHEXOL 300 MG/ML  SOLN COMPARISON:  None Available. FINDINGS: Cardiovascular: Coronary artery calcification and aortic atherosclerotic calcification. Mediastinum/Nodes: No axillary or supraclavicular adenopathy. No mediastinal or hilar adenopathy. No pericardial fluid. Esophagus normal. Lungs/Pleura: Spiculated 15 mm nodule in the central RIGHT upper lobe (image 58/series 6). Bilateral pleural effusions with associated mild atelectasis at the lung bases. Upper Abdomen: Limited view of the liver, kidneys,  pancreas are unremarkable. Normal adrenal glands. Musculoskeletal: No aggressive osseous lesion. IMPRESSION: 1. Spiculated nodule in the RIGHT upper lobe in patient with reported rectal carcinoma is concerning for metastatic lesion. 2. Coronary artery calcification and aortic atherosclerotic calcification. Electronically Signed   By: Genevive Bi M.D.   On: 01/20/2023 19:00     Anti-infectives: Anti-infectives (From admission, onward)    None        Assessment/Plan  89 y/o F who presented with hematochezia, found to have a partially obstructing rectal mass  - AFVSS, WBC 4.7, hgb 9.7 - CT chest w/ concern for pulmonary metastasis  - pelvic MRI performed yesterday stages her disease as T3bN1  - await pathology from flex sig 5/10  - CEA in process  - oncology is aware of patient and plans to formally consult once pathology results return - no emergent surgical needs. Clinically remains not obstructed.   FEN: FLD, prealbumin 14. Currently on boost breeze TID, try ensure TID ID [id number] Foley: none Dispo: med-surg   Paroxysmal a.fib with pauses - cards following, echo done yesterday shows EF 60-65% with no wall motion abnormalities. Mild AS.  H/o CVA DM 2 -hemoglobin A1c 6.6 Chronic anemia -iron deficiency Hypokalemia Low vitamin B12 Low prealbumin-14 Respiratory insufficiency   LOS: 4 days   I reviewed nursing notes, Consultant GI, onc notes, hospitalist notes, last 24 h vitals and pain scores, last 48 h intake and output, last 24 h labs and trends, and last 24 h imaging results.  This care required low level of medical decision making.   Hosie Spangle, PA-C [facility] Please see Amion for pager number during day hours 7:00am-4:30pm

## 2023-01-22 NOTE — Progress Notes (Signed)
PROGRESS NOTE    [name]  ZOX:[id number] DOB: 11-16-1928 DOA: 01/17/2023 PCP: Creola Corn, MD    Brief Narrative:   [name] is a 89 y.o. female with past medical history significant for intermittent GI bleed secondary to AVM, GERD, hypothyroidism who presented to [facility] ED on 5/8 with abdominal pain, distention, nausea/vomiting and bright red blood per rectum.  Family called her GI office and was encouraged to go to the ED for further evaluation.  Recently started on MiraLAX for fecal impaction now with with increased frequency of bowel movements.  In the ED, temperature 98.2 F, HR 90, RR 17, SpO2 90% on room air.  WBC 5.5, hemoglobin 11.4, platelet count 276.  Sodium 134, potassium 2.5, chloride 96, CO2 28, glucose 203, BUN 12, creatinine 0.69.  Lipase 34, AST 18, ALT 19, total bilirubin 0.7.  CT abdomen/pelvis with contrast on 5/8 with no acute findings.  GI was consulted.  TRH consulted for admission for further evaluation and management.  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  Recently seen by GI outpatient with fecal impaction and started on MiraLAX.  CT abdomen/pelvis with contrast on admission with no significant finding.  Initially thought to be related to encopresis/overflow from constipation; although rectal mass palpated on DRE by GI.  Patient underwent flexible sigmoidoscopy by Dr. Rhea Belton on 01/20/2023 with findings of partially obstructing tumor in the mid/distal rectum and biopsy was taken.  CT chest with contrast with spiculated nodule right upper lobe.  MR pelvis 5/12 with large polypoid tumor in distal rectum along right wall classified as T3b N1 -- General surgery, medical oncology, GI following; appreciate assistance -- CEA: Pending -- Pathology from rectal  biopsy: Moderately differentiated adenocarcinoma -- Norco 1-2 tablets every 4 hours as needed moderate pain -- Morphine 2 mg IV every 3 hours as needed severe pain -- Robaxin 500 mg p.o. every 8 hours as needed muscle spasms -- Full liquid diet -- Further per specialists  Hypokalemia Hypomagnesemia Potassium 3.2, magnesium 1.8 this morning.  Will replete potassium.  Patient complained of burning with IV magnesium and was discontinued.  -- Repeat electrolytes in a.m.  Iron/B12 deficiency anemia Anemia panel with iron 25, TIBC 378, ferritin 5, folate 11.8, vitamin B12 158. -- Received IV iron on 5/9 and 5/10 -- Vitamin B12 1000 mcg p.o. daily  Anxiety -- Lorazepam 1 mg p.o. every 4 hours as needed anxiety  Paroxysmal atrial fibrillation with postconversion pauses -- Cardiology following -- TTE: LVEF 60 to 65%, no regional LV wall motion normalities, indeterminate diastolic function, mild AS, normal IVC. -- Continue to monitor on telemetry  History of GIB secondary to AVMs -- Hgb 11.4>>9.6>9.4>9.7; stable -- CBC daily, transfuse for hemoglobin less than 7.0 or active bleeding  Hypothyroidism -- Levothyroxine 125 mcg p.o. daily  GERD -- Protonix 40 mg p.o. daily   DVT prophylaxis: SCDs Start: 01/18/23 0111    Code Status: Full Code Family Communication: No family present bedside this morning  Disposition Plan:  Level of care: Med-Surg Status is: Inpatient Remains inpatient appropriate because: Pending further recommendations per cardiology, GI, general surgery, medical oncology    Consultants:  [facility] General surgery Cardiology Medical oncology  Procedures:  Flex sigmoidoscopy, Dr. Rhea Belton 5/11 TTE: Pending  Antimicrobials:  None   Subjective: Patient seen examined at bedside,  resting comfortably.  Lying in bed.  No family present this morning.  No specific complaints.  Awaiting further recommendations from specialists including general  surgery, medical oncology. No other specific questions or concerns at this time.  Gust with RN this morning.  Denies headache, no dizziness, no chest pain, no palpitations, no current abdominal pain, no fever, no nausea/vomiting.  No acute concerns overnight per nurse staff.  Objective: Vitals:   01/21/23 0419 01/21/23 1421 01/21/23 2009 01/22/23 0418  BP: (!) 135/53 (!) 145/62 (!) 142/94 138/64  Pulse: 82 92 79 77  Resp: 20 18 20 18   Temp: 99.1 F (37.3 C) 97.7 F (36.5 C) 98.1 F (36.7 C) 97.8 F (36.6 C)  TempSrc: Oral Oral Oral Oral  SpO2: 99% 98% 97% 96%  Weight:      Height:       No intake or output data in the 24 hours ending 01/22/23 1012  Filed Weights   01/17/23 1914  Weight: 66.7 kg    Examination:  Physical Exam: GEN: NAD, alert and oriented x 3, chronically ill/elderly in appearance HEENT: NCAT, PERRL, EOMI, sclera clear, MMM, poor dentition PULM: CTAB w/o wheezes/crackles, normal respiratory effort, on 1 L nasal cannula with SpO2 96% at rest CV: RRR w/o M/G/R GI: abd soft, NTND, NABS, no R/G/M MSK: no peripheral edema, moves all extremities independently NEURO: No focal neurological deficits PSYCH: normal mood/affect Integumentary: dry/intact, no rashes or wounds    Data Reviewed: I have personally reviewed following labs and imaging studies  CBC: Recent Labs  Lab 01/15/23 1230 01/17/23 1940 01/18/23 0532 01/19/23 0540 01/20/23 0808 01/21/23 0453 01/22/23 0438  WBC 8.0 5.5 4.9 6.8 5.4 5.3 4.7  NEUTROABS 4.4 3.2  --   --   --   --   --   HGB 12.2 11.4* 9.5* 10.4* 9.6* 9.3* 9.7*  HCT 36.4 34.6* 29.8* 33.0* 31.3* 29.8* 31.3*  MCV 78.8 80.5 83.2 84.4 87.2 84.4 84.4  PLT 299.0 267 236 251 219 216 220   Basic Metabolic Panel: Recent Labs  Lab 01/17/23 1948 01/18/23 0532 01/18/23 1732 01/19/23 0540 01/20/23 0808 01/21/23 0453 01/22/23 0438  NA  --  136  --  139 134* 136 136  K  --  2.9* 3.6 3.3* 3.1* 3.5 3.2*  CL  --  103  --  104 100 101  99  CO2  --  28  --  29 28 28 29   GLUCOSE  --  123*  --  109* 109* 116* 104*  BUN  --  9  --  5* 8 7* 10  CREATININE  --  0.62  --  0.63 0.62 0.59 0.55  CALCIUM  --  8.0*  --  8.2* 8.0* 8.3* 8.3*  MG 1.6* 1.6*  --   --   --   --  1.8  PHOS 3.1 3.2  --   --   --   --   --    GFR: Estimated Creatinine Clearance: 39 mL/min (by C-G formula based on SCr of 0.55 mg/dL). Liver Function Tests: Recent Labs  Lab 01/17/23 1940 01/18/23 0532  AST 18 14*  ALT 19 16  ALKPHOS 69 50  BILITOT 0.7 0.8  PROT 6.7 5.6*  ALBUMIN 3.5 3.0*   Recent Labs  Lab 01/17/23 1940  LIPASE 34   No results for input(s): "AMMONIA" in the last 168 hours. Coagulation Profile: No results for input(s): "INR", "PROTIME" in the last 168 hours. Cardiac Enzymes: Recent Labs  Lab 01/17/23 1948  CKTOTAL 66   BNP (last 3 results) No results for input(s): "PROBNP" in the last 8760 hours. HbA1C: No results for input(s): "HGBA1C" in the last 72 hours. CBG: Recent Labs  Lab 01/21/23 1611 01/21/23 2011 01/21/23 2342 01/22/23 0414 01/22/23 0739  GLUCAP 95 145* 112* 95 100*   Lipid Profile: No results for input(s): "CHOL", "HDL", "LDLCALC", "TRIG", "CHOLHDL", "LDLDIRECT" in the last 72 hours. Thyroid Function Tests: No results for input(s): "TSH", "T4TOTAL", "FREET4", "T3FREE", "THYROIDAB" in the last 72 hours. Anemia Panel: No results for input(s): "VITAMINB12", "FOLATE", "FERRITIN", "TIBC", "IRON", "RETICCTPCT" in the last 72 hours. Sepsis Labs: Recent Labs  Lab 01/17/23 2236 01/18/23 0532  LATICACIDVEN 0.7 0.9    No results found for this or any previous visit (from the past 240 hour(s)).       Radiology Studies: ECHOCARDIOGRAM COMPLETE  Result Date: 01/21/2023    ECHOCARDIOGRAM REPORT   Patient Name:   ALIANI NAILLON Date of Exam: 01/21/2023 Medical Rec #:  [id number]     Height: Accession #:    [id number]    Weight: Date of Birth:  05-12-1929    BSA: Patient Age:    89 years      BP:            135/53 mmHg Patient Gender: F             HR:           96 bpm. Exam Location:  Inpatient Procedure: 2D Echo, Cardiac Doppler and Color Doppler Indications:    Atrial Fibrillation  History:        Patient has prior history of Echocardiogram examinations, most                 recent 07/10/2017. Stroke, Arrythmias:Atrial Fibrillation; Risk                 Factors:HLD.  Sonographer:    Lucy Antigua Referring Phys: [id number] PHILIP J NAHSER IMPRESSIONS  1. Left ventricular ejection fraction, by estimation, is 60 to 65%. The left ventricle has normal function. The left ventricle has no regional wall motion abnormalities. Left ventricular diastolic parameters are indeterminate.  2. Right ventricular systolic function is normal. The right ventricular size is normal. There is normal pulmonary artery systolic pressure.  3. The mitral valve is grossly normal. Mild mitral valve regurgitation.  4. The aortic valve is calcified. Aortic valve regurgitation is not visualized. Mild aortic valve stenosis.  5. The inferior vena cava is normal in size with greater than 50% respiratory variability, suggesting right atrial pressure of 3 mmHg. FINDINGS  Left Ventricle: Left ventricular ejection fraction, by estimation, is 60 to 65%. The left ventricle has normal function. The left ventricle has no regional wall motion abnormalities. The left ventricular internal cavity size was normal in size. There is  no left ventricular hypertrophy. Left ventricular diastolic parameters are indeterminate. Right Ventricle: The right ventricular size is normal. Right ventricular systolic function is normal. There is normal pulmonary artery systolic pressure. The tricuspid regurgitant velocity is 2.67 m/s, and with an assumed right atrial pressure of 3 mmHg,  the estimated right ventricular systolic pressure is 31.5 mmHg. Left Atrium: Left atrial size was normal in size. Right Atrium: Right atrial size was normal in size. Pericardium: There is no evidence of  pericardial effusion. Mitral Valve: The mitral valve is grossly normal. Mild mitral annular calcification. Mild mitral valve regurgitation. Tricuspid Valve: Tricuspid valve regurgitation is mild. Aortic Valve:  The aortic valve is calcified. Aortic valve regurgitation is not visualized. Mild aortic stenosis is present. Aortic valve mean gradient measures 10.0 mmHg. Aortic valve peak gradient measures 19.7 mmHg. Aortic valve area, by VTI measures 0.91 cm. Pulmonic Valve: Pulmonic valve regurgitation is not visualized. Aorta: The aortic root and ascending aorta are structurally normal, with no evidence of dilitation. Venous: The inferior vena cava is normal in size with greater than 50% respiratory variability, suggesting right atrial pressure of 3 mmHg. IAS/Shunts: No atrial level shunt detected by color flow Doppler.  LEFT VENTRICLE PLAX 2D LVIDd:         3.90 cm LVIDs:         2.60 cm LV PW:         0.90 cm LV IVS:        0.70 cm LVOT diam:     1.60 cm LV SV:         39 LVOT Area:     2.01 cm  LV Volumes (MOD) LV vol d, MOD A4C: 47.2 ml LV vol s, MOD A4C: 12.6 ml LV SV MOD A4C:     47.2 ml RIGHT VENTRICLE RV S prime:     15.80 cm/s TAPSE (M-mode): 1.7 cm LEFT ATRIUM             RIGHT ATRIUM LA Vol (A2C):   42.0 ml RA Area:     9.91 cm LA Vol (A4C):   33.9 ml RA Volume:   19.60 ml LA Biplane Vol: 41.6 ml  AORTIC VALVE AV Area (Vmax):    0.76 cm AV Area (Vmean):   0.83 cm AV Area (VTI):     0.91 cm AV Vmax:           222.00 cm/s AV Vmean:          146.000 cm/s AV VTI:            0.434 m AV Peak Grad:      19.7 mmHg AV Mean Grad:      10.0 mmHg LVOT Vmax:         84.30 cm/s LVOT Vmean:        60.100 cm/s LVOT VTI:          0.196 m LVOT/AV VTI ratio: 0.45  AORTA Ao Root diam: 3.10 cm Ao Asc diam:  2.60 cm MITRAL VALVE                TRICUSPID VALVE MV Area (PHT): 3.46 cm     TR Peak grad:   28.5 mmHg MV Decel Time: 219 msec     TR Vmax:        267.00 cm/s MR Peak grad: 141.6 mmHg MR Vmax:      595.00 cm/s    SHUNTS MV E velocity: 133.00 cm/s  Systemic VTI:  0.20 m MV A velocity: 79.80 cm/s   Systemic Diam: 1.60 cm MV E/A ratio:  1.67 [place] signed by Carolan Clines Signature Date/Time: 01/21/2023/1:40:22 PM    Final    MR PELVIS WO CM RECTAL CA STAGING  Result Date: 01/21/2023 CLINICAL DATA:  Potential rectal carcinoma. No rectal mass on colonoscopy. EXAM: MRI PELVIS WITHOUT CONTRAST TECHNIQUE: Multiplanar multisequence MR imaging of the pelvis was performed. No intravenous contrast was administered. Ultrasound gel was administered per rectum to optimize tumor evaluation. COMPARISON:  CT 01/17/2023 FINDINGS: Patient was moving during exam. Best images obtained with this limitation TUMOR LOCATION Large polypoid tumor in the distal rectum along  the RIGHT wall. Rectal mass mass extends from 12:00 to 6:00 (image 4/12). The tumor extends essentially to the internal anal sphincter. Tumor distance from Anal Verge/Skin surface: 3.5 cm Tumor distance to Internal Anal sphincter: 0mm TUMOR DESCRIPTION Circumferential extent: 180 degrees along the RIGHT margin from 12:00 to 6:00 (image 14/series 4 Tumor Size and volume: 4 cm in length T - CATEGORY Extension through Muscularis Propria: T3 B. Tumor extends through the muscularis propria approximately 5 mm (image 16/10). This extension is along the RIGHT margin approximately 3 o'clock. Shortest Distance of any tumor/node from Mesorectal fascia: 3 mm (image 15/series 10) Extramural Vascular Invasion/Tumor Thrombus: No Invasion of Anterior Peritoneal Reflection: No Involvement of Adjacent Organs or Pelvic Sidewall: No Levator Ani Involvement: No N - CATEGORY Mesorectal Lymph Nodes >=55mm: 6 mm lymph node in the LEFT mesorectum adjacent the tumor at 3 o'clock position (image 12/4 Extra-mesorectal Lymphadenopathy: No  Yes = N2 Other: None. IMPRESSION: 1. Exam limited by patient motion. 2. Rectal adenocarcinoma T stage: T3 B. 3. Rectal adenocarcinoma N stage: - N1. 4.  Distance from tumor to the internal anal sphincter is 0 mm. Lesion in the distal RIGHT rectum extends to the internal anal sphincter. No clear invasion of the sphincter. Electronically Signed   By: Genevive Bi M.D.   On: 01/21/2023 12:33        Scheduled Meds:  atorvastatin  20 mg Oral Daily   vitamin B-12  1,000 mcg Oral Daily   feeding supplement  1 Container Oral TID BM   feeding supplement  237 mL Oral TID WC   insulin aspart  0-9 Units Subcutaneous Q4H   levothyroxine  125 mcg Oral Q0600   nystatin  5 mL Oral QID   pantoprazole  40 mg Oral Daily   polyethylene glycol  17 g Oral Daily   potassium chloride  40 mEq Oral Q3H   Continuous Infusions:   LOS: 4 days    Time spent: 50 minutes spent on chart review, discussion with nursing staff, consultants, updating family and interview/physical exam; more than 50% of that time was spent in counseling and/or coordination of care.    Alvira Philips [place], DO [facility] Available via Epic secure chat 7am-7pm After these hours, please refer to coverage provider listed on [email or web address] 01/22/2023, 10:12 AM

## 2023-01-23 DIAGNOSIS — E876 Hypokalemia: Secondary | ICD-10-CM | POA: Diagnosis not present

## 2023-01-23 DIAGNOSIS — Z7189 Other specified counseling: Secondary | ICD-10-CM

## 2023-01-23 DIAGNOSIS — I495 Sick sinus syndrome: Secondary | ICD-10-CM | POA: Diagnosis not present

## 2023-01-23 DIAGNOSIS — R911 Solitary pulmonary nodule: Secondary | ICD-10-CM

## 2023-01-23 DIAGNOSIS — I480 Paroxysmal atrial fibrillation: Secondary | ICD-10-CM | POA: Diagnosis not present

## 2023-01-23 DIAGNOSIS — C20 Malignant neoplasm of rectum: Secondary | ICD-10-CM | POA: Diagnosis not present

## 2023-01-23 LAB — GLUCOSE, CAPILLARY
Glucose-Capillary: 103 mg/dL — ABNORMAL HIGH (ref 70–99)
Glucose-Capillary: 109 mg/dL — ABNORMAL HIGH (ref 70–99)
Glucose-Capillary: 116 mg/dL — ABNORMAL HIGH (ref 70–99)
Glucose-Capillary: 136 mg/dL — ABNORMAL HIGH (ref 70–99)
Glucose-Capillary: 138 mg/dL — ABNORMAL HIGH (ref 70–99)
Glucose-Capillary: 145 mg/dL — ABNORMAL HIGH (ref 70–99)
Glucose-Capillary: 187 mg/dL — ABNORMAL HIGH (ref 70–99)

## 2023-01-23 LAB — BASIC METABOLIC PANEL
Anion gap: 5 (ref 5–15)
BUN: 12 mg/dL (ref 8–23)
CO2: 31 mmol/L (ref 22–32)
Calcium: 8.5 mg/dL — ABNORMAL LOW (ref 8.9–10.3)
Chloride: 101 mmol/L (ref 98–111)
Creatinine, Ser: 0.68 mg/dL (ref 0.44–1.00)
GFR, Estimated: >60 mL/min (ref >60)
Glucose, Bld: 120 mg/dL — ABNORMAL HIGH (ref 70–99)
Potassium: 4 mmol/L (ref 3.5–5.1)
Sodium: 137 mmol/L (ref 135–145)

## 2023-01-23 LAB — CBC
HCT: 30.4 % — ABNORMAL LOW (ref 36.0–46.0)
Hemoglobin: 9.4 g/dL — ABNORMAL LOW (ref 12.0–15.0)
MCH: 26.6 pg (ref 26.0–34.0)
MCHC: 30.9 g/dL (ref 30.0–36.0)
MCV: 86.1 fL (ref 80.0–100.0)
Platelets: 221 10*3/uL (ref 150–400)
RBC: 3.53 MIL/uL — ABNORMAL LOW (ref 3.87–5.11)
RDW: 14.3 % (ref 11.5–15.5)
WBC: 4.3 10*3/uL (ref 4.0–10.5)
nRBC: 0 % (ref 0.0–0.2)

## 2023-01-23 LAB — CEA: CEA: 3.5 ng/mL (ref 0.0–4.7)

## 2023-01-23 LAB — MAGNESIUM: Magnesium: 1.9 mg/dL (ref 1.7–2.4)

## 2023-01-23 MED ORDER — ALBUTEROL SULFATE (2.5 MG/3ML) 0.083% IN NEBU
2.5000 mg | INHALATION_SOLUTION | Freq: Once | RESPIRATORY_TRACT | Status: AC
  Administered 2023-01-23: 2.5 mg via RESPIRATORY_TRACT
  Filled 2023-01-23: qty 3

## 2023-01-23 MED ORDER — ALPRAZOLAM 0.5 MG PO TABS
0.5000 mg | ORAL_TABLET | Freq: Three times a day (TID) | ORAL | Status: DC | PRN
  Administered 2023-01-23 – 2023-01-28 (×10): 0.5 mg via ORAL
  Filled 2023-01-23 (×10): qty 1

## 2023-01-23 NOTE — Progress Notes (Addendum)
Rounding Note    Patient Name: [name] Date of Encounter: 01/23/2023  [facility] Cardiologist: New (Dr. Elease Hashimoto)  Subjective   No acute overnight events. No recurrent atrial fibrillation or post-conversion pauses overnight. She denies any chest pain, shortness of breath, or palpitations. She does report a cough and feeling like she is wheezing this morning. She is currently on 1L of O2 (not on any supplemental O2 at home).  Inpatient Medications    Scheduled Meds:  amiodarone  400 mg Oral Daily   atorvastatin  20 mg Oral Daily   vitamin B-12  1,000 mcg Oral Daily   feeding supplement  1 Container Oral TID BM   feeding supplement  237 mL Oral TID WC   insulin aspart  0-9 Units Subcutaneous Q4H   levothyroxine  125 mcg Oral Q0600   nystatin  5 mL Oral QID   pantoprazole  40 mg Oral Daily   polyethylene glycol  17 g Oral Daily   Continuous Infusions:  PRN Meds: acetaminophen **OR** acetaminophen, albuterol, amisulpride, guaiFENesin-dextromethorphan, HYDROcodone-acetaminophen, LORazepam, methocarbamol, morphine injection, ondansetron **OR** ondansetron (ZOFRAN) IV, ondansetron (ZOFRAN) IV, sodium phosphate   Vital Signs    Vitals:   01/22/23 0418 01/22/23 1320 01/22/23 1945 01/23/23 0400  BP: 138/64 (!) 150/67 (!) 142/64 (!) 152/62  Pulse: 77 84 76 69  Resp: 18 14 20 17   Temp: 97.8 F (36.6 C) 97.7 F (36.5 C) 97.9 F (36.6 C) 98 F (36.7 C)  TempSrc: Oral Oral    SpO2: 96% 98% 97%   Weight:      Height:        Intake/Output Summary (Last 24 hours) at 01/23/2023 0725 Last data filed at 01/22/2023 0945 Gross per 24 hour  Intake 518 ml  Output --  Net 518 ml      01/17/2023    7:14 PM 01/15/2023   11:26 AM 11/04/2022    7:06 PM  Last 3 Weights  Weight (lbs) 147 lb 147 lb 152 lb  Weight (kg) 66.679 kg 66.679 kg 68.947 kg      Telemetry    Normal sinus rhythm with rates ranging from the 60s to the 90s but mostly in the 60s to 70s. Occasional  PVCs. - Personally Reviewed  ECG    No new ECG tracing today. - Personally Reviewed  Physical Exam   GEN: Elderly Caucasian female in no acute distress.   Neck: No JVD. Cardiac: RRR. Soft I-II/VI murmur noted along left sternal border. No rubs or gallops. Marland Kitchen  Respiratory: No increased work of breathing. Soft expiratory wheeze noted. No rhonchi or rales. GI: Soft, non-distended, and non-tender. MS: No lower extremity edema. No deformity. Skin: Warm and dry. Neuro:  No focal deficits. Psych: Normal affect. Responds appropriately.   Labs    High Sensitivity Troponin:  No results for input(s): "TROPONINIHS" in the last 720 hours.   Chemistry Recent Labs  Lab 01/17/23 1940 01/17/23 1948 01/18/23 0532 01/18/23 1732 01/21/23 0453 01/22/23 0438 01/23/23 0539  NA 134*  --  136   < > 136 136 137  K 2.5*  --  2.9*   < > 3.5 3.2* 4.0  CL 96*  --  103   < > 101 99 101  CO2 28  --  28   < > 28 29 31   GLUCOSE 203*  --  123*   < > 116* 104* 120*  BUN 12  --  9   < > 7* 10  12  CREATININE 0.69  --  0.62   < > 0.59 0.55 0.68  CALCIUM 8.7*  --  8.0*   < > 8.3* 8.3* 8.5*  MG  --    < > 1.6*  --   --  1.8 1.9  PROT 6.7  --  5.6*  --   --   --   --   ALBUMIN 3.5  --  3.0*  --   --   --   --   AST 18  --  14*  --   --   --   --   ALT 19  --  16  --   --   --   --   ALKPHOS 69  --  50  --   --   --   --   BILITOT 0.7  --  0.8  --   --   --   --   GFRNONAA >60  --  >60   < > >60 >60 >60  ANIONGAP 10  --  5   < > 7 8 5    < > = values in this interval not displayed.    Lipids No results for input(s): "CHOL", "TRIG", "HDL", "LABVLDL", "LDLCALC", "CHOLHDL" in the last 168 hours.  Hematology Recent Labs  Lab 01/21/23 0453 01/22/23 0438 01/23/23 0539  WBC 5.3 4.7 4.3  RBC 3.53* 3.71* 3.53*  HGB 9.3* 9.7* 9.4*  HCT 29.8* 31.3* 30.4*  MCV 84.4 84.4 86.1  MCH 26.3 26.1 26.6  MCHC 31.2 31.0 30.9  RDW 14.2 14.2 14.3  PLT 216 220 221   Thyroid  Recent Labs  Lab 01/17/23 2230  TSH  0.831    BNPNo results for input(s): "BNP", "PROBNP" in the last 168 hours.  DDimer No results for input(s): "DDIMER" in the last 168 hours.   Radiology    ECHOCARDIOGRAM COMPLETE  Result Date: 01/21/2023    ECHOCARDIOGRAM REPORT   Patient Name:   [name] Date of Exam: 01/21/2023 Medical Rec #:  [id number]     Height: Accession #:    [id number]    Weight: Date of Birth:  11/22/1928    BSA: Patient Age:    89 years      BP:           135/53 mmHg Patient Gender: F             HR:           96 bpm. Exam Location:  Inpatient Procedure: 2D Echo, Cardiac Doppler and Color Doppler Indications:    Atrial Fibrillation  History:        Patient has prior history of Echocardiogram examinations, most                 recent 07/10/2017. Stroke, Arrythmias:Atrial Fibrillation; Risk                 Factors:HLD.  Sonographer:    Lucy Antigua Referring Phys: [id number] PHILIP J NAHSER IMPRESSIONS  1. Left ventricular ejection fraction, by estimation, is 60 to 65%. The left ventricle has normal function. The left ventricle has no regional wall motion abnormalities. Left ventricular diastolic parameters are indeterminate.  2. Right ventricular systolic function is normal. The right ventricular size is normal. There is normal pulmonary artery systolic pressure.  3. The mitral valve is grossly normal. Mild mitral valve regurgitation.  4. The aortic valve is calcified. Aortic valve regurgitation is not visualized. Mild aortic valve stenosis.  5. The inferior vena cava is normal in size with greater than 50% respiratory variability, suggesting right atrial pressure of 3 mmHg. FINDINGS  Left Ventricle: Left ventricular ejection fraction, by estimation, is 60 to 65%. The left ventricle has normal function. The left ventricle has no regional wall motion abnormalities. The left ventricular internal cavity size was normal in size. There is  no left ventricular hypertrophy. Left ventricular diastolic parameters are indeterminate. Right  Ventricle: The right ventricular size is normal. Right ventricular systolic function is normal. There is normal pulmonary artery systolic pressure. The tricuspid regurgitant velocity is 2.67 m/s, and with an assumed right atrial pressure of 3 mmHg,  the estimated right ventricular systolic pressure is 31.5 mmHg. Left Atrium: Left atrial size was normal in size. Right Atrium: Right atrial size was normal in size. Pericardium: There is no evidence of pericardial effusion. Mitral Valve: The mitral valve is grossly normal. Mild mitral annular calcification. Mild mitral valve regurgitation. Tricuspid Valve: Tricuspid valve regurgitation is mild. Aortic Valve: The aortic valve is calcified. Aortic valve regurgitation is not visualized. Mild aortic stenosis is present. Aortic valve mean gradient measures 10.0 mmHg. Aortic valve peak gradient measures 19.7 mmHg. Aortic valve area, by VTI measures 0.91 cm. Pulmonic Valve: Pulmonic valve regurgitation is not visualized. Aorta: The aortic root and ascending aorta are structurally normal, with no evidence of dilitation. Venous: The inferior vena cava is normal in size with greater than 50% respiratory variability, suggesting right atrial pressure of 3 mmHg. IAS/Shunts: No atrial level shunt detected by color flow Doppler.  LEFT VENTRICLE PLAX 2D LVIDd:         3.90 cm LVIDs:         2.60 cm LV PW:         0.90 cm LV IVS:        0.70 cm LVOT diam:     1.60 cm LV SV:         39 LVOT Area:     2.01 cm  LV Volumes (MOD) LV vol d, MOD A4C: 47.2 ml LV vol s, MOD A4C: 12.6 ml LV SV MOD A4C:     47.2 ml RIGHT VENTRICLE RV S prime:     15.80 cm/s TAPSE (M-mode): 1.7 cm LEFT ATRIUM             RIGHT ATRIUM LA Vol (A2C):   42.0 ml RA Area:     9.91 cm LA Vol (A4C):   33.9 ml RA Volume:   19.60 ml LA Biplane Vol: 41.6 ml  AORTIC VALVE AV Area (Vmax):    0.76 cm AV Area (Vmean):   0.83 cm AV Area (VTI):     0.91 cm AV Vmax:           222.00 cm/s AV Vmean:          146.000 cm/s AV VTI:             0.434 m AV Peak Grad:      19.7 mmHg AV Mean Grad:      10.0 mmHg LVOT Vmax:         84.30 cm/s LVOT Vmean:        60.100 cm/s LVOT VTI:          0.196 m LVOT/AV VTI ratio: 0.45  AORTA Ao Root diam: 3.10 cm Ao Asc diam:  2.60 cm MITRAL VALVE                TRICUSPID VALVE MV Area (  PHT): 3.46 cm     TR Peak grad:   28.5 mmHg MV Decel Time: 219 msec     TR Vmax:        267.00 cm/s MR Peak grad: 141.6 mmHg MR Vmax:      595.00 cm/s   SHUNTS MV E velocity: 133.00 cm/s  Systemic VTI:  0.20 m MV A velocity: 79.80 cm/s   Systemic Diam: 1.60 cm MV E/A ratio:  1.67 [place] signed by Carolan Clines Signature Date/Time: 01/21/2023/1:40:22 PM    Final    MR PELVIS WO CM RECTAL CA STAGING  Result Date: 01/21/2023 CLINICAL DATA:  Potential rectal carcinoma. No rectal mass on colonoscopy. EXAM: MRI PELVIS WITHOUT CONTRAST TECHNIQUE: Multiplanar multisequence MR imaging of the pelvis was performed. No intravenous contrast was administered. Ultrasound gel was administered per rectum to optimize tumor evaluation. COMPARISON:  CT 01/17/2023 FINDINGS: Patient was moving during exam. Best images obtained with this limitation TUMOR LOCATION Large polypoid tumor in the distal rectum along the RIGHT wall. Rectal mass mass extends from 12:00 to 6:00 (image 4/12). The tumor extends essentially to the internal anal sphincter. Tumor distance from Anal Verge/Skin surface: 3.5 cm Tumor distance to Internal Anal sphincter: 0mm TUMOR DESCRIPTION Circumferential extent: 180 degrees along the RIGHT margin from 12:00 to 6:00 (image 14/series 4 Tumor Size and volume: 4 cm in length T - CATEGORY Extension through Muscularis Propria: T3 B. Tumor extends through the muscularis propria approximately 5 mm (image 16/10). This extension is along the RIGHT margin approximately 3 o'clock. Shortest Distance of any tumor/node from Mesorectal fascia: 3 mm (image 15/series 10) Extramural Vascular Invasion/Tumor Thrombus: No Invasion  of Anterior Peritoneal Reflection: No Involvement of Adjacent Organs or Pelvic Sidewall: No Levator Ani Involvement: No N - CATEGORY Mesorectal Lymph Nodes >=39mm: 6 mm lymph node in the LEFT mesorectum adjacent the tumor at 3 o'clock position (image 12/4 Extra-mesorectal Lymphadenopathy: No  Yes = N2 Other: None. IMPRESSION: 1. Exam limited by patient motion. 2. Rectal adenocarcinoma T stage: T3 B. 3. Rectal adenocarcinoma N stage: - N1. 4. Distance from tumor to the internal anal sphincter is 0 mm. Lesion in the distal RIGHT rectum extends to the internal anal sphincter. No clear invasion of the sphincter. Electronically Signed   By: Genevive Bi M.D.   On: 01/21/2023 12:33    Cardiac Studies   Echocardiogram 01/21/2023: Impressions: 1. Left ventricular ejection fraction, by estimation, is 60 to 65%. The  left ventricle has normal function. The left ventricle has no regional  wall motion abnormalities. Left ventricular diastolic parameters are  indeterminate.   2. Right ventricular systolic function is normal. The right ventricular  size is normal. There is normal pulmonary artery systolic pressure.   3. The mitral valve is grossly normal. Mild mitral valve regurgitation.   4. The aortic valve is calcified. Aortic valve regurgitation is not  visualized. Mild aortic valve stenosis.   5. The inferior vena cava is normal in size with greater than 50%  respiratory variability, suggesting right atrial pressure of 3 mmHg.    Patient Profile     89 y.o. female with a history of hypertension, hyperlipidemia, type 2 diabetes mellitus, hypothyroidism, stroke, anemia, and leukemia in remission who presented on 01/17/2023 with abdominal pain/ distension with associated nausea/ vomiting and intermittent rectal bleeding. Work-up revealed a rectal mass and biopsies showed a moderately differentiated adenocarcinoma. CT showed a solitary spiculated nodule in the right lung concerning for a metastatic lesion.  Cardiology  was consulted on 5/11/204 for evaluation of prolonged sinus pauses. Per review of telemetry, patient was noted to have new onset atrial fibrillation with post-conversion pauses (the longest one being 10 seconds).   Assessment & Plan    New Onset Atrial Fibrillation Tachy-Brady Syndrome with Post-Conversion Pauses Patient was admitted with GI symptoms and ultimately diagnosed with rectal cancer. She was noted to have new onset atrial fibrillation with post-conversion pauses (the longest being 10 seconds). Echo showed normal LV function. Patient noted to have hypokalemia and hypomagnesemia on arrival with potassium as low as 2.9 and magnesium as low as 1.6 but both have been repleted. TSH normal. She was started on Amiodarone on 5/13 to help suppress atrial fibrillation and prevent post-conversion pauses. - Maintaining sinus rhythm. No recurrent atrial fibrillation since starting Amiodarone. - Continue Amiodarone 400mg  daily. - Continue to avoid all AV nodal agents.  - CHA2DS2-VASc = 8 (coronary artery calcifications on CT, HTN, DM, stroke x2, age x2, female). Currently not on any anticoagulation. Plan is to start Eliquis once it is clear that there are no plans for invasive procedures. However, will discuss starting IV Heparin in the meantime with MD given high CHA2DS2-VASc score.  Wheezing Patient reports a cough and feeling of wheezing this morning but no real shortness of breath. No history of asthma/ COPD. Chest x-ray on admission showed interstitial coarsening in the lower lungs similar to 11/2017 but no acute findings. - She does have some soft expiratory wheezing on exam. - Will order a one time albuterol nebulizer. Otherwise, per primary team.  Hypertension BP mildly elevated but reasonable given advanced age. - Home Amlodipine was held on admission. Can restart if needed.  Hypokalemia Hypomagnesemia Potassium 2.9 and magnesium 1.6 on arrival. Supplemented and normalized. -  Potassium 4.0 today.  - Magnesium 1.9 today.  Rectal Adenocarcinoma Diagnosed this admission. Chest CT showed one solitary spiculated pulmonary nodule concerning for metastasis. She has been seen by Oncology. Treatment not completely finalized at this time but sounds like she will likely be getting chemotherapy (will need a Port-A-Cath placed) +/- radiation.  - Management per Oncology.  Otherwise, per primary team: - Hyperlipidemia - Type 2 diabetes mellitus - Hypothyroidism - Chronic anemia - History of GI bleeds secondary to AVMs - GERD - Anxiety    For questions or updates, please contact [facility] Please consult [email or web address] for contact info under        Signed, Corrin Parker, PA-C  01/23/2023, 7:25 AM    I have seen and examined the patient along with Corrin Parker, PA-C .  I have reviewed the chart, notes and new data.  I agree with PA/NP's note.  Key new complaints: No cardiovascular complaints Key examination changes: Regular rate and rhythm on exam Key new findings / data: Review of telemetry shows remarkable complete abolition of episodes of atrial fibrillation and consequently resolution of the postconversion pauses.  This has been achieved without any significant problems with bradycardia.  PLAN: Continue amiodarone 400 mg daily for 2 weeks, then reduce to 200 mg daily. I suspect we will keep her on this medication long-term.   If her rhythm continues as it is right now, she will not need pacemaker implantation. Plan to check liver function test and thyroid function tests at least every 6 months.  Avoid sun exposure due to photosensitivity from the drug.  Be aware of multiple possible drug interactions with this medication.  Reports unexpected/unexplained pulmonary symptoms such as shortness of breath  or cough promptly.  Eye exam at least once a year.  Thurmon Fair, MD, [facility] [phone] 01/23/2023, 2:50 PM

## 2023-01-23 NOTE — Care Management Important Message (Signed)
Important Message  Patient Details IM Letter given. Name: Lauralei R Lajara MRN: [id number] Date of Birth: 1929-05-26   Medicare Important Message Given:  Yes     [name] 01/23/2023, 10:09 AM

## 2023-01-23 NOTE — Consult Note (Signed)
NAME:  [name], MRN:  [id number], DOB:  1929/01/28, LOS: 5 ADMISSION DATE:  01/17/2023, CONSULTATION DATE:  5/14 REFERRING MD:  Dr. [place], CHIEF COMPLAINT:  Lung nodule   History of Present Illness:  89 year old female with past medical history as below, which is significant for leukemia, anemia, small bowel AVMs, colon AVM, diabetes, hypertension, stroke, and hypothyroidism. She presented to [facility] ED 5/9 with complaints of abdominal pain, abdominal distention, and hematochezia. Workup in the ED significant for electrolyte abnormalities and a CT abdomen without intraabdominal pathology. She was admitted to the hospitalist service for tx of GI bleeding, hypokalemia, and hypomagnesemia. She was evaluated by gastroenterology and underwent colonoscopy 5/10, which showed a firm mass in the rectum. Biopsies positive for moderately differentiated adenocarcinoma. Rectal MRI confirmed mass and demonstrated some lymph node involvement. CT of the chest showed a single nodule in the right upper lobe. Oncology is requesting evaluation for bronchoscopy and possible biopsy. PCCM consulted. Hospital course otherwise complicated by new onset AF/Tachy-brady syndrome with pauses.   Pertinent  Medical History   has a past medical history of Anemia, Anxiety, Colon polyps, DDD (degenerative disc disease), cervical, Diabetes mellitus, Diabetes mellitus, type 2 (HCC), Hyperlipidemia, Hypertension, Hypothyroidism, Leukemia (HCC) (in remission), Mucous retention cyst of maxillary sinus, Osteoporosis, Pinched nerve (In neck), Plantar fasciitis, Stroke (HCC), and Vitamin D deficiency.   Significant Hospital Events: Including procedures, antibiotic start and stop dates in addition to other pertinent events   5/9 admit for abd pain and GIB 5/10 Colonoscopy: 8cm rectal mass. Biopsies show adenocarcinoma 5/11 CT chest: RUL spiculated nodule.  5/12 MR pelvis: rectal mass with lymph involvment T3B N1 5/12 Echo: LVEF  60-65%. Normal RV. Mild MR, mild AS.  5/14 PCCM consult  Interim History / Subjective:    Objective   Blood pressure (!) 152/62, pulse 69, temperature 98 F (36.7 C), resp. rate 17, height 5' (1.524 m), weight 66.7 kg, SpO2 98 %.        Intake/Output Summary (Last 24 hours) at 01/23/2023 1149 Last data filed at 01/23/2023 0930 Gross per 24 hour  Intake 240 ml  Output --  Net 240 ml   Filed Weights   01/17/23 1914  Weight: 66.7 kg    Examination: General: pleasant elderly female in NAD HENT: [place]/AT, PERRL, no JVD Lungs: Clear bilateral breath sounds Cardiovascular: RRR, 3/6 systolic murmur Abdomen: Soft, NT, ND Extremities: No deformity or edema Neuro: Alert, oriented, non-focal  Resolved Hospital Problem list     Assessment & Plan:   Lung nodule: 15mm spiculated nodule in the right upper lobe in the setting of newly diagnosed rectal adenocarcinoma.  - Should be amenable to navigational bronchoscopy - Patient and family are interested in pursuing biopsy - Plan to involve our advanced bronchoscopy team as far as candidacy and scheduling. Can be done as an outpatient if she is nearing discharge.   Rectal adenocarcinoma T3bN1 - per oncology  New onset AF Tachy brady syndrome - management per cardiology  Hypothyroid Anemia Electrolyte abnormalities GERD - Per primary    Best Practice (right click and "Reselect all SmartList Selections" daily)   Per primary  Labs   CBC: Recent Labs  Lab 01/17/23 1940 01/18/23 0532 01/19/23 0540 01/20/23 0808 01/21/23 0453 01/22/23 0438 01/23/23 0539  WBC 5.5   < > 6.8 5.4 5.3 4.7 4.3  NEUTROABS 3.2  --   --   --   --   --   --   HGB 11.4*   < >  10.4* 9.6* 9.3* 9.7* 9.4*  HCT 34.6*   < > 33.0* 31.3* 29.8* 31.3* 30.4*  MCV 80.5   < > 84.4 87.2 84.4 84.4 86.1  PLT 267   < > 251 219 216 220 221   < > = values in this interval not displayed.    Basic Metabolic Panel: Recent Labs  Lab 01/17/23 1948 01/18/23 0532  01/18/23 1732 01/19/23 0540 01/20/23 3016 01/21/23 0453 01/22/23 0438 01/23/23 0539  NA  --  136  --  139 134* 136 136 137  K  --  2.9*   < > 3.3* 3.1* 3.5 3.2* 4.0  CL  --  103  --  104 100 101 99 101  CO2  --  28  --  29 28 28 29 31   GLUCOSE  --  123*  --  109* 109* 116* 104* 120*  BUN  --  9  --  5* 8 7* 10 12  CREATININE  --  0.62  --  0.63 0.62 0.59 0.55 0.68  CALCIUM  --  8.0*  --  8.2* 8.0* 8.3* 8.3* 8.5*  MG 1.6* 1.6*  --   --   --   --  1.8 1.9  PHOS 3.1 3.2  --   --   --   --   --   --    < > = values in this interval not displayed.   GFR: Estimated Creatinine Clearance: 39 mL/min (by C-G formula based on SCr of 0.68 mg/dL). Recent Labs  Lab 01/17/23 2236 01/18/23 0532 01/19/23 0540 01/20/23 0808 01/21/23 0453 01/22/23 0438 01/23/23 0539  WBC  --  4.9   < > 5.4 5.3 4.7 4.3  LATICACIDVEN 0.7 0.9  --   --   --   --   --    < > = values in this interval not displayed.    Liver Function Tests: Recent Labs  Lab 01/17/23 1940 01/18/23 0532  AST 18 14*  ALT 19 16  ALKPHOS 69 50  BILITOT 0.7 0.8  PROT 6.7 5.6*  ALBUMIN 3.5 3.0*   Recent Labs  Lab 01/17/23 1940  LIPASE 34   No results for input(s): "AMMONIA" in the last 168 hours.  ABG    Component Value Date/Time   TCO2 25 09/27/2011 2337     Coagulation Profile: No results for input(s): "INR", "PROTIME" in the last 168 hours.  Cardiac Enzymes: Recent Labs  Lab 01/17/23 1948  CKTOTAL 66    HbA1C: Hgb A1c MFr Bld  Date/Time Value Ref Range Status  01/17/2023 10:36 PM 6.6 (H) 4.8 - 5.6 % Final    Comment:    (NOTE) Pre diabetes:          5.7%-6.4%  Diabetes:              >6.4%  Glycemic control for   <7.0% adults with diabetes   07/10/2017 04:16 AM 5.6 4.8 - 5.6 % Final    Comment:    (NOTE) Pre diabetes:          5.7%-6.4% Diabetes:              >6.4% Glycemic control for   <7.0% adults with diabetes     CBG: Recent Labs  Lab 01/22/23 1946 01/23/23 0009 01/23/23 0449  01/23/23 0754 01/23/23 1141  GLUCAP 152* 136* 103* 109* 187*    Review of Systems:   Bolds are positive  Constitutional: weight loss, gain, night sweats, Fevers, chills, fatigue .  HEENT: headaches, Sore throat, sneezing, nasal congestion, post nasal drip, Difficulty swallowing, Tooth/dental problems, visual complaints visual changes, ear ache CV:  chest pain, radiates:,Orthopnea, PND, swelling in lower extremities, dizziness, palpitations, syncope.  GI  heartburn, indigestion, abdominal pain, nausea, vomiting, diarrhea, change in bowel habits, loss of appetite, bloody stools (on paper, not in bowl).  Resp: cough, productive: , hemoptysis, dyspnea, chest pain, pleuritic.  Skin: rash or itching or icterus GU: dysuria, change in color of urine, urgency or frequency. flank pain, hematuria  MS: joint pain or swelling. decreased range of motion  Psych: change in mood or affect. depression or anxiety.  Neuro: difficulty with speech, weakness, numbness, ataxia    Past Medical History:  She,  has a past medical history of Anemia, Anxiety, Colon polyps, DDD (degenerative disc disease), cervical, Diabetes mellitus, Diabetes mellitus, type 2 (HCC), Hyperlipidemia, Hypertension, Hypothyroidism, Leukemia (HCC) (in remission), Mucous retention cyst of maxillary sinus, Osteoporosis, Pinched nerve (In neck), Plantar fasciitis, Stroke (HCC), and Vitamin D deficiency.   Surgical History:   Past Surgical History:  Procedure Laterality Date   APPENDECTOMY     BIOPSY  01/19/2023   Procedure: BIOPSY;  Surgeon: Beverley Fiedler, MD;  Location: [facility]COPY;  Service: Gastroenterology;;   CHOLECYSTECTOMY     2004   COLONOSCOPY N/A 02/24/2013   Procedure: COLONOSCOPY;  Surgeon: Meryl Dare, MD;  Location: [facility];  Service: Endoscopy;  Laterality: N/A;   COLONOSCOPY W/ POLYPECTOMY     COLONOSCOPY WITH PROPOFOL N/A 12/03/2017   Procedure: COLONOSCOPY WITH PROPOFOL;  Surgeon: Rachael Fee, MD;   Location: [facility] ENDOSCOPY;  Service: Gastroenterology;  Laterality: N/A;   ESOPHAGOGASTRODUODENOSCOPY     ESOPHAGOGASTRODUODENOSCOPY N/A 02/24/2013   Procedure: ESOPHAGOGASTRODUODENOSCOPY (EGD);  Surgeon: Meryl Dare, MD;  Location: [facility];  Service: Endoscopy;  Laterality: N/A;   ESOPHAGOGASTRODUODENOSCOPY (EGD) WITH PROPOFOL N/A 12/03/2017   Procedure: ESOPHAGOGASTRODUODENOSCOPY (EGD) WITH PROPOFOL;  Surgeon: Rachael Fee, MD;  Location: [facility] ENDOSCOPY;  Service: Gastroenterology;  Laterality: N/A;   EYE SURGERY     bilateral catracts   FLEXIBLE SIGMOIDOSCOPY N/A 01/19/2023   Procedure: FLEXIBLE SIGMOIDOSCOPY;  Surgeon: Beverley Fiedler, MD;  Location: [facility] ENDOSCOPY;  Service: Gastroenterology;  Laterality: N/A;     Social History:   reports that she has quit smoking. She has never used smokeless tobacco. She reports that she does not drink alcohol and does not use drugs.   Family History:  Her family history includes Colon cancer in her son; Diabetes in an other family member. There is no history of Esophageal cancer or Stomach cancer.   Allergies Allergies  Allergen Reactions   Prednisone     Climbs the walls     Home Medications  Prior to Admission medications   Medication Sig Start Date End Date Taking? Authorizing Provider  ALPRAZolam Prudy Feeler) 0.5 MG tablet Take 0.5 mg by mouth 3 (three) times daily as needed for anxiety. For anxiety   Yes [provider]  amLODipine (NORVASC) 2.5 MG tablet Take 10 mg by mouth daily.    Yes [provider]  atorvastatin (LIPITOR) 20 MG tablet Take 20 mg by mouth daily.   Yes [provider]  levothyroxine (SYNTHROID) 125 MCG tablet Take 125 mcg by mouth daily before breakfast.   Yes [provider]  olmesartan-hydrochlorothiazide (BENICAR HCT) 40-25 MG tablet Take 1 tablet by mouth daily. 10/15/17  Yes [provider]  pantoprazole (PROTONIX) 40 MG tablet Take 40 mg by mouth daily.  Yes [provider]  potassium chloride SA (K-DUR,KLOR-CON) 20 MEQ tablet Take 20 mEq by mouth daily.   Yes [provider]  albuterol (PROVENTIL HFA;VENTOLIN HFA) 108 (90 Base) MCG/ACT inhaler Inhale 1-2 puffs into the lungs every 6 (six) hours as needed for wheezing or shortness of breath. Patient not taking: Reported on 01/17/2023 12/05/17   Azalia Bilis, MD  cholecalciferol (VITAMIN D3) 25 MCG (1000 UNIT) tablet Take 1,000 Units by mouth daily.    [provider]  Diclofenac Sodium CR 100 MG 24 hr tablet Take 1 tablet (100 mg total) by mouth daily. Patient not taking: Reported on 01/15/2023 06/29/20   Palumbo, April, MD  ferrous sulfate 325 (65 FE) MG EC tablet Take 1 tablet (325 mg total) by mouth daily with breakfast. Patient not taking: Reported on 01/15/2023 12/04/17   Burnadette Pop, MD  lidocaine (LIDODERM) 5 % Place 1 patch onto the skin daily. Remove & Discard patch within 12 hours or as directed by MD Patient not taking: Reported on 01/15/2023 06/29/20   Palumbo, April, MD  pantoprazole (PROTONIX) 40 MG tablet Take 1 tablet (40 mg total) by mouth daily. 12/04/17 01/15/23  Burnadette Pop, MD     Critical care time:      Joneen Roach, AGACNP-BC [facility]  See Amion for personal pager PCCM on call pager [phone] until 7pm. Please call Elink 7p-7a. [phone]  01/23/2023 12:42 PM

## 2023-01-23 NOTE — Progress Notes (Addendum)
PROGRESS NOTE    [name]  UJW:[id number] DOB: 06/02/1929 DOA: 01/17/2023 PCP: Creola Corn, MD    Brief Narrative:   [name] is a 89 y.o. female with past medical history significant for intermittent GI bleed secondary to AVM, GERD, hypothyroidism who presented to [facility] ED on 5/8 with abdominal pain, distention, nausea/vomiting and bright red blood per rectum.  Family called her GI office and was encouraged to go to the ED for further evaluation.  Recently started on MiraLAX for fecal impaction now with with increased frequency of bowel movements.  In the ED, temperature 98.2 F, HR 90, RR 17, SpO2 90% on room air.  WBC 5.5, hemoglobin 11.4, platelet count 276.  Sodium 134, potassium 2.5, chloride 96, CO2 28, glucose 203, BUN 12, creatinine 0.69.  Lipase 34, AST 18, ALT 19, total bilirubin 0.7.  CT abdomen/pelvis with contrast on 5/8 with no acute findings.  GI was consulted.  TRH consulted for admission for further evaluation and management.  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  Recently seen by GI outpatient with fecal impaction and started on MiraLAX.  CT abdomen/pelvis with contrast on admission with no significant finding.  Initially thought to be related to encopresis/overflow from constipation; although rectal mass palpated on DRE by GI.  Patient underwent flexible sigmoidoscopy by Dr. Rhea Belton on 01/20/2023 with findings of partially obstructing tumor in the mid/distal rectum and biopsy was taken.  CT chest with contrast with spiculated nodule right upper lobe.  MR pelvis 5/12 with large polypoid tumor in distal rectum along right wall classified as T3b N1 -- General surgery, medical oncology, GI following; appreciate assistance -- CEA: 3.5 -- Pathology from rectal  biopsy: Moderately differentiated adenocarcinoma -- Norco 1-2 tablets every 4 hours as needed moderate pain -- Morphine 2 mg IV every 3 hours as needed severe pain -- Robaxin 500 mg p.o. every 8 hours as needed muscle spasms -- Regular diet -- Pulmonology to assess pulmonary lesion for possible bronchoscopy/biopsy as recommended by oncology -- General surgery to consider port placement while inpatient, family/patient to discuss -- Further per specialists  Hypokalemia Hypomagnesemia Potassium 3.2, magnesium 1.8 this morning.  Will replete potassium.  Patient complained of burning with IV magnesium and was discontinued.  -- Repeat electrolytes in a.m.  Iron/B12 deficiency anemia Anemia panel with iron 25, TIBC 378, ferritin 5, folate 11.8, vitamin B12 158. -- Received IV iron on 5/9 and 5/10 -- Vitamin B12 1000 mcg p.o. daily  Anxiety -- Lorazepam 1 mg p.o. 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 daily -- TTE: LVEF 60 to 65%, no regional LV wall motion normalities, indeterminate diastolic function, mild AS, normal IVC. -- Continue to monitor on telemetry  History of GIB secondary to AVMs -- Hgb 11.4>>9.6>9.4>9.7; stable -- CBC daily, transfuse for hemoglobin less than 7.0 or active bleeding  Hypothyroidism -- Levothyroxine 125 mcg p.o. daily  GERD -- Protonix 40 mg p.o. daily   DVT prophylaxis: SCDs Start: 01/18/23 0111    Code Status: Full Code Family Communication: Updated patient's sister at bedside, updated patient's son via telephone this morning  Disposition Plan:  Level of care: Med-Surg Status is: Inpatient Remains inpatient appropriate because: Pending further recommendations per  cardiology, GI, general surgery, medical oncology    Consultants:  [facility] General surgery Cardiology Medical  oncology  Procedures:  Flex sigmoidoscopy, Dr. Rhea Belton 5/11 TTE: Pending  Antimicrobials:  None   Subjective: Patient seen examined at bedside, resting comfortably.  Lying in bed.  Sister present at bedside this morning.  Also discussed with patient's son in the room over the telephone. No specific complaints.  General surgery awaiting family determination regarding port placement.  Seen by medical oncology yesterday with recommendation of pulmonology consulted for consideration of bronchoscopy with biopsy regarding right upper lobe spiculated lesion. No other specific questions or concerns at this time.  Discussed RN this morning.  Denies headache, no dizziness, no chest pain, no palpitations, no current abdominal pain, no fever, no nausea/vomiting.  No acute concerns overnight per nurse staff.  Objective: Vitals:   01/22/23 1320 01/22/23 1945 01/23/23 0400 01/23/23 0901  BP: (!) 150/67 (!) 142/64 (!) 152/62   Pulse: 84 76 69   Resp: 14 20 17    Temp: 97.7 F (36.5 C) 97.9 F (36.6 C) 98 F (36.7 C)   TempSrc: Oral     SpO2: 98% 97%  98%  Weight:      Height:        Intake/Output Summary (Last 24 hours) at 01/23/2023 1134 Last data filed at 01/23/2023 0930 Gross per 24 hour  Intake 240 ml  Output --  Net 240 ml    Filed Weights   01/17/23 1914  Weight: 66.7 kg    Examination:  Physical Exam: GEN: NAD, alert and oriented x 3, chronically ill/elderly in appearance HEENT: NCAT, PERRL, EOMI, sclera clear, MMM, poor dentition PULM: CTAB w/o wheezes/crackles, normal respiratory effort, on 1 L nasal cannula with SpO2 96% at rest CV: RRR w/o M/G/R GI: abd soft, NTND, NABS, no R/G/M MSK: no peripheral edema, moves all extremities independently NEURO: No focal neurological deficits PSYCH: normal mood/affect Integumentary: dry/intact, no rashes or wounds    Data Reviewed: I have personally reviewed following labs and imaging studies  CBC: Recent Labs  Lab 01/17/23 1940  01/18/23 0532 01/19/23 0540 01/20/23 0808 01/21/23 0453 01/22/23 0438 01/23/23 0539  WBC 5.5   < > 6.8 5.4 5.3 4.7 4.3  NEUTROABS 3.2  --   --   --   --   --   --   HGB 11.4*   < > 10.4* 9.6* 9.3* 9.7* 9.4*  HCT 34.6*   < > 33.0* 31.3* 29.8* 31.3* 30.4*  MCV 80.5   < > 84.4 87.2 84.4 84.4 86.1  PLT 267   < > 251 219 216 220 221   < > = values in this interval not displayed.   Basic Metabolic Panel: Recent Labs  Lab 01/17/23 1948 01/18/23 0532 01/18/23 1732 01/19/23 0540 01/20/23 1610 01/21/23 0453 01/22/23 0438 01/23/23 0539  NA  --  136  --  139 134* 136 136 137  K  --  2.9*   < > 3.3* 3.1* 3.5 3.2* 4.0  CL  --  103  --  104 100 101 99 101  CO2  --  28  --  29 28 28 29 31   GLUCOSE  --  123*  --  109* 109* 116* 104* 120*  BUN  --  9  --  5* 8 7* 10 12  CREATININE  --  0.62  --  0.63 0.62 0.59 0.55 0.68  CALCIUM  --  8.0*  --  8.2* 8.0* 8.3* 8.3*  8.5*  MG 1.6* 1.6*  --   --   --   --  1.8 1.9  PHOS 3.1 3.2  --   --   --   --   --   --    < > = values in this interval not displayed.   GFR: Estimated Creatinine Clearance: 39 mL/min (by C-G formula based on SCr of 0.68 mg/dL). Liver Function Tests: Recent Labs  Lab 01/17/23 1940 01/18/23 0532  AST 18 14*  ALT 19 16  ALKPHOS 69 50  BILITOT 0.7 0.8  PROT 6.7 5.6*  ALBUMIN 3.5 3.0*   Recent Labs  Lab 01/17/23 1940  LIPASE 34   No results for input(s): "AMMONIA" in the last 168 hours. Coagulation Profile: No results for input(s): "INR", "PROTIME" in the last 168 hours. Cardiac Enzymes: Recent Labs  Lab 01/17/23 1948  CKTOTAL 66   BNP (last 3 results) No results for input(s): "PROBNP" in the last 8760 hours. HbA1C: No results for input(s): "HGBA1C" in the last 72 hours. CBG: Recent Labs  Lab 01/22/23 1646 01/22/23 1946 01/23/23 0009 01/23/23 0449 01/23/23 0754  GLUCAP 169* 152* 136* 103* 109*   Lipid Profile: No results for input(s): "CHOL", "HDL", "LDLCALC", "TRIG", "CHOLHDL", "LDLDIRECT" in  the last 72 hours. Thyroid Function Tests: No results for input(s): "TSH", "T4TOTAL", "FREET4", "T3FREE", "THYROIDAB" in the last 72 hours. Anemia Panel: No results for input(s): "VITAMINB12", "FOLATE", "FERRITIN", "TIBC", "IRON", "RETICCTPCT" in the last 72 hours. Sepsis Labs: Recent Labs  Lab 01/17/23 2236 01/18/23 0532  LATICACIDVEN 0.7 0.9    No results found for this or any previous visit (from the past 240 hour(s)).       Radiology Studies: No results found.      Scheduled Meds:  amiodarone  400 mg Oral Daily   atorvastatin  20 mg Oral Daily   vitamin B-12  1,000 mcg Oral Daily   feeding supplement  1 Container Oral TID BM   feeding supplement  237 mL Oral TID WC   insulin aspart  0-9 Units Subcutaneous Q4H   levothyroxine  125 mcg Oral Q0600   nystatin  5 mL Oral QID   pantoprazole  40 mg Oral Daily   polyethylene glycol  17 g Oral Daily   Continuous Infusions:   LOS: 5 days    Time spent: 50 minutes spent on chart review, discussion with nursing staff, consultants, updating family and interview/physical exam; more than 50% of that time was spent in counseling and/or coordination of care.    Alvira Philips [place], DO [facility] Available via Epic secure chat 7am-7pm After these hours, please refer to coverage provider listed on [email or web address] 01/23/2023, 11:34 AM

## 2023-01-23 NOTE — Progress Notes (Signed)
Physical Therapy Treatment Patient Details Name: [name] MRN: [id number] DOB: 05/12/1929 Today's Date: 01/23/2023   History of Present Illness Patient is 89 y.o. female presented with frequent bowel movements abdominal pain. Son caled GI as patient was developing abdominal pain and distention noted to have abdominal bulge above umbilicus, GI recommended patient going to ED.    PT Comments    General Comments: AxO x 3 very sweet Lady.  Pleasant.  Willing.  Easily fatigues. Her baby Sister in room at time. Assisted OOB to amb in hallway.  General bed mobility comments: Min Assist for upper body/scooting to transfer to EOB then increased Max Assist back to bed due to fatigue. General transfer comment: HHA for 1x sit<>stand from EOB. Min guard for stand to RW from EOB. General Gait Details: Assisted with amb a limited distance of 22 feet with recliner following behind for safety.  Avg HR 94 and RA 92%.  Max c/o fatigue. Pt plans to return home with family support.   Recommendations for follow up therapy are one component of a multi-disciplinary discharge planning process, led by the attending physician.  Recommendations may be updated based on patient status, additional functional criteria and insurance authorization.  Follow Up Recommendations       Assistance Recommended at Discharge Frequent or constant Supervision/Assistance  Patient can return home with the following A little help with walking and/or transfers;A little help with bathing/dressing/bathroom;Assistance with cooking/housework;Direct supervision/assist for medications management;Assist for transportation;Help with stairs or ramp for entrance   Equipment Recommendations  None recommended by PT    Recommendations for Other Services       Precautions / Restrictions Precautions Precautions: Fall Precaution Comments: monitor vitals Restrictions Weight Bearing Restrictions: No     Mobility  Bed Mobility Overal bed  mobility: Needs Assistance Bed Mobility: Supine to Sit, Sit to Supine     Supine to sit: Min assist Sit to supine: Mod assist, Max assist   General bed mobility comments: Min Assist for upper body/scooting to transfer to EOB then increased Max Assist back to bed due to fatigue.    Transfers Overall transfer level: Needs assistance Equipment used: 1 person hand held assist, Rolling walker (2 wheels) Transfers: Sit to/from Stand Sit to Stand: Min guard, Min assist           General transfer comment: HHA for 1x sit<>stand from EOB. Min guard for stand to RW from EOB.    Ambulation/Gait Ambulation/Gait assistance: Min guard, Min assist Gait Distance (Feet): 22 Feet Assistive device: Rolling walker (2 wheels) Gait Pattern/deviations: Step-through pattern, Decreased step length - right, Decreased step length - left, Decreased stride length, Shuffle, Trunk flexed Gait velocity: decr     General Gait Details: Assisted with amb a limited distance of 22 feet with recliner following behind for safety.  Avg HR 94 and RA 92%.  Max c/o fatigue.   Stairs             Wheelchair Mobility    Modified Rankin (Stroke Patients Only)       Balance                                            Cognition Arousal/Alertness: Awake/alert Behavior During Therapy: WFL for tasks assessed/performed Overall Cognitive Status: Within Functional Limits for tasks assessed  General Comments: AxO x 3 very sweet Lady.  Pleasant.  Willing.  Easily fatigues. Her baby Sister in room at time.        Exercises      General Comments        Pertinent Vitals/Pain Pain Assessment Pain Assessment: No/denies pain    Home Living                          Prior Function            PT Goals (current goals can now be found in the care plan section) Progress towards PT goals: Progressing toward goals    Frequency     Min 1X/week      PT Plan Current plan remains appropriate    Co-evaluation              AM-PAC PT "6 Clicks" Mobility   Outcome Measure  Help needed turning from your back to your side while in a flat bed without using bedrails?: A Little Help needed moving from lying on your back to sitting on the side of a flat bed without using bedrails?: A Little Help needed moving to and from a bed to a chair (including a wheelchair)?: A Little Help needed standing up from a chair using your arms (e.g., wheelchair or bedside chair)?: A Little Help needed to walk in hospital room?: A Little Help needed climbing 3-5 steps with a railing? : A Little 6 Click Score: 18    End of Session Equipment Utilized During Treatment: Gait belt Activity Tolerance: Patient limited by fatigue Patient left: with call bell/phone within reach;in chair Nurse Communication: Mobility status PT Visit Diagnosis: Unsteadiness on feet (R26.81);Difficulty in walking, not elsewhere classified (R26.2);Other abnormalities of gait and mobility (R26.89);Muscle weakness (generalized) (M62.81);Pain Pain - Right/Left: Right     Time: 1510-1535 PT Time Calculation (min) (ACUTE ONLY): 25 min  Charges:  $Gait Training: 8-22 mins $Therapeutic Activity: 8-22 mins                     Felecia Shelling  PTA Acute  Rehabilitation Services Office M-F          [phone]

## 2023-01-23 NOTE — Consult Note (Signed)
Consultation Note Date: 01/23/2023   Patient Name: [name]  DOB: 10-17-1928  MRN: [id number]  Age / Sex: 89 y.o., female  PCP: Christina Corn, MD Referring Physician: [place], Eric J, DO  Reason for Consultation:  62 yF w/ new diagnosis rectal cancer with likely pulmonary mets  HPI/Patient Profile: 89 y.o. female  with past medical history of remote leukemia- treated by Dr. Cyndie Chime who she continues to be in touch with, DM, HTN admitted on 01/17/2023 with abdominal pain and hematochezia. Colonoscopy on 5/10 completed with finding of rectal mass. Pathology positive for adenocarcinoma. MRI showed 4cm mass extending through the muscularis propia. There was also finding of 15mm spiculated lung nodule- pulm has been consulted for biopsy. Palliative consulted for goals of care and medical decision making assistance.   Primary Decision Maker PATIENT - if she were unable then she would want her son, [name] to be her Management consultant.   Discussion: I have reviewed medical records including Care Everywhere, progress notes from this and prior admissions, labs and imaging, discussed with RN.  On evaluation patient is awake, alert, oriented.   I introduced Palliative Medicine as specialized medical care for people living with serious illness. It focuses on providing relief from the symptoms and stress of a serious illness. The goal is to improve quality of life for both the patient and the family.  We discussed a brief life review of the patient. She has three children- one son is deceased from cancer, one daughter is in vegetative state in facility in [place], and her other son is currently undergoing treatment for colon cancer.   As far as functional and nutritional status - prior to admission she was doing well aside from intermittent rectal bleeding that she attributed to hemorrhoids. She is independent with all of  her care. Eating well.   We discussed patient's current illness and what it means in the larger context of patient's on-going co-morbidities.    I attempted to elicit values and goals of care important to the patient.  Her primary goals of care currently are "more time". She wants to spend more time with her great grand children. She does not have a baseline of what would not be tolerable to her- she notes her daughter is currently in a facility in vegetative state with feeding tube, and is "still living".  [name] also is a strong advocate for herself and notes she will be able to express when she is at a point that she would not want to continue treatments.  She shared that learning of her diagnosis has been hard emotionally, but she is finding strength in her faith, she believes that God will heal her. She would like another visit from Chaplain for continued support.   [name] understands that chemotherapy is not benign and has seen her son go through difficult times with his own treatments. She is willing to accept that risk and wants to proceed with any treatment offered.   Code status was not discussed as patient expressed that  she did not want to talk about end of life issues due to the significant amount of loss and grief she and her family had recently experienced. However, she did acknowledge that she "probably should" begin thinking about it and discussing this with her family.   Discussed with patient/family the importance of continued conversation with family and the medical providers regarding overall plan of care and treatment options, ensuring decisions are within the context of the patient's values and GOCs.    Questions and concerns were addressed. The family was encouraged to call with questions or concerns.   SUMMARY OF RECOMMENDATIONS -Continue current plan -Patient does not want to have port a cath placed yet- she notes that per her discussion with Oncology she may have oral  chemotherapy and not need port a cath- she would like to first proceed with workup for lung nodule, and have consult with Dr. Truett Perna regarding a possible treatment plan- she is amenable to [facility] as an outpatient if it is needed -No symptom needs at this time- her pain is well controlled with current interventions- she has ongoing anxiety and has been taking xanax since 02/16/1987 when her husband died- avoid discontinuing benzodiazepines abruptly due to risk of withdrawal - she feels this is currently well controlled -PMT will continue to follow while inpatient- will refer for outpatient Palliative at [facility]      Code Status/Advance Care Planning: Full code   Prognosis:   Unable to determine  Discharge Planning: Home with Palliative Services  Primary Diagnoses: Present on Admission:  Hypokalemia  Stroke (HCC)  Acute respiratory failure with hypoxia (HCC)  Symptomatic anemia  Hyperlipidemia  Anxiety  Hypothyroidism  GIB (gastrointestinal bleeding)  Hypomagnesemia  Periumbilical hernia  Malnutrition of moderate degree  Rectal cancer (HCC)  Rectal bleeding   Review of Systems  Constitutional:  Positive for fatigue.  Gastrointestinal:  Positive for blood in stool and rectal pain.    Physical Exam Vitals and nursing note reviewed.  Constitutional:      Appearance: She is not ill-appearing.  Cardiovascular:     Rate and Rhythm: Normal rate.  Pulmonary:     Effort: Pulmonary effort is normal.  Neurological:     Mental Status: She is alert and oriented to person, place, and time.  Psychiatric:        Mood and Affect: Mood normal.        Behavior: Behavior normal.        Thought Content: Thought content normal.        Judgment: Judgment normal.     Vital Signs: BP (!) 146/64 (BP Location: Left Arm)   Pulse 88   Temp 97.8 F (36.6 C) (Oral)   Resp 18   Ht 5' (1.524 m)   Wt 66.7 kg   SpO2 95%   BMI 28.71 kg/m  Pain Scale: 0-10   Pain Score: 0-No pain   SpO2: SpO2: 95  % O2 Device:SpO2: 95 % O2 Flow Rate: .O2 Flow Rate (L/min): 1 L/min  IO: Intake/output summary:  Intake/Output Summary (Last 24 hours) at 01/23/2023 1512 Last data filed at 01/23/2023 1140 Gross per 24 hour  Intake 480 ml  Output --  Net 480 ml    LBM: Last BM Date : 01/23/23 Baseline Weight: Weight: 66.7 kg Most recent weight: Weight: 66.7 kg       Thank you for this consult. Palliative medicine will continue to follow and assist as needed.  Time Total: 100 minutes Greater than 50%  of  this time was spent counseling and coordinating care related to the above assessment and plan.  Signed by: Ocie Bob, AGNP-C Palliative Medicine    Please contact Palliative Medicine Team phone at [phone] for questions and concerns.  For individual provider: See Loretha Stapler

## 2023-01-23 NOTE — Progress Notes (Addendum)
[facility] Progress Note  4 Days Post-Op  Subjective: Feels weak and tired.    Objective: Vital signs in last 24 hours: Temp:  [97.7 F (36.5 C)-98 F (36.7 C)] 98 F (36.7 C) (05/14 0400) Pulse Rate:  [69-84] 69 (05/14 0400) Resp:  [14-20] 17 (05/14 0400) BP: (142-152)/(62-67) 152/62 (05/14 0400) SpO2:  [97 %-98 %] 98 % (05/14 0901) Last BM Date : 01/22/23  Intake/Output from previous day: 05/13 0701 - 05/14 0700 In: 518 [P.O.:518] Out: -  Intake/Output this shift: Total I/O In: 240 [P.O.:240] Out: -   PE: Gen:  Alert, NAD, pleasant Abd: Soft, NT, ND  Lab Results:  Recent Labs    01/22/23 0438 01/23/23 0539  WBC 4.7 4.3  HGB 9.7* 9.4*  HCT 31.3* 30.4*  PLT 220 221   BMET Recent Labs    01/22/23 0438 01/23/23 0539  NA 136 137  K 3.2* 4.0  CL 99 101  CO2 29 31  GLUCOSE 104* 120*  BUN 10 12  CREATININE 0.55 0.68  CALCIUM 8.3* 8.5*   PT/INR No results for input(s): "LABPROT", "INR" in the last 72 hours. CMP     Component Value Date/Time   NA 137 01/23/2023 0539   K 4.0 01/23/2023 0539   CL 101 01/23/2023 0539   CO2 31 01/23/2023 0539   GLUCOSE 120 (H) 01/23/2023 0539   BUN 12 01/23/2023 0539   CREATININE 0.68 01/23/2023 0539   CALCIUM 8.5 (L) 01/23/2023 0539   PROT 5.6 (L) 01/18/2023 0532   ALBUMIN 3.0 (L) 01/18/2023 0532   AST 14 (L) 01/18/2023 0532   ALT 16 01/18/2023 0532   ALKPHOS 50 01/18/2023 0532   BILITOT 0.8 01/18/2023 0532   GFRNONAA >60 01/23/2023 0539   GFRAA >60 12/05/2017 0334   Lipase     Component Value Date/Time   LIPASE 34 01/17/2023 1940       Studies/Results: No results found.  Anti-infectives: Anti-infectives (From admission, onward)    None        Assessment/Plan  89 y/o F who presented with hematochezia, found to have a partially obstructing rectal mass  - AFVSS - CT chest w/ concern for pulmonary metastasis  - pelvic MRI performed stages her disease as T3bN1  - path with  adenocarcinoma - CEA in process  - oncology note reviewed and as of now they are recommending treatment.  The patient would like to follow up with Dr. Truett Perna who is her son's oncologist.   - I have discussed a PAC with the patient, her sister in the room, and her son on the phone.  Everyone is very worried about her weakness and fatigue and undergoing PAC placement.  They would like for her to get stronger before having this procedure done.  I did discuss with them that she may not get much stronger with all she has going on.  They would like to discuss this together and let us know whether she would like to pursue [facility] placement here in the hospital or later as an outpatient.   ADDENDUM: decision was made to hold on [facility] placement until management plan further ironed out after seeing Dr. Truett Perna as an outpatient.  She can then likely have IR place a PAC as an outpatient when they are ready.  We will sign off at this time.  D/w primary service and palliative care service.  FEN: soft ID VTE Foley: none Dispo: med-surg   Paroxysmal a.fib with pauses - cards following, echo  done yesterday shows EF 60-65% with no wall motion abnormalities. Mild AS.  H/o CVA DM 2 -hemoglobin A1c 6.6 Chronic anemia -iron deficiency Hypokalemia Low vitamin B12 Low prealbumin-14 Respiratory insufficiency   LOS: 5 days   I reviewed nursing notes, Consultant GI, onc notes, hospitalist notes, last 24 h vitals and pain scores, last 48 h intake and output, last 24 h labs and trends, and last 24 h imaging results.  Letha Cape, [facility] Surgery Please see Amion for pager number during day hours 7:00am-4:30pm

## 2023-01-23 NOTE — Progress Notes (Signed)
OT Cancellation Note  Patient Details Name: [name] MRN: [id number] DOB: 06/02/1929   Cancelled Treatment:    Reason Eval/Treat Not Completed: Patient declined, no reason specified Patient on phone with son and other family in room. Patient reported she felt too jittery with having just taken medications with nurse. Patient reported she did not want to do therapy to asked to be checked on another time. OT to continue to follow and check back as schedule will allow.  Rosalio Loud, MS Acute Rehabilitation Department Office# [phone]  01/23/2023, 10:22 AM

## 2023-01-24 ENCOUNTER — Inpatient Hospital Stay (HOSPITAL_COMMUNITY): Payer: Medicare PPO

## 2023-01-24 ENCOUNTER — Encounter: Payer: Self-pay | Admitting: *Deleted

## 2023-01-24 ENCOUNTER — Telehealth: Payer: Self-pay | Admitting: Pulmonary Disease

## 2023-01-24 DIAGNOSIS — E876 Hypokalemia: Secondary | ICD-10-CM | POA: Diagnosis not present

## 2023-01-24 DIAGNOSIS — I480 Paroxysmal atrial fibrillation: Secondary | ICD-10-CM | POA: Diagnosis not present

## 2023-01-24 DIAGNOSIS — R911 Solitary pulmonary nodule: Secondary | ICD-10-CM

## 2023-01-24 DIAGNOSIS — I495 Sick sinus syndrome: Secondary | ICD-10-CM | POA: Diagnosis not present

## 2023-01-24 LAB — SURGICAL PATHOLOGY

## 2023-01-24 LAB — GLUCOSE, CAPILLARY
Glucose-Capillary: 104 mg/dL — ABNORMAL HIGH (ref 70–99)
Glucose-Capillary: 138 mg/dL — ABNORMAL HIGH (ref 70–99)
Glucose-Capillary: 144 mg/dL — ABNORMAL HIGH (ref 70–99)
Glucose-Capillary: 145 mg/dL — ABNORMAL HIGH (ref 70–99)
Glucose-Capillary: 157 mg/dL — ABNORMAL HIGH (ref 70–99)
Glucose-Capillary: 85 mg/dL (ref 70–99)

## 2023-01-24 LAB — CEA: CEA: 3.5 ng/mL (ref 0.0–4.7)

## 2023-01-24 MED ORDER — AMLODIPINE BESYLATE 5 MG PO TABS
2.5000 mg | ORAL_TABLET | Freq: Every day | ORAL | Status: DC
  Administered 2023-01-24 – 2023-01-28 (×5): 2.5 mg via ORAL
  Filled 2023-01-24 (×6): qty 1

## 2023-01-24 MED ORDER — FUROSEMIDE 10 MG/ML IJ SOLN
20.0000 mg | Freq: Once | INTRAMUSCULAR | Status: AC
  Administered 2023-01-24: 20 mg via INTRAVENOUS
  Filled 2023-01-24: qty 2

## 2023-01-24 NOTE — Evaluation (Signed)
Occupational Therapy Evaluation Patient Details Name: [name] MRN: [id number] DOB: 1929-04-20 Today's Date: 01/24/2023   History of Present Illness Patient is 89 y.o. female presented with frequent bowel movements abdominal pain. 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 PMH: AVM, GERD, intermittent GI bleed.   Clinical Impression   Patient is a 89 year old female who was admitted for above. Patient was living at home independently PLOF. Currently, patient is noted to need mod A for transfers with patient very quick to fatigue and anxious about prognosis and pending questions in treatments. Patient was noted to have decreased functional activity tolerance, decreased endurance, decreased standing balance, decreased safety awareness, and decreased knowledge of AD/AE impacting participation in ADLs. Patient would continue to benefit from skilled OT services at this time while admitted and after d/c to address noted deficits in order to improve overall safety and independence in ADLs.        Recommendations for follow up therapy are one component of a multi-disciplinary discharge planning process, led by the attending physician.  Recommendations may be updated based on patient status, additional functional criteria and insurance authorization.   Assistance Recommended at Discharge Frequent or constant Supervision/Assistance  Patient can return home with the following A little help with walking and/or transfers;A lot of help with bathing/dressing/bathroom;Assistance with cooking/housework;Direct supervision/assist for medications management;Assist for transportation;Help with stairs or ramp  for entrance;Direct supervision/assist for financial management    Functional Status Assessment  Patient has had a recent decline in their functional status and demonstrates the ability to make significant improvements in function in a reasonable and predictable amount of time.  Equipment Recommendations  None recommended by OT       Precautions / Restrictions Precautions Precautions: Fall Precaution Comments: monitor vitals Restrictions Weight Bearing Restrictions: No      Mobility Bed Mobility               General bed mobility comments: patient was up to [facility] at start of session and transitioned to recliner .             ADL either performed or assessed with clinical judgement   ADL Overall ADL's : Needs assistance/impaired Eating/Feeding: Sitting;Set up   Grooming: Sitting;Minimal assistance   Upper Body Bathing: Sitting;Minimal assistance   Lower Body Bathing: Total assistance;Bed level   Upper Body Dressing : Minimal assistance;Sitting   Lower Body Dressing: Bed level;Total assistance   Toilet Transfer: Moderate assistance;Rolling walker (2 wheels);Stand-pivot Statistician Details (indicate cue type and reason): from [facility] to bed and then bed to recliner with patient noted to have hunched posture standing from bed that was not present getting off the [facility] Toileting- Clothing Manipulation and Hygiene: Total assistance;Sit to/from stand Toileting - Clothing Manipulation Details (indicate cue type and reason): patient reported she was un able to reach her bottom seated with "all these things on my hands" IV posterior L hand and derssing from IV draw on R posterior hand.             Vision   Vision Assessment?: No apparent visual deficits            Pertinent Vitals/Pain Pain Assessment Pain Assessment: No/denies pain     Hand Dominance Right   Extremity/Trunk Assessment Upper Extremity  Assessment Upper Extremity Assessment: Generalized  weakness (ROM WFL)   Lower Extremity Assessment Lower Extremity Assessment: Defer to PT evaluation   Cervical / Trunk Assessment Cervical / Trunk Assessment: Kyphotic   Communication Communication Communication: No difficulties   Cognition Arousal/Alertness: Awake/alert Behavior During Therapy: WFL for tasks assessed/performed Overall Cognitive Status: Within Functional Limits for tasks assessed             General Comments: patient is quick to fatigue. patients sister is in room and eager to help patient with all tasks.                Home Living Family/patient expects to be discharged to:: Private residence Living Arrangements: Children Available Help at Discharge: Family Type of Home: House Home Access: Stairs to enter Secretary/administrator of Steps: 3 Entrance Stairs-Rails: Can reach both Home Layout: One level     Bathroom Shower/Tub: Chief Strategy Officer: Standard Bathroom Accessibility: Yes   Home Equipment: Agricultural consultant (2 wheels);Shower seat;Cane - single point;BSC/3in1;Grab bars - tub/shower;Hand held shower head          Prior Functioning/Environment Prior Level of Function : Independent/Modified Independent              OT Problem List: Decreased activity tolerance;Impaired balance (sitting and/or standing);Decreased coordination;Decreased safety awareness;Decreased knowledge of precautions      OT Treatment/Interventions: Self-care/ADL training;Energy conservation;Therapeutic exercise;DME and/or AE instruction;Therapeutic activities;Patient/family education;Balance training    OT Goals(Current goals can be found in the care plan section) Acute Rehab OT Goals Patient Stated Goal: to get better understanding on problem OT Goal Formulation: With patient/family Time For Goal Achievement: 02/07/23 Potential to Achieve Goals: Fair  OT Frequency: Min 1X/week       AM-PAC OT "6 Clicks" Daily Activity     Outcome Measure Help  from another person eating meals?: A Little Help from another person taking care of personal grooming?: A Little Help from another person toileting, which includes using toliet, bedpan, or urinal?: A Lot Help from another person bathing (including washing, rinsing, drying)?: A Lot Help from another person to put on and taking off regular upper body clothing?: A Little Help from another person to put on and taking off regular lower body clothing?: A Lot 6 Click Score: 15   End of Session Equipment Utilized During Treatment: Other (comment) (BSC) Nurse Communication: Mobility status  Activity Tolerance: Patient tolerated treatment well Patient left: in chair;with family/visitor present;with call bell/phone within reach (Chaplin in room)  OT Visit Diagnosis: Unsteadiness on feet (R26.81);Other abnormalities of gait and mobility (R26.89);History of falling (Z91.81)                Time: 1610-9604 OT Time Calculation (min): 12 min Charges:  OT General Charges $OT Visit: 1 Visit OT Evaluation $OT Eval Low Complexity: 1 Low  Vin Yonke OTR/L, MS Acute Rehabilitation Department Office# [phone]   Selinda Flavin 01/24/2023, 11:21 AM

## 2023-01-24 NOTE — Progress Notes (Signed)
Chaplain engaged in a follow-up visit with [name] and her sister. Chaplain also had followed-up with them yesterday afternoon. Chaplain offered support, presence, and a prayer over them today.     01/24/23 1200  Spiritual Encounters  Type of Visit Follow up  Care provided to: Pt and family  Reason for visit Routine spiritual support  Interventions  Spiritual Care Interventions Made Prayer;Encouragement

## 2023-01-24 NOTE — Telephone Encounter (Signed)
Documented in order - BI to do and will be inpatient.  Will close phone message.

## 2023-01-24 NOTE — Progress Notes (Addendum)
Rounding Note    Patient Name: [name] Date of Encounter: 01/24/2023  [facility] Cardiologist: New (Dr. Elease Hashimoto)  Subjective   No acute overnight events. No recurrent atrial fibrillation since being started on Amiodarone. No chest pain or shortness of breath. Her only real complaint this morning is weakness.   Inpatient Medications    Scheduled Meds:  amiodarone  400 mg Oral Daily   atorvastatin  20 mg Oral Daily   vitamin B-12  1,000 mcg Oral Daily   feeding supplement  237 mL Oral TID WC   furosemide  20 mg Intravenous Once   insulin aspart  0-9 Units Subcutaneous Q4H   levothyroxine  125 mcg Oral Q0600   nystatin  5 mL Oral QID   pantoprazole  40 mg Oral Daily   polyethylene glycol  17 g Oral Daily   Continuous Infusions:  PRN Meds: acetaminophen **OR** acetaminophen, albuterol, ALPRAZolam, amisulpride, guaiFENesin-dextromethorphan, HYDROcodone-acetaminophen, methocarbamol, morphine injection, ondansetron **OR** ondansetron (ZOFRAN) IV, ondansetron (ZOFRAN) IV, sodium phosphate   Vital Signs    Vitals:   01/23/23 0901 01/23/23 1327 01/23/23 1942 01/24/23 0417  BP:  (!) 146/64 (!) 157/67 (!) 131/47  Pulse:  88 83 66  Resp:  18 18 20   Temp:  97.8 F (36.6 C) 98.2 F (36.8 C) 98.2 F (36.8 C)  TempSrc:  Oral Oral   SpO2: 98% 95% 95% 96%  Weight:      Height:        Intake/Output Summary (Last 24 hours) at 01/24/2023 1309 Last data filed at 01/24/2023 0537 Gross per 24 hour  Intake 60 ml  Output --  Net 60 ml      01/17/2023    7:14 PM 01/15/2023   11:26 AM 11/04/2022    7:06 PM  Last 3 Weights  Weight (lbs) 147 lb 147 lb 152 lb  Weight (kg) 66.679 kg 66.679 kg 68.947 kg      Telemetry    Normal sinus rhythm with rates in the 60s to 80s. - Personally Reviewed  ECG    No new ECG tracing today. - Personally Reviewed  Physical Exam   GEN: Elderly Caucasian female in no acute distress.   Neck: No JVD. Cardiac: RRR. No significant  murmurs, rubs or gallops appreciated.  Respiratory: No increased work of breathing. A few scattered rhonchi appreciated but no wheezes or rales (improved from yesterday). GI: Soft, non-distended, and non-tender. MS: No lower extremity edema. No deformity. Skin: Warm and dry. Neuro:  No focal deficits. Psych: Normal affect. Responds appropriately.   Labs    High Sensitivity Troponin:  No results for input(s): "TROPONINIHS" in the last 720 hours.   Chemistry Recent Labs  Lab 01/17/23 1940 01/17/23 1948 01/18/23 0532 01/18/23 1732 01/21/23 0453 01/22/23 0438 01/23/23 0539  NA 134*  --  136   < > 136 136 137  K 2.5*  --  2.9*   < > 3.5 3.2* 4.0  CL 96*  --  103   < > 101 99 101  CO2 28  --  28   < > 28 29 31   GLUCOSE 203*  --  123*   < > 116* 104* 120*  BUN 12  --  9   < > 7* 10 12  CREATININE 0.69  --  0.62   < > 0.59 0.55 0.68  CALCIUM 8.7*  --  8.0*   < > 8.3* 8.3* 8.5*  MG  --    < >  1.6*  --   --  1.8 1.9  PROT 6.7  --  5.6*  --   --   --   --   ALBUMIN 3.5  --  3.0*  --   --   --   --   AST 18  --  14*  --   --   --   --   ALT 19  --  16  --   --   --   --   ALKPHOS 69  --  50  --   --   --   --   BILITOT 0.7  --  0.8  --   --   --   --   GFRNONAA >60  --  >60   < > >60 >60 >60  ANIONGAP 10  --  5   < > 7 8 5    < > = values in this interval not displayed.    Lipids No results for input(s): "CHOL", "TRIG", "HDL", "LABVLDL", "LDLCALC", "CHOLHDL" in the last 168 hours.  Hematology Recent Labs  Lab 01/21/23 0453 01/22/23 0438 01/23/23 0539  WBC 5.3 4.7 4.3  RBC 3.53* 3.71* 3.53*  HGB 9.3* 9.7* 9.4*  HCT 29.8* 31.3* 30.4*  MCV 84.4 84.4 86.1  MCH 26.3 26.1 26.6  MCHC 31.2 31.0 30.9  RDW 14.2 14.2 14.3  PLT 216 220 221   Thyroid  Recent Labs  Lab 01/17/23 2230  TSH 0.831    BNPNo results for input(s): "BNP", "PROBNP" in the last 168 hours.  DDimer No results for input(s): "DDIMER" in the last 168 hours.   Radiology    DG CHEST PORT 1 VIEW  Result  Date: 01/24/2023 CLINICAL DATA:  Shortness of breath EXAM: PORTABLE CHEST 1 VIEW COMPARISON:  01/17/2023 FINDINGS: Cardiac shadow is stable. Aortic calcifications are again seen. Lungs are well aerated bilaterally. Mild interstitial changes are noted which may be related to edema. No focal infiltrate or effusion is seen. IMPRESSION: Suggestion of mild interstitial edema. Electronically Signed   By: Alcide Clever M.D.   On: 01/24/2023 10:51    Cardiac Studies   Echocardiogram 01/21/2023: Impressions: 1. Left ventricular ejection fraction, by estimation, is 60 to 65%. The  left ventricle has normal function. The left ventricle has no regional  wall motion abnormalities. Left ventricular diastolic parameters are  indeterminate.   2. Right ventricular systolic function is normal. The right ventricular  size is normal. There is normal pulmonary artery systolic pressure.   3. The mitral valve is grossly normal. Mild mitral valve regurgitation.   4. The aortic valve is calcified. Aortic valve regurgitation is not  visualized. Mild aortic valve stenosis.   5. The inferior vena cava is normal in size with greater than 50%  respiratory variability, suggesting right atrial pressure of 3 mmHg.   Patient Profile     89 y.o. female with a history of hypertension, hyperlipidemia, type 2 diabetes mellitus, hypothyroidism, stroke, anemia, and leukemia in remission who presented on 01/17/2023 with abdominal pain/ distension with associated nausea/ vomiting and intermittent rectal bleeding. Work-up revealed a rectal mass and biopsies showed a moderately differentiated adenocarcinoma. CT showed a solitary spiculated nodule in the right lung concerning for a metastatic lesion. Cardiology was consulted on 5/11/204 for evaluation of prolonged sinus pauses. Per review of telemetry, patient was noted to have new onset atrial fibrillation with post-conversion pauses (the longest one being 10 seconds).   Assessment & Plan     New Onset  Atrial Fibrillation Tachy-Brady Syndrome with Post-Conversion Pauses Patient was admitted with GI symptoms and ultimately diagnosed with rectal cancer. She was noted to have new onset atrial fibrillation with post-conversion pauses (the longest being 10 seconds). Echo showed normal LV function. Patient noted to have hypokalemia and hypomagnesemia on arrival with potassium as low as 2.9 and magnesium as low as 1.6 but both have been repleted. TSH normal. She was started on Amiodarone on 5/13 to help suppress atrial fibrillation and prevent post-conversion pauses. - Maintaining sinus rhythm. No recurrent atrial fibrillation since starting Amiodarone. - Continue Amiodarone 400mg  daily for 2 weeks and then reduced to 200mg  daily. - Continue to avoid all AV nodal agents.  - CHA2DS2-VASc = 8 (coronary artery calcifications on CT, HTN, DM, stroke x2, age x2, female). Currently not on any anticoagulation. Plan is to start Eliquis once it is clear that there are no plans for invasive procedures. Sounds like she is going to need a bronchoscopy/ biopsy and then placement of Port-A-Cath (Pulmonology said it would be OK to do bronchoscopy as an outpatient but patient wants to do it while here). When all procedures are completed, would start Eliquis 5mg  twice daily.   Hypertension BP elevated at times.  - Will restart home Amlodipine 2.5mg  daily.   Hypokalemia Hypomagnesemia Potassium 2.9 and magnesium 1.6 on arrival. Supplemented and normalized. - Potassium 4.0 and Magnesium 1.9 yesterday. Today's labs pending.     Rectal Adenocarcinoma Diagnosed this admission. Chest CT showed one solitary spiculated pulmonary nodule (possible metastasis). She has been seen by Oncology. Treatment not completely finalized at this time but sounds like she will likely be getting chemotherapy (will need a Port-A-Cath placed) +/- radiation.  - Management per Oncology.   Otherwise, per primary team: -  Hyperlipidemia - Type 2 diabetes mellitus - Hypothyroidism - Chronic anemia - History of GI bleeds secondary to AVMs - GERD - Anxiety  For questions or updates, please contact [facility] Please consult [email or web address] for contact info under        Signed, Corrin Parker, PA-C  01/24/2023, 1:09 PM    I have seen and examined the patient along with Corrin Parker, PA-C .  I have reviewed the chart, notes and new data.  I agree with PA/NP's note.  Key new complaints: No cardiovascular complaints at this time.  Remains unaware of palpitations.  Note plans for robotic assisted navigational bronchoscopy 01/30/2023. Key examination changes: Regular rate and rhythm. Key new findings / data: Electrolytes are now normal.  Remarkable abolition of arrhythmia over the last 48 hours.  No atrial fibrillation is seen and therefore no postconversion pauses.  She has not had any problems with bradycardia.  PLAN: [facility] will sign off.   Medication Recommendations:   Amiodarone 400 mg daily for another 2 weeks, then amiodarone 200 mg daily When appropriate, start oral anticoagulation with Eliquis 5 mg twice daily (1 there is no plan for additional invasive procedures such as biopsy or placement of Port-A-Cath).  Other recommendations (labs, testing, etc): Per primary team Follow up as an outpatient: Will make arrangements for outpatient follow-up in roughly 1 month.   Thurmon Fair, MD, [facility] [phone] 01/24/2023, 2:39 PM

## 2023-01-24 NOTE — Progress Notes (Signed)
PCCM Update:  Patient scheduled for bronchoscopy 5/21 at 3:15pm. She will remain inpatient until procedure is completed.   Melody Comas, MD [facility] Office: [phone]   See Amion for personal pager PCCM on call pager [phone] until 7pm. Please call Elink 7p-7a. [phone]

## 2023-01-24 NOTE — Progress Notes (Signed)
I saw [name] this morning.  She seem to be doing okay.  She was having some nausea and a headache.  This was a bit better when I saw her.  She had no emesis.  She has had no bleeding.  She has had no complaints of abdominal pain.  She does not want to have a Port-A-Cath in right now.  I am certainly okay with this.  I appreciate Pulmonary Medicine seeing her.  A bronchoscopy is set up for May 21.  She has had no rashes.  There has been no cough or shortness of breath.  Her CEA levels are 3.5.  Have I have a hard time believing that she has a metastasis to her lung.  I have anything, I would think that this lung nodule is a primary bronchogenic carcinoma.  As far as systemic therapy goes for her rectal cancer, I still think that she would be a decent candidate for Xeloda/oxaliplatin.  I suppose we could always start this as an outpatient.  We would have to make some serious dosage adjustments given her age and overall performance status.  She really needs to have some lab work done.  I will make sure she has lab work done tomorrow.  She was iron deficient.  I do not  know if she had any IV iron or not.  Again we will recheck her iron studies.   Her vital signs are all stable also if other blood pressure is 171/67.  Overall, there really is no change in her physical exam.  Right now, the really is not much for [place] to do.  Again, you really would be nice to get a PET scan but this can only be done as an outpatient.  Again I had to believe that what is in her lung is not from rectal cancer.  I do appreciate the great care she is getting from everybody on 5 E.  [name], MD  Philippians 4:6-7

## 2023-01-24 NOTE — Progress Notes (Signed)
Oncology Discharge Planning Note  [facility] Address: [address], [place], [place] [address] Hours of Operation:  Lewayne Bunting, Monday - Friday  Clinic Contact Information:  [phone]) [phone]  Oncology Care Team: Medical Oncologist:  Truett Perna  Patient Details: Name:  [name], [name] MRN:   [id number] DOB:   1929-06-13 Reason for Current Admission: @PPROB @  Discharge Planning Narrative: Notification of admission received by inpatient team for Israa Kreager.  Discharge follow-up appointments for oncology are current and available on the AVS and MyChart.   Upon discharge from the hospital, hematology/oncology's post discharge plan of care for the outpatient setting is: Feb 07, 2023 at [facility] [address] [place], [place] [address]  [phone]     Nadyne Boursiquot will be called within two business days after discharge to review hematology/oncology's plan of care for full understanding.    Outpatient Oncology Specific Care Only: Oncology appointment transportation needs addressed?:  no Oncology medication management for symptom management addressed?:  no Chemo Alert Card reviewed?:  no Immunotherapy Alert Card reviewed?:  no

## 2023-01-24 NOTE — Telephone Encounter (Signed)
Bronchoscopy order request placed.  Thanks, JD

## 2023-01-24 NOTE — Progress Notes (Addendum)
PROGRESS NOTE    [name]  WUJ:[id number] DOB: 1928/12/18 DOA: 01/17/2023 PCP: Creola Corn, MD    Brief Narrative:   [name] is a 89 y.o. female with past medical history significant for intermittent GI bleed secondary to AVM, GERD, hypothyroidism who presented to [facility] ED on 5/8 with abdominal pain, distention, nausea/vomiting and bright red blood per rectum.  Family called her GI office and was encouraged to go to the ED for further evaluation.  Recently started on MiraLAX for fecal impaction now with with increased frequency of bowel movements.  In the ED, temperature 98.2 F, HR 90, RR 17, SpO2 90% on room air.  WBC 5.5, hemoglobin 11.4, platelet count 276.  Sodium 134, potassium 2.5, chloride 96, CO2 28, glucose 203, BUN 12, creatinine 0.69.  Lipase 34, AST 18, ALT 19, total bilirubin 0.7.  CT abdomen/pelvis with contrast on 5/8 with no acute findings.  GI was consulted.  TRH consulted for admission for further evaluation and management.  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  Recently seen by GI outpatient with fecal impaction and started on MiraLAX.  CT abdomen/pelvis with contrast on admission with no significant finding.  Initially thought to be related to encopresis/overflow from constipation; although rectal mass palpated on DRE by GI.  Patient underwent flexible sigmoidoscopy by Dr. Rhea Belton on 01/20/2023 with findings of partially obstructing tumor in the mid/distal rectum and biopsy was taken.  CT chest with contrast with spiculated nodule right upper lobe.  MR pelvis 5/12 with large polypoid tumor in distal rectum along right wall classified as T3b N1.  GI and general surgery now signed off.  Medical oncology plans outpatient follow-up scheduled on Feb 07, 2023 with  Dr. Truett Perna.  Patient and family deferring port placement outpatient currently. -- General surgery, medical oncology, GI following; appreciate assistance -- CEA: 3.5 -- Pathology from rectal biopsy: Moderately differentiated adenocarcinoma -- Norco 1-2 tablets every 4 hours as needed moderate pain -- Morphine 2 mg IV every 3 hours as needed severe pain -- Robaxin 500 mg p.o. every 8 hours as needed muscle spasms -- Regular diet -- Pulmonology following for consideration of bronchoscopy/biopsy of pulmonary   Acute respiratory failure with hypoxia, not POA Repeat chest x-ray this morning with concern of mild interstitial edema; no consolidation, afebrile. -- Lasix 10 mg IV x 1 -- Continue supplemental oxygen, maintain SpO2 greater than 92%, attempt to wean off later today  Hypokalemia Hypomagnesemia Repleted during hospitalization.  Iron/B12 deficiency anemia Anemia panel with iron 25, TIBC 378, ferritin 5, folate 11.8, vitamin B12 158. -- Received IV iron on 5/9 and 5/10 -- Vitamin B12 1000 mcg p.o. daily  Anxiety -- Lorazepam 1 mg p.o. 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 daily x 2 weeks then reduce to 200 mg p.o. daily -- TTE: LVEF 60 to 65%, no regional LV wall motion normalities, indeterminate diastolic function, mild AS, normal IVC. -- Will need monitoring of LFTs, TFTs every 6 months, eye exam yearly -- Continue to monitor on telemetry  History of GIB secondary to AVMs -- Hgb 11.4>>9.6>9.4>9.7>9.4; stable -- CBC daily, transfuse for hemoglobin less than 7.0 or active bleeding  Hypothyroidism -- Levothyroxine 125 mcg p.o. daily  GERD -- Protonix 40 mg p.o. daily   DVT prophylaxis: SCDs Start: 01/18/23 0111    Code Status: Full Code Family Communication: Updated patient's sister at bedside this morning  Disposition Plan:   Level of care: Med-Surg Status is: Inpatient Remains inpatient appropriate because: Pending possible bronchoscopy with biopsy of pulmonary lesion while inpatient per pulmonology, otherwise anticipate discharge home soon with outpatient follow-up with GI/general surgery/oncology    Consultants:  [facility]: Signed off General surgery: Signed off Cardiology Medical oncology: Signed off with outpatient follow-up scheduled Neurology  Procedures:  Flex sigmoidoscopy, Dr. Rhea Belton 5/11 TTE:   Antimicrobials:  None   Subjective: Patient seen examined at bedside, resting comfortably.  Lying in bed.  Eating breakfast.  Sister present.  No specific complaints this morning.  Seen by pulmonology yesterday for consideration of bronchoscopy with biopsy of pulmonary lesion while she is inpatient, they are currently trying to coordinate timing.  GI, general surgery, medical oncology now signed off with plan outpatient follow-up.  Discussed RN this morning.  Denies headache, no dizziness, no chest pain, no palpitations, no current abdominal pain, no fever, no nausea/vomiting.  No acute concerns overnight per nurse staff.  Objective: Vitals:   01/23/23 0901 01/23/23 1327 01/23/23 1942 01/24/23 0417  BP:  (!) 146/64 (!) 157/67 (!) 131/47  Pulse:  88 83 66  Resp:  18 18 20   Temp:  97.8 F (36.6 C) 98.2 F (36.8 C) 98.2 F (36.8 C)  TempSrc:  Oral Oral   SpO2: 98% 95% 95% 96%  Weight:      Height:        Intake/Output Summary (Last 24 hours) at 01/24/2023 1053 Last data filed at 01/24/2023 0537 Gross per 24 hour  Intake 300 ml  Output --  Net 300 ml    Filed Weights   01/17/23 1914  Weight: 66.7 kg    Examination:  Physical Exam: GEN: NAD, alert and oriented x 3, chronically ill/elderly in appearance HEENT: NCAT, PERRL, EOMI, sclera clear, MMM, poor dentition PULM: CTAB w/o wheezes/crackles, normal respiratory effort, on 2 L nasal cannula with SpO2 96% at rest CV: RRR  w/o M/G/R GI: abd soft, NTND, NABS, no R/G/M MSK: no peripheral edema, moves all extremities independently NEURO: No focal neurological deficits PSYCH: normal mood/affect Integumentary: dry/intact, no rashes or wounds    Data Reviewed: I have personally reviewed following labs and imaging studies  CBC: Recent Labs  Lab 01/17/23 1940 01/18/23 0532 01/19/23 0540 01/20/23 0808 01/21/23 0453 01/22/23 0438 01/23/23 0539  WBC 5.5   < > 6.8 5.4 5.3 4.7 4.3  NEUTROABS 3.2  --   --   --   --   --   --   HGB 11.4*   < > 10.4* 9.6* 9.3* 9.7* 9.4*  HCT 34.6*   < > 33.0* 31.3* 29.8* 31.3* 30.4*  MCV 80.5   < > 84.4 87.2 84.4 84.4 86.1  PLT 267   < > 251 219 216 220 221   < > = values in this interval not displayed.   Basic Metabolic Panel: Recent Labs  Lab 01/17/23 1948 01/18/23 0532 01/18/23 1732 01/19/23 0540 01/20/23 0808 01/21/23 0453 01/22/23 0438 01/23/23 0539  NA  --  136  --  139 134* 136 136 137  K  --  2.9*   < > 3.3* 3.1* 3.5 3.2* 4.0  CL  --  103  --  104 100 101 99 101  CO2  --  28  --  29 28 28 29 31   GLUCOSE  --  123*  --  109* 109* 116* 104* 120*  BUN  --  9  --  5* 8 7* 10 12  CREATININE  --  0.62  --  0.63 0.62 0.59 0.55 0.68  CALCIUM  --  8.0*  --  8.2* 8.0* 8.3* 8.3* 8.5*  MG 1.6* 1.6*  --   --   --   --  1.8 1.9  PHOS 3.1 3.2  --   --   --   --   --   --    < > = values in this interval not displayed.   GFR: Estimated Creatinine Clearance: 39 mL/min (by C-G formula based on SCr of 0.68 mg/dL). Liver Function Tests: Recent Labs  Lab 01/17/23 1940 01/18/23 0532  AST 18 14*  ALT 19 16  ALKPHOS 69 50  BILITOT 0.7 0.8  PROT 6.7 5.6*  ALBUMIN 3.5 3.0*   Recent Labs  Lab 01/17/23 1940  LIPASE 34   No results for input(s): "AMMONIA" in the last 168 hours. Coagulation Profile: No results for input(s): "INR", "PROTIME" in the last 168 hours. Cardiac Enzymes: Recent Labs  Lab 01/17/23 1948  CKTOTAL 66   BNP (last 3 results) No results for  input(s): "PROBNP" in the last 8760 hours. HbA1C: No results for input(s): "HGBA1C" in the last 72 hours. CBG: Recent Labs  Lab 01/23/23 1620 01/23/23 1939 01/23/23 2325 01/24/23 0414 01/24/23 0744  GLUCAP 145* 138* 116* 85 104*   Lipid Profile: No results for input(s): "CHOL", "HDL", "LDLCALC", "TRIG", "CHOLHDL", "LDLDIRECT" in the last 72 hours. Thyroid Function Tests: No results for input(s): "TSH", "T4TOTAL", "FREET4", "T3FREE", "THYROIDAB" in the last 72 hours. Anemia Panel: No results for input(s): "VITAMINB12", "FOLATE", "FERRITIN", "TIBC", "IRON", "RETICCTPCT" in the last 72 hours. Sepsis Labs: Recent Labs  Lab 01/17/23 2236 01/18/23 0532  LATICACIDVEN 0.7 0.9    No results found for this or any previous visit (from the past 240 hour(s)).       Radiology Studies: No results found.      Scheduled Meds:  amiodarone  400 mg Oral Daily   atorvastatin  20 mg Oral Daily   vitamin B-12  1,000 mcg Oral Daily   feeding supplement  237 mL Oral TID WC   insulin aspart  0-9 Units Subcutaneous Q4H   levothyroxine  125 mcg Oral Q0600   nystatin  5 mL Oral QID   pantoprazole  40 mg Oral Daily   polyethylene glycol  17 g Oral Daily   Continuous Infusions:   LOS: 6 days    Time spent: 50 minutes spent on chart review, discussion with nursing staff, consultants, updating family and interview/physical exam; more than 50% of that time was spent in counseling and/or coordination of care.    Alvira Philips [place], DO [facility] Available via Epic secure chat 7am-7pm After these hours, please refer to coverage provider listed on [email or web address] 01/24/2023, 10:53 AM

## 2023-01-24 NOTE — Progress Notes (Signed)
Palliative-   Chart reviewed- noted bronch biopsy schedule for 5/21. Pt to remain inpt until procedure.  Attempted to see patient- she was receiving personal care.  Will try again tomorrow.   Ocie Bob, AGNP-C Palliative Medicine  No charge

## 2023-01-25 ENCOUNTER — Encounter: Payer: Self-pay | Admitting: *Deleted

## 2023-01-25 ENCOUNTER — Other Ambulatory Visit: Payer: Self-pay

## 2023-01-25 DIAGNOSIS — E876 Hypokalemia: Secondary | ICD-10-CM | POA: Diagnosis not present

## 2023-01-25 LAB — COMPREHENSIVE METABOLIC PANEL
ALT: 30 U/L (ref 0–44)
AST: 20 U/L (ref 15–41)
Albumin: 3.2 g/dL — ABNORMAL LOW (ref 3.5–5.0)
Alkaline Phosphatase: 66 U/L (ref 38–126)
Anion gap: 8 (ref 5–15)
BUN: 16 mg/dL (ref 8–23)
CO2: 30 mmol/L (ref 22–32)
Calcium: 8.7 mg/dL — ABNORMAL LOW (ref 8.9–10.3)
Chloride: 94 mmol/L — ABNORMAL LOW (ref 98–111)
Creatinine, Ser: 0.75 mg/dL (ref 0.44–1.00)
GFR, Estimated: >60 mL/min (ref >60)
Glucose, Bld: 113 mg/dL — ABNORMAL HIGH (ref 70–99)
Potassium: 3.5 mmol/L (ref 3.5–5.1)
Sodium: 132 mmol/L — ABNORMAL LOW (ref 135–145)
Total Bilirubin: 0.7 mg/dL (ref 0.3–1.2)
Total Protein: 6.5 g/dL (ref 6.5–8.1)

## 2023-01-25 LAB — IRON AND TIBC
Iron: 49 ug/dL (ref 28–170)
Saturation Ratios: 14 % (ref 10.4–31.8)
TIBC: 356 ug/dL (ref 250–450)
UIBC: 307 ug/dL

## 2023-01-25 LAB — CBC
HCT: 33.4 % — ABNORMAL LOW (ref 36.0–46.0)
Hemoglobin: 10.4 g/dL — ABNORMAL LOW (ref 12.0–15.0)
MCH: 26.4 pg (ref 26.0–34.0)
MCHC: 31.1 g/dL (ref 30.0–36.0)
MCV: 84.8 fL (ref 80.0–100.0)
Platelets: 260 10*3/uL (ref 150–400)
RBC: 3.94 MIL/uL (ref 3.87–5.11)
RDW: 14.6 % (ref 11.5–15.5)
WBC: 5.9 10*3/uL (ref 4.0–10.5)
nRBC: 0 % (ref 0.0–0.2)

## 2023-01-25 LAB — GLUCOSE, CAPILLARY
Glucose-Capillary: 111 mg/dL — ABNORMAL HIGH (ref 70–99)
Glucose-Capillary: 120 mg/dL — ABNORMAL HIGH (ref 70–99)
Glucose-Capillary: 267 mg/dL — ABNORMAL HIGH (ref 70–99)
Glucose-Capillary: 125 mg/dL — ABNORMAL HIGH (ref 70–99)
Glucose-Capillary: 163 mg/dL — ABNORMAL HIGH (ref 70–99)

## 2023-01-25 LAB — MAGNESIUM: Magnesium: 1.7 mg/dL (ref 1.7–2.4)

## 2023-01-25 MED ORDER — INSULIN ASPART 100 UNIT/ML IJ SOLN
0.0000 [IU] | Freq: Three times a day (TID) | INTRAMUSCULAR | Status: DC
  Administered 2023-01-25: 5 [IU] via SUBCUTANEOUS
  Administered 2023-01-25: 1 [IU] via SUBCUTANEOUS
  Administered 2023-01-26: 2 [IU] via SUBCUTANEOUS
  Administered 2023-01-27 – 2023-01-28 (×2): 1 [IU] via SUBCUTANEOUS

## 2023-01-25 MED ORDER — ENOXAPARIN SODIUM 40 MG/0.4ML IJ SOSY
40.0000 mg | PREFILLED_SYRINGE | INTRAMUSCULAR | Status: DC
  Administered 2023-01-26 – 2023-01-28 (×3): 40 mg via SUBCUTANEOUS
  Filled 2023-01-25 (×4): qty 0.4

## 2023-01-25 MED ORDER — ENOXAPARIN SODIUM 30 MG/0.3ML IJ SOSY
30.0000 mg | PREFILLED_SYRINGE | INTRAMUSCULAR | Status: DC
  Administered 2023-01-25: 30 mg via SUBCUTANEOUS
  Filled 2023-01-25: qty 0.3

## 2023-01-25 MED ORDER — FLUTICASONE PROPIONATE 50 MCG/ACT NA SUSP
2.0000 | Freq: Every day | NASAL | Status: DC
  Administered 2023-01-25 – 2023-01-28 (×4): 2 via NASAL
  Filled 2023-01-25: qty 16

## 2023-01-25 MED ORDER — LORATADINE 10 MG PO TABS
10.0000 mg | ORAL_TABLET | Freq: Every day | ORAL | Status: DC
  Administered 2023-01-25 – 2023-01-28 (×4): 10 mg via ORAL
  Filled 2023-01-25 (×5): qty 1

## 2023-01-25 MED ORDER — SODIUM CHLORIDE 0.9 % IV SOLN
510.0000 mg | Freq: Once | INTRAVENOUS | Status: AC
  Administered 2023-01-25: 510 mg via INTRAVENOUS
  Filled 2023-01-25: qty 510

## 2023-01-25 NOTE — Progress Notes (Signed)
PROGRESS NOTE    [name]  ZHY:[id number] DOB: [birth year] DOA: 01/17/2023 PCP: Creola Corn, MD    Brief Narrative:   [name] is a 89 y.o. female with past medical history significant for intermittent GI bleed secondary to AVM, GERD, hypothyroidism who presented to [facility] ED on 5/8 with abdominal pain, distention, nausea/vomiting and bright red blood per rectum.  Family called her GI office and was encouraged to go to the ED for further evaluation.  Recently started on MiraLAX for fecal impaction now with with increased frequency of bowel movements.  In the ED, temperature 98.2 F, HR 90, RR 17, SpO2 90% on room air.  WBC 5.5, hemoglobin 11.4, platelet count 276.  Sodium 134, potassium 2.5, chloride 96, CO2 28, glucose 203, BUN 12, creatinine 0.69.  Lipase 34, AST 18, ALT 19, total bilirubin 0.7.  CT abdomen/pelvis with contrast on 5/8 with no acute findings.  GI was consulted.  TRH consulted for admission for further evaluation and management.  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  Recently seen by GI outpatient with fecal impaction and started on MiraLAX.  CT abdomen/pelvis with contrast on admission with no significant finding.  Initially thought to be related to encopresis/overflow from constipation; although rectal mass palpated on DRE by GI.  Patient underwent flexible sigmoidoscopy by Dr. Rhea Belton on 01/20/2023 with findings of partially obstructing tumor in the mid/distal rectum and biopsy was taken.  CT chest with contrast with spiculated nodule right upper lobe.  MR pelvis 5/12 with large polypoid tumor in distal rectum along right wall classified as T3b N1.  GI and general surgery now signed off.  Medical oncology plans outpatient follow-up scheduled on Feb 07, 2023 with  Dr. Truett Perna.  Patient and family deferring port placement outpatient currently. -- General surgery, medical oncology, GI following; appreciate assistance -- CEA: 3.5 -- Pathology from rectal biopsy: Moderately differentiated adenocarcinoma -- Norco 1-2 tablets every 4 hours as needed moderate pain -- Morphine 2 mg IV every 3 hours as needed severe pain -- Robaxin 500 mg p.o. every 8 hours as needed muscle spasms -- Regular diet -- Pulmonology following for planned bronchoscopy/biopsy of pulmonary nodule scheduled on 01/30/2023  Acute respiratory failure with hypoxia, not POA Repeat chest x-ray this morning with concern of mild interstitial edema; no consolidation, afebrile.  Received IV Lasix on 5/15. -- Continue supplemental oxygen, maintain SpO2 greater than 92% -- Repeat chest x-ray in the a.m. -- Continue to encourage incentive spirometer use  Hypokalemia Hypomagnesemia Repleted during hospitalization.  Iron/B12 deficiency anemia Anemia panel with iron 25, TIBC 378, ferritin 5, folate 11.8, vitamin B12 158.  Repeat anemia panel with iron 49, TIBC 356 on 5/16 -- Received IV iron on 5/9 and 5/10 -- Vitamin B12 1000 mcg p.o. daily  Anxiety -- Lorazepam 1 mg p.o. 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 daily x 2 weeks then reduce to 200 mg p.o. daily -- TTE: LVEF 60 to 65%, no regional LV wall motion normalities, indeterminate diastolic function, mild AS, normal IVC. -- Will need monitoring of LFTs, TFTs every 6 months, eye exam yearly -- Continue to monitor on telemetry  History of GIB secondary to AVMs -- Hgb 11.4>>9.6>9.4>9.7>9.4; stable --  CBC daily, transfuse for hemoglobin less than 7.0 or active bleeding  Hypothyroidism -- Levothyroxine 125 mcg p.o. daily  GERD -- Protonix 40 mg p.o. daily   DVT prophylaxis: enoxaparin  (LOVENOX) injection 30 mg Start: 01/25/23 0800 SCDs Start: 01/18/23 0111    Code Status: Full Code Family Communication: Updated patient's sister at bedside this morning  Disposition Plan:  Level of care: Med-Surg Status is: Inpatient Remains inpatient appropriate because: Pending bronchoscopy with biopsy of pulmonary lesion while inpatient per pulmonology, otherwise anticipate discharge home soon with outpatient follow-up with GI/general surgery/oncology    Consultants:  [place] [facility]: Signed off General surgery: Signed off Cardiology Medical oncology: Signed off with outpatient follow-up scheduled Pulmonology  Procedures:  Flex sigmoidoscopy, Dr. Rhea Belton 5/11 TTE:   Antimicrobials:  None   Subjective: Patient seen examined at bedside, resting comfortably.  Lying in bed.  Sleeping but easily arousable.  Sister present at bedside.  Pulmonology plans bronchoscopy with biopsy on 01/30/2023, to remain inpatient until after procedure.  Seen by oncology this morning, Dr. Myna Hidalgo.  GI, general surgery, now signed off with plan outpatient follow-up.  Discussed RN this morning.  Denies headache, no dizziness, no chest pain, no palpitations, no current abdominal pain, no fever, no nausea/vomiting.  No acute concerns overnight per nurse staff.  Objective: Vitals:   01/24/23 0417 01/24/23 1310 01/24/23 1928 01/25/23 0443  BP: (!) 131/47 (!) 171/67 (!) 159/61 (!) 159/75  Pulse: 66 73 73 89  Resp: 20 16 16 15   Temp: 98.2 F (36.8 C) 97.6 F (36.4 C) 97.8 F (36.6 C) 98.2 F (36.8 C)  TempSrc:  Oral Oral   SpO2: 96% 97% 97% 95%  Weight:      Height:       No intake or output data in the 24 hours ending 01/25/23 1115   Filed Weights   01/17/23 1914  Weight: 66.7 kg    Examination:  Physical Exam: GEN: NAD, alert and oriented x 3, chronically ill/elderly in appearance HEENT: NCAT, PERRL, EOMI, sclera clear, MMM, poor dentition PULM: CTAB w/o wheezes/crackles,  normal respiratory effort, on 2 L nasal cannula with SpO2 95% at rest CV: RRR w/o M/G/R GI: abd soft, NTND, NABS, no R/G/M MSK: no peripheral edema, moves all extremities independently NEURO: No focal neurological deficits PSYCH: normal mood/affect Integumentary: dry/intact, no rashes or wounds    Data Reviewed: I have personally reviewed following labs and imaging studies  CBC: Recent Labs  Lab 01/20/23 0808 01/21/23 0453 01/22/23 0438 01/23/23 0539 01/25/23 0508  WBC 5.4 5.3 4.7 4.3 5.9  HGB 9.6* 9.3* 9.7* 9.4* 10.4*  HCT 31.3* 29.8* 31.3* 30.4* 33.4*  MCV 87.2 84.4 84.4 86.1 84.8  PLT 219 216 220 221 260   Basic Metabolic Panel: Recent Labs  Lab 01/20/23 0808 01/21/23 0453 01/22/23 0438 01/23/23 0539 01/25/23 0508  NA 134* 136 136 137 132*  K 3.1* 3.5 3.2* 4.0 3.5  CL 100 101 99 101 94*  CO2 28 28 29 31 30   GLUCOSE 109* 116* 104* 120* 113*  BUN 8 7* 10 12 16   CREATININE 0.62 0.59 0.55 0.68 0.75  CALCIUM 8.0* 8.3* 8.3* 8.5* 8.7*  MG  --   --  1.8 1.9 1.7   GFR: Estimated Creatinine Clearance: 39 mL/min (by C-G formula based on SCr of 0.75 mg/dL). Liver Function Tests: Recent Labs  Lab 01/25/23 0508  AST 20  ALT 30  ALKPHOS 66  BILITOT 0.7  PROT 6.5  ALBUMIN 3.2*  No results for input(s): "LIPASE", "AMYLASE" in the last 168 hours.  No results for input(s): "AMMONIA" in the last 168 hours. Coagulation Profile: No results for input(s): "INR", "PROTIME" in the last 168 hours. Cardiac Enzymes: No results for input(s): "CKTOTAL", "CKMB", "CKMBINDEX", "TROPONINI" in the last 168 hours.  BNP (last 3 results) No results for input(s): "PROBNP" in the last 8760 hours. HbA1C: No results for input(s): "HGBA1C" in the last 72 hours. CBG: Recent Labs  Lab 01/24/23 1612 01/24/23 1925 01/24/23 2338 01/25/23 0440 01/25/23 0719  GLUCAP 157* 145* 144* 111* 120*   Lipid Profile: No results for input(s): "CHOL", "HDL", "LDLCALC", "TRIG", "CHOLHDL",  "LDLDIRECT" in the last 72 hours. Thyroid Function Tests: No results for input(s): "TSH", "T4TOTAL", "FREET4", "T3FREE", "THYROIDAB" in the last 72 hours. Anemia Panel: Recent Labs    01/25/23 0508  TIBC 356  IRON 49   Sepsis Labs: No results for input(s): "PROCALCITON", "LATICACIDVEN" in the last 168 hours.   No results found for this or any previous visit (from the past 240 hour(s)).       Radiology Studies: DG CHEST PORT 1 VIEW  Result Date: 01/24/2023 CLINICAL DATA:  Shortness of breath EXAM: PORTABLE CHEST 1 VIEW COMPARISON:  01/17/2023 FINDINGS: Cardiac shadow is stable. Aortic calcifications are again seen. Lungs are well aerated bilaterally. Mild interstitial changes are noted which may be related to edema. No focal infiltrate or effusion is seen. IMPRESSION: Suggestion of mild interstitial edema. Electronically Signed   By: Alcide Clever M.D.   On: 01/24/2023 10:51        Scheduled Meds:  amiodarone  400 mg Oral Daily   amLODipine  2.5 mg Oral Daily   atorvastatin  20 mg Oral Daily   vitamin B-12  1,000 mcg Oral Daily   enoxaparin (LOVENOX) injection  30 mg Subcutaneous Q24H   feeding supplement  237 mL Oral TID WC   insulin aspart  0-9 Units Subcutaneous TID WC   levothyroxine  125 mcg Oral Q0600   nystatin  5 mL Oral QID   pantoprazole  40 mg Oral Daily   polyethylene glycol  17 g Oral Daily   Continuous Infusions:   LOS: 7 days    Time spent: 50 minutes spent on chart review, discussion with nursing staff, consultants, updating family and interview/physical exam; more than 50% of that time was spent in counseling and/or coordination of care.    Alvira Philips [place], DO [facility] Available via Epic secure chat 7am-7pm After these hours, please refer to coverage provider listed on [email or web address] 01/25/2023, 11:15 AM

## 2023-01-25 NOTE — Progress Notes (Signed)
Physical Therapy Treatment Patient Details Name: [name] MRN: [id number] DOB: April 18, 1929 Today's Date: 01/25/2023   History of Present Illness Patient is 89 y.o. female presented with frequent bowel movements abdominal pain. 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 PMH: AVM, GERD, intermittent GI bleed.    PT Comments    General Comments: AxO x 3 very sweet Lady.  Sister "camped out" in room and very attentive/helpful. Pt on BSC on arrival.  Asissted with standing as well as balance during peri care as pt was unable to self perform.  Fatigues easily.  Required a seated rest break immediately after. General Gait Details: decreased amb distance this session due to increased c/o fatigue/weakness and COUGHING.  RA decreased to 88%.  Amb across room 6 feet to another chair.  4 min seated rest break.  More coughing.  Asissted with amb another 6 feet from chair to bed.  Unsteady.  Lateral LOB.General bed mobility comments: assisted back to bed per pt request to rest.  "Been up in the chair all day", stated pt.  "Feels good to lay down". Reapplied 2 lts nasal.  Called kitchen for some warm chicken broth.   Pt plans to return home with family support when medically stable.   Recommendations for follow up therapy are one component of a multi-disciplinary discharge planning process, led by the attending physician.  Recommendations may be updated based on patient status, additional functional criteria and insurance authorization.  Follow Up Recommendations       Assistance Recommended at Discharge Frequent or constant Supervision/Assistance  Patient can return home with the following A little  help with walking and/or transfers;A little help with bathing/dressing/bathroom;Assistance with cooking/housework;Direct supervision/assist for medications management;Assist for transportation;Help with stairs or ramp for entrance   Equipment Recommendations  None recommended by PT    Recommendations for Other Services       Precautions / Restrictions Precautions Precautions: Fall Precaution Comments: monitor vitals Restrictions Weight Bearing Restrictions: No     Mobility  Bed Mobility Overal bed mobility: Needs Assistance Bed Mobility: Sit to Supine       Sit to supine: Mod assist, Min assist   General bed mobility comments: assisted back to bed per pt request to rest.  "Been up in the chair all day", stated pt.  "Feels good to lay down".    Transfers Overall transfer level: Needs assistance Equipment used: 1 person hand held assist, Rolling walker (2 wheels) Transfers: Sit to/from Stand Sit to Stand: Min guard, Min assist           General transfer comment: pt on BSC on arrival.  Asissted with standing as well as balance during peri care as pt was unable to self perform.  Fatigues easily.  Required a seated rest break immediately after.    Ambulation/Gait Ambulation/Gait assistance: Min guard, Min assist Gait Distance (Feet): 14 Feet (7 feet x 2) Assistive device: Rolling walker (2 wheels) Gait Pattern/deviations: Step-through pattern, Decreased step length - right, Decreased step length - left, Decreased stride length, Shuffle, Trunk flexed Gait velocity: decreased     General Gait Details: decreased amb distance this session due to increased c/o fatigue/weakness and COUGHING.  RA decreased to 88%.  Amb across room 6 feet to another chair.  4 min  seated rest break.  More coughing.  Asissted with amb another 6 feet from chair to bed.  Unsteady.  Lateral LOB.   Stairs             Wheelchair Mobility    Modified Rankin (Stroke Patients Only)        Balance                                            Cognition Arousal/Alertness: Awake/alert Behavior During Therapy: WFL for tasks assessed/performed Overall Cognitive Status: Within Functional Limits for tasks assessed                                 General Comments: AxO x 3 very sweet Lady.  Sister "camped out" in room and very attentive/helpful.        Exercises      General Comments        Pertinent Vitals/Pain Pain Assessment Pain Assessment: No/denies pain    Home Living                          Prior Function            PT Goals (current goals can now be found in the care plan section) Progress towards PT goals: Progressing toward goals    Frequency    Min 1X/week      PT Plan Current plan remains appropriate    Co-evaluation              AM-PAC PT "6 Clicks" Mobility   Outcome Measure  Help needed turning from your back to your side while in a flat bed without using bedrails?: A Little Help needed moving from lying on your back to sitting on the side of a flat bed without using bedrails?: A Little Help needed moving to and from a bed to a chair (including a wheelchair)?: A Little Help needed standing up from a chair using your arms (e.g., wheelchair or bedside chair)?: A Little Help needed to walk in hospital room?: A Little Help needed climbing 3-5 steps with a railing? : A Lot 6 Click Score: 17    End of Session Equipment Utilized During Treatment: Gait belt Activity Tolerance: Patient limited by fatigue Patient left: with call bell/phone within reach;in bed;with bed alarm set;with family/visitor present Nurse Communication: Mobility status (pt voided 150 cc) PT Visit Diagnosis: Unsteadiness on feet (R26.81);Difficulty in walking, not elsewhere classified (R26.2);Other abnormalities of gait and mobility (R26.89);Muscle weakness (generalized) (M62.81);Pain     Time: 1540-1605 PT Time  Calculation (min) (ACUTE ONLY): 25 min  Charges:  $Gait Training: 8-22 mins $Therapeutic Activity: 8-22 mins                    Felecia Shelling  PTA Acute  Rehabilitation Services Office M-F          [phone]

## 2023-01-25 NOTE — Progress Notes (Signed)
Inpatient diagnosed with rectal cancer while admitted. 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   Spoke to [employer] in genetics. Patient's son is a current patient of Dr Truett Perna and has confirmed Burnadette Peter (PSM2+). Cari states there could still be benefit to testing patient, however this shouldn't be done inpatient. Recommendation to test patient once discharged. This was shared with Dr Myna Hidalgo.   Also received a phone call from [place] in pathology. Dr Kalman Drape team has placed a request for MSI testing, however there is not appropriate tissue for this testing. Per GI Pathologist next step would be BRAF testing, so this is being sent. Dr Myna Hidalgo aware.   Patient currently has follow up with Dr Truett Perna but per Dr Myna Hidalgo patient would like to follow up at this location. Will notify his navigator.   Oncology Nurse Navigator Documentation     01/25/2023    9:00 AM  Oncology Nurse Navigator Flowsheets  Navigator Location [facility]  Navigator Encounter Type Pathology Review;Other:  Patient Visit Type MedOnc  Treatment Phase Pre-Tx/Tx Discussion  Barriers/Navigation Needs Coordination of Care  Interventions Coordination of Care  Acuity Level 2-Minimal Needs (1-2 Barriers Identified)  Coordination of Care Pathology  Time Spent with Patient 30

## 2023-01-25 NOTE — Progress Notes (Signed)
Nutrition Follow-up  DOCUMENTATION CODES:   Non-severe (moderate) malnutrition in context of acute illness/injury  INTERVENTION:   -Needs updated weight for admission  -Ensure Plus High Protein po BID, each supplement provides 350 kcal and 20 grams of protein.   NUTRITION DIAGNOSIS:   Moderate Malnutrition related to acute illness (abdominal pain) as evidenced by mild fat depletion, mild muscle depletion.  Ongoing.  GOAL:   Patient will meet greater than or equal to 90% of their needs  Progressing.  MONITOR:   PO intake, Supplement acceptance, Labs, Weight trends, I & O's, Diet advancement  ASSESSMENT:   89 year old female with history of intermittent GI bleed secondary to AVMs, GERD, hypothyroidism who presented to the emergency department for the complaint of worsening abdominal pain, distention, nausea, vomiting, bright red blood per rectum.  Patient currently 100% of meals. Accepting Ensure supplements. Boost Breeze was d/c 5/15. Patient planned for bronchoscopy 5/21.  Admission weight: 147 lbs Needs updated weight for admission  Medications: Vitamin B-12, Miralax, Feraheme  Labs reviewed: CBGs: 104-267 Low Na   Diet Order:   Diet Order             DIET SOFT Room service appropriate? Yes; Fluid consistency: Thin  Diet effective now                   EDUCATION NEEDS:   No education needs have been identified at this time  Skin:  Skin Assessment: Reviewed RN Assessment  Last BM:  5/15 -type 5  Height:   Ht Readings from Last 1 Encounters:  01/19/23 5' (1.524 m)    Weight:   Wt Readings from Last 1 Encounters:  01/17/23 66.7 kg    BMI:  Body mass index is 28.71 kg/m.  Estimated Nutritional Needs:   Kcal:  1600-1800  Protein:  70-80g  Fluid:  1.8L/day  Tilda Franco, MS, RD, LDN Inpatient Clinical Dietitian Contact information available via Amion

## 2023-01-25 NOTE — Progress Notes (Signed)
So far, everything seems to be going pretty well.  It looks like the bronchoscopy was set up from 1/21.  I do think she is finally another dose of IV iron.  Her iron saturation is only 14%.  Her labs today show sodium 132.  Potassium is 3.5.  BUN 16 creatinine 0.75.  Calcium is 8.7 with an albumin of 3.2.  Her white cell count is 5.9.  The hemoglobin is 10.4.  Platelet count is 260,000.  She does not have any obvious bleeding.  There is no rectal pain.  She is having some abdominal discomfort in the left upper quadrant of the abdomen.  There is no cough or shortness of breath.  I do think that she can need some kind of low-dose anticoagulation.  She had this rectal bleeding.  However, I really think that she is at risk for thromboembolic disease.  Again, her CEA is only 3.5.  I just do not think that this nodule in the lung is metastatic from the rectal cancer.  I suppose always could be.  She was a smoker.  I think regardless, that the lung nodule should be able to be taken care of with radiosurgery.  I probably would get Radiation Oncology involved now, so they can start some Planning for treating this lung nodule.  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  This really might be incredibly advantageous for her.  If this is the case, then we might be able to get away with just utilizing immunotherapy for treatment.  We will continue to follow along.  [name], MD  Duwayne Heck 41:10

## 2023-01-26 ENCOUNTER — Inpatient Hospital Stay (HOSPITAL_COMMUNITY): Payer: Medicare PPO

## 2023-01-26 DIAGNOSIS — E876 Hypokalemia: Secondary | ICD-10-CM | POA: Diagnosis not present

## 2023-01-26 LAB — CBC
HCT: 31.5 % — ABNORMAL LOW (ref 36.0–46.0)
Hemoglobin: 9.7 g/dL — ABNORMAL LOW (ref 12.0–15.0)
MCH: 26.6 pg (ref 26.0–34.0)
MCHC: 30.8 g/dL (ref 30.0–36.0)
MCV: 86.3 fL (ref 80.0–100.0)
Platelets: 244 10*3/uL (ref 150–400)
RBC: 3.65 MIL/uL — ABNORMAL LOW (ref 3.87–5.11)
RDW: 15 % (ref 11.5–15.5)
WBC: 4.9 10*3/uL (ref 4.0–10.5)
nRBC: 0 % (ref 0.0–0.2)

## 2023-01-26 LAB — COMPREHENSIVE METABOLIC PANEL
ALT: 24 U/L (ref 0–44)
AST: 17 U/L (ref 15–41)
Albumin: 3.1 g/dL — ABNORMAL LOW (ref 3.5–5.0)
Alkaline Phosphatase: 55 U/L (ref 38–126)
Anion gap: 6 (ref 5–15)
BUN: 20 mg/dL (ref 8–23)
CO2: 32 mmol/L (ref 22–32)
Calcium: 9 mg/dL (ref 8.9–10.3)
Chloride: 99 mmol/L (ref 98–111)
Creatinine, Ser: 0.72 mg/dL (ref 0.44–1.00)
GFR, Estimated: >60 mL/min (ref >60)
Glucose, Bld: 94 mg/dL (ref 70–99)
Potassium: 3.8 mmol/L (ref 3.5–5.1)
Sodium: 137 mmol/L (ref 135–145)
Total Bilirubin: 0.8 mg/dL (ref 0.3–1.2)
Total Protein: 6 g/dL — ABNORMAL LOW (ref 6.5–8.1)

## 2023-01-26 LAB — GLUCOSE, CAPILLARY
Glucose-Capillary: 104 mg/dL — ABNORMAL HIGH (ref 70–99)
Glucose-Capillary: 173 mg/dL — ABNORMAL HIGH (ref 70–99)
Glucose-Capillary: 102 mg/dL — ABNORMAL HIGH (ref 70–99)
Glucose-Capillary: 137 mg/dL — ABNORMAL HIGH (ref 70–99)

## 2023-01-26 LAB — BRAIN NATRIURETIC PEPTIDE: B Natriuretic Peptide: 87.1 pg/mL (ref 0.0–100.0)

## 2023-01-26 LAB — MAGNESIUM: Magnesium: 1.9 mg/dL (ref 1.7–2.4)

## 2023-01-26 LAB — PHOSPHORUS: Phosphorus: 4.5 mg/dL (ref 2.5–4.6)

## 2023-01-26 MED ORDER — POLYETHYLENE GLYCOL 3350 17 G PO PACK: 17.0000 g | PACK | Freq: Every day | ORAL | Status: DC | PRN

## 2023-01-26 NOTE — Progress Notes (Signed)
Chaplain followed up with [name] and her family. [name] expressed her confusion on her treatment plan. She doesn't know if she will be receiving her port soon or getting a biopsy on her lung. This has been a point of stress for [name] because she wants to ensure she is getting the best chemo to attack what is happening on her lung. [name] also has felt emotional as she doesn't want to die and leave her son who has also been battling cancer.   [name] is holding on to her faith and working to stay strong. Chaplain continues to offer prayer and encouragement.   Chaplain Chancie Lampert  01/26/23 1400  Spiritual Encounters  Type of Visit Follow up  Care provided to: Pt and family  Reason for visit Routine spiritual support  Interventions  Spiritual Care Interventions Made Prayer;Encouragement

## 2023-01-26 NOTE — Plan of Care (Signed)

## 2023-01-26 NOTE — Progress Notes (Signed)
PROGRESS NOTE    [name]  UUV:[id number] DOB: 1928-12-08 DOA: 01/17/2023 PCP: Creola Corn, MD    Brief Narrative:   [name] is a 89 y.o. female with past medical history significant for intermittent GI bleed secondary to AVM, GERD, hypothyroidism who presented to [facility] ED on 5/8 with abdominal pain, distention, nausea/vomiting and bright red blood per rectum.  Family called her GI office and was encouraged to go to the ED for further evaluation.  Recently started on MiraLAX for fecal impaction now with with increased frequency of bowel movements.  In the ED, temperature 98.2 F, HR 90, RR 17, SpO2 90% on room air.  WBC 5.5, hemoglobin 11.4, platelet count 276.  Sodium 134, potassium 2.5, chloride 96, CO2 28, glucose 203, BUN 12, creatinine 0.69.  Lipase 34, AST 18, ALT 19, total bilirubin 0.7.  CT abdomen/pelvis with contrast on 5/8 with no acute findings.  GI was consulted.  TRH consulted for admission for further evaluation and management.  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  Recently seen by GI outpatient with fecal impaction and started on MiraLAX.  CT abdomen/pelvis with contrast on admission with no significant finding.  Initially thought to be related to encopresis/overflow from constipation; although rectal mass palpated on DRE by GI.  Patient underwent flexible sigmoidoscopy by Dr. Rhea Belton on 01/20/2023 with findings of partially obstructing tumor in the mid/distal rectum and biopsy was taken.  CT chest with contrast with spiculated nodule right upper lobe.  MR pelvis 5/12 with large polypoid tumor in distal rectum along right wall classified as T3b N1.  GI and general surgery now signed off.  Medical oncology plans outpatient follow-up scheduled on Feb 07, 2023 with  Dr. Truett Perna.  Patient and family deferring port placement outpatient currently. -- General surgery, medical oncology, GI following; appreciate assistance -- CEA: 3.5 -- Pathology from rectal biopsy: Moderately differentiated adenocarcinoma -- Norco 1-2 tablets every 4 hours as needed moderate pain -- Morphine 2 mg IV every 3 hours as needed severe pain -- Robaxin 500 mg p.o. every 8 hours as needed muscle spasms -- Regular diet -- Pulmonology following for planned bronchoscopy/biopsy of pulmonary nodule scheduled on 01/30/2023  Acute respiratory failure with hypoxia, not POA Repeat chest x-ray this morning with concern of mild interstitial edema; no consolidation, afebrile.  Received IV Lasix on 5/15. -- Continue supplemental oxygen, maintain SpO2 greater than 92% -- Repeat chest x-ray 01/26/2023 shows increasing patchy hazy opacities in right lung. -- Continue to encourage incentive spirometer use  Hypokalemia Hypomagnesemia Repleted during hospitalization.  Iron/B12 deficiency anemia Anemia panel with iron 25, TIBC 378, ferritin 5, folate 11.8, vitamin B12 158.  Repeat anemia panel with iron 49, TIBC 356 on 5/16 -- Received IV iron on 5/9 and 5/10 -- Vitamin B12 1000 mcg p.o. daily  Anxiety -- Lorazepam 1 mg p.o. 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 daily x 2 weeks then reduce to 200 mg p.o. daily -- TTE: LVEF 60 to 65%, no regional LV wall motion normalities, indeterminate diastolic function, mild AS, normal IVC. -- Will need monitoring of LFTs, TFTs every 6 months, eye exam yearly -- Continue to monitor on telemetry  History of GIB secondary to  AVMs -- Hgb 11.4>>9.6>9.4>9.7>9.4; stable -- CBC daily, transfuse for hemoglobin less than 7.0 or active bleeding  Hypothyroidism -- Levothyroxine 125 mcg p.o. daily  GERD -- Protonix 40 mg  p.o. daily   DVT prophylaxis: enoxaparin (LOVENOX) injection 40 mg Start: 01/26/23 0800 SCDs Start: 01/18/23 0111    Code Status: Full Code Family Communication: Sister at the bedside.  Disposition Plan:  Level of care: Med-Surg Status is: Inpatient Remains inpatient appropriate because: Pending bronchoscopy with biopsy of pulmonary lesion while inpatient per pulmonology, otherwise anticipate discharge home soon with outpatient follow-up with GI/general surgery/oncology    Consultants:  [facility] gastroenterology: Signed off General surgery: Signed off Cardiology Medical oncology: Signed off with outpatient follow-up scheduled Pulmonology  Procedures:  Flex sigmoidoscopy, Dr. Rhea Belton 5/11 TTE:   Antimicrobials:  None   Subjective: Patient seen and examined.  No complaints.  Just annoyed that she was woken up at 5 AM for chest x-ray.  Objective: Vitals:   01/25/23 0443 01/25/23 1237 01/25/23 1947 01/26/23 0527  BP: (!) 159/75 (!) 136/58 (!) 165/61 (!) 144/58  Pulse: 89 69 74 67  Resp: 15 17 20 16   Temp: 98.2 F (36.8 C) 97.6 F (36.4 C) 97.8 F (36.6 C) 97.8 F (36.6 C)  TempSrc:  Oral    SpO2: 95% 100% 98% 96%  Weight:      Height:        Intake/Output Summary (Last 24 hours) at 01/26/2023 1114 Last data filed at 01/26/2023 0839 Gross per 24 hour  Intake 240 ml  Output --  Net 240 ml     Filed Weights   01/17/23 1914  Weight: 66.7 kg    Examination:  Physical Exam: General exam: Appears calm and comfortable  Respiratory system: Slightly diminished breath sounds at the bases bilaterally. Respiratory effort normal. Cardiovascular system: S1 & S2 heard, RRR. No JVD, murmurs, rubs, gallops or clicks. No pedal edema. Gastrointestinal system: Abdomen is nondistended, soft and nontender. No organomegaly or masses felt. Normal bowel sounds heard. Central nervous system: Alert and oriented. No focal neurological deficits. Extremities: Symmetric 5 x 5  power. Skin: No rashes, lesions or ulcers.  Psychiatry: Judgement and insight appear normal. Mood & affect appropriate.   Data Reviewed: I have personally reviewed following labs and imaging studies  CBC: Recent Labs  Lab 01/21/23 0453 01/22/23 0438 01/23/23 0539 01/25/23 0508 01/26/23 0515  WBC 5.3 4.7 4.3 5.9 4.9  HGB 9.3* 9.7* 9.4* 10.4* 9.7*  HCT 29.8* 31.3* 30.4* 33.4* 31.5*  MCV 84.4 84.4 86.1 84.8 86.3  PLT 216 220 221 260 244    Basic Metabolic Panel: Recent Labs  Lab 01/21/23 0453 01/22/23 0438 01/23/23 0539 01/25/23 0508 01/26/23 0515  NA 136 136 137 132* 137  K 3.5 3.2* 4.0 3.5 3.8  CL 101 99 101 94* 99  CO2 28 29 31 30  32  GLUCOSE 116* 104* 120* 113* 94  BUN 7* 10 12 16 20   CREATININE 0.59 0.55 0.68 0.75 0.72  CALCIUM 8.3* 8.3* 8.5* 8.7* 9.0  MG  --  1.8 1.9 1.7 1.9  PHOS  --   --   --   --  4.5    GFR: Estimated Creatinine Clearance: 39 mL/min (by C-G formula based on SCr of 0.72 mg/dL). Liver Function Tests: Recent Labs  Lab 01/25/23 0508 01/26/23 0515  AST 20 17  ALT 30 24  ALKPHOS 66 55  BILITOT 0.7 0.8  PROT 6.5 6.0*  ALBUMIN 3.2* 3.1*    No  results for input(s): "LIPASE", "AMYLASE" in the last 168 hours.  No results for input(s): "AMMONIA" in the last 168 hours. Coagulation Profile: No results for input(s): "INR", "PROTIME" in the last 168 hours. Cardiac Enzymes: No results for input(s): "CKTOTAL", "CKMB", "CKMBINDEX", "TROPONINI" in the last 168 hours.  BNP (last 3 results) No results for input(s): "PROBNP" in the last 8760 hours. HbA1C: No results for input(s): "HGBA1C" in the last 72 hours. CBG: Recent Labs  Lab 01/25/23 0719 01/25/23 1141 01/25/23 1620 01/25/23 1944 01/26/23 0731  GLUCAP 120* 267* 125* 163* 104*    Lipid Profile: No results for input(s): "CHOL", "HDL", "LDLCALC", "TRIG", "CHOLHDL", "LDLDIRECT" in the last 72 hours. Thyroid Function Tests: No results for input(s): "TSH", "T4TOTAL", "FREET4",  "T3FREE", "THYROIDAB" in the last 72 hours. Anemia Panel: Recent Labs    01/25/23 0508  TIBC 356  IRON 49    Sepsis Labs: No results for input(s): "PROCALCITON", "LATICACIDVEN" in the last 168 hours.   No results found for this or any previous visit (from the past 240 hour(s)).       Radiology Studies: DG CHEST PORT 1 VIEW  Result Date: 01/26/2023 CLINICAL DATA:  [id number] with shortness of breath. EXAM: PORTABLE CHEST 1 VIEW COMPARISON:  Portable chest 01/24/2023 FINDINGS: 5:05 a.m. The lungs are emphysematous. There is increasing patchy hazy opacity in the right lower lung field. Findings concerning for pneumonia. Small pleural effusions may have developed as well. The remaining lungs appear clear. There is aortic calcific plaque with stable mediastinum, normal cardiac size. There are no findings of acute CHF. There is osteopenia and thoracic spondylosis. IMPRESSION: 1. Increasing patchy hazy opacity in the right lower lung field concerning for pneumonia. Small pleural effusions may have developed as well. Follow-up study recommended with PA and lateral views. 2. Emphysema. 3. Aortic atherosclerosis. Electronically Signed   By: Almira Bar M.D.   On: 01/26/2023 07:29        Scheduled Meds:  amiodarone  400 mg Oral Daily   amLODipine  2.5 mg Oral Daily   atorvastatin  20 mg Oral Daily   vitamin B-12  1,000 mcg Oral Daily   enoxaparin (LOVENOX) injection  40 mg Subcutaneous Q24H   feeding supplement  237 mL Oral TID WC   fluticasone  2 spray Each Nare Daily   insulin aspart  0-9 Units Subcutaneous TID WC   levothyroxine  125 mcg Oral Q0600   loratadine  10 mg Oral Daily   nystatin  5 mL Oral QID   pantoprazole  40 mg Oral Daily   polyethylene glycol  17 g Oral Daily   Continuous Infusions:   LOS: 8 days    Time spent: 50 minutes spent on chart review, discussion with nursing staff, consultants, updating family and interview/physical exam; more than 50% of that time was  spent in counseling and/or coordination of care.    Hughie Closs, DO [facility] Available via Epic secure chat 7am-7pm After these hours, please refer to coverage provider listed on [email or web address] 01/26/2023, 11:14 AM

## 2023-01-27 DIAGNOSIS — E876 Hypokalemia: Secondary | ICD-10-CM | POA: Diagnosis not present

## 2023-01-27 LAB — COMPREHENSIVE METABOLIC PANEL WITH GFR
ALT: 20 U/L (ref 0–44)
AST: 16 U/L (ref 15–41)
Albumin: 3 g/dL — ABNORMAL LOW (ref 3.5–5.0)
Alkaline Phosphatase: 54 U/L (ref 38–126)
Anion gap: 7 (ref 5–15)
BUN: 14 mg/dL (ref 8–23)
CO2: 29 mmol/L (ref 22–32)
Calcium: 8.7 mg/dL — ABNORMAL LOW (ref 8.9–10.3)
Chloride: 99 mmol/L (ref 98–111)
Creatinine, Ser: 0.69 mg/dL (ref 0.44–1.00)
GFR, Estimated: >60 mL/min
Glucose, Bld: 94 mg/dL (ref 70–99)
Potassium: 3.8 mmol/L (ref 3.5–5.1)
Sodium: 135 mmol/L (ref 135–145)
Total Bilirubin: 0.8 mg/dL (ref 0.3–1.2)
Total Protein: 5.7 g/dL — ABNORMAL LOW (ref 6.5–8.1)

## 2023-01-27 LAB — CBC
HCT: 31.2 % — ABNORMAL LOW (ref 36.0–46.0)
Hemoglobin: 9.7 g/dL — ABNORMAL LOW (ref 12.0–15.0)
MCH: 27.3 pg (ref 26.0–34.0)
MCHC: 31.1 g/dL (ref 30.0–36.0)
MCV: 87.9 fL (ref 80.0–100.0)
Platelets: 245 10*3/uL (ref 150–400)
RBC: 3.55 MIL/uL — ABNORMAL LOW (ref 3.87–5.11)
RDW: 15.2 % (ref 11.5–15.5)
WBC: 5.2 10*3/uL (ref 4.0–10.5)
nRBC: 0 % (ref 0.0–0.2)

## 2023-01-27 LAB — GLUCOSE, CAPILLARY
Glucose-Capillary: 87 mg/dL (ref 70–99)
Glucose-Capillary: 125 mg/dL — ABNORMAL HIGH (ref 70–99)
Glucose-Capillary: 103 mg/dL — ABNORMAL HIGH (ref 70–99)
Glucose-Capillary: 148 mg/dL — ABNORMAL HIGH (ref 70–99)

## 2023-01-27 LAB — PROCALCITONIN: Procalcitonin: <0.1 ng/mL

## 2023-01-27 NOTE — Progress Notes (Signed)
SATURATION QUALIFICATIONS: (This note is used to comply with regulatory documentation for home oxygen)  Patient Saturations on Room Air at Rest = 95%  Patient Saturations on Room Air while Ambulating = 92%  Patient Saturations on 0 Liters of oxygen while Ambulating = 92%  Please briefly explain why patient needs home oxygen: Pt maintained a 92-94 saturation while ambulating no O2 required

## 2023-01-27 NOTE — Plan of Care (Signed)

## 2023-01-27 NOTE — Progress Notes (Signed)
This nurse was in pt room to administer her 10 AM medications when this nurse was asked to help pt with her cell phone. The pt and her sister could not get it to work and her son had been calling. This nurse was able to get phone working and called so and handed the pt her cell phone. The cell phone was then put on speaker. Pt son started off conversation with the pt about her not being ready for DC and she was not aloud to come home yet. This nurse was scanning and opening the 10 AM meds. The son continued to complain to the pt and the sister in the room started telling the son that she was told yesterday she could go. There will be outpatient follow up for everything. The patient and the son insisted she did not know what she was talking about. The son is insistent that his mom needs chemo,rehab, blood transfusion,and off oxygen before she is stable to return home.  The sister then started telling me that "every time she tries to tell them something they get aggressive and yell at her and this time was no different. They just will not listen."  The son asked for my name so he could report me. I gave him my name told him he could call [facility] and talk to charge nurse about me. He insisted on my last name as well. Yelling at me told me as of Monday I will no longer be a nurse let alone a charge nurse. I told pt he was more then welcome to come up here and we would get the doctors in the room and they could all talk and he stated :" you don't want me coming up there." I told the pt I was leaving the room as conversation was escalating and we were getting nowhere. The pt yelled at me told me not to talk to her son that way and to get out. I took the meds and left. This nurse was then called back to give her morning meds and pt was angry they were now late as it was after 11 AM. Pt refused labs when they came around and then 10 min later called back out to get labs to come back. Pt is in room comfortable,in bed,  has had  all medications and both physicians have been made aware of situation.

## 2023-01-27 NOTE — Progress Notes (Signed)
PROGRESS NOTE    [name]  ZOX:[id number] DOB: Jan 15, 1929 DOA: 01/17/2023 PCP: Creola Corn, MD    Brief Narrative:   [name] is a 89 y.o. female with past medical history significant for intermittent GI bleed secondary to AVM, GERD, hypothyroidism who presented to [facility] ED on 5/8 with abdominal pain, distention, nausea/vomiting and bright red blood per rectum.  Family called her GI office and was encouraged to go to the ED for further evaluation.  Recently started on MiraLAX for fecal impaction now with with increased frequency of bowel movements.  In the ED, temperature 98.2 F, HR 90, RR 17, SpO2 90% on room air.  WBC 5.5, hemoglobin 11.4, platelet count 276.  Sodium 134, potassium 2.5, chloride 96, CO2 28, glucose 203, BUN 12, creatinine 0.69.  Lipase 34, AST 18, ALT 19, total bilirubin 0.7.  CT abdomen/pelvis with contrast on 5/8 with no acute findings.  GI was consulted.  TRH consulted for admission for further evaluation and management.  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  Recently seen by GI outpatient with fecal impaction and started on MiraLAX.  CT abdomen/pelvis with contrast on admission with no significant finding.  Initially thought to be related to encopresis/overflow from constipation; although rectal mass palpated on DRE by GI.  Patient underwent flexible sigmoidoscopy by Dr. Rhea Belton on 01/20/2023 with findings of partially obstructing tumor in the mid/distal rectum and biopsy was taken.  CT chest with contrast with spiculated nodule right upper lobe.  MR pelvis 5/12 with large polypoid tumor in distal rectum along right wall classified as T3b N1.  GI and general surgery now signed off.  Medical oncology plans outpatient follow-up scheduled on Feb 07, 2023 with  Dr. Truett Perna.  Patient and family deferring port placement outpatient currently. -- CEA: 3.5 -- Pathology from rectal biopsy: Moderately differentiated adenocarcinoma -- Norco 1-2 tablets every 4 hours as needed moderate pain -- Morphine 2 mg IV every 3 hours as needed severe pain -- Robaxin 500 mg p.o. every 8 hours as needed muscle spasms -- Regular diet -- Pulmonology following for planned bronchoscopy/biopsy of pulmonary nodule scheduled on 01/30/2023 however per my discussion with Dr. Myna Hidalgo, he does not think that bronchoscopy is needed anymore.  Details below.  Acute respiratory failure with hypoxia, not POA Repeat chest x-ray this morning with concern of mild interstitial edema; no consolidation, afebrile.  Received IV Lasix on 5/15. -- Continue supplemental oxygen, maintain SpO2 greater than 92% -- Repeat chest x-ray 01/26/2023 shows increasing patchy hazy opacities in right lung.  Could be metastasis.  Checking procalcitonin.  Patient has no symptoms. -- Continue to encourage incentive spirometer use  Hypokalemia Hypomagnesemia Resolved.  Iron/B12 deficiency anemia Anemia panel with iron 25, TIBC 378, ferritin 5, folate 11.8, vitamin B12 158.  Repeat anemia panel with iron 49, TIBC 356 on 5/16 -- Received IV iron on 5/9 and 5/10.  Hemoglobin 9.7 and rocksolid stable for last 7 days -- Vitamin B12 1000 mcg p.o. daily  Anxiety -- Lorazepam 1 mg p.o. 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 daily x 2 weeks then reduce to 200 mg p.o. daily -- TTE: LVEF 60 to 65%, no regional LV wall motion normalities, indeterminate diastolic  function, mild AS, normal IVC. -- Will need monitoring of LFTs, TFTs every 6 months, eye exam yearly -- Continue to monitor on telemetry  History of GIB secondary to AVMs -- Hgb 11.4>>9.6>9.4>9.7>9.4; stable -- CBC  daily, transfuse for hemoglobin less than 7.0 or active bleeding  Hypothyroidism -- Levothyroxine 125 mcg p.o. daily  GERD -- Protonix 40 mg p.o. daily   DVT prophylaxis: enoxaparin (LOVENOX) injection 40 mg Start: 01/26/23 0800 SCDs Start: 01/18/23 0111    Code Status: Full Code Family Communication: Sister at the bedside.  Long discussion with the son over the phone in the room.  Disposition Plan:  Level of care: Med-Surg Status is: Inpatient Remains inpatient appropriate because: Patient is medically stable.  Son is resisting discharge.    Consultants:  [place] gastroenterology: Signed off General surgery: Signed off Cardiology Medical oncology: Signed off with outpatient follow-up scheduled Pulmonology  Procedures:  Flex sigmoidoscopy, Dr. Rhea Belton 5/11 TTE:   Antimicrobials:  None   Subjective: Patient seen and examined.  Sister at the bedside.  Patient had no complaint other than generalized weakness.  The sister and patient both wanted me to talk to the son over the phone so I did while I was in patient's room.  I had also spoken to the son yesterday and he told me that Dr. Myna Hidalgo had told the patient that patient does not need bronchoscopy anymore.  I was not aware of that until this morning.  I explained to her son that I was able to see Dr. Myna Hidalgo this morning and he did explain that due to mutation, patient could be treated with immunotherapy and she does not need bronchoscopy anymore and that patient can be discharged.  The son got furious as soon as he heard me talking about discharging her home today.  Initially he said that " how can she go home, she is having accidents, she is needing help to get to the bathroom and physical therapy has not have not seen her yet" I informed him that she has been seen by PT 2 days ago and they have recommended home health PT which we can arrange for her.  He then himself said " well she has rectal cancer and that is why she is  having accidents, how can we take her home like that" to that I informed him that unfortunately, there is no quick fix for that, she is going to get immunotherapy as outpatient and slowly and gradually accidents also should improve.  He then said " well her hemoglobin is very low, how can she go home with that" I informed him that her hemoglobin has been stable around 9.7 for last 1 week at least and she does not need blood transfusion until it drops less than 7 and that she can go home on oral iron pills.  Even though I was able to address all his concerns, he remained angry and unsatisfied.  To de-escalate the situation, I told him that I understand that it is a surprise for him to talk about discharge plan because he was not expecting that, how he would feel if we keep her here overnight so that he can make all the arrangements that he needs to and we can plan on discharging her tomorrow.  He said " that sounds better, we will see tomorrow and I will try to make arrangements that I need to".  However, I received a message from the nurse 10 to 15 minutes later that patient was still  very angry and in fact rude to the nurse as well.  He demanded that patient be seen physically by Dr. Myna Hidalgo, Dr. Myna Hidalgo was sent a message as well.  I was informed by the nurse that he also demanded that he is not going to take the patient home until this patient gets all the treatment for her cancer here and gets rehab in the hospital as well.  Objective: Vitals:   01/26/23 0527 01/26/23 1158 01/26/23 2022 01/27/23 0416  BP: (!) 144/58 (!) 153/54 (!) 166/63 (!) 163/57  Pulse: 67 69 73 73  Resp: 16 16 20 16   Temp: 97.8 F (36.6 C) 97.7 F (36.5 C) 97.6 F (36.4 C) 98 F (36.7 C)  TempSrc:  Oral  Oral  SpO2: 96% 97% 95% 94%  Weight:      Height:        Intake/Output Summary (Last 24 hours) at 01/27/2023 1002 Last data filed at 01/26/2023 1344 Gross per 24 hour  Intake 240 ml  Output --  Net 240 ml       Filed Weights   01/17/23 1914  Weight: 66.7 kg    Examination:  Physical Exam:  General exam: Appears calm and comfortable  Respiratory system: Diminished breath sounds at the bases bilaterally respiratory effort normal. Cardiovascular system: S1 & S2 heard, RRR. No JVD, murmurs, rubs, gallops or clicks. No pedal edema. Gastrointestinal system: Abdomen is nondistended, soft and nontender. No organomegaly or masses felt. Normal bowel sounds heard. Central nervous system: Alert and oriented. No focal neurological deficits. Extremities: Symmetric 5 x 5 power. Skin: No rashes, lesions or ulcers.  Psychiatry: Judgement and insight appear poor  Data Reviewed: I have personally reviewed following labs and imaging studies  CBC: Recent Labs  Lab 01/22/23 0438 01/23/23 0539 01/25/23 0508 01/26/23 0515 01/27/23 0654  WBC 4.7 4.3 5.9 4.9 5.2  HGB 9.7* 9.4* 10.4* 9.7* 9.7*  HCT 31.3* 30.4* 33.4* 31.5* 31.2*  MCV 84.4 86.1 84.8 86.3 87.9  PLT 220 221 260 244 245    Basic Metabolic Panel: Recent Labs  Lab 01/22/23 0438 01/23/23 0539 01/25/23 0508 01/26/23 0515 01/27/23 0654  NA 136 137 132* 137 135  K 3.2* 4.0 3.5 3.8 3.8  CL 99 101 94* 99 99  CO2 29 31 30  32 29  GLUCOSE 104* 120* 113* 94 94  BUN 10 12 16 20 14   CREATININE 0.55 0.68 0.75 0.72 0.69  CALCIUM 8.3* 8.5* 8.7* 9.0 8.7*  MG 1.8 1.9 1.7 1.9  --   PHOS  --   --   --  4.5  --     GFR: Estimated Creatinine Clearance: 39 mL/min (by C-G formula based on SCr of 0.69 mg/dL). Liver Function Tests: Recent Labs  Lab 01/25/23 0508 01/26/23 0515 01/27/23 0654  AST 20 17 16   ALT 30 24 20   ALKPHOS 66 55 54  BILITOT 0.7 0.8 0.8  PROT 6.5 6.0* 5.7*  ALBUMIN 3.2* 3.1* 3.0*    No results for input(s): "LIPASE", "AMYLASE" in the last 168 hours.  No results for input(s): "AMMONIA" in the last 168 hours. Coagulation Profile: No results for input(s): "INR", "PROTIME" in the last 168 hours. Cardiac  Enzymes: No results for input(s): "CKTOTAL", "CKMB", "CKMBINDEX", "TROPONINI" in the last 168 hours.  BNP (last 3 results) No results for input(s): "PROBNP" in the last 8760 hours. HbA1C: No results for input(s): "HGBA1C" in the last 72 hours. CBG: Recent Labs  Lab 01/26/23 0731 01/26/23 1125 01/26/23 1714  01/26/23 2023 01/27/23 0731  GLUCAP 104* 173* 102* 137* 87    Lipid Profile: No results for input(s): "CHOL", "HDL", "LDLCALC", "TRIG", "CHOLHDL", "LDLDIRECT" in the last 72 hours. Thyroid Function Tests: No results for input(s): "TSH", "T4TOTAL", "FREET4", "T3FREE", "THYROIDAB" in the last 72 hours. Anemia Panel: Recent Labs    01/25/23 0508  TIBC 356  IRON 49    Sepsis Labs: No results for input(s): "PROCALCITON", "LATICACIDVEN" in the last 168 hours.   No results found for this or any previous visit (from the past 240 hour(s)).       Radiology Studies: DG CHEST PORT 1 VIEW  Result Date: 01/26/2023 CLINICAL DATA:  [id number] with shortness of breath. EXAM: PORTABLE CHEST 1 VIEW COMPARISON:  Portable chest 01/24/2023 FINDINGS: 5:05 a.m. The lungs are emphysematous. There is increasing patchy hazy opacity in the right lower lung field. Findings concerning for pneumonia. Small pleural effusions may have developed as well. The remaining lungs appear clear. There is aortic calcific plaque with stable mediastinum, normal cardiac size. There are no findings of acute CHF. There is osteopenia and thoracic spondylosis. IMPRESSION: 1. Increasing patchy hazy opacity in the right lower lung field concerning for pneumonia. Small pleural effusions may have developed as well. Follow-up study recommended with PA and lateral views. 2. Emphysema. 3. Aortic atherosclerosis. Electronically Signed   By: Almira Bar M.D.   On: 01/26/2023 07:29        Scheduled Meds:  amiodarone  400 mg Oral Daily   amLODipine  2.5 mg Oral Daily   atorvastatin  20 mg Oral Daily   vitamin B-12  1,000  mcg Oral Daily   enoxaparin (LOVENOX) injection  40 mg Subcutaneous Q24H   feeding supplement  237 mL Oral TID WC   fluticasone  2 spray Each Nare Daily   insulin aspart  0-9 Units Subcutaneous TID WC   levothyroxine  125 mcg Oral Q0600   loratadine  10 mg Oral Daily   nystatin  5 mL Oral QID   pantoprazole  40 mg Oral Daily   Continuous Infusions:   LOS: 9 days    Time spent: 50 minutes spent on chart review, discussion with nursing staff, consultants, updating family and interview/physical exam; more than 50% of that time was spent in counseling and/or coordination of care.    Hughie Closs, DO [facility] Available via Epic secure chat 7am-7pm After these hours, please refer to coverage provider listed on [email or web address] 01/27/2023, 10:02 AM

## 2023-01-28 DIAGNOSIS — E876 Hypokalemia: Secondary | ICD-10-CM | POA: Diagnosis not present

## 2023-01-28 LAB — COMPREHENSIVE METABOLIC PANEL
ALT: 21 U/L (ref 0–44)
AST: 16 U/L (ref 15–41)
Albumin: 3.2 g/dL — ABNORMAL LOW (ref 3.5–5.0)
Alkaline Phosphatase: 55 U/L (ref 38–126)
Anion gap: 7 (ref 5–15)
BUN: 14 mg/dL (ref 8–23)
CO2: 30 mmol/L (ref 22–32)
Calcium: 9.2 mg/dL (ref 8.9–10.3)
Chloride: 99 mmol/L (ref 98–111)
Creatinine, Ser: 0.62 mg/dL (ref 0.44–1.00)
GFR, Estimated: >60 mL/min (ref >60)
Glucose, Bld: 99 mg/dL (ref 70–99)
Potassium: 3.6 mmol/L (ref 3.5–5.1)
Sodium: 136 mmol/L (ref 135–145)
Total Bilirubin: 0.8 mg/dL (ref 0.3–1.2)
Total Protein: 6.2 g/dL — ABNORMAL LOW (ref 6.5–8.1)

## 2023-01-28 LAB — GLUCOSE, CAPILLARY
Glucose-Capillary: 95 mg/dL (ref 70–99)
Glucose-Capillary: 138 mg/dL — ABNORMAL HIGH (ref 70–99)

## 2023-01-28 LAB — CBC
HCT: 33.9 % — ABNORMAL LOW (ref 36.0–46.0)
Hemoglobin: 10.4 g/dL — ABNORMAL LOW (ref 12.0–15.0)
MCH: 26.6 pg (ref 26.0–34.0)
MCHC: 30.7 g/dL (ref 30.0–36.0)
MCV: 86.7 fL (ref 80.0–100.0)
Platelets: 254 10*3/uL (ref 150–400)
RBC: 3.91 MIL/uL (ref 3.87–5.11)
RDW: 15.4 % (ref 11.5–15.5)
WBC: 4.9 10*3/uL (ref 4.0–10.5)
nRBC: 0 % (ref 0.0–0.2)

## 2023-01-28 MED ORDER — NYSTATIN 100000 UNIT/ML MT SUSP: 5.0000 mL | Freq: Four times a day (QID) | OROMUCOSAL | 0 refills | Status: DC

## 2023-01-28 MED ORDER — AMLODIPINE BESYLATE 2.5 MG PO TABS: 2.5000 mg | ORAL_TABLET | Freq: Every day | ORAL | 0 refills | Status: DC

## 2023-01-28 MED ORDER — FLUTICASONE PROPIONATE 50 MCG/ACT NA SUSP: 2.0000 | Freq: Every day | NASAL | 0 refills | Status: AC

## 2023-01-28 MED ORDER — HYDROCODONE-ACETAMINOPHEN 5-325 MG PO TABS: 1.0000 | ORAL_TABLET | ORAL | 0 refills | Status: DC | PRN

## 2023-01-28 MED ORDER — GUAIFENESIN-DM 100-10 MG/5ML PO SYRP: 10.0000 mL | ORAL_SOLUTION | ORAL | 0 refills | Status: DC | PRN

## 2023-01-28 MED ORDER — AMIODARONE HCL 400 MG PO TABS: 400.0000 mg | ORAL_TABLET | Freq: Every day | ORAL | 0 refills | Status: DC

## 2023-01-28 MED ORDER — LORATADINE 10 MG PO TABS: 10.0000 mg | ORAL_TABLET | Freq: Every day | ORAL | 0 refills | Status: AC

## 2023-01-28 MED ORDER — AMIODARONE HCL 200 MG PO TABS: 200.0000 mg | ORAL_TABLET | Freq: Every day | ORAL | 0 refills | Status: DC

## 2023-01-28 MED ORDER — ENSURE ENLIVE PO LIQD: 237.0000 mL | Freq: Three times a day (TID) | ORAL | 12 refills | Status: DC

## 2023-01-28 MED ORDER — POLYETHYLENE GLYCOL 3350 17 G PO PACK: 17.0000 g | PACK | Freq: Every day | ORAL | 0 refills | Status: DC | PRN

## 2023-01-28 MED ORDER — ACETAMINOPHEN 325 MG PO TABS: 650.0000 mg | ORAL_TABLET | Freq: Four times a day (QID) | ORAL | 0 refills | Status: AC | PRN

## 2023-01-28 MED ORDER — CYANOCOBALAMIN 1000 MCG PO TABS: 1000.0000 ug | ORAL_TABLET | Freq: Every day | ORAL | 0 refills | Status: AC

## 2023-01-28 MED ORDER — ONDANSETRON HCL 4 MG PO TABS: 4.0000 mg | ORAL_TABLET | Freq: Four times a day (QID) | ORAL | 0 refills | Status: DC | PRN

## 2023-01-28 MED ORDER — METHOCARBAMOL 500 MG PO TABS: 500.0000 mg | ORAL_TABLET | Freq: Three times a day (TID) | ORAL | 0 refills | Status: DC | PRN

## 2023-01-28 NOTE — TOC Transition Note (Addendum)
Transition of Care [facility]) - CM/SW Discharge Note   Patient Details  Name: [name] MRN: [id number] Date of Birth: 03/14/1929  Transition of Care [facility]) CM/SW Contact:  Adrian Prows, RN Phone Number: 01/28/2023, 10:29 AM   Clinical Narrative:    D/C orders received; notified Amy Hyatt at [place] pt will d/c today; pt's son [name] also given name of [facility] agency; agency contact info placed in follow up provider section of d/c instructions; pt's son [name] says pt needs walker; explained no recc for equipment per PT; Dr Natale Milch notified Via secure chat; No TOC needs.  -1502- orders received for RW; spoke w/ Jermaine at [facility]; dme will be delivered to pt's room; Amy Hyatt at [place] notified orders for [facility] received; she says agency can be added; no TOC needs.  -1545- notified pt's transportation must leave; contacted Jermaine at [facility]; walker will be delivered to pt's home today; confirmed d/c address: [address] [place] [address]; pt and family notified and ok w/ plan; no TOC needs.   Final next level of care: Home w Home Health Services Barriers to Discharge: No Barriers Identified   Patient Goals and CMS Choice CMS Medicare.gov Compare Post Acute Care list provided to:: Patient Choice offered to / list presented to : Patient  Discharge Placement                         Discharge Plan and Services Additional resources added to the After Visit Summary for   In-house Referral: Clinical Social Work Discharge Planning Services: NA Post Acute Care Choice: Home Health          DME Arranged: N/A DME Agency: NA       HH Arranged: PT HH Agency: [facility] Date [facility] Agency Contacted: 01/19/23 Time HH Agency Contacted: 1226 Representative spoke with at [facility] Agency: Amy  Social Determinants of Health (SDOH) Interventions SDOH Screenings   Food Insecurity: No Food Insecurity (01/18/2023)  Housing: Low Risk  (01/18/2023)  Transportation Needs: No Transportation  Needs (01/18/2023)  Utilities: Not At Risk (01/18/2023)  Tobacco Use: Medium Risk (01/22/2023)     Readmission Risk Interventions    01/19/2023   12:24 PM  Readmission Risk Prevention Plan  Post Dischage Appt Complete  Medication Screening Complete  Transportation Screening Complete

## 2023-01-28 NOTE — Progress Notes (Signed)
Physical Therapy Treatment Patient Details Name: [name] MRN: [id number] DOB: Sep 08, 1929 Today's Date: 01/28/2023   History of Present Illness Patient is 89 y.o. female presented with frequent bowel movements abdominal pain.  Patient was admitted 01/17/23 with moderately differentiated rectal adenocarcinoma with suspected solitary pulmonary metastasis, acute respiratory failure with hypoxia, hypokalemia, hypomagnesemia, and anxiety. PMH: AVM, GERD, intermittent GI bleed.    PT Comments    Pt assisted to [facility] and then ambulated in hallway.  Pt feeling better today and able to progress ambulation distance.  Pt uncertain of RW at home so would recommend RW if pt does not have one, since pt heavily utilized RW during ambulation today.     Recommendations for follow up therapy are one component of a multi-disciplinary discharge planning process, led by the attending physician.  Recommendations may be updated based on patient status, additional functional criteria and insurance authorization.  Follow Up Recommendations       Assistance Recommended at Discharge Frequent or constant Supervision/Assistance  Patient can return home with the following A little help with walking and/or transfers;A little help with bathing/dressing/bathroom;Assistance with cooking/housework;Direct supervision/assist for medications management;Assist for transportation;Help with stairs or ramp for entrance   Equipment Recommendations  Rolling walker (2 wheels)    Recommendations for Other Services       Precautions / Restrictions Precautions Precautions: Fall Restrictions Weight Bearing Restrictions: Yes     Mobility  Bed Mobility               General bed mobility comments: pt in recliner    Transfers Overall transfer level: Needs assistance Equipment used: Rolling walker (2 wheels) Transfers: Sit to/from Stand, Bed to chair/wheelchair/BSC Sit to Stand: Min guard   Step pivot transfers: Min  guard       General transfer comment: pt reports needing to empty (due to bowel incontinence) prior to ambulating so provided BSC and pt able to take steps with RW over to [facility] with cues for hand placement to self assist    Ambulation/Gait Ambulation/Gait assistance: Min guard Gait Distance (Feet): 60 Feet Assistive device: Rolling walker (2 wheels) Gait Pattern/deviations: Step-through pattern, Decreased stride length, Trunk flexed, Drifts right/left Gait velocity: decreased     General Gait Details: pt reliant on UE support and utilizes RW, pt does tend to lean more to left and drift left however pt reports hx of CVA and states this is her baseline; no physical assist required however maintained min/guard for safety; SpO2 94% on room air per telemetry monitor during ambulation   Stairs             Wheelchair Mobility    Modified Rankin (Stroke Patients Only)       Balance Overall balance assessment: Mild deficits observed, not formally tested                                          Cognition Arousal/Alertness: Awake/alert Behavior During Therapy: WFL for tasks assessed/performed Overall Cognitive Status: Within Functional Limits for tasks assessed                                          Exercises      General Comments        Pertinent Vitals/Pain Pain Assessment Pain Assessment: No/denies  pain    Home Living                          Prior Function            PT Goals (current goals can now be found in the care plan section) Progress towards PT goals: Progressing toward goals    Frequency    Min 1X/week      PT Plan Current plan remains appropriate    Co-evaluation              AM-PAC PT "6 Clicks" Mobility   Outcome Measure  Help needed turning from your back to your side while in a flat bed without using bedrails?: A Little Help needed moving from lying on your back to sitting on  the side of a flat bed without using bedrails?: A Little Help needed moving to and from a bed to a chair (including a wheelchair)?: A Little Help needed standing up from a chair using your arms (e.g., wheelchair or bedside chair)?: A Little Help needed to walk in hospital room?: A Little Help needed climbing 3-5 steps with a railing? : A Lot 6 Click Score: 17    End of Session Equipment Utilized During Treatment: Gait belt Activity Tolerance: Patient tolerated treatment well Patient left: in bed;with call bell/phone within reach;with nursing/sitter in room Nurse Communication: Mobility status PT Visit Diagnosis: Unsteadiness on feet (R26.81);Difficulty in walking, not elsewhere classified (R26.2);Muscle weakness (generalized) (M62.81)     Time: 4098-1191 PT Time Calculation (min) (ACUTE ONLY): 18 min  Charges:  $Gait Training: 8-22 mins                    Paulino Door, DPT Physical Therapist Acute Rehabilitation Services Office: [phone]    [name] 01/28/2023, 2:12 PM

## 2023-01-28 NOTE — Plan of Care (Signed)
  Problem: Education: Goal: Ability to describe self-care measures that may prevent or decrease complications (Diabetes Survival Skills Education) will improve 01/28/2023 0407 by Benny Lennert, RN Outcome: Progressing 01/28/2023 0407 by Benny Lennert, RN Outcome: Progressing Goal: Individualized Educational Video(s) Outcome: Progressing   Problem: Coping: Goal: Ability to adjust to condition or change in health will improve Outcome: Progressing   Problem: Fluid Volume: Goal: Ability to maintain a balanced intake and output will improve Outcome: Progressing   Problem: Health Behavior/Discharge Planning: Goal: Ability to identify and utilize available resources and services will improve Outcome: Progressing Goal: Ability to manage health-related needs will improve Outcome: Progressing   Problem: Metabolic: Goal: Ability to maintain appropriate glucose levels will improve Outcome: Progressing   Problem: Nutritional: Goal: Maintenance of adequate nutrition will improve Outcome: Progressing Goal: Progress toward achieving an optimal weight will improve Outcome: Progressing   Problem: Skin Integrity: Goal: Risk for impaired skin integrity will decrease Outcome: Progressing   Problem: Tissue Perfusion: Goal: Adequacy of tissue perfusion will improve Outcome: Progressing   Problem: Education: Goal: Knowledge of General Education information will improve Description: Including pain rating scale, medication(s)/side effects and non-pharmacologic comfort measures Outcome: Progressing   Problem: Health Behavior/Discharge Planning: Goal: Ability to manage health-related needs will improve Outcome: Progressing   Problem: Clinical Measurements: Goal: Ability to maintain clinical measurements within normal limits will improve Outcome: Progressing Goal: Will remain free from infection Outcome: Progressing Goal: Diagnostic test results will improve Outcome: Progressing Goal:  Respiratory complications will improve Outcome: Progressing Goal: Cardiovascular complication will be avoided Outcome: Progressing   Problem: Activity: Goal: Risk for activity intolerance will decrease Outcome: Progressing   Problem: Nutrition: Goal: Adequate nutrition will be maintained Outcome: Progressing   Problem: Coping: Goal: Level of anxiety will decrease Outcome: Progressing   Problem: Elimination: Goal: Will not experience complications related to bowel motility Outcome: Progressing Goal: Will not experience complications related to urinary retention Outcome: Progressing   Problem: Pain Managment: Goal: General experience of comfort will improve Outcome: Progressing   Problem: Safety: Goal: Ability to remain free from injury will improve Outcome: Progressing   Problem: Skin Integrity: Goal: Risk for impaired skin integrity will decrease Outcome: Progressing

## 2023-01-28 NOTE — Discharge Summary (Signed)
Physician Discharge Summary  [name] ZOX:[id number] DOB: March 29, 1929 DOA: 01/17/2023  PCP: Christina Corn, MD  Admit date: 01/17/2023 Discharge date: 01/28/2023  Admitted From: Home Disposition: Home  Recommendations for Outpatient Follow-up:  Follow up with PCP in 1-2 weeks Follow-up with oncology as scheduled  Home Health: PT Equipment/Devices: Wheelchair  Discharge Condition: Stable CODE STATUS: Full Diet recommendation: Soft diet  Brief/Interim Summary: [name] is a 89 y.o. female with past medical history significant for intermittent GI bleed secondary to AVM, GERD, hypothyroidism who presented to [facility] ED on 5/8 with abdominal pain, distention, nausea/vomiting and bright red blood per rectum.  Family called her GI office and was encouraged to go to the ED for further evaluation.  Recently started on MiraLAX for fecal impaction now with with increased frequency of bowel movements as well as bright red blood per rectum with ongoing and worsening abdominal pain nausea and vomiting.  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  CT chest showed spiculated nodule right upper lobe, MRI of the pelvis showed large polypoid tumor of the distal rectum classified as T3b N1.  Initial discussion about possible bronchoscopy for pulmonary nodule evaluation 5/21 however per previous discussion with [name] bronchoscopy is not acutely necessary given diagnosis has already been obtained.  Tentative plan is discharged to [name] office for ongoing treatment discussion.  Will likely  ultimately need surgery but will defer to the outpatient setting.  There was discussion about port placement but family is deferring for now for further outpatient considerations.  Patient's only complaint now is ongoing loose stools which are likely secondary to increased bowel regimen and diet.  We discussed over time this should improve as we titrate her medications but in the interim she will likely continue to have somewhat loose stool due to partial obstruction and ongoing need for laxatives.   Discharge Diagnoses:  Principal Problem:   Hypokalemia Active Problems:   Symptomatic anemia   Hyperlipidemia   Anxiety   Stroke (HCC)   Hypothyroidism   Diabetes mellitus without complication (HCC)   GIB (gastrointestinal bleeding)   Acute respiratory failure with hypoxia (HCC)   Hypomagnesemia   Periumbilical hernia   Malnutrition of moderate degree   Rectal cancer (HCC)   Rectal bleeding   Paroxysmal atrial fibrillation (HCC)   Tachycardia-bradycardia syndrome (HCC)   Sinus node dysfunction (HCC)   Lung nodule  Partially obstructed: In the setting of newly diagnosed adenocarcinoma  Acute respiratory failure with hypoxia, not POA, resolved  Hypokalemia Hypomagnesemia Iron/B12 deficiency anemia Anxiety Paroxysmal atrial fibrillation with postconversion pauses Tachybradycardia syndrome History of GIB secondary to AVMs, STABLE  Hypothyroidism GERD  Discharge Instructions  Discharge Instructions     Amb Referral to Palliative Care   Complete by: As directed    Ambulatory referral to Hematology / Oncology   Complete by: As directed       Allergies as of 01/28/2023       Reactions   Prednisone    Climbs the walls        Medication List     STOP taking these medications    albuterol 108 (90 Base) MCG/ACT inhaler Commonly  known as: VENTOLIN HFA   Diclofenac Sodium CR 100 MG 24 hr tablet   ferrous sulfate 325 (65 FE) MG EC tablet   lidocaine 5 % Commonly  known as: Lidoderm   olmesartan-hydrochlorothiazide 40-25 MG tablet Commonly known as: BENICAR HCT       TAKE these medications    acetaminophen 325 MG tablet Commonly known as: TYLENOL Take 2 tablets (650 mg total) by mouth every 6 (six) hours as needed for mild pain (or Fever >/= 101).   ALPRAZolam 0.5 MG tablet Commonly known as: XANAX Take 0.5 mg by mouth 3 (three) times daily as needed for anxiety. For anxiety   amiodarone 400 MG tablet Commonly known as: PACERONE Take 1 tablet (400 mg total) by mouth daily.   amLODipine 2.5 MG tablet Commonly known as: NORVASC Take 1 tablet (2.5 mg total) by mouth daily. What changed: how much to take   atorvastatin 20 MG tablet Commonly known as: LIPITOR Take 20 mg by mouth daily.   cholecalciferol 25 MCG (1000 UNIT) tablet Commonly known as: VITAMIN D3 Take 1,000 Units by mouth daily.   cyanocobalamin 1000 MCG tablet Take 1 tablet (1,000 mcg total) by mouth daily.   feeding supplement Liqd Take 237 mLs by mouth 3 (three) times daily with meals.   fluticasone 50 MCG/ACT nasal spray Commonly known as: FLONASE Place 2 sprays into both nostrils daily.   guaiFENesin-dextromethorphan 100-10 MG/5ML syrup Commonly known as: ROBITUSSIN DM Take 10 mLs by mouth every 4 (four) hours as needed for cough.   HYDROcodone-acetaminophen 5-325 MG tablet Commonly known as: NORCO/VICODIN Take 1-2 tablets by mouth every 4 (four) hours as needed for moderate pain.   levothyroxine 125 MCG tablet Commonly known as: SYNTHROID Take 125 mcg by mouth daily before breakfast.   loratadine 10 MG tablet Commonly known as: CLARITIN Take 1 tablet (10 mg total) by mouth daily.   methocarbamol 500 MG tablet Commonly known as: ROBAXIN Take 1 tablet (500 mg total) by mouth every 8 (eight) hours as needed for muscle spasms (abd pain).   nystatin 100000 UNIT/ML suspension Commonly known as: MYCOSTATIN Take 5 mLs (500,000 Units total) by mouth 4  (four) times daily.   ondansetron 4 MG tablet Commonly known as: ZOFRAN Take 1 tablet (4 mg total) by mouth every 6 (six) hours as needed for nausea.   pantoprazole 40 MG tablet Commonly known as: Protonix Take 1 tablet (40 mg total) by mouth daily. What changed: Another medication with the same name was removed. Continue taking this medication, and follow the directions you see here.   polyethylene glycol 17 g packet Commonly known as: MIRALAX / GLYCOLAX Take 17 g by mouth daily as needed for mild constipation.   potassium chloride SA 20 MEQ tablet Commonly known as: KLOR-CON M Take 20 mEq by mouth daily.               Durable Medical Equipment  (From admission, onward)           Start     Ordered   01/28/23 1110  For home use only DME Walker  Once       Question:  Patient needs a walker to treat with the following condition  Answer:  Ambulatory dysfunction   01/28/23 1109            Follow-up Information     Tereso Newcomer T, PA-C Follow up.   Specialties: Cardiology, Physician Assistant Why: Hospital follow-up with Cardiology scheduled for 02/26/2023 at  10:05am. Please arrive 15 minutes early for check-in. If this day/ time does not work for you, please call our office to reschedule. Contact information: [address] [place] [address] [phone]         [facility] Follow up.   Contact information: [address] [place] [address] [phone]                Allergies  Allergen Reactions   Prednisone     Climbs the walls    Consultations: [place] gastroenterology General surgery Cardiology Medical oncology Pulmonology   Procedures/Studies: DG CHEST PORT 1 VIEW  Result Date: 01/26/2023 CLINICAL DATA:  [id number] with shortness of breath. EXAM: PORTABLE CHEST 1 VIEW COMPARISON:  Portable chest 01/24/2023 FINDINGS: 5:05 a.m. The lungs are emphysematous. There is increasing patchy  hazy opacity in the right lower lung field. Findings concerning for pneumonia. Small pleural effusions may have developed as well. The remaining lungs appear clear. There is aortic calcific plaque with stable mediastinum, normal cardiac size. There are no findings of acute CHF. There is osteopenia and thoracic spondylosis. IMPRESSION: 1. Increasing patchy hazy opacity in the right lower lung field concerning for pneumonia. Small pleural effusions may have developed as well. Follow-up study recommended with PA and lateral views. 2. Emphysema. 3. Aortic atherosclerosis. Electronically Signed   By: Almira Bar M.D.   On: 01/26/2023 07:29   DG CHEST PORT 1 VIEW  Result Date: 01/24/2023 CLINICAL DATA:  Shortness of breath EXAM: PORTABLE CHEST 1 VIEW COMPARISON:  01/17/2023 FINDINGS: Cardiac shadow is stable. Aortic calcifications are again seen. Lungs are well aerated bilaterally. Mild interstitial changes are noted which may be related to edema. No focal infiltrate or effusion is seen. IMPRESSION: Suggestion of mild interstitial edema. Electronically Signed   By: Alcide Clever M.D.   On: 01/24/2023 10:51   ECHOCARDIOGRAM COMPLETE  Result Date: 01/21/2023    ECHOCARDIOGRAM REPORT   Patient Name:   MATHILDE FILTZ Date of Exam: 01/21/2023 Medical Rec #:  [id number]     Height: Accession #:    [id number]    Weight: Date of Birth:  April 18, 1929    BSA: Patient Age:    89 years      BP:           135/53 mmHg Patient Gender: F             HR:           96 bpm. Exam Location:  Inpatient Procedure: 2D Echo, Cardiac Doppler and Color Doppler Indications:    Atrial Fibrillation  History:        Patient has prior history of Echocardiogram examinations, most                 recent 07/10/2017. Stroke, Arrythmias:Atrial Fibrillation; Risk                 Factors:HLD.  Sonographer:    Lucy Antigua Referring Phys: [phone] PHILIP J NAHSER IMPRESSIONS  1. Left ventricular ejection fraction, by estimation, is 60 to 65%. The left  ventricle has normal function. The left ventricle has no regional wall motion abnormalities. Left ventricular diastolic parameters are indeterminate.  2. Right ventricular systolic function is normal. The right ventricular size is normal. There is normal pulmonary artery systolic pressure.  3. The mitral valve is grossly normal. Mild mitral valve regurgitation.  4. The aortic valve is calcified. Aortic valve regurgitation is not visualized.  Mild aortic valve stenosis.  5. The inferior vena cava is normal in size with greater than 50% respiratory variability, suggesting right atrial pressure of 3 mmHg. FINDINGS  Left Ventricle: Left ventricular ejection fraction, by estimation, is 60 to 65%. The left ventricle has normal function. The left ventricle has no regional wall motion abnormalities. The left ventricular internal cavity size was normal in size. There is  no left ventricular hypertrophy. Left ventricular diastolic parameters are indeterminate. Right Ventricle: The right ventricular size is normal. Right ventricular systolic function is normal. There is normal pulmonary artery systolic pressure. The tricuspid regurgitant velocity is 2.67 m/s, and with an assumed right atrial pressure of 3 mmHg,  the estimated right ventricular systolic pressure is 31.5 mmHg. Left Atrium: Left atrial size was normal in size. Right Atrium: Right atrial size was normal in size. Pericardium: There is no evidence of pericardial effusion. Mitral Valve: The mitral valve is grossly normal. Mild mitral annular calcification. Mild mitral valve regurgitation. Tricuspid Valve: Tricuspid valve regurgitation is mild. Aortic Valve: The aortic valve is calcified. Aortic valve regurgitation is not visualized. Mild aortic stenosis is present. Aortic valve mean gradient measures 10.0 mmHg. Aortic valve peak gradient measures 19.7 mmHg. Aortic valve area, by VTI measures 0.91 cm. Pulmonic Valve: Pulmonic valve regurgitation is not visualized.  Aorta: The aortic root and ascending aorta are structurally normal, with no evidence of dilitation. Venous: The inferior vena cava is normal in size with greater than 50% respiratory variability, suggesting right atrial pressure of 3 mmHg. IAS/Shunts: No atrial level shunt detected by color flow Doppler.  LEFT VENTRICLE PLAX 2D LVIDd:         3.90 cm LVIDs:         2.60 cm LV PW:         0.90 cm LV IVS:        0.70 cm LVOT diam:     1.60 cm LV SV:         39 LVOT Area:     2.01 cm  LV Volumes (MOD) LV vol d, MOD A4C: 47.2 ml LV vol s, MOD A4C: 12.6 ml LV SV MOD A4C:     47.2 ml RIGHT VENTRICLE RV S prime:     15.80 cm/s TAPSE (M-mode): 1.7 cm LEFT ATRIUM             RIGHT ATRIUM LA Vol (A2C):   42.0 ml RA Area:     9.91 cm LA Vol (A4C):   33.9 ml RA Volume:   19.60 ml LA Biplane Vol: 41.6 ml  AORTIC VALVE AV Area (Vmax):    0.76 cm AV Area (Vmean):   0.83 cm AV Area (VTI):     0.91 cm AV Vmax:           222.00 cm/s AV Vmean:          146.000 cm/s AV VTI:            0.434 m AV Peak Grad:      19.7 mmHg AV Mean Grad:      10.0 mmHg LVOT Vmax:         84.30 cm/s LVOT Vmean:        60.100 cm/s LVOT VTI:          0.196 m LVOT/AV VTI ratio: 0.45  AORTA Ao Root diam: 3.10 cm Ao Asc diam:  2.60 cm MITRAL VALVE  TRICUSPID VALVE MV Area (PHT): 3.46 cm     TR Peak grad:   28.5 mmHg MV Decel Time: 219 msec     TR Vmax:        267.00 cm/s MR Peak grad: 141.6 mmHg MR Vmax:      595.00 cm/s   SHUNTS MV E velocity: 133.00 cm/s  Systemic VTI:  0.20 m MV A velocity: 79.80 cm/s   Systemic Diam: 1.60 cm MV E/A ratio:  1.67 [place] signed by Carolan Clines Signature Date/Time: 01/21/2023/1:40:22 PM    Final    MR PELVIS WO CM RECTAL CA STAGING  Result Date: 01/21/2023 CLINICAL DATA:  Potential rectal carcinoma. No rectal mass on colonoscopy. EXAM: MRI PELVIS WITHOUT CONTRAST TECHNIQUE: Multiplanar multisequence MR imaging of the pelvis was performed. No intravenous contrast was administered.  Ultrasound gel was administered per rectum to optimize tumor evaluation. COMPARISON:  CT 01/17/2023 FINDINGS: Patient was moving during exam. Best images obtained with this limitation TUMOR LOCATION Large polypoid tumor in the distal rectum along the RIGHT wall. Rectal mass mass extends from 12:00 to 6:00 (image 4/12). The tumor extends essentially to the internal anal sphincter. Tumor distance from Anal Verge/Skin surface: 3.5 cm Tumor distance to Internal Anal sphincter: 0mm TUMOR DESCRIPTION Circumferential extent: 180 degrees along the RIGHT margin from 12:00 to 6:00 (image 14/series 4 Tumor Size and volume: 4 cm in length T - CATEGORY Extension through Muscularis Propria: T3 B. Tumor extends through the muscularis propria approximately 5 mm (image 16/10). This extension is along the RIGHT margin approximately 3 o'clock. Shortest Distance of any tumor/node from Mesorectal fascia: 3 mm (image 15/series 10) Extramural Vascular Invasion/Tumor Thrombus: No Invasion of Anterior Peritoneal Reflection: No Involvement of Adjacent Organs or Pelvic Sidewall: No Levator Ani Involvement: No N - CATEGORY Mesorectal Lymph Nodes >=32mm: 6 mm lymph node in the LEFT mesorectum adjacent the tumor at 3 o'clock position (image 12/4 Extra-mesorectal Lymphadenopathy: No  Yes = N2 Other: None. IMPRESSION: 1. Exam limited by patient motion. 2. Rectal adenocarcinoma T stage: T3 B. 3. Rectal adenocarcinoma N stage: - N1. 4. Distance from tumor to the internal anal sphincter is 0 mm. Lesion in the distal RIGHT rectum extends to the internal anal sphincter. No clear invasion of the sphincter. Electronically Signed   By: Genevive Bi M.D.   On: 01/21/2023 12:33   CT CHEST W CONTRAST  Result Date: 01/20/2023 CLINICAL DATA:  Rectal cancer staging.  * Tracking Code: BO * EXAM: CT CHEST WITH CONTRAST TECHNIQUE: Multidetector CT imaging of the chest was performed during intravenous contrast administration. RADIATION DOSE REDUCTION: This  exam was performed according to the departmental dose-optimization program which includes automated exposure control, adjustment of the mA and/or kV according to patient size and/or use of iterative reconstruction technique. CONTRAST:  75mL OMNIPAQUE IOHEXOL 300 MG/ML  SOLN COMPARISON:  None Available. FINDINGS: Cardiovascular: Coronary artery calcification and aortic atherosclerotic calcification. Mediastinum/Nodes: No axillary or supraclavicular adenopathy. No mediastinal or hilar adenopathy. No pericardial fluid. Esophagus normal. Lungs/Pleura: Spiculated 15 mm nodule in the central RIGHT upper lobe (image 58/series 6). Bilateral pleural effusions with associated mild atelectasis at the lung bases. Upper Abdomen: Limited view of the liver, kidneys, pancreas are unremarkable. Normal adrenal glands. Musculoskeletal: No aggressive osseous lesion. IMPRESSION: 1. Spiculated nodule in the RIGHT upper lobe in patient with reported rectal carcinoma is concerning for metastatic lesion. 2. Coronary artery calcification and aortic atherosclerotic calcification. Electronically Signed   By: Loura Halt.D.  On: 01/20/2023 19:00   DG Chest Port 1 View  Result Date: 01/17/2023 CLINICAL DATA:  Chest pain EXAM: PORTABLE CHEST 1 VIEW COMPARISON:  12/05/2017 FINDINGS: Stable cardiomediastinal silhouette. Aortic atherosclerotic calcification. Diffuse interstitial coarsening greatest in the lower lungs. No focal consolidation, pleural effusion, pneumothorax. No definite displaced rib fractures. IMPRESSION: No active disease. Interstitial coarsening in the lower lungs is similar to 12/05/2017. Electronically Signed   By: Minerva Fester M.D.   On: 01/17/2023 21:24   CT ABDOMEN PELVIS W CONTRAST  Result Date: 01/17/2023 CLINICAL DATA:  Abdominal pain and distention. Side GI on Monday and was impacted and given laxatives. Has had multiple bowel movements since. EXAM: CT ABDOMEN AND PELVIS WITH CONTRAST TECHNIQUE:  Multidetector CT imaging of the abdomen and pelvis was performed using the standard protocol following bolus administration of intravenous contrast. RADIATION DOSE REDUCTION: This exam was performed according to the departmental dose-optimization program which includes automated exposure control, adjustment of the mA and/or kV according to patient size and/or use of iterative reconstruction technique. CONTRAST:  80mL OMNIPAQUE IOHEXOL 300 MG/ML  SOLN COMPARISON:  Abdominal ultrasound 07/12/2017 and report from CT abdomen and pelvis 01/20/2019 FINDINGS: Lower chest: No acute abnormality. Hepatobiliary: Cholecystectomy. No focal hepatic lesion. Post cholecystectomy prominence of the common bile duct. Pancreas: Unremarkable. Spleen: Unremarkable. Adrenals/Urinary Tract: Normal adrenal glands. No urinary calculi or hydronephrosis. Unremarkable bladder. Stomach/Bowel: Normal caliber large and small bowel. No bowel wall thickening. The appendix is not visualized. Stomach is within normal limits. Duodenal diverticulum. Vascular/Lymphatic: Aortic atherosclerotic calcification. Calcification at the origins of the celiac axis and SMA cause moderate narrowing. 1.9 cm left common iliac artery ectasia. No lymphadenopathy. Reproductive: Uterus and bilateral adnexa are unremarkable. Other: No free intraperitoneal fluid or air. Musculoskeletal: No acute fracture. IMPRESSION: 1. No acute findings in the abdomen or pelvis. Aortic Atherosclerosis (ICD10-I70.0). Electronically Signed   By: Minerva Fester M.D.   On: 01/17/2023 21:06     Subjective: No acute issues or events overnight continues to have loose to watery stool but generally improving, denies nausea vomiting headache fevers chills chest pain or shortness of breath.  Excited to be discharged home.   Discharge Exam: Vitals:   01/27/23 2011 01/28/23 0550  BP: (!) 163/57 (!) 142/54  Pulse: 73 63  Resp: 16 16  Temp: 98 F (36.7 C) 97.7 F (36.5 C)  SpO2: 94% 93%    Vitals:   01/27/23 1409 01/27/23 1954 01/27/23 2011 01/28/23 0550  BP: (!) 165/57  (!) 163/57 (!) 142/54  Pulse: 79  73 63  Resp: 19  16 16   Temp: 97.8 F (36.6 C)  98 F (36.7 C) 97.7 F (36.5 C)  TempSrc: Oral  Oral Oral  SpO2: 92% 90% 94% 93%  Weight:      Height:        General: Pt is alert, awake, not in acute distress Cardiovascular: RRR, S1/S2 +, no rubs, no gallops Respiratory: CTA bilaterally, no wheezing, no rhonchi Abdominal: Soft, NT, ND, bowel sounds + Extremities: no edema, no cyanosis   The results of significant diagnostics from this hospitalization (including imaging, microbiology, ancillary and laboratory) are listed below for reference.     Microbiology: No results found for this or any previous visit (from the past 240 hour(s)).   Labs: BNP (last 3 results) Recent Labs    01/26/23 0515  BNP 87.1   Basic Metabolic Panel: Recent Labs  Lab 01/22/23 0438 01/23/23 0539 01/25/23 1610 01/26/23 0515 01/27/23 0654 01/28/23 0502  NA 136 137 132* 137 135 136  K 3.2* 4.0 3.5 3.8 3.8 3.6  CL 99 101 94* 99 99 99  CO2 29 31 30  32 29 30  GLUCOSE 104* 120* 113* 94 94 99  BUN 10 12 16 20 14 14   CREATININE 0.55 0.68 0.75 0.72 0.69 0.62  CALCIUM 8.3* 8.5* 8.7* 9.0 8.7* 9.2  MG 1.8 1.9 1.7 1.9  --   --   PHOS  --   --   --  4.5  --   --    Liver Function Tests: Recent Labs  Lab 01/25/23 0508 01/26/23 0515 01/27/23 0654 01/28/23 0502  AST 20 17 16 16   ALT 30 24 20 21   ALKPHOS 66 55 54 55  BILITOT 0.7 0.8 0.8 0.8  PROT 6.5 6.0* 5.7* 6.2*  ALBUMIN 3.2* 3.1* 3.0* 3.2*   CBC: Recent Labs  Lab 01/23/23 0539 01/25/23 0508 01/26/23 0515 01/27/23 0654 01/28/23 0502  WBC 4.3 5.9 4.9 5.2 4.9  HGB 9.4* 10.4* 9.7* 9.7* 10.4*  HCT 30.4* 33.4* 31.5* 31.2* 33.9*  MCV 86.1 84.8 86.3 87.9 86.7  PLT 221 260 244 245 254   CBG: Recent Labs  Lab 01/27/23 1139 01/27/23 1607 01/27/23 2012 01/28/23 0739 01/28/23 1131  GLUCAP 125* 103* 148* 95 138*    Iron/TIBC/Ferritin/ %Sat    Component Value Date/Time   IRON 49 01/25/2023 0508   TIBC 356 01/25/2023 0508   FERRITIN 5 (L) 01/17/2023 2236   IRONPCTSAT 14 01/25/2023 0508    Urinalysis    Component Value Date/Time   COLORURINE STRAW (A) 01/17/2023 1940   APPEARANCEUR CLEAR 01/17/2023 1940   LABSPEC 1.006 01/17/2023 1940   PHURINE 7.0 01/17/2023 1940   GLUCOSEU NEGATIVE 01/17/2023 1940   HGBUR NEGATIVE 01/17/2023 1940   BILIRUBINUR NEGATIVE 01/17/2023 1940   KETONESUR NEGATIVE 01/17/2023 1940   PROTEINUR NEGATIVE 01/17/2023 1940   UROBILINOGEN 0.2 10/05/2008 1806   NITRITE NEGATIVE 01/17/2023 1940   LEUKOCYTESUR TRACE (A) 01/17/2023 1940   Sepsis Labs Recent Labs  Lab 01/25/23 0508 01/26/23 0515 01/27/23 0654 01/28/23 0502  WBC 5.9 4.9 5.2 4.9   Microbiology No results found for this or any previous visit (from the past 240 hour(s)).   Time coordinating discharge: Over 30 minutes  SIGNED:   Azucena Fallen, DO [facility] 01/28/2023, 11:57 AM Pager   If 7PM-7AM, please contact night-coverage [email or web address]

## 2023-01-29 ENCOUNTER — Encounter: Payer: Self-pay | Admitting: *Deleted

## 2023-01-29 ENCOUNTER — Telehealth: Payer: Self-pay

## 2023-01-29 ENCOUNTER — Telehealth: Payer: Self-pay | Admitting: Nurse Practitioner

## 2023-01-29 NOTE — Telephone Encounter (Signed)
Line busy

## 2023-01-29 NOTE — Telephone Encounter (Signed)
Inbound call from son, would like to speak to a nurse, states patient was discharged from the hospital, states she has rectal cancer and she is having incontinence, diarrhea. States she is having bowel movements every 10 minutes. He would like to discuss further plan of care and are anxiously awaiting a call.

## 2023-01-29 NOTE — Progress Notes (Signed)
Reached out to [name] to introduce myself as the office RN Navigator and explain our new patient process. Spoke to patient's son, [name]. Reviewed the reason for their referral and scheduled their new patient appointment along with labs. Provided address and directions to the office including call back phone number. Reviewed with patient any concerns they may have or any possible barriers to attending their appointment.   Informed patient about my role as a navigator and that I will meet with them prior to their New Patient appointment and more fully discuss what services I can provide. At this time patient has no further questions or needs.    Oncology Nurse Navigator Documentation     01/29/2023   11:00 AM  Oncology Nurse Navigator Flowsheets  Navigator Follow Up Date: 02/02/2023  Navigator Follow Up Reason: New Patient Appointment  Navigator Location [facility]  Navigator Encounter Type Introductory Phone Call  Patient Visit Type MedOnc  Treatment Phase Pre-Tx/Tx Discussion  Barriers/Navigation Needs Coordination of Care;Education  Education Other  Interventions Coordination of Care;Education  Acuity Level 2-Minimal Needs (1-2 Barriers Identified)  Coordination of Care Appts  Education Method Verbal  Support Groups/Services Friends and Family  Time Spent with Patient 30

## 2023-01-29 NOTE — Telephone Encounter (Signed)
Spoke to pt's son, [name], regarding previous 02/07/23 appt. Pt's son states they will call if this appt needs to be rescheduled.

## 2023-01-30 ENCOUNTER — Ambulatory Visit (HOSPITAL_COMMUNITY): Admit: 2023-01-30 | Payer: Medicare PPO | Admitting: Pulmonary Disease

## 2023-01-30 ENCOUNTER — Encounter (HOSPITAL_COMMUNITY): Admission: RE | Payer: Self-pay | Source: Home / Self Care

## 2023-01-30 ENCOUNTER — Ambulatory Visit (HOSPITAL_COMMUNITY): Admission: RE | Admit: 2023-01-30 | Payer: Medicare PPO | Source: Home / Self Care | Admitting: Pulmonary Disease

## 2023-01-30 DIAGNOSIS — E440 Moderate protein-calorie malnutrition: Secondary | ICD-10-CM | POA: Diagnosis not present

## 2023-01-30 DIAGNOSIS — K5909 Other constipation: Secondary | ICD-10-CM | POA: Diagnosis not present

## 2023-01-30 DIAGNOSIS — D509 Iron deficiency anemia, unspecified: Secondary | ICD-10-CM | POA: Diagnosis not present

## 2023-01-30 DIAGNOSIS — I480 Paroxysmal atrial fibrillation: Secondary | ICD-10-CM | POA: Diagnosis not present

## 2023-01-30 DIAGNOSIS — K429 Umbilical hernia without obstruction or gangrene: Secondary | ICD-10-CM | POA: Diagnosis not present

## 2023-01-30 DIAGNOSIS — K625 Hemorrhage of anus and rectum: Secondary | ICD-10-CM | POA: Diagnosis not present

## 2023-01-30 DIAGNOSIS — C20 Malignant neoplasm of rectum: Secondary | ICD-10-CM | POA: Diagnosis not present

## 2023-01-30 DIAGNOSIS — E119 Type 2 diabetes mellitus without complications: Secondary | ICD-10-CM | POA: Diagnosis not present

## 2023-01-30 DIAGNOSIS — I495 Sick sinus syndrome: Secondary | ICD-10-CM | POA: Diagnosis not present

## 2023-01-30 SURGERY — BRONCHOSCOPY, WITH BIOPSY USING ELECTROMAGNETIC NAVIGATION
Anesthesia: General

## 2023-02-01 NOTE — Telephone Encounter (Signed)
Pts son reports she is not taking miralax at all now.

## 2023-02-01 NOTE — Telephone Encounter (Signed)
Can try lomotil 1 tab [id number] Caution with this med, use judiciously Onc appt tomorrow where they should discuss management

## 2023-02-01 NOTE — Telephone Encounter (Signed)
Attempted to reach pt son line rings then goes busy.  Pt saw Dr Rhea Belton last for procedure on 5/10.

## 2023-02-01 NOTE — Telephone Encounter (Signed)
Spoke with pts son and he is aware of Dr. Lauro Franklin recommendations. He knows to keep the appt tomorrow. They declined the lomotil at this time and will discuss further management at the oncology appt tomorrow.

## 2023-02-01 NOTE — Telephone Encounter (Signed)
MiraLAX is recommended due to rectal cancer If she is using MiraLAX I would back off at this time and not take this for 1 to 2 days I would try to avoid Imodium as the rectal cancer is large and is partially obstructive Would try to make it to the oncology visit tomorrow If worsening or if he is fearful she is dehydrated in the ER would be the best option

## 2023-02-01 NOTE — Telephone Encounter (Signed)
Pts son states that pt still does not have much form to her stool, describes it as mushy. She is having greater than 5/day and he is concerned about her losing a lot of fluid. She has oncology appt tomorrow and is worried about getting her there. They have tried depends, went through a pack of 16 in 3 days. He would like some recommendations as to how to help with forming the stools. Please advise.

## 2023-02-02 ENCOUNTER — Inpatient Hospital Stay (HOSPITAL_BASED_OUTPATIENT_CLINIC_OR_DEPARTMENT_OTHER): Payer: Medicare PPO | Admitting: Hematology & Oncology

## 2023-02-02 ENCOUNTER — Inpatient Hospital Stay: Payer: Medicare PPO | Attending: Hematology & Oncology

## 2023-02-02 ENCOUNTER — Encounter: Payer: Self-pay | Admitting: Hematology & Oncology

## 2023-02-02 ENCOUNTER — Other Ambulatory Visit: Payer: Self-pay

## 2023-02-02 VITALS — BP 194/57 | HR 76 | Temp 97.6°F | Resp 19 | Ht 60.0 in | Wt 147.0 lb

## 2023-02-02 DIAGNOSIS — K625 Hemorrhage of anus and rectum: Secondary | ICD-10-CM | POA: Diagnosis not present

## 2023-02-02 DIAGNOSIS — Z5112 Encounter for antineoplastic immunotherapy: Secondary | ICD-10-CM | POA: Diagnosis not present

## 2023-02-02 DIAGNOSIS — Z79890 Hormone replacement therapy (postmenopausal): Secondary | ICD-10-CM | POA: Insufficient documentation

## 2023-02-02 DIAGNOSIS — Z79899 Other long term (current) drug therapy: Secondary | ICD-10-CM | POA: Diagnosis not present

## 2023-02-02 DIAGNOSIS — K5909 Other constipation: Secondary | ICD-10-CM | POA: Diagnosis not present

## 2023-02-02 DIAGNOSIS — E119 Type 2 diabetes mellitus without complications: Secondary | ICD-10-CM | POA: Diagnosis not present

## 2023-02-02 DIAGNOSIS — C20 Malignant neoplasm of rectum: Secondary | ICD-10-CM | POA: Insufficient documentation

## 2023-02-02 DIAGNOSIS — I4891 Unspecified atrial fibrillation: Secondary | ICD-10-CM | POA: Diagnosis not present

## 2023-02-02 DIAGNOSIS — D509 Iron deficiency anemia, unspecified: Secondary | ICD-10-CM | POA: Diagnosis not present

## 2023-02-02 DIAGNOSIS — I480 Paroxysmal atrial fibrillation: Secondary | ICD-10-CM | POA: Diagnosis not present

## 2023-02-02 DIAGNOSIS — E440 Moderate protein-calorie malnutrition: Secondary | ICD-10-CM | POA: Diagnosis not present

## 2023-02-02 DIAGNOSIS — Z1509 Genetic susceptibility to other malignant neoplasm: Secondary | ICD-10-CM

## 2023-02-02 DIAGNOSIS — I495 Sick sinus syndrome: Secondary | ICD-10-CM | POA: Diagnosis not present

## 2023-02-02 DIAGNOSIS — K429 Umbilical hernia without obstruction or gangrene: Secondary | ICD-10-CM | POA: Diagnosis not present

## 2023-02-02 HISTORY — DX: Genetic susceptibility to other malignant neoplasm: Z15.09

## 2023-02-02 LAB — PREALBUMIN: Prealbumin: 24 mg/dL (ref 18–38)

## 2023-02-02 LAB — CMP (CANCER CENTER ONLY)
ALT: 24 U/L (ref 0–44)
AST: 16 U/L (ref 15–41)
Albumin: 4.3 g/dL (ref 3.5–5.0)
Alkaline Phosphatase: 66 U/L (ref 38–126)
Anion gap: 6 (ref 5–15)
BUN: 16 mg/dL (ref 8–23)
CO2: 28 mmol/L (ref 22–32)
Calcium: 10.3 mg/dL (ref 8.9–10.3)
Chloride: 101 mmol/L (ref 98–111)
Creatinine: 0.8 mg/dL (ref 0.44–1.00)
GFR, Estimated: >60 mL/min (ref >60)
Glucose, Bld: 99 mg/dL (ref 70–99)
Potassium: 4.3 mmol/L (ref 3.5–5.1)
Sodium: 135 mmol/L (ref 135–145)
Total Bilirubin: 0.5 mg/dL (ref 0.3–1.2)
Total Protein: 6.9 g/dL (ref 6.5–8.1)

## 2023-02-02 LAB — CBC WITH DIFFERENTIAL (CANCER CENTER ONLY)
Abs Immature Granulocytes: 0.03 10*3/uL (ref 0.00–0.07)
Basophils Absolute: 0.1 10*3/uL (ref 0.0–0.1)
Basophils Relative: 1 %
Eosinophils Absolute: 0.3 10*3/uL (ref 0.0–0.5)
Eosinophils Relative: 4 %
HCT: 37 % (ref 36.0–46.0)
Hemoglobin: 11.7 g/dL — ABNORMAL LOW (ref 12.0–15.0)
Immature Granulocytes: 0 %
Lymphocytes Relative: 37 %
Lymphs Abs: 2.7 10*3/uL (ref 0.7–4.0)
MCH: 27.6 pg (ref 26.0–34.0)
MCHC: 31.6 g/dL (ref 30.0–36.0)
MCV: 87.3 fL (ref 80.0–100.0)
Monocytes Absolute: 0.6 10*3/uL (ref 0.1–1.0)
Monocytes Relative: 9 %
Neutro Abs: 3.6 10*3/uL (ref 1.7–7.7)
Neutrophils Relative %: 49 %
Platelet Count: 310 10*3/uL (ref 150–400)
RBC: 4.24 MIL/uL (ref 3.87–5.11)
RDW: 17.4 % — ABNORMAL HIGH (ref 11.5–15.5)
WBC Count: 7.3 10*3/uL (ref 4.0–10.5)
nRBC: 0 % (ref 0.0–0.2)

## 2023-02-02 LAB — CEA (IN HOUSE-CHCC): CEA (CHCC-In House): 2.83 ng/mL (ref 0.00–5.00)

## 2023-02-02 LAB — FERRITIN: Ferritin: 334 ng/mL — ABNORMAL HIGH (ref 11–307)

## 2023-02-02 LAB — LACTATE DEHYDROGENASE: LDH: 148 U/L (ref 98–192)

## 2023-02-02 NOTE — Progress Notes (Signed)
Hematology and Oncology Follow Up Visit  [name] [id number] Dec 05, 1928 89 y.o. 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 3 weeks-start cycle #1 on 02/09/2023     Interim History:  [name] is back for her first office visit.  I saw her in consultation at [facility].  This was back in May.  She had some hematochezia.  She had a endoscopy done.  This showed a mass in the rectum.  This was biopsied and found to be a moderately differentiated adenocarcinoma.  She had an MRI that was done of the pelvis.  This did show that there was some positive lymph nodes.  As well as she had a least a T3 rectal lesion.  She also was found on staging to have a solitary pulmonary nodule.  Molecular analysis of the tumor did show that there was a dMMR.  As such, it was felt that this was consistent with a Lynch syndrome.  Her son has metastatic colorectal cancer.  She was subsequently discharged.  She has atrial fibrillation.  She is on amiodarone.  She does have some thrush.  She is on nystatin.  This does not appear to be working.  Unfortunately, we really cannot use Diflucan because of the interaction with amiodarone.  She is eating okay.  There is no nausea or vomiting.  She is not constipated.  She has had no fever.  In the hospital, she did get some iron.  Given that there is this molecular abnormality, I thought that we could try her on immunotherapy.  Given that she is 89 years old, with other health issues, I would think that immunotherapy would be a reasonable way of trying to treat this.  She has not complained of any pain.  She is having no cough or shortness of breath.  Overall, I would have said that her performance status is probably ECOG 2.  Medications:  Current Outpatient Medications:    acetaminophen (TYLENOL) 325 MG tablet, Take 2 tablets (650 mg  total) by mouth every 6 (six) hours as needed for mild pain (or Fever >/= 101)., Disp: 30 tablet, Rfl: 0   ALPRAZolam (XANAX) 0.5 MG tablet, Take 0.5 mg by mouth 3 (three) times daily as needed for anxiety. For anxiety, Disp: , Rfl:    amiodarone (PACERONE) 400 MG tablet, Take 1 tablet (400 mg total) by mouth daily for 7 days., Disp: 7 tablet, Rfl: 0   amLODipine (NORVASC) 2.5 MG tablet, Take 1 tablet (2.5 mg total) by mouth daily., Disp: 15 tablet, Rfl: 0   atorvastatin (LIPITOR) 20 MG tablet, Take 20 mg by mouth daily., Disp: , Rfl:    cholecalciferol (VITAMIN D3) 25 MCG (1000 UNIT) tablet, Take 1,000 Units by mouth daily., Disp: , Rfl:    cyanocobalamin 1000 MCG tablet, Take 1 tablet (1,000 mcg total) by mouth daily., Disp: 30 tablet, Rfl: 0   feeding supplement (ENSURE ENLIVE / ENSURE PLUS) LIQD, Take 237 mLs by mouth 3 (three) times daily with meals., Disp: 237 mL, Rfl: 12   fluticasone (FLONASE) 50 MCG/ACT nasal spray, Place 2 sprays into both nostrils daily., Disp: 11.1 mL, Rfl: 0   guaiFENesin-dextromethorphan (ROBITUSSIN DM) 100-10 MG/5ML syrup, Take 10 mLs by mouth every 4 (four) hours as needed for cough., Disp: 118 mL, Rfl: 0   HYDROcodone-acetaminophen (NORCO/VICODIN) 5-325 MG tablet, Take 1-2 tablets by mouth every 4 (four) hours as  needed for moderate pain., Disp: 30 tablet, Rfl: 0   levothyroxine (SYNTHROID) 125 MCG tablet, Take 125 mcg by mouth daily before breakfast., Disp: , Rfl:    loratadine (CLARITIN) 10 MG tablet, Take 1 tablet (10 mg total) by mouth daily., Disp: 30 tablet, Rfl: 0   methocarbamol (ROBAXIN) 500 MG tablet, Take 1 tablet (500 mg total) by mouth every 8 (eight) hours as needed for muscle spasms (abd pain)., Disp: 10 tablet, Rfl: 0   nystatin (MYCOSTATIN) 100000 UNIT/ML suspension, Take 5 mLs (500,000 Units total) by mouth 4 (four) times daily., Disp: 60 mL, Rfl: 0   ondansetron (ZOFRAN) 4 MG tablet, Take 1 tablet (4 mg total) by mouth every 6 (six) hours as needed  for nausea., Disp: 20 tablet, Rfl: 0   polyethylene glycol (MIRALAX / GLYCOLAX) 17 g packet, Take 17 g by mouth daily as needed for mild constipation., Disp: 14 each, Rfl: 0   potassium chloride SA (K-DUR,KLOR-CON) 20 MEQ tablet, Take 20 mEq by mouth daily., Disp: , Rfl:    [START ON 02/05/2023] amiodarone (PACERONE) 200 MG tablet, Take 1 tablet (200 mg total) by mouth daily. DO NOT START THIS MEDICATION UNTIL PREVIOUS DOSE (400mg ) HAS BEEN COMPLETED (Patient not taking: Reported on 02/02/2023), Disp: 30 tablet, Rfl: 0   pantoprazole (PROTONIX) 40 MG tablet, Take 1 tablet (40 mg total) by mouth daily., Disp: 30 tablet, Rfl: 0  Allergies:  Allergies  Allergen Reactions   Prednisone Other (See Comments)    Climbs the walls    Past Medical History, Surgical history, Social history, and Family History were reviewed and updated.  Review of Systems: Review of Systems  Constitutional:  Positive for fatigue.  HENT:  Negative.    Eyes: Negative.   Respiratory: Negative.    Cardiovascular:  Positive for palpitations.  Gastrointestinal:  Positive for blood in stool and rectal pain.  Endocrine: Negative.   Genitourinary: Negative.    Musculoskeletal: Negative.   Skin: Negative.   Neurological:  Positive for light-headedness.  Hematological: Negative.   Psychiatric/Behavioral: Negative.      Physical Exam:  height is 5' (1.524 m) and weight is 147 lb (66.7 kg). Her oral temperature is 97.6 F (36.4 C). Her blood pressure is 194/57 (abnormal) and her pulse is 76. Her respiration is 19 and oxygen saturation is 95%.   Wt Readings from Last 3 Encounters:  02/02/23 147 lb (66.7 kg)  01/17/23 147 lb (66.7 kg)  01/15/23 147 lb (66.7 kg)    Physical Exam Vitals reviewed.  HENT:     Head: Normocephalic and atraumatic.  Eyes:     Pupils: Pupils are equal, round, and reactive to light.  Cardiovascular:     Rate and Rhythm: Normal rate and regular rhythm.     Heart sounds: Normal heart sounds.   Pulmonary:     Effort: Pulmonary effort is normal.     Breath sounds: Normal breath sounds.  Abdominal:     General: Bowel sounds are normal.     Palpations: Abdomen is soft.  Musculoskeletal:        General: No tenderness or deformity. Normal range of motion.     Cervical back: Normal range of motion.  Lymphadenopathy:     Cervical: No cervical adenopathy.  Skin:    General: Skin is warm and dry.     Findings: No erythema or rash.  Neurological:     Mental Status: She is alert and oriented to person, place, and time.  Psychiatric:  Behavior: Behavior normal.        Thought Content: Thought content normal.        Judgment: Judgment normal.     Lab Results  Component Value Date   WBC 7.3 02/02/2023   HGB 11.7 (L) 02/02/2023   HCT 37.0 02/02/2023   MCV 87.3 02/02/2023   PLT 310 02/02/2023     Chemistry      Component Value Date/Time   NA 135 02/02/2023 1345   K 4.3 02/02/2023 1345   CL 101 02/02/2023 1345   CO2 28 02/02/2023 1345   BUN 16 02/02/2023 1345   CREATININE 0.80 02/02/2023 1345      Component Value Date/Time   CALCIUM 10.3 02/02/2023 1345   ALKPHOS 66 02/02/2023 1345   AST 16 02/02/2023 1345   ALT 24 02/02/2023 1345   BILITOT 0.5 02/02/2023 1345      Impression and Plan: Ms. Bolash is a very charming 89 year old white female.  She has at least a locally advanced adenocarcinoma of the rectum.  This pulmonary nodule is in the right upper lobe.  It measures 15 mm.  Again, I would not imagine that she would be symptomatic from this.  Again I think that trying her on immunotherapy would be a reasonable way of trying to treat her.  I just think that she would have a very tough time with chemotherapy.  Before we got the result back from the molecular studies, I thought that we might consider single agent Xeloda.  However, I still think that immunotherapy would be a decent way of trying to help and seeing how she responds.  We will see about getting her  started next week.  Hopefully, she will not need to have a Port-A-Cath placed.  I know this might be a little bit tricky.  I am sure that our nurses will be able to place a peripheral IV for treatment.  If there are issues, then we can get a Port-A-Cath.  This is very complicated.  Again, with her age, we have to be careful with how we treat her and we do not want to have treatment cause toxicity for that would put her in the hospital.  We will go ahead and get treatment started on the 31st.  I will then plan to see her back for her second cycle in [place].  It  would be nice to try to give her 3 or 4 cycles and then repeat her scans.   Josph Macho, MD 5/24/20244:50 PM

## 2023-02-02 NOTE — Progress Notes (Signed)
START ON PATHWAY REGIMEN - Colorectal     A cycle is every 21 days:     Pembrolizumab   **Always confirm dose/schedule in your pharmacy ordering system**  Patient Characteristics: Distant Metastases, Nonsurgical Candidate, KRAS/NRAS Wild-Type (BRAF V600 Wild-Type/Unknown), Targeted/Immunotherapy (MSI-H/dMMR or HER2-Positive), MSI-H/dMMR Tumor Location: Rectal Therapeutic Status: Distant Metastases Microsatellite/Mismatch Repair Status: MSI-H/dMMR BRAF Mutation Status: Wild-Type (no mutation) KRAS/NRAS Mutation Status: Wild-Type (no mutation) Preferred Therapy Approach: Targeted/Immunotherapy (MSI-H/dMMR or  HER2-Positive) HER2 Status: Negative Intent of Therapy: Non-Curative / Palliative Intent, Discussed with Patient

## 2023-02-03 ENCOUNTER — Other Ambulatory Visit: Payer: Self-pay

## 2023-02-06 ENCOUNTER — Encounter: Payer: Self-pay | Admitting: *Deleted

## 2023-02-06 ENCOUNTER — Encounter: Payer: Self-pay | Admitting: Hematology & Oncology

## 2023-02-06 DIAGNOSIS — C20 Malignant neoplasm of rectum: Secondary | ICD-10-CM | POA: Diagnosis not present

## 2023-02-06 DIAGNOSIS — I495 Sick sinus syndrome: Secondary | ICD-10-CM | POA: Diagnosis not present

## 2023-02-06 DIAGNOSIS — K5909 Other constipation: Secondary | ICD-10-CM | POA: Diagnosis not present

## 2023-02-06 DIAGNOSIS — I480 Paroxysmal atrial fibrillation: Secondary | ICD-10-CM | POA: Diagnosis not present

## 2023-02-06 DIAGNOSIS — E119 Type 2 diabetes mellitus without complications: Secondary | ICD-10-CM | POA: Diagnosis not present

## 2023-02-06 DIAGNOSIS — D509 Iron deficiency anemia, unspecified: Secondary | ICD-10-CM | POA: Diagnosis not present

## 2023-02-06 DIAGNOSIS — K625 Hemorrhage of anus and rectum: Secondary | ICD-10-CM | POA: Diagnosis not present

## 2023-02-06 DIAGNOSIS — K429 Umbilical hernia without obstruction or gangrene: Secondary | ICD-10-CM | POA: Diagnosis not present

## 2023-02-06 DIAGNOSIS — E440 Moderate protein-calorie malnutrition: Secondary | ICD-10-CM | POA: Diagnosis not present

## 2023-02-06 LAB — IRON AND IRON BINDING CAPACITY (CC-WL,HP ONLY)
Iron: 69 ug/dL (ref 28–170)
Saturation Ratios: 19 % (ref 10.4–31.8)
TIBC: 358 ug/dL (ref 250–450)
UIBC: 289 ug/dL (ref 148–442)

## 2023-02-06 NOTE — Progress Notes (Signed)
Plan for patient to start immunotherapy on 02/09/2023. At this time we will not place a port. Due to difficulty with travel, we will do chemo education on the day of her treatment start.   Oncology Nurse Navigator Documentation     02/06/2023    7:45 AM  Oncology Nurse Navigator Flowsheets  Navigator Follow Up Date: 02/09/2023  Navigator Follow Up Reason: Chemotherapy  Navigator Location [facility]  Navigator Encounter Type Appt/Treatment Plan Review  Patient Visit Type MedOnc  Treatment Phase Pre-Tx/Tx Discussion  Barriers/Navigation Needs Coordination of Care;Education  Interventions Coordination of Care  Acuity Level 2-Minimal Needs (1-2 Barriers Identified)  Coordination of Care Appts  Support Groups/Services Friends and Family  Time Spent with Patient 15

## 2023-02-07 ENCOUNTER — Ambulatory Visit: Payer: Medicare PPO | Admitting: Hematology & Oncology

## 2023-02-07 ENCOUNTER — Inpatient Hospital Stay: Payer: Medicare PPO | Admitting: Oncology

## 2023-02-07 ENCOUNTER — Encounter: Payer: Self-pay | Admitting: Hematology & Oncology

## 2023-02-07 NOTE — Telephone Encounter (Signed)
Per Dr Myna Hidalgo: bring her in the office for an evaluation.   Spoke with son [name] (ok per DPR) and he stated that due to transportation he cannot get her here until after 3 today. Dr E is on call and may not be here at that time. Advised son of this and told him to go to the ER if she is having a lot of bleeding. He states she is not, the blood is only present when she wipes. He is concerned she may be constipated since she has not had a BM in a few days. Son scheduled apt for tomorrow, 02/08/23 @  9:30 AM for a lab apt then apt with Dr E after. Pt has a lab and infusion apt for 02/09/23. Will keep apt for now and see how she is tomorrow before deciding if she needs to r/s infusion apt or not.

## 2023-02-07 NOTE — Progress Notes (Unsigned)
Pharmacist Chemotherapy Monitoring - Initial Assessment    Anticipated start date: 02/09/23   The following has been reviewed per standard work regarding the patient's treatment regimen: The patient's diagnosis, treatment plan and drug doses, and organ/hematologic function Lab orders and baseline tests specific to treatment regimen  The treatment plan start date, drug sequencing, and pre-medications Prior authorization status  Patient's documented medication list, including drug-drug interaction screen and prescriptions for anti-emetics and supportive care specific to the treatment regimen The drug concentrations, fluid compatibility, administration routes, and timing of the medications to be used The patient's access for treatment and lifetime cumulative dose history, if applicable  The patient's medication allergies and previous infusion related reactions, if applicable   Changes made to treatment plan:  N/A  Follow up needed:  N/A   Candelaria Stagers, [facility], 02/07/2023  12:04 PM

## 2023-02-08 ENCOUNTER — Encounter: Payer: Self-pay | Admitting: Hematology & Oncology

## 2023-02-08 ENCOUNTER — Inpatient Hospital Stay: Payer: Medicare PPO

## 2023-02-08 ENCOUNTER — Encounter: Payer: Self-pay | Admitting: *Deleted

## 2023-02-08 ENCOUNTER — Other Ambulatory Visit: Payer: Self-pay

## 2023-02-08 ENCOUNTER — Inpatient Hospital Stay (HOSPITAL_BASED_OUTPATIENT_CLINIC_OR_DEPARTMENT_OTHER): Payer: Medicare PPO | Admitting: Hematology & Oncology

## 2023-02-08 VITALS — BP 186/67 | HR 80 | Temp 97.8°F | Resp 19 | Ht 60.0 in | Wt 145.0 lb

## 2023-02-08 VITALS — BP 165/48 | HR 66

## 2023-02-08 DIAGNOSIS — Z1509 Genetic susceptibility to other malignant neoplasm: Secondary | ICD-10-CM

## 2023-02-08 DIAGNOSIS — Z5112 Encounter for antineoplastic immunotherapy: Secondary | ICD-10-CM | POA: Diagnosis not present

## 2023-02-08 DIAGNOSIS — C20 Malignant neoplasm of rectum: Secondary | ICD-10-CM

## 2023-02-08 DIAGNOSIS — Z79899 Other long term (current) drug therapy: Secondary | ICD-10-CM | POA: Diagnosis not present

## 2023-02-08 DIAGNOSIS — Z79890 Hormone replacement therapy (postmenopausal): Secondary | ICD-10-CM | POA: Diagnosis not present

## 2023-02-08 DIAGNOSIS — I4891 Unspecified atrial fibrillation: Secondary | ICD-10-CM | POA: Diagnosis not present

## 2023-02-08 LAB — CBC WITH DIFFERENTIAL (CANCER CENTER ONLY)
Abs Immature Granulocytes: 0.02 10*3/uL (ref 0.00–0.07)
Basophils Absolute: 0.1 10*3/uL (ref 0.0–0.1)
Basophils Relative: 1 %
Eosinophils Absolute: 0.2 10*3/uL (ref 0.0–0.5)
Eosinophils Relative: 3 %
HCT: 38 % (ref 36.0–46.0)
Hemoglobin: 12.3 g/dL (ref 12.0–15.0)
Immature Granulocytes: 0 %
Lymphocytes Relative: 36 %
Lymphs Abs: 2.4 10*3/uL (ref 0.7–4.0)
MCH: 28.2 pg (ref 26.0–34.0)
MCHC: 32.4 g/dL (ref 30.0–36.0)
MCV: 87.2 fL (ref 80.0–100.0)
Monocytes Absolute: 0.6 10*3/uL (ref 0.1–1.0)
Monocytes Relative: 9 %
Neutro Abs: 3.4 10*3/uL (ref 1.7–7.7)
Neutrophils Relative %: 51 %
Platelet Count: 312 10*3/uL (ref 150–400)
RBC: 4.36 MIL/uL (ref 3.87–5.11)
RDW: 17.6 % — ABNORMAL HIGH (ref 11.5–15.5)
WBC Count: 6.7 10*3/uL (ref 4.0–10.5)
nRBC: 0 % (ref 0.0–0.2)

## 2023-02-08 LAB — IRON AND IRON BINDING CAPACITY (CC-WL,HP ONLY)
Iron: 66 ug/dL (ref 28–170)
Saturation Ratios: 18 % (ref 10.4–31.8)
TIBC: 368 ug/dL (ref 250–450)
UIBC: 302 ug/dL (ref 148–442)

## 2023-02-08 LAB — CMP (CANCER CENTER ONLY)
ALT: 19 U/L (ref 0–44)
AST: 18 U/L (ref 15–41)
Albumin: 4.6 g/dL (ref 3.5–5.0)
Alkaline Phosphatase: 66 U/L (ref 38–126)
Anion gap: 8 (ref 5–15)
BUN: 15 mg/dL (ref 8–23)
CO2: 31 mmol/L (ref 22–32)
Calcium: 10.8 mg/dL — ABNORMAL HIGH (ref 8.9–10.3)
Chloride: 95 mmol/L — ABNORMAL LOW (ref 98–111)
Creatinine: 0.85 mg/dL (ref 0.44–1.00)
GFR, Estimated: >60 mL/min (ref >60)
Glucose, Bld: 131 mg/dL — ABNORMAL HIGH (ref 70–99)
Potassium: 4 mmol/L (ref 3.5–5.1)
Sodium: 134 mmol/L — ABNORMAL LOW (ref 135–145)
Total Bilirubin: 0.6 mg/dL (ref 0.3–1.2)
Total Protein: 7.8 g/dL (ref 6.5–8.1)

## 2023-02-08 LAB — LACTATE DEHYDROGENASE: LDH: 160 U/L (ref 98–192)

## 2023-02-08 LAB — FERRITIN: Ferritin: 224 ng/mL (ref 11–307)

## 2023-02-08 LAB — TSH: TSH: 8.627 u[IU]/mL — ABNORMAL HIGH (ref 0.350–4.500)

## 2023-02-08 LAB — CEA (IN HOUSE-CHCC): CEA (CHCC-In House): 3.37 ng/mL (ref 0.00–5.00)

## 2023-02-08 MED ORDER — CAPECITABINE 500 MG PO TABS
1500.0000 mg | ORAL_TABLET | Freq: Two times a day (BID) | ORAL | 5 refills | Status: DC
  Filled 2023-02-08: qty 84, 14d supply, fill #0

## 2023-02-08 MED ORDER — SODIUM CHLORIDE 0.9 % IV SOLN: Freq: Once | INTRAVENOUS | Status: DC

## 2023-02-08 MED ORDER — LIDOCAINE-PRILOCAINE 2.5-2.5 % EX CREA
TOPICAL_CREAM | CUTANEOUS | 3 refills | Status: DC
Start: 2023-02-08 — End: 2023-04-12

## 2023-02-08 MED ORDER — SODIUM CHLORIDE 0.9 % IV SOLN
200.0000 mg | Freq: Once | INTRAVENOUS | Status: AC
  Administered 2023-02-08: 200 mg via INTRAVENOUS
  Filled 2023-02-08: qty 8

## 2023-02-08 MED ORDER — SODIUM CHLORIDE 0.9 % IV SOLN: INTRAVENOUS | Status: DC

## 2023-02-08 MED ORDER — PROCHLORPERAZINE MALEATE 10 MG PO TABS
10.0000 mg | ORAL_TABLET | Freq: Four times a day (QID) | ORAL | 1 refills | Status: DC | PRN
Start: 2023-02-08 — End: 2023-04-12

## 2023-02-08 MED ORDER — ONDANSETRON HCL 8 MG PO TABS
8.0000 mg | ORAL_TABLET | Freq: Three times a day (TID) | ORAL | 1 refills | Status: DC | PRN
Start: 2023-02-08 — End: 2023-02-26

## 2023-02-08 NOTE — Progress Notes (Signed)
Hematology and Oncology Follow Up Visit  Charelle R Underwood [id number] August 06, 1929 89 y.o. 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 3 weeks-start cycle #1 on 02/09/2023 Xeloda 1500 mg po BID (14/7) -- start cycle #1 on 02/13/2023     Interim History:  Ms. Ratzloff is back for follow-up.  She been having some blood per rectum.  I do worry about this.  I saw a picture that she had on her cell phone.  The blood was mostly coagulated.  Her hemoglobin is better actually.  I am sure this is probably from the iron that we have given her.  I think that we will probably going to have to add Xeloda to the pembrolizumab.  I does think that we need to get a more rapid response to the rectal tumor.  I think this would be reasonable.  I think the Xeloda would be tolerated.  She not complain of any pain in the rectal area.  She is constipated.  I told her to try 1 tablespoon of olive oil a day.  I also told her to do MiraLAX twice a day.  Hopefully, this will get her going so she would not be constipated.  Having the constipation clearly will increase her risk of hematochezia.  She has had no cough or shortness of breath.  Her appetite is still pretty decent.  She has had no nausea or vomiting.  She had there has been no headache.  She has had no leg swelling.  Overall, I would have to say that her performance status is probably ECOG 1-2.     Medications:  Current Outpatient Medications:    acetaminophen (TYLENOL) 325 MG tablet, Take 2 tablets (650 mg total) by mouth every 6 (six) hours as needed for mild pain (or Fever >/= 101)., Disp: 30 tablet, Rfl: 0   ALPRAZolam (XANAX) 0.5 MG tablet, Take 0.5 mg by mouth 3 (three) times daily as needed for anxiety. For anxiety, Disp: , Rfl:    amiodarone (PACERONE) 200 MG tablet, Take 1 tablet (200 mg total) by mouth daily. DO NOT START THIS MEDICATION  UNTIL PREVIOUS DOSE (400mg ) HAS BEEN COMPLETED (Patient not taking: Reported on 02/02/2023), Disp: 30 tablet, Rfl: 0   amiodarone (PACERONE) 400 MG tablet, Take 1 tablet (400 mg total) by mouth daily for 7 days., Disp: 7 tablet, Rfl: 0   amLODipine (NORVASC) 2.5 MG tablet, Take 1 tablet (2.5 mg total) by mouth daily., Disp: 15 tablet, Rfl: 0   atorvastatin (LIPITOR) 20 MG tablet, Take 20 mg by mouth daily., Disp: , Rfl:    cholecalciferol (VITAMIN D3) 25 MCG (1000 UNIT) tablet, Take 1,000 Units by mouth daily., Disp: , Rfl:    cyanocobalamin 1000 MCG tablet, Take 1 tablet (1,000 mcg total) by mouth daily., Disp: 30 tablet, Rfl: 0   feeding supplement (ENSURE ENLIVE / ENSURE PLUS) LIQD, Take 237 mLs by mouth 3 (three) times daily with meals., Disp: 237 mL, Rfl: 12   fluticasone (FLONASE) 50 MCG/ACT nasal spray, Place 2 sprays into both nostrils daily., Disp: 11.1 mL, Rfl: 0   guaiFENesin-dextromethorphan (ROBITUSSIN DM) 100-10 MG/5ML syrup, Take 10 mLs by mouth every 4 (four) hours as needed for cough., Disp: 118 mL, Rfl: 0   HYDROcodone-acetaminophen (NORCO/VICODIN) 5-325 MG tablet, Take 1-2 tablets by mouth every 4 (four) hours as needed for moderate pain., Disp: 30 tablet, Rfl: 0   levothyroxine (SYNTHROID)  125 MCG tablet, Take 125 mcg by mouth daily before breakfast., Disp: , Rfl:    lidocaine-prilocaine (EMLA) cream, Apply to affected area once, Disp: 30 g, Rfl: 3   loratadine (CLARITIN) 10 MG tablet, Take 1 tablet (10 mg total) by mouth daily., Disp: 30 tablet, Rfl: 0   methocarbamol (ROBAXIN) 500 MG tablet, Take 1 tablet (500 mg total) by mouth every 8 (eight) hours as needed for muscle spasms (abd pain)., Disp: 10 tablet, Rfl: 0   ondansetron (ZOFRAN) 4 MG tablet, Take 1 tablet (4 mg total) by mouth every 6 (six) hours as needed for nausea., Disp: 20 tablet, Rfl: 0   ondansetron (ZOFRAN) 8 MG tablet, Take 1 tablet (8 mg total) by mouth every 8 (eight) hours as needed for nausea or vomiting.,  Disp: 30 tablet, Rfl: 1   pantoprazole (PROTONIX) 40 MG tablet, Take 1 tablet (40 mg total) by mouth daily., Disp: 30 tablet, Rfl: 0   polyethylene glycol (MIRALAX / GLYCOLAX) 17 g packet, Take 17 g by mouth daily as needed for mild constipation., Disp: 14 each, Rfl: 0   potassium chloride SA (K-DUR,KLOR-CON) 20 MEQ tablet, Take 20 mEq by mouth daily., Disp: , Rfl:    prochlorperazine (COMPAZINE) 10 MG tablet, Take 1 tablet (10 mg total) by mouth every 6 (six) hours as needed for nausea or vomiting., Disp: 30 tablet, Rfl: 1 No current facility-administered medications for this visit.  Facility-Administered Medications Ordered in Other Visits:    0.9 %  sodium chloride infusion, , Intravenous, Continuous, Rhilynn Preyer, Rose Phi, MD, Stopped at 02/08/23 1125   0.9 %  sodium chloride infusion, , Intravenous, Once, Marquis Down, Rose Phi, MD  Allergies:  Allergies  Allergen Reactions   Prednisone Other (See Comments)    Climbs the walls    Past Medical History, Surgical history, Social history, and Family History were reviewed and updated.  Review of Systems: Review of Systems  Constitutional:  Positive for fatigue.  HENT:  Negative.    Eyes: Negative.   Respiratory: Negative.    Cardiovascular:  Positive for palpitations.  Gastrointestinal:  Positive for blood in stool and rectal pain.  Endocrine: Negative.   Genitourinary: Negative.    Musculoskeletal: Negative.   Skin: Negative.   Neurological:  Positive for light-headedness.  Hematological: Negative.   Psychiatric/Behavioral: Negative.      Physical Exam:  height is 5' (1.524 m) and weight is 145 lb (65.8 kg). Her oral temperature is 97.8 F (36.6 C). Her blood pressure is 186/67 (abnormal) and her pulse is 80. Her respiration is 19 and oxygen saturation is 95%.   Wt Readings from Last 3 Encounters:  02/08/23 145 lb (65.8 kg)  02/02/23 147 lb (66.7 kg)  01/17/23 147 lb (66.7 kg)    Physical Exam Vitals reviewed.  HENT:     Head:  Normocephalic and atraumatic.  Eyes:     Pupils: Pupils are equal, round, and reactive to light.  Cardiovascular:     Rate and Rhythm: Normal rate and regular rhythm.     Heart sounds: Normal heart sounds.  Pulmonary:     Effort: Pulmonary effort is normal.     Breath sounds: Normal breath sounds.  Abdominal:     General: Bowel sounds are normal.     Palpations: Abdomen is soft.  Musculoskeletal:        General: No tenderness or deformity. Normal range of motion.     Cervical back: Normal range of motion.  Lymphadenopathy:  Cervical: No cervical adenopathy.  Skin:    General: Skin is warm and dry.     Findings: No erythema or rash.  Neurological:     Mental Status: She is alert and oriented to person, place, and time.  Psychiatric:        Behavior: Behavior normal.        Thought Content: Thought content normal.        Judgment: Judgment normal.     Lab Results  Component Value Date   WBC 6.7 02/08/2023   HGB 12.3 02/08/2023   HCT 38.0 02/08/2023   MCV 87.2 02/08/2023   PLT 312 02/08/2023     Chemistry      Component Value Date/Time   NA 134 (L) 02/08/2023 0923   K 4.0 02/08/2023 0923   CL 95 (L) 02/08/2023 0923   CO2 31 02/08/2023 0923   BUN 15 02/08/2023 0923   CREATININE 0.85 02/08/2023 0923      Component Value Date/Time   CALCIUM 10.8 (H) 02/08/2023 0923   ALKPHOS 66 02/08/2023 0923   AST 18 02/08/2023 0923   ALT 19 02/08/2023 0923   BILITOT 0.6 02/08/2023 0923      Impression and Plan: Ms. Bonder is a very charming 89 year old white female.  She has at least a locally advanced adenocarcinoma of the rectum.  This pulmonary nodule is in the right upper lobe.  It measures 15 mm.  Again, I would not imagine that she would be symptomatic from this.  Will go ahead with her first cycle of pembrolizumab.  I will go ahead and send in Xeloda.  I think that 1500 mg p.o. twice daily would be appropriate for her.  I just do not believe that she is a good  candidate for full dose Xeloda.  I did speak to her son by phone.  I explained to him what our plan is.  He is understanding.  He is actually being treated by Dr. Truett Perna for metastatic colon cancer.  Her iron studies today show ferritin of 224 with an iron saturation of 18%.  As such, she seems to be holding pretty steady.  We will try to get the Xeloda started next week.  I know that given her age and overall performance status, this is going to be somewhat tricky.  We will have to watch her closely.  We will plan to have her come back so we can see her in another couple weeks.    Josph Macho, MD 5/30/20245:15 PM

## 2023-02-08 NOTE — Patient Instructions (Signed)
[facility]  Discharge Instructions: Thank you for choosing [facility] to provide your oncology and hematology care.   If you have a lab appointment with the Cancer Center, please go directly to the Cancer Center and check in at the registration area.  Wear comfortable clothing and clothing appropriate for easy access to any Portacath or PICC line.   We strive to give you quality time with your provider. You may need to reschedule your appointment if you arrive late (15 or more minutes).  Arriving late affects you and other patients whose appointments are after yours.  Also, if you miss three or more appointments without notifying the office, you may be dismissed from the clinic at the provider's discretion.      For prescription refill requests, have your pharmacy contact our office and allow 72 hours for refills to be completed.    Today you received the following chemotherapy and/or immunotherapy agents Keytruda      To help prevent nausea and vomiting after your treatment, we encourage you to take your nausea medication as directed.  BELOW ARE SYMPTOMS THAT SHOULD BE REPORTED IMMEDIATELY: *FEVER GREATER THAN 100.4 F (38 C) OR HIGHER *CHILLS OR SWEATING *NAUSEA AND VOMITING THAT IS NOT CONTROLLED WITH YOUR NAUSEA MEDICATION *UNUSUAL SHORTNESS OF BREATH *UNUSUAL BRUISING OR BLEEDING *URINARY PROBLEMS (pain or burning when urinating, or frequent urination) *BOWEL PROBLEMS (unusual diarrhea, constipation, pain near the anus) TENDERNESS IN MOUTH AND THROAT WITH OR WITHOUT PRESENCE OF ULCERS (sore throat, sores in mouth, or a toothache) UNUSUAL RASH, SWELLING OR PAIN  UNUSUAL VAGINAL DISCHARGE OR ITCHING   Items with * indicate a potential emergency and should be followed up as soon as possible or go to the Emergency Department if any problems should occur.  Please show the CHEMOTHERAPY ALERT CARD or IMMUNOTHERAPY ALERT CARD at check-in  to the Emergency Department and triage nurse. Should you have questions after your visit or need to cancel or reschedule your appointment, please contact [facility]  [phone] and follow the prompts.  Office hours are 8:00 a.m. to 4:30 p.m. Monday - Friday. Please note that voicemails left after 4:00 p.m. may not be returned until the following business day.  We are closed weekends and major holidays. You have access to a nurse at all times for urgent questions. Please call the main number to the clinic [phone] and follow the prompts.  For any non-urgent questions, you may also contact your provider using MyChart. We now offer e-Visits for anyone 18 and older to request care online for non-urgent symptoms. For details visit [email or web address].   Also download the MyChart app! Go to the app store, search "MyChart", open the app, select [facility], and log in with your MyChart username and password.   

## 2023-02-08 NOTE — Progress Notes (Signed)
Patient came in today for unscheduled visit due to dehydration, bleeding from rectum and constipation. She will receive symptom management, but also initiate her treatment today, as she was scheduled to start tomorrow.   Oncology Nurse Navigator Documentation     02/08/2023   10:15 AM  Oncology Nurse Navigator Flowsheets  Phase of Treatment Chemo  Chemotherapy Actual Start Date: 02/08/2023  Navigator Follow Up Date: 02/23/2023  Navigator Follow Up Reason: Follow-up Appointment;Chemotherapy  Navigator Location [facility]  Navigator Encounter Type Treatment;Appt/Treatment Plan Review  Treatment Initiated Date 02/08/2023  Patient Visit Type MedOnc  Treatment Phase First Chemo Tx  Barriers/Navigation Needs Coordination of Care;Education  Education Other  Interventions Psycho-Social Support  Acuity Level 2-Minimal Needs (1-2 Barriers Identified)  Education Method Verbal  Support Groups/Services Friends and Family  Time Spent with Patient 15

## 2023-02-08 NOTE — Telephone Encounter (Signed)
[name] is not yet a Pt with [place]. She was referred to Christina Newcomer, PA from [facility] upon being discharged on 5/19.  Called [name] regarding MyChart message (see message). [name] gave verbal permission to speak with her son, [name].  [name] stated he was concerned about the "top number" being high and wanted to know if it was from her medications. [name] could not give me BP readings. Said the top number ranged from 160 to 190.  [name] stated [name] was having no dizziness, headache, light headed, nor any other symptoms.  [name] had a appointment with oncology this morning and per [facility] Vitals encounters BP was 186/67 and was instructed to see her PCP. (Summary report was not available at time of this call) [name] stated he was unaware of that advise.   [name] was instructed to call PCP Christina Ruiz Russo,MD) after hanging up phone with me and if [name] developments any symptoms to take her to the ED or call 911. Also instructed [name] to take [name] BP everyday until she sees [name], [place] on [name] (and write them down) about 2 hours after she takes her medication. Instructed [name] to bring those BP readings to appointment for review. [name] stated his understanding.

## 2023-02-09 ENCOUNTER — Telehealth: Payer: Self-pay | Admitting: Pharmacist

## 2023-02-09 ENCOUNTER — Other Ambulatory Visit: Payer: Self-pay

## 2023-02-09 ENCOUNTER — Other Ambulatory Visit (HOSPITAL_COMMUNITY): Payer: Self-pay

## 2023-02-09 ENCOUNTER — Telehealth: Payer: Self-pay

## 2023-02-09 ENCOUNTER — Inpatient Hospital Stay: Payer: Medicare PPO

## 2023-02-09 ENCOUNTER — Encounter: Payer: Self-pay | Admitting: Hematology & Oncology

## 2023-02-09 DIAGNOSIS — C20 Malignant neoplasm of rectum: Secondary | ICD-10-CM

## 2023-02-09 MED ORDER — CAPECITABINE 500 MG PO TABS
1500.0000 mg | ORAL_TABLET | Freq: Two times a day (BID) | ORAL | 5 refills | Status: DC
Start: 2023-02-09 — End: 2023-07-04
  Filled 2023-02-09: qty 84, 14d supply, fill #0
  Filled 2023-02-09: qty 84, 21d supply, fill #0
  Filled 2023-02-21: qty 84, 21d supply, fill #1
  Filled 2023-03-19 (×2): qty 84, 21d supply, fill #2
  Filled 2023-04-13: qty 84, 21d supply, fill #3
  Filled 2023-05-04: qty 84, 21d supply, fill #4
  Filled 2023-06-15: qty 84, 21d supply, fill #5

## 2023-02-09 NOTE — Telephone Encounter (Signed)
Oral Oncology Patient Advocate Encounter  Prior Authorization for Capecitabine has been approved.    PA# [id number]  Effective dates: 02/09/23 through 09/11/23  Patients co-pay is $0.00.    Ardeen Fillers, CPhT Oncology Pharmacy Patient Advocate  [facility]  [phone] (phone) [phone] (fax) 02/09/2023 9:44 AM

## 2023-02-09 NOTE — Telephone Encounter (Signed)
Followed up on phone call from yesterday. [name] ([name] son) did follow up with PCP Creola Corn, MD) and they changed her amlodipine from 2.5 mg to 5 mg.

## 2023-02-09 NOTE — Telephone Encounter (Signed)
It looks like we saw her in the hospital for atrial fibrillation and post termination pauses. She was discharged on Amlodipine 2.5 mg once daily.  Agree with reaching out to PCP first for hypertension management. However, if the patient cannot reach her PCP for advisement, I recommend that she increase her Amlodipine to 5 mg once daily.  Tereso Newcomer, PA-C    02/09/2023 12:18 PM

## 2023-02-09 NOTE — Telephone Encounter (Signed)
Oral Oncology Patient Advocate Encounter  New authorization   Received notification that prior authorization for Capecitabine is required.   PA submitted on 02/09/23  Key [id number]  Status is pending     Ardeen Fillers, CPhT Oncology Pharmacy Patient Advocate  [facility]  [phone] (phone) [phone] (fax) 02/09/2023 8:49 AM

## 2023-02-09 NOTE — Telephone Encounter (Signed)
Patient successfully OnBoarded and drug education provided by pharmacist. Medication scheduled to be shipped on 02/12/23 for delivery on 02/13/23 from [facility] to patient's address. Patient and patient's son, [name], also know to call me at [phone] with any questions or concerns regarding receiving medication or if there is any unexpected change in co-pay.    Ardeen Fillers, CPhT Oncology Pharmacy Patient Advocate  [facility]  [phone] (phone) [phone] (fax) 02/09/2023 11:06 AM

## 2023-02-09 NOTE — Telephone Encounter (Signed)
Oral Oncology Pharmacist Encounter  Received new prescription for Xeloda (capecitabine) for the treatment of rectal cancer in conjunction with pembrolizumab, planned duration until disease progression or unacceptable drug toxicity.  CBC w/ Diff and CMP from 02/08/23 assessed, no baseline dose adjustments required at this time. Prescription dose and frequency assessed for appropriateness.  Current medication list in Epic reviewed, DDIs with Xeloda identified: Category D drug-drug interaction between Xeloda and Amiodarone due to risk of Qtc prolongation. Patient had last EKG on 01/20/23. QTcB at that time was WNL at 408 ms. Recommend continuing to monitor Qtc while patient is on both agents concomitantly.  Category C DDI between Xeloda and Pantoprazole - proton-pump inhibitors can decrease efficacy of Xeloda - will discuss with patient alternatives to pantoprazole, such as H2RA's like famotidine while on Xeloda. Category C DDI between Xeloda and Ondansetron due to risk of Qtc prolongation with fluorouracil products. Noted patient only taking PRN and PO route, risk higher with IV administration. No change in therapy warranted at this time.  Evaluated chart and no patient barriers to medication adherence noted.   Patient agreement for treatment documented in MD note on 02/08/23.  Prescription has been e-scribed to the [facility] for benefits analysis and approval.  Oral Oncology Clinic will continue to follow for insurance authorization, copayment issues, initial counseling and start date.  [name], PharmD, BCPS, [facility] Hematology/Oncology Clinical Pharmacist [facility] Oral Chemotherapy Navigation Clinics [phone] 02/09/2023 9:07 AM

## 2023-02-09 NOTE — Telephone Encounter (Signed)
Oral Chemotherapy Pharmacist Encounter  I spoke with patient's son, [name], for overview of: Xeloda (capecitabine) for the  treatment of rectal cancer in conjunction with pembrolizumab, planned duration until disease progression or unacceptable drug toxicity.  Counseled patient's son on administration, dosing, side effects, monitoring, drug-food interactions, safe handling, storage, and disposal.  Patient will take Xeloda 500mg  tablets, 3 tablets (1500mg ) by mouth in AM and 3 tabs (1500mg ) by mouth in PM, within 30 minutes of finishing meals, for 14 days on, 7 days off, repeated every 14 days.  Xeloda start date: 02/13/23  Adverse effects include but are not limited to: fatigue, decreased blood counts, GI upset, diarrhea, mouth sores, and hand-foot syndrome. Hand-foot syndrome: discussed use of cream such as [place]derly Smooth Extra Care 20 or equivalent advanced care cream that has 20% urea content for advanced skin hydration while on Xeloda Diarrhea: Patient will obtain Imodium (loperamide) to have on hand if they experience diarrhea. Patient knows to alert the office of 4 or more loose stools above baseline.  Reviewed importance of keeping a medication schedule and plan for any missed doses. No barriers to medication adherence identified.  Medication reconciliation performed and medication/allergy list updated. Patient's son knows patient will need to switch from pantoprazole to famotidine while on Xeloda due to risk of decreased efficacy when Xeloda is used in combination with PPIs.   All questions answered.  Patient's son voiced understanding and appreciation.   Medication education handout placed in mail for patient. Patient's son knows to call the office with questions or concerns. Oral Chemotherapy Clinic phone number provided.   [name], PharmD, BCPS, BCOP Hematology/Oncology Clinical Pharmacist [facility] Oral Chemotherapy Navigation  Clinics [phone] 02/09/2023 11:24 AM

## 2023-02-10 LAB — T4: T4, Total: 14.5 ug/dL — ABNORMAL HIGH (ref 4.5–12.0)

## 2023-02-12 ENCOUNTER — Other Ambulatory Visit (HOSPITAL_COMMUNITY): Payer: Self-pay

## 2023-02-12 ENCOUNTER — Other Ambulatory Visit: Payer: Self-pay

## 2023-02-14 ENCOUNTER — Encounter: Payer: Self-pay | Admitting: *Deleted

## 2023-02-14 ENCOUNTER — Encounter: Payer: Self-pay | Admitting: Hematology & Oncology

## 2023-02-20 DIAGNOSIS — E440 Moderate protein-calorie malnutrition: Secondary | ICD-10-CM | POA: Diagnosis not present

## 2023-02-20 DIAGNOSIS — I495 Sick sinus syndrome: Secondary | ICD-10-CM | POA: Diagnosis not present

## 2023-02-20 DIAGNOSIS — E119 Type 2 diabetes mellitus without complications: Secondary | ICD-10-CM | POA: Diagnosis not present

## 2023-02-20 DIAGNOSIS — D509 Iron deficiency anemia, unspecified: Secondary | ICD-10-CM | POA: Diagnosis not present

## 2023-02-20 DIAGNOSIS — I480 Paroxysmal atrial fibrillation: Secondary | ICD-10-CM | POA: Diagnosis not present

## 2023-02-20 DIAGNOSIS — C20 Malignant neoplasm of rectum: Secondary | ICD-10-CM | POA: Diagnosis not present

## 2023-02-20 DIAGNOSIS — K625 Hemorrhage of anus and rectum: Secondary | ICD-10-CM | POA: Diagnosis not present

## 2023-02-20 DIAGNOSIS — K429 Umbilical hernia without obstruction or gangrene: Secondary | ICD-10-CM | POA: Diagnosis not present

## 2023-02-20 DIAGNOSIS — K5909 Other constipation: Secondary | ICD-10-CM | POA: Diagnosis not present

## 2023-02-21 ENCOUNTER — Other Ambulatory Visit: Payer: Self-pay

## 2023-02-23 ENCOUNTER — Inpatient Hospital Stay: Payer: Medicare PPO

## 2023-02-23 ENCOUNTER — Encounter: Payer: Self-pay | Admitting: Hematology & Oncology

## 2023-02-23 ENCOUNTER — Other Ambulatory Visit: Payer: Self-pay

## 2023-02-23 ENCOUNTER — Encounter: Payer: Self-pay | Admitting: *Deleted

## 2023-02-23 ENCOUNTER — Inpatient Hospital Stay: Payer: Medicare PPO | Attending: Hematology & Oncology

## 2023-02-23 ENCOUNTER — Inpatient Hospital Stay (HOSPITAL_BASED_OUTPATIENT_CLINIC_OR_DEPARTMENT_OTHER): Payer: Medicare PPO | Admitting: Hematology & Oncology

## 2023-02-23 VITALS — BP 171/59 | HR 77 | Temp 98.1°F | Resp 19 | Ht 60.0 in | Wt 144.1 lb

## 2023-02-23 DIAGNOSIS — Z87891 Personal history of nicotine dependence: Secondary | ICD-10-CM | POA: Insufficient documentation

## 2023-02-23 DIAGNOSIS — Z79620 Long term (current) use of immunosuppressive biologic: Secondary | ICD-10-CM | POA: Insufficient documentation

## 2023-02-23 DIAGNOSIS — C20 Malignant neoplasm of rectum: Secondary | ICD-10-CM | POA: Insufficient documentation

## 2023-02-23 DIAGNOSIS — R911 Solitary pulmonary nodule: Secondary | ICD-10-CM | POA: Diagnosis not present

## 2023-02-23 DIAGNOSIS — Z5112 Encounter for antineoplastic immunotherapy: Secondary | ICD-10-CM | POA: Diagnosis not present

## 2023-02-23 LAB — CMP (CANCER CENTER ONLY)
ALT: 23 U/L (ref 0–44)
AST: 19 U/L (ref 15–41)
Albumin: 4.4 g/dL (ref 3.5–5.0)
Alkaline Phosphatase: 65 U/L (ref 38–126)
Anion gap: 8 (ref 5–15)
BUN: 19 mg/dL (ref 8–23)
CO2: 30 mmol/L (ref 22–32)
Calcium: 10 mg/dL (ref 8.9–10.3)
Chloride: 96 mmol/L — ABNORMAL LOW (ref 98–111)
Creatinine: 0.81 mg/dL (ref 0.44–1.00)
GFR, Estimated: >60 mL/min (ref >60)
Glucose, Bld: 123 mg/dL — ABNORMAL HIGH (ref 70–99)
Potassium: 4.6 mmol/L (ref 3.5–5.1)
Sodium: 134 mmol/L — ABNORMAL LOW (ref 135–145)
Total Bilirubin: 0.6 mg/dL (ref 0.3–1.2)
Total Protein: 7.1 g/dL (ref 6.5–8.1)

## 2023-02-23 LAB — CBC WITH DIFFERENTIAL (CANCER CENTER ONLY)
Abs Immature Granulocytes: 0.02 10*3/uL (ref 0.00–0.07)
Basophils Absolute: 0 10*3/uL (ref 0.0–0.1)
Basophils Relative: 1 %
Eosinophils Absolute: 0.1 10*3/uL (ref 0.0–0.5)
Eosinophils Relative: 1 %
HCT: 37.1 % (ref 36.0–46.0)
Hemoglobin: 12.2 g/dL (ref 12.0–15.0)
Immature Granulocytes: 0 %
Lymphocytes Relative: 40 %
Lymphs Abs: 2.6 10*3/uL (ref 0.7–4.0)
MCH: 29.1 pg (ref 26.0–34.0)
MCHC: 32.9 g/dL (ref 30.0–36.0)
MCV: 88.5 fL (ref 80.0–100.0)
Monocytes Absolute: 0.6 10*3/uL (ref 0.1–1.0)
Monocytes Relative: 9 %
Neutro Abs: 3.2 10*3/uL (ref 1.7–7.7)
Neutrophils Relative %: 49 %
Platelet Count: 231 10*3/uL (ref 150–400)
RBC: 4.19 MIL/uL (ref 3.87–5.11)
RDW: 18.8 % — ABNORMAL HIGH (ref 11.5–15.5)
WBC Count: 6.5 10*3/uL (ref 4.0–10.5)
nRBC: 0 % (ref 0.0–0.2)

## 2023-02-23 LAB — IRON AND IRON BINDING CAPACITY (CC-WL,HP ONLY)
Iron: 82 ug/dL (ref 28–170)
Saturation Ratios: 23 % (ref 10.4–31.8)
TIBC: 363 ug/dL (ref 250–450)
UIBC: 281 ug/dL (ref 148–442)

## 2023-02-23 LAB — RETICULOCYTES
Immature Retic Fract: 20.2 % — ABNORMAL HIGH (ref 2.3–15.9)
RBC.: 4.19 MIL/uL (ref 3.87–5.11)
Retic Count, Absolute: 119.8 10*3/uL (ref 19.0–186.0)
Retic Ct Pct: 2.9 % (ref 0.4–3.1)

## 2023-02-23 LAB — FERRITIN: Ferritin: 138 ng/mL (ref 11–307)

## 2023-02-23 LAB — PREALBUMIN: Prealbumin: 24 mg/dL (ref 18–38)

## 2023-02-23 LAB — CEA (IN HOUSE-CHCC): CEA (CHCC-In House): 2 ng/mL (ref 0.00–5.00)

## 2023-02-23 NOTE — Progress Notes (Signed)
[facility] Clinical Social Work  Initial Assessment   [name] is a 89 y.o. year old female contacted by phone. Clinical Social Work was referred by self for assessment of psychosocial needs.   SDOH (Social Determinants of Health) assessments performed: Yes   SDOH Screenings   Food Insecurity: No Food Insecurity (01/18/2023)  Housing: Low Risk  (01/18/2023)  Transportation Needs: No Transportation Needs (01/18/2023)  Utilities: Not At Risk (01/18/2023)  Tobacco Use: Medium Risk (02/23/2023)     Distress Screen completed: No     No data to display            Family/Social Information:  Housing Arrangement: Patient lives with her son, Rich Brave. Family members/support persons in your life? 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 Services Currently in place:  Medicare  Coping/ Adjustment to diagnosis: Patient understands treatment plan and what happens next? yes Concerns about diagnosis and/or treatment: Quality of life Patient reported stressors: Therapist, art and/or priorities: Family Patient enjoys time with family/ friends Current coping skills/ strengths: Manufacturing systems engineer , Radio producer fund of knowledge , and Supportive family/friends     SUMMARY: Current SDOH Barriers:  Financial constraints related to fixed income.  Clinical Social Work Clinical Goal(s):  Explore community resource options for unmet needs related to:  Financial Strain   Interventions: Discussed common feeling and emotions when being diagnosed with cancer, and the importance of support during treatment Informed patient of the support team roles and support services at [facility] Provided CSW contact information and encouraged  patient to call with any questions or concerns Provided patient with information about the [employer] and made a referral to [employer].   Follow Up Plan: Patient will contact CSW with any support or resource needs Patient verbalizes understanding of plan: Yes    Dorothey Baseman, LCSW Clinical Social Worker [facility]

## 2023-02-23 NOTE — Progress Notes (Signed)
Hematology and Oncology Follow Up Visit  [name] [id number] [birth year] 89 y.o. 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 3 weeks-s/p cycle #1  -- start on 02/09/2023 Xeloda 1500 mg po BID (14/7) -- start cycle #1 on 02/13/2023     Interim History:  [name] is back for follow-up.  I have seen her a little bit early just because I want to make sure that her hemoglobin was holding steady.  I think she is doing quite nicely.  Her hemoglobin is holding steady.  She said that there is some intermittent bleeding from the rectum.  She has little bit of pain in the rectum.  She started her Xeloda.  She is doing quite nicely with the Xeloda right now.  She has had no issues with the pembrolizumab.  There is no cough or shortness of breath.  She has she had a little bit of diarrhea with the MiraLAX.  She is only on MiraLAX daily now.  She has had no problems with fever.  There has been no mouth sores.  Her appetite hopefully is picking up a little bit.  Overall, I would say that her performance status is probably ECOG 1.       Medications:  Current Outpatient Medications:    acetaminophen (TYLENOL) 325 MG tablet, Take 2 tablets (650 mg total) by mouth every 6 (six) hours as needed for mild pain (or Fever >/= 101)., Disp: 30 tablet, Rfl: 0   ALPRAZolam (XANAX) 0.5 MG tablet, Take 0.5 mg by mouth 3 (three) times daily as needed for anxiety. For anxiety, Disp: , Rfl:    amLODipine (NORVASC) 2.5 MG tablet, Take 1 tablet (2.5 mg total) by mouth daily., Disp: 15 tablet, Rfl: 0   atorvastatin (LIPITOR) 20 MG tablet, Take 20 mg by mouth daily., Disp: , Rfl:    capecitabine (XELODA) 500 MG tablet, Take 3 tablets (1,500 mg total) by mouth 2 (two) times daily after a meal. Take within 30 minutes after meals. Take for 14 days on, 7 days off. Repeat every 21 days., Disp: 84 tablet,  Rfl: 5   cholecalciferol (VITAMIN D3) 25 MCG (1000 UNIT) tablet, Take 1,000 Units by mouth daily., Disp: , Rfl:    cyanocobalamin 1000 MCG tablet, Take 1 tablet (1,000 mcg total) by mouth daily., Disp: 30 tablet, Rfl: 0   feeding supplement (ENSURE ENLIVE / ENSURE PLUS) LIQD, Take 237 mLs by mouth 3 (three) times daily with meals., Disp: 237 mL, Rfl: 12   fluticasone (FLONASE) 50 MCG/ACT nasal spray, Place 2 sprays into both nostrils daily., Disp: 11.1 mL, Rfl: 0   guaiFENesin-dextromethorphan (ROBITUSSIN DM) 100-10 MG/5ML syrup, Take 10 mLs by mouth every 4 (four) hours as needed for cough., Disp: 118 mL, Rfl: 0   HYDROcodone-acetaminophen (NORCO/VICODIN) 5-325 MG tablet, Take 1-2 tablets by mouth every 4 (four) hours as needed for moderate pain., Disp: 30 tablet, Rfl: 0   levothyroxine (SYNTHROID) 125 MCG tablet, Take 125 mcg by mouth daily before breakfast., Disp: , Rfl:    lidocaine-prilocaine (EMLA) cream, Apply to affected area once, Disp: 30 g, Rfl: 3   loratadine (CLARITIN) 10 MG tablet, Take 1 tablet (10 mg total) by mouth daily., Disp: 30 tablet, Rfl: 0   methocarbamol (ROBAXIN) 500 MG tablet, Take 1 tablet (500 mg total) by mouth every 8 (eight) hours as needed for muscle spasms (abd pain)., Disp: 10 tablet, Rfl:  0   ondansetron (ZOFRAN) 4 MG tablet, Take 1 tablet (4 mg total) by mouth every 6 (six) hours as needed for nausea., Disp: 20 tablet, Rfl: 0   ondansetron (ZOFRAN) 8 MG tablet, Take 1 tablet (8 mg total) by mouth every 8 (eight) hours as needed for nausea or vomiting., Disp: 30 tablet, Rfl: 1   polyethylene glycol (MIRALAX / GLYCOLAX) 17 g packet, Take 17 g by mouth daily as needed for mild constipation., Disp: 14 each, Rfl: 0   potassium chloride SA (K-DUR,KLOR-CON) 20 MEQ tablet, Take 20 mEq by mouth daily., Disp: , Rfl:    prochlorperazine (COMPAZINE) 10 MG tablet, Take 1 tablet (10 mg total) by mouth every 6 (six) hours as needed for nausea or vomiting., Disp: 30 tablet, Rfl:  1   amiodarone (PACERONE) 200 MG tablet, Take 1 tablet (200 mg total) by mouth daily. DO NOT START THIS MEDICATION UNTIL PREVIOUS DOSE (400mg ) HAS BEEN COMPLETED (Patient not taking: Reported on 02/02/2023), Disp: 30 tablet, Rfl: 0   amiodarone (PACERONE) 400 MG tablet, Take 1 tablet (400 mg total) by mouth daily for 7 days., Disp: 7 tablet, Rfl: 0  Allergies:  Allergies  Allergen Reactions   Prednisone Other (See Comments)    Climbs the walls    Past Medical History, Surgical history, Social history, and Family History were reviewed and updated.  Review of Systems: Review of Systems  Constitutional:  Positive for fatigue.  HENT:  Negative.    Eyes: Negative.   Respiratory: Negative.    Cardiovascular:  Positive for palpitations.  Gastrointestinal:  Positive for blood in stool and rectal pain.  Endocrine: Negative.   Genitourinary: Negative.    Musculoskeletal: Negative.   Skin: Negative.   Neurological:  Positive for light-headedness.  Hematological: Negative.   Psychiatric/Behavioral: Negative.      Physical Exam:  height is 5' (1.524 m) and weight is 144 lb 1.9 oz (65.4 kg). Her oral temperature is 98.1 F (36.7 C). Her blood pressure is 171/59 (abnormal) and her pulse is 77. Her respiration is 19 and oxygen saturation is 95%.   Wt Readings from Last 3 Encounters:  02/23/23 144 lb 1.9 oz (65.4 kg)  02/08/23 145 lb (65.8 kg)  02/02/23 147 lb (66.7 kg)    Physical Exam Vitals reviewed.  HENT:     Head: Normocephalic and atraumatic.  Eyes:     Pupils: Pupils are equal, round, and reactive to light.  Cardiovascular:     Rate and Rhythm: Normal rate and regular rhythm.     Heart sounds: Normal heart sounds.  Pulmonary:     Effort: Pulmonary effort is normal.     Breath sounds: Normal breath sounds.  Abdominal:     General: Bowel sounds are normal.     Palpations: Abdomen is soft.  Musculoskeletal:        General: No tenderness or deformity. Normal range of motion.      Cervical back: Normal range of motion.  Lymphadenopathy:     Cervical: No cervical adenopathy.  Skin:    General: Skin is warm and dry.     Findings: No erythema or rash.  Neurological:     Mental Status: She is alert and oriented to person, place, and time.  Psychiatric:        Behavior: Behavior normal.        Thought Content: Thought content normal.        Judgment: Judgment normal.     Lab Results  Component  Value Date   WBC 6.5 02/23/2023   HGB 12.2 02/23/2023   HCT 37.1 02/23/2023   MCV 88.5 02/23/2023   PLT 231 02/23/2023     Chemistry      Component Value Date/Time   NA 134 (L) 02/08/2023 0923   K 4.0 02/08/2023 0923   CL 95 (L) 02/08/2023 0923   CO2 31 02/08/2023 0923   BUN 15 02/08/2023 0923   CREATININE 0.85 02/08/2023 0923      Component Value Date/Time   CALCIUM 10.8 (H) 02/08/2023 0923   ALKPHOS 66 02/08/2023 0923   AST 18 02/08/2023 0923   ALT 19 02/08/2023 0923   BILITOT 0.6 02/08/2023 0923      Impression and Plan: [name] is a very charming 89 year old white female.  She has at least a locally advanced adenocarcinoma of the rectum.  This pulmonary nodule is in the right upper lobe.  It measures 15 mm.  Again, I would not imagine that she would be symptomatic from this.  Everything looks good with her blood counts.  I am happy that she is doing well with the Xeloda.  I am going to give her an extra week off so that I can actually see her.  I will go ahead and plan to get her back to see me on the 28th.  This will be her second cycle of pembrolizumab.  She was never had an elevated CEA so that is not a marker that we can follow.  Again, this is all about quality of life.  So far, her quality of life is doing quite well.    Josph Macho, MD 6/14/202411:02 AM

## 2023-02-23 NOTE — Progress Notes (Unsigned)
Patient came in for mid-cycle toxicity check. She is doing well. Her rectal bleeding is improving. She will get an extra week off before her next cycle.   Oncology Nurse Navigator Documentation     02/23/2023   10:45 AM  Oncology Nurse Navigator Flowsheets  Navigator Follow Up Date: 03/09/2023  Navigator Follow Up Reason: Follow-up Appointment;Chemotherapy  Navigator Location [facility]  Navigator Encounter Type Appt/Treatment Plan Review  Patient Visit Type MedOnc  Treatment Phase Active Tx  Barriers/Navigation Needs Coordination of Care;Education  Interventions None Required  Acuity Level 2-Minimal Needs (1-2 Barriers Identified)  Support Groups/Services Friends and Family  Time Spent with Patient 15

## 2023-02-25 NOTE — Progress Notes (Unsigned)
Cardiology Office Note:    Date:  02/26/2023  ID:  [name], DOB 03-22-1929, MRN [id number] PCP: Creola Corn, MD  [facility] Providers Cardiologist:  None       Patient Profile:      Paroxsymal atrial fibrillation  Tachy-Brady syndrome Post conversion pauses (longest 10 sec) Amiodarone Rx Not on anticoagulation - pending management of CA Coronary artery Ca2+ Aortic stenosis  TTE 01/21/23: EF 60-65, no RWMA, NL RVSF, NL PASP, RVSP 31.5, mild MR, mild AS, mean 10, Vmax 222 cm/s, DI 0.45, RAP 3  Diabetes mellitus  Hypertension  Hyperlipidemia  Hypothyroidism   Hx of GI bleed (AVM) Rectal CA Hx of CVA       History of Present Illness:   [name] is a 89 y.o. female who returns for post hospitalization follow up. She was admitted 5/8-5/19 w abd pain and rectal bleeding. She was ultimately dx with metastatic rectal CA. She was seen by Cardiology for atrial fibrillation with post conversion pauses up to 10 sec. She was not placed on anticoagulation. Notes indicate initiation of anticoagulation is pending decision regarding surgery for CA. Pt has followed w oncology since DC. She is now on Pembrolizumab and Xeloda. Review of oncology notes indicates she is still having some rectal bleeding.   She is here with her son. Since DC, she has done well w/o chest pain, shortness of breath, syncope, orthopnea, leg edema. She has been doing regular activities at home w/o limitation.   Review of Systems  Gastrointestinal:  Positive for hematochezia (She still notes mild occasional rectal bleeding).   See HPI    Studies Reviewed:    EKG:  NSR, HR 68, normal axis, QTc 448 ms   Risk Assessment/Calculations:    CHA2DS2-VASc Score = 8   This indicates a 10.8% annual risk of stroke. The patient's score is based upon: CHF History: 0 HTN History: 1 Diabetes History: 1 Stroke History: 2 Vascular Disease History: 1 Age Score: 2 Gender Score: 1    HYPERTENSION  CONTROL Vitals:   02/26/23 1005 02/26/23 1052  BP: (!) 150/40 (!) 160/70    The patient's blood pressure is elevated above target today.  In order to address the patient's elevated BP: A current anti-hypertensive medication was adjusted today.          Physical Exam:   VS:  BP (!) 160/70   Pulse 76   Ht 5' (1.524 m)   Wt 144 lb 3.2 oz (65.4 kg)   SpO2 96%   BMI 28.16 kg/m    Wt Readings from Last 3 Encounters:  02/26/23 144 lb 3.2 oz (65.4 kg)  02/23/23 144 lb 1.9 oz (65.4 kg)  02/08/23 145 lb (65.8 kg)    Constitutional:      Appearance: Healthy appearance. Not in distress.  Neck:     Vascular: JVD normal.  Pulmonary:     Breath sounds: Normal breath sounds. No wheezing. No rales.  Cardiovascular:     Normal rate. Regular rhythm.     Murmurs: There is a grade 2/6 crescendo-decrescendo systolic murmur at the URSB.  Edema:    Peripheral edema absent.  Abdominal:     Palpations: Abdomen is soft.       ASSESSMENT AND PLAN:   Paroxysmal atrial fibrillation (HCC) She was recently admitted with rectal bleeding and ultimately dx with rectal CA. She had post termination pauses up to 10 sec in the hospital and was started on Amiodarone to reduce  episodes of AFib. She remains in NSR today. She has not had syncope. She will has some bleeding from time to time. Question if we can start Eliquis now or if we should hold off. I will reach out to Dr. Elease Hashimoto who did her consult and Dr. Royann Shivers who rounded on her primarily in the hospital. I will also reach out to Dr. Myna Hidalgo, her oncologist. Continue Amiodarone 200 mg once daily. Will plan on follow up CMET, TSH, T4 at next visit. We discussed the importance of annual eye exams. Follow up in 3 mos.   HTN (hypertension) BP is still above goal. Increase Amlodipine to 7.5 mg once daily. If BP continues to be difficult to control, consider adding ARB.  Aortic stenosis Mild by recent echocardiogram. Consider repeat echocardiogram in 2-3  years.  Rectal cancer (HCC) Continue follow up with oncology.   Coronary artery calcification seen on CT scan She is not having chest pain to suggest angina. Continue Atorvastatin 20 mg once daily.      Dispo:  Return in about 3 months (around 05/29/2023) for Routine Follow Up, w/ Dr. Elease Hashimoto, or Tereso Newcomer, PA-C.  Signed, Tereso Newcomer, PA-C

## 2023-02-26 ENCOUNTER — Telehealth: Payer: Self-pay | Admitting: Physician Assistant

## 2023-02-26 ENCOUNTER — Encounter: Payer: Self-pay | Admitting: Physician Assistant

## 2023-02-26 ENCOUNTER — Ambulatory Visit: Payer: Medicare PPO | Attending: Physician Assistant | Admitting: Physician Assistant

## 2023-02-26 VITALS — BP 160/70 | HR 76 | Ht 60.0 in | Wt 144.2 lb

## 2023-02-26 DIAGNOSIS — I2510 Atherosclerotic heart disease of native coronary artery without angina pectoris: Secondary | ICD-10-CM | POA: Insufficient documentation

## 2023-02-26 DIAGNOSIS — I10 Essential (primary) hypertension: Secondary | ICD-10-CM | POA: Diagnosis not present

## 2023-02-26 DIAGNOSIS — I350 Nonrheumatic aortic (valve) stenosis: Secondary | ICD-10-CM | POA: Diagnosis not present

## 2023-02-26 DIAGNOSIS — C20 Malignant neoplasm of rectum: Secondary | ICD-10-CM

## 2023-02-26 DIAGNOSIS — I480 Paroxysmal atrial fibrillation: Secondary | ICD-10-CM | POA: Diagnosis not present

## 2023-02-26 DIAGNOSIS — I495 Sick sinus syndrome: Secondary | ICD-10-CM

## 2023-02-26 HISTORY — DX: Nonrheumatic aortic (valve) stenosis: I35.0

## 2023-02-26 MED ORDER — AMLODIPINE BESYLATE 5 MG PO TABS: 7.5000 mg | ORAL_TABLET | Freq: Every day | ORAL | 3 refills | Status: DC

## 2023-02-26 NOTE — Assessment & Plan Note (Signed)
Mild by recent echocardiogram. Consider repeat echocardiogram in 2-3 years.

## 2023-02-26 NOTE — Telephone Encounter (Signed)
-----   Message from Josph Macho, MD sent at 02/26/2023  1:45 PM EDT ----- Start the eliquis ----- Message ----- From: Beatrice Lecher, PA-C Sent: 02/26/2023  11:05 AM EDT To: Thurmon Fair, MD; Vesta Mixer, MD; #  Dr. Elease Hashimoto, Dr. Royann Shivers, Dr. Myna Hidalgo -  Hi! I saw her today and she is doing great. The last note from our service in the hospital (from Dr. Royann Shivers) noted that we should start anticoagulation once there are no plans for surgery/procedures for her CA. I do not see any plans for procedures. It looks like the bronchoscopy was canceled. She still has some rectal bleeding from time to time. Her Hgb has been ok. I wanted to get your opinion on whether or not we should start Eliquis now? Thanks! [name]

## 2023-02-26 NOTE — Assessment & Plan Note (Signed)
BP is still above goal. Increase Amlodipine to 7.5 mg once daily. If BP continues to be difficult to control, consider adding ARB.

## 2023-02-26 NOTE — Assessment & Plan Note (Signed)
Continue follow-up with oncology 

## 2023-02-26 NOTE — Patient Instructions (Signed)
Medication Instructions:  Your physician has recommended you make the following change in your medication:   INCREASE Amlodipine to 5 taking 1 1/2 tablet daily   *If you need a refill on your cardiac medications before your next appointment, please call your pharmacy*   Lab Work: 3 MONTHS AT APPOINTMENT:  CMET, TSH, & FT4   If you have labs (blood work) drawn today and your tests are completely normal, you will receive your results only by: MyChart Message (if you have MyChart) OR A paper copy in the mail If you have any lab test that is abnormal or we need to change your treatment, we will call you to review the results.   Testing/Procedures: None ordered   Follow-Up: At [facility], you and your health needs are our priority.  As part of our continuing mission to provide you with exceptional heart care, we have created designated Provider Care Teams.  These Care Teams include your primary Cardiologist (physician) and Advanced Practice Providers (APPs -  Physician Assistants and Nurse Practitioners) who all work together to provide you with the care you need, when you need it.  We recommend signing up for the patient portal called "MyChart".  Sign up information is provided on this After Visit Summary.  MyChart is used to connect with patients for Virtual Visits (Telemedicine).  Patients are able to view lab/test results, encounter notes, upcoming appointments, etc.  Non-urgent messages can be sent to your provider as well.   To learn more about what you can do with MyChart, go to [email or web address].    Your next appointment:   3 month(s)  Provider:   Delane Ginger, MD    Other Instructions

## 2023-02-26 NOTE — Telephone Encounter (Signed)
Please tell pt that I heard back from her Oncologist, Dr. Myna Hidalgo. He felt that she can start anticoagulation with Eliquis now. PLAN:  -Start Eliquis 5 mg twice daily  -Please arrange follow up in 4-6 weeks instead of 3 mos with me or Dr. Conan Bowens, PA-C    02/26/2023 9:22 PM

## 2023-02-26 NOTE — Assessment & Plan Note (Signed)
She is not having chest pain to suggest angina. Continue Atorvastatin 20 mg once daily.

## 2023-02-26 NOTE — Assessment & Plan Note (Addendum)
She was recently admitted with rectal bleeding and ultimately dx with rectal CA. She had post termination pauses up to 10 sec in the hospital and was started on Amiodarone to reduce episodes of AFib. She remains in NSR today. She has not had syncope. She will has some bleeding from time to time. Question if we can start Eliquis now or if we should hold off. I will reach out to Dr. Elease Hashimoto who did her consult and Dr. Royann Shivers who rounded on her primarily in the hospital. I will also reach out to Dr. Myna Hidalgo, her oncologist. Continue Amiodarone 200 mg once daily. Will plan on follow up CMET, TSH, T4 at next visit. We discussed the importance of annual eye exams. Follow up in 3 mos.

## 2023-02-27 ENCOUNTER — Telehealth: Payer: Self-pay | Admitting: Hematology and Oncology

## 2023-02-27 ENCOUNTER — Encounter: Payer: Self-pay | Admitting: Hematology & Oncology

## 2023-02-27 ENCOUNTER — Other Ambulatory Visit: Payer: Self-pay

## 2023-02-27 ENCOUNTER — Other Ambulatory Visit (HOSPITAL_COMMUNITY): Payer: Self-pay

## 2023-02-27 MED ORDER — APIXABAN 5 MG PO TABS: 5.0000 mg | ORAL_TABLET | Freq: Two times a day (BID) | ORAL | 0 refills | Status: DC

## 2023-02-27 MED ORDER — APIXABAN 5 MG PO TABS: 5.0000 mg | ORAL_TABLET | Freq: Two times a day (BID) | ORAL | 1 refills | Status: DC

## 2023-02-27 NOTE — Telephone Encounter (Signed)
Scheduled appointment per referral message. Left voicemail for the patients son [name] [phone].

## 2023-02-27 NOTE — Telephone Encounter (Signed)
Call placed to pt and her son.  They both have been made aware to start Eliquis 5 mg bid.  I have sent in 30 day supply to [facility] and a 90 day supply to [facility].  Have moved pt's appointment up to 04/03/23 with Dr. Elease Hashimoto.

## 2023-02-28 ENCOUNTER — Encounter: Payer: Self-pay | Admitting: Hematology & Oncology

## 2023-02-28 NOTE — Progress Notes (Signed)
Spoke with [name]'s son [name] about the [employer] for patient. They will be in next week to sign and bring in proof of income.

## 2023-03-01 DIAGNOSIS — C7800 Secondary malignant neoplasm of unspecified lung: Secondary | ICD-10-CM | POA: Diagnosis not present

## 2023-03-01 DIAGNOSIS — E119 Type 2 diabetes mellitus without complications: Secondary | ICD-10-CM | POA: Diagnosis not present

## 2023-03-01 DIAGNOSIS — I495 Sick sinus syndrome: Secondary | ICD-10-CM | POA: Diagnosis not present

## 2023-03-01 DIAGNOSIS — I480 Paroxysmal atrial fibrillation: Secondary | ICD-10-CM | POA: Diagnosis not present

## 2023-03-01 DIAGNOSIS — C20 Malignant neoplasm of rectum: Secondary | ICD-10-CM | POA: Diagnosis not present

## 2023-03-01 DIAGNOSIS — I10 Essential (primary) hypertension: Secondary | ICD-10-CM | POA: Diagnosis not present

## 2023-03-01 DIAGNOSIS — R634 Abnormal weight loss: Secondary | ICD-10-CM | POA: Diagnosis not present

## 2023-03-01 DIAGNOSIS — I350 Nonrheumatic aortic (valve) stenosis: Secondary | ICD-10-CM | POA: Diagnosis not present

## 2023-03-01 DIAGNOSIS — Z79899 Other long term (current) drug therapy: Secondary | ICD-10-CM | POA: Diagnosis not present

## 2023-03-02 NOTE — Progress Notes (Signed)
Palliative Medicine [facility]  [phone] [phone]   Name: [name] Date: 03/02/2023 MRN: [id number]  DOB: [birth year]  Patient Care Team: Creola Corn, MD as PCP - General (Internal Medicine) Myna Hidalgo Rose Phi, MD as Medical Oncologist (Oncology) Gwendel Hanson, RN as Oncology Nurse Navigator   I connected with [name] on 03/07/23 at  1:15 PM EDT by Video chat and verified that I am speaking with the correct person using two identifiers.   I discussed the limitations, risks, security and privacy concerns of performing an evaluation and management service by telemedicine and the availability of in-person appointments. I also discussed with the patient that there may be a patient responsible charge related to this service. The patient expressed understanding and agreed to proceed.   Other persons participating in the visit and their role in the encounter: Son, [name]   Patient's location: Home  Provider's location: [facility]     REASON FOR CONSULTATION: [name] is a 89 y.o. female with oncologic medical history including recently diagnosed rectal cancer (01/2023) as well as a previous stroke, anxiety, hyperlipidemia, hypothyroidism, diabetes, HTN, and anemia.  Palliative ask to see for symptom management and goals of care.    SOCIAL HISTORY:     reports that she has quit smoking. Her smoking use included cigarettes. She smoked an average of .5 packs per day. She has never used smokeless tobacco. She reports that she does not drink alcohol and does not use drugs.  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   Mucous retention cyst of maxillary sinus    Osteoporosis    Pinched nerve In neck   Plantar fasciitis    Stroke (HCC)    Vitamin D deficiency     PAST SURGICAL HISTORY:  Past Surgical History:  Procedure Laterality Date   APPENDECTOMY     BIOPSY  01/19/2023   Procedure: BIOPSY;  Surgeon: Beverley Fiedler, MD;  Location: [facility];  Service: Gastroenterology;;   CHOLECYSTECTOMY     2004   COLONOSCOPY N/A 02/24/2013   Procedure: COLONOSCOPY;  Surgeon: Meryl Dare, MD;  Location: [facility] ENDOSCOPY;  Service: Endoscopy;  Laterality: N/A;   COLONOSCOPY W/ POLYPECTOMY     COLONOSCOPY WITH PROPOFOL N/A 12/03/2017   Procedure: COLONOSCOPY WITH PROPOFOL;  Surgeon: Rachael Fee, MD;  Location: [facility] ENDOSCOPY;  Service: Gastroenterology;  Laterality: N/A;   ESOPHAGOGASTRODUODENOSCOPY     ESOPHAGOGASTRODUODENOSCOPY N/A 02/24/2013   Procedure: ESOPHAGOGASTRODUODENOSCOPY (EGD);  Surgeon: Meryl Dare, MD;  Location: [facility];  Service: Endoscopy;  Laterality: N/A;   ESOPHAGOGASTRODUODENOSCOPY (EGD) WITH PROPOFOL N/A 12/03/2017   Procedure: ESOPHAGOGASTRODUODENOSCOPY (EGD) WITH PROPOFOL;  Surgeon: Rachael Fee, MD;  Location: [facility] ENDOSCOPY;  Service: Gastroenterology;  Laterality: N/A;   EYE SURGERY     bilateral catracts   FLEXIBLE SIGMOIDOSCOPY N/A 01/19/2023  Procedure: FLEXIBLE SIGMOIDOSCOPY;  Surgeon: Beverley Fiedler, MD;  Location: [facility];  Service: Gastroenterology;  Laterality: N/A;    HEMATOLOGY/ONCOLOGY HISTORY:  Oncology History  Rectal cancer (HCC)  01/19/2023 Initial Diagnosis   Rectal cancer (HCC)   02/02/2023 Cancer Staging   Staging form: Colon and Rectum, AJCC 8th Edition - Clinical: cT3, cN1a, cM1 - Signed by Josph Macho, MD on 02/02/2023 Stage prefix: Initial diagnosis Total positive  nodes: 1 Histologic grade (G): G2 Histologic grading system: 4 grade system   02/08/2023 -  Chemotherapy   Patient is on Treatment Plan : COLORECTAL Pembrolizumab (200) q21d       ALLERGIES:  is allergic to prednisone.  MEDICATIONS:  Current Outpatient Medications  Medication Sig Dispense Refill   acetaminophen (TYLENOL) 325 MG tablet Take 2 tablets (650 mg total) by mouth every 6 (six) hours as needed for mild pain (or Fever >/= 101). 30 tablet 0   ALPRAZolam (XANAX) 0.5 MG tablet Take 0.5 mg by mouth 3 (three) times daily as needed for anxiety. For anxiety     amiodarone (PACERONE) 200 MG tablet Take 1 tablet (200 mg total) by mouth daily. DO NOT START THIS MEDICATION UNTIL PREVIOUS DOSE (400mg ) HAS BEEN COMPLETED 30 tablet 0   amLODipine (NORVASC) 5 MG tablet Take 1.5 tablets (7.5 mg total) by mouth daily. 135 tablet 3   apixaban (ELIQUIS) 5 MG TABS tablet Take 1 tablet (5 mg total) by mouth 2 (two) times daily. 180 tablet 1   atorvastatin (LIPITOR) 20 MG tablet Take 20 mg by mouth daily.     capecitabine (XELODA) 500 MG tablet Take 3 tablets (1,500 mg total) by mouth 2 (two) times daily after a meal. Take within 30 minutes after meals. Take for 14 days on, 7 days off. Repeat every 21 days. 84 tablet 5   cholecalciferol (VITAMIN D3) 25 MCG (1000 UNIT) tablet Take 1,000 Units by mouth daily.     cyanocobalamin 1000 MCG tablet Take 1 tablet (1,000 mcg total) by mouth daily. 30 tablet 0   Escitalopram Oxalate (LEXAPRO PO) Take 1 tablet by mouth daily.     feeding supplement (ENSURE ENLIVE / ENSURE PLUS) LIQD Take 237 mLs by mouth 3 (three) times daily with meals. 237 mL 12   fluticasone (FLONASE) 50 MCG/ACT nasal spray Place 2 sprays into both nostrils daily. 11.1 mL 0   guaiFENesin-dextromethorphan (ROBITUSSIN DM) 100-10 MG/5ML syrup Take 10 mLs by mouth every 4 (four) hours as needed for cough. 118 mL 0   HYDROcodone-acetaminophen (NORCO/VICODIN) 5-325 MG tablet Take 1-2 tablets by mouth  every 4 (four) hours as needed for moderate pain. 30 tablet 0   levothyroxine (SYNTHROID) 125 MCG tablet Take 125 mcg by mouth daily before breakfast.     lidocaine-prilocaine (EMLA) cream Apply to affected area once 30 g 3   loratadine (CLARITIN) 10 MG tablet Take 1 tablet (10 mg total) by mouth daily. 30 tablet 0   methocarbamol (ROBAXIN) 500 MG tablet Take 1 tablet (500 mg total) by mouth every 8 (eight) hours as needed for muscle spasms (abd pain). 10 tablet 0   polyethylene glycol (MIRALAX / GLYCOLAX) 17 g packet Take 17 g by mouth daily as needed for mild constipation. 14 each 0   potassium chloride SA (K-DUR,KLOR-CON) 20 MEQ tablet Take 20 mEq by mouth daily.     prochlorperazine (COMPAZINE) 10 MG tablet Take 1 tablet (10 mg total) by mouth every 6 (six) hours as needed for nausea or vomiting.  30 tablet 1   No current facility-administered medications for this visit.    VITAL SIGNS: There were no vitals taken for this visit. There were no vitals filed for this visit.  Estimated body mass index is 28.16 kg/m as calculated from the following:   Height as of 02/26/23: 5' (1.524 m).   Weight as of 02/26/23: 144 lb 3.2 oz (65.4 kg).  LABS: CBC:    Component Value Date/Time   WBC 6.5 02/23/2023 1030   WBC 4.9 01/28/2023 0502   HGB 12.2 02/23/2023 1030   HCT 37.1 02/23/2023 1030   PLT 231 02/23/2023 1030   MCV 88.5 02/23/2023 1030   NEUTROABS 3.2 02/23/2023 1030   LYMPHSABS 2.6 02/23/2023 1030   MONOABS 0.6 02/23/2023 1030   EOSABS 0.1 02/23/2023 1030   BASOSABS 0.0 02/23/2023 1030   Comprehensive Metabolic Panel:    Component Value Date/Time   NA 134 (L) 02/23/2023 1030   K 4.6 02/23/2023 1030   CL 96 (L) 02/23/2023 1030   CO2 30 02/23/2023 1030   BUN 19 02/23/2023 1030   CREATININE 0.81 02/23/2023 1030   GLUCOSE 123 (H) 02/23/2023 1030   CALCIUM 10.0 02/23/2023 1030   AST 19 02/23/2023 1030   ALT 23 02/23/2023 1030   ALKPHOS 65 02/23/2023 1030   BILITOT 0.6  02/23/2023 1030   PROT 7.1 02/23/2023 1030   ALBUMIN 4.4 02/23/2023 1030    RADIOGRAPHIC STUDIES: No results found.  PERFORMANCE STATUS (ECOG) : 1 - Symptomatic but completely ambulatory  Review of Systems  Constitutional:  Positive for activity change and fatigue.  Gastrointestinal:  Positive for diarrhea.  Unless otherwise noted, a complete review of systems is negative.   IMPRESSION: This is my initial visit with [name]. I connected via My Chart video with patient and her son, [name]. No acute distress noted. Patient is alert and able to engage in discussions. She was initially seen during her recent hospitalization by our team.   I introduced myself, [place] RN, and Palliative's role in collaboration with the oncology team. Concept of Palliative Care was introduced as specialized medical care for people and their families living with serious illness.  It focuses on providing relief from the symptoms and stress of a serious illness.  The goal is to improve quality of life for both the patient and the family. Values and goals of care important to patient and family were attempted to be elicited.   [name] lives in the home with her son, [name]. She also has a daughter who unfortunately resides in SNF due to significant injury years prior. 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 Widowed.   Patient reports she is able to perform most ADLs independently in the home with some needed assistance. Does not require any home equipment at this time. Appetite is good. She typically eats 2-4 meals daily with snacks in between. She tries to drink Boost daily. Prefers chocolate flavor. Will place some sample at the office for pick-up on Friday.  She is having some loose stools requiring her to wear briefs. Denies any recent falls. Is sleeping well at night. Able to assist with some housework.   [name] denies pain or discomfort. Denies nausea, vomiting. No symptom  management needs at this time. She speaks to concerns regarding financial concern over recent hospital bill. Advised patient and son to contact billing and discuss bill inquiring about financial resources and support for large balance.    PLAN: Established  therapeutic relationship. Education provided on palliative's role in collaboration with their Oncology/Radiation team. No symptom management needs at this time.  Ongoing support as needed. Patient and family knows to contact office as needed.  Chocolate Boost/Ensure to pick-up at next appointment.  I will plan to see patient back in 6-8 weeks in collaboration to other oncology appointments virtually.   Patient expressed understanding and was in agreement with this plan. She also understands that She can call the clinic at any time with any questions, concerns, or complaints.   Thank you for your referral and allowing Palliative to assist in [name]'s care.   Number and complexity of problems addressed: HIGH - 1 or more chronic illnesses with SEVERE exacerbation, progression, or side effects of treatment - advanced cancer, pain. Any controlled substances utilized were prescribed in the context of palliative care.   Visit consisted of counseling and education dealing with the complex and emotionally intense issues of symptom management and palliative care in the setting of serious and potentially life-threatening illness.Greater than 50%  of this time was spent counseling and coordinating care related to the above assessment and plan.  Signed by: Willette Alma, AGPCNP-BC Palliative Medicine Team/[facility]   *Please note that this is a verbal dictation therefore any spelling or grammatical errors are due to the "Dragon [employer] One" system interpretation.

## 2023-03-05 ENCOUNTER — Encounter: Payer: Self-pay | Admitting: Hematology & Oncology

## 2023-03-05 NOTE — Telephone Encounter (Signed)
Called and spoke w/son. Pt is unable to sit for long periods of time and may not be able to come to the appt. Discussed with Mr Mode (son) to call and discuss with Palliative Care his concerns as the appt may be able to be done over the phone.  Provided Palliative care phone number to Mr. Remmert (son).  He verbalized understanding and will call Palliative care. No further concerns at this time.

## 2023-03-07 ENCOUNTER — Inpatient Hospital Stay: Payer: Medicare PPO | Admitting: Nurse Practitioner

## 2023-03-07 DIAGNOSIS — Z7189 Other specified counseling: Secondary | ICD-10-CM

## 2023-03-07 DIAGNOSIS — Z515 Encounter for palliative care: Secondary | ICD-10-CM | POA: Diagnosis not present

## 2023-03-07 DIAGNOSIS — R197 Diarrhea, unspecified: Secondary | ICD-10-CM

## 2023-03-07 DIAGNOSIS — C20 Malignant neoplasm of rectum: Secondary | ICD-10-CM | POA: Diagnosis not present

## 2023-03-07 DIAGNOSIS — R630 Anorexia: Secondary | ICD-10-CM

## 2023-03-07 DIAGNOSIS — R530 Neoplastic (malignant) related fatigue: Secondary | ICD-10-CM

## 2023-03-08 ENCOUNTER — Encounter: Payer: Self-pay | Admitting: Nurse Practitioner

## 2023-03-08 ENCOUNTER — Encounter: Payer: Self-pay | Admitting: Hematology & Oncology

## 2023-03-09 ENCOUNTER — Inpatient Hospital Stay: Payer: Medicare PPO

## 2023-03-09 ENCOUNTER — Encounter: Payer: Self-pay | Admitting: Family

## 2023-03-09 ENCOUNTER — Inpatient Hospital Stay: Payer: Medicare PPO | Admitting: Family

## 2023-03-09 ENCOUNTER — Other Ambulatory Visit: Payer: Self-pay

## 2023-03-09 ENCOUNTER — Encounter: Payer: Self-pay | Admitting: *Deleted

## 2023-03-09 VITALS — BP 161/50 | HR 66 | Resp 18

## 2023-03-09 VITALS — BP 167/60 | HR 73 | Temp 97.7°F | Resp 18 | Wt 148.1 lb

## 2023-03-09 DIAGNOSIS — E032 Hypothyroidism due to medicaments and other exogenous substances: Secondary | ICD-10-CM

## 2023-03-09 DIAGNOSIS — C20 Malignant neoplasm of rectum: Secondary | ICD-10-CM

## 2023-03-09 DIAGNOSIS — Z515 Encounter for palliative care: Secondary | ICD-10-CM

## 2023-03-09 DIAGNOSIS — Z5112 Encounter for antineoplastic immunotherapy: Secondary | ICD-10-CM | POA: Diagnosis not present

## 2023-03-09 DIAGNOSIS — D509 Iron deficiency anemia, unspecified: Secondary | ICD-10-CM | POA: Diagnosis not present

## 2023-03-09 DIAGNOSIS — Z79620 Long term (current) use of immunosuppressive biologic: Secondary | ICD-10-CM | POA: Diagnosis not present

## 2023-03-09 DIAGNOSIS — R911 Solitary pulmonary nodule: Secondary | ICD-10-CM | POA: Diagnosis not present

## 2023-03-09 DIAGNOSIS — Z7189 Other specified counseling: Secondary | ICD-10-CM

## 2023-03-09 DIAGNOSIS — Z87891 Personal history of nicotine dependence: Secondary | ICD-10-CM | POA: Diagnosis not present

## 2023-03-09 LAB — IRON AND IRON BINDING CAPACITY (CC-WL,HP ONLY)
Iron: 69 ug/dL (ref 28–170)
Saturation Ratios: 18 % (ref 10.4–31.8)
TIBC: 391 ug/dL (ref 250–450)
UIBC: 322 ug/dL (ref 148–442)

## 2023-03-09 LAB — CEA (IN HOUSE-CHCC): CEA (CHCC-In House): 1.27 ng/mL (ref 0.00–5.00)

## 2023-03-09 LAB — CMP (CANCER CENTER ONLY)
ALT: 34 U/L (ref 0–44)
AST: 29 U/L (ref 15–41)
Albumin: 4.3 g/dL (ref 3.5–5.0)
Alkaline Phosphatase: 64 U/L (ref 38–126)
Anion gap: 8 (ref 5–15)
BUN: 15 mg/dL (ref 8–23)
CO2: 30 mmol/L (ref 22–32)
Calcium: 9.6 mg/dL (ref 8.9–10.3)
Chloride: 96 mmol/L — ABNORMAL LOW (ref 98–111)
Creatinine: 0.76 mg/dL (ref 0.44–1.00)
GFR, Estimated: >60 mL/min (ref >60)
Glucose, Bld: 113 mg/dL — ABNORMAL HIGH (ref 70–99)
Potassium: 4.5 mmol/L (ref 3.5–5.1)
Sodium: 134 mmol/L — ABNORMAL LOW (ref 135–145)
Total Bilirubin: 0.8 mg/dL (ref 0.3–1.2)
Total Protein: 7.1 g/dL (ref 6.5–8.1)

## 2023-03-09 LAB — CBC WITH DIFFERENTIAL (CANCER CENTER ONLY)
Abs Immature Granulocytes: 0.01 10*3/uL (ref 0.00–0.07)
Basophils Absolute: 0.1 10*3/uL (ref 0.0–0.1)
Basophils Relative: 1 %
Eosinophils Absolute: 0.1 10*3/uL (ref 0.0–0.5)
Eosinophils Relative: 1 %
HCT: 34.1 % — ABNORMAL LOW (ref 36.0–46.0)
Hemoglobin: 11 g/dL — ABNORMAL LOW (ref 12.0–15.0)
Immature Granulocytes: 0 %
Lymphocytes Relative: 37 %
Lymphs Abs: 2.3 10*3/uL (ref 0.7–4.0)
MCH: 30.6 pg (ref 26.0–34.0)
MCHC: 32.3 g/dL (ref 30.0–36.0)
MCV: 95 fL (ref 80.0–100.0)
Monocytes Absolute: 0.7 10*3/uL (ref 0.1–1.0)
Monocytes Relative: 11 %
Neutro Abs: 3.1 10*3/uL (ref 1.7–7.7)
Neutrophils Relative %: 50 %
Platelet Count: 210 10*3/uL (ref 150–400)
RBC: 3.59 MIL/uL — ABNORMAL LOW (ref 3.87–5.11)
RDW: 22 % — ABNORMAL HIGH (ref 11.5–15.5)
WBC Count: 6.3 10*3/uL (ref 4.0–10.5)
nRBC: 0 % (ref 0.0–0.2)

## 2023-03-09 LAB — FERRITIN: Ferritin: 77 ng/mL (ref 11–307)

## 2023-03-09 LAB — TSH: TSH: 5.22 u[IU]/mL — ABNORMAL HIGH (ref 0.350–4.500)

## 2023-03-09 MED ORDER — SODIUM CHLORIDE 0.9 % IV SOLN: Freq: Once | INTRAVENOUS | Status: AC

## 2023-03-09 MED ORDER — SODIUM CHLORIDE 0.9 % IV SOLN
200.0000 mg | Freq: Once | INTRAVENOUS | Status: AC
  Administered 2023-03-09: 200 mg via INTRAVENOUS
  Filled 2023-03-09: qty 8

## 2023-03-09 NOTE — Progress Notes (Signed)
Pt son spoke with this RN and Lowella Bandy, NP about billing, referral placed for social work.

## 2023-03-09 NOTE — Progress Notes (Signed)
Hematology and Oncology Follow Up Visit  [name] [id number] Jan 04, 1929 89 y.o. 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 3 weeks-s/p cycle #1  -- start on 02/09/2023 Xeloda 1500 mg po BID (14/7) -- start cycle #1 on 02/13/2023   Interim History:  [name] is here today with her family for follow-up and treatment. She is tolerating both Xeloda and Keytruda nicely so far.  I spoke with Dr. Myna Hidalgo to clarify frequency and we will have her taker her Xeloda 2 weeks on and 2 weeks off with Keytruda infusion every 4 weeks. They verbalized understanding and agreement with the plan. Pharmacy has also been updated.  She denies fatigue at this time.  She has been able to get out and do the things that she enjoys.  No issue with fever, chills, n/v, cough, rash, dizziness, SOB, chest pain, palpitations, abdominal pain or changes in bowel or bladder habits.  She takes Miralax BID to help keep her BM's regular.  She has noted less blood with her stool.  Her CEA has remained stable at 2.00.  TSH and T4 pending.  No swelling, tenderness, numbness or tingling in her extremities.  No falls or syncope.  Appetite and hydration are good. Weight is stable at 148 lbs.   ECOG Performance Status: 1 - Symptomatic but completely ambulatory  Medications:  Allergies as of 03/09/2023       Reactions   Prednisone Other (See Comments)   Climbs the walls        Medication List        Accurate as of Kayti 28, 2024  1:00 PM. If you have any questions, ask your nurse or doctor.          acetaminophen 325 MG tablet Commonly known as: TYLENOL Take 2 tablets (650 mg total) by mouth every 6 (six) hours as needed for mild pain (or Fever >/= 101).   ALPRAZolam 0.5 MG tablet Commonly known as: XANAX Take 0.5 mg by mouth 3 (three) times daily as needed for anxiety. For anxiety    amiodarone 200 MG tablet Commonly known as: Pacerone Take 1 tablet (200 mg total) by mouth daily. DO NOT START THIS MEDICATION UNTIL PREVIOUS DOSE (400mg ) HAS BEEN COMPLETED   amLODipine 5 MG tablet Commonly known as: NORVASC Take 1.5 tablets (7.5 mg total) by mouth daily.   apixaban 5 MG Tabs tablet Commonly known as: ELIQUIS Take 1 tablet (5 mg total) by mouth 2 (two) times daily.   atorvastatin 20 MG tablet Commonly known as: LIPITOR Take 20 mg by mouth daily.   capecitabine 500 MG tablet Commonly known as: XELODA Take 3 tablets (1,500 mg total) by mouth 2 (two) times daily after a meal. Take within 30 minutes after meals. Take for 14 days on, 7 days off. Repeat every 21 days.   cholecalciferol 25 MCG (1000 UNIT) tablet Commonly known as: VITAMIN D3 Take 1,000 Units by mouth daily.   cyanocobalamin 1000 MCG tablet Take 1 tablet (1,000 mcg total) by mouth daily.   feeding supplement Liqd Take 237 mLs by mouth 3 (three) times daily with meals.   fluticasone 50 MCG/ACT nasal spray Commonly known as: FLONASE Place 2 sprays into both nostrils daily.   guaiFENesin-dextromethorphan 100-10 MG/5ML syrup Commonly known as: ROBITUSSIN DM Take 10 mLs by mouth every 4 (four) hours as needed for cough.  HYDROcodone-acetaminophen 5-325 MG tablet Commonly known as: NORCO/VICODIN Take 1-2 tablets by mouth every 4 (four) hours as needed for moderate pain.   levothyroxine 125 MCG tablet Commonly known as: SYNTHROID Take 125 mcg by mouth daily before breakfast.   LEXAPRO PO Take 1 tablet by mouth daily.   lidocaine-prilocaine cream Commonly known as: EMLA Apply to affected area once   loratadine 10 MG tablet Commonly known as: CLARITIN Take 1 tablet (10 mg total) by mouth daily.   methocarbamol 500 MG tablet Commonly known as: ROBAXIN Take 1 tablet (500 mg total) by mouth every 8 (eight) hours as needed for muscle spasms (abd pain).   polyethylene glycol 17 g  packet Commonly known as: MIRALAX / GLYCOLAX Take 17 g by mouth daily as needed for mild constipation.   potassium chloride SA 20 MEQ tablet Commonly known as: KLOR-CON M Take 20 mEq by mouth daily.   prochlorperazine 10 MG tablet Commonly known as: COMPAZINE Take 1 tablet (10 mg total) by mouth every 6 (six) hours as needed for nausea or vomiting.        Allergies:  Allergies  Allergen Reactions   Prednisone Other (See Comments)    Climbs the walls    Past Medical History, Surgical history, Social history, and Family History were reviewed and updated.  Review of Systems: All other 10 point review of systems is negative.   Physical Exam:  vitals were not taken for this visit.   Wt Readings from Last 3 Encounters:  02/26/23 144 lb 3.2 oz (65.4 kg)  02/23/23 144 lb 1.9 oz (65.4 kg)  02/08/23 145 lb (65.8 kg)    Ocular: Sclerae unicteric, pupils equal, round and reactive to light Ear-nose-throat: Oropharynx clear, dentition fair Lymphatic: No cervical or supraclavicular adenopathy Lungs no rales or rhonchi, good excursion bilaterally Heart regular rate and rhythm, no murmur appreciated Abd soft, nontender, positive bowel sounds MSK no focal spinal tenderness, no joint edema Neuro: non-focal, well-oriented, appropriate affect Breasts: Deferred   Lab Results  Component Value Date   WBC 6.3 03/09/2023   HGB 11.0 (L) 03/09/2023   HCT 34.1 (L) 03/09/2023   MCV 95.0 03/09/2023   PLT 210 03/09/2023   Lab Results  Component Value Date   FERRITIN 138 02/23/2023   IRON 82 02/23/2023   TIBC 363 02/23/2023   UIBC 281 02/23/2023   IRONPCTSAT 23 02/23/2023   Lab Results  Component Value Date   RETICCTPCT 2.9 02/23/2023   RBC 3.59 (L) 03/09/2023   No results found for: "KPAFRELGTCHN", "LAMBDASER", "KAPLAMBRATIO" No results found for: "IGGSERUM", "IGA", "IGMSERUM" No results found for: "TOTALPROTELP", "ALBUMINELP", "A1GS", "A2GS", "BETS", "BETA2SER", "GAMS",  "MSPIKE", "SPEI"   Chemistry      Component Value Date/Time   NA 134 (L) 02/23/2023 1030   K 4.6 02/23/2023 1030   CL 96 (L) 02/23/2023 1030   CO2 30 02/23/2023 1030   BUN 19 02/23/2023 1030   CREATININE 0.81 02/23/2023 1030      Component Value Date/Time   CALCIUM 10.0 02/23/2023 1030   ALKPHOS 65 02/23/2023 1030   AST 19 02/23/2023 1030   ALT 23 02/23/2023 1030   BILITOT 0.6 02/23/2023 1030       Impression and Plan: [name] is a pleasant 89 yo female with locally advanced adenocarcinoma of the rectum and right upper lobe nodule.  We will proceed with Keytruda today as planned per MD.  She will take her Mammie Lorenzo on 2 weeks and off 2 weeks with Rande Lawman  infusion every 4 weeks.  Follow-up in 4 weeks.   Eileen Stanford, NP 6/28/20241:00 PM

## 2023-03-09 NOTE — Patient Instructions (Signed)
[facility]  Discharge Instructions: Thank you for choosing [facility] to provide your oncology and hematology care.   If you have a lab appointment with the Cancer Center, please go directly to the Cancer Center and check in at the registration area.  Wear comfortable clothing and clothing appropriate for easy access to any Portacath or PICC line.   We strive to give you quality time with your provider. You may need to reschedule your appointment if you arrive late (15 or more minutes).  Arriving late affects you and other patients whose appointments are after yours.  Also, if you miss three or more appointments without notifying the office, you may be dismissed from the clinic at the provider's discretion.      For prescription refill requests, have your pharmacy contact our office and allow 72 hours for refills to be completed.    Today you received the following chemotherapy and/or immunotherapy agents Keytruda      To help prevent nausea and vomiting after your treatment, we encourage you to take your nausea medication as directed.  BELOW ARE SYMPTOMS THAT SHOULD BE REPORTED IMMEDIATELY: *FEVER GREATER THAN 100.4 F (38 C) OR HIGHER *CHILLS OR SWEATING *NAUSEA AND VOMITING THAT IS NOT CONTROLLED WITH YOUR NAUSEA MEDICATION *UNUSUAL SHORTNESS OF BREATH *UNUSUAL BRUISING OR BLEEDING *URINARY PROBLEMS (pain or burning when urinating, or frequent urination) *BOWEL PROBLEMS (unusual diarrhea, constipation, pain near the anus) TENDERNESS IN MOUTH AND THROAT WITH OR WITHOUT PRESENCE OF ULCERS (sore throat, sores in mouth, or a toothache) UNUSUAL RASH, SWELLING OR PAIN  UNUSUAL VAGINAL DISCHARGE OR ITCHING   Items with * indicate a potential emergency and should be followed up as soon as possible or go to the Emergency Department if any problems should occur.  Please show the CHEMOTHERAPY ALERT CARD or IMMUNOTHERAPY ALERT CARD at check-in  to the Emergency Department and triage nurse. Should you have questions after your visit or need to cancel or reschedule your appointment, please contact [facility]  [phone] and follow the prompts.  Office hours are 8:00 a.m. to 4:30 p.m. Monday - Friday. Please note that voicemails left after 4:00 p.m. may not be returned until the following business day.  We are closed weekends and major holidays. You have access to a nurse at all times for urgent questions. Please call the main number to the clinic [phone] and follow the prompts.  For any non-urgent questions, you may also contact your provider using MyChart. We now offer e-Visits for anyone 18 and older to request care online for non-urgent symptoms. For details visit [email or web address].   Also download the MyChart app! Go to the app store, search "MyChart", open the app, select [facility], and log in with your MyChart username and password.   

## 2023-03-09 NOTE — Progress Notes (Signed)
Patient cleared for next treatment. Provided sample bag to patient received from Palliative Care Team.   Oncology Nurse Navigator Documentation     03/09/2023    1:15 PM  Oncology Nurse Navigator Flowsheets  Navigator Follow Up Date: 03/22/2023  Navigator Follow Up Reason: Follow-up Appointment;Chemotherapy  Navigator Location [facility]  Navigator Encounter Type Appt/Treatment Plan Review  Patient Visit Type MedOnc  Treatment Phase Active Tx  Barriers/Navigation Needs Coordination of Care;Education  Interventions Coordination of Care;Psycho-Social Support  Acuity Level 2-Minimal Needs (1-2 Barriers Identified)  Support Groups/Services Friends and Family  Time Spent with Patient 15

## 2023-03-10 LAB — T4: T4, Total: 17 ug/dL — ABNORMAL HIGH (ref 4.5–12.0)

## 2023-03-12 ENCOUNTER — Inpatient Hospital Stay: Payer: Medicare PPO | Attending: Hematology & Oncology

## 2023-03-12 DIAGNOSIS — Z7901 Long term (current) use of anticoagulants: Secondary | ICD-10-CM | POA: Insufficient documentation

## 2023-03-12 DIAGNOSIS — Z79890 Hormone replacement therapy (postmenopausal): Secondary | ICD-10-CM | POA: Insufficient documentation

## 2023-03-12 DIAGNOSIS — J189 Pneumonia, unspecified organism: Secondary | ICD-10-CM | POA: Insufficient documentation

## 2023-03-12 DIAGNOSIS — I4891 Unspecified atrial fibrillation: Secondary | ICD-10-CM | POA: Insufficient documentation

## 2023-03-12 DIAGNOSIS — C7800 Secondary malignant neoplasm of unspecified lung: Secondary | ICD-10-CM | POA: Insufficient documentation

## 2023-03-12 DIAGNOSIS — Z79899 Other long term (current) drug therapy: Secondary | ICD-10-CM | POA: Insufficient documentation

## 2023-03-12 DIAGNOSIS — R54 Age-related physical debility: Secondary | ICD-10-CM | POA: Insufficient documentation

## 2023-03-12 DIAGNOSIS — C20 Malignant neoplasm of rectum: Secondary | ICD-10-CM | POA: Insufficient documentation

## 2023-03-12 NOTE — Progress Notes (Signed)
[facility] CSW Progress Note  Clinical Social Worker contacted patient's son, [name], to discuss financial concerns.  Securely emailed him the Teacher, [employer] and [employer] and grant programs for Colorectal Cancer.    Dorothey Baseman, LCSW Clinical Social Worker [facility]

## 2023-03-14 NOTE — Progress Notes (Signed)
Enrolled [name] into the [employer]

## 2023-03-19 ENCOUNTER — Other Ambulatory Visit: Payer: Self-pay

## 2023-03-19 ENCOUNTER — Other Ambulatory Visit (HOSPITAL_COMMUNITY): Payer: Self-pay

## 2023-03-19 ENCOUNTER — Encounter: Payer: Self-pay | Admitting: Hematology & Oncology

## 2023-03-19 ENCOUNTER — Inpatient Hospital Stay (HOSPITAL_COMMUNITY): Payer: Medicare PPO

## 2023-03-19 ENCOUNTER — Inpatient Hospital Stay (HOSPITAL_BASED_OUTPATIENT_CLINIC_OR_DEPARTMENT_OTHER): Payer: Medicare PPO | Admitting: Hematology & Oncology

## 2023-03-19 ENCOUNTER — Encounter (HOSPITAL_COMMUNITY): Payer: Self-pay | Admitting: Emergency Medicine

## 2023-03-19 ENCOUNTER — Inpatient Hospital Stay: Payer: Medicare PPO

## 2023-03-19 ENCOUNTER — Inpatient Hospital Stay (HOSPITAL_COMMUNITY)
Admission: EM | Admit: 2023-03-19 | Discharge: 2023-03-30 | DRG: 356 | Disposition: A | Discharge status: Home / Self Care | Payer: Medicare PPO | Attending: Family Medicine | Admitting: Family Medicine

## 2023-03-19 VITALS — BP 156/44 | HR 89 | Temp 98.0°F | Resp 18 | Ht 60.0 in | Wt 149.0 lb

## 2023-03-19 DIAGNOSIS — J209 Acute bronchitis, unspecified: Secondary | ICD-10-CM | POA: Diagnosis not present

## 2023-03-19 DIAGNOSIS — J45909 Unspecified asthma, uncomplicated: Secondary | ICD-10-CM | POA: Diagnosis present

## 2023-03-19 DIAGNOSIS — R944 Abnormal results of kidney function studies: Secondary | ICD-10-CM | POA: Diagnosis present

## 2023-03-19 DIAGNOSIS — E785 Hyperlipidemia, unspecified: Secondary | ICD-10-CM | POA: Diagnosis present

## 2023-03-19 DIAGNOSIS — R062 Wheezing: Secondary | ICD-10-CM

## 2023-03-19 DIAGNOSIS — K922 Gastrointestinal hemorrhage, unspecified: Secondary | ICD-10-CM | POA: Diagnosis present

## 2023-03-19 DIAGNOSIS — D131 Benign neoplasm of stomach: Secondary | ICD-10-CM | POA: Diagnosis not present

## 2023-03-19 DIAGNOSIS — K644 Residual hemorrhoidal skin tags: Secondary | ICD-10-CM | POA: Diagnosis present

## 2023-03-19 DIAGNOSIS — I350 Nonrheumatic aortic (valve) stenosis: Secondary | ICD-10-CM | POA: Diagnosis present

## 2023-03-19 DIAGNOSIS — C7800 Secondary malignant neoplasm of unspecified lung: Secondary | ICD-10-CM | POA: Diagnosis not present

## 2023-03-19 DIAGNOSIS — B9620 Unspecified Escherichia coli [E. coli] as the cause of diseases classified elsewhere: Secondary | ICD-10-CM | POA: Diagnosis not present

## 2023-03-19 DIAGNOSIS — F419 Anxiety disorder, unspecified: Secondary | ICD-10-CM | POA: Diagnosis present

## 2023-03-19 DIAGNOSIS — J189 Pneumonia, unspecified organism: Secondary | ICD-10-CM

## 2023-03-19 DIAGNOSIS — Z79890 Hormone replacement therapy (postmenopausal): Secondary | ICD-10-CM | POA: Diagnosis not present

## 2023-03-19 DIAGNOSIS — J181 Lobar pneumonia, unspecified organism: Secondary | ICD-10-CM | POA: Diagnosis not present

## 2023-03-19 DIAGNOSIS — Z79899 Other long term (current) drug therapy: Secondary | ICD-10-CM

## 2023-03-19 DIAGNOSIS — E032 Hypothyroidism due to medicaments and other exogenous substances: Secondary | ICD-10-CM

## 2023-03-19 DIAGNOSIS — K317 Polyp of stomach and duodenum: Secondary | ICD-10-CM | POA: Diagnosis present

## 2023-03-19 DIAGNOSIS — Z1509 Genetic susceptibility to other malignant neoplasm: Secondary | ICD-10-CM

## 2023-03-19 DIAGNOSIS — Z5986 Financial insecurity: Secondary | ICD-10-CM

## 2023-03-19 DIAGNOSIS — R7401 Elevation of levels of liver transaminase levels: Secondary | ICD-10-CM | POA: Diagnosis not present

## 2023-03-19 DIAGNOSIS — I4891 Unspecified atrial fibrillation: Secondary | ICD-10-CM | POA: Diagnosis not present

## 2023-03-19 DIAGNOSIS — K6289 Other specified diseases of anus and rectum: Secondary | ICD-10-CM | POA: Diagnosis not present

## 2023-03-19 DIAGNOSIS — K5731 Diverticulosis of large intestine without perforation or abscess with bleeding: Secondary | ICD-10-CM | POA: Diagnosis present

## 2023-03-19 DIAGNOSIS — F17210 Nicotine dependence, cigarettes, uncomplicated: Secondary | ICD-10-CM | POA: Diagnosis present

## 2023-03-19 DIAGNOSIS — J440 Chronic obstructive pulmonary disease with acute lower respiratory infection: Secondary | ICD-10-CM | POA: Diagnosis present

## 2023-03-19 DIAGNOSIS — I10 Essential (primary) hypertension: Secondary | ICD-10-CM | POA: Diagnosis not present

## 2023-03-19 DIAGNOSIS — I2510 Atherosclerotic heart disease of native coronary artery without angina pectoris: Secondary | ICD-10-CM | POA: Diagnosis not present

## 2023-03-19 DIAGNOSIS — D630 Anemia in neoplastic disease: Secondary | ICD-10-CM | POA: Diagnosis present

## 2023-03-19 DIAGNOSIS — K2950 Unspecified chronic gastritis without bleeding: Secondary | ICD-10-CM | POA: Diagnosis not present

## 2023-03-19 DIAGNOSIS — Z8673 Personal history of transient ischemic attack (TIA), and cerebral infarction without residual deficits: Secondary | ICD-10-CM

## 2023-03-19 DIAGNOSIS — D510 Vitamin B12 deficiency anemia due to intrinsic factor deficiency: Secondary | ICD-10-CM

## 2023-03-19 DIAGNOSIS — E039 Hypothyroidism, unspecified: Secondary | ICD-10-CM | POA: Diagnosis not present

## 2023-03-19 DIAGNOSIS — Z9049 Acquired absence of other specified parts of digestive tract: Secondary | ICD-10-CM

## 2023-03-19 DIAGNOSIS — K31819 Angiodysplasia of stomach and duodenum without bleeding: Secondary | ICD-10-CM | POA: Diagnosis not present

## 2023-03-19 DIAGNOSIS — D509 Iron deficiency anemia, unspecified: Secondary | ICD-10-CM

## 2023-03-19 DIAGNOSIS — E440 Moderate protein-calorie malnutrition: Secondary | ICD-10-CM

## 2023-03-19 DIAGNOSIS — Z7901 Long term (current) use of anticoagulants: Secondary | ICD-10-CM | POA: Diagnosis not present

## 2023-03-19 DIAGNOSIS — Z86010 Personal history of colonic polyps: Secondary | ICD-10-CM

## 2023-03-19 DIAGNOSIS — D500 Iron deficiency anemia secondary to blood loss (chronic): Secondary | ICD-10-CM | POA: Diagnosis not present

## 2023-03-19 DIAGNOSIS — I700 Atherosclerosis of aorta: Secondary | ICD-10-CM | POA: Diagnosis not present

## 2023-03-19 DIAGNOSIS — K2971 Gastritis, unspecified, with bleeding: Secondary | ICD-10-CM | POA: Diagnosis present

## 2023-03-19 DIAGNOSIS — C20 Malignant neoplasm of rectum: Secondary | ICD-10-CM | POA: Diagnosis not present

## 2023-03-19 DIAGNOSIS — K449 Diaphragmatic hernia without obstruction or gangrene: Secondary | ICD-10-CM | POA: Diagnosis not present

## 2023-03-19 DIAGNOSIS — D62 Acute posthemorrhagic anemia: Secondary | ICD-10-CM | POA: Diagnosis not present

## 2023-03-19 DIAGNOSIS — K625 Hemorrhage of anus and rectum: Secondary | ICD-10-CM

## 2023-03-19 DIAGNOSIS — E119 Type 2 diabetes mellitus without complications: Secondary | ICD-10-CM | POA: Diagnosis not present

## 2023-03-19 DIAGNOSIS — J9601 Acute respiratory failure with hypoxia: Secondary | ICD-10-CM | POA: Diagnosis not present

## 2023-03-19 DIAGNOSIS — Z800 Family history of malignant neoplasm of digestive organs: Secondary | ICD-10-CM

## 2023-03-19 DIAGNOSIS — I480 Paroxysmal atrial fibrillation: Secondary | ICD-10-CM | POA: Diagnosis not present

## 2023-03-19 DIAGNOSIS — M1990 Unspecified osteoarthritis, unspecified site: Secondary | ICD-10-CM | POA: Diagnosis present

## 2023-03-19 DIAGNOSIS — D649 Anemia, unspecified: Secondary | ICD-10-CM | POA: Diagnosis not present

## 2023-03-19 DIAGNOSIS — I495 Sick sinus syndrome: Secondary | ICD-10-CM | POA: Diagnosis present

## 2023-03-19 DIAGNOSIS — I77819 Aortic ectasia, unspecified site: Secondary | ICD-10-CM | POA: Diagnosis not present

## 2023-03-19 DIAGNOSIS — R911 Solitary pulmonary nodule: Secondary | ICD-10-CM | POA: Diagnosis not present

## 2023-03-19 DIAGNOSIS — E538 Deficiency of other specified B group vitamins: Secondary | ICD-10-CM | POA: Diagnosis present

## 2023-03-19 DIAGNOSIS — N3000 Acute cystitis without hematuria: Secondary | ICD-10-CM

## 2023-03-19 DIAGNOSIS — Z87891 Personal history of nicotine dependence: Secondary | ICD-10-CM | POA: Diagnosis not present

## 2023-03-19 DIAGNOSIS — K921 Melena: Secondary | ICD-10-CM | POA: Diagnosis not present

## 2023-03-19 DIAGNOSIS — I70202 Unspecified atherosclerosis of native arteries of extremities, left leg: Secondary | ICD-10-CM | POA: Diagnosis not present

## 2023-03-19 DIAGNOSIS — K31811 Angiodysplasia of stomach and duodenum with bleeding: Principal | ICD-10-CM | POA: Diagnosis present

## 2023-03-19 DIAGNOSIS — M810 Age-related osteoporosis without current pathological fracture: Secondary | ICD-10-CM | POA: Diagnosis present

## 2023-03-19 DIAGNOSIS — Z833 Family history of diabetes mellitus: Secondary | ICD-10-CM

## 2023-03-19 DIAGNOSIS — K5730 Diverticulosis of large intestine without perforation or abscess without bleeding: Secondary | ICD-10-CM | POA: Diagnosis not present

## 2023-03-19 DIAGNOSIS — K429 Umbilical hernia without obstruction or gangrene: Secondary | ICD-10-CM | POA: Diagnosis not present

## 2023-03-19 DIAGNOSIS — K2970 Gastritis, unspecified, without bleeding: Secondary | ICD-10-CM | POA: Diagnosis not present

## 2023-03-19 DIAGNOSIS — R918 Other nonspecific abnormal finding of lung field: Secondary | ICD-10-CM | POA: Diagnosis not present

## 2023-03-19 DIAGNOSIS — K284 Chronic or unspecified gastrojejunal ulcer with hemorrhage: Secondary | ICD-10-CM

## 2023-03-19 DIAGNOSIS — J90 Pleural effusion, not elsewhere classified: Secondary | ICD-10-CM | POA: Diagnosis not present

## 2023-03-19 DIAGNOSIS — Z856 Personal history of leukemia: Secondary | ICD-10-CM

## 2023-03-19 DIAGNOSIS — K5900 Constipation, unspecified: Secondary | ICD-10-CM | POA: Diagnosis not present

## 2023-03-19 DIAGNOSIS — Z888 Allergy status to other drugs, medicaments and biological substances status: Secondary | ICD-10-CM

## 2023-03-19 LAB — CBC WITH DIFFERENTIAL (CANCER CENTER ONLY)
Abs Immature Granulocytes: 0.06 10*3/uL (ref 0.00–0.07)
Basophils Absolute: 0.1 10*3/uL (ref 0.0–0.1)
Basophils Relative: 1 %
Eosinophils Absolute: 0.1 10*3/uL (ref 0.0–0.5)
Eosinophils Relative: 1 %
HCT: 26 % — ABNORMAL LOW (ref 36.0–46.0)
Hemoglobin: 8.2 g/dL — ABNORMAL LOW (ref 12.0–15.0)
Immature Granulocytes: 1 %
Lymphocytes Relative: 34 %
Lymphs Abs: 2.3 10*3/uL (ref 0.7–4.0)
MCH: 31.5 pg (ref 26.0–34.0)
MCHC: 31.5 g/dL (ref 30.0–36.0)
MCV: 100 fL (ref 80.0–100.0)
Monocytes Absolute: 0.8 10*3/uL (ref 0.1–1.0)
Monocytes Relative: 11 %
Neutro Abs: 3.7 10*3/uL (ref 1.7–7.7)
Neutrophils Relative %: 52 %
Platelet Count: 247 10*3/uL (ref 150–400)
RBC: 2.6 MIL/uL — ABNORMAL LOW (ref 3.87–5.11)
RDW: 23.8 % — ABNORMAL HIGH (ref 11.5–15.5)
WBC Count: 7 10*3/uL (ref 4.0–10.5)
nRBC: 0 % (ref 0.0–0.2)

## 2023-03-19 LAB — CMP (CANCER CENTER ONLY)
ALT: 52 U/L — ABNORMAL HIGH (ref 0–44)
AST: 39 U/L (ref 15–41)
Albumin: 4 g/dL (ref 3.5–5.0)
Alkaline Phosphatase: 64 U/L (ref 38–126)
Anion gap: 9 (ref 5–15)
BUN: 24 mg/dL — ABNORMAL HIGH (ref 8–23)
CO2: 28 mmol/L (ref 22–32)
Calcium: 9.5 mg/dL (ref 8.9–10.3)
Chloride: 97 mmol/L — ABNORMAL LOW (ref 98–111)
Creatinine: 0.66 mg/dL (ref 0.44–1.00)
GFR, Estimated: >60 mL/min (ref >60)
Glucose, Bld: 165 mg/dL — ABNORMAL HIGH (ref 70–99)
Potassium: 4 mmol/L (ref 3.5–5.1)
Sodium: 134 mmol/L — ABNORMAL LOW (ref 135–145)
Total Bilirubin: 0.8 mg/dL (ref 0.3–1.2)
Total Protein: 6.7 g/dL (ref 6.5–8.1)

## 2023-03-19 LAB — CBC
HCT: 24.5 % — ABNORMAL LOW (ref 36.0–46.0)
Hemoglobin: 7.9 g/dL — ABNORMAL LOW (ref 12.0–15.0)
MCH: 31.7 pg (ref 26.0–34.0)
MCHC: 32.2 g/dL (ref 30.0–36.0)
MCV: 98.4 fL (ref 80.0–100.0)
Platelets: 214 10*3/uL (ref 150–400)
RBC: 2.49 MIL/uL — ABNORMAL LOW (ref 3.87–5.11)
RDW: 24 % — ABNORMAL HIGH (ref 11.5–15.5)
WBC: 5.2 10*3/uL (ref 4.0–10.5)
nRBC: 0 % (ref 0.0–0.2)

## 2023-03-19 LAB — MAGNESIUM: Magnesium: 2.2 mg/dL (ref 1.7–2.4)

## 2023-03-19 LAB — PHOSPHORUS: Phosphorus: 4.2 mg/dL (ref 2.5–4.6)

## 2023-03-19 LAB — TYPE AND SCREEN
ABO/RH(D): O POS
Antibody Screen: NEGATIVE

## 2023-03-19 LAB — CEA (ACCESS): CEA (CHCC): 1.04 ng/mL (ref 0.00–5.00)

## 2023-03-19 LAB — FERRITIN: Ferritin: 57 ng/mL (ref 11–307)

## 2023-03-19 LAB — CK: Total CK: 48 U/L (ref 38–234)

## 2023-03-19 MED ORDER — PANTOPRAZOLE SODIUM 40 MG IV SOLR: 40.0000 mg | Freq: Two times a day (BID) | INTRAVENOUS | Status: DC

## 2023-03-19 MED ORDER — INSULIN ASPART 100 UNIT/ML IJ SOLN
0.0000 [IU] | INTRAMUSCULAR | Status: DC
  Administered 2023-03-20 (×3): 1 [IU] via SUBCUTANEOUS
  Administered 2023-03-22: 2 [IU] via SUBCUTANEOUS
  Administered 2023-03-23: 1 [IU] via SUBCUTANEOUS
  Administered 2023-03-23: 3 [IU] via SUBCUTANEOUS
  Administered 2023-03-23 (×2): 1 [IU] via SUBCUTANEOUS
  Administered 2023-03-24: 2 [IU] via SUBCUTANEOUS
  Administered 2023-03-24 – 2023-03-25 (×2): 1 [IU] via SUBCUTANEOUS
  Administered 2023-03-25: 2 [IU] via SUBCUTANEOUS
  Administered 2023-03-25: 1 [IU] via SUBCUTANEOUS
  Administered 2023-03-25 – 2023-03-26 (×2): 2 [IU] via SUBCUTANEOUS
  Administered 2023-03-27: 1 [IU] via SUBCUTANEOUS
  Administered 2023-03-27: 3 [IU] via SUBCUTANEOUS
  Administered 2023-03-28 – 2023-03-30 (×6): 2 [IU] via SUBCUTANEOUS
  Filled 2023-03-19: qty 0.09

## 2023-03-19 MED ORDER — PANTOPRAZOLE SODIUM 40 MG IV SOLR
40.0000 mg | Freq: Once | INTRAVENOUS | Status: AC
  Administered 2023-03-19: 40 mg via INTRAVENOUS
  Filled 2023-03-19: qty 10

## 2023-03-19 MED ORDER — IOHEXOL 350 MG/ML SOLN
100.0000 mL | Freq: Once | INTRAVENOUS | Status: AC | PRN
  Administered 2023-03-19: 100 mL via INTRAVENOUS

## 2023-03-19 MED ORDER — ALPRAZOLAM 0.5 MG PO TABS
0.5000 mg | ORAL_TABLET | Freq: Once | ORAL | Status: AC
  Administered 2023-03-19: 0.5 mg via ORAL
  Filled 2023-03-19: qty 1

## 2023-03-19 MED ORDER — SODIUM CHLORIDE 0.9% IV SOLUTION: Freq: Once | INTRAVENOUS | Status: AC

## 2023-03-19 MED ORDER — ALBUTEROL SULFATE (2.5 MG/3ML) 0.083% IN NEBU
2.5000 mg | INHALATION_SOLUTION | RESPIRATORY_TRACT | Status: DC | PRN
  Administered 2023-03-20 – 2023-03-22 (×2): 2.5 mg via RESPIRATORY_TRACT
  Filled 2023-03-19: qty 3

## 2023-03-19 MED ORDER — SODIUM CHLORIDE (PF) 0.9 % IJ SOLN
INTRAMUSCULAR | Status: AC
  Filled 2023-03-19: qty 50

## 2023-03-19 MED ORDER — PANTOPRAZOLE INFUSION (NEW) - SIMPLE MED
8.0000 mg/h | INTRAVENOUS | Status: DC
  Administered 2023-03-19 – 2023-03-22 (×3): 8 mg/h via INTRAVENOUS
  Filled 2023-03-19 (×2): qty 80
  Filled 2023-03-19: qty 100
  Filled 2023-03-19: qty 80
  Filled 2023-03-19: qty 100

## 2023-03-19 NOTE — Assessment & Plan Note (Signed)
-   Order Sensitive  SSI     -  check TSH and HgA1C  - Hold by mouth medications*  

## 2023-03-19 NOTE — Assessment & Plan Note (Signed)
Transfuse 1 unit obtaine serial cbc

## 2023-03-19 NOTE — ED Notes (Signed)
RN attempted to start IV and get labs patient and daughter refused stating oncologist just drew labs and she's not allowing [place] to. MD notified.

## 2023-03-19 NOTE — Assessment & Plan Note (Signed)
Obtain CXR prior hx of smoking  Hx of spiculated mass  Will eval fro any bronchiolar obstruction  albuterol PRn

## 2023-03-19 NOTE — Assessment & Plan Note (Signed)
-   Check TSH continue home medications Synthroid at  125 mcg po q day  

## 2023-03-19 NOTE — Assessment & Plan Note (Addendum)
-   Glasgow Blatchford score BUN >18.2  Hg  <51F   Admit to progressive pt on anticoagulation     - sent msg to gastroenterology (  [facility]) they will see patient in a.m. appreciate their consult   - serial CBC.    - Monitor for any recurrence,  evidence of hemodynamic instability or significant blood loss  - Transfuse as needed for hemoglobin below 7 or evidence of life-threatening bleeding  - Establish at least 2 PIV and fluid resuscitate   - clear liquids for tonight keep nothing by mouth post midnight,   -  administer Protonix  drip  Given hx of rectal cancer and some BRBPR will obtain CTA abd/pelvis gi bleed

## 2023-03-19 NOTE — ED Provider Notes (Signed)
[place] EMERGENCY DEPARTMENT AT [facility] Provider Note   CSN: [id number] Arrival date & time: 03/19/23  1646     History {Add pertinent medical, surgical, social history, OB history to HPI:1} Chief Complaint  Patient presents with  . Rectal Bleeding    [name] is a 89 y.o. female.  HPI Patient went to Dr. Gustavo Lah office today with report of having black stool.  Patient is anticoagulated on liquids.  She is being treated for adenocarcinoma of the rectum and possible pulmonary metastases.  She is getting treated with chemotherapy and taking Eliquis for atrial fibrillation.  Dr. Myna Hidalgo did her rectal exam and the stool was dark and grossly heme positive.  Lab work have been done and hemoglobin change to 8.2 from 11 ten days ago.  Dr. Myna Hidalgo called and recommended admission for the patient.  The patient reports that she has been feeling fatigued.  She has not had any syncopal episodes.  She denies chest pain.  She reports she experiences some shortness of breath but she does not think is a lot worse than usual for her.  No vomiting.    Home Medications Prior to Admission medications   Medication Sig Start Date End Date Taking? Authorizing Provider  acetaminophen (TYLENOL) 325 MG tablet Take 2 tablets (650 mg total) by mouth every 6 (six) hours as needed for mild pain (or Fever >/= 101). 01/28/23  Yes Azucena Fallen, MD  ALPRAZolam Prudy Feeler) 0.5 MG tablet Take 0.5 mg by mouth 3 (three) times daily as needed for anxiety. For anxiety   Yes [provider]  amiodarone (PACERONE) 200 MG tablet Take 1 tablet (200 mg total) by mouth daily. DO NOT START THIS MEDICATION UNTIL PREVIOUS DOSE (400mg ) HAS BEEN COMPLETED Patient taking differently: Take 200 mg by mouth daily. 02/05/23  Yes Azucena Fallen, MD  amLODipine (NORVASC) 5 MG tablet Take 1.5 tablets (7.5 mg total) by mouth daily. 02/26/23 05/27/23 Yes Weaver, Scott T, PA-C  apixaban (ELIQUIS) 5 MG TABS  tablet Take 1 tablet (5 mg total) by mouth 2 (two) times daily. 02/27/23  Yes Weaver, Scott T, PA-C  atorvastatin (LIPITOR) 20 MG tablet Take 20 mg by mouth daily.   Yes [provider]  capecitabine (XELODA) 500 MG tablet Take 3 tablets (1,500 mg total) by mouth 2 (two) times daily after a meal. Take within 30 minutes after meals. Take for 14 days on, 7 days off. Repeat every 21 days. 02/09/23  Yes Josph Macho, MD  cholecalciferol (VITAMIN D3) 25 MCG (1000 UNIT) tablet Take 1,000 Units by mouth daily.   Yes [provider]  cyanocobalamin 1000 MCG tablet Take 1 tablet (1,000 mcg total) by mouth daily. 01/28/23  Yes Azucena Fallen, MD  escitalopram (LEXAPRO) 5 MG tablet Take 5 mg by mouth at bedtime.   Yes [provider]  famotidine (PEPCID) 20 MG tablet Take 20 mg by mouth daily.   Yes [provider]  feeding supplement (ENSURE ENLIVE / ENSURE PLUS) LIQD Take 237 mLs by mouth 3 (three) times daily with meals. Patient taking differently: Take 237 mLs by mouth 3 (three) times daily as needed (nutrition). 01/28/23  Yes Azucena Fallen, MD  fluticasone [facility]) 50 MCG/ACT nasal spray Place 2 sprays into both nostrils daily. Patient taking differently: Place 2 sprays into both nostrils daily as needed for allergies. 01/28/23  Yes Azucena Fallen, MD  guaiFENesin-dextromethorphan (ROBITUSSIN DM) 100-10 MG/5ML syrup Take 10 mLs by mouth  every 4 (four) hours as needed for cough. 01/28/23  Yes Azucena Fallen, MD  HYDROcodone-acetaminophen (NORCO/VICODIN) 5-325 MG tablet Take 1-2 tablets by mouth every 4 (four) hours as needed for moderate pain. 01/28/23  Yes Azucena Fallen, MD  levothyroxine (SYNTHROID) 125 MCG tablet Take 125 mcg by mouth daily before breakfast.   Yes [provider]  loratadine (CLARITIN) 10 MG tablet Take 1 tablet (10 mg total) by mouth daily. 01/28/23  Yes Azucena Fallen, MD  methocarbamol (ROBAXIN) 500 MG  tablet Take 1 tablet (500 mg total) by mouth every 8 (eight) hours as needed for muscle spasms (abd pain). 01/28/23  Yes Azucena Fallen, MD  polyethylene glycol (MIRALAX / GLYCOLAX) 17 g packet Take 17 g by mouth daily as needed for mild constipation. Patient taking differently: Take 17 g by mouth at bedtime. 01/28/23  Yes Azucena Fallen, MD  potassium chloride SA (K-DUR,KLOR-CON) 20 MEQ tablet Take 20 mEq by mouth daily.   Yes [provider]  prochlorperazine (COMPAZINE) 10 MG tablet Take 1 tablet (10 mg total) by mouth every 6 (six) hours as needed for nausea or vomiting. 02/08/23  Yes Ennever, Rose Phi, MD  lidocaine-prilocaine (EMLA) cream Apply to affected area once 02/08/23   Josph Macho, MD      Allergies    Prednisone and Aggrenox [aspirin-dipyridamole er]    Review of Systems   Review of Systems  Physical Exam Updated Vital Signs BP (!) 147/59   Pulse 84   Temp 98.4 F (36.9 C) (Oral)   Resp 15   Ht 5' (1.524 m)   Wt 67.9 kg   SpO2 95%   BMI 29.22 kg/m  Physical Exam Constitutional:      Comments: Patient is alert no acute distress.  No respiratory distress.  Mental status clear.  Good physical condition for age.  HENT:     Head: Normocephalic and atraumatic.     Mouth/Throat:     Pharynx: Oropharynx is clear.  Eyes:     Extraocular Movements: Extraocular movements intact.  Cardiovascular:     Rate and Rhythm: Normal rate and regular rhythm.  Pulmonary:     Comments: No respiratory wheeze.  Occasional fine expiratory wheeze bases and midlung fields. Abdominal:     General: There is no distension.     Palpations: Abdomen is soft.     Tenderness: There is no abdominal tenderness. There is no guarding.  Musculoskeletal:        General: No swelling. Normal range of motion.     Right lower leg: No edema.     Left lower leg: No edema.  Skin:    General: Skin is warm and dry.     Coloration: Skin is pale.  Neurological:     General: No focal  deficit present.     Mental Status: She is oriented to person, place, and time.     Coordination: Coordination normal.  Psychiatric:        Mood and Affect: Mood normal.    ED Results / Procedures / Treatments   Labs (all labs ordered are listed, but only abnormal results are displayed) Labs Reviewed  CBC - Abnormal; Notable for the following components:      Result Value   RBC 2.49 (*)    Hemoglobin 7.9 (*)    HCT 24.5 (*)    RDW 24.0 (*)    All other components within normal limits  CK  MAGNESIUM  PHOSPHORUS  CBC  TSH  PREALBUMIN  CBC  CBC  TYPE AND SCREEN  PREPARE RBC (CROSSMATCH)    EKG EKG Interpretation Date/Time:  Monday March 19 2023 19:55:42 EDT Ventricular Rate:  81 PR Interval:  185 QRS Duration:  77 QT Interval:  398 QTC Calculation: 462 R Axis:   76  Text Interpretation: Sinus rhythm Probable anteroseptal infarct, old no sig change from prevous Confirmed by Arby Barrette [phone]) on 03/20/2023 12:10:59 AM  Radiology DG Chest 2 View  Result Date: 03/20/2023 CLINICAL DATA:  Wheezing. EXAM: CHEST - 2 VIEW COMPARISON:  Chest radiograph 01/26/2023.  Chest CT 01/20/2023 FINDINGS: Chronic bronchial thickening. Right upper lobe nodular opacity in the periphery is more peripherally located than nodule on prior CT. No new airspace disease. No pleural fluid. The heart is normal in size. Aortic atherosclerosis. No pneumothorax. On limited assessment, no acute osseous findings. IMPRESSION: 1. Chronic bronchial thickening. 2. Right upper lobe nodular opacity is more peripherally located than nodule on prior CT. Unclear if this is the same nodule or a new nodular density. Electronically Signed   By: Narda Rutherford M.D.   On: 03/20/2023 00:01    Procedures Procedures  {Document cardiac monitor, telemetry assessment procedure when appropriate:1}  Medications Ordered in ED Medications  pantoprozole (PROTONIX) 80 mg /NS 100 mL infusion (8 mg/hr Intravenous New Bag/Given  03/19/23 2245)  pantoprazole (PROTONIX) injection 40 mg (has no administration in time range)  0.9 %  sodium chloride infusion (Manually program via Guardrails IV Fluids) (has no administration in time range)  insulin aspart (novoLOG) injection 0-9 Units (has no administration in time range)  albuterol (PROVENTIL) (2.5 MG/3ML) 0.083% nebulizer solution 2.5 mg (has no administration in time range)  ALPRAZolam Prudy Feeler) tablet 0.5 mg (0.5 mg Oral Given 03/19/23 2035)  pantoprazole (PROTONIX) injection 40 mg (40 mg Intravenous Given 03/19/23 2054)  pantoprazole (PROTONIX) injection 40 mg (40 mg Intravenous Given 03/19/23 2235)  iohexol (OMNIPAQUE) 350 MG/ML injection 100 mL (100 mLs Intravenous Contrast Given 03/19/23 2340)    ED Course/ Medical Decision Making/ A&P   {   Click here for ABCD2, HEART and other calculatorsREFRESH Note before signing :1}                          Medical Decision Making Amount and/or Complexity of Data Reviewed Labs: ordered.  Risk Prescription drug management. Decision regarding hospitalization.   Patient presents as outlined.  At this time plan will be to repeat CBC and monitor on inpatient basis.  Patient has high risk for significant anemia with anticoagulation with Eliquis.  {Document critical care time when appropriate:1} {Document review of labs and clinical decision tools ie heart score, Chads2Vasc2 etc:1}  {Document your independent review of radiology images, and any outside records:1} {Document your discussion with family members, caretakers, and with consultants:1} {Document social determinants of health affecting pt's care:1} {Document your decision making why or why not admission, treatments were needed:1} Final Clinical Impression(s) / ED Diagnoses Final diagnoses:  Gastrointestinal hemorrhage, unspecified gastrointestinal hemorrhage type  Anticoagulated by anticoagulation treatment    Rx / DC Orders ED Discharge Orders     None

## 2023-03-19 NOTE — Assessment & Plan Note (Signed)
Hold eliquis  Restart amiodarone

## 2023-03-19 NOTE — Subjective & Objective (Signed)
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  There is no mass in the rectal vault.  Her stool is black and heme positive.  Hg was 8.2 down from 11 10 days aog She was sent to ER by her Oncologist

## 2023-03-19 NOTE — ED Provider Triage Note (Cosign Needed Addendum)
Emergency Medicine Provider Triage Evaluation Note  [name] , a 89 y.o. female  was evaluated in triage.  Pt complains of blood in stool x1 day. She has a history of colorectal cancer. She was seen by her oncologist today and had a positive FOBT test in office. Labs were drawn there and she was sent here to get admitted for further workup - her Hgb was 8.2.  Review of Systems  Positive: Weakness Negative: Lightheaded, abdominal pain  Physical Exam  BP (!) 151/55   Pulse 85   Temp 97.9 F (36.6 C) (Oral)   Resp 16   Ht 5' (1.524 m)   Wt 67.9 kg   SpO2 93%   BMI 29.22 kg/m  Gen:   Awake, no distress   Resp:  Normal effort  MSK:   Moves extremities without difficulty  Other:    Medical Decision Making  Medically screening exam initiated at 5:23 PM.  Appropriate orders placed.  [name] was informed that the remainder of the evaluation will be completed by another provider, this initial triage assessment does not replace that evaluation, and the importance of remaining in the ED until their evaluation is complete.   Maxwell Marion, PA-C 03/19/23 1724    Maxwell Marion, PA-C 03/19/23 1739

## 2023-03-19 NOTE — ED Notes (Signed)
IV team consulted per Dr Jonny Ruiz. Pt has one 20G in R arm, attempted 3 times. Pt is a difficult stick and needs 2 Ivs due to blood admin and need for meds

## 2023-03-19 NOTE — Assessment & Plan Note (Signed)
Continue lipitor 20mg po q day 

## 2023-03-19 NOTE — Progress Notes (Signed)
Hematology and Oncology Follow Up Visit  [name] [id number] Jan 05, 1929 89 y.o. 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 3 weeks-s/p cycle #1  -- start on 02/09/2023 Xeloda 1500 mg po BID (14/7) -- s/p cycle #3 on 02/13/2023     Interim History:  [name] is back for an unscheduled visit.  Apparently, her son called earlier this morning saying that she was having black stool.  She is on blood thinner because of history of atrial fibrillation..  She said it just started today.  She has had no abdominal pain.  She has had a little bit more nausea.  She has had no vomiting.  She has had no hematuria.  There is been no hematochezia.  Of note, her last CEA level was 1.27.  She also is getting Keytruda.  Last Keytruda dose was on 03/09/2023.  She currently has 1 more day of Xeloda.  I told her just to go ahead and stop taking the Xeloda for right now.  We did do a rectal exam on her.  Her stool was dark and grossly heme positive.  She has had no fever.  She has had no rashes.  She has had no bleeding elsewhere.  At the present time, I would say that her performance status is probably ECOG 1.   Medications:  Current Outpatient Medications:    acetaminophen (TYLENOL) 325 MG tablet, Take 2 tablets (650 mg total) by mouth every 6 (six) hours as needed for mild pain (or Fever >/= 101)., Disp: 30 tablet, Rfl: 0   ALPRAZolam (XANAX) 0.5 MG tablet, Take 0.5 mg by mouth 3 (three) times daily as needed for anxiety. For anxiety, Disp: , Rfl:    amiodarone (PACERONE) 200 MG tablet, Take 1 tablet (200 mg total) by mouth daily. DO NOT START THIS MEDICATION UNTIL PREVIOUS DOSE (400mg ) HAS BEEN COMPLETED (Patient taking differently: Take 200 mg by mouth daily. DO NOT START THIS MEDICATION UNTIL PREVIOUS DOSE (400mg ) HAS BEEN COMPLETED. Takes in AM), Disp: 30 tablet, Rfl: 0    amLODipine (NORVASC) 5 MG tablet, Take 1.5 tablets (7.5 mg total) by mouth daily., Disp: 135 tablet, Rfl: 3   apixaban (ELIQUIS) 5 MG TABS tablet, Take 1 tablet (5 mg total) by mouth 2 (two) times daily., Disp: 180 tablet, Rfl: 1   atorvastatin (LIPITOR) 20 MG tablet, Take 20 mg by mouth daily., Disp: , Rfl:    capecitabine (XELODA) 500 MG tablet, Take 3 tablets (1,500 mg total) by mouth 2 (two) times daily after a meal. Take within 30 minutes after meals. Take for 14 days on, 7 days off. Repeat every 21 days., Disp: 84 tablet, Rfl: 5   cholecalciferol (VITAMIN D3) 25 MCG (1000 UNIT) tablet, Take 1,000 Units by mouth daily., Disp: , Rfl:    cyanocobalamin 1000 MCG tablet, Take 1 tablet (1,000 mcg total) by mouth daily., Disp: 30 tablet, Rfl: 0   Escitalopram Oxalate (LEXAPRO PO), Take 1 tablet by mouth daily. 5 mg, Disp: , Rfl:    famotidine (PEPCID) 20 MG tablet, Take 20 mg by mouth daily., Disp: , Rfl:    feeding supplement (ENSURE ENLIVE / ENSURE PLUS) LIQD, Take 237 mLs by mouth 3 (three) times daily with meals., Disp: 237 mL, Rfl: 12   fluticasone (FLONASE) 50 MCG/ACT nasal spray, Place 2 sprays into both nostrils daily., Disp: 11.1 mL, Rfl: 0   guaiFENesin-dextromethorphan (ROBITUSSIN DM)  100-10 MG/5ML syrup, Take 10 mLs by mouth every 4 (four) hours as needed for cough., Disp: 118 mL, Rfl: 0   HYDROcodone-acetaminophen (NORCO/VICODIN) 5-325 MG tablet, Take 1-2 tablets by mouth every 4 (four) hours as needed for moderate pain., Disp: 30 tablet, Rfl: 0   levothyroxine (SYNTHROID) 125 MCG tablet, Take 125 mcg by mouth daily before breakfast., Disp: , Rfl:    lidocaine-prilocaine (EMLA) cream, Apply to affected area once (Patient not taking: Reported on 03/09/2023), Disp: 30 g, Rfl: 3   loratadine (CLARITIN) 10 MG tablet, Take 1 tablet (10 mg total) by mouth daily., Disp: 30 tablet, Rfl: 0   methocarbamol (ROBAXIN) 500 MG tablet, Take 1 tablet (500 mg total) by mouth every 8 (eight) hours as needed  for muscle spasms (abd pain)., Disp: 10 tablet, Rfl: 0   polyethylene glycol (MIRALAX / GLYCOLAX) 17 g packet, Take 17 g by mouth daily as needed for mild constipation., Disp: 14 each, Rfl: 0   potassium chloride SA (K-DUR,KLOR-CON) 20 MEQ tablet, Take 20 mEq by mouth daily., Disp: , Rfl:    prochlorperazine (COMPAZINE) 10 MG tablet, Take 1 tablet (10 mg total) by mouth every 6 (six) hours as needed for nausea or vomiting., Disp: 30 tablet, Rfl: 1  Allergies:  Allergies  Allergen Reactions   Prednisone Other (See Comments)    Climbs the walls    Past Medical History, Surgical history, Social history, and Family History were reviewed and updated.  Review of Systems: Review of Systems  Constitutional:  Positive for fatigue.  HENT:  Negative.    Eyes: Negative.   Respiratory: Negative.    Cardiovascular:  Positive for palpitations.  Gastrointestinal:  Positive for blood in stool and rectal pain.  Endocrine: Negative.   Genitourinary: Negative.    Musculoskeletal: Negative.   Skin: Negative.   Neurological:  Positive for light-headedness.  Hematological: Negative.   Psychiatric/Behavioral: Negative.      Physical Exam:  height is 5' (1.524 m) and weight is 149 lb (67.6 kg). Her oral temperature is 98 F (36.7 C). Her blood pressure is 156/44 (abnormal) and her pulse is 89. Her respiration is 18 and oxygen saturation is 96%.   Wt Readings from Last 3 Encounters:  03/19/23 149 lb (67.6 kg)  03/09/23 148 lb 1.9 oz (67.2 kg)  02/26/23 144 lb 3.2 oz (65.4 kg)    Physical Exam Vitals reviewed.  HENT:     Head: Normocephalic and atraumatic.  Eyes:     Pupils: Pupils are equal, round, and reactive to light.  Cardiovascular:     Rate and Rhythm: Normal rate and regular rhythm.     Heart sounds: Normal heart sounds.  Pulmonary:     Effort: Pulmonary effort is normal.     Breath sounds: Normal breath sounds.  Abdominal:     General: Bowel sounds are normal.     Palpations:  Abdomen is soft.  Genitourinary:    Comments: Rectal exam showed very small external hemorrhoids.  There is no mass in the rectal vault.  Her stool is black and heme positive. Musculoskeletal:        General: No tenderness or deformity. Normal range of motion.     Cervical back: Normal range of motion.  Lymphadenopathy:     Cervical: No cervical adenopathy.  Skin:    General: Skin is warm and dry.     Findings: No erythema or rash.  Neurological:     Mental Status: She is alert and oriented to  person, place, and time.  Psychiatric:        Behavior: Behavior normal.        Thought Content: Thought content normal.        Judgment: Judgment normal.     Lab Results  Component Value Date   WBC 7.0 03/19/2023   HGB 8.2 (L) 03/19/2023   HCT 26.0 (L) 03/19/2023   MCV 100.0 03/19/2023   PLT 247 03/19/2023     Chemistry      Component Value Date/Time   NA 134 (L) 03/19/2023 1516   K 4.0 03/19/2023 1516   CL 97 (L) 03/19/2023 1516   CO2 28 03/19/2023 1516   BUN 24 (H) 03/19/2023 1516   CREATININE 0.66 03/19/2023 1516      Component Value Date/Time   CALCIUM 9.5 03/19/2023 1516   ALKPHOS 64 03/19/2023 1516   AST 39 03/19/2023 1516   ALT 52 (H) 03/19/2023 1516   BILITOT 0.8 03/19/2023 1516      Impression and Plan: Ms. Quinter is a very charming 89 year old white female.  She has at least a locally advanced adenocarcinoma of the rectum.  This pulmonary nodule is in the right upper lobe.  It measures 15 mm.  Again, I would not imagine that she would be symptomatic from this.  At this point, we will have to get her admitted.  She is going to need to be seen by gastroenterology to see where this bleeding is coming from.  I know she has had a history of rectal cancer.  She, I think, should be responding to treatment, at least by her CEA level.  She is on Eliquis.  She needs to be off the Eliquis.  Thankfully she did not so I she had atrial fibrillation today.  I do not know if she  will need any CAT scans.  I think she is going to have to be transfused given her hemoglobin 8.2.  She, again, we will stop the Xeloda for right now.  I had a long talk with her.  I explained why I thought she needed to be admitted.  I thought t it would be a lot more quicker for [place] to evaluate her in the hospital and have doctors come see her and have procedures done in the hospital.  1 thing that we need to think about while she is in the hospital is a Port-A-Cath.  She just has very poor peripheral access.  Hopefully, she will not be in the hospital all that long.   Josph Macho, MD 7/8/20244:13 PM

## 2023-03-19 NOTE — Assessment & Plan Note (Signed)
Dr. Myna Hidalgo is aware that pt is being admitted

## 2023-03-19 NOTE — ED Triage Notes (Signed)
Pt endorses rectal bleeding that started yesterday. Dark blood in stool. Pt has colorectal cancer. Pt taken eliquis, endorses weakness. Denies any pain. Hgb went from 11 10 days ago to 8.2 today.

## 2023-03-19 NOTE — Assessment & Plan Note (Signed)
Allow permissive htn for tonight 

## 2023-03-19 NOTE — H&P (Signed)
[name]:[id number] DOB: Jan 24, 1984 DOA: 03/19/2023     PCP: Creola Corn, MD   Outpatient Specialists:    CARDS: Dr. Royann Shivers     Oncology  Dr Myna Hidalgo GI Dr.Pyrtle  ( [facility]) No care team member to display    Patient arrived to ER on 03/19/23 at 1646 Referred by Attending Arby Barrette, MD   Patient coming from:    home Lives With family    Chief Complaint:   Chief Complaint  Patient presents with   Rectal Bleeding    HPI: [name] is a 89 y.o. 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  There is no mass in the rectal vault.  Her stool is black and heme positive.  Hg was 8.2 down from 11 10 days aog She was sent to ER by her Oncologist   No CP no SOB had nausea no vomiting, no diarrhea     Denies significant ETOH intake   Does not smoke   No results found for: "SARSCOV2NAA"      Regarding pertinent Chronic problems:     Hyperlipidemia -  on statins Lipitor (atorvastatin)  Lipid Panel     Component Value Date/Time   CHOL 127 07/10/2017 0416   TRIG 119 07/10/2017 0416   HDL 44 07/10/2017 0416   CHOLHDL 2.9 07/10/2017 0416   VLDL 24 07/10/2017 0416   LDLCALC 59 07/10/2017 0416     HTN on NOrvasc    last echo  Recent Results (from the past 81191 hour(s))  ECHOCARDIOGRAM COMPLETE   Collection Time: 01/21/23 11:26 AM  Result Value   Weight 2,352   Height 60   BP 135/53   Single Plane A4C EF 73.3   S' Lateral 2.60   AR max vel 0.76   AV Area VTI 0.91   AV Mean grad 10.0    AV Peak grad 19.7   Ao pk vel 2.22   Area-P 1/2 3.46   MV M vel 5.95   AV Area mean vel 0.83   MV Peak grad 141.6   Est EF 60 - 65%   Narrative      ECHOCARDIOGRAM REPORT       IMPRESSIONS    1. Left ventricular ejection fraction, by estimation, is 60 to 65%. The left ventricle has normal function. The left ventricle has no regional wall motion abnormalities. Left ventricular diastolic parameters are indeterminate.  2. Right ventricular systolic function is normal. The right ventricular size is normal. There is normal pulmonary artery systolic pressure.  3. The mitral valve is grossly normal. Mild mitral valve regurgitation.  4. The aortic valve is calcified. Aortic valve regurgitation is not visualized. Mild aortic valve stenosis.  5. The inferior vena cava is normal in size with greater than 50% respiratory variability, suggesting right atrial pressure of 3 mmHg.            DM 2 -  Lab Results  Component Value Date   HGBA1C 6.6 (H) 01/17/2023   diet controlled   Hypothyroidism:  Lab Results  Component Value Date   TSH 5.220 (H) 03/09/2023   on synthroid   Asthma -well   controlled on home inhalers/ nebs                           A. Fib -  - CHA2DS2 vas score 8      current  on anticoagulation with  Eliquis,               - Rhythm control:   amiodarone     Chronic anemia - baseline hg Hemoglobin & Hematocrit  Recent Labs    03/09/23 1241 03/19/23 1516 03/19/23 2125  HGB 11.0* 8.2* 7.9*   Iron/TIBC/Ferritin/ %Sat    Component Value Date/Time   IRON 69 03/09/2023 1241   TIBC 391 03/09/2023 1241   FERRITIN 57 03/19/2023 1529   IRONPCTSAT 18 03/09/2023 1241      While in ER:    Repeat HG 7.9 Vitals are stable Sent msg to [facility]  Started on protonix drip    Lab Orders         CBC      Following Medications were ordered in ER: Medications  ALPRAZolam (XANAX) tablet 0.5 mg (0.5 mg Oral Given 03/19/23 2035)  pantoprazole (PROTONIX) injection 40 mg  (40 mg Intravenous Given 03/19/23 2054)     ED Triage Vitals  Enc Vitals Group     BP 03/19/23 1655 (!) 151/55     Pulse Rate 03/19/23 1655 85     Resp 03/19/23 1655 16     Temp 03/19/23 1655 97.9 F (36.6 C)     Temp Source 03/19/23 1655 Oral     SpO2 03/19/23 1655 93 %     Weight 03/19/23 1655 149 lb 9.6 oz (67.9 kg)     Height 03/19/23 1655 5' (1.524 m)     Head Circumference --      Peak Flow --      Pain Score 03/19/23 1703 0     Pain Loc --      Pain Edu? --      Excl. in GC? --   TMAX(24)@     _________________________________________ Significant initial  Findings: Abnormal Labs Reviewed  CBC - Abnormal; Notable for the following components:      Result Value   RBC 2.49 (*)    Hemoglobin 7.9 (*)    HCT 24.5 (*)    RDW 24.0 (*)    All other components within normal limits    Cardiac Panel (last 3 results) Recent Labs    03/19/23 2030  CKTOTAL 48     ECG: Ordered Personally reviewed and interpreted by me showing: HR : 81 Rhythm: Sinus rhythm Probable anteroseptal infarct, old QTC 462  BNP (last 3 results) Recent Labs    01/26/23 0515  BNP 87.1     COVID-19 Labs  Recent Labs    03/19/23 1529  FERRITIN 57    No results found for: "SARSCOV2NAA"   The recent clinical data is shown below. Vitals:   03/19/23 1655 03/19/23 1958  BP: (!) 151/55 (!) 152/62  Pulse: 85 84  Resp: 16 20  Temp: 97.9 F (36.6 C) 98.4 F (36.9 C)  TempSrc: Oral Oral  SpO2: 93% 95%  Weight: 67.9 kg   Height: 5' (1.524 m)     WBC     Component Value Date/Time   WBC 5.2 03/19/2023 2125   LYMPHSABS 2.3 03/19/2023 1516  MONOABS 0.8 03/19/2023 1516   EOSABS 0.1 03/19/2023 1516   BASOSABS 0.1 03/19/2023 1516      UA  ordered     Results for orders placed or performed during the hospital encounter of 10/05/08  Urine culture     Status: None   Collection Time: 10/05/08  6:06 PM   Specimen: Urine, Random  Result Value Ref Range Status   Specimen Description  URINE, RANDOM  Final   Special Requests NONE  Final   Colony Count 30,000 COLONIES/ML  Final   Culture   Final    Multiple bacterial morphotypes present, none predominant. Suggest appropriate recollection if clinically indicated.   Report Status 10/07/2008 FINAL  Final      __________________________________________________________ Recent Labs  Lab 03/19/23 1516 03/19/23 2030  NA 134*  --   K 4.0  --   CO2 28  --   GLUCOSE 165*  --   BUN 24*  --   CREATININE 0.66  --   CALCIUM 9.5  --   MG  --  2.2  PHOS  --  4.2    Cr  stable,    Lab Results  Component Value Date   CREATININE 0.66 03/19/2023   CREATININE 0.76 03/09/2023   CREATININE 0.81 02/23/2023    Recent Labs  Lab 03/19/23 1516  AST 39  ALT 52*  ALKPHOS 64  BILITOT 0.8  PROT 6.7  ALBUMIN 4.0   Lab Results  Component Value Date   CALCIUM 9.5 03/19/2023   PHOS 4.5 01/26/2023    Plt: Lab Results  Component Value Date   PLT 214 03/19/2023       Recent Labs  Lab 03/19/23 1516 03/19/23 2125  WBC 7.0 5.2  NEUTROABS 3.7  --   HGB 8.2* 7.9*  HCT 26.0* 24.5*  MCV 100.0 98.4  PLT 247 214    HG/HCT  Down  from baseline see below    Component Value Date/Time   HGB 7.9 (L) 03/19/2023 2125   HGB 8.2 (L) 03/19/2023 1516   HCT 24.5 (L) 03/19/2023 2125   MCV 98.4 03/19/2023 2125     _______________________________________________ Hospitalist was called for admission for   Gastrointestinal hemorrhage, symptomatic anemia   The following Work up has been ordered so far:  Orders Placed This Encounter  Procedures   CBC   ED Cardiac monitoring   Nursing communication   Consult to hospitalist   ED EKG   Type and screen [facility]     OTHER Significant initial  Findings:  labs showing:     DM  labs:  HbA1C: Recent Labs    01/17/23 2236  HGBA1C 6.6*       CBG (last 3)  No results for input(s): "GLUCAP" in the last 72 hours.        Cultures:    Component Value  Date/Time   SDES URINE, RANDOM 10/05/2008 1806   SPECREQUEST NONE 10/05/2008 1806   CULT  10/05/2008 1806    Multiple bacterial morphotypes present, none predominant. Suggest appropriate recollection if clinically indicated.   REPTSTATUS 10/07/2008 FINAL 10/05/2008 1806     Radiological Exams on Admission: No results found. _______________________________________________________________________________________________________ Latest  Blood pressure (!) 152/62, pulse 84, temperature 98.4 F (36.9 C), temperature source Oral, resp. rate 20, height 5' (1.524 m), weight 67.9 kg, SpO2 95 %.   Vitals  labs and radiology finding personally reviewed  Review of Systems:    Pertinent positives include:  , fatigue  melena, blood in stool,  Constitutional:  No weight loss, night sweats, Fevers, chills, weight loss  HEENT:  No headaches, Difficulty swallowing,Tooth/dental problems,Sore throat,  No sneezing, itching, ear ache, nasal congestion, post nasal drip,  Cardio-vascular:  No chest pain, Orthopnea, PND, anasarca, dizziness, palpitations.no Bilateral lower extremity swelling  GI:  No heartburn, indigestion, abdominal pain, nausea, vomiting, diarrhea, change in bowel habits, loss of appetite,hematemesis Resp:  no shortness of breath at rest. No dyspnea on exertion, No excess mucus, no productive cough, No non-productive cough, No coughing up of blood.No change in color of mucus.No wheezing. Skin:  no rash or lesions. No jaundice GU:  no dysuria, change in color of urine, no urgency or frequency. No straining to urinate.  No flank pain.  Musculoskeletal:  No joint pain or no joint swelling. No decreased range of motion. No back pain.  Psych:  No change in mood or affect. No depression or anxiety. No memory loss.  Neuro: no localizing neurological complaints, no tingling, no weakness, no double vision, no gait abnormality, no slurred speech, no confusion  All systems reviewed and apart  from HOPI all are negative _______________________________________________________________________________________________ Past Medical History:   Past Medical History:  Diagnosis Date   Anemia    Anxiety    Aortic stenosis 02/26/2023   TTE 01/21/23: EF 60-65, no RWMA, NL RVSF, NL PASP, RVSP 31.5, mild MR, mild AS, mean 10, Vmax 222 cm/s, DI 0.45, RAP 3   Colon polyps    DDD (degenerative disc disease), cervical    Diabetes mellitus    Diabetes mellitus, type 2 (HCC)    Hyperlipidemia    Hypertension    Hypothyroidism    Leukemia (HCC) in remission   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   Mucous retention cyst of maxillary sinus    Osteoporosis    Pinched nerve In neck   Plantar fasciitis    Stroke [facility])    Vitamin D deficiency       Past Surgical History:  Procedure Laterality Date   APPENDECTOMY     BIOPSY  01/19/2023   Procedure: BIOPSY;  Surgeon: Beverley Fiedler, MD;  Location: [facility] ENDOSCOPY;  Service: Gastroenterology;;   CHOLECYSTECTOMY     2004   COLONOSCOPY N/A 02/24/2013   Procedure: COLONOSCOPY;  Surgeon: Meryl Dare, MD;  Location: [facility] ENDOSCOPY;  Service: Endoscopy;  Laterality: N/A;   COLONOSCOPY W/ POLYPECTOMY     COLONOSCOPY WITH PROPOFOL N/A 12/03/2017   Procedure: COLONOSCOPY WITH PROPOFOL;  Surgeon: Rachael Fee, MD;  Location: [facility] ENDOSCOPY;  Service: Gastroenterology;  Laterality: N/A;   ESOPHAGOGASTRODUODENOSCOPY     ESOPHAGOGASTRODUODENOSCOPY N/A 02/24/2013   Procedure: ESOPHAGOGASTRODUODENOSCOPY (EGD);  Surgeon: Meryl Dare, MD;  Location: [facility] ENDOSCOPY;  Service: Endoscopy;  Laterality: N/A;   ESOPHAGOGASTRODUODENOSCOPY (EGD) WITH PROPOFOL N/A 12/03/2017   Procedure: ESOPHAGOGASTRODUODENOSCOPY (EGD) WITH PROPOFOL;  Surgeon: Rachael Fee, MD;  Location: [facility] ENDOSCOPY;  Service: Gastroenterology;  Laterality: N/A;   EYE SURGERY     bilateral catracts   FLEXIBLE SIGMOIDOSCOPY N/A 01/19/2023   Procedure: FLEXIBLE SIGMOIDOSCOPY;  Surgeon: Beverley Fiedler, MD;   Location: [facility] ENDOSCOPY;  Service: Gastroenterology;  Laterality: N/A;    Social History:  Ambulatory   independently      reports that she has quit smoking. Her smoking use included cigarettes. She smoked an average of .5 packs per day. She has never used smokeless tobacco. She reports that she does not drink alcohol and does not use drugs.     Family  History:   Family History  Problem Relation Age of Onset   Diabetes Other    Colon cancer Son    Esophageal cancer Neg Hx    Stomach cancer Neg Hx    ______________________________________________________________________________________________ Allergies: Allergies  Allergen Reactions   Prednisone Other (See Comments)    Climbs the walls     Prior to Admission medications   Medication Sig Start Date End Date Taking? Authorizing Provider  acetaminophen (TYLENOL) 325 MG tablet Take 2 tablets (650 mg total) by mouth every 6 (six) hours as needed for mild pain (or Fever >/= 101). 01/28/23   Azucena Fallen, MD  ALPRAZolam Prudy Feeler) 0.5 MG tablet Take 0.5 mg by mouth 3 (three) times daily as needed for anxiety. For anxiety    [provider]  amiodarone (PACERONE) 200 MG tablet Take 1 tablet (200 mg total) by mouth daily. DO NOT START THIS MEDICATION UNTIL PREVIOUS DOSE (400mg ) HAS BEEN COMPLETED Patient taking differently: Take 200 mg by mouth daily. DO NOT START THIS MEDICATION UNTIL PREVIOUS DOSE (400mg ) HAS BEEN COMPLETED. Takes in AM 02/05/23   Azucena Fallen, MD  amLODipine (NORVASC) 5 MG tablet Take 1.5 tablets (7.5 mg total) by mouth daily. 02/26/23 05/27/23  Tereso Newcomer T, PA-C  apixaban (ELIQUIS) 5 MG TABS tablet Take 1 tablet (5 mg total) by mouth 2 (two) times daily. 02/27/23   Tereso Newcomer T, PA-C  atorvastatin (LIPITOR) 20 MG tablet Take 20 mg by mouth daily.    [provider]  capecitabine (XELODA) 500 MG tablet Take 3 tablets (1,500 mg total) by mouth 2 (two) times daily after a meal. Take  within 30 minutes after meals. Take for 14 days on, 7 days off. Repeat every 21 days. 02/09/23   Josph Macho, MD  cholecalciferol (VITAMIN D3) 25 MCG (1000 UNIT) tablet Take 1,000 Units by mouth daily.    [provider]  cyanocobalamin 1000 MCG tablet Take 1 tablet (1,000 mcg total) by mouth daily. 01/28/23   Azucena Fallen, MD  Escitalopram Oxalate (LEXAPRO PO) Take 1 tablet by mouth daily. 5 mg    [provider]  famotidine (PEPCID) 20 MG tablet Take 20 mg by mouth daily.    [provider]  feeding supplement (ENSURE ENLIVE / ENSURE PLUS) LIQD Take 237 mLs by mouth 3 (three) times daily with meals. 01/28/23   Azucena Fallen, MD  fluticasone (FLONASE) 50 MCG/ACT nasal spray Place 2 sprays into both nostrils daily. 01/28/23   Azucena Fallen, MD  guaiFENesin-dextromethorphan (ROBITUSSIN DM) 100-10 MG/5ML syrup Take 10 mLs by mouth every 4 (four) hours as needed for cough. 01/28/23   Azucena Fallen, MD  HYDROcodone-acetaminophen (NORCO/VICODIN) 5-325 MG tablet Take 1-2 tablets by mouth every 4 (four) hours as needed for moderate pain. 01/28/23   Azucena Fallen, MD  levothyroxine (SYNTHROID) 125 MCG tablet Take 125 mcg by mouth daily before breakfast.    [provider]  lidocaine-prilocaine (EMLA) cream Apply to affected area once Patient not taking: Reported on 03/09/2023 02/08/23   Josph Macho, MD  loratadine (CLARITIN) 10 MG tablet Take 1 tablet (10 mg total) by mouth daily. 01/28/23   Azucena Fallen, MD  methocarbamol (ROBAXIN) 500 MG tablet Take 1 tablet (500 mg total) by mouth every 8 (eight) hours as needed for muscle spasms (abd pain). 01/28/23   Azucena Fallen, MD  polyethylene glycol (MIRALAX / GLYCOLAX) 17 g packet Take 17 g by mouth daily as  needed for mild constipation. 01/28/23   Azucena Fallen, MD  potassium chloride SA (K-DUR,KLOR-CON) 20 MEQ tablet Take 20 mEq by mouth daily.    [provider]  prochlorperazine (COMPAZINE) 10 MG tablet Take 1 tablet (10 mg total) by mouth every 6 (six) hours as needed for nausea or vomiting. 02/08/23   Josph Macho, MD    ___________________________________________________________________________________________________ Physical Exam:    03/19/2023    7:58 PM 03/19/2023    4:55 PM 03/19/2023    3:34 PM  Vitals with BMI  Height  5\' 0"  5\' 0"   Weight  149 lbs 10 oz 149 lbs  BMI  29.22 29.1  Systolic 152 151 962  Diastolic 62 55 44  Pulse 84 85 89     1. General:  in No  Acute distress   Chronically ill   -appearing 2. Psychological: Alert and   Oriented 3. Head/ENT:    Dry Mucous Membranes                          Head Non traumatic, neck supple                         Poor Dentition 4. SKIN:  decreased Skin turgor,  Skin clean Dry and intact no rash    5. Heart: Regular rate and rhythm no  Murmur, no Rub or gallop 6. Lungs: some wheezing  wheezes or crackles   7. Abdomen: Soft,  non-tender, Non distended   obese  bowel sounds present 8. Lower extremities: no clubbing, cyanosis, no  edema 9. Neurologically Grossly intact, moving all 4 extremities equally   10. MSK: Normal range of motion    Chart has been reviewed  ______________________________________________________________________________________________  Assessment/Plan  89 y.o. female with medical history significant of rectal CA, a,fib on eliquis, HTN  Tachybrady syndrome, Aortic stenosis mets to the lung    Admitted for   Gastrointestinal hemorrhage    Present on Admission:  Upper GI bleed  Symptomatic anemia  Hyperlipidemia  HTN (hypertension)  Hypothyroidism  Rectal cancer (HCC)  Paroxysmal atrial fibrillation (HCC)  Wheezing     Upper GI bleed  - Glasgow Blatchford score BUN >18.2  Hg  <67F   Admit to progressive pt on anticoagulation     - sent msg to gastroenterology (  [facility]) they will see patient in a.m. appreciate their consult   - serial CBC.     - Monitor for any recurrence,  evidence of hemodynamic instability or significant blood loss  - Transfuse as needed for hemoglobin below 7 or evidence of life-threatening bleeding  - Establish at least 2 PIV and fluid resuscitate   - clear liquids for tonight keep nothing by mouth post midnight,   -  administer Protonix  drip  Given hx of rectal cancer and some BRBPR will obtain CTA abd/pelvis gi bleed     Symptomatic anemia Transfuse 1 unit obtaine serial cbc  Hyperlipidemia Continue lipitor 21m gpo q day  Diabetes mellitus without complication (HCC)  - Order Sensitive SSI    -  check TSH and HgA1C  - Hold by mouth medications    HTN (hypertension) Allow permissive htn for tonight    Hypothyroidism - Check TSH continue home medications Synthroid at 125 mcg po q day   Rectal cancer (HCC) Dr. Myna Hidalgo is aware that pt is being admitted  Paroxysmal atrial fibrillation (HCC) Hold eliquis  Restart amiodarone  Wheezing Obtain CXR prior hx of smoking  Hx of spiculated mass  Will eval fro any bronchiolar obstruction  albuterol PRn     Other plan as per orders.  DVT prophylaxis:  SCD      Code Status:    Code Status: Prior FULL CODE  as per patient   I had personally discussed CODE STATUS with patient   ACP none   Family Communication:   Family   at  Bedside  plan of care was discussed   with   Daughter,   Diet npo   Disposition Plan:       To home once workup is complete and patient is stable   Following barriers for discharge:                            Electrolytes corrected                               Anemia corrected                                                        Will need consultants to evaluate patient prior to discharge      Consult Orders  (From admission, onward)           Start     Ordered   03/19/23 2151  Consult to hospitalist  Once       Provider:  (Not yet assigned)  Question Answer Comment  Place call to: [facility]    Reason for Consult Admit      03/19/23 2150                    Consults called: [facility] Sent msg to oncology to let ehm know pt is here   Admission status:  ED Disposition     ED Disposition  Admit   Condition  --   Comment  Hospital Area: [facility] [[id number]]  Level of Care: Progressive [102]  Admit to Progressive based on following criteria: GI, ENDOCRINE disease patients with GI bleeding, acute liver failure or pancreatitis, stable with diabetic ketoacidosis or thyrotoxicosis (hypothyroid) state.  May admit patient to [facility] or [facility] if equivalent level of care is available:: No  Covid Evaluation: Asymptomatic - no recent exposure (last 10 days) testing not required  Diagnosis: Upper GI bleed [161096]  Admitting Physician: Therisa Doyne [[id number]]  Attending Physician: Therisa Doyne [[id number]]  Certification:: I certify this patient will need inpatient services for at least 2 midnights  Estimated Length of Stay: 2           inpatient     I Expect 2 midnight stay secondary to severity of patient's current illness need for inpatient interventions justified by the following:     Severe lab/radiological/exam abnormalities including:    Gi bleed and extensive comorbidities including:  DM2   COPD/asthma    malignancy,    That are currently affecting medical management.   I expect  patient to be hospitalized for 2 midnights requiring inpatient medical care.  Patient is at high risk for adverse outcome (such as loss of life or disability) if not treated.  Indication  for inpatient stay as follows:  Need for operative/procedural  intervention     Need for IV antibiotics, IV  PPI    Level of care         progressive tele indefinitely please discontinue once patient no longer qualifies COVID-19 Labs     Plumer Mittelstaedt 03/20/2023, 1:26 AM    [facility]     after 2 AM please page floor coverage PA If 7AM-7PM, please  contact the day team taking care of the patient using [email or web address]

## 2023-03-20 ENCOUNTER — Other Ambulatory Visit: Payer: Self-pay

## 2023-03-20 ENCOUNTER — Encounter: Payer: Self-pay | Admitting: Cardiovascular Disease

## 2023-03-20 DIAGNOSIS — D62 Acute posthemorrhagic anemia: Secondary | ICD-10-CM | POA: Diagnosis not present

## 2023-03-20 DIAGNOSIS — K921 Melena: Secondary | ICD-10-CM | POA: Diagnosis not present

## 2023-03-20 DIAGNOSIS — Z7901 Long term (current) use of anticoagulants: Secondary | ICD-10-CM

## 2023-03-20 DIAGNOSIS — I4891 Unspecified atrial fibrillation: Secondary | ICD-10-CM | POA: Diagnosis not present

## 2023-03-20 DIAGNOSIS — C20 Malignant neoplasm of rectum: Secondary | ICD-10-CM | POA: Diagnosis not present

## 2023-03-20 DIAGNOSIS — K922 Gastrointestinal hemorrhage, unspecified: Secondary | ICD-10-CM | POA: Diagnosis not present

## 2023-03-20 LAB — COMPREHENSIVE METABOLIC PANEL
ALT: 50 U/L — ABNORMAL HIGH (ref 0–44)
AST: 36 U/L (ref 15–41)
Albumin: 3.1 g/dL — ABNORMAL LOW (ref 3.5–5.0)
Alkaline Phosphatase: 47 U/L (ref 38–126)
Anion gap: 9 (ref 5–15)
BUN: 19 mg/dL (ref 8–23)
CO2: 26 mmol/L (ref 22–32)
Calcium: 8.4 mg/dL — ABNORMAL LOW (ref 8.9–10.3)
Chloride: 98 mmol/L (ref 98–111)
Creatinine, Ser: 0.62 mg/dL (ref 0.44–1.00)
GFR, Estimated: >60 mL/min (ref >60)
Glucose, Bld: 116 mg/dL — ABNORMAL HIGH (ref 70–99)
Potassium: 3.7 mmol/L (ref 3.5–5.1)
Sodium: 133 mmol/L — ABNORMAL LOW (ref 135–145)
Total Bilirubin: 0.9 mg/dL (ref 0.3–1.2)
Total Protein: 5.6 g/dL — ABNORMAL LOW (ref 6.5–8.1)

## 2023-03-20 LAB — CBC
HCT: 26.6 % — ABNORMAL LOW (ref 36.0–46.0)
HCT: 27.4 % — ABNORMAL LOW (ref 36.0–46.0)
HCT: 31.1 % — ABNORMAL LOW (ref 36.0–46.0)
Hemoglobin: 8.7 g/dL — ABNORMAL LOW (ref 12.0–15.0)
Hemoglobin: 9.2 g/dL — ABNORMAL LOW (ref 12.0–15.0)
Hemoglobin: 10 g/dL — ABNORMAL LOW (ref 12.0–15.0)
MCH: 31.6 pg (ref 26.0–34.0)
MCH: 32.7 pg (ref 26.0–34.0)
MCH: 32.2 pg (ref 26.0–34.0)
MCHC: 32.7 g/dL (ref 30.0–36.0)
MCHC: 33.6 g/dL (ref 30.0–36.0)
MCHC: 32.2 g/dL (ref 30.0–36.0)
MCV: 96.7 fL (ref 80.0–100.0)
MCV: 97.5 fL (ref 80.0–100.0)
MCV: 100 fL (ref 80.0–100.0)
Platelets: 201 10*3/uL (ref 150–400)
Platelets: 214 10*3/uL (ref 150–400)
Platelets: 214 10*3/uL (ref 150–400)
RBC: 2.75 MIL/uL — ABNORMAL LOW (ref 3.87–5.11)
RBC: 2.81 MIL/uL — ABNORMAL LOW (ref 3.87–5.11)
RBC: 3.11 MIL/uL — ABNORMAL LOW (ref 3.87–5.11)
RDW: 22.4 % — ABNORMAL HIGH (ref 11.5–15.5)
RDW: 23 % — ABNORMAL HIGH (ref 11.5–15.5)
RDW: 23.8 % — ABNORMAL HIGH (ref 11.5–15.5)
WBC: 5 10*3/uL (ref 4.0–10.5)
WBC: 6.2 10*3/uL (ref 4.0–10.5)
WBC: 6.8 10*3/uL (ref 4.0–10.5)
nRBC: 0 % (ref 0.0–0.2)
nRBC: 0 % (ref 0.0–0.2)
nRBC: 0 % (ref 0.0–0.2)

## 2023-03-20 LAB — IRON AND IRON BINDING CAPACITY (CC-WL,HP ONLY)
Iron: 66 ug/dL (ref 28–170)
Saturation Ratios: 17 % (ref 10.4–31.8)
TIBC: 389 ug/dL (ref 250–450)
UIBC: 323 ug/dL (ref 148–442)

## 2023-03-20 LAB — PREPARE RBC (CROSSMATCH)

## 2023-03-20 LAB — TYPE AND SCREEN: Unit division: 0

## 2023-03-20 LAB — BPAM RBC
Blood Product Expiration Date: 202408022359
Unit Type and Rh: 5100

## 2023-03-20 LAB — CBG MONITORING, ED
Glucose-Capillary: 107 mg/dL — ABNORMAL HIGH (ref 70–99)
Glucose-Capillary: 113 mg/dL — ABNORMAL HIGH (ref 70–99)
Glucose-Capillary: 141 mg/dL — ABNORMAL HIGH (ref 70–99)
Glucose-Capillary: 143 mg/dL — ABNORMAL HIGH (ref 70–99)

## 2023-03-20 LAB — GLUCOSE, CAPILLARY
Glucose-Capillary: 124 mg/dL — ABNORMAL HIGH (ref 70–99)
Glucose-Capillary: 155 mg/dL — ABNORMAL HIGH (ref 70–99)

## 2023-03-20 LAB — TSH: TSH: 5.468 u[IU]/mL — ABNORMAL HIGH (ref 0.350–4.500)

## 2023-03-20 LAB — PREALBUMIN: Prealbumin: 17 mg/dL — ABNORMAL LOW (ref 18–38)

## 2023-03-20 MED ORDER — ESCITALOPRAM OXALATE 10 MG PO TABS
5.0000 mg | ORAL_TABLET | Freq: Every day | ORAL | Status: DC
  Administered 2023-03-20 – 2023-03-29 (×10): 5 mg via ORAL
  Filled 2023-03-20 (×10): qty 1

## 2023-03-20 MED ORDER — METOCLOPRAMIDE HCL 5 MG/ML IJ SOLN
5.0000 mg | Freq: Four times a day (QID) | INTRAMUSCULAR | Status: DC | PRN
  Administered 2023-03-24 – 2023-03-27 (×3): 5 mg via INTRAVENOUS
  Filled 2023-03-20 (×4): qty 2

## 2023-03-20 MED ORDER — AMIODARONE HCL 200 MG PO TABS
200.0000 mg | ORAL_TABLET | Freq: Every day | ORAL | Status: DC
  Administered 2023-03-20 – 2023-03-30 (×11): 200 mg via ORAL
  Filled 2023-03-20 (×11): qty 1

## 2023-03-20 MED ORDER — HYDROCODONE-ACETAMINOPHEN 5-325 MG PO TABS
1.0000 | ORAL_TABLET | ORAL | Status: DC | PRN
  Administered 2023-03-20: 2 via ORAL
  Administered 2023-03-20 (×2): 1 via ORAL
  Administered 2023-03-20 – 2023-03-23 (×5): 2 via ORAL
  Administered 2023-03-24: 1 via ORAL
  Filled 2023-03-20: qty 2
  Filled 2023-03-20: qty 1
  Filled 2023-03-20 (×4): qty 2
  Filled 2023-03-20: qty 1
  Filled 2023-03-20 (×2): qty 2
  Filled 2023-03-20: qty 1

## 2023-03-20 MED ORDER — ALPRAZOLAM 0.5 MG PO TABS
0.5000 mg | ORAL_TABLET | Freq: Three times a day (TID) | ORAL | Status: DC | PRN
  Administered 2023-03-20 – 2023-03-29 (×15): 0.5 mg via ORAL
  Filled 2023-03-20 (×15): qty 1

## 2023-03-20 MED ORDER — SODIUM CHLORIDE 0.9 % IV SOLN: INTRAVENOUS | Status: AC

## 2023-03-20 MED ORDER — LEVOTHYROXINE SODIUM 25 MCG PO TABS
125.0000 ug | ORAL_TABLET | Freq: Every day | ORAL | Status: DC
  Administered 2023-03-20 – 2023-03-30 (×11): 125 ug via ORAL
  Filled 2023-03-20 (×9): qty 1

## 2023-03-20 MED ORDER — LORATADINE 10 MG PO TABS
10.0000 mg | ORAL_TABLET | Freq: Every day | ORAL | Status: DC
  Administered 2023-03-20 – 2023-03-30 (×11): 10 mg via ORAL
  Filled 2023-03-20 (×11): qty 1

## 2023-03-20 MED ORDER — SODIUM CHLORIDE 0.9 % IV SOLN: INTRAVENOUS | Status: DC

## 2023-03-20 MED ORDER — LEVOTHYROXINE SODIUM 25 MCG PO TABS: 125.0000 ug | ORAL_TABLET | Freq: Every day | ORAL | Status: DC

## 2023-03-20 MED ORDER — ATORVASTATIN CALCIUM 20 MG PO TABS
20.0000 mg | ORAL_TABLET | Freq: Every day | ORAL | Status: DC
  Administered 2023-03-20 – 2023-03-30 (×11): 20 mg via ORAL
  Filled 2023-03-20 (×5): qty 1
  Filled 2023-03-20: qty 2
  Filled 2023-03-20 (×5): qty 1

## 2023-03-20 NOTE — Consult Note (Addendum)
Consultation  Referring Provider: Dr. Sunnie Nielsen   Primary Care Physician:  Creola Corn, MD Primary Gastroenterologist: Dr. Rhea Belton        Reason for Consultation: Melena in the setting of chronic anticoagulation with Eliquis, also history of rectal cancer currently on chemo            HPI:   [name] is a 89 y.o. 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  Associated symptoms include tiredness with rectal pain as well as lightheadedness.  Recently seen by oncology team with a rectal exam showing very small external hemorrhoids, no mass in the rectal vault but stool was black and heme positive.      Today, patient is seen with her daughter by her bedside who assists with her history.  Apparently she started with black stools about 2 to 3 days ago and has had 3 to 4 days since then with the last yesterday morning 03/19/2023.  She is on Eliquis with her last dose Monday morning 03/19/2023.  She did start with nausea over the past 2 to 3 days, helped by what sounds like Zofran given to her by Dr. Myna Hidalgo.  She denies any abdominal pain.  Tells me that she does have some rectal pain when she lays on her back too long in the setting of her known rectal cancer.  Denies palpitations or shortness of breath.    Denies fever, chills or vomiting.  ER course: Hemoglobin 11 (10 days ago)--> 8.2  GI history: 01/19/2023 flex sigmoidoscopy with rectal mass 2.0 cm from anal verge, malignant partially obstructing tumor in the mid rectum and the distal rectum, diverticulosis in the sigmoid  colon 01/15/2023 office visit with Willette Cluster for rectal bleeding 12/03/2017 EGD for IDA with 1 small duodenal bulb AVM, small hiatal hernia  Past Medical History:  Diagnosis Date   Anemia    Anxiety    Aortic stenosis 02/26/2023   TTE 01/21/23: EF 60-65, no RWMA, NL RVSF, NL PASP, RVSP 31.5, mild MR, mild AS, mean 10, Vmax 222 cm/s, DI 0.45, RAP 3   Colon polyps    DDD (degenerative disc disease), cervical    Diabetes mellitus    Diabetes mellitus, type 2 (HCC)    Hyperlipidemia    Hypertension    Hypothyroidism    Leukemia (HCC) in remission   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   Mucous retention cyst of maxillary sinus    Osteoporosis    Pinched nerve In neck   Plantar fasciitis    Stroke [facility])    Vitamin D deficiency     Past Surgical History:  Procedure Laterality Date   APPENDECTOMY     BIOPSY  01/19/2023   Procedure: BIOPSY;  Surgeon: Beverley Fiedler, MD;  Location: [facility];  Service: Gastroenterology;;   CHOLECYSTECTOMY     2004   COLONOSCOPY N/A 02/24/2013   Procedure: COLONOSCOPY;  Surgeon: Meryl Dare, MD;  Location: [facility];  Service: Endoscopy;  Laterality: N/A;   COLONOSCOPY W/ POLYPECTOMY     COLONOSCOPY WITH PROPOFOL N/A 12/03/2017   Procedure: COLONOSCOPY WITH PROPOFOL;  Surgeon: Rachael Fee,  MD;  Location: [facility];  Service: Gastroenterology;  Laterality: N/A;   ESOPHAGOGASTRODUODENOSCOPY     ESOPHAGOGASTRODUODENOSCOPY N/A 02/24/2013   Procedure: ESOPHAGOGASTRODUODENOSCOPY (EGD);  Surgeon: Meryl Dare, MD;  Location: [facility];  Service: Endoscopy;  Laterality: N/A;   ESOPHAGOGASTRODUODENOSCOPY (EGD) WITH PROPOFOL N/A 12/03/2017   Procedure: ESOPHAGOGASTRODUODENOSCOPY (EGD) WITH PROPOFOL;  Surgeon: Rachael Fee, MD;  Location: [facility];  Service: Gastroenterology;  Laterality: N/A;   EYE SURGERY     bilateral catracts   FLEXIBLE SIGMOIDOSCOPY N/A 01/19/2023   Procedure: FLEXIBLE SIGMOIDOSCOPY;  Surgeon: Beverley Fiedler, MD;   Location: [facility];  Service: Gastroenterology;  Laterality: N/A;    Family History  Problem Relation Age of Onset   Diabetes Other    Colon cancer Son    Esophageal cancer Neg Hx    Stomach cancer Neg Hx     Social History   Tobacco Use   Smoking status: Former    Packs/day: .5    Types: Cigarettes   Smokeless tobacco: Never   Tobacco comments:    Quit about 30 years ago  Vaping Use   Vaping Use: Never used  Substance Use Topics   Alcohol use: No   Drug use: No    Prior to Admission medications   Medication Sig Start Date End Date Taking? Authorizing Provider  acetaminophen (TYLENOL) 325 MG tablet Take 2 tablets (650 mg total) by mouth every 6 (six) hours as needed for mild pain (or Fever >/= 101). 01/28/23  Yes Azucena Fallen, MD  ALPRAZolam Prudy Feeler) 0.5 MG tablet Take 0.5 mg by mouth 3 (three) times daily as needed for anxiety. For anxiety   Yes [provider]  amiodarone (PACERONE) 200 MG tablet Take 1 tablet (200 mg total) by mouth daily. DO NOT START THIS MEDICATION UNTIL PREVIOUS DOSE (400mg ) HAS BEEN COMPLETED Patient taking differently: Take 200 mg by mouth daily. 02/05/23  Yes Azucena Fallen, MD  amLODipine (NORVASC) 5 MG tablet Take 1.5 tablets (7.5 mg total) by mouth daily. 02/26/23 05/27/23 Yes Weaver, Scott T, PA-C  apixaban (ELIQUIS) 5 MG TABS tablet Take 1 tablet (5 mg total) by mouth 2 (two) times daily. 02/27/23  Yes Weaver, Scott T, PA-C  atorvastatin (LIPITOR) 20 MG tablet Take 20 mg by mouth daily.   Yes [provider]  capecitabine (XELODA) 500 MG tablet Take 3 tablets (1,500 mg total) by mouth 2 (two) times daily after a meal. Take within 30 minutes after meals. Take for 14 days on, 7 days off. Repeat every 21 days. 02/09/23  Yes Josph Macho, MD  cholecalciferol (VITAMIN D3) 25 MCG (1000 UNIT) tablet Take 1,000 Units by mouth daily.   Yes [provider]  cyanocobalamin 1000 MCG tablet Take 1 tablet (1,000 mcg  total) by mouth daily. 01/28/23  Yes Azucena Fallen, MD  escitalopram (LEXAPRO) 5 MG tablet Take 5 mg by mouth at bedtime.   Yes [provider]  famotidine (PEPCID) 20 MG tablet Take 20 mg by mouth daily.   Yes [provider]  feeding supplement (ENSURE ENLIVE / ENSURE PLUS) LIQD Take 237 mLs by mouth 3 (three) times daily with meals. Patient taking differently: Take 237 mLs by mouth 3 (three) times daily as needed (nutrition). 01/28/23  Yes Azucena Fallen, MD  fluticasone [facility]) 50 MCG/ACT nasal spray Place 2 sprays into both nostrils daily. Patient taking differently: Place 2 sprays into both nostrils daily as needed for allergies. 01/28/23  Yes Carma Leaven  C, MD  guaiFENesin-dextromethorphan (ROBITUSSIN DM) 100-10 MG/5ML syrup Take 10 mLs by mouth every 4 (four) hours as needed for cough. 01/28/23  Yes Azucena Fallen, MD  HYDROcodone-acetaminophen (NORCO/VICODIN) 5-325 MG tablet Take 1-2 tablets by mouth every 4 (four) hours as needed for moderate pain. 01/28/23  Yes Azucena Fallen, MD  levothyroxine (SYNTHROID) 125 MCG tablet Take 125 mcg by mouth daily before breakfast.   Yes [provider]  loratadine (CLARITIN) 10 MG tablet Take 1 tablet (10 mg total) by mouth daily. 01/28/23  Yes Azucena Fallen, MD  methocarbamol (ROBAXIN) 500 MG tablet Take 1 tablet (500 mg total) by mouth every 8 (eight) hours as needed for muscle spasms (abd pain). 01/28/23  Yes Azucena Fallen, MD  polyethylene glycol (MIRALAX / GLYCOLAX) 17 g packet Take 17 g by mouth daily as needed for mild constipation. Patient taking differently: Take 17 g by mouth at bedtime. 01/28/23  Yes Azucena Fallen, MD  potassium chloride SA (K-DUR,KLOR-CON) 20 MEQ tablet Take 20 mEq by mouth daily.   Yes [provider]  prochlorperazine (COMPAZINE) 10 MG tablet Take 1 tablet (10 mg total) by mouth every 6 (six) hours as needed for nausea or vomiting. 02/08/23   Yes Ennever, Rose Phi, MD  lidocaine-prilocaine (EMLA) cream Apply to affected area once 02/08/23   Josph Macho, MD    Current Facility-Administered Medications  Medication Dose Route Frequency Provider Last Rate Last Admin   0.9 %  sodium chloride infusion   Intravenous Continuous Therisa Doyne, MD 75 mL/hr at 03/20/23 0527 New Bag at 03/20/23 0527   albuterol (PROVENTIL) (2.5 MG/3ML) 0.083% nebulizer solution 2.5 mg  2.5 mg Nebulization Q4H PRN Therisa Doyne, MD       ALPRAZolam Prudy Feeler) tablet 0.5 mg  0.5 mg Oral TID PRN Therisa Doyne, MD       amiodarone (PACERONE) tablet 200 mg  200 mg Oral Daily Doutova, Anastassia, MD       atorvastatin (LIPITOR) tablet 20 mg  20 mg Oral Daily Doutova, Anastassia, MD       escitalopram (LEXAPRO) tablet 5 mg  5 mg Oral QHS Doutova, Anastassia, MD       HYDROcodone-acetaminophen (NORCO/VICODIN) 5-325 MG per tablet 1-2 tablet  1-2 tablet Oral Q4H PRN Therisa Doyne, MD   1 tablet at 03/20/23 0333   insulin aspart (novoLOG) injection 0-9 Units  0-9 Units Subcutaneous Q4H Doutova, Jonny Ruiz, MD   1 Units at 03/20/23 0143   levothyroxine (SYNTHROID) tablet 125 mcg  125 mcg Oral Q0600 Therisa Doyne, MD   125 mcg at 03/20/23 0527   loratadine (CLARITIN) tablet 10 mg  10 mg Oral Daily Doutova, Anastassia, MD       metoCLOPramide (REGLAN) injection 5 mg  5 mg Intravenous Q6H PRN Therisa Doyne, MD       [START ON 03/23/2023] pantoprazole (PROTONIX) injection 40 mg  40 mg Intravenous Q12H Doutova, Anastassia, MD       pantoprozole (PROTONIX) 80 mg /NS 100 mL infusion  8 mg/hr Intravenous Continuous Doutova, Anastassia, MD 10 mL/hr at 03/20/23 0145 8 mg/hr at 03/20/23 0145   Current Outpatient Medications  Medication Sig Dispense Refill   acetaminophen (TYLENOL) 325 MG tablet Take 2 tablets (650 mg total) by mouth every 6 (six) hours as needed for mild pain (or Fever >/= 101). 30 tablet 0   ALPRAZolam (XANAX) 0.5 MG tablet Take  0.5 mg by mouth 3 (three) times daily as needed for anxiety. For  anxiety     amiodarone (PACERONE) 200 MG tablet Take 1 tablet (200 mg total) by mouth daily. DO NOT START THIS MEDICATION UNTIL PREVIOUS DOSE (400mg ) HAS BEEN COMPLETED (Patient taking differently: Take 200 mg by mouth daily.) 30 tablet 0   amLODipine (NORVASC) 5 MG tablet Take 1.5 tablets (7.5 mg total) by mouth daily. 135 tablet 3   apixaban (ELIQUIS) 5 MG TABS tablet Take 1 tablet (5 mg total) by mouth 2 (two) times daily. 180 tablet 1   atorvastatin (LIPITOR) 20 MG tablet Take 20 mg by mouth daily.     capecitabine (XELODA) 500 MG tablet Take 3 tablets (1,500 mg total) by mouth 2 (two) times daily after a meal. Take within 30 minutes after meals. Take for 14 days on, 7 days off. Repeat every 21 days. 84 tablet 5   cholecalciferol (VITAMIN D3) 25 MCG (1000 UNIT) tablet Take 1,000 Units by mouth daily.     cyanocobalamin 1000 MCG tablet Take 1 tablet (1,000 mcg total) by mouth daily. 30 tablet 0   escitalopram (LEXAPRO) 5 MG tablet Take 5 mg by mouth at bedtime.     famotidine (PEPCID) 20 MG tablet Take 20 mg by mouth daily.     feeding supplement (ENSURE ENLIVE / ENSURE PLUS) LIQD Take 237 mLs by mouth 3 (three) times daily with meals. (Patient taking differently: Take 237 mLs by mouth 3 (three) times daily as needed (nutrition).) 237 mL 12   fluticasone (FLONASE) 50 MCG/ACT nasal spray Place 2 sprays into both nostrils daily. (Patient taking differently: Place 2 sprays into both nostrils daily as needed for allergies.) 11.1 mL 0   guaiFENesin-dextromethorphan (ROBITUSSIN DM) 100-10 MG/5ML syrup Take 10 mLs by mouth every 4 (four) hours as needed for cough. 118 mL 0   HYDROcodone-acetaminophen (NORCO/VICODIN) 5-325 MG tablet Take 1-2 tablets by mouth every 4 (four) hours as needed for moderate pain. 30 tablet 0   levothyroxine (SYNTHROID) 125 MCG tablet Take 125 mcg by mouth daily before breakfast.     loratadine (CLARITIN) 10 MG  tablet Take 1 tablet (10 mg total) by mouth daily. 30 tablet 0   methocarbamol (ROBAXIN) 500 MG tablet Take 1 tablet (500 mg total) by mouth every 8 (eight) hours as needed for muscle spasms (abd pain). 10 tablet 0   polyethylene glycol (MIRALAX / GLYCOLAX) 17 g packet Take 17 g by mouth daily as needed for mild constipation. (Patient taking differently: Take 17 g by mouth at bedtime.) 14 each 0   potassium chloride SA (K-DUR,KLOR-CON) 20 MEQ tablet Take 20 mEq by mouth daily.     prochlorperazine (COMPAZINE) 10 MG tablet Take 1 tablet (10 mg total) by mouth every 6 (six) hours as needed for nausea or vomiting. 30 tablet 1   lidocaine-prilocaine (EMLA) cream Apply to affected area once 30 g 3    Allergies as of 03/19/2023 - Review Complete 03/19/2023  Allergen Reaction Noted   Prednisone Other (See Comments) 09/27/2011   Aggrenox [aspirin-dipyridamole er]  03/19/2023     Review of Systems:    Constitutional: No weight loss, fever or chills Skin: No rash  Cardiovascular: No chest pain Respiratory: No SOB  Gastrointestinal: See HPI and otherwise negative Genitourinary: No dysuria  Neurological: No headache, dizziness or syncope Musculoskeletal: No new muscle or joint pain Hematologic: No bruising Psychiatric: No history of depression or anxiety    Physical Exam:  Vital signs in last 24 hours: Temp:  [97.4 F (36.3 C)-98.4 F (36.9 C)] 97.4  F (36.3 C) (07/09 0902) Pulse Rate:  [65-89] 65 (07/09 0753) Resp:  [15-20] 18 (07/09 0753) BP: (134-156)/(44-62) 140/51 (07/09 0753) SpO2:  [93 %-98 %] 98 % (07/09 0753) Weight:  [67.6 kg-67.9 kg] 67.9 kg (07/08 1655) Last BM Date : 03/19/23 General:   Pleasant Elderly Caucasian female appears to be in NAD, Well developed, Well nourished, alert and cooperative Head:  Normocephalic and atraumatic. Eyes:   PEERL, EOMI. No icterus. Conjunctiva pink. Ears:  Normal auditory acuity. Neck:  Supple Throat: Oral cavity and pharynx without  inflammation, swelling or lesion.  Lungs: Respirations even and unlabored. Lungs clear to auscultation bilaterally.   No wheezes, crackles, or rhonchi.  Heart: Normal S1, S2. No MRG. Regular rate and rhythm. No peripheral edema, cyanosis or pallor.  Abdomen:  Soft, nondistended, nontender. No rebound or guarding. Normal bowel sounds. No appreciable masses or hepatomegaly. Rectal:  Not performed.  Msk:  Symmetrical without gross deformities. Peripheral pulses intact.  Extremities:  Without edema, no deformity or joint abnormality.  Neurologic:  Alert and  oriented x4;  grossly normal neurologically.   Skin:   Dry and intact without significant lesions or rashes. Psychiatric: Demonstrates good judgement and reason without abnormal affect or behaviors.   LAB RESULTS: Recent Labs    03/19/23 1516 03/19/23 2125 03/20/23 0530  WBC 7.0 5.2 5.0  HGB 8.2* 7.9* 8.7*  HCT 26.0* 24.5* 26.6*  PLT 247 214 201   BMET Recent Labs    03/19/23 1516 03/20/23 0530  NA 134* 133*  K 4.0 3.7  CL 97* 98  CO2 28 26  GLUCOSE 165* 116*  BUN 24* 19  CREATININE 0.66 0.62  CALCIUM 9.5 8.4*   LFT Recent Labs    03/20/23 0530  PROT 5.6*  ALBUMIN 3.1*  AST 36  ALT 50*  ALKPHOS 47  BILITOT 0.9   STUDIES: CT ANGIO GI BLEED  Result Date: 03/20/2023 CLINICAL DATA:  Patient with history of colorectal cancer, had a positive occult fecal blood test at the oncologists office today. Being admitted for further workup. Hemoglobin was 8.2. EXAM: CTA ABDOMEN AND PELVIS WITHOUT AND WITH CONTRAST TECHNIQUE: Multidetector CT imaging of the abdomen and pelvis was performed using the standard protocol during bolus administration of intravenous contrast. Multiplanar reconstructed images and MIPs were obtained and reviewed to evaluate the vascular anatomy. RADIATION DOSE REDUCTION: This exam was performed according to the departmental dose-optimization program which includes automated exposure control, adjustment of  the mA and/or kV according to patient size and/or use of iterative reconstruction technique. CONTRAST:  OMNIPAQUE IOHEXOL 350 MG/ML SOLN COMPARISON:  CT abdomen pelvis with IV contrast 01/17/2023, CTA chest 01/20/2023. FINDINGS: VASCULAR Aorta: Heavily calcified. Nonaneurysmal fusiform infrarenal ectasia seen to 2.2 cm. No critical stenosis in the aorta. Celiac: There are ostial calcific plaques superiorly. This causes as much as a 50% vessel origin stenosis. There is mild poststenotic ectasia without aneurysm, dissection or further stenosis. No branch occlusion is seen. SMA: There are ostial wall plaques, additional plaque just proximal to the inflection point, but no flow-limiting stenosis. No branch occlusion. Renals: 2 arteries supply each kidney. On the left there is a very small accessory artery to the upper pole, and a dominant artery arising just inferior to it. The accessory artery is widely patent. The dominant artery has nonstenosing calcific ostial plaques but is widely patent. On the right, the dominant artery arises first. There are ostial wall plaques anteriorly but they are nonstenosing. The smaller lower pole artery  arises from the right anterior aortic wall 1.5 cm distal to this and is widely patent. There are no right renal artery branch occlusions, as far as visualized. IMA: Widely patent.  No branch occlusion. Inflow: There are bilateral heavily calcified common iliac arteries with again noted thrombotic occlusion over most of the right common iliac artery and up to 80% calcific stenosis in mid left common iliac artery. Stenotic flow is visible in the distal right common iliac artery. 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 There are patchy calcifications in the internal iliac arteries without flow-limiting narrowing. The external iliac arteries are tortuous, both showing mild ostial plaques but neither demonstrating stenosis or further  plaques. Proximal Outflow: Bilateral common femoral and visualized portions of the superficial and profunda femoral arteries are patent without evidence of aneurysm, dissection, vasculitis or significant stenosis. There are mild nonstenosing calcific plaques in the common femoral arteries. Veins: Patent. Review of the MIP images confirms the above findings. NON-VASCULAR Lower chest: The heart is normal slightly enlarged. There are calcifications in the mitral ring, right coronary artery. The noncontrast imaging demonstrates low-density intraventricular blood pool compared to the myocardium consistent with anemia. Small hiatal hernia.  Visualized lung bases are clear. Hepatobiliary: 19 cm in length mildly steatotic liver. No mass enhancement. Old cholecystectomy. Stable mild biliary prominence. Pancreas: No abnormality. Spleen: No mass enhancement.  No splenomegaly. Adrenals/Urinary Tract: There is no adrenal mass. Both kidneys enhance homogeneously except for again noted 1.3 cm posterior lower pole right renal cyst with a Hounsfield density of 20. No follow-up imaging is recommended. There is no urinary stone or obstruction. There is trace air in the anterior bladder lumen which could be due to instrumentation, catheterization or infectious process, but there is no bladder thickening. Stomach/Bowel: No contrast extravasation is seen into the bowel. Mild thickened folds are again noted in the stomach and jejunum but without inflammatory changes or small-bowel obstruction. An appendix is not seen in this patient. There are uncomplicated sigmoid diverticula. The large bowel wall is otherwise unremarkable except possibly at the anorectal junction where there may be eccentric wall thickening to the left and posteriorly. Direct visualization is recommended. Lymphatic: No appreciable adenopathy. Reproductive: Uterus and bilateral adnexa are unremarkable. Other: Small umbilical fat hernia. No incarcerated hernia. No free  air, free fluid or free hemorrhage. Musculoskeletal: Osteopenia and degenerative change lumbar spine. There is advanced L4-5 facet hypertrophy with grade 1 spondylolisthesis and severe acquired spinal stenosis. Additional advanced facet hypertrophy at L5-S1 with grade 1 spondylolisthesis without significant spinal stenosis. There is thoracic spondylosis. No acute fractures. IMPRESSION: 1. Heavily calcified aorta with nonaneurysmal ectasia in the infrarenal segment to 2.2 cm. 2. 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 There is reconstitution in the distal right common iliac artery 3. 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 4. No other significant visceral artery stenoses. 5. No visible contrast extravasation into the bowel. 6. Possible eccentric wall thickening at the anorectal junction to the left and posteriorly. Direct visualization is recommended. 7. Trace air in the anterior bladder lumen which could be due to instrumentation, catheterization or infectious process, but there is no bladder thickening. 8. Mildly thickened folds in the stomach and jejunum but without inflammatory changes or small-bowel obstruction. Findings could be due to gastroenteritis or nondistention. 9. Mild hepatic steatosis. 10. Small hiatal  hernia. 11. Umbilical fat hernia. 12. Osteopenia and degenerative change. 13. L4-5 severe acquired spinal stenosis. Electronically Signed   By: Almira Bar M.D.   On: 03/20/2023 00:30   DG Chest 2 View  Result Date: 03/20/2023 CLINICAL DATA:  Wheezing. EXAM: CHEST - 2 VIEW COMPARISON:  Chest radiograph 01/26/2023.  Chest CT 01/20/2023 FINDINGS: Chronic bronchial thickening. Right upper lobe nodular opacity in the periphery is more peripherally located than nodule on prior CT. No new airspace disease. No pleural fluid. The heart is normal in size. Aortic atherosclerosis. No  pneumothorax. On limited assessment, no acute osseous findings. IMPRESSION: 1. Chronic bronchial thickening. 2. Right upper lobe nodular opacity is more peripherally located than nodule on prior CT. Unclear if this is the same nodule or a new nodular density. Electronically Signed   By: Narda Rutherford M.D.   On: 03/20/2023 00:01      Impression / Plan:   Impression: 1.  Melena: Started 03/18/2023, hemoglobin 12.2 on 6/14--> 11.0 on 6/28--> 8.2 on 7/8--> 1 unit PRBCs--> 8.7, BUN elevated at 24 yesterday; consider upper GI bleed most likely, prior duodenal bulb AVM in 2019 2.  Symptomatic acute blood loss anemia: With above 3.  Rectal cancer: Currently following with Dr. Myna Hidalgo for chemo 4.  A-fib: Typically on Eliquis, last dose 03/19/2023  Plan: 1.  Patient would benefit from EGD after Eliquis washout earliest on 03/21/2023 AM.  Will discuss case with Dr. Delcie Roch to find availability for her.  Did go ahead and discussed risks, benefits, limitations and alternatives and the patient agrees to proceed. 2.  For now continue to hold Eliquis 3.  Continue to monitor hemoglobin and transfusion as needed less than 8 4.  Continue Pantoprazole 40 mg IV twice daily 5.  Continue as needed antiemetics 6.  Patient can be on clear liquid diet today, will make her n.p.o. pending timing of procedure  Thank you for your kind consultation, we will continue to follow.  Violet Baldy [facility]  03/20/2023, 9:41 AM     Attending physician's note   I have taken history, reviewed the chart and examined the patient. I performed a substantive portion of this encounter, including complete performance of at least one of the key components, in conjunction with the APP. I agree with the Advanced Practitioner's note, impression and recommendations.   Melena with ABL anemia with drop in Hb 11 (2 weeks ago) to 8 s/p 1U to 8.7. Elevated BUN. Neg CTA. No NSAIDs. Prior duodenal AVM s/p APC 2019 Recently Dx rectal Ca  with possible lung mets/Lynch on Xeloda/Keytruda (Dx on FS 01/2023) Afib on eliquis (last dose 7/8)  Plan: -IV Protonix -Trend CBC -Hold Eliquis -EGD in AM after Eliquis washout -If neg, then colon vs FS. -CTA reviewed -D/W pt and daughter in detail including procedure-risks and benefits. They wish to proceed.  The risks including rare risk of perforation, bleeding, missed UGI neoplasms, risks of anesthesia/sedation.   Edman Circle, MD [facility] [phone]

## 2023-03-20 NOTE — Progress Notes (Signed)
[name] is still in the emergency room.  She has not been able to move up to a bed yet.  Hopefully this will happen today.  She came to the office yesterday with GI bleeding.  She had melena.  She had a hemoglobin of 8.2.  I think she got 1 unit of blood in the hospital.  She had a CT angiogram of the abdomen and pelvis.  There is no obvious source of bleeding.  In fact, the area where she had her tumor certainly did not look to be that prominent.  She is not having any pain.  There is no nausea or vomiting right now.  She had blood earlier this morning.  Her recent blood studies not back yet.  She has had no fever.  There has been no issues with urination.  She had been on Eliquis because of atrial fibrillation.  This has been stopped.  I know that Gastroenterology has been notified.  I am sure they probably will see her today.  I would suspect that they probably will do a upper endoscopy and colonoscopy on her.  Her vital signs show temperature of 97.9.  Pulse 67.  Blood pressure 134/52.  Her head and exam shows some pale conjunctiva.  She has no oral lesions.  She has no adenopathy in the neck.  Lungs are clear bilaterally.  Cardiac exam regular rate and rhythm.  She has no murmurs, rubs or bruits.  Abdomen is soft.  Bowel sounds are present.  There is no guarding or rebound tenderness.  There is no palpable liver or spleen tip.  Extremity shows no clubbing, cyanosis or edema.   [name] is a very nice 89 year old white female.  She has locally advanced rectal cancer.  She has been on treatment with Xeloda and Keytruda.  She, looks like has had a response to least by the CT scan.  I am not sure where she is having the bleeding from.  We will have to see what her labs look like tomorrow.  The Eliquis will have to be stopped.  Again, we will have to follow her labs.  Hopefully, she will be able to be moved to a room today.  I know that she is getting incredible care from everybody in  the ER right now.  As always, her faith remains incredibly strong.   [name], MD  [place] [phone]

## 2023-03-20 NOTE — Plan of Care (Signed)

## 2023-03-20 NOTE — H&P (View-Only) (Signed)
Consultation  Referring Provider: Dr. Sunnie Nielsen   Primary Care Physician:  Creola Corn, MD Primary Gastroenterologist: Dr. Rhea Belton        Reason for Consultation: Melena in the setting of chronic anticoagulation with Eliquis, also history of rectal cancer currently on chemo            HPI:   [name] is a 89 y.o. 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  Associated symptoms include tiredness with rectal pain as well as lightheadedness.  Recently seen by oncology team with a rectal exam showing very small external hemorrhoids, no mass in the rectal vault but stool was black and heme positive.      Today, patient is seen with her daughter by her bedside who assists with her history.  Apparently she started with black stools about 2 to 3 days ago and has had 3 to 4 days since then with the last yesterday morning 03/19/2023.  She is on Eliquis with her last dose Monday morning 03/19/2023.  She did start with nausea over the past 2 to 3 days, helped by what sounds like Zofran given to her by Dr. Myna Hidalgo.  She denies any abdominal pain.  Tells me that she does have some rectal pain when she lays on her back too long in the setting of her known rectal cancer.  Denies palpitations or shortness of breath.    Denies fever, chills or vomiting.  ER course: Hemoglobin 11 (10 days ago)--> 8.2  GI history: 01/19/2023 flex sigmoidoscopy with rectal mass 2.0 cm from anal verge, malignant partially obstructing tumor in the mid rectum and the distal rectum, diverticulosis in the sigmoid  colon 01/15/2023 office visit with Willette Cluster for rectal bleeding 12/03/2017 EGD for IDA with 1 small duodenal bulb AVM, small hiatal hernia  Past Medical History:  Diagnosis Date   Anemia    Anxiety    Aortic stenosis 02/26/2023   TTE 01/21/23: EF 60-65, no RWMA, NL RVSF, NL PASP, RVSP 31.5, mild MR, mild AS, mean 10, Vmax 222 cm/s, DI 0.45, RAP 3   Colon polyps    DDD (degenerative disc disease), cervical    Diabetes mellitus    Diabetes mellitus, type 2 (HCC)    Hyperlipidemia    Hypertension    Hypothyroidism    Leukemia (HCC) in remission   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   Mucous retention cyst of maxillary sinus    Osteoporosis    Pinched nerve In neck   Plantar fasciitis    Stroke [facility])    Vitamin D deficiency     Past Surgical History:  Procedure Laterality Date   APPENDECTOMY     BIOPSY  01/19/2023   Procedure: BIOPSY;  Surgeon: Beverley Fiedler, MD;  Location: [facility];  Service: Gastroenterology;;   CHOLECYSTECTOMY     2004   COLONOSCOPY N/A 02/24/2013   Procedure: COLONOSCOPY;  Surgeon: Meryl Dare, MD;  Location: [facility] ENDOSCOPY;  Service: Endoscopy;  Laterality: N/A;   COLONOSCOPY W/ POLYPECTOMY     COLONOSCOPY WITH PROPOFOL N/A 12/03/2017   Procedure: COLONOSCOPY WITH PROPOFOL;  Surgeon: Rachael Fee,  MD;  Location: [facility] ENDOSCOPY;  Service: Gastroenterology;  Laterality: N/A;   ESOPHAGOGASTRODUODENOSCOPY     ESOPHAGOGASTRODUODENOSCOPY N/A 02/24/2013   Procedure: ESOPHAGOGASTRODUODENOSCOPY (EGD);  Surgeon: Meryl Dare, MD;  Location: [facility];  Service: Endoscopy;  Laterality: N/A;   ESOPHAGOGASTRODUODENOSCOPY (EGD) WITH PROPOFOL N/A 12/03/2017   Procedure: ESOPHAGOGASTRODUODENOSCOPY (EGD) WITH PROPOFOL;  Surgeon: Rachael Fee, MD;  Location: [facility] ENDOSCOPY;  Service: Gastroenterology;  Laterality: N/A;   EYE SURGERY     bilateral catracts   FLEXIBLE SIGMOIDOSCOPY N/A 01/19/2023   Procedure: FLEXIBLE SIGMOIDOSCOPY;  Surgeon: Beverley Fiedler, MD;   Location: [facility] ENDOSCOPY;  Service: Gastroenterology;  Laterality: N/A;    Family History  Problem Relation Age of Onset   Diabetes Other    Colon cancer Son    Esophageal cancer Neg Hx    Stomach cancer Neg Hx     Social History   Tobacco Use   Smoking status: Former    Packs/day: .5    Types: Cigarettes   Smokeless tobacco: Never   Tobacco comments:    Quit about 30 years ago  Vaping Use   Vaping Use: Never used  Substance Use Topics   Alcohol use: No   Drug use: No    Prior to Admission medications   Medication Sig Start Date End Date Taking? Authorizing Provider  acetaminophen (TYLENOL) 325 MG tablet Take 2 tablets (650 mg total) by mouth every 6 (six) hours as needed for mild pain (or Fever >/= 101). 01/28/23  Yes Azucena Fallen, MD  ALPRAZolam Prudy Feeler) 0.5 MG tablet Take 0.5 mg by mouth 3 (three) times daily as needed for anxiety. For anxiety   Yes [provider]  amiodarone (PACERONE) 200 MG tablet Take 1 tablet (200 mg total) by mouth daily. DO NOT START THIS MEDICATION UNTIL PREVIOUS DOSE (400mg ) HAS BEEN COMPLETED Patient taking differently: Take 200 mg by mouth daily. 02/05/23  Yes Azucena Fallen, MD  amLODipine (NORVASC) 5 MG tablet Take 1.5 tablets (7.5 mg total) by mouth daily. 02/26/23 05/27/23 Yes Weaver, Scott T, PA-C  apixaban (ELIQUIS) 5 MG TABS tablet Take 1 tablet (5 mg total) by mouth 2 (two) times daily. 02/27/23  Yes Weaver, Scott T, PA-C  atorvastatin (LIPITOR) 20 MG tablet Take 20 mg by mouth daily.   Yes [provider]  capecitabine (XELODA) 500 MG tablet Take 3 tablets (1,500 mg total) by mouth 2 (two) times daily after a meal. Take within 30 minutes after meals. Take for 14 days on, 7 days off. Repeat every 21 days. 02/09/23  Yes Josph Macho, MD  cholecalciferol (VITAMIN D3) 25 MCG (1000 UNIT) tablet Take 1,000 Units by mouth daily.   Yes [provider]  cyanocobalamin 1000 MCG tablet Take 1 tablet (1,000 mcg  total) by mouth daily. 01/28/23  Yes Azucena Fallen, MD  escitalopram (LEXAPRO) 5 MG tablet Take 5 mg by mouth at bedtime.   Yes [provider]  famotidine (PEPCID) 20 MG tablet Take 20 mg by mouth daily.   Yes [provider]  feeding supplement (ENSURE ENLIVE / ENSURE PLUS) LIQD Take 237 mLs by mouth 3 (three) times daily with meals. Patient taking differently: Take 237 mLs by mouth 3 (three) times daily as needed (nutrition). 01/28/23  Yes Azucena Fallen, MD  fluticasone [facility]) 50 MCG/ACT nasal spray Place 2 sprays into both nostrils daily. Patient taking differently: Place 2 sprays into both nostrils daily as needed for allergies. 01/28/23  Yes Carma Leaven  C, MD  guaiFENesin-dextromethorphan (ROBITUSSIN DM) 100-10 MG/5ML syrup Take 10 mLs by mouth every 4 (four) hours as needed for cough. 01/28/23  Yes Azucena Fallen, MD  HYDROcodone-acetaminophen (NORCO/VICODIN) 5-325 MG tablet Take 1-2 tablets by mouth every 4 (four) hours as needed for moderate pain. 01/28/23  Yes Azucena Fallen, MD  levothyroxine (SYNTHROID) 125 MCG tablet Take 125 mcg by mouth daily before breakfast.   Yes [provider]  loratadine (CLARITIN) 10 MG tablet Take 1 tablet (10 mg total) by mouth daily. 01/28/23  Yes Azucena Fallen, MD  methocarbamol (ROBAXIN) 500 MG tablet Take 1 tablet (500 mg total) by mouth every 8 (eight) hours as needed for muscle spasms (abd pain). 01/28/23  Yes Azucena Fallen, MD  polyethylene glycol (MIRALAX / GLYCOLAX) 17 g packet Take 17 g by mouth daily as needed for mild constipation. Patient taking differently: Take 17 g by mouth at bedtime. 01/28/23  Yes Azucena Fallen, MD  potassium chloride SA (K-DUR,KLOR-CON) 20 MEQ tablet Take 20 mEq by mouth daily.   Yes [provider]  prochlorperazine (COMPAZINE) 10 MG tablet Take 1 tablet (10 mg total) by mouth every 6 (six) hours as needed for nausea or vomiting. 02/08/23   Yes Ennever, Rose Phi, MD  lidocaine-prilocaine (EMLA) cream Apply to affected area once 02/08/23   Josph Macho, MD    Current Facility-Administered Medications  Medication Dose Route Frequency Provider Last Rate Last Admin   0.9 %  sodium chloride infusion   Intravenous Continuous Therisa Doyne, MD 75 mL/hr at 03/20/23 0527 New Bag at 03/20/23 0527   albuterol (PROVENTIL) (2.5 MG/3ML) 0.083% nebulizer solution 2.5 mg  2.5 mg Nebulization Q4H PRN Therisa Doyne, MD       ALPRAZolam Prudy Feeler) tablet 0.5 mg  0.5 mg Oral TID PRN Therisa Doyne, MD       amiodarone (PACERONE) tablet 200 mg  200 mg Oral Daily Doutova, Anastassia, MD       atorvastatin (LIPITOR) tablet 20 mg  20 mg Oral Daily Doutova, Anastassia, MD       escitalopram (LEXAPRO) tablet 5 mg  5 mg Oral QHS Doutova, Anastassia, MD       HYDROcodone-acetaminophen (NORCO/VICODIN) 5-325 MG per tablet 1-2 tablet  1-2 tablet Oral Q4H PRN Therisa Doyne, MD   1 tablet at 03/20/23 0333   insulin aspart (novoLOG) injection 0-9 Units  0-9 Units Subcutaneous Q4H Doutova, Jonny Ruiz, MD   1 Units at 03/20/23 0143   levothyroxine (SYNTHROID) tablet 125 mcg  125 mcg Oral Q0600 Therisa Doyne, MD   125 mcg at 03/20/23 0527   loratadine (CLARITIN) tablet 10 mg  10 mg Oral Daily Doutova, Anastassia, MD       metoCLOPramide (REGLAN) injection 5 mg  5 mg Intravenous Q6H PRN Therisa Doyne, MD       [START ON 03/23/2023] pantoprazole (PROTONIX) injection 40 mg  40 mg Intravenous Q12H Doutova, Anastassia, MD       pantoprozole (PROTONIX) 80 mg /NS 100 mL infusion  8 mg/hr Intravenous Continuous Doutova, Anastassia, MD 10 mL/hr at 03/20/23 0145 8 mg/hr at 03/20/23 0145   Current Outpatient Medications  Medication Sig Dispense Refill   acetaminophen (TYLENOL) 325 MG tablet Take 2 tablets (650 mg total) by mouth every 6 (six) hours as needed for mild pain (or Fever >/= 101). 30 tablet 0   ALPRAZolam (XANAX) 0.5 MG tablet Take  0.5 mg by mouth 3 (three) times daily as needed for anxiety. For  anxiety     amiodarone (PACERONE) 200 MG tablet Take 1 tablet (200 mg total) by mouth daily. DO NOT START THIS MEDICATION UNTIL PREVIOUS DOSE (400mg ) HAS BEEN COMPLETED (Patient taking differently: Take 200 mg by mouth daily.) 30 tablet 0   amLODipine (NORVASC) 5 MG tablet Take 1.5 tablets (7.5 mg total) by mouth daily. 135 tablet 3   apixaban (ELIQUIS) 5 MG TABS tablet Take 1 tablet (5 mg total) by mouth 2 (two) times daily. 180 tablet 1   atorvastatin (LIPITOR) 20 MG tablet Take 20 mg by mouth daily.     capecitabine (XELODA) 500 MG tablet Take 3 tablets (1,500 mg total) by mouth 2 (two) times daily after a meal. Take within 30 minutes after meals. Take for 14 days on, 7 days off. Repeat every 21 days. 84 tablet 5   cholecalciferol (VITAMIN D3) 25 MCG (1000 UNIT) tablet Take 1,000 Units by mouth daily.     cyanocobalamin 1000 MCG tablet Take 1 tablet (1,000 mcg total) by mouth daily. 30 tablet 0   escitalopram (LEXAPRO) 5 MG tablet Take 5 mg by mouth at bedtime.     famotidine (PEPCID) 20 MG tablet Take 20 mg by mouth daily.     feeding supplement (ENSURE ENLIVE / ENSURE PLUS) LIQD Take 237 mLs by mouth 3 (three) times daily with meals. (Patient taking differently: Take 237 mLs by mouth 3 (three) times daily as needed (nutrition).) 237 mL 12   fluticasone (FLONASE) 50 MCG/ACT nasal spray Place 2 sprays into both nostrils daily. (Patient taking differently: Place 2 sprays into both nostrils daily as needed for allergies.) 11.1 mL 0   guaiFENesin-dextromethorphan (ROBITUSSIN DM) 100-10 MG/5ML syrup Take 10 mLs by mouth every 4 (four) hours as needed for cough. 118 mL 0   HYDROcodone-acetaminophen (NORCO/VICODIN) 5-325 MG tablet Take 1-2 tablets by mouth every 4 (four) hours as needed for moderate pain. 30 tablet 0   levothyroxine (SYNTHROID) 125 MCG tablet Take 125 mcg by mouth daily before breakfast.     loratadine (CLARITIN) 10 MG  tablet Take 1 tablet (10 mg total) by mouth daily. 30 tablet 0   methocarbamol (ROBAXIN) 500 MG tablet Take 1 tablet (500 mg total) by mouth every 8 (eight) hours as needed for muscle spasms (abd pain). 10 tablet 0   polyethylene glycol (MIRALAX / GLYCOLAX) 17 g packet Take 17 g by mouth daily as needed for mild constipation. (Patient taking differently: Take 17 g by mouth at bedtime.) 14 each 0   potassium chloride SA (K-DUR,KLOR-CON) 20 MEQ tablet Take 20 mEq by mouth daily.     prochlorperazine (COMPAZINE) 10 MG tablet Take 1 tablet (10 mg total) by mouth every 6 (six) hours as needed for nausea or vomiting. 30 tablet 1   lidocaine-prilocaine (EMLA) cream Apply to affected area once 30 g 3    Allergies as of 03/19/2023 - Review Complete 03/19/2023  Allergen Reaction Noted   Prednisone Other (See Comments) 09/27/2011   Aggrenox [aspirin-dipyridamole er]  03/19/2023     Review of Systems:    Constitutional: No weight loss, fever or chills Skin: No rash  Cardiovascular: No chest pain Respiratory: No SOB  Gastrointestinal: See HPI and otherwise negative Genitourinary: No dysuria  Neurological: No headache, dizziness or syncope Musculoskeletal: No new muscle or joint pain Hematologic: No bruising Psychiatric: No history of depression or anxiety    Physical Exam:  Vital signs in last 24 hours: Temp:  [97.4 F (36.3 C)-98.4 F (36.9 C)] 97.4  F (36.3 C) (07/09 0902) Pulse Rate:  [65-89] 65 (07/09 0753) Resp:  [15-20] 18 (07/09 0753) BP: (134-156)/(44-62) 140/51 (07/09 0753) SpO2:  [93 %-98 %] 98 % (07/09 0753) Weight:  [67.6 kg-67.9 kg] 67.9 kg (07/08 1655) Last BM Date : 03/19/23 General:   Pleasant Elderly Caucasian female appears to be in NAD, Well developed, Well nourished, alert and cooperative Head:  Normocephalic and atraumatic. Eyes:   PEERL, EOMI. No icterus. Conjunctiva pink. Ears:  Normal auditory acuity. Neck:  Supple Throat: Oral cavity and pharynx without  inflammation, swelling or lesion.  Lungs: Respirations even and unlabored. Lungs clear to auscultation bilaterally.   No wheezes, crackles, or rhonchi.  Heart: Normal S1, S2. No MRG. Regular rate and rhythm. No peripheral edema, cyanosis or pallor.  Abdomen:  Soft, nondistended, nontender. No rebound or guarding. Normal bowel sounds. No appreciable masses or hepatomegaly. Rectal:  Not performed.  Msk:  Symmetrical without gross deformities. Peripheral pulses intact.  Extremities:  Without edema, no deformity or joint abnormality.  Neurologic:  Alert and  oriented x4;  grossly normal neurologically.   Skin:   Dry and intact without significant lesions or rashes. Psychiatric: Demonstrates good judgement and reason without abnormal affect or behaviors.   LAB RESULTS: Recent Labs    03/19/23 1516 03/19/23 2125 03/20/23 0530  WBC 7.0 5.2 5.0  HGB 8.2* 7.9* 8.7*  HCT 26.0* 24.5* 26.6*  PLT 247 214 201   BMET Recent Labs    03/19/23 1516 03/20/23 0530  NA 134* 133*  K 4.0 3.7  CL 97* 98  CO2 28 26  GLUCOSE 165* 116*  BUN 24* 19  CREATININE 0.66 0.62  CALCIUM 9.5 8.4*   LFT Recent Labs    03/20/23 0530  PROT 5.6*  ALBUMIN 3.1*  AST 36  ALT 50*  ALKPHOS 47  BILITOT 0.9   STUDIES: CT ANGIO GI BLEED  Result Date: 03/20/2023 CLINICAL DATA:  Patient with history of colorectal cancer, had a positive occult fecal blood test at the oncologists office today. Being admitted for further workup. Hemoglobin was 8.2. EXAM: CTA ABDOMEN AND PELVIS WITHOUT AND WITH CONTRAST TECHNIQUE: Multidetector CT imaging of the abdomen and pelvis was performed using the standard protocol during bolus administration of intravenous contrast. Multiplanar reconstructed images and MIPs were obtained and reviewed to evaluate the vascular anatomy. RADIATION DOSE REDUCTION: This exam was performed according to the departmental dose-optimization program which includes automated exposure control, adjustment of  the mA and/or kV according to patient size and/or use of iterative reconstruction technique. CONTRAST:  OMNIPAQUE IOHEXOL 350 MG/ML SOLN COMPARISON:  CT abdomen pelvis with IV contrast 01/17/2023, CTA chest 01/20/2023. FINDINGS: VASCULAR Aorta: Heavily calcified. Nonaneurysmal fusiform infrarenal ectasia seen to 2.2 cm. No critical stenosis in the aorta. Celiac: There are ostial calcific plaques superiorly. This causes as much as a 50% vessel origin stenosis. There is mild poststenotic ectasia without aneurysm, dissection or further stenosis. No branch occlusion is seen. SMA: There are ostial wall plaques, additional plaque just proximal to the inflection point, but no flow-limiting stenosis. No branch occlusion. Renals: 2 arteries supply each kidney. On the left there is a very small accessory artery to the upper pole, and a dominant artery arising just inferior to it. The accessory artery is widely patent. The dominant artery has nonstenosing calcific ostial plaques but is widely patent. On the right, the dominant artery arises first. There are ostial wall plaques anteriorly but they are nonstenosing. The smaller lower pole artery  arises from the right anterior aortic wall 1.5 cm distal to this and is widely patent. There are no right renal artery branch occlusions, as far as visualized. IMA: Widely patent.  No branch occlusion. Inflow: There are bilateral heavily calcified common iliac arteries with again noted thrombotic occlusion over most of the right common iliac artery and up to 80% calcific stenosis in mid left common iliac artery. Stenotic flow is visible in the distal right common iliac artery. 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 There are patchy calcifications in the internal iliac arteries without flow-limiting narrowing. The external iliac arteries are tortuous, both showing mild ostial plaques but neither demonstrating stenosis or further  plaques. Proximal Outflow: Bilateral common femoral and visualized portions of the superficial and profunda femoral arteries are patent without evidence of aneurysm, dissection, vasculitis or significant stenosis. There are mild nonstenosing calcific plaques in the common femoral arteries. Veins: Patent. Review of the MIP images confirms the above findings. NON-VASCULAR Lower chest: The heart is normal slightly enlarged. There are calcifications in the mitral ring, right coronary artery. The noncontrast imaging demonstrates low-density intraventricular blood pool compared to the myocardium consistent with anemia. Small hiatal hernia.  Visualized lung bases are clear. Hepatobiliary: 19 cm in length mildly steatotic liver. No mass enhancement. Old cholecystectomy. Stable mild biliary prominence. Pancreas: No abnormality. Spleen: No mass enhancement.  No splenomegaly. Adrenals/Urinary Tract: There is no adrenal mass. Both kidneys enhance homogeneously except for again noted 1.3 cm posterior lower pole right renal cyst with a Hounsfield density of 20. No follow-up imaging is recommended. There is no urinary stone or obstruction. There is trace air in the anterior bladder lumen which could be due to instrumentation, catheterization or infectious process, but there is no bladder thickening. Stomach/Bowel: No contrast extravasation is seen into the bowel. Mild thickened folds are again noted in the stomach and jejunum but without inflammatory changes or small-bowel obstruction. An appendix is not seen in this patient. There are uncomplicated sigmoid diverticula. The large bowel wall is otherwise unremarkable except possibly at the anorectal junction where there may be eccentric wall thickening to the left and posteriorly. Direct visualization is recommended. Lymphatic: No appreciable adenopathy. Reproductive: Uterus and bilateral adnexa are unremarkable. Other: Small umbilical fat hernia. No incarcerated hernia. No free  air, free fluid or free hemorrhage. Musculoskeletal: Osteopenia and degenerative change lumbar spine. There is advanced L4-5 facet hypertrophy with grade 1 spondylolisthesis and severe acquired spinal stenosis. Additional advanced facet hypertrophy at L5-S1 with grade 1 spondylolisthesis without significant spinal stenosis. There is thoracic spondylosis. No acute fractures. IMPRESSION: 1. Heavily calcified aorta with nonaneurysmal ectasia in the infrarenal segment to 2.2 cm. 2. 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 There is reconstitution in the distal right common iliac artery 3. 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 4. No other significant visceral artery stenoses. 5. No visible contrast extravasation into the bowel. 6. Possible eccentric wall thickening at the anorectal junction to the left and posteriorly. Direct visualization is recommended. 7. Trace air in the anterior bladder lumen which could be due to instrumentation, catheterization or infectious process, but there is no bladder thickening. 8. Mildly thickened folds in the stomach and jejunum but without inflammatory changes or small-bowel obstruction. Findings could be due to gastroenteritis or nondistention. 9. Mild hepatic steatosis. 10. Small hiatal  hernia. 11. Umbilical fat hernia. 12. Osteopenia and degenerative change. 13. L4-5 severe acquired spinal stenosis. Electronically Signed   By: Almira Bar M.D.   On: 03/20/2023 00:30   DG Chest 2 View  Result Date: 03/20/2023 CLINICAL DATA:  Wheezing. EXAM: CHEST - 2 VIEW COMPARISON:  Chest radiograph 01/26/2023.  Chest CT 01/20/2023 FINDINGS: Chronic bronchial thickening. Right upper lobe nodular opacity in the periphery is more peripherally located than nodule on prior CT. No new airspace disease. No pleural fluid. The heart is normal in size. Aortic atherosclerosis. No  pneumothorax. On limited assessment, no acute osseous findings. IMPRESSION: 1. Chronic bronchial thickening. 2. Right upper lobe nodular opacity is more peripherally located than nodule on prior CT. Unclear if this is the same nodule or a new nodular density. Electronically Signed   By: Narda Rutherford M.D.   On: 03/20/2023 00:01      Impression / Plan:   Impression: 1.  Melena: Started 03/18/2023, hemoglobin 12.2 on 6/14--> 11.0 on 6/28--> 8.2 on 7/8--> 1 unit PRBCs--> 8.7, BUN elevated at 24 yesterday; consider upper GI bleed most likely, prior duodenal bulb AVM in 2019 2.  Symptomatic acute blood loss anemia: With above 3.  Rectal cancer: Currently following with Dr. Myna Hidalgo for chemo 4.  A-fib: Typically on Eliquis, last dose 03/19/2023  Plan: 1.  Patient would benefit from EGD after Eliquis washout earliest on 03/21/2023 AM.  Will discuss case with Dr. Delcie Roch to find availability for her.  Did go ahead and discussed risks, benefits, limitations and alternatives and the patient agrees to proceed. 2.  For now continue to hold Eliquis 3.  Continue to monitor hemoglobin and transfusion as needed less than 8 4.  Continue Pantoprazole 40 mg IV twice daily 5.  Continue as needed antiemetics 6.  Patient can be on clear liquid diet today, will make her n.p.o. pending timing of procedure  Thank you for your kind consultation, we will continue to follow.  Violet Baldy [facility]  03/20/2023, 9:41 AM     Attending physician's note   I have taken history, reviewed the chart and examined the patient. I performed a substantive portion of this encounter, including complete performance of at least one of the key components, in conjunction with the APP. I agree with the Advanced Practitioner's note, impression and recommendations.   Melena with ABL anemia with drop in Hb 11 (2 weeks ago) to 8 s/p 1U to 8.7. Elevated BUN. Neg CTA. No NSAIDs. Prior duodenal AVM s/p APC 2019 Recently Dx rectal Ca  with possible lung mets/Lynch on Xeloda/Keytruda (Dx on FS 01/2023) Afib on eliquis (last dose 7/8)  Plan: -IV Protonix -Trend CBC -Hold Eliquis -EGD in AM after Eliquis washout -If neg, then colon vs FS. -CTA reviewed -D/W pt and daughter in detail including procedure-risks and benefits. They wish to proceed.  The risks including rare risk of perforation, bleeding, missed UGI neoplasms, risks of anesthesia/sedation.   Edman Circle, MD [facility] [phone]

## 2023-03-20 NOTE — ED Notes (Signed)
I ambulated pt with pulse ox, pt o2 sat at low 90-93%.

## 2023-03-20 NOTE — Progress Notes (Signed)
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  She reported lightheadedness.  She was evaluated at her oncologist office and she had very small external hemorrhoid, ocular blood was positive. Hb 8.7     Assessment & Plan:   Principal Problem:   Upper GI bleed Active Problems:   Symptomatic anemia   Hyperlipidemia   Hypothyroidism   Diabetes mellitus without complication (HCC)   HTN (hypertension)   Rectal cancer (HCC)   Paroxysmal atrial fibrillation (HCC)   Wheezing  1-GI Bleed: presents with melena.  Received one unit PRBC.  GI consulted.  On Protonix Gtt.  Plan for endoscopy earliest 7/10 to allows eliquis washout.  CTA; negative for active bleeding.   2-Symptomatic Anemia:  Received one unit PRBC.  Monitor hb.   Hyperlipidemia: Continue with Lipitor  Diabetes : Continue with a sliding scale insulin  HTN: Hold blood pressure medication  Hypothyroidism: Continue with Synthroid  Rectal cancer:  Follow-up with Dr. Myna Hidalgo.  PAF; continue to hold Eliquis in the setting of GI bleed Continue with amiodarone  Respiratory System: Wheezing.  As needed nebulizer. Chest x ray; chronic bronchial thickening.       Estimated body mass index is 29.22 kg/m as calculated from the following:   Height as of this encounter: 5' (1.524 m).   Weight as of this encounter: 67.9 kg.   DVT prophylaxis: SCD Code Status: Full code Family Communication: daughter at bedside.  Disposition Plan:  Status is: Inpatient Remains inpatient appropriate because: management of GI bleed.     Consultants:   GI Dr Myna Hidalgo  Procedures:    Antimicrobials:    Subjective: She report last Black stool episode yesterday. Denies abdominal pain.   Objective: Vitals:   03/20/23 0430 03/20/23 0434 03/20/23 0522 03/20/23 0753  BP: (!) 146/50 (!) 146/50 (!) 134/52 (!) 140/51  Pulse: 66 67 67 65  Resp:  18 18 18   Temp:   97.9 F (36.6 C)   TempSrc:   Oral   SpO2: 96% 95%  98%  Weight:      Height:        Intake/Output Summary (Last 24 hours) at 03/20/2023 0828 Last data filed at 03/20/2023 0522 Gross per 24 hour  Intake 317 ml  Output --  Net 317 ml   Filed Weights   03/19/23 1655  Weight: 67.9 kg    Examination:  General exam: Appears calm and comfortable  Respiratory system: Clear to auscultation. Respiratory effort normal. Cardiovascular system: S1 & S2 heard, RRR. No JVD, murmurs, rubs, gallops or clicks. No pedal edema. Gastrointestinal system: Abdomen is nondistended, soft and nontender. No organomegaly or masses felt. Normal bowel sounds heard. Central nervous system: Alert and oriented. Extremities: Symmetric 5 x 5 power.   Data Reviewed: I have personally reviewed following labs and imaging studies  CBC: Recent Labs  Lab 03/19/23 1516 03/19/23 2125 03/20/23 0530  WBC 7.0 5.2 5.0  NEUTROABS 3.7  --   --   HGB 8.2* 7.9* 8.7*  HCT 26.0* 24.5* 26.6*  MCV 100.0 98.4 96.7  PLT 247 214 201   Basic Metabolic Panel: Recent Labs  Lab 03/19/23 1516 03/19/23 2030 03/20/23 0530  NA  134*  --  133*  K 4.0  --  3.7  CL 97*  --  98  CO2 28  --  26  GLUCOSE 165*  --  116*  BUN 24*  --  19  CREATININE 0.66  --  0.62  CALCIUM 9.5  --  8.4*  MG  --  2.2  --   PHOS  --  4.2  --    GFR: Estimated Creatinine Clearance: 39.4 mL/min (by C-G formula based on SCr of 0.62 mg/dL). Liver Function Tests: Recent Labs  Lab 03/19/23 1516 03/20/23 0530  AST 39 36  ALT 52* 50*  ALKPHOS 64 47  BILITOT 0.8 0.9  PROT 6.7 5.6*  ALBUMIN 4.0 3.1*   No results for input(s):  "LIPASE", "AMYLASE" in the last 168 hours. No results for input(s): "AMMONIA" in the last 168 hours. Coagulation Profile: No results for input(s): "INR", "PROTIME" in the last 168 hours. Cardiac Enzymes: Recent Labs  Lab 03/19/23 2030  CKTOTAL 48   BNP (last 3 results) No results for input(s): "PROBNP" in the last 8760 hours. HbA1C: No results for input(s): "HGBA1C" in the last 72 hours. CBG: Recent Labs  Lab 03/20/23 0019 03/20/23 0349 03/20/23 0803  GLUCAP 141* 107* 113*   Lipid Profile: No results for input(s): "CHOL", "HDL", "LDLCALC", "TRIG", "CHOLHDL", "LDLDIRECT" in the last 72 hours. Thyroid Function Tests: No results for input(s): "TSH", "T4TOTAL", "FREET4", "T3FREE", "THYROIDAB" in the last 72 hours. Anemia Panel: Recent Labs    03/19/23 1529  FERRITIN 57   Sepsis Labs: No results for input(s): "PROCALCITON", "LATICACIDVEN" in the last 168 hours.  No results found for this or any previous visit (from the past 240 hour(s)).       Radiology Studies: CT ANGIO GI BLEED  Result Date: 03/20/2023 CLINICAL DATA:  Patient with history of colorectal cancer, had a positive occult fecal blood test at the oncologists office today. Being admitted for further workup. Hemoglobin was 8.2. EXAM: CTA ABDOMEN AND PELVIS WITHOUT AND WITH CONTRAST TECHNIQUE: Multidetector CT imaging of the abdomen and pelvis was performed using the standard protocol during bolus administration of intravenous contrast. Multiplanar reconstructed images and MIPs were obtained and reviewed to evaluate the vascular anatomy. RADIATION DOSE REDUCTION: This exam was performed according to the departmental dose-optimization program which includes automated exposure control, adjustment of the mA and/or kV according to patient size and/or use of iterative reconstruction technique. CONTRAST:  OMNIPAQUE IOHEXOL 350 MG/ML SOLN COMPARISON:  CT abdomen pelvis with IV contrast 01/17/2023, CTA chest 01/20/2023.  FINDINGS: VASCULAR Aorta: Heavily calcified. Nonaneurysmal fusiform infrarenal ectasia seen to 2.2 cm. No critical stenosis in the aorta. Celiac: There are ostial calcific plaques superiorly. This causes as much as a 50% vessel origin stenosis. There is mild poststenotic ectasia without aneurysm, dissection or further stenosis. No branch occlusion is seen. SMA: There are ostial wall plaques, additional plaque just proximal to the inflection point, but no flow-limiting stenosis. No branch occlusion. Renals: 2 arteries supply each kidney. On the left there is a very small accessory artery to the upper pole, and a dominant artery arising just inferior to it. The accessory artery is widely patent. The dominant artery has nonstenosing calcific ostial plaques but is widely patent. On the right, the dominant artery arises first. There are ostial wall plaques anteriorly but they are nonstenosing. The smaller lower pole artery arises from the right anterior aortic wall 1.5 cm distal to this and is widely patent. There are no right  renal artery branch occlusions, as far as visualized. IMA: Widely patent.  No branch occlusion. Inflow: There are bilateral heavily calcified common iliac arteries with again noted thrombotic occlusion over most of the right common iliac artery and up to 80% calcific stenosis in mid left common iliac artery. Stenotic flow is visible in the distal right common iliac artery. 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 There are patchy calcifications in the internal iliac arteries without flow-limiting narrowing. The external iliac arteries are tortuous, both showing mild ostial plaques but neither demonstrating stenosis or further plaques. Proximal Outflow: Bilateral common femoral and visualized portions of the superficial and profunda femoral arteries are patent without evidence of aneurysm, dissection, vasculitis or significant stenosis. There are  mild nonstenosing calcific plaques in the common femoral arteries. Veins: Patent. Review of the MIP images confirms the above findings. NON-VASCULAR Lower chest: The heart is normal slightly enlarged. There are calcifications in the mitral ring, right coronary artery. The noncontrast imaging demonstrates low-density intraventricular blood pool compared to the myocardium consistent with anemia. Small hiatal hernia.  Visualized lung bases are clear. Hepatobiliary: 19 cm in length mildly steatotic liver. No mass enhancement. Old cholecystectomy. Stable mild biliary prominence. Pancreas: No abnormality. Spleen: No mass enhancement.  No splenomegaly. Adrenals/Urinary Tract: There is no adrenal mass. Both kidneys enhance homogeneously except for again noted 1.3 cm posterior lower pole right renal cyst with a Hounsfield density of 20. No follow-up imaging is recommended. There is no urinary stone or obstruction. There is trace air in the anterior bladder lumen which could be due to instrumentation, catheterization or infectious process, but there is no bladder thickening. Stomach/Bowel: No contrast extravasation is seen into the bowel. Mild thickened folds are again noted in the stomach and jejunum but without inflammatory changes or small-bowel obstruction. An appendix is not seen in this patient. There are uncomplicated sigmoid diverticula. The large bowel wall is otherwise unremarkable except possibly at the anorectal junction where there may be eccentric wall thickening to the left and posteriorly. Direct visualization is recommended. Lymphatic: No appreciable adenopathy. Reproductive: Uterus and bilateral adnexa are unremarkable. Other: Small umbilical fat hernia. No incarcerated hernia. No free air, free fluid or free hemorrhage. Musculoskeletal: Osteopenia and degenerative change lumbar spine. There is advanced L4-5 facet hypertrophy with grade 1 spondylolisthesis and severe acquired spinal stenosis. Additional  advanced facet hypertrophy at L5-S1 with grade 1 spondylolisthesis without significant spinal stenosis. There is thoracic spondylosis. No acute fractures. IMPRESSION: 1. Heavily calcified aorta with nonaneurysmal ectasia in the infrarenal segment to 2.2 cm. 2. 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 There is reconstitution in the distal right common iliac artery 3. 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 4. No other significant visceral artery stenoses. 5. No visible contrast extravasation into the bowel. 6. Possible eccentric wall thickening at the anorectal junction to the left and posteriorly. Direct visualization is recommended. 7. Trace air in the anterior bladder lumen which could be due to instrumentation, catheterization or infectious process, but there is no bladder thickening. 8. Mildly thickened folds in the stomach and jejunum but without inflammatory changes or small-bowel obstruction. Findings could be due to gastroenteritis or nondistention. 9. Mild hepatic steatosis. 10. Small hiatal hernia. 11. Umbilical fat hernia. 12. Osteopenia and degenerative change. 13. L4-5 severe acquired spinal stenosis. Electronically Signed  By: Almira Bar M.D.   On: 03/20/2023 00:30   DG Chest 2 View  Result Date: 03/20/2023 CLINICAL DATA:  Wheezing. EXAM: CHEST - 2 VIEW COMPARISON:  Chest radiograph 01/26/2023.  Chest CT 01/20/2023 FINDINGS: Chronic bronchial thickening. Right upper lobe nodular opacity in the periphery is more peripherally located than nodule on prior CT. No new airspace disease. No pleural fluid. The heart is normal in size. Aortic atherosclerosis. No pneumothorax. On limited assessment, no acute osseous findings. IMPRESSION: 1. Chronic bronchial thickening. 2. Right upper lobe nodular opacity is more peripherally located than nodule on prior CT. Unclear if this is the  same nodule or a new nodular density. Electronically Signed   By: Narda Rutherford M.D.   On: 03/20/2023 00:01        Scheduled Meds:  amiodarone  200 mg Oral Daily   atorvastatin  20 mg Oral Daily   escitalopram  5 mg Oral QHS   insulin aspart  0-9 Units Subcutaneous Q4H   levothyroxine  125 mcg Oral Q0600   loratadine  10 mg Oral Daily   [START ON 03/23/2023] pantoprazole  40 mg Intravenous Q12H   Continuous Infusions:  sodium chloride 75 mL/hr at 03/20/23 0527   pantoprazole 8 mg/hr (03/20/23 0145)     LOS: 1 day    Time spent: 35 minutes    Fin Hupp A Matti Killingsworth, MD [facility]   If 7PM-7AM, please contact night-coverage [email or web address]  03/20/2023, 8:28 AM

## 2023-03-20 NOTE — ED Notes (Signed)
ED TO INPATIENT HANDOFF REPORT  ED Nurse Name and Phone #: Crist Infante RN [phone]  S Name/Age/Gender [name] 89 y.o. female Room/Bed: WA13/WA13  Code Status   Code Status: Full Code  Home/SNF/Other Home Patient oriented to: self, place, time, and situation Is this baseline? Yes   Triage Complete: Triage complete  Chief Complaint Upper GI bleed [K92.2] GI bleed [K92.2]  Triage Note Pt endorses rectal bleeding that started yesterday. Dark blood in stool. Pt has colorectal cancer. Pt taken eliquis, endorses weakness. Denies any pain. Hgb went from 11 10 days ago to 8.2 today.   Allergies Allergies  Allergen Reactions   Prednisone Other (See Comments)    Climbs the walls   Aggrenox [Aspirin-Dipyridamole Er]     Severe Bleeding and Stomach Pain    Level of Care/Admitting Diagnosis ED Disposition     ED Disposition  Admit   Condition  --   Comment  Hospital Area: [facility] [[id number]]  Level of Care: Telemetry [5]  Admit to tele based on following criteria: Monitor QTC interval  May admit patient to [facility] or [facility] if equivalent level of care is available:: No  Covid Evaluation: Asymptomatic - no recent exposure (last 10 days) testing not required  Diagnosis: GI bleed [454098]  Admitting Physician: Therisa Doyne [[id number]]  Attending Physician: Alba Cory [phone]  Certification:: I certify this patient will need inpatient services for at least 2 midnights          B Medical/Surgery History Past Medical History:  Diagnosis Date   Anemia    Anxiety    Aortic stenosis 02/26/2023   TTE 01/21/23: EF 60-65, no RWMA, NL RVSF, NL PASP, RVSP 31.5, mild MR, mild AS, mean 10, Vmax 222 cm/s, DI 0.45, RAP 3   Colon polyps    DDD (degenerative disc disease), cervical    Diabetes mellitus    Diabetes mellitus, type 2 (HCC)    Hyperlipidemia    Hypertension    Hypothyroidism    Leukemia (HCC) in remission   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   Mucous retention cyst of maxillary sinus    Osteoporosis    Pinched nerve In neck   Plantar fasciitis    Stroke [facility])    Vitamin D deficiency    Past Surgical History:  Procedure Laterality Date   APPENDECTOMY     BIOPSY  01/19/2023   Procedure: BIOPSY;  Surgeon: Beverley Fiedler, MD;  Location: [facility];  Service: Gastroenterology;;   CHOLECYSTECTOMY     2004   COLONOSCOPY N/A 02/24/2013   Procedure: COLONOSCOPY;  Surgeon: Meryl Dare, MD;  Location: [facility];  Service: Endoscopy;  Laterality: N/A;   COLONOSCOPY W/ POLYPECTOMY     COLONOSCOPY WITH PROPOFOL N/A 12/03/2017   Procedure: COLONOSCOPY WITH PROPOFOL;  Surgeon: Rachael Fee, MD;  Location: [facility];  Service: Gastroenterology;  Laterality: N/A;   ESOPHAGOGASTRODUODENOSCOPY     ESOPHAGOGASTRODUODENOSCOPY N/A 02/24/2013   Procedure: ESOPHAGOGASTRODUODENOSCOPY (EGD);  Surgeon: Meryl Dare, MD;  Location: [facility];  Service: Endoscopy;  Laterality: N/A;   ESOPHAGOGASTRODUODENOSCOPY (EGD) WITH PROPOFOL N/A 12/03/2017   Procedure: ESOPHAGOGASTRODUODENOSCOPY (EGD) WITH PROPOFOL;  Surgeon: Rachael Fee, MD;  Location: [facility] ENDOSCOPY;  Service: Gastroenterology;  Laterality: N/A;   EYE SURGERY     bilateral catracts   FLEXIBLE SIGMOIDOSCOPY N/A 01/19/2023   Procedure: FLEXIBLE SIGMOIDOSCOPY;  Surgeon: Beverley Fiedler, MD;  Location: [facility];  Service: Gastroenterology;  Laterality: N/A;  A IV Location/Drains/Wounds Patient Lines/Drains/Airways Status     Active Line/Drains/Airways     Name Placement date Placement time Site Days   Peripheral IV 03/19/23 20 G 1" Anterior;Distal;Right Forearm 03/19/23  2054  Forearm  1   Peripheral IV 03/20/23 22 G 1.75" Anterior;Left Forearm 03/20/23  0057  Forearm  less than 1            Intake/Output Last 24 hours  Intake/Output Summary (Last 24 hours) at 03/20/2023 1252 Last data filed at 03/20/2023 6578 Gross per 24 hour  Intake 417.96 ml   Output --  Net 417.96 ml    Labs/Imaging Results for orders placed or performed during the hospital encounter of 03/19/23 (from the past 48 hour(s))  CK     Status: None   Collection Time: 03/19/23  8:30 PM  Result Value Ref Range   Total CK 48 38 - 234 U/L    Comment: HEMOLYSIS AT THIS LEVEL MAY AFFECT RESULT Performed at [facility], [address], [place], [place] [address]   Magnesium     Status: None   Collection Time: 03/19/23  8:30 PM  Result Value Ref Range   Magnesium 2.2 1.7 - 2.4 mg/dL    Comment: Performed at [facility], [address], [place], [place] [address]  Phosphorus     Status: None   Collection Time: 03/19/23  8:30 PM  Result Value Ref Range   Phosphorus 4.2 2.5 - 4.6 mg/dL    Comment: HEMOLYSIS AT THIS LEVEL MAY AFFECT RESULT Performed at [facility], [address] [place], [place] [address]   Type and screen [facility]     Status: None (Preliminary result)   Collection Time: 03/19/23  8:35 PM  Result Value Ref Range   ABO/RH(D) O POS    Antibody Screen NEG    Sample Expiration 03/22/2023,2359    Unit Number [id number]    Blood Component Type RCLI PHER 1    Unit division 00    Status of Unit ISSUED    Transfusion Status OK TO TRANSFUSE    Crossmatch Result      Compatible Performed at [facility], [address] [place], [place] [address]   Prepare RBC (crossmatch)     Status: None   Collection Time: 03/19/23  8:35 PM  Result Value Ref Range   Order Confirmation      ORDER PROCESSED BY BLOOD BANK Performed at [facility], [address], [place], [place] [address]   CBC     Status: Abnormal   Collection Time: 03/19/23  9:25 PM  Result Value Ref Range   WBC 5.2 4.0 - 10.5 K/uL   RBC 2.49 (L) 3.87 - 5.11 MIL/uL   Hemoglobin 7.9 (L) 12.0 - 15.0 g/dL   HCT 87.5 (L) 64.3 - 32.9 %   MCV 98.4 80.0 - 100.0 fL   MCH 31.7  26.0 - 34.0 pg   MCHC 32.2 30.0 - 36.0 g/dL   RDW 51.8 (H) 84.1 - 66.0 %   Platelets 214 150 - 400 K/uL   nRBC 0.0 0.0 - 0.2 %    Comment: Performed at [facility], [address] [place], [place] [address]  CBG monitoring, ED     Status: Abnormal   Collection Time: 03/20/23 12:19 AM  Result Value Ref Range   Glucose-Capillary 141 (H) 70 - 99 mg/dL    Comment: Glucose reference range  applies only to samples taken after fasting for at least 8 hours.  CBG monitoring, ED     Status: Abnormal   Collection Time: 03/20/23  3:49 AM  Result Value Ref Range   Glucose-Capillary 107 (H) 70 - 99 mg/dL    Comment: Glucose reference range applies only to samples taken after fasting for at least 8 hours.  Prealbumin     Status: Abnormal   Collection Time: 03/20/23  5:30 AM  Result Value Ref Range   Prealbumin 17 (L) 18 - 38 mg/dL    Comment: Performed at [facility], [address], [place], [place] [address]  Comprehensive metabolic panel     Status: Abnormal   Collection Time: 03/20/23  5:30 AM  Result Value Ref Range   Sodium 133 (L) 135 - 145 mmol/L   Potassium 3.7 3.5 - 5.1 mmol/L   Chloride 98 98 - 111 mmol/L   CO2 26 22 - 32 mmol/L   Glucose, Bld 116 (H) 70 - 99 mg/dL    Comment: Glucose reference range applies only to samples taken after fasting for at least 8 hours.   BUN 19 8 - 23 mg/dL   Creatinine, Ser 6.04 0.44 - 1.00 mg/dL   Calcium 8.4 (L) 8.9 - 10.3 mg/dL   Total Protein 5.6 (L) 6.5 - 8.1 g/dL   Albumin 3.1 (L) 3.5 - 5.0 g/dL   AST 36 15 - 41 U/L   ALT 50 (H) 0 - 44 U/L   Alkaline Phosphatase 47 38 - 126 U/L   Total Bilirubin 0.9 0.3 - 1.2 mg/dL   GFR, Estimated >54 >09 mL/min    Comment: (NOTE) Calculated using the CKD-EPI Creatinine Equation (2021)    Anion gap 9 5 - 15    Comment: Performed at [facility], [address] [place], [place] [address]  CBC     Status: Abnormal   Collection Time: 03/20/23  5:30 AM  Result  Value Ref Range   WBC 5.0 4.0 - 10.5 K/uL   RBC 2.75 (L) 3.87 - 5.11 MIL/uL   Hemoglobin 8.7 (L) 12.0 - 15.0 g/dL   HCT 47.8 (L) 29.5 - 62.1 %   MCV 96.7 80.0 - 100.0 fL   MCH 31.6 26.0 - 34.0 pg   MCHC 32.7 30.0 - 36.0 g/dL   RDW 30.8 (H) 65.7 - 84.6 %   Platelets 201 150 - 400 K/uL   nRBC 0.0 0.0 - 0.2 %    Comment: Performed at [facility], [address], [place], [place] [address]  CBG monitoring, ED     Status: Abnormal   Collection Time: 03/20/23  8:03 AM  Result Value Ref Range   Glucose-Capillary 113 (H) 70 - 99 mg/dL    Comment: Glucose reference range applies only to samples taken after fasting for at least 8 hours.  CBC     Status: Abnormal   Collection Time: 03/20/23  9:58 AM  Result Value Ref Range   WBC 6.2 4.0 - 10.5 K/uL   RBC 2.81 (L) 3.87 - 5.11 MIL/uL   Hemoglobin 9.2 (L) 12.0 - 15.0 g/dL   HCT 28.4 (L) 13.2 - 44.0 %   MCV 97.5 80.0 - 100.0 fL   MCH 32.7 26.0 - 34.0 pg   MCHC 33.6 30.0 - 36.0 g/dL   RDW 10.2 (H) 72.5 - 36.6 %   Platelets 214 150 - 400 K/uL   nRBC 0.0 0.0 - 0.2 %  Comment: Performed at [facility], [address], [place], [place] [address]  TSH     Status: Abnormal   Collection Time: 03/20/23 10:31 AM  Result Value Ref Range   TSH 5.468 (H) 0.350 - 4.500 uIU/mL    Comment: Performed by a 3rd Generation assay with a functional sensitivity of <=0.01 uIU/mL. Performed at [facility], [address], [place], [place] [address]   CBG monitoring, ED     Status: Abnormal   Collection Time: 03/20/23 11:43 AM  Result Value Ref Range   Glucose-Capillary 143 (H) 70 - 99 mg/dL    Comment: Glucose reference range applies only to samples taken after fasting for at least 8 hours.   CT ANGIO GI BLEED  Result Date: 03/20/2023 CLINICAL DATA:  Patient with history of colorectal cancer, had a positive occult fecal blood test at the oncologists office today. Being admitted for further workup.  Hemoglobin was 8.2. EXAM: CTA ABDOMEN AND PELVIS WITHOUT AND WITH CONTRAST TECHNIQUE: Multidetector CT imaging of the abdomen and pelvis was performed using the standard protocol during bolus administration of intravenous contrast. Multiplanar reconstructed images and MIPs were obtained and reviewed to evaluate the vascular anatomy. RADIATION DOSE REDUCTION: This exam was performed according to the departmental dose-optimization program which includes automated exposure control, adjustment of the mA and/or kV according to patient size and/or use of iterative reconstruction technique. CONTRAST:  OMNIPAQUE IOHEXOL 350 MG/ML SOLN COMPARISON:  CT abdomen pelvis with IV contrast 01/17/2023, CTA chest 01/20/2023. FINDINGS: VASCULAR Aorta: Heavily calcified. Nonaneurysmal fusiform infrarenal ectasia seen to 2.2 cm. No critical stenosis in the aorta. Celiac: There are ostial calcific plaques superiorly. This causes as much as a 50% vessel origin stenosis. There is mild poststenotic ectasia without aneurysm, dissection or further stenosis. No branch occlusion is seen. SMA: There are ostial wall plaques, additional plaque just proximal to the inflection point, but no flow-limiting stenosis. No branch occlusion. Renals: 2 arteries supply each kidney. On the left there is a very small accessory artery to the upper pole, and a dominant artery arising just inferior to it. The accessory artery is widely patent. The dominant artery has nonstenosing calcific ostial plaques but is widely patent. On the right, the dominant artery arises first. There are ostial wall plaques anteriorly but they are nonstenosing. The smaller lower pole artery arises from the right anterior aortic wall 1.5 cm distal to this and is widely patent. There are no right renal artery branch occlusions, as far as visualized. IMA: Widely patent.  No branch occlusion. Inflow: There are bilateral heavily calcified common iliac arteries with again noted  thrombotic occlusion over most of the right common iliac artery and up to 80% calcific stenosis in mid left common iliac artery. Stenotic flow is visible in the distal right common iliac artery. 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 There are patchy calcifications in the internal iliac arteries without flow-limiting narrowing. The external iliac arteries are tortuous, both showing mild ostial plaques but neither demonstrating stenosis or further plaques. Proximal Outflow: Bilateral common femoral and visualized portions of the superficial and profunda femoral arteries are patent without evidence of aneurysm, dissection, vasculitis or significant stenosis. There are mild nonstenosing calcific plaques in the common femoral arteries. Veins: Patent. Review of the MIP images confirms the above findings. NON-VASCULAR Lower chest: The heart is normal slightly enlarged. There are calcifications in the mitral ring, right coronary artery.  The noncontrast imaging demonstrates low-density intraventricular blood pool compared to the myocardium consistent with anemia. Small hiatal hernia.  Visualized lung bases are clear. Hepatobiliary: 19 cm in length mildly steatotic liver. No mass enhancement. Old cholecystectomy. Stable mild biliary prominence. Pancreas: No abnormality. Spleen: No mass enhancement.  No splenomegaly. Adrenals/Urinary Tract: There is no adrenal mass. Both kidneys enhance homogeneously except for again noted 1.3 cm posterior lower pole right renal cyst with a Hounsfield density of 20. No follow-up imaging is recommended. There is no urinary stone or obstruction. There is trace air in the anterior bladder lumen which could be due to instrumentation, catheterization or infectious process, but there is no bladder thickening. Stomach/Bowel: No contrast extravasation is seen into the bowel. Mild thickened folds are again noted in the stomach and jejunum but without  inflammatory changes or small-bowel obstruction. An appendix is not seen in this patient. There are uncomplicated sigmoid diverticula. The large bowel wall is otherwise unremarkable except possibly at the anorectal junction where there may be eccentric wall thickening to the left and posteriorly. Direct visualization is recommended. Lymphatic: No appreciable adenopathy. Reproductive: Uterus and bilateral adnexa are unremarkable. Other: Small umbilical fat hernia. No incarcerated hernia. No free air, free fluid or free hemorrhage. Musculoskeletal: Osteopenia and degenerative change lumbar spine. There is advanced L4-5 facet hypertrophy with grade 1 spondylolisthesis and severe acquired spinal stenosis. Additional advanced facet hypertrophy at L5-S1 with grade 1 spondylolisthesis without significant spinal stenosis. There is thoracic spondylosis. No acute fractures. IMPRESSION: 1. Heavily calcified aorta with nonaneurysmal ectasia in the infrarenal segment to 2.2 cm. 2. 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 There is reconstitution in the distal right common iliac artery 3. 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 4. No other significant visceral artery stenoses. 5. No visible contrast extravasation into the bowel. 6. Possible eccentric wall thickening at the anorectal junction to the left and posteriorly. Direct visualization is recommended. 7. Trace air in the anterior bladder lumen which could be due to instrumentation, catheterization or infectious process, but there is no bladder thickening. 8. Mildly thickened folds in the stomach and jejunum but without inflammatory changes or small-bowel obstruction. Findings could be due to gastroenteritis or nondistention. 9. Mild hepatic steatosis. 10. Small hiatal hernia. 11. Umbilical fat hernia. 12. Osteopenia and degenerative change. 13. L4-5  severe acquired spinal stenosis. Electronically Signed   By: Almira Bar M.D.   On: 03/20/2023 00:30   DG Chest 2 View  Result Date: 03/20/2023 CLINICAL DATA:  Wheezing. EXAM: CHEST - 2 VIEW COMPARISON:  Chest radiograph 01/26/2023.  Chest CT 01/20/2023 FINDINGS: Chronic bronchial thickening. Right upper lobe nodular opacity in the periphery is more peripherally located than nodule on prior CT. No new airspace disease. No pleural fluid. The heart is normal in size. Aortic atherosclerosis. No pneumothorax. On limited assessment, no acute osseous findings. IMPRESSION: 1. Chronic bronchial thickening. 2. Right upper lobe nodular opacity is more peripherally located than nodule on prior CT. Unclear if this is the same nodule or a new nodular density. Electronically Signed   By: Narda Rutherford M.D.   On: 03/20/2023 00:01    Pending Labs Unresulted Labs (From admission, onward)     Start     Ordered   03/20/23 1000  CBC  Now then every 6 hours,   R (with TIMED occurrences)     Question:  Release  to patient  Answer:  Immediate   03/19/23 2222   Signed and Held  Magnesium  Tomorrow morning,   R        Signed and Held   Signed and Held  Phosphorus  Tomorrow morning,   R        Signed and Held   Signed and Held  CBC  Tomorrow morning,   R       Question:  Release to patient  Answer:  Immediate   Signed and Held            Vitals/Pain Today's Vitals   03/20/23 0902 03/20/23 1230 03/20/23 1237 03/20/23 1247  BP:  (!) 119/52    Pulse:  70    Resp:  18    Temp: (!) 97.4 F (36.3 C)   99.1 F (37.3 C)  TempSrc:    Oral  SpO2:  91%    Weight:      Height:      PainSc:   2      Isolation Precautions No active isolations  Medications Medications  pantoprozole (PROTONIX) 80 mg /NS 100 mL infusion (0 mg/hr Intravenous Stopped 03/20/23 0953)  pantoprazole (PROTONIX) injection 40 mg (has no administration in time range)  insulin aspart (novoLOG) injection 0-9 Units (1 Units Subcutaneous  Given 03/20/23 1206)  albuterol (PROVENTIL) (2.5 MG/3ML) 0.083% nebulizer solution 2.5 mg (has no administration in time range)  ALPRAZolam (XANAX) tablet 0.5 mg (has no administration in time range)  amiodarone (PACERONE) tablet 200 mg (200 mg Oral Given 03/20/23 0955)  atorvastatin (LIPITOR) tablet 20 mg (20 mg Oral Given 03/20/23 0955)  escitalopram (LEXAPRO) tablet 5 mg (has no administration in time range)  loratadine (CLARITIN) tablet 10 mg (10 mg Oral Given 03/20/23 0955)  0.9 %  sodium chloride infusion ( Intravenous New Bag/Given 03/20/23 0527)  HYDROcodone-acetaminophen (NORCO/VICODIN) 5-325 MG per tablet 1-2 tablet (1 tablet Oral Given 03/20/23 1206)  metoCLOPramide (REGLAN) injection 5 mg (has no administration in time range)  levothyroxine (SYNTHROID) tablet 125 mcg (125 mcg Oral Given 03/20/23 0527)  ALPRAZolam Prudy Feeler) tablet 0.5 mg (0.5 mg Oral Given 03/19/23 2035)  pantoprazole (PROTONIX) injection 40 mg (40 mg Intravenous Given 03/19/23 2054)  pantoprazole (PROTONIX) injection 40 mg (40 mg Intravenous Given 03/19/23 2235)  0.9 %  sodium chloride infusion (Manually program via Guardrails IV Fluids) (0 mLs Intravenous Stopped 03/20/23 0524)  iohexol (OMNIPAQUE) 350 MG/ML injection 100 mL (100 mLs Intravenous Contrast Given 03/19/23 2340)    Mobility walks     Focused Assessments Neuro Assessment Handoff:  Swallow screen pass?  N/A         Neuro Assessment: Within Defined Limits Neuro Checks:      Has TPA been given? No If patient is a Neuro Trauma and patient is going to OR before floor call report to 4N Charge nurse: [phone] or [phone]   R Recommendations: See Admitting Provider Note  Report given to:   Additional Notes:

## 2023-03-21 ENCOUNTER — Encounter (HOSPITAL_COMMUNITY)
Admission: EM | Disposition: A | Discharge status: Home / Self Care | Payer: Self-pay | Source: Home / Self Care | Attending: Internal Medicine

## 2023-03-21 ENCOUNTER — Inpatient Hospital Stay (HOSPITAL_COMMUNITY): Payer: Medicare PPO | Admitting: Anesthesiology

## 2023-03-21 ENCOUNTER — Encounter (HOSPITAL_COMMUNITY): Payer: Self-pay | Admitting: Internal Medicine

## 2023-03-21 ENCOUNTER — Encounter: Payer: Self-pay | Admitting: Hematology & Oncology

## 2023-03-21 DIAGNOSIS — K921 Melena: Secondary | ICD-10-CM | POA: Diagnosis not present

## 2023-03-21 DIAGNOSIS — I2510 Atherosclerotic heart disease of native coronary artery without angina pectoris: Secondary | ICD-10-CM

## 2023-03-21 DIAGNOSIS — K317 Polyp of stomach and duodenum: Secondary | ICD-10-CM

## 2023-03-21 DIAGNOSIS — K449 Diaphragmatic hernia without obstruction or gangrene: Secondary | ICD-10-CM | POA: Diagnosis not present

## 2023-03-21 DIAGNOSIS — K2970 Gastritis, unspecified, without bleeding: Secondary | ICD-10-CM | POA: Diagnosis not present

## 2023-03-21 DIAGNOSIS — K2950 Unspecified chronic gastritis without bleeding: Secondary | ICD-10-CM

## 2023-03-21 DIAGNOSIS — K31819 Angiodysplasia of stomach and duodenum without bleeding: Secondary | ICD-10-CM

## 2023-03-21 DIAGNOSIS — I10 Essential (primary) hypertension: Secondary | ICD-10-CM | POA: Diagnosis not present

## 2023-03-21 DIAGNOSIS — K922 Gastrointestinal hemorrhage, unspecified: Secondary | ICD-10-CM | POA: Diagnosis not present

## 2023-03-21 DIAGNOSIS — Z87891 Personal history of nicotine dependence: Secondary | ICD-10-CM

## 2023-03-21 HISTORY — PX: HOT HEMOSTASIS: SHX5433

## 2023-03-21 HISTORY — PX: ESOPHAGOGASTRODUODENOSCOPY (EGD) WITH PROPOFOL: SHX5813

## 2023-03-21 HISTORY — PX: BIOPSY: SHX5522

## 2023-03-21 LAB — CBC
HCT: 27.1 % — ABNORMAL LOW (ref 36.0–46.0)
HCT: 29.7 % — ABNORMAL LOW (ref 36.0–46.0)
Hemoglobin: 8.6 g/dL — ABNORMAL LOW (ref 12.0–15.0)
Hemoglobin: 9.4 g/dL — ABNORMAL LOW (ref 12.0–15.0)
MCH: 32 pg (ref 26.0–34.0)
MCH: 32 pg (ref 26.0–34.0)
MCHC: 31.7 g/dL (ref 30.0–36.0)
MCHC: 31.6 g/dL (ref 30.0–36.0)
MCV: 100.7 fL — ABNORMAL HIGH (ref 80.0–100.0)
MCV: 101 fL — ABNORMAL HIGH (ref 80.0–100.0)
Platelets: 225 10*3/uL (ref 150–400)
Platelets: 242 10*3/uL (ref 150–400)
RBC: 2.69 MIL/uL — ABNORMAL LOW (ref 3.87–5.11)
RBC: 2.94 MIL/uL — ABNORMAL LOW (ref 3.87–5.11)
RDW: 23.8 % — ABNORMAL HIGH (ref 11.5–15.5)
RDW: 24 % — ABNORMAL HIGH (ref 11.5–15.5)
WBC: 5.1 10*3/uL (ref 4.0–10.5)
WBC: 5.6 10*3/uL (ref 4.0–10.5)
nRBC: 0 % (ref 0.0–0.2)
nRBC: 0.4 % — ABNORMAL HIGH (ref 0.0–0.2)

## 2023-03-21 LAB — TYPE AND SCREEN

## 2023-03-21 LAB — GLUCOSE, CAPILLARY
Glucose-Capillary: 120 mg/dL — ABNORMAL HIGH (ref 70–99)
Glucose-Capillary: 131 mg/dL — ABNORMAL HIGH (ref 70–99)
Glucose-Capillary: 125 mg/dL — ABNORMAL HIGH (ref 70–99)
Glucose-Capillary: 104 mg/dL — ABNORMAL HIGH (ref 70–99)
Glucose-Capillary: 133 mg/dL — ABNORMAL HIGH (ref 70–99)

## 2023-03-21 LAB — MAGNESIUM: Magnesium: 2.2 mg/dL (ref 1.7–2.4)

## 2023-03-21 LAB — BPAM RBC: ISSUE DATE / TIME: 202407090311

## 2023-03-21 LAB — PHOSPHORUS: Phosphorus: 4.1 mg/dL (ref 2.5–4.6)

## 2023-03-21 SURGERY — ESOPHAGOGASTRODUODENOSCOPY (EGD) WITH PROPOFOL
Anesthesia: Monitor Anesthesia Care

## 2023-03-21 MED ORDER — PROPOFOL 500 MG/50ML IV EMUL
INTRAVENOUS | Status: DC | PRN
  Administered 2023-03-21: 75 ug/kg/min via INTRAVENOUS

## 2023-03-21 MED ORDER — PROPOFOL 10 MG/ML IV BOLUS
INTRAVENOUS | Status: DC | PRN
  Administered 2023-03-21: 50 mg via INTRAVENOUS

## 2023-03-21 MED ORDER — PROPOFOL 500 MG/50ML IV EMUL
INTRAVENOUS | Status: AC
  Filled 2023-03-21: qty 50

## 2023-03-21 MED ORDER — LIDOCAINE 2% (20 MG/ML) 5 ML SYRINGE
INTRAMUSCULAR | Status: DC | PRN
  Administered 2023-03-21: 80 mg via INTRAVENOUS

## 2023-03-21 MED ORDER — FLEET ENEMA 7-19 GM/118ML RE ENEM
1.0000 | ENEMA | RECTAL | Status: DC
  Administered 2023-03-22: 1 via RECTAL
  Filled 2023-03-21: qty 1

## 2023-03-21 MED ORDER — LACTATED RINGERS IV SOLN: INTRAVENOUS | Status: DC | PRN

## 2023-03-21 MED ORDER — ONDANSETRON HCL 4 MG/2ML IJ SOLN
INTRAMUSCULAR | Status: DC | PRN
  Administered 2023-03-21: 4 mg via INTRAVENOUS

## 2023-03-21 SURGICAL SUPPLY — 15 items

## 2023-03-21 NOTE — Anesthesia Procedure Notes (Signed)
Procedure Name: MAC Date/Time: 03/21/2023 2:19 PM  Performed by: Ludwig Lean, CRNAPre-anesthesia Checklist: Patient identified, Emergency Drugs available, Suction available and Patient being monitored Patient Re-evaluated:Patient Re-evaluated prior to induction Oxygen Delivery Method: Simple face mask Placement Confirmation: positive ETCO2 and breath sounds checked- equal and bilateral

## 2023-03-21 NOTE — Anesthesia Preprocedure Evaluation (Signed)
Anesthesia Evaluation  Patient identified by MRN, date of birth, ID band Patient awake    Reviewed: Allergy & Precautions, H&P , NPO status , Patient's Chart, lab work & pertinent test results  Airway Mallampati: II   Neck ROM: full    Dental   Pulmonary former smoker   breath sounds clear to auscultation       Cardiovascular hypertension, + CAD   Rhythm:regular Rate:Normal     Neuro/Psych   Anxiety     CVA    GI/Hepatic GI bleeding   Endo/Other  diabetes, Type 2Hypothyroidism    Renal/GU      Musculoskeletal  (+) Arthritis ,    Abdominal   Peds  Hematology  (+) Blood dyscrasia, anemia Hemoglobin 9.4   Anesthesia Other Findings   Reproductive/Obstetrics                             Anesthesia Physical Anesthesia Plan  ASA: 3  Anesthesia Plan: MAC   Post-op Pain Management:    Induction: Intravenous  PONV Risk Score and Plan: 2 and Propofol infusion and Treatment may vary due to age or medical condition  Airway Management Planned: Nasal Cannula  Additional Equipment:   Intra-op Plan:   Post-operative Plan:   Informed Consent: I have reviewed the patients History and Physical, chart, labs and discussed the procedure including the risks, benefits and alternatives for the proposed anesthesia with the patient or authorized representative who has indicated his/her understanding and acceptance.     Dental advisory given  Plan Discussed with: CRNA, Anesthesiologist and Surgeon  Anesthesia Plan Comments:        Anesthesia Quick Evaluation

## 2023-03-21 NOTE — Op Note (Addendum)
[facility] Patient Name: [name] Procedure Date: 03/21/2023 MRN: [id number] Attending MD: Lynann Bologna , MD, [id number] Date of Birth: [birth year] CSN: [id number] Age: 89 Admit Type: Inpatient Procedure:                Upper GI endoscopy Indications:              Melena with ABL anemia with drop in Hb 11 (2 weeks                            ago) to 8 s/p 1U to 8.7. Elevated BUN. Neg CTA. No                            NSAIDs. Prior duodenal AVM s/p APC 2019                           Recently Dx rectal Ca with possible lung mets/Lynch                            on Xeloda/Keytruda (Dx on FS 01/2023)                           Afib on eliquis (last dose 7/8) Providers:                Lynann Bologna, MD, Adin Hector, RN, Fransisca Connors, Faustina Mbumina, Technician Referring MD:              Medicines:                Monitored Anesthesia Care Complications:            No immediate complications. Estimated Blood Loss:     Estimated blood loss: none. Procedure:                Pre-Anesthesia Assessment:                           - Prior to the procedure, a History and Physical                            was performed, and patient medications and                            allergies were reviewed. The patient's tolerance of                            previous anesthesia was also reviewed. The risks                            and benefits of the procedure and the sedation                            options and risks were discussed with the patient.  All questions were answered, and informed consent                            was obtained. Prior Anticoagulants: Eliquis was                            held 2 days prior. ASA Grade Assessment: III - A                            patient with severe systemic disease. After                            reviewing the risks and benefits, the patient was                             deemed in satisfactory condition to undergo the                            procedure.                           After obtaining informed consent, the endoscope was                            passed under direct vision. Throughout the                            procedure, the patient's blood pressure, pulse, and                            oxygen saturations were monitored continuously. The                            [id number]-H190 ([id number]) Olympus endoscope was introduced                            through the mouth, and advanced to the second part                            of duodenum. The upper GI endoscopy was                            accomplished without difficulty. The patient                            tolerated the procedure well. Scope In: Scope Out: Findings:      The examined esophagus was normal.      A small hiatal hernia was present.      Localized moderate inflammation characterized by erythema was found in       the gastric antrum. Biopsies were taken with a cold forceps for       histology.      A few 2 to 3 mm sessile polyps with no bleeding and no stigmata of       recent bleeding were  found in the gastric fundus and in the gastric body.      Three 2 to 3 mm angiodysplastic lesions without bleeding were found in       the second portion of the duodenum. Fulguration to ablate the lesion by       argon plasma at 0.4 liters/minute and 15 watts was successful.      The exam was otherwise without abnormality. Impression:               - Small hiatal hernia.                           - Gastritis. Biopsied.                           - A few benign gastric polyps.                           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 Treated with argon plasma coagulation                            (APC).                           - The examination was otherwise normal.                           - No active bleeding. Moderate  Sedation:      Not Applicable - Patient had care per Anesthesia. Recommendation:           - Patient has a contact number available for                            emergencies. The signs and symptoms of potential                            delayed complications were discussed with the                            patient. Return to normal activities tomorrow.                            Written discharge instructions were provided to the                            patient.                           - Resume previous diet.                           - Continue Protonix 40 mg p.o. daily.                           - Trend CBC.                           -  Await pathology results.                           - Resume Eliquis 7/12                           - The findings and recommendations were discussed                            with the patient's family. If any further bleeding,                            recommend colonoscopy.                           Addendum-discussed extensively with pt's family and                            reviewed Dr. Gustavo Lah note. Would proceed with                            flexible sigmoidoscopy tomorrow to assess the tumor. Procedure Code(s):        --- Professional ---                           [phone], Esophagogastroduodenoscopy, flexible,                            transoral; with ablation of tumor(s), polyp(s), or                            other lesion(s) (includes pre- and post-dilation                            and guide wire passage, when performed)                           43239, 59, Esophagogastroduodenoscopy, flexible,                            transoral; with biopsy, single or multiple Diagnosis Code(s):        --- Professional ---                           K44.9, Diaphragmatic hernia without obstruction or                            gangrene                           K29.70, Gastritis, unspecified, without bleeding                           K31.7,  Polyp of stomach and duodenum                           K31.819, Angiodysplasia of stomach  and duodenum                            without bleeding                           K92.1, Melena (includes Hematochezia) CPT copyright 2022 [employer]. All rights reserved. The codes documented in this report are preliminary and upon coder review may  be revised to meet current compliance requirements. Lynann Bologna, MD 03/21/2023 2:53:58 PM This report has been signed electronically. Number of Addenda: 0

## 2023-03-21 NOTE — Anesthesia Preprocedure Evaluation (Signed)
Anesthesia Evaluation  Patient identified by MRN, date of birth, ID band Patient awake    Reviewed: Allergy & Precautions, H&P , NPO status , Patient's Chart, lab work & pertinent test results  Airway Mallampati: II   Neck ROM: full    Dental  (+) Edentulous Upper, Edentulous Lower   Pulmonary former smoker   Pulmonary exam normal        Cardiovascular hypertension, + CAD   Rhythm:Irregular Rate:Normal     Neuro/Psych   Anxiety     CVA    GI/Hepatic GI bleeding   Endo/Other  diabetes, Type 2Hypothyroidism    Renal/GU      Musculoskeletal  (+) Arthritis ,    Abdominal   Peds  Hematology  (+) Blood dyscrasia, anemia Hemoglobin 9.4   Anesthesia Other Findings All: Prednisone and Aggrenox  Reproductive/Obstetrics                             Anesthesia Physical Anesthesia Plan  ASA: 3  Anesthesia Plan: MAC   Post-op Pain Management: Minimal or no pain anticipated   Induction: Intravenous  PONV Risk Score and Plan: 2 and Treatment may vary due to age or medical condition and Propofol infusion  Airway Management Planned: Natural Airway and Nasal Cannula  Additional Equipment: None  Intra-op Plan:   Post-operative Plan:   Informed Consent: I have reviewed the patients History and Physical, chart, labs and discussed the procedure including the risks, benefits and alternatives for the proposed anesthesia with the patient or authorized representative who has indicated his/her understanding and acceptance.     Dental advisory given  Plan Discussed with: CRNA, Anesthesiologist and Surgeon  Anesthesia Plan Comments: (Flex sig for hx of Melena and rectal CA)       Anesthesia Quick Evaluation

## 2023-03-21 NOTE — Progress Notes (Signed)
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  She reported lightheadedness.  She was evaluated at her oncologist office and she had very small external hemorrhoid, ocular blood was positive. Hb 8.7   Assessment & Plan:   Principal Problem:   Upper GI bleed Active Problems:   Symptomatic anemia   Hyperlipidemia   Hypothyroidism   Diabetes mellitus without complication (HCC)   HTN (hypertension)   Rectal cancer (HCC)   Paroxysmal atrial fibrillation (HCC)   Wheezing   GI bleed  1-GI Bleed: presents with melena.  Received one unit PRBC.  GI consulted.  On Protonix Gtt.  Plan for endoscopy today 7/10 CTA; negative for active bleeding.   2-Symptomatic Anemia:  Received one unit PRBC.  Monitor hb.  Stable hb at 9  Hyperlipidemia: Continue with Lipitor  Diabetes : Continue with a sliding scale insulin  HTN: Hold blood pressure medication  Hypothyroidism: Continue with Synthroid  Rectal cancer:  Follow-up with Dr. Myna Hidalgo.  PAF; continue to hold Eliquis in the setting of GI bleed Continue with amiodarone  Respiratory System: Wheezing.  As needed nebulizer. Chest x ray; chronic bronchial thickening.       Estimated body mass index is 29.22 kg/m as calculated from the following:   Height as of this encounter: 5' (1.524 m).   Weight as of this encounter: 67.9 kg.   DVT prophylaxis: SCD Code Status: Full code Family Communication: daughter at bedside.  Disposition Plan:  Status is: Inpatient Remains inpatient appropriate because: management of GI bleed.     Consultants:  GI Dr  Myna Hidalgo  Procedures:    Antimicrobials:    Subjective: Had small bloody stool today. Breathing ok. She is nervous for procedure. .   Objective: Vitals:   03/20/23 1941 03/20/23 2340 03/21/23 0320 03/21/23 1300  BP: (!) 142/52 (!) 118/49 (!) 143/54 (!) 150/60  Pulse: 62 (!) 59 71 79  Resp: 16  14 13   Temp: (!) 97.5 F (36.4 C) 97.6 F (36.4 C) (!) 97.5 F (36.4 C) 98.1 F (36.7 C)  TempSrc: Oral Oral Oral Temporal  SpO2: 94% 92% 92% (!) 89%  Weight:      Height:        Intake/Output Summary (Last 24 hours) at 03/21/2023 1454 Last data filed at 03/20/2023 1600 Gross per 24 hour  Intake 791.25 ml  Output --  Net 791.25 ml    Filed Weights   03/19/23 1655  Weight: 67.9 kg    Examination:  General exam: NAD Respiratory system: CTA Cardiovascular system: S 1, S 2 RRR Gastrointestinal system: BS present, soft nt Central nervous system:alert Extremities: no edema   Data Reviewed: I have personally reviewed following labs and imaging studies  CBC: Recent Labs  Lab 03/19/23 1516 03/19/23 2125 03/20/23 0530 03/20/23 0958 03/20/23 1557 03/21/23 0131 03/21/23 1043  WBC 7.0   < > 5.0 6.2 6.8 5.1 5.6  NEUTROABS 3.7  --   --   --   --   --   --   HGB 8.2*   < > 8.7* 9.2* 10.0* 8.6* 9.4*  HCT 26.0*   < > 26.6* 27.4* 31.1* 27.1* 29.7*  MCV 100.0   < >  96.7 97.5 100.0 100.7* 101.0*  PLT 247   < > 201 214 214 225 242   < > = values in this interval not displayed.    Basic Metabolic Panel: Recent Labs  Lab 03/19/23 1516 03/19/23 2030 03/20/23 0530 03/21/23 0131  NA 134*  --  133*  --   K 4.0  --  3.7  --   CL 97*  --  98  --   CO2 28  --  26  --   GLUCOSE 165*  --  116*  --   BUN 24*  --  19  --   CREATININE 0.66  --  0.62  --   CALCIUM 9.5  --  8.4*  --   MG  --  2.2  --  2.2  PHOS  --  4.2  --  4.1    GFR: Estimated Creatinine Clearance: 39.4 mL/min (by C-G formula based on SCr of 0.62 mg/dL). Liver Function Tests: Recent Labs  Lab  03/19/23 1516 03/20/23 0530  AST 39 36  ALT 52* 50*  ALKPHOS 64 47  BILITOT 0.8 0.9  PROT 6.7 5.6*  ALBUMIN 4.0 3.1*    No results for input(s): "LIPASE", "AMYLASE" in the last 168 hours. No results for input(s): "AMMONIA" in the last 168 hours. Coagulation Profile: No results for input(s): "INR", "PROTIME" in the last 168 hours. Cardiac Enzymes: Recent Labs  Lab 03/19/23 2030  CKTOTAL 48    BNP (last 3 results) No results for input(s): "PROBNP" in the last 8760 hours. HbA1C: No results for input(s): "HGBA1C" in the last 72 hours. CBG: Recent Labs  Lab 03/20/23 1650 03/20/23 2009 03/21/23 0319 03/21/23 0740 03/21/23 1131  GLUCAP 124* 155* 120* 131* 125*    Lipid Profile: No results for input(s): "CHOL", "HDL", "LDLCALC", "TRIG", "CHOLHDL", "LDLDIRECT" in the last 72 hours. Thyroid Function Tests: Recent Labs    03/20/23 1031  TSH 5.468*   Anemia Panel: Recent Labs    03/19/23 1516 03/19/23 1529  FERRITIN  --  57  TIBC 389  --   IRON 66  --     Sepsis Labs: No results for input(s): "PROCALCITON", "LATICACIDVEN" in the last 168 hours.  No results found for this or any previous visit (from the past 240 hour(s)).       Radiology Studies: CT ANGIO GI BLEED  Result Date: 03/20/2023 CLINICAL DATA:  Patient with history of colorectal cancer, had a positive occult fecal blood test at the oncologists office today. Being admitted for further workup. Hemoglobin was 8.2. EXAM: CTA ABDOMEN AND PELVIS WITHOUT AND WITH CONTRAST TECHNIQUE: Multidetector CT imaging of the abdomen and pelvis was performed using the standard protocol during bolus administration of intravenous contrast. Multiplanar reconstructed images and MIPs were obtained and reviewed to evaluate the vascular anatomy. RADIATION DOSE REDUCTION: This exam was performed according to the departmental dose-optimization program which includes automated exposure control, adjustment of the mA and/or kV  according to patient size and/or use of iterative reconstruction technique. CONTRAST:  OMNIPAQUE IOHEXOL 350 MG/ML SOLN COMPARISON:  CT abdomen pelvis with IV contrast 01/17/2023, CTA chest 01/20/2023. FINDINGS: VASCULAR Aorta: Heavily calcified. Nonaneurysmal fusiform infrarenal ectasia seen to 2.2 cm. No critical stenosis in the aorta. Celiac: There are ostial calcific plaques superiorly. This causes as much as a 50% vessel origin stenosis. There is mild poststenotic ectasia without aneurysm, dissection or further stenosis. No branch occlusion is seen. SMA: There are ostial wall plaques, additional plaque just proximal to  the inflection point, but no flow-limiting stenosis. No branch occlusion. Renals: 2 arteries supply each kidney. On the left there is a very small accessory artery to the upper pole, and a dominant artery arising just inferior to it. The accessory artery is widely patent. The dominant artery has nonstenosing calcific ostial plaques but is widely patent. On the right, the dominant artery arises first. There are ostial wall plaques anteriorly but they are nonstenosing. The smaller lower pole artery arises from the right anterior aortic wall 1.5 cm distal to this and is widely patent. There are no right renal artery branch occlusions, as far as visualized. IMA: Widely patent.  No branch occlusion. Inflow: There are bilateral heavily calcified common iliac arteries with again noted thrombotic occlusion over most of the right common iliac artery and up to 80% calcific stenosis in mid left common iliac artery. Stenotic flow is visible in the distal right common iliac artery. 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 There are patchy calcifications in the internal iliac arteries without flow-limiting narrowing. The external iliac arteries are tortuous, both showing mild ostial plaques but neither demonstrating stenosis or further plaques. Proximal  Outflow: Bilateral common femoral and visualized portions of the superficial and profunda femoral arteries are patent without evidence of aneurysm, dissection, vasculitis or significant stenosis. There are mild nonstenosing calcific plaques in the common femoral arteries. Veins: Patent. Review of the MIP images confirms the above findings. NON-VASCULAR Lower chest: The heart is normal slightly enlarged. There are calcifications in the mitral ring, right coronary artery. The noncontrast imaging demonstrates low-density intraventricular blood pool compared to the myocardium consistent with anemia. Small hiatal hernia.  Visualized lung bases are clear. Hepatobiliary: 19 cm in length mildly steatotic liver. No mass enhancement. Old cholecystectomy. Stable mild biliary prominence. Pancreas: No abnormality. Spleen: No mass enhancement.  No splenomegaly. Adrenals/Urinary Tract: There is no adrenal mass. Both kidneys enhance homogeneously except for again noted 1.3 cm posterior lower pole right renal cyst with a Hounsfield density of 20. No follow-up imaging is recommended. There is no urinary stone or obstruction. There is trace air in the anterior bladder lumen which could be due to instrumentation, catheterization or infectious process, but there is no bladder thickening. Stomach/Bowel: No contrast extravasation is seen into the bowel. Mild thickened folds are again noted in the stomach and jejunum but without inflammatory changes or small-bowel obstruction. An appendix is not seen in this patient. There are uncomplicated sigmoid diverticula. The large bowel wall is otherwise unremarkable except possibly at the anorectal junction where there may be eccentric wall thickening to the left and posteriorly. Direct visualization is recommended. Lymphatic: No appreciable adenopathy. Reproductive: Uterus and bilateral adnexa are unremarkable. Other: Small umbilical fat hernia. No incarcerated hernia. No free air, free fluid or  free hemorrhage. Musculoskeletal: Osteopenia and degenerative change lumbar spine. There is advanced L4-5 facet hypertrophy with grade 1 spondylolisthesis and severe acquired spinal stenosis. Additional advanced facet hypertrophy at L5-S1 with grade 1 spondylolisthesis without significant spinal stenosis. There is thoracic spondylosis. No acute fractures. IMPRESSION: 1. Heavily calcified aorta with nonaneurysmal ectasia in the infrarenal segment to 2.2 cm. 2. 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 There is reconstitution in the distal right common iliac artery 3. 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 4. No other significant visceral artery stenoses. 5.  No visible contrast extravasation into the bowel. 6. Possible eccentric wall thickening at the anorectal junction to the left and posteriorly. Direct visualization is recommended. 7. Trace air in the anterior bladder lumen which could be due to instrumentation, catheterization or infectious process, but there is no bladder thickening. 8. Mildly thickened folds in the stomach and jejunum but without inflammatory changes or small-bowel obstruction. Findings could be due to gastroenteritis or nondistention. 9. Mild hepatic steatosis. 10. Small hiatal hernia. 11. Umbilical fat hernia. 12. Osteopenia and degenerative change. 13. L4-5 severe acquired spinal stenosis. Electronically Signed   By: Almira Bar M.D.   On: 03/20/2023 00:30   DG Chest 2 View  Result Date: 03/20/2023 CLINICAL DATA:  Wheezing. EXAM: CHEST - 2 VIEW COMPARISON:  Chest radiograph 01/26/2023.  Chest CT 01/20/2023 FINDINGS: Chronic bronchial thickening. Right upper lobe nodular opacity in the periphery is more peripherally located than nodule on prior CT. No new airspace disease. No pleural fluid. The heart is normal in size. Aortic atherosclerosis. No pneumothorax. On  limited assessment, no acute osseous findings. IMPRESSION: 1. Chronic bronchial thickening. 2. Right upper lobe nodular opacity is more peripherally located than nodule on prior CT. Unclear if this is the same nodule or a new nodular density. Electronically Signed   By: Narda Rutherford M.D.   On: 03/20/2023 00:01        Scheduled Meds:  [MAR Hold] amiodarone  200 mg Oral Daily   [MAR Hold] atorvastatin  20 mg Oral Daily   [MAR Hold] escitalopram  5 mg Oral QHS   [MAR Hold] insulin aspart  0-9 Units Subcutaneous Q4H   [MAR Hold] levothyroxine  125 mcg Oral Q0600   [MAR Hold] loratadine  10 mg Oral Daily   [MAR Hold] pantoprazole  40 mg Intravenous Q12H   Continuous Infusions:  sodium chloride 20 mL/hr at 03/21/23 0616   pantoprazole Stopped (03/20/23 0953)     LOS: 2 days    Time spent: 35 minutes    Jatoya Armbrister A Nikola Blackston, MD [facility]   If 7PM-7AM, please contact night-coverage [email or web address]  03/21/2023, 2:54 PM

## 2023-03-21 NOTE — Interval H&P Note (Signed)
History and Physical Interval Note:  03/21/2023 2:10 PM  [name]  has presented today for surgery, with the diagnosis of Upper GI Bleed.  The various methods of treatment have been discussed with the patient and family. After consideration of risks, benefits and other options for treatment, the patient has consented to  Procedure(s): ESOPHAGOGASTRODUODENOSCOPY (EGD) WITH PROPOFOL (N/A) as a surgical intervention.  The patient's history has been reviewed, patient examined, no change in status, stable for surgery.  I have reviewed the patient's chart and labs.  Questions were answered to the patient's satisfaction.     Lynann Bologna

## 2023-03-21 NOTE — Transfer of Care (Signed)
Immediate Anesthesia Transfer of Care Note  Patient: [name]  Procedure(s) Performed: Procedure(s): ESOPHAGOGASTRODUODENOSCOPY (EGD) WITH PROPOFOL (N/A)  Patient Location: PACU  Anesthesia Type:MAC  Level of Consciousness: Patient easily awoken, sedated, comfortable, cooperative, following commands, responds to stimulation.   Airway & Oxygen Therapy: Patient spontaneously breathing, ventilating well, oxygen via simple oxygen mask.  Post-op Assessment: Report given to PACU RN, vital signs reviewed and stable, moving all extremities.   Post vital signs: Reviewed and stable.  Complications: No apparent anesthesia complications  Last Vitals:  Vitals Value Taken Time  BP 128/49 03/21/23 1449  Temp    Pulse 69 03/21/23 1449  Resp 9 03/21/23 1449  SpO2 100 03/21/23 1449    Last Pain:  Vitals:   03/21/23 1300  TempSrc: Temporal  PainSc: 0-No pain      Patients Stated Pain Goal: 2 (03/20/23 2000)  Complications: No notable events documented.

## 2023-03-21 NOTE — Progress Notes (Signed)
[name] is now on 6 E.  She has gone to have an upper endoscopy today.  She says that she has not had any obvious bleeding.  Then again, she has not had a bowel movement yet.  She has had 1 unit of blood.  Her hemoglobin has will not come of that much.  Her white count is 5.1.  Hemoglobin 8.6.  Platelet count 225,000.  I suspect that she probably will need to have another unit of blood.  She does not complain of any pain.  Patient has little bit of wheezing.  She had a chest x-ray on admission which shows some bronchial thickening.  The nodule in the right upper lobe was noted.  She has not eaten that much since she came to the hospital.  She has had no nausea or vomiting.  I am unsure how much activity she has had while she has been in the hospital.  She is off the Eliquis.  I think the question is whether or not she needs to go back onto Eliquis.  I think this is some that cardiology may have to answer given that she has had atrial fibrillation.  Her vital signs are stable.  Temperature 97.5.  Pulse 71.  Blood pressure 143/54.  Her lungs sound a little wheezy.  She has does have decent air movement bilaterally.  Cardiac exam regular rate and rhythm.  I really cannot detect atrial fibrillation.  Abdomen is soft.  Bowel sounds are present.  There is no fluid wave.  There is no palpable liver or spleen tip.  There is no guarding or rebound tenderness.  Extremities shows no clubbing, cyanosis or edema.  Neurological exam shows no focal neurological deficits.   [name] was admitted with GI bleeding.  She was quite anemic.  She has a rectal adenocarcinoma.  Patient cannot have an upper endoscopy today.  We will see with what this shows.  If this is negative, I would think that she would then need to have at least a flexible sigmoidoscopy to to take a look at the tumor itself.  I suspect she also probably will need to have another transfusion.  Hemoglobin is on the low side.  I know that she  will get incredible care from everybody up on 6 E.  I appreciate everybody's help.   [name], MD  Psalm 31:14

## 2023-03-22 ENCOUNTER — Inpatient Hospital Stay (HOSPITAL_COMMUNITY): Payer: Medicare PPO | Admitting: Anesthesiology

## 2023-03-22 ENCOUNTER — Ambulatory Visit: Payer: Medicare PPO | Admitting: Hematology & Oncology

## 2023-03-22 ENCOUNTER — Inpatient Hospital Stay: Payer: Medicare PPO

## 2023-03-22 ENCOUNTER — Encounter (HOSPITAL_COMMUNITY)
Admission: EM | Disposition: A | Discharge status: Home / Self Care | Payer: Self-pay | Source: Home / Self Care | Attending: Internal Medicine

## 2023-03-22 ENCOUNTER — Encounter (HOSPITAL_COMMUNITY): Payer: Self-pay | Admitting: Gastroenterology

## 2023-03-22 ENCOUNTER — Other Ambulatory Visit: Payer: Medicare PPO

## 2023-03-22 ENCOUNTER — Encounter: Payer: Self-pay | Admitting: *Deleted

## 2023-03-22 ENCOUNTER — Encounter: Payer: Self-pay | Admitting: Hematology & Oncology

## 2023-03-22 DIAGNOSIS — C20 Malignant neoplasm of rectum: Secondary | ICD-10-CM

## 2023-03-22 DIAGNOSIS — K5730 Diverticulosis of large intestine without perforation or abscess without bleeding: Secondary | ICD-10-CM | POA: Diagnosis not present

## 2023-03-22 DIAGNOSIS — I2510 Atherosclerotic heart disease of native coronary artery without angina pectoris: Secondary | ICD-10-CM | POA: Diagnosis not present

## 2023-03-22 DIAGNOSIS — K921 Melena: Secondary | ICD-10-CM | POA: Diagnosis not present

## 2023-03-22 DIAGNOSIS — K922 Gastrointestinal hemorrhage, unspecified: Secondary | ICD-10-CM | POA: Diagnosis not present

## 2023-03-22 DIAGNOSIS — I10 Essential (primary) hypertension: Secondary | ICD-10-CM | POA: Diagnosis not present

## 2023-03-22 HISTORY — PX: BIOPSY: SHX5522

## 2023-03-22 HISTORY — PX: FLEXIBLE SIGMOIDOSCOPY: SHX5431

## 2023-03-22 LAB — GLUCOSE, CAPILLARY
Glucose-Capillary: 104 mg/dL — ABNORMAL HIGH (ref 70–99)
Glucose-Capillary: 112 mg/dL — ABNORMAL HIGH (ref 70–99)
Glucose-Capillary: 114 mg/dL — ABNORMAL HIGH (ref 70–99)
Glucose-Capillary: 134 mg/dL — ABNORMAL HIGH (ref 70–99)
Glucose-Capillary: 173 mg/dL — ABNORMAL HIGH (ref 70–99)
Glucose-Capillary: 94 mg/dL (ref 70–99)
Glucose-Capillary: 96 mg/dL (ref 70–99)

## 2023-03-22 LAB — BASIC METABOLIC PANEL
Anion gap: 6 (ref 5–15)
BUN: 13 mg/dL (ref 8–23)
CO2: 26 mmol/L (ref 22–32)
Calcium: 8 mg/dL — ABNORMAL LOW (ref 8.9–10.3)
Chloride: 98 mmol/L (ref 98–111)
Creatinine, Ser: 0.74 mg/dL (ref 0.44–1.00)
GFR, Estimated: >60 mL/min (ref >60)
Glucose, Bld: 101 mg/dL — ABNORMAL HIGH (ref 70–99)
Potassium: 4.2 mmol/L (ref 3.5–5.1)
Sodium: 130 mmol/L — ABNORMAL LOW (ref 135–145)

## 2023-03-22 LAB — CBC
HCT: 26.6 % — ABNORMAL LOW (ref 36.0–46.0)
Hemoglobin: 8.5 g/dL — ABNORMAL LOW (ref 12.0–15.0)
MCH: 32.1 pg (ref 26.0–34.0)
MCHC: 32 g/dL (ref 30.0–36.0)
MCV: 100.4 fL — ABNORMAL HIGH (ref 80.0–100.0)
Platelets: 197 10*3/uL (ref 150–400)
RBC: 2.65 MIL/uL — ABNORMAL LOW (ref 3.87–5.11)
RDW: 24.2 % — ABNORMAL HIGH (ref 11.5–15.5)
WBC: 5.3 10*3/uL (ref 4.0–10.5)
nRBC: 0.4 % — ABNORMAL HIGH (ref 0.0–0.2)

## 2023-03-22 SURGERY — SIGMOIDOSCOPY, FLEXIBLE
Anesthesia: Monitor Anesthesia Care

## 2023-03-22 MED ORDER — PANTOPRAZOLE SODIUM 40 MG PO TBEC
40.0000 mg | DELAYED_RELEASE_TABLET | Freq: Every day | ORAL | Status: DC
  Administered 2023-03-22 – 2023-03-25 (×4): 40 mg via ORAL
  Filled 2023-03-22 (×4): qty 1

## 2023-03-22 MED ORDER — ALBUTEROL SULFATE (2.5 MG/3ML) 0.083% IN NEBU
INHALATION_SOLUTION | RESPIRATORY_TRACT | Status: AC
  Filled 2023-03-22: qty 3

## 2023-03-22 MED ORDER — PHENYLEPHRINE 80 MCG/ML (10ML) SYRINGE FOR IV PUSH (FOR BLOOD PRESSURE SUPPORT)
PREFILLED_SYRINGE | INTRAVENOUS | Status: DC | PRN
  Administered 2023-03-22 (×2): 120 ug via INTRAVENOUS

## 2023-03-22 MED ORDER — PROPOFOL 500 MG/50ML IV EMUL
INTRAVENOUS | Status: DC | PRN
  Administered 2023-03-22: 125 ug/kg/min via INTRAVENOUS

## 2023-03-22 MED ORDER — LACTATED RINGERS IV SOLN: INTRAVENOUS | Status: DC | PRN

## 2023-03-22 MED ORDER — EPHEDRINE SULFATE-NACL 50-0.9 MG/10ML-% IV SOSY
PREFILLED_SYRINGE | INTRAVENOUS | Status: DC | PRN
  Administered 2023-03-22: 5 mg via INTRAVENOUS

## 2023-03-22 MED ORDER — PROPOFOL 500 MG/50ML IV EMUL
INTRAVENOUS | Status: AC
  Filled 2023-03-22: qty 50

## 2023-03-22 MED ORDER — PROPOFOL 10 MG/ML IV BOLUS
INTRAVENOUS | Status: DC | PRN
  Administered 2023-03-22: 20 mg via INTRAVENOUS

## 2023-03-22 NOTE — Progress Notes (Addendum)
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  She reported lightheadedness.  She was evaluated at her oncologist office and she had very small external hemorrhoid, ocular blood was positive. Hb 8.7   Assessment & Plan:   Principal Problem:   Upper GI bleed Active Problems:   Symptomatic anemia   Hyperlipidemia   Hypothyroidism   Diabetes mellitus without complication (HCC)   HTN (hypertension)   Rectal cancer (HCC)   Paroxysmal atrial fibrillation (HCC)   Wheezing   GI bleed  1-GI Bleed: presents with melena. In setting of rectal cancer, mass and eliquis.  Received one unit PRBC.  GI consulted.  On Protonix  Underwent Endoscopy  7/10: Small hiatal hernia. Gastritis. Biopsied. A few benign gastric polyps. 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 Treated with argon plasma coagulation (APC). CTA; negative for active bleeding.  Underwent sigmoidoscopy 7/11: Showed distal rectal mass the size appears to be smaller than the recent sigmoidoscopy/2024.  However very friable mass and would  bleed easily. GI recommends to hold off on Eliquis if cardiology and Dr. Myna Hidalgo agrees. Dr Myna Hidalgo agrees with holding Eliquis.  Monitor hb.  Discussed with cardiology Dr Rennis Golden, ok to hold Eliquis   2-Symptomatic Anemia:  Received one unit PRBC.  Monitor hb. Down to 8.5  Hyperlipidemia: Continue with Lipitor  Diabetes : Continue with a sliding scale insulin  HTN: Hold blood pressure medication  Hypothyroidism: Continue with Synthroid  Rectal cancer:  Follow-up with Dr. Myna Hidalgo.  PAF; continue  to hold Eliquis in the setting of GI bleed Continue with amiodarone  Respiratory System: Wheezing.  As needed nebulizer. Chest x ray; chronic bronchial thickening.       Estimated body mass index is 29.22 kg/m as calculated from the following:   Height as of this encounter: 5' (1.524 m).   Weight as of this encounter: 67.9 kg.   DVT prophylaxis: SCD Code Status: Full code Family Communication: Sister  at bedside.  Disposition Plan:  Status is: Inpatient Remains inpatient appropriate because: management of GI bleed.     Consultants:  GI Dr Myna Hidalgo  Procedures:    Antimicrobials:    Subjective: She just got Bowel prep. Denies pain. Awaiting to go for sigmoidoscopy  Objective: Vitals:   03/22/23 1442 03/22/23 1450 03/22/23 1500 03/22/23 1511  BP: (!) 138/55 (!) 154/46 (!) 146/53 (!) 158/48  Pulse: 75 78 81 84  Resp: (!) 9 (!) 9 (!) 9 11  Temp: 98.6 F (37 C)     TempSrc: Temporal     SpO2: 98% 98% 96% 95%  Weight:      Height:        Intake/Output Summary (Last 24 hours) at 03/22/2023 1618 Last data filed at 03/22/2023 1429 Gross per 24 hour  Intake 647.64 ml  Output --  Net 647.64 ml   Filed Weights   03/19/23 1655  Weight: 67.9 kg    Examination:  General exam:  NAD Respiratory system: CTA Cardiovascular system: S 1, S 2 RRR Gastrointestinal system: BS present, soft, nt Central nervous system: Alert, follows command Extremities: no edema   Data Reviewed: I have personally reviewed following labs and imaging studies  CBC:  Recent Labs  Lab 03/19/23 1516 03/19/23 2125 03/20/23 0958 03/20/23 1557 03/21/23 0131 03/21/23 1043 03/22/23 0604  WBC 7.0   < > 6.2 6.8 5.1 5.6 5.3  NEUTROABS 3.7  --   --   --   --   --   --   HGB 8.2*   < > 9.2* 10.0* 8.6* 9.4* 8.5*  HCT 26.0*   < > 27.4* 31.1* 27.1* 29.7* 26.6*  MCV 100.0   < > 97.5 100.0 100.7* 101.0* 100.4*  PLT 247   < > 214 214 225 242 197   < > = values in this interval not displayed.    Basic Metabolic Panel: Recent Labs  Lab 03/19/23 1516 03/19/23 2030 03/20/23 0530 03/21/23 0131 03/22/23 0604  NA 134*  --  133*  --  130*  K 4.0  --  3.7  --  4.2  CL 97*  --  98  --  98  CO2 28  --  26  --  26  GLUCOSE 165*  --  116*  --  101*  BUN 24*  --  19  --  13  CREATININE 0.66  --  0.62  --  0.74  CALCIUM 9.5  --  8.4*  --  8.0*  MG  --  2.2  --  2.2  --   PHOS  --  4.2  --  4.1  --    GFR: Estimated Creatinine Clearance: 39.4 mL/min (by C-G formula based on SCr of 0.74 mg/dL). Liver Function Tests: Recent Labs  Lab 03/19/23 1516 03/20/23 0530  AST 39 36  ALT 52* 50*  ALKPHOS 64 47  BILITOT 0.8 0.9  PROT 6.7 5.6*  ALBUMIN 4.0 3.1*   No results for input(s): "LIPASE", "AMYLASE" in the last 168 hours. No results for input(s): "AMMONIA" in the last 168 hours. Coagulation Profile: No results for input(s): "INR", "PROTIME" in the last 168 hours. Cardiac Enzymes: Recent Labs  Lab 03/19/23 2030  CKTOTAL 48   BNP (last 3 results) No results for input(s): "PROBNP" in the last 8760 hours. HbA1C: No results for input(s): "HGBA1C" in the last 72 hours. CBG: Recent Labs  Lab 03/22/23 0356 03/22/23 0746 03/22/23 1148 03/22/23 1455 03/22/23 1612  GLUCAP 96 112* 104* 114* 134*   Lipid Profile: No results for input(s): "CHOL", "HDL", "LDLCALC", "TRIG", "CHOLHDL", "LDLDIRECT" in the last 72 hours. Thyroid Function Tests: Recent Labs    03/20/23 1031  TSH 5.468*   Anemia Panel: No results for input(s): "VITAMINB12", "FOLATE", "FERRITIN", "TIBC", "IRON", "RETICCTPCT" in the last 72 hours. Sepsis Labs: No results for input(s): "PROCALCITON", "LATICACIDVEN" in the last 168 hours.  No results found for this or any previous visit (from the past 240 hour(s)).       Radiology Studies: No results found.      Scheduled Meds:  amiodarone  200 mg Oral Daily   atorvastatin  20 mg Oral Daily   escitalopram  5 mg Oral QHS   insulin aspart  0-9 Units  Subcutaneous Q4H   levothyroxine  125 mcg Oral Q0600   loratadine  10 mg Oral Daily   pantoprazole  40 mg Oral QHS   sodium phosphate  1 enema Rectal UD   Continuous Infusions:     LOS: 3 days    Time spent: 35 minutes    Camerin Jimenez A Kasaundra Fahrney, MD [facility]   If 7PM-7AM, please contact night-coverage [email or web address]  03/22/2023, 4:18 PM

## 2023-03-22 NOTE — Anesthesia Postprocedure Evaluation (Addendum)
Anesthesia Post Note  Patient: [name]  Procedure(s) Performed: ESOPHAGOGASTRODUODENOSCOPY (EGD) WITH PROPOFOL BIOPSY HOT HEMOSTASIS (ARGON PLASMA COAGULATION/BICAP)     Patient location during evaluation: Endoscopy Anesthesia Type: MAC Level of consciousness: awake and alert Pain management: pain level controlled Vital Signs Assessment: post-procedure vital signs reviewed and stable Respiratory status: spontaneous breathing, nonlabored ventilation, respiratory function stable and patient connected to nasal cannula oxygen Cardiovascular status: stable and blood pressure returned to baseline Postop Assessment: no apparent nausea or vomiting Anesthetic complications: no   No notable events documented.  Last Vitals:  Vitals:   03/21/23 2139 03/22/23 0356  BP: (!) 129/53 (!) 135/53  Pulse: 71 69  Resp: 18 18  Temp: 37.1 C 36.8 C  SpO2: 90% 96%    Last Pain:  Vitals:   03/22/23 0356  TempSrc: Oral  PainSc:                  Ursula Dermody S

## 2023-03-22 NOTE — Interval H&P Note (Signed)
History and Physical Interval Note:  03/22/2023 2:08 PM  [name]  has presented today for surgery, with the diagnosis of Melena, history of rectal cancer.  The various methods of treatment have been discussed with the patient and family. After consideration of risks, benefits and other options for treatment, the patient has consented to  Procedure(s): FLEXIBLE SIGMOIDOSCOPY (N/A) as a surgical intervention.  The patient's history has been reviewed, patient examined, no change in status, stable for surgery.  I have reviewed the patient's chart and labs.  Questions were answered to the patient's satisfaction.     Lynann Bologna

## 2023-03-22 NOTE — Progress Notes (Signed)
Patient was schedule for her next treatment today. Unfortunately she is currently admitted to the hospital for GI bleed. Will continue to follow for outpatient needs and office follow up.   Oncology Nurse Navigator Documentation     03/22/2023   12:30 PM  Oncology Nurse Navigator Flowsheets  Navigator Follow Up Date: 03/26/2023  Navigator Follow Up Reason: Appointment Review  Navigator Location [facility]  Navigator Encounter Type Appt/Treatment Plan Review  Patient Visit Type MedOnc  Treatment Phase Active Tx  Barriers/Navigation Needs Coordination of Care;Education  Interventions None Required  Acuity Level 2-Minimal Needs (1-2 Barriers Identified)  Support Groups/Services Friends and Family  Time Spent with Patient 15

## 2023-03-22 NOTE — Op Note (Signed)
[facility] Patient Name: [name] Procedure Date: 03/22/2023 MRN: [id number] Attending MD: Lynann Bologna , MD, [id number] Date of Birth: Jan 01, 1929 CSN: [id number] Age: 89 Admit Type: Inpatient Procedure:                Flexible Sigmoidoscopy Indications:              Hematochezia in a patient with known rectal CA Dx                            on FS 12/2022. Providers:                Lynann Bologna, MD, Fransisca Connors, Adin Hector,                            RN, Kandice Robinsons, Technician Referring MD:              Medicines:                Monitored Anesthesia Care Complications:            No immediate complications. Estimated Blood Loss:     Estimated blood loss was minimal. Procedure:                Pre-Anesthesia Assessment:                           - Prior to the procedure, a History and Physical                            was performed, and patient medications and                            allergies were reviewed. The patient's tolerance of                            previous anesthesia was also reviewed. The risks                            and benefits of the procedure and the sedation                            options and risks were discussed with the patient.                            All questions were answered, and informed consent                            was obtained. Prior Anticoagulants: The patient has                            taken Eliquis (apixaban), last dose was 3 days                            prior to procedure. ASA Grade Assessment: III - A  patient with severe systemic disease. After                            reviewing the risks and benefits, the patient was                            deemed in satisfactory condition to undergo the                            procedure.                           After obtaining informed consent, the scope was                            passed under direct vision. The  [id number]-HQ190L                            ([id number]) Olympus colonoscope was introduced                            through the anus and advanced to the the splenic                            flexure. The flexible sigmoidoscopy was                            accomplished without difficulty. The patient                            tolerated the procedure well. The quality of the                            bowel preparation was good. Scope In: 2:26:31 PM Scope Out: 2:35:51 PM Total Procedure Duration: 0 hours 9 minutes 20 seconds  Findings:      A fungating, infiltrative, sessile and ulcerated non-obstructing       medium-sized mass (4 cm x 3 cm) was found in the distal rectum, abutting       the dentate line. The mass was nonobstructing involving one-half of the       lumen circumference. It was very friable and would bleed easily on       contact. This was easily palpated on digital rectal exam involving the       posterior right rectum. The mass was hard. It was biopsied with a cold       forceps for histology.      A few small-mouthed diverticula were found in the sigmoid colon.      The exam was otherwise without abnormality. Impression:               - Distal rectal mass -the size appears to be                            smaller than recent sigmoidoscopy 12/2022. However,  very friable and would bleed easily.                           - Mild sigmoid diverticulosis.                           - The examination was otherwise normal. Moderate Sedation:      Not Applicable - Patient had care per Anesthesia. Recommendation:           - Return patient to hospital ward for ongoing care.                           - Resume previous diet.                           - Await pathology results.                           - Will discuss with Dr. Myna Hidalgo. From GI                            standpoint, given very high risk of bleeding,                            recommend to hold  off on Eliquis (if cardiology and                            Dr. Myna Hidalgo agrees). If Eliquis absolutely needs to                            be resumed, resume 7/13.                           - The findings and recommendations were discussed                            with the patient's family. Procedure Code(s):        --- Professional ---                           [phone], Sigmoidoscopy, flexible; with biopsy, single                            or multiple Diagnosis Code(s):        --- Professional ---                           C20, Malignant neoplasm of rectum                           K92.1, Melena (includes Hematochezia)                           K57.30, Diverticulosis of large intestine without  perforation or abscess without bleeding CPT copyright 2022 [employer] Association. All rights reserved. The codes documented in this report are preliminary and upon coder review may  be revised to meet current compliance requirements. Lynann Bologna, MD 03/22/2023 2:56:10 PM This report has been signed electronically. Number of Addenda: 0

## 2023-03-22 NOTE — Anesthesia Postprocedure Evaluation (Signed)
Anesthesia Post Note  Patient: [name]  Procedure(s) Performed: FLEXIBLE SIGMOIDOSCOPY BIOPSY     Patient location during evaluation: Endoscopy Anesthesia Type: MAC Level of consciousness: awake and alert Pain management: pain level controlled Vital Signs Assessment: post-procedure vital signs reviewed and stable Respiratory status: spontaneous breathing, nonlabored ventilation, respiratory function stable and patient connected to nasal cannula oxygen Cardiovascular status: blood pressure returned to baseline and stable Postop Assessment: no apparent nausea or vomiting Anesthetic complications: no   No notable events documented.  Last Vitals:  Vitals:   03/22/23 1500 03/22/23 1511  BP: (!) 146/53 (!) 158/48  Pulse: 81 84  Resp: (!) 9 11  Temp:    SpO2: 96% 95%    Last Pain:  Vitals:   03/22/23 1511  TempSrc:   PainSc: 7                  Trevor Iha

## 2023-03-22 NOTE — Plan of Care (Signed)

## 2023-03-22 NOTE — Transfer of Care (Signed)
Immediate Anesthesia Transfer of Care Note  Patient: [name]  Procedure(s) Performed: FLEXIBLE SIGMOIDOSCOPY BIOPSY  Patient Location: Endoscopy Unit  Anesthesia Type:MAC  Level of Consciousness: drowsy  Airway & Oxygen Therapy: Patient Spontanous Breathing and Patient connected to face mask  Post-op Assessment: Report given to RN and Post -op Vital signs reviewed and stable  Post vital signs: Reviewed and stable  Last Vitals:  Vitals Value Taken Time  BP    Temp    Pulse    Resp    SpO2      Last Pain:  Vitals:   03/22/23 1330  TempSrc: Temporal  PainSc: 0-No pain      Patients Stated Pain Goal: 2 (03/20/23 2000)  Complications: No notable events documented.

## 2023-03-22 NOTE — Anesthesia Procedure Notes (Signed)
Procedure Name: MAC Date/Time: 03/22/2023 2:20 PM  Performed by: Vanessa [place], CRNAPre-anesthesia Checklist: Patient identified, Emergency Drugs available, Suction available and Patient being monitored Patient Re-evaluated:Patient Re-evaluated prior to induction Oxygen Delivery Method: Simple face mask

## 2023-03-23 ENCOUNTER — Inpatient Hospital Stay (HOSPITAL_COMMUNITY): Payer: Medicare PPO

## 2023-03-23 ENCOUNTER — Encounter (HOSPITAL_COMMUNITY): Payer: Self-pay | Admitting: Gastroenterology

## 2023-03-23 DIAGNOSIS — K922 Gastrointestinal hemorrhage, unspecified: Secondary | ICD-10-CM | POA: Diagnosis not present

## 2023-03-23 LAB — BASIC METABOLIC PANEL
Anion gap: 7 (ref 5–15)
BUN: 14 mg/dL (ref 8–23)
CO2: 27 mmol/L (ref 22–32)
Calcium: 8.2 mg/dL — ABNORMAL LOW (ref 8.9–10.3)
Chloride: 98 mmol/L (ref 98–111)
Creatinine, Ser: 0.79 mg/dL (ref 0.44–1.00)
GFR, Estimated: >60 mL/min (ref >60)
Glucose, Bld: 137 mg/dL — ABNORMAL HIGH (ref 70–99)
Potassium: 4 mmol/L (ref 3.5–5.1)
Sodium: 132 mmol/L — ABNORMAL LOW (ref 135–145)

## 2023-03-23 LAB — SURGICAL PATHOLOGY

## 2023-03-23 LAB — URINALYSIS, ROUTINE W REFLEX MICROSCOPIC
Bilirubin Urine: NEGATIVE
Glucose, UA: NEGATIVE mg/dL
Hgb urine dipstick: NEGATIVE
Ketones, ur: NEGATIVE mg/dL
Nitrite: POSITIVE — AB
Protein, ur: 30 mg/dL — AB
Specific Gravity, Urine: 1.021 (ref 1.005–1.030)
WBC, UA: >50 WBC/hpf (ref 0–5)
pH: 5 (ref 5.0–8.0)

## 2023-03-23 LAB — GLUCOSE, CAPILLARY
Glucose-Capillary: 100 mg/dL — ABNORMAL HIGH (ref 70–99)
Glucose-Capillary: 114 mg/dL — ABNORMAL HIGH (ref 70–99)
Glucose-Capillary: 125 mg/dL — ABNORMAL HIGH (ref 70–99)
Glucose-Capillary: 126 mg/dL — ABNORMAL HIGH (ref 70–99)
Glucose-Capillary: 148 mg/dL — ABNORMAL HIGH (ref 70–99)
Glucose-Capillary: 216 mg/dL — ABNORMAL HIGH (ref 70–99)

## 2023-03-23 LAB — CBC
HCT: 26.4 % — ABNORMAL LOW (ref 36.0–46.0)
Hemoglobin: 8.4 g/dL — ABNORMAL LOW (ref 12.0–15.0)
MCH: 32.7 pg (ref 26.0–34.0)
MCHC: 31.8 g/dL (ref 30.0–36.0)
MCV: 102.7 fL — ABNORMAL HIGH (ref 80.0–100.0)
Platelets: 243 10*3/uL (ref 150–400)
RBC: 2.57 MIL/uL — ABNORMAL LOW (ref 3.87–5.11)
RDW: 24.1 % — ABNORMAL HIGH (ref 11.5–15.5)
WBC: 7.8 10*3/uL (ref 4.0–10.5)
nRBC: 0.3 % — ABNORMAL HIGH (ref 0.0–0.2)

## 2023-03-23 MED ORDER — GUAIFENESIN ER 600 MG PO TB12
600.0000 mg | ORAL_TABLET | Freq: Two times a day (BID) | ORAL | Status: DC
  Administered 2023-03-23 – 2023-03-24 (×3): 600 mg via ORAL
  Filled 2023-03-23 (×3): qty 1

## 2023-03-23 MED ORDER — IPRATROPIUM-ALBUTEROL 0.5-2.5 (3) MG/3ML IN SOLN
3.0000 mL | Freq: Four times a day (QID) | RESPIRATORY_TRACT | Status: DC
  Administered 2023-03-23: 3 mL via RESPIRATORY_TRACT
  Filled 2023-03-23: qty 3

## 2023-03-23 MED ORDER — SODIUM CHLORIDE 0.9 % IV SOLN
2.0000 g | INTRAVENOUS | Status: DC
  Administered 2023-03-23 – 2023-03-26 (×4): 2 g via INTRAVENOUS
  Filled 2023-03-23 (×4): qty 20

## 2023-03-23 MED ORDER — AZITHROMYCIN 250 MG PO TABS
500.0000 mg | ORAL_TABLET | Freq: Every day | ORAL | Status: DC
  Administered 2023-03-23 – 2023-03-26 (×4): 500 mg via ORAL
  Filled 2023-03-23 (×4): qty 2

## 2023-03-23 MED ORDER — IPRATROPIUM-ALBUTEROL 0.5-2.5 (3) MG/3ML IN SOLN
3.0000 mL | Freq: Three times a day (TID) | RESPIRATORY_TRACT | Status: DC
  Administered 2023-03-23 – 2023-03-25 (×5): 3 mL via RESPIRATORY_TRACT
  Filled 2023-03-23 (×5): qty 3

## 2023-03-23 NOTE — Progress Notes (Addendum)
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  She reported lightheadedness.  She was evaluated at her oncologist office and she had very small external hemorrhoid, ocular blood was positive. Hb 8.7   Assessment & Plan:   Principal Problem:   Upper GI bleed Active Problems:   Symptomatic anemia   Hyperlipidemia   Hypothyroidism   Diabetes mellitus without complication (HCC)   HTN (hypertension)   Rectal cancer (HCC)   Paroxysmal atrial fibrillation (HCC)   Wheezing   GI bleed  1-GI Bleed: presents with melena. In setting of rectal cancer, mass and eliquis.  Received one unit PRBC.  GI consulted.  On Protonix  Underwent Endoscopy  7/10: Small hiatal hernia. Gastritis. Biopsied. A few benign gastric polyps. 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 Treated with argon plasma coagulation (APC). CTA; negative for active bleeding.  Underwent sigmoidoscopy 7/11: Showed distal rectal mass the size appears to be smaller than the recent sigmoidoscopy/2024.  However very friable mass and would  bleed easily. GI recommends to hold off on Eliquis if cardiology and Dr. Myna Hidalgo agrees. Dr Myna Hidalgo agrees with holding Eliquis. Discussed with cardiology Dr Rennis Golden, ok to hold Eliquis.  Hb stable, melena resolving.   2-Symptomatic Anemia:  Received one unit PRBC.  Monitor hb. Down to 8.5  Hyperlipidemia: Continue with Lipitor  Diabetes : Continue with a sliding scale insulin  HTN: Hold blood pressure medication  Hypothyroidism: Continue with Synthroid  Rectal cancer:  Follow-up with Dr.  Myna Hidalgo.  PAF; continue to hold Eliquis in the setting of GI bleed Continue with amiodarone  Acute Bronchitis: Respiratory System:  Schedule Nebulizer again.  Incentive spirometry.  Start Mucinex Chest x ray; chronic bronchial thickening.  Chest x ray pending.    UTI; UA with positive nitrates. Started IV ceftriaxone   Estimated body mass index is 29.22 kg/m as calculated from the following:   Height as of this encounter: 5' (1.524 m).   Weight as of this encounter: 67.9 kg.   DVT prophylaxis: SCD Code Status: Full code Family Communication: Son Over phone. 07/12 Disposition Plan:  Status is: Inpatient Remains inpatient appropriate because: management of GI bleed.     Consultants:  GI Dr Myna Hidalgo  Procedures:    Antimicrobials:    Subjective: She is feeling ok,. Still having wheezing.  Had BM less black stool, mix with brown stool.   Objective: Vitals:   03/22/23 1511 03/22/23 2102 03/23/23 0400 03/23/23 1203  BP: (!) 158/48 (!) 128/48 (!) 157/56   Pulse: 84 71 76   Resp: 11 18 18    Temp:  98.9 F (37.2 C) 98 F (36.7 C)   TempSrc:  Oral Oral   SpO2: 95% 98% 98% 99%  Weight:      Height:        Intake/Output Summary (Last 24 hours) at 03/23/2023 1320 Last data filed at 03/23/2023 1100 Gross per 24 hour  Intake 540 ml  Output 106 ml  Net 434 ml   Filed Weights   03/19/23 1655  Weight: 67.9 kg    Examination:  General exam: NAD Respiratory system: CTA Cardiovascular system: S 1, S 2 RRR Gastrointestinal system: BS present, soft,  Central nervous system: alert Extremities: no edema   Data Reviewed: I have personally reviewed following labs and imaging studies  CBC: Recent Labs  Lab 03/19/23 1516 03/19/23 2125 03/20/23 1557 03/21/23 0131 03/21/23 1043 03/22/23 0604 03/23/23 0938  WBC 7.0   < > 6.8 5.1 5.6 5.3 7.8  NEUTROABS 3.7  --   --   --   --   --   --   HGB 8.2*   < > 10.0* 8.6* 9.4* 8.5* 8.4*  HCT 26.0*   < > 31.1* 27.1*  29.7* 26.6* 26.4*  MCV 100.0   < > 100.0 100.7* 101.0* 100.4* 102.7*  PLT 247   < > 214 225 242 197 243   < > = values in this interval not displayed.   Basic Metabolic Panel: Recent Labs  Lab 03/19/23 1516 03/19/23 2030 03/20/23 0530 03/21/23 0131 03/22/23 0604 03/23/23 0938  NA 134*  --  133*  --  130* 132*  K 4.0  --  3.7  --  4.2 4.0  CL 97*  --  98  --  98 98  CO2 28  --  26  --  26 27  GLUCOSE 165*  --  116*  --  101* 137*  BUN 24*  --  19  --  13 14  CREATININE 0.66  --  0.62  --  0.74 0.79  CALCIUM 9.5  --  8.4*  --  8.0* 8.2*  MG  --  2.2  --  2.2  --   --   PHOS  --  4.2  --  4.1  --   --    GFR: Estimated Creatinine Clearance: 39.4 mL/min (by C-G formula based on SCr of 0.79 mg/dL). Liver Function Tests: Recent Labs  Lab 03/19/23 1516 03/20/23 0530  AST 39 36  ALT 52* 50*  ALKPHOS 64 47  BILITOT 0.8 0.9  PROT 6.7 5.6*  ALBUMIN 4.0 3.1*   No results for input(s): "LIPASE", "AMYLASE" in the last 168 hours. No results for input(s): "AMMONIA" in the last 168 hours. Coagulation Profile: No results for input(s): "INR", "PROTIME" in the last 168 hours. Cardiac Enzymes: Recent Labs  Lab 03/19/23 2030  CKTOTAL 48   BNP (last 3 results) No results for input(s): "PROBNP" in the last 8760 hours. HbA1C: No results for input(s): "HGBA1C" in the last 72 hours. CBG: Recent Labs  Lab 03/22/23 2058 03/22/23 2359 03/23/23 0357 03/23/23 0736 03/23/23 1212  GLUCAP 173* 100* 114* 126* 125*   Lipid Profile: No results for input(s): "CHOL", "HDL", "LDLCALC", "TRIG", "CHOLHDL", "LDLDIRECT" in the last 72 hours. Thyroid Function Tests: No results for input(s): "TSH", "T4TOTAL", "FREET4", "T3FREE", "THYROIDAB" in the last 72 hours.  Anemia Panel: No results for input(s): "VITAMINB12", "FOLATE", "FERRITIN", "TIBC", "IRON", "RETICCTPCT" in the last 72 hours. Sepsis Labs: No results for input(s): "PROCALCITON", "LATICACIDVEN" in the last 168 hours.  No results  found for this or any previous visit (from the past 240 hour(s)).       Radiology Studies: No results found.      Scheduled Meds:  amiodarone  200 mg Oral Daily   atorvastatin  20 mg Oral Daily   escitalopram  5 mg Oral QHS   insulin aspart  0-9 Units Subcutaneous Q4H   ipratropium-albuterol  3 mL Nebulization TID   levothyroxine  125 mcg Oral Q0600   loratadine  10 mg Oral Daily   pantoprazole  40 mg Oral QHS   sodium phosphate  1 enema Rectal UD   Continuous Infusions:  cefTRIAXone (ROCEPHIN)  IV 2 g (03/23/23 1242)      LOS: 4 days    Time spent: 35 minutes    Azarria Balint A Meiko Ives, MD [facility]   If 7PM-7AM, please contact night-coverage [email or web address]  03/23/2023, 1:20 PM

## 2023-03-23 NOTE — Progress Notes (Signed)
[name] underwent the flexible sigmoidoscopy yesterday.  This did show the tumor which appeared to be smaller.  However it was quite friable.  I am unsure of this where [name] was bleeding from.  I would not think so given that [name] had melena.  I do not have back her labs yet today.  However, her hemoglobin was 8.5 yesterday.  He will be interesting to see what the labs are today.  [name] clearly needs to have a Port-A-Cath.  [name] has very little, if any IV access.  Currently, [name] has an IV in her right thumb.  I just cannot have this with her.  I talked her about a Port-A-Cath.  I explained how this was put in.  [name] would be agreeable.  I am not sure if this can be done today.  [name] does not have any abdominal pain.  There is no cough.  Her vital signs are temperature of 98.  Pulse 76.  Blood pressure 157/56. Her lungs are clear bilaterally.  There may be little bit of wheezing over on the right side.  Cardiac exam is regular rate and rhythm.  Abdomen is soft.  Bowel sounds are present.  There is no fluid wave.  There is no palpable liver or spleen tip.  Extremity shows no clubbing, cyanosis or edema.  Neurological exam is nonfocal.  Ms. Willhelm came in with GI bleeding.  It is hard to say where the bleeding is coming from.  I know [name] has a rectal tumor but I would think that the blood would be bright red.  Again looks like [name] is responding to treatment.  I would just continue her on the Keytruda and the Xeloda.  Again [name] needs to have a Port-A-Cath put in.  I will place the order for this.  I do appreciate the great care [name] is getting from everybody up on [address].  [name], MD  Colossians 3:23

## 2023-03-23 NOTE — Plan of Care (Signed)

## 2023-03-23 NOTE — Plan of Care (Signed)
IR aware of request for port placement while inpatient - unable to accommodate today due to IR schedule as well as patient not being NPO.  Will plan for port placement Monday 03/26/23 if patient remains in house. If patient is discharged home over the weekend please place an outpatient order for port placement and IR schedulers will call her to setup a date/time for the procedure.  Plan: - NPO at midnight on 7/15 - No pre-procedure labs required - Anticoagulation does not need to be held - IR APP will see for consult/consent  Please call with questions or concerns.  Lynnette Caffey, PA-C

## 2023-03-23 NOTE — Care Management Important Message (Signed)
Important Message  Patient Details IM Letter given Name: [name] MRN: [id number] Date of Birth: 30-Jul-1929   Medicare Important Message Given:  Yes     [name] 03/23/2023, 3:51 PM

## 2023-03-23 NOTE — Plan of Care (Signed)
Problem: Education: Goal: Ability to describe self-care measures that may prevent or decrease complications (Diabetes Survival Skills Education) will improve Outcome: Progressing   Problem: Fluid Volume: Goal: Ability to maintain a balanced intake and output will improve Outcome: Progressing   Problem: Clinical Measurements: Goal: Ability to maintain clinical measurements within normal limits will improve Outcome: Progressing  Problem: Pain Managment: Goal: General experience of comfort will improve Outcome: Progressing   Haydee Salter, RN 03/23/23 2:23 PM

## 2023-03-24 DIAGNOSIS — K922 Gastrointestinal hemorrhage, unspecified: Secondary | ICD-10-CM | POA: Diagnosis not present

## 2023-03-24 LAB — GLUCOSE, CAPILLARY
Glucose-Capillary: 106 mg/dL — ABNORMAL HIGH (ref 70–99)
Glucose-Capillary: 117 mg/dL — ABNORMAL HIGH (ref 70–99)
Glucose-Capillary: 117 mg/dL — ABNORMAL HIGH (ref 70–99)
Glucose-Capillary: 136 mg/dL — ABNORMAL HIGH (ref 70–99)
Glucose-Capillary: 168 mg/dL — ABNORMAL HIGH (ref 70–99)
Glucose-Capillary: 93 mg/dL (ref 70–99)

## 2023-03-24 LAB — CBC
HCT: 25.8 % — ABNORMAL LOW (ref 36.0–46.0)
Hemoglobin: 8.2 g/dL — ABNORMAL LOW (ref 12.0–15.0)
MCH: 32.7 pg (ref 26.0–34.0)
MCHC: 31.8 g/dL (ref 30.0–36.0)
MCV: 102.8 fL — ABNORMAL HIGH (ref 80.0–100.0)
Platelets: 232 10*3/uL (ref 150–400)
RBC: 2.51 MIL/uL — ABNORMAL LOW (ref 3.87–5.11)
RDW: 23.8 % — ABNORMAL HIGH (ref 11.5–15.5)
WBC: 7 10*3/uL (ref 4.0–10.5)
nRBC: 0 % (ref 0.0–0.2)

## 2023-03-24 LAB — BASIC METABOLIC PANEL
Anion gap: 8 (ref 5–15)
BUN: 15 mg/dL (ref 8–23)
CO2: 27 mmol/L (ref 22–32)
Calcium: 8.1 mg/dL — ABNORMAL LOW (ref 8.9–10.3)
Chloride: 98 mmol/L (ref 98–111)
Creatinine, Ser: 0.78 mg/dL (ref 0.44–1.00)
GFR, Estimated: >60 mL/min (ref >60)
Glucose, Bld: 115 mg/dL — ABNORMAL HIGH (ref 70–99)
Potassium: 4 mmol/L (ref 3.5–5.1)
Sodium: 133 mmol/L — ABNORMAL LOW (ref 135–145)

## 2023-03-24 LAB — URINE CULTURE

## 2023-03-24 LAB — PREPARE RBC (CROSSMATCH)

## 2023-03-24 MED ORDER — GUAIFENESIN ER 600 MG PO TB12
1200.0000 mg | ORAL_TABLET | Freq: Two times a day (BID) | ORAL | Status: DC
  Administered 2023-03-24 – 2023-03-30 (×12): 1200 mg via ORAL
  Filled 2023-03-24 (×12): qty 2

## 2023-03-24 MED ORDER — FUROSEMIDE 10 MG/ML IJ SOLN
INTRAMUSCULAR | Status: AC
  Administered 2023-03-24: 20 mg via INTRAVENOUS
  Filled 2023-03-24: qty 2

## 2023-03-24 MED ORDER — FOLIC ACID 1 MG PO TABS
1.0000 mg | ORAL_TABLET | Freq: Every day | ORAL | Status: DC
  Administered 2023-03-24 – 2023-03-30 (×7): 1 mg via ORAL
  Filled 2023-03-24 (×7): qty 1

## 2023-03-24 MED ORDER — SODIUM CHLORIDE 0.9% IV SOLUTION: Freq: Once | INTRAVENOUS | Status: DC

## 2023-03-24 MED ORDER — BUDESONIDE 0.25 MG/2ML IN SUSP
0.2500 mg | Freq: Two times a day (BID) | RESPIRATORY_TRACT | Status: DC
  Administered 2023-03-24 – 2023-03-30 (×12): 0.25 mg via RESPIRATORY_TRACT
  Filled 2023-03-24 (×12): qty 2

## 2023-03-24 MED ORDER — FUROSEMIDE 10 MG/ML IJ SOLN
20.0000 mg | Freq: Once | INTRAMUSCULAR | Status: AC
  Administered 2023-03-24: 20 mg via INTRAVENOUS
  Filled 2023-03-24: qty 2

## 2023-03-24 MED ORDER — SODIUM CHLORIDE 0.9 % IV SOLN
510.0000 mg | Freq: Once | INTRAVENOUS | Status: AC
  Administered 2023-03-25: 510 mg via INTRAVENOUS
  Filled 2023-03-24: qty 17

## 2023-03-24 MED ORDER — FUROSEMIDE 10 MG/ML IJ SOLN
20.0000 mg | Freq: Once | INTRAMUSCULAR | Status: AC
  Filled 2023-03-24: qty 2

## 2023-03-24 NOTE — Progress Notes (Signed)
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  She reported lightheadedness.  She was evaluated at her oncologist office and she had very small external hemorrhoid, ocular blood was positive. Hb 8.7   Assessment & Plan:   Principal Problem:   Upper GI bleed Active Problems:   Symptomatic anemia   Hyperlipidemia   Hypothyroidism   Diabetes mellitus without complication (HCC)   HTN (hypertension)   Rectal cancer (HCC)   Paroxysmal atrial fibrillation (HCC)   Wheezing   GI bleed  1-GI Bleed: presents with melena. In setting of rectal cancer, mass and eliquis.  Received one unit PRBC.  GI consulted.  On Protonix  Underwent Endoscopy  7/10: Small hiatal hernia. Gastritis. Biopsied. A few benign gastric polyps. 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 Treated with argon plasma coagulation (APC). CTA; negative for active bleeding.  Underwent sigmoidoscopy 7/11: Showed distal rectal mass the size appears to be smaller than the recent sigmoidoscopy/2024.  However very friable mass and would  bleed easily. GI recommends to hold off on Eliquis if cardiology and Dr. Myna Hidalgo agrees. Dr Myna Hidalgo agrees with holding Eliquis. Discussed with cardiology Dr Rennis Golden, ok to hold Eliquis.  Hb stable, melena resolving.  One unit PRBC ordered for today.   2-Symptomatic Anemia:  Received one unit PRBC.  Hb down to 8.2, plan to proceed with one unit PRBC.   Hyperlipidemia: Continue with Lipitor  Diabetes : Continue with a sliding scale insulin  HTN: Hold blood pressure medication  Hypothyroidism: Continue  with Synthroid  Rectal cancer:  Follow-up with Dr. Myna Hidalgo.  PAF; continue to hold Eliquis in the setting of GI bleed Continue with amiodarone  PNA, Acute Bronchitis: Respiratory System:  Schedule Nebulizer again.  Incentive spirometry.  Continue with  Mucinex Chest x ray; chronic bronchial thickening.  Chest x ray 7/12 stable nodular density, new opacity in right upper lobe.  Will add Pulmicort.  Started on Treatment for PNA. Continue with ceftriaxone and Azithro.   UTI; UA with positive nitrates. Continue with  IV ceftriaxone Follow urine culture.   Estimated body mass index is 29.22 kg/m as calculated from the following:   Height as of this encounter: 5' (1.524 m).   Weight as of this encounter: 67.9 kg.   DVT prophylaxis: SCD Code Status: Full code Family Communication: Son Over phone. 07/13 Disposition Plan:  Status is: Inpatient Remains inpatient appropriate because: management of GI bleed.     Consultants:  GI Dr Myna Hidalgo  Procedures:    Antimicrobials:    Subjective: She had an episode of vomiting earlier this morning.  Report cough.  She was able to eat breakfast.   Objective: Vitals:   03/24/23 1336 03/24/23 1348 03/24/23 1403 03/24/23 1405  BP: (!) 118/46  (!) 115/37 (!) 115/37  Pulse: 75  73 73  Resp: 13 14  16   Temp: 98.1 F (36.7 C)  98 F (36.7 C) 98.1 F (36.7 C)  TempSrc: Oral  Oral Oral  SpO2: 92%  90% 90%  Weight:      Height:        Intake/Output Summary (Last 24 hours) at 03/24/2023 1409 Last data filed at 03/24/2023 1348 Gross per  24 hour  Intake 240 ml  Output --  Net 240 ml   Filed Weights   03/19/23 1655  Weight: 67.9 kg    Examination:  General exam: NAD Respiratory system: Sporadic wheezing Cardiovascular system: S,1 S 2 RRR Gastrointestinal system: BS present, soft, nt Central nervous system: Alert Extremities: no edema   Data Reviewed: I have personally reviewed following labs and imaging  studies  CBC: Recent Labs  Lab 03/19/23 1516 03/19/23 2125 03/21/23 0131 03/21/23 1043 03/22/23 0604 03/23/23 0938 03/24/23 0559  WBC 7.0   < > 5.1 5.6 5.3 7.8 7.0  NEUTROABS 3.7  --   --   --   --   --   --   HGB 8.2*   < > 8.6* 9.4* 8.5* 8.4* 8.2*  HCT 26.0*   < > 27.1* 29.7* 26.6* 26.4* 25.8*  MCV 100.0   < > 100.7* 101.0* 100.4* 102.7* 102.8*  PLT 247   < > 225 242 197 243 232   < > = values in this interval not displayed.   Basic Metabolic Panel: Recent Labs  Lab 03/19/23 1516 03/19/23 2030 03/20/23 0530 03/21/23 0131 03/22/23 0604 03/23/23 0938 03/24/23 0559  NA 134*  --  133*  --  130* 132* 133*  K 4.0  --  3.7  --  4.2 4.0 4.0  CL 97*  --  98  --  98 98 98  CO2 28  --  26  --  26 27 27   GLUCOSE 165*  --  116*  --  101* 137* 115*  BUN 24*  --  19  --  13 14 15   CREATININE 0.66  --  0.62  --  0.74 0.79 0.78  CALCIUM 9.5  --  8.4*  --  8.0* 8.2* 8.1*  MG  --  2.2  --  2.2  --   --   --   PHOS  --  4.2  --  4.1  --   --   --    GFR: Estimated Creatinine Clearance: 39.4 mL/min (by C-G formula based on SCr of 0.78 mg/dL). Liver Function Tests: Recent Labs  Lab 03/19/23 1516 03/20/23 0530  AST 39 36  ALT 52* 50*  ALKPHOS 64 47  BILITOT 0.8 0.9  PROT 6.7 5.6*  ALBUMIN 4.0 3.1*   No results for input(s): "LIPASE", "AMYLASE" in the last 168 hours. No results for input(s): "AMMONIA" in the last 168 hours. Coagulation Profile: No results for input(s): "INR", "PROTIME" in the last 168 hours. Cardiac Enzymes: Recent Labs  Lab 03/19/23 2030  CKTOTAL 48   BNP (last 3 results) No results for input(s): "PROBNP" in the last 8760 hours. HbA1C: No results for input(s): "HGBA1C" in the last 72 hours. CBG: Recent Labs  Lab 03/23/23 2037 03/24/23 0050 03/24/23 0434 03/24/23 0755 03/24/23 1155  GLUCAP 216* 93 106* 117* 168*   Lipid Profile: No results for input(s): "CHOL", "HDL", "LDLCALC", "TRIG", "CHOLHDL", "LDLDIRECT" in the last 72 hours. Thyroid  Function Tests: No results for input(s): "TSH", "T4TOTAL", "FREET4", "T3FREE", "THYROIDAB" in the last 72 hours.  Anemia Panel: No results for input(s): "VITAMINB12", "FOLATE", "FERRITIN", "TIBC", "IRON", "RETICCTPCT" in the last 72 hours. Sepsis Labs: No results for input(s): "PROCALCITON", "LATICACIDVEN" in the last 168 hours.  No results found for this or any previous visit (from the past 240 hour(s)).       Radiology Studies: DG Chest 2 View  Result Date: 03/23/2023 CLINICAL DATA:  Wheezing. EXAM: CHEST -  2 VIEW COMPARISON:  March 19, 2023. FINDINGS: Stable cardiomediastinal silhouette. Stable nodular density is noted laterally in the right upper lobe. Immediately below it is a new opacity concerning for possible pneumonia. Left lung is clear. Bony thorax is unremarkable. IMPRESSION: Stable nodular density seen laterally in right upper lobe as seen on prior exam and concerning for nodule as noted on prior CT scan of Jan 20, 2023. However, new opacity is seen inferior to this nodule in right upper lobe concerning for possible pneumonia. Electronically Signed   By: Lupita Raider M.D.   On: 03/23/2023 14:40        Scheduled Meds:  sodium chloride   Intravenous Once   amiodarone  200 mg Oral Daily   atorvastatin  20 mg Oral Daily   azithromycin  500 mg Oral Daily   budesonide (PULMICORT) nebulizer solution  0.25 mg Nebulization BID   escitalopram  5 mg Oral QHS   folic acid  1 mg Oral Daily   furosemide  20 mg Intravenous Once   furosemide  20 mg Intravenous Once   guaiFENesin  600 mg Oral BID   insulin aspart  0-9 Units Subcutaneous Q4H   ipratropium-albuterol  3 mL Nebulization TID   levothyroxine  125 mcg Oral Q0600   loratadine  10 mg Oral Daily   pantoprazole  40 mg Oral QHS   sodium phosphate  1 enema Rectal UD   Continuous Infusions:  cefTRIAXone (ROCEPHIN)  IV 2 g (03/24/23 0957)   [START ON 03/25/2023] ferumoxytol (FERAHEME) 510 mg in sodium chloride 0.9 % 100 mL  IVPB        LOS: 5 days    Time spent: 35 minutes    Tine Mabee A Daquan Crapps, MD [facility]   If 7PM-7AM, please contact night-coverage [email or web address]  03/24/2023, 2:09 PM

## 2023-03-24 NOTE — Progress Notes (Signed)
[name] has some nausea and vomiting this morning.  This could be from a UTI.  I know that she had a urinalysis that was suggestive of UTI.  Culture is pending.  She is on IV antibiotic.    She  had a chest x-ray yesterday which showed an Infiltrate in the right lung.  I think it would be wise to try to get a CT scan to see if we get a better look.  Her hemoglobin is 8.2.  I think we are going to have to transfuse her.  She has horrible IV access.  She has an IV in her right thumb.  Hopefully this will be enough for a blood transfusion.  I know that IR will try to place a Port-A-Cath on Monday.  The Port-A-Cath I think is going to be critical.  I have to believe that she probably is bleeding from this rectal tumor.  Given the fact, I suspect we are going to have to think about getting XRT to the rectum.  I hate that we may have to go down this route.  I know this will add some toxicity to her.  She is off Eliquis.  Again, I think that she will have to be off Eliquis for a while.  So far, I am not detecting any atrial fibrillation.  She has had no cough.  There is been no obvious shortness of breath.  I talked to her about the blood transfusion.  She has had blood before.  I just think that this will improve her status.  Her vital signs show temperature 100.5.  Pulse 80.  Blood pressure 129/53.  Her head and neck exam shows no ocular or oral lesions.  She has no adenopathy in the neck.  Lungs are clear bilaterally.  Cardiac exam regular rate and rhythm.  She has no murmurs.  Abdomen is soft.  Bowel sounds are present.  There is no fluid wave.  There is no palpable liver or spleen tip.  Extremity shows no clubbing, cyanosis or edema.  For right now, [name] has several issues ongoing.  She I think is still having some GI blood loss.  Her hemoglobin is only 8.2.  She needs to be transfused.  She may have a UTI.  She may have pneumonia.  Again there is a lot going on.  She needs to have a  Port-A-Cath put in as she has horrible IV access.  I did speak with her son last night.  I did try to give him an overview as to what I thought was going on and how we can try to continue to help his mom.  Again, I think we may have to think about radiation therapy to the rectal area.  I do appreciate the wonderful care she is getting from all the staff upon [address].   [name], MD  Psalms 23:4

## 2023-03-24 NOTE — Plan of Care (Signed)

## 2023-03-25 ENCOUNTER — Encounter (HOSPITAL_COMMUNITY): Payer: Self-pay | Admitting: Internal Medicine

## 2023-03-25 ENCOUNTER — Inpatient Hospital Stay (HOSPITAL_COMMUNITY): Payer: Medicare PPO

## 2023-03-25 DIAGNOSIS — K922 Gastrointestinal hemorrhage, unspecified: Secondary | ICD-10-CM | POA: Diagnosis not present

## 2023-03-25 LAB — CBC WITH DIFFERENTIAL/PLATELET
Abs Immature Granulocytes: 0.03 10*3/uL (ref 0.00–0.07)
Basophils Absolute: 0 10*3/uL (ref 0.0–0.1)
Basophils Relative: 0 %
Eosinophils Absolute: 0 10*3/uL (ref 0.0–0.5)
Eosinophils Relative: 1 %
HCT: 35.6 % — ABNORMAL LOW (ref 36.0–46.0)
Hemoglobin: 11.8 g/dL — ABNORMAL LOW (ref 12.0–15.0)
Immature Granulocytes: 0 %
Lymphocytes Relative: 15 %
Lymphs Abs: 1.1 10*3/uL (ref 0.7–4.0)
MCH: 31.2 pg (ref 26.0–34.0)
MCHC: 33.1 g/dL (ref 30.0–36.0)
MCV: 94.2 fL (ref 80.0–100.0)
Monocytes Absolute: 0.7 10*3/uL (ref 0.1–1.0)
Monocytes Relative: 9 %
Neutro Abs: 5.4 10*3/uL (ref 1.7–7.7)
Neutrophils Relative %: 75 %
Platelets: 224 10*3/uL (ref 150–400)
RBC: 3.78 MIL/uL — ABNORMAL LOW (ref 3.87–5.11)
RDW: 22.7 % — ABNORMAL HIGH (ref 11.5–15.5)
WBC: 7.3 10*3/uL (ref 4.0–10.5)
nRBC: 0 % (ref 0.0–0.2)

## 2023-03-25 LAB — COMPREHENSIVE METABOLIC PANEL
ALT: 80 U/L — ABNORMAL HIGH (ref 0–44)
AST: 36 U/L (ref 15–41)
Albumin: 3.1 g/dL — ABNORMAL LOW (ref 3.5–5.0)
Alkaline Phosphatase: 72 U/L (ref 38–126)
Anion gap: 12 (ref 5–15)
BUN: 11 mg/dL (ref 8–23)
CO2: 31 mmol/L (ref 22–32)
Calcium: 8.3 mg/dL — ABNORMAL LOW (ref 8.9–10.3)
Chloride: 94 mmol/L — ABNORMAL LOW (ref 98–111)
Creatinine, Ser: 0.62 mg/dL (ref 0.44–1.00)
GFR, Estimated: >60 mL/min (ref >60)
Glucose, Bld: 107 mg/dL — ABNORMAL HIGH (ref 70–99)
Potassium: 3 mmol/L — ABNORMAL LOW (ref 3.5–5.1)
Sodium: 137 mmol/L (ref 135–145)
Total Bilirubin: 1.7 mg/dL — ABNORMAL HIGH (ref 0.3–1.2)
Total Protein: 6.2 g/dL — ABNORMAL LOW (ref 6.5–8.1)

## 2023-03-25 LAB — URINE CULTURE: Culture: >=100000 — AB

## 2023-03-25 LAB — GLUCOSE, CAPILLARY
Glucose-Capillary: 110 mg/dL — ABNORMAL HIGH (ref 70–99)
Glucose-Capillary: 111 mg/dL — ABNORMAL HIGH (ref 70–99)
Glucose-Capillary: 122 mg/dL — ABNORMAL HIGH (ref 70–99)
Glucose-Capillary: 145 mg/dL — ABNORMAL HIGH (ref 70–99)
Glucose-Capillary: 174 mg/dL — ABNORMAL HIGH (ref 70–99)
Glucose-Capillary: 186 mg/dL — ABNORMAL HIGH (ref 70–99)

## 2023-03-25 LAB — PREALBUMIN: Prealbumin: 10 mg/dL — ABNORMAL LOW (ref 18–38)

## 2023-03-25 MED ORDER — POTASSIUM CHLORIDE CRYS ER 20 MEQ PO TBCR
40.0000 meq | EXTENDED_RELEASE_TABLET | Freq: Once | ORAL | Status: AC
  Administered 2023-03-25: 40 meq via ORAL
  Filled 2023-03-25: qty 2

## 2023-03-25 MED ORDER — IPRATROPIUM-ALBUTEROL 0.5-2.5 (3) MG/3ML IN SOLN
3.0000 mL | Freq: Two times a day (BID) | RESPIRATORY_TRACT | Status: DC
  Administered 2023-03-25 – 2023-03-30 (×10): 3 mL via RESPIRATORY_TRACT
  Filled 2023-03-25 (×10): qty 3

## 2023-03-25 MED ORDER — SODIUM CHLORIDE (PF) 0.9 % IJ SOLN
INTRAMUSCULAR | Status: AC
  Filled 2023-03-25: qty 50

## 2023-03-25 MED ORDER — IOHEXOL 300 MG/ML  SOLN
75.0000 mL | Freq: Once | INTRAMUSCULAR | Status: AC | PRN
  Administered 2023-03-25: 75 mL via INTRAVENOUS

## 2023-03-25 NOTE — Progress Notes (Signed)
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  She reported lightheadedness.  She was evaluated at her oncologist office and she had very small external hemorrhoid, ocular blood was positive. Hb 8.7   Assessment & Plan:   Principal Problem:   Upper GI bleed Active Problems:   Symptomatic anemia   Hyperlipidemia   Hypothyroidism   Diabetes mellitus without complication (HCC)   HTN (hypertension)   Rectal cancer (HCC)   Paroxysmal atrial fibrillation (HCC)   Wheezing   GI bleed  1-GI Bleed: Presents with melena. In setting of rectal cancer, mass and eliquis.  -She has received total 2 units PRBC this hospitalization.  -On Protonix  -Underwent Endoscopy  7/10: Small hiatal hernia. Gastritis. Biopsied. A few benign gastric polyps. 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 Treated with argon plasma coagulation (APC). -CTA; negative for active bleeding.  -Underwent sigmoidoscopy 7/11: Showed distal rectal mass the size appears to be smaller than the recent sigmoidoscopy/2024.  However very friable mass and would  bleed easily. -GI recommends to hold off on Eliquis if cardiology and Dr. Myna Hidalgo agrees. Dr Myna Hidalgo agrees with holding Eliquis. Discussed with cardiology Dr Rennis Golden, ok to hold Eliquis.  -Hb stable, melena resolving.    2-Symptomatic Anemia:  Received two  unit PRBC during this hospitalization. Last transfusion was 7/13. Hb increase to 11 from 8 after transfusion. .    Hyperlipidemia: Continue with Lipitor  Diabetes : Continue with a sliding scale insulin  HTN: Hold blood  pressure medication  Hypothyroidism: Continue with Synthroid  Rectal cancer:  Follow-up with Dr. Myna Hidalgo.  PAF; Continue to hold Eliquis in the setting of GI bleed, plan to hold indefinitely for now. Needs to follow up with Dr Melburn Popper out patient  Continue with amiodarone  PNA, Acute Bronchitis: Respiratory System:  Schedule Nebulizer again.  Incentive spirometry.  Continue with  Mucinex Chest x ray; chronic bronchial thickening.  Chest x ray 7/12 stable nodular density, new opacity in right upper lobe.  Continue with Pulmicort.  Started on Treatment for PNA. Continue with ceftriaxone and Azithro.  CT chest ordered by Dr Myna Hidalgo.   UTI; UA with positive nitrates. Continue with  IV ceftriaxone Urine culture; growing E coli.   Estimated body mass index is 29.22 kg/m as calculated from the following:   Height as of this encounter: 5' (1.524 m).   Weight as of this encounter: 67.9 kg.   DVT prophylaxis: SCD Code Status: Full code Family Communication: Son Over phone. 07/13 Disposition Plan:  Status is: Inpatient Remains inpatient appropriate because: management of GI bleed.     Consultants:  GI Dr Myna Hidalgo  Procedures:    Antimicrobials:    Subjective: She is sitting recliner, just came from getting ct chest. Report no worsening cough. Denies dyspnea. Had BM with mix color stool brown and black.   Objective: Vitals:   03/24/23 1932 03/24/23 2016 03/25/23 0401 03/25/23 0723  BP:  (!) 151/56 (!) 142/59   Pulse:  88 73   Resp:  16 16   Temp:  98.6 F (37 C) 98.7 F (37.1 C)   TempSrc:  Oral Oral   SpO2: 97% 93% 97% 96%  Weight:  Height:        Intake/Output Summary (Last 24 hours) at 03/25/2023 1228 Last data filed at 03/25/2023 0900 Gross per 24 hour  Intake 1178 ml  Output --  Net 1178 ml   Filed Weights   03/19/23 1655  Weight: 67.9 kg    Examination:  General exam: NAD Respiratory system: No wheezing. BL air movement, BL ronchus.   Cardiovascular system: S 1, S 2 RRR Gastrointestinal system: BS present, soft, nt Central nervous system: Alert, follows command Extremities: no edema   Data Reviewed: I have personally reviewed following labs and imaging studies  CBC: Recent Labs  Lab 03/19/23 1516 03/19/23 2125 03/21/23 1043 03/22/23 0604 03/23/23 0938 03/24/23 0559 03/25/23 0539  WBC 7.0   < > 5.6 5.3 7.8 7.0 7.3  NEUTROABS 3.7  --   --   --   --   --  5.4  HGB 8.2*   < > 9.4* 8.5* 8.4* 8.2* 11.8*  HCT 26.0*   < > 29.7* 26.6* 26.4* 25.8* 35.6*  MCV 100.0   < > 101.0* 100.4* 102.7* 102.8* 94.2  PLT 247   < > 242 197 243 232 224   < > = values in this interval not displayed.   Basic Metabolic Panel: Recent Labs  Lab 03/19/23 2030 03/20/23 0530 03/21/23 0131 03/22/23 0604 03/23/23 0938 03/24/23 0559 03/25/23 0539  NA  --  133*  --  130* 132* 133* 137  K  --  3.7  --  4.2 4.0 4.0 3.0*  CL  --  98  --  98 98 98 94*  CO2  --  26  --  26 27 27 31   GLUCOSE  --  116*  --  101* 137* 115* 107*  BUN  --  19  --  13 14 15 11   CREATININE  --  0.62  --  0.74 0.79 0.78 0.62  CALCIUM  --  8.4*  --  8.0* 8.2* 8.1* 8.3*  MG 2.2  --  2.2  --   --   --   --   PHOS 4.2  --  4.1  --   --   --   --    GFR: Estimated Creatinine Clearance: 39.4 mL/min (by C-G formula based on SCr of 0.62 mg/dL). Liver Function Tests: Recent Labs  Lab 03/19/23 1516 03/20/23 0530 03/25/23 0539  AST 39 36 36  ALT 52* 50* 80*  ALKPHOS 64 47 72  BILITOT 0.8 0.9 1.7*  PROT 6.7 5.6* 6.2*  ALBUMIN 4.0 3.1* 3.1*   No results for input(s): "LIPASE", "AMYLASE" in the last 168 hours. No results for input(s): "AMMONIA" in the last 168 hours. Coagulation Profile: No results for input(s): "INR", "PROTIME" in the last 168 hours. Cardiac Enzymes: Recent Labs  Lab 03/19/23 2030  CKTOTAL 48   BNP (last 3 results) No results for input(s): "PROBNP" in the last 8760 hours. HbA1C: No results for input(s): "HGBA1C" in the last 72  hours. CBG: Recent Labs  Lab 03/24/23 2013 03/25/23 0007 03/25/23 0401 03/25/23 0714 03/25/23 1215  GLUCAP 136* 174* 110* 122* 145*   Lipid Profile: No results for input(s): "CHOL", "HDL", "LDLCALC", "TRIG", "CHOLHDL", "LDLDIRECT" in the last 72 hours. Thyroid Function Tests: No results for input(s): "TSH", "T4TOTAL", "FREET4", "T3FREE", "THYROIDAB" in the last 72 hours.  Anemia Panel: No results for input(s): "VITAMINB12", "FOLATE", "FERRITIN", "TIBC", "IRON", "RETICCTPCT" in the last 72 hours. Sepsis Labs: No results for input(s): "PROCALCITON", "LATICACIDVEN" in the last  168 hours.  Recent Results (from the past 240 hour(s))  Urine Culture     Status: Abnormal   Collection Time: 03/23/23  2:25 AM   Specimen: Urine, Random  Result Value Ref Range Status   Specimen Description   Final    URINE, RANDOM Performed at [facility], [address], [place], [place] [address]    Special Requests   Final    NONE Performed at [facility], [address] [place], [place] [address]    Culture >=100,000 COLONIES/mL ESCHERICHIA COLI (A)  Final   Report Status 03/25/2023 FINAL  Final   Organism ID, Bacteria ESCHERICHIA COLI (A)  Final      Susceptibility   Escherichia coli - MIC*    AMPICILLIN <=2 SENSITIVE Sensitive     CEFAZOLIN <=4 SENSITIVE Sensitive     CEFEPIME <=0.12 SENSITIVE Sensitive     CEFTRIAXONE <=0.25 SENSITIVE Sensitive     CIPROFLOXACIN <=0.25 SENSITIVE Sensitive     GENTAMICIN <=1 SENSITIVE Sensitive     IMIPENEM <=0.25 SENSITIVE Sensitive     NITROFURANTOIN <=16 SENSITIVE Sensitive     TRIMETH/SULFA <=20 SENSITIVE Sensitive     AMPICILLIN/SULBACTAM <=2 SENSITIVE Sensitive     PIP/TAZO <=4 SENSITIVE Sensitive     * >=100,000 COLONIES/mL ESCHERICHIA COLI         Radiology Studies: No results found.      Scheduled Meds:  sodium chloride   Intravenous Once   amiodarone  200 mg Oral Daily   atorvastatin  20  mg Oral Daily   azithromycin  500 mg Oral Daily   budesonide (PULMICORT) nebulizer solution  0.25 mg Nebulization BID   escitalopram  5 mg Oral QHS   folic acid  1 mg Oral Daily   guaiFENesin  1,200 mg Oral BID   insulin aspart  0-9 Units Subcutaneous Q4H   ipratropium-albuterol  3 mL Nebulization TID   levothyroxine  125 mcg Oral Q0600   loratadine  10 mg Oral Daily   pantoprazole  40 mg Oral QHS   sodium phosphate  1 enema Rectal UD   Continuous Infusions:  cefTRIAXone (ROCEPHIN)  IV 2 g (03/25/23 1122)      LOS: 6 days    Time spent: 35 minutes    Keashia Haskins A Varnell Orvis, MD [facility]   If 7PM-7AM, please contact night-coverage [email or web address]  03/25/2023, 12:28 PM

## 2023-03-25 NOTE — Consult Note (Signed)
Chief Complaint: Rectal Cancer  Referring Provider(s): Ennever  Supervising Physician: Irish Lack  Patient Status: [facility] - In-pt  History of Present Illness: [name] is a 89 y.o. female with rectal cancer (Lynch syndrome), A-fib on Eliquis, hypertension, tachybradycardia syndrome, and aortic stenosis.  She presented to the ED on 03/19/23 with black stool and rectal bleeding.  Hgb was 8.2 (previously 11.7)  We are asked to place a tunneled cathete with port for chemotherapy.  Chart review = she was febrile yesterday, currently afebrile today.  Urine culture + For E coli.  Concern for Pneumonia as well = CT scan done today but not read yet.  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   Mucous retention cyst of maxillary sinus    Osteoporosis    Pinched nerve In neck   Plantar fasciitis    Stroke [facility]    Vitamin D deficiency     Past Surgical History:  Procedure Laterality Date   APPENDECTOMY     BIOPSY  01/19/2023   Procedure: BIOPSY;  Surgeon: Beverley Fiedler, MD;  Location: [facility];  Service: Gastroenterology;;   BIOPSY  03/21/2023   Procedure: BIOPSY;  Surgeon: Lynann Bologna, MD;  Location: [facility];  Service: Gastroenterology;;   BIOPSY  03/22/2023   Procedure: BIOPSY;  Surgeon: Lynann Bologna, MD;  Location: [facility];  Service: Gastroenterology;;   CHOLECYSTECTOMY     2004   COLONOSCOPY N/A 02/24/2013   Procedure: COLONOSCOPY;  Surgeon: Meryl Dare, MD;  Location: [facility] ENDOSCOPY;  Service: Endoscopy;  Laterality: N/A;    COLONOSCOPY W/ POLYPECTOMY     COLONOSCOPY WITH PROPOFOL N/A 12/03/2017   Procedure: COLONOSCOPY WITH PROPOFOL;  Surgeon: Rachael Fee, MD;  Location: [facility] ENDOSCOPY;  Service: Gastroenterology;  Laterality: N/A;   ESOPHAGOGASTRODUODENOSCOPY     ESOPHAGOGASTRODUODENOSCOPY N/A 02/24/2013   Procedure: ESOPHAGOGASTRODUODENOSCOPY (EGD);  Surgeon: Meryl Dare, MD;  Location: [facility];  Service: Endoscopy;  Laterality: N/A;   ESOPHAGOGASTRODUODENOSCOPY (EGD) WITH PROPOFOL N/A 12/03/2017   Procedure: ESOPHAGOGASTRODUODENOSCOPY (EGD) WITH PROPOFOL;  Surgeon: Rachael Fee, MD;  Location: [facility] ENDOSCOPY;  Service: Gastroenterology;  Laterality: N/A;   ESOPHAGOGASTRODUODENOSCOPY (EGD) WITH PROPOFOL N/A 03/21/2023   Procedure: ESOPHAGOGASTRODUODENOSCOPY (EGD) WITH PROPOFOL;  Surgeon: Lynann Bologna, MD;  Location: [facility]OSCOPY;  Service: Gastroenterology;  Laterality: N/A;   EYE SURGERY     bilateral catracts   FLEXIBLE SIGMOIDOSCOPY N/A 01/19/2023   Procedure: FLEXIBLE SIGMOIDOSCOPY;  Surgeon: Beverley Fiedler, MD;  Location: [facility];  Service: Gastroenterology;  Laterality: N/A;   FLEXIBLE SIGMOIDOSCOPY N/A 03/22/2023   Procedure: FLEXIBLE SIGMOIDOSCOPY;  Surgeon: Lynann Bologna, MD;  Location: [facility];  Service: Gastroenterology;  Laterality: N/A;   HOT HEMOSTASIS N/A 03/21/2023   Procedure: HOT HEMOSTASIS (ARGON PLASMA COAGULATION/BICAP);  Surgeon: Lynann Bologna, MD;  Location: [facility];  Service: Gastroenterology;  Laterality: N/A;    Allergies: Prednisone and Aggrenox [aspirin-dipyridamole er]  Medications: Prior to Admission medications  Medication Sig Start Date End Date Taking? Authorizing Provider  acetaminophen (TYLENOL) 325 MG tablet Take 2 tablets (650 mg total) by mouth every 6 (six) hours as needed for mild pain (or Fever >/= 101). 01/28/23  Yes Azucena Fallen, MD  ALPRAZolam Prudy Feeler) 0.5 MG tablet Take 0.5 mg by mouth 3 (three) times daily as needed for anxiety. For  anxiety   Yes [provider]  amiodarone (PACERONE) 200 MG tablet Take 1 tablet (200 mg total) by mouth daily. DO NOT START THIS MEDICATION UNTIL PREVIOUS DOSE (400mg ) HAS BEEN COMPLETED Patient taking differently: Take 200 mg by mouth daily. 02/05/23  Yes Azucena Fallen, MD  amLODipine (NORVASC) 5 MG tablet Take 1.5 tablets (7.5 mg total) by mouth daily. 02/26/23 05/27/23 Yes Weaver, Scott T, PA-C  apixaban (ELIQUIS) 5 MG TABS tablet Take 1 tablet (5 mg total) by mouth 2 (two) times daily. 02/27/23  Yes Weaver, Scott T, PA-C  atorvastatin (LIPITOR) 20 MG tablet Take 20 mg by mouth daily.   Yes [provider]  capecitabine (XELODA) 500 MG tablet Take 3 tablets (1,500 mg total) by mouth 2 (two) times daily after a meal. Take within 30 minutes after meals. Take for 14 days on, 7 days off. Repeat every 21 days. 02/09/23  Yes Josph Macho, MD  cholecalciferol (VITAMIN D3) 25 MCG (1000 UNIT) tablet Take 1,000 Units by mouth daily.   Yes [provider]  cyanocobalamin 1000 MCG tablet Take 1 tablet (1,000 mcg total) by mouth daily. 01/28/23  Yes Azucena Fallen, MD  escitalopram (LEXAPRO) 5 MG tablet Take 5 mg by mouth at bedtime.   Yes [provider]  famotidine (PEPCID) 20 MG tablet Take 20 mg by mouth daily.   Yes [provider]  feeding supplement (ENSURE ENLIVE / ENSURE PLUS) LIQD Take 237 mLs by mouth 3 (three) times daily with meals. Patient taking differently: Take 237 mLs by mouth 3 (three) times daily as needed (nutrition). 01/28/23  Yes Azucena Fallen, MD  fluticasone [facility]) 50 MCG/ACT nasal spray Place 2 sprays into both nostrils daily. Patient taking differently: Place 2 sprays into both nostrils daily as needed for allergies. 01/28/23  Yes Azucena Fallen, MD  guaiFENesin-dextromethorphan (ROBITUSSIN DM) 100-10 MG/5ML syrup Take 10 mLs by mouth every 4 (four) hours as needed for cough. 01/28/23  Yes Azucena Fallen, MD   HYDROcodone-acetaminophen (NORCO/VICODIN) 5-325 MG tablet Take 1-2 tablets by mouth every 4 (four) hours as needed for moderate pain. 01/28/23  Yes Azucena Fallen, MD  levothyroxine (SYNTHROID) 125 MCG tablet Take 125 mcg by mouth daily before breakfast.   Yes [provider]  loratadine (CLARITIN) 10 MG tablet Take 1 tablet (10 mg total) by mouth daily. 01/28/23  Yes Azucena Fallen, MD  methocarbamol (ROBAXIN) 500 MG tablet Take 1 tablet (500 mg total) by mouth every 8 (eight) hours as needed for muscle spasms (abd pain). 01/28/23  Yes Azucena Fallen, MD  polyethylene glycol (MIRALAX / GLYCOLAX) 17 g packet Take 17 g by mouth daily as needed for mild constipation. Patient taking differently: Take 17 g by mouth at bedtime. 01/28/23  Yes Azucena Fallen, MD  potassium chloride SA (K-DUR,KLOR-CON) 20 MEQ tablet Take 20 mEq by mouth daily.   Yes [provider]  prochlorperazine (COMPAZINE) 10 MG tablet Take 1 tablet (10 mg total) by mouth every 6 (six) hours as needed for nausea or vomiting. 02/08/23  Yes Ennever, Rose Phi, MD  lidocaine-prilocaine (EMLA) cream Apply to affected area once 02/08/23   Josph Macho, MD     Family History  Problem Relation Age of Onset   Diabetes Other    Colon cancer Son    Esophageal cancer Neg Hx    Stomach cancer Neg Hx     Social History   Socioeconomic History   Marital status: Widowed    Spouse name: Not on file   Number of children: 3   Years of education: Not on file   Highest education level: Not on file  Occupational History   Occupation: retired  Tobacco Use   Smoking status: Former    Current packs/day: 0.50    Types: Cigarettes   Smokeless tobacco: Never   Tobacco comments:    Quit about 30 years ago  Vaping Use   Vaping status: Never Used  Substance and Sexual Activity   Alcohol use: No   Drug use: No   Sexual activity: Not Currently  Other Topics Concern   Not on file  Social History  Narrative   Not on file   Social Determinants of Health   Financial Resource Strain: Medium Risk (02/23/2023)   Overall Financial Resource Strain (CARDIA)    Difficulty of Paying Living Expenses: Somewhat hard  Food Insecurity: No Food Insecurity (03/20/2023)   Hunger Vital Sign    Worried About Running Out of Food in the Last Year: Never true    Ran Out of Food in the Last Year: Never true  Transportation Needs: No Transportation Needs (03/20/2023)   PRAPARE - Administrator, Civil Service (Medical): No    Lack of Transportation (Non-Medical): No  Physical Activity: Not on file  Stress: Not on file  Social Connections: Not on file     Review of Systems: A 12 point ROS discussed and pertinent positives are indicated in the HPI above.  All other systems are negative.   Vital Signs: BP (!) 142/59 (BP Location: Right Arm)   Pulse 73   Temp 98.7 F (37.1 C) (Oral)   Resp 16   Ht 5' (1.524 m)   Wt 149 lb 9.6 oz (67.9 kg)   SpO2 96%   BMI 29.22 kg/m   Advance Care Plan: The advanced care place/surrogate decision maker was discussed at the time of visit and the patient did not wish to discuss or was not able to name a surrogate decision maker or provide an advance care plan.  Physical Exam Vitals reviewed.  Constitutional:      Comments: Elderly, looks pretty good for her age. Sitting up in a chair.  Cardiovascular:     Rate and Rhythm: Normal rate and regular rhythm.  Pulmonary:     Effort: Pulmonary effort is normal. No respiratory distress.  Abdominal:     Palpations: Abdomen is soft.  Skin:    General: Skin is warm and dry.  Neurological:     General: No focal deficit present.     Mental Status: She is alert and oriented to person, place, and time.  Psychiatric:        Mood and Affect: Mood normal.        Behavior: Behavior normal.        Thought Content: Thought content normal.        Judgment: Judgment normal.     Imaging: DG Chest 2 View  Result  Date: 03/23/2023 CLINICAL DATA:  Wheezing. EXAM: CHEST - 2 VIEW COMPARISON:  March 19, 2023. FINDINGS:  Stable cardiomediastinal silhouette. Stable nodular density is noted laterally in the right upper lobe. Immediately below it is a new opacity concerning for possible pneumonia. Left lung is clear. Bony thorax is unremarkable. IMPRESSION: Stable nodular density seen laterally in right upper lobe as seen on prior exam and concerning for nodule as noted on prior CT scan of Jan 20, 2023. However, new opacity is seen inferior to this nodule in right upper lobe concerning for possible pneumonia. Electronically Signed   By: Lupita Raider M.D.   On: 03/23/2023 14:40   CT ANGIO GI BLEED  Result Date: 03/20/2023 CLINICAL DATA:  Patient with history of colorectal cancer, had a positive occult fecal blood test at the oncologists office today. Being admitted for further workup. Hemoglobin was 8.2. EXAM: CTA ABDOMEN AND PELVIS WITHOUT AND WITH CONTRAST TECHNIQUE: Multidetector CT imaging of the abdomen and pelvis was performed using the standard protocol during bolus administration of intravenous contrast. Multiplanar reconstructed images and MIPs were obtained and reviewed to evaluate the vascular anatomy. RADIATION DOSE REDUCTION: This exam was performed according to the departmental dose-optimization program which includes automated exposure control, adjustment of the mA and/or kV according to patient size and/or use of iterative reconstruction technique. CONTRAST:  OMNIPAQUE IOHEXOL 350 MG/ML SOLN COMPARISON:  CT abdomen pelvis with IV contrast 01/17/2023, CTA chest 01/20/2023. FINDINGS: VASCULAR Aorta: Heavily calcified. Nonaneurysmal fusiform infrarenal ectasia seen to 2.2 cm. No critical stenosis in the aorta. Celiac: There are ostial calcific plaques superiorly. This causes as much as a 50% vessel origin stenosis. There is mild poststenotic ectasia without aneurysm, dissection or further stenosis. No branch  occlusion is seen. SMA: There are ostial wall plaques, additional plaque just proximal to the inflection point, but no flow-limiting stenosis. No branch occlusion. Renals: 2 arteries supply each kidney. On the left there is a very small accessory artery to the upper pole, and a dominant artery arising just inferior to it. The accessory artery is widely patent. The dominant artery has nonstenosing calcific ostial plaques but is widely patent. On the right, the dominant artery arises first. There are ostial wall plaques anteriorly but they are nonstenosing. The smaller lower pole artery arises from the right anterior aortic wall 1.5 cm distal to this and is widely patent. There are no right renal artery branch occlusions, as far as visualized. IMA: Widely patent.  No branch occlusion. Inflow: There are bilateral heavily calcified common iliac arteries with again noted thrombotic occlusion over most of the right common iliac artery and up to 80% calcific stenosis in mid left common iliac artery. Stenotic flow is visible in the distal right common iliac artery. 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 There are patchy calcifications in the internal iliac arteries without flow-limiting narrowing. The external iliac arteries are tortuous, both showing mild ostial plaques but neither demonstrating stenosis or further plaques. Proximal Outflow: Bilateral common femoral and visualized portions of the superficial and profunda femoral arteries are patent without evidence of aneurysm, dissection, vasculitis or significant stenosis. There are mild nonstenosing calcific plaques in the common femoral arteries. Veins: Patent. Review of the MIP images confirms the above findings. NON-VASCULAR Lower chest: The heart is normal slightly enlarged. There are calcifications in the mitral ring, right coronary artery. The noncontrast imaging demonstrates low-density intraventricular blood  pool compared to the myocardium consistent with anemia. Small hiatal hernia.  Visualized lung bases are clear. Hepatobiliary: 19 cm in length mildly  steatotic liver. No mass enhancement. Old cholecystectomy. Stable mild biliary prominence. Pancreas: No abnormality. Spleen: No mass enhancement.  No splenomegaly. Adrenals/Urinary Tract: There is no adrenal mass. Both kidneys enhance homogeneously except for again noted 1.3 cm posterior lower pole right renal cyst with a Hounsfield density of 20. No follow-up imaging is recommended. There is no urinary stone or obstruction. There is trace air in the anterior bladder lumen which could be due to instrumentation, catheterization or infectious process, but there is no bladder thickening. Stomach/Bowel: No contrast extravasation is seen into the bowel. Mild thickened folds are again noted in the stomach and jejunum but without inflammatory changes or small-bowel obstruction. An appendix is not seen in this patient. There are uncomplicated sigmoid diverticula. The large bowel wall is otherwise unremarkable except possibly at the anorectal junction where there may be eccentric wall thickening to the left and posteriorly. Direct visualization is recommended. Lymphatic: No appreciable adenopathy. Reproductive: Uterus and bilateral adnexa are unremarkable. Other: Small umbilical fat hernia. No incarcerated hernia. No free air, free fluid or free hemorrhage. Musculoskeletal: Osteopenia and degenerative change lumbar spine. There is advanced L4-5 facet hypertrophy with grade 1 spondylolisthesis and severe acquired spinal stenosis. Additional advanced facet hypertrophy at L5-S1 with grade 1 spondylolisthesis without significant spinal stenosis. There is thoracic spondylosis. No acute fractures. IMPRESSION: 1. Heavily calcified aorta with nonaneurysmal ectasia in the infrarenal segment to 2.2 cm. 2. 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 There is reconstitution in the distal right common iliac artery 3. 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 4. No other significant visceral artery stenoses. 5. No visible contrast extravasation into the bowel. 6. Possible eccentric wall thickening at the anorectal junction to the left and posteriorly. Direct visualization is recommended. 7. Trace air in the anterior bladder lumen which could be due to instrumentation, catheterization or infectious process, but there is no bladder thickening. 8. Mildly thickened folds in the stomach and jejunum but without inflammatory changes or small-bowel obstruction. Findings could be due to gastroenteritis or nondistention. 9. Mild hepatic steatosis. 10. Small hiatal hernia. 11. Umbilical fat hernia. 12. Osteopenia and degenerative change. 13. L4-5 severe acquired spinal stenosis. Electronically Signed   By: Almira Bar M.D.   On: 03/20/2023 00:30   DG Chest 2 View  Result Date: 03/20/2023 CLINICAL DATA:  Wheezing. EXAM: CHEST - 2 VIEW COMPARISON:  Chest radiograph 01/26/2023.  Chest CT 01/20/2023 FINDINGS: Chronic bronchial thickening. Right upper lobe nodular opacity in the periphery is more peripherally located than nodule on prior CT. No new airspace disease. No pleural fluid. The heart is normal in size. Aortic atherosclerosis. No pneumothorax. On limited assessment, no acute osseous findings. IMPRESSION: 1. Chronic bronchial thickening. 2. Right upper lobe nodular opacity is more peripherally located than nodule on prior CT. Unclear if this is the same nodule or a new nodular density. Electronically Signed   By: Narda Rutherford M.D.   On: 03/20/2023 00:01    Labs:  CBC: Recent Labs    03/22/23 0604 03/23/23 0938 03/24/23 0559 03/25/23 0539  WBC 5.3 7.8 7.0 7.3  HGB 8.5* 8.4* 8.2* 11.8*  HCT 26.6* 26.4* 25.8* 35.6*  PLT 197 243 232 224    COAGS: No results for input(s):  "INR", "APTT" in the last 8760 hours.  BMP: Recent Labs    03/22/23 0604 03/23/23 0938 03/24/23 0559 03/25/23 0539  NA  130* 132* 133* 137  K 4.2 4.0 4.0 3.0*  CL 98 98 98 94*  CO2 26 27 27 31   GLUCOSE 101* 137* 115* 107*  BUN 13 14 15 11   CALCIUM 8.0* 8.2* 8.1* 8.3*  CREATININE 0.74 0.79 0.78 0.62  GFRNONAA >60 >60 >60 >60    LIVER FUNCTION TESTS: Recent Labs    03/09/23 1241 03/19/23 1516 03/20/23 0530 03/25/23 0539  BILITOT 0.8 0.8 0.9 1.7*  AST 29 39 36 36  ALT 34 52* 50* 80*  ALKPHOS 64 64 47 72  PROT 7.1 6.7 5.6* 6.2*  ALBUMIN 4.3 4.0 3.1* 3.1*    TUMOR MARKERS: Recent Labs    03/19/23 1516  CEA 1.04    Assessment and Plan:  Rectal cancer - Needs Port A Cath for chemotherapy.  Urine culture positive and CT scan pending to evaluate pneumonia.  Will proceed with placement of a Port A Cath when she is clear from an infection standpoint.  I have made Dr. Myna Hidalgo aware and he will consider a PICC placement if chemotherapy needs to be started before we can get the [facility] placed.  She would like for the Port to be placed as inpatient given her advanced age and difficulty getting around.  We will try our best to accommodate this request.  Risks and benefits of image guided port-a-catheter placement was discussed with the patient including, but not limited to bleeding, infection, pneumothorax, or fibrin sheath development and need for additional procedures.  All of the patient's questions were answered, patient is agreeable to proceed. Consent signed and in chart.  Thank you for allowing our service to participate in Sharilynn R Scaduto 's care.  Electronically Signed: Gwynneth Macleod, PA-C   03/25/2023, 11:22 AM      I spent a total of 40 Minutes  in face to face in clinical consultation, greater than 50% of which was counseling/coordinating care for [facility] placement.

## 2023-03-25 NOTE — Plan of Care (Signed)

## 2023-03-26 ENCOUNTER — Inpatient Hospital Stay (HOSPITAL_COMMUNITY): Payer: Medicare PPO

## 2023-03-26 DIAGNOSIS — R7401 Elevation of levels of liver transaminase levels: Secondary | ICD-10-CM | POA: Diagnosis not present

## 2023-03-26 DIAGNOSIS — Z7901 Long term (current) use of anticoagulants: Secondary | ICD-10-CM | POA: Diagnosis not present

## 2023-03-26 DIAGNOSIS — J189 Pneumonia, unspecified organism: Secondary | ICD-10-CM | POA: Diagnosis not present

## 2023-03-26 DIAGNOSIS — N3000 Acute cystitis without hematuria: Secondary | ICD-10-CM

## 2023-03-26 DIAGNOSIS — K922 Gastrointestinal hemorrhage, unspecified: Secondary | ICD-10-CM | POA: Diagnosis not present

## 2023-03-26 HISTORY — PX: IR IMAGING GUIDED PORT INSERTION: IMG5740

## 2023-03-26 LAB — TYPE AND SCREEN
ABO/RH(D): O POS
Antibody Screen: NEGATIVE
Unit division: 0
Unit division: 0

## 2023-03-26 LAB — CBC WITH DIFFERENTIAL/PLATELET
Abs Immature Granulocytes: 0.02 10*3/uL (ref 0.00–0.07)
Basophils Absolute: 0 10*3/uL (ref 0.0–0.1)
Basophils Relative: 1 %
Eosinophils Absolute: 0.2 10*3/uL (ref 0.0–0.5)
Eosinophils Relative: 3 %
HCT: 35.5 % — ABNORMAL LOW (ref 36.0–46.0)
Hemoglobin: 11.5 g/dL — ABNORMAL LOW (ref 12.0–15.0)
Immature Granulocytes: 0 %
Lymphocytes Relative: 17 %
Lymphs Abs: 1 10*3/uL (ref 0.7–4.0)
MCH: 31.3 pg (ref 26.0–34.0)
MCHC: 32.4 g/dL (ref 30.0–36.0)
MCV: 96.7 fL (ref 80.0–100.0)
Monocytes Absolute: 0.7 10*3/uL (ref 0.1–1.0)
Monocytes Relative: 12 %
Neutro Abs: 3.7 10*3/uL (ref 1.7–7.7)
Neutrophils Relative %: 67 %
Platelets: 247 10*3/uL (ref 150–400)
RBC: 3.67 MIL/uL — ABNORMAL LOW (ref 3.87–5.11)
RDW: 21.8 % — ABNORMAL HIGH (ref 11.5–15.5)
WBC: 5.6 10*3/uL (ref 4.0–10.5)
nRBC: 0 % (ref 0.0–0.2)

## 2023-03-26 LAB — COMPREHENSIVE METABOLIC PANEL
ALT: 77 U/L — ABNORMAL HIGH (ref 0–44)
AST: 49 U/L — ABNORMAL HIGH (ref 15–41)
Albumin: 2.9 g/dL — ABNORMAL LOW (ref 3.5–5.0)
Alkaline Phosphatase: 66 U/L (ref 38–126)
Anion gap: 8 (ref 5–15)
BUN: 12 mg/dL (ref 8–23)
CO2: 28 mmol/L (ref 22–32)
Calcium: 8.3 mg/dL — ABNORMAL LOW (ref 8.9–10.3)
Chloride: 97 mmol/L — ABNORMAL LOW (ref 98–111)
Creatinine, Ser: 0.67 mg/dL (ref 0.44–1.00)
GFR, Estimated: >60 mL/min (ref >60)
Glucose, Bld: 108 mg/dL — ABNORMAL HIGH (ref 70–99)
Potassium: 3.5 mmol/L (ref 3.5–5.1)
Sodium: 133 mmol/L — ABNORMAL LOW (ref 135–145)
Total Bilirubin: 1.6 mg/dL — ABNORMAL HIGH (ref 0.3–1.2)
Total Protein: 5.7 g/dL — ABNORMAL LOW (ref 6.5–8.1)

## 2023-03-26 LAB — BPAM RBC
Blood Product Expiration Date: 202408102359
Blood Product Expiration Date: 202408102359
ISSUE DATE / TIME: 202407131344
ISSUE DATE / TIME: 202407131637
Unit Type and Rh: 5100
Unit Type and Rh: 5100

## 2023-03-26 LAB — SURGICAL PATHOLOGY

## 2023-03-26 LAB — GLUCOSE, CAPILLARY
Glucose-Capillary: 102 mg/dL — ABNORMAL HIGH (ref 70–99)
Glucose-Capillary: 105 mg/dL — ABNORMAL HIGH (ref 70–99)
Glucose-Capillary: 109 mg/dL — ABNORMAL HIGH (ref 70–99)
Glucose-Capillary: 110 mg/dL — ABNORMAL HIGH (ref 70–99)
Glucose-Capillary: 142 mg/dL — ABNORMAL HIGH (ref 70–99)
Glucose-Capillary: 164 mg/dL — ABNORMAL HIGH (ref 70–99)

## 2023-03-26 MED ORDER — ONDANSETRON HCL 4 MG/2ML IJ SOLN
4.0000 mg | Freq: Once | INTRAMUSCULAR | Status: AC
  Administered 2023-03-26: 4 mg via INTRAVENOUS

## 2023-03-26 MED ORDER — MIDAZOLAM HCL 2 MG/2ML IJ SOLN
INTRAMUSCULAR | Status: AC
  Filled 2023-03-26: qty 2

## 2023-03-26 MED ORDER — FENTANYL CITRATE (PF) 100 MCG/2ML IJ SOLN
INTRAMUSCULAR | Status: AC
  Filled 2023-03-26: qty 2

## 2023-03-26 MED ORDER — LIDOCAINE-EPINEPHRINE 1 %-1:100000 IJ SOLN
20.0000 mL | Freq: Once | INTRAMUSCULAR | Status: AC
  Administered 2023-03-26: 19 mL via INTRADERMAL
  Filled 2023-03-26: qty 20

## 2023-03-26 MED ORDER — FLUMAZENIL 0.5 MG/5ML IV SOLN
INTRAVENOUS | Status: AC
  Filled 2023-03-26: qty 5

## 2023-03-26 MED ORDER — LIDOCAINE-EPINEPHRINE 1 %-1:100000 IJ SOLN
INTRAMUSCULAR | Status: AC
  Filled 2023-03-26: qty 1

## 2023-03-26 MED ORDER — PROCHLORPERAZINE EDISYLATE 10 MG/2ML IJ SOLN
5.0000 mg | Freq: Once | INTRAMUSCULAR | Status: AC
  Administered 2023-03-26: 5 mg via INTRAVENOUS
  Filled 2023-03-26: qty 2

## 2023-03-26 MED ORDER — NALOXONE HCL 0.4 MG/ML IJ SOLN
INTRAMUSCULAR | Status: AC
  Filled 2023-03-26: qty 1

## 2023-03-26 MED ORDER — PANTOPRAZOLE SODIUM 40 MG PO TBEC
40.0000 mg | DELAYED_RELEASE_TABLET | Freq: Two times a day (BID) | ORAL | Status: DC
  Administered 2023-03-26 – 2023-03-30 (×9): 40 mg via ORAL
  Filled 2023-03-26 (×9): qty 1

## 2023-03-26 MED ORDER — FENTANYL CITRATE (PF) 100 MCG/2ML IJ SOLN
INTRAMUSCULAR | Status: AC | PRN
  Administered 2023-03-26 (×2): 25 ug via INTRAVENOUS

## 2023-03-26 MED ORDER — MIDAZOLAM HCL 2 MG/2ML IJ SOLN
INTRAMUSCULAR | Status: AC | PRN
  Administered 2023-03-26 (×2): .5 mg via INTRAVENOUS

## 2023-03-26 MED ORDER — CHLORHEXIDINE GLUCONATE CLOTH 2 % EX PADS
6.0000 | MEDICATED_PAD | Freq: Every day | CUTANEOUS | Status: DC
  Administered 2023-03-27 – 2023-03-30 (×5): 6 via TOPICAL

## 2023-03-26 NOTE — Progress Notes (Signed)
Over the weekend, now examined with Ms. Kuba.  She did have some nausea this morning.  I do not know if this is from the E. coli urinary tract infection.  She did have the CT scan over the weekend.  Looks like there was some bi-lobar pneumonia.  I am not sure if she is going to have the Port-A-Cath put in today.  It would be nice if this could be put in.  She has been afebrile.  She does not have any obvious bleeding.  I think she got some blood over the weekend.  She also may have gotten some iron over the weekend.   Her labs today show sodium 133.  Potassium 3.5.  BUN 12 creatinine 0.67.  Calcium 8.3 with an albumin of 2.9.  Her LFTs are little bit elevated.  Her white cell count is 5.6.  Hemoglobin 11.5.  Platelet count 247,000.  MCV is 97.  She is out of bed.  She has had in the chair most of yesterday.  Her vital signs show temperature of 98.4.  Pulse 75.  Blood pressure 139/55.  Her head and neck exam shows no scleral icterus.  She has no oral lesions.  There is no adenopathy in the neck.  Lungs are clear and bilaterally.  Cardiac exam regular rate and rhythm.  She has no murmurs.  I do not hear any obvious atrial fibrillation.  Abdomen is soft.  Bowel sounds are decreased but present.  She has no obvious fluid wave.  There is no palpable liver or spleen tip.  Extremity shows no clubbing, cyanosis or edema.  The skin exam does show some scattered ecchymoses.  She has a E. coli urinary tract infection.  She has the pneumonia.  I have to believe that the 2 are connected.  She is on Rocephin.  We have see what the sensitivities are to the E. coli.  Glad that she responded to the transfusion.  Her hemoglobin is holding steady which is nice to see.  I will have the Protonix to be given twice a day.  We will see if this may help with some of this nausea that she has.  Again, I am unsure if the Port-A-Cath will be put in today.  She has not had a temperature for a couple days.  Her blood counts  are all right.  I would think that if IR could put the Port-A-Cath and that they would.  Thankfully, her GI bleeding has stopped.  Once she is discharged, she will continue on her Xeloda.    [name], MD   Hebrews 12:12

## 2023-03-26 NOTE — Progress Notes (Signed)
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  She reported lightheadedness.  She was evaluated at her oncologist office and she had very small external hemorrhoid, occult blood was positive. Hb 8.7  Assessment & Plan:   Principal Problem:   Upper GI bleed Active Problems:   Symptomatic anemia   Hyperlipidemia   Hypothyroidism   Diabetes mellitus without complication (HCC)   HTN (hypertension)   Rectal cancer (HCC)   Paroxysmal atrial fibrillation (HCC)   Wheezing   GI bleed  GI Bleed: Presents with melena. In setting of rectal cancer, mass and eliquis.  -She has received total 2 units PRBC this hospitalization.  -On Protonix  -Underwent Endoscopy  7/10: Small hiatal hernia. Gastritis. Biopsied. A few benign gastric polyps. 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 Treated with argon plasma coagulation (APC). -CTA; negative for active bleeding.  -Underwent sigmoidoscopy 7/11: Showed distal rectal mass the size appears to be smaller than the recent sigmoidoscopy/2024.  However very friable mass and would  bleed easily. - hold off on Eliquis -Hb stable, melena resolving.  - GI, oncology consulted, appreciate recs - IR consulted for port-a-cath placement  Symptomatic Anemia:  Received two unit PRBC during this hospitalization. Last transfusion was 7/13. Hgb Remaining stable 11.5 today.   Multilobular PNA: as seen on CT chest - wean oxygen as tolerated. No baseline requirement Schedule Nebulizer again.  Incentive spirometry.  Continue with  Mucinex Continue with ceftriaxone and Azithro.    UTI; UA with positive nitrates. Continue with  IV ceftriaxone Urine culture; growing E coli.   Hyperlipidemia: Continue with Lipitor  Diabetes : Continue with a sliding scale insulin  HTN: Hold blood pressure medication  Hypothyroidism: Continue with Synthroid  Rectal cancer:  Follow-up with Dr. Myna Hidalgo.  PAF; Continue to hold Eliquis in the setting of GI bleed, plan to hold indefinitely for now. Needs to follow up with Dr Melburn Popper out patient  Continue with amiodarone  Estimated body mass index is 29.22 kg/m as calculated from the following:   Height as of this encounter: 5' (1.524 m).   Weight as of this encounter: 67.9 kg.   DVT prophylaxis: SCD Code Status: Full code Family Communication: Son Over phone. 07/13 Disposition Plan:  Status is: Inpatient Remains inpatient appropriate because: management of GI bleed.    Consultants:  GI Oncology IR  Procedures:  Port-a-cath placement 7/15 Blood transfusions  Antimicrobials: CTX, azithromycin   Subjective: Pt reports feeling well today. Denies dyspnea while resting. Denies dysuria. She states she is hungry while she is being kept NPO for procedure today  Objective: Vitals:   03/25/23 2005 03/25/23 2008 03/25/23 2010 03/26/23 0426  BP: (!) 143/53   (!) 139/55  Pulse: 78   75  Resp: 16   16  Temp: 98.2 F (36.8 C)   98.4 F (36.9 C)  TempSrc: Oral   Oral  SpO2: 96% 97% 97% 97%  Weight:      Height:        Intake/Output Summary (Last 24 hours) at 03/26/2023 0754 Last data filed at 03/25/2023 1823 Gross per 24 hour  Intake 412.67 ml  Output --  Net 412.67 ml   Ceasar Mons  Weights   03/19/23 1655  Weight: 67.9 kg    Examination: General exam: NAD Respiratory system: No wheezing. BL air movement, BL ronchus.  Cardiovascular system: S 1, S 2 RRR Gastrointestinal system: BS present, soft, nt Central nervous system: Alert, follows command Extremities: no edema  Data Reviewed: I have personally reviewed  following labs and imaging studies  CBC: Recent Labs  Lab 03/19/23 1516 03/19/23 2125 03/22/23 0604 03/23/23 0938 03/24/23 0559 03/25/23 0539 03/26/23 0533  WBC 7.0   < > 5.3 7.8 7.0 7.3 5.6  NEUTROABS 3.7  --   --   --   --  5.4 3.7  HGB 8.2*   < > 8.5* 8.4* 8.2* 11.8* 11.5*  HCT 26.0*   < > 26.6* 26.4* 25.8* 35.6* 35.5*  MCV 100.0   < > 100.4* 102.7* 102.8* 94.2 96.7  PLT 247   < > 197 243 232 224 247   < > = values in this interval not displayed.   Basic Metabolic Panel: Recent Labs  Lab 03/19/23 2030 03/20/23 0530 03/21/23 0131 03/22/23 0604 03/23/23 6387 03/24/23 0559 03/25/23 0539 03/26/23 0533  NA  --    < >  --  130* 132* 133* 137 133*  K  --    < >  --  4.2 4.0 4.0 3.0* 3.5  CL  --    < >  --  98 98 98 94* 97*  CO2  --    < >  --  26 27 27 31 28   GLUCOSE  --    < >  --  101* 137* 115* 107* 108*  BUN  --    < >  --  13 14 15 11 12   CREATININE  --    < >  --  0.74 0.79 0.78 0.62 0.67  CALCIUM  --    < >  --  8.0* 8.2* 8.1* 8.3* 8.3*  MG 2.2  --  2.2  --   --   --   --   --   PHOS 4.2  --  4.1  --   --   --   --   --    < > = values in this interval not displayed.   GFR: Estimated Creatinine Clearance: 39.4 mL/min (by C-G formula based on SCr of 0.67 mg/dL). Liver Function Tests: Recent Labs  Lab 03/19/23 1516 03/20/23 0530 03/25/23 0539 03/26/23 0533  AST 39 36 36 49*  ALT 52* 50* 80* 77*  ALKPHOS 64 47 72 66  BILITOT 0.8 0.9 1.7* 1.6*  PROT 6.7 5.6* 6.2* 5.7*  ALBUMIN 4.0 3.1* 3.1* 2.9*    Cardiac Enzymes: Recent Labs  Lab 03/19/23 2030  CKTOTAL 48   CBG: Recent Labs  Lab 03/25/23 1621 03/25/23 2006 03/26/23 0025 03/26/23 0426 03/26/23 0721  GLUCAP 111* 186* 109* 110* 105*   Recent Results (from the past 240 hour(s))  Urine Culture     Status: Abnormal   Collection Time: 03/23/23  2:25 AM   Specimen: Urine, Random  Result Value Ref Range Status   Specimen Description   Final    URINE, RANDOM Performed at [facility], [address] [place], [place] [address]    Special Requests   Final    NONE Performed at [facility], [address], [place], [place] [address]    Culture >=100,000 COLONIES/mL ESCHERICHIA COLI (A)  Final   Report Status 03/25/2023 FINAL  Final  Organism ID, Bacteria ESCHERICHIA COLI (A)  Final      Susceptibility   Escherichia coli - MIC*    AMPICILLIN <=2 SENSITIVE Sensitive     CEFAZOLIN <=4 SENSITIVE Sensitive     CEFEPIME <=0.12 SENSITIVE Sensitive     CEFTRIAXONE <=0.25 SENSITIVE Sensitive     CIPROFLOXACIN <=0.25 SENSITIVE Sensitive     GENTAMICIN <=1 SENSITIVE Sensitive     IMIPENEM <=0.25 SENSITIVE Sensitive     NITROFURANTOIN <=16 SENSITIVE Sensitive     TRIMETH/SULFA <=20 SENSITIVE Sensitive     AMPICILLIN/SULBACTAM <=2 SENSITIVE Sensitive     PIP/TAZO <=4 SENSITIVE Sensitive     * >=100,000 COLONIES/mL ESCHERICHIA COLI     Radiology Studies: CT CHEST W CONTRAST  Result Date: 03/25/2023 CLINICAL DATA:  Pneumonia EXAM: CT CHEST WITH CONTRAST TECHNIQUE: Multidetector CT imaging of the chest was performed during intravenous contrast administration. RADIATION DOSE REDUCTION: This exam was performed according to the departmental dose-optimization program which includes automated exposure control, adjustment of the mA and/or kV according to patient size and/or use of iterative reconstruction technique. CONTRAST:  75mL OMNIPAQUE IOHEXOL 300 MG/ML  SOLN COMPARISON:  Chest radiograph 03/23/2023 and chest CT 01/20/2023 FINDINGS: Cardiovascular: Coronary, aortic arch, and branch vessel atherosclerotic vascular disease. Mitral and aortic valve calcifications. Mediastinum/Nodes: Subcarinal lymph node 1.4 cm in short axis on image 66 series 2, formerly the same on 01/20/2023. Right hilar lymph node 0.9 cm in short axis on image 65 series 2, stable. Lungs/Pleura: Centrilobular emphysema. Biapical pleuroparenchymal scarring. Patchy airspace opacities most  notable in the right upper lobe but also scattered in the left upper lobe and both lower lobes. Passive atelectasis associated with small bilateral pleural effusions. Peripheral consolidation disproportionate to passive atelectasis in the right lower lobe on image 80 series 2, probably from pneumonia or possibly rounded atelectasis. New 7 by 5 by 4 mm (volume = 70 mm^3) nodule superiorly in the right middle lobe on image 70 series 8, not present on 01/20/2023 hence likely to be inflammatory. Stable 3 mm right middle lobe nodule on image 95 series 8. Upper Abdomen: Abdominal aortic atherosclerosis. Musculoskeletal: Lower thoracic spondylosis. Degenerative glenohumeral arthropathy on the left. IMPRESSION: 1. Patchy airspace opacities in the lungs, most notable in the right upper lobe, compatible with multilobar pneumonia. 2. Small bilateral pleural effusions with associated passive atelectasis. 3. Peripheral consolidation in the right lower lobe disproportionate to passive atelectasis, probably from pneumonia or possibly rounded atelectasis. 4. New 0.7 cm nodule superiorly in the right middle lobe, not present on 01/20/2023, probably inflammatory given the timing. No follow-up needed if patient is low-risk. Non-contrast chest CT can be considered in 12 months if patient is high-risk. This recommendation follows the consensus statement: Guidelines for Management of Incidental Pulmonary Nodules Detected on CT Images: From the Fleischner Society 2017; Radiology 2017; 284:228-243. 5. Coronary atherosclerosis mitral and aortic valve calcifications. Aortic Atherosclerosis (ICD10-I70.0) and Emphysema (ICD10-J43.9). Electronically Signed   By: Gaylyn Rong M.D.   On: 03/25/2023 15:05     Scheduled Meds:  sodium chloride   Intravenous Once   amiodarone  200 mg Oral Daily   atorvastatin  20 mg Oral Daily   azithromycin  500 mg Oral Daily   budesonide (PULMICORT) nebulizer solution  0.25 mg Nebulization BID    escitalopram  5 mg Oral QHS   folic acid  1 mg Oral Daily   guaiFENesin  1,200 mg Oral BID   insulin aspart  0-9 Units Subcutaneous Q4H   ipratropium-albuterol  3 mL Nebulization BID   levothyroxine  125 mcg Oral Q0600   loratadine  10 mg Oral Daily   pantoprazole  40 mg Oral BID   sodium phosphate  1 enema Rectal UD   Continuous Infusions:  cefTRIAXone (ROCEPHIN)  IV Stopped (03/25/23 1152)      LOS: 7 days    Time spent: 35 minutes    Leeroy Bock, MD [facility]   If 7PM-7AM, please contact night-coverage [email or web address]  03/26/2023, 7:54 AM

## 2023-03-26 NOTE — Care Management Important Message (Signed)
Important Message  Patient Details IM Letter given. Name: [name] MRN: [id number] Date of Birth: [birth year]   Medicare Important Message Given:  Yes     [name] 03/26/2023, 12:41 PM

## 2023-03-26 NOTE — Procedures (Signed)
Interventional Radiology Procedure Note  Procedure: Single Lumen Power Port Placement    Access:  Right internal jugular vein  Findings: Catheter tip positioned at cavoatrial junction. Port is ready for immediate use. Left accessed.  Complications: None  EBL: < 10 mL  Recommendations:  - Ok to shower in 24 hours - Do not submerge for 7 days - Routine line care    Marliss Coots, MD

## 2023-03-26 NOTE — Plan of Care (Signed)

## 2023-03-27 DIAGNOSIS — Z7901 Long term (current) use of anticoagulants: Secondary | ICD-10-CM | POA: Diagnosis not present

## 2023-03-27 DIAGNOSIS — K922 Gastrointestinal hemorrhage, unspecified: Secondary | ICD-10-CM | POA: Diagnosis not present

## 2023-03-27 DIAGNOSIS — N3000 Acute cystitis without hematuria: Secondary | ICD-10-CM | POA: Diagnosis not present

## 2023-03-27 DIAGNOSIS — J189 Pneumonia, unspecified organism: Secondary | ICD-10-CM | POA: Diagnosis not present

## 2023-03-27 LAB — COMPREHENSIVE METABOLIC PANEL
ALT: 66 U/L — ABNORMAL HIGH (ref 0–44)
AST: 37 U/L (ref 15–41)
Albumin: 2.9 g/dL — ABNORMAL LOW (ref 3.5–5.0)
Alkaline Phosphatase: 70 U/L (ref 38–126)
Anion gap: 9 (ref 5–15)
BUN: 15 mg/dL (ref 8–23)
CO2: 27 mmol/L (ref 22–32)
Calcium: 8.2 mg/dL — ABNORMAL LOW (ref 8.9–10.3)
Chloride: 97 mmol/L — ABNORMAL LOW (ref 98–111)
Creatinine, Ser: 0.6 mg/dL (ref 0.44–1.00)
GFR, Estimated: >60 mL/min (ref >60)
Glucose, Bld: 96 mg/dL (ref 70–99)
Potassium: 3.6 mmol/L (ref 3.5–5.1)
Sodium: 133 mmol/L — ABNORMAL LOW (ref 135–145)
Total Bilirubin: 1 mg/dL (ref 0.3–1.2)
Total Protein: 5.8 g/dL — ABNORMAL LOW (ref 6.5–8.1)

## 2023-03-27 LAB — GLUCOSE, CAPILLARY
Glucose-Capillary: 100 mg/dL — ABNORMAL HIGH (ref 70–99)
Glucose-Capillary: 124 mg/dL — ABNORMAL HIGH (ref 70–99)
Glucose-Capillary: 127 mg/dL — ABNORMAL HIGH (ref 70–99)
Glucose-Capillary: 175 mg/dL — ABNORMAL HIGH (ref 70–99)
Glucose-Capillary: 225 mg/dL — ABNORMAL HIGH (ref 70–99)
Glucose-Capillary: 95 mg/dL (ref 70–99)

## 2023-03-27 LAB — CBC WITH DIFFERENTIAL/PLATELET
Abs Immature Granulocytes: 0.03 10*3/uL (ref 0.00–0.07)
Basophils Absolute: 0 10*3/uL (ref 0.0–0.1)
Basophils Relative: 1 %
Eosinophils Absolute: 0.2 10*3/uL (ref 0.0–0.5)
Eosinophils Relative: 3 %
HCT: 36.9 % (ref 36.0–46.0)
Hemoglobin: 11.8 g/dL — ABNORMAL LOW (ref 12.0–15.0)
Immature Granulocytes: 1 %
Lymphocytes Relative: 15 %
Lymphs Abs: 0.8 10*3/uL (ref 0.7–4.0)
MCH: 31.5 pg (ref 26.0–34.0)
MCHC: 32 g/dL (ref 30.0–36.0)
MCV: 98.4 fL (ref 80.0–100.0)
Monocytes Absolute: 0.7 10*3/uL (ref 0.1–1.0)
Monocytes Relative: 12 %
Neutro Abs: 4 10*3/uL (ref 1.7–7.7)
Neutrophils Relative %: 68 %
Platelets: 249 10*3/uL (ref 150–400)
RBC: 3.75 MIL/uL — ABNORMAL LOW (ref 3.87–5.11)
RDW: 21.2 % — ABNORMAL HIGH (ref 11.5–15.5)
WBC: 5.8 10*3/uL (ref 4.0–10.5)
nRBC: 0 % (ref 0.0–0.2)

## 2023-03-27 MED ORDER — AZITHROMYCIN 250 MG PO TABS
500.0000 mg | ORAL_TABLET | Freq: Once | ORAL | Status: AC
  Administered 2023-03-27: 500 mg via ORAL
  Filled 2023-03-27: qty 2

## 2023-03-27 MED ORDER — SODIUM CHLORIDE 0.9 % IV SOLN
1.0000 g | INTRAVENOUS | Status: DC
  Administered 2023-03-27: 1 g via INTRAVENOUS
  Filled 2023-03-27: qty 10

## 2023-03-27 MED ORDER — DRONABINOL 2.5 MG PO CAPS
2.5000 mg | ORAL_CAPSULE | Freq: Two times a day (BID) | ORAL | Status: DC
  Administered 2023-03-27 – 2023-03-29 (×6): 2.5 mg via ORAL
  Filled 2023-03-27 (×6): qty 1

## 2023-03-27 MED ORDER — CIPROFLOXACIN HCL 500 MG PO TABS
500.0000 mg | ORAL_TABLET | Freq: Two times a day (BID) | ORAL | Status: DC
  Administered 2023-03-27: 500 mg via ORAL
  Filled 2023-03-27: qty 1

## 2023-03-27 NOTE — Progress Notes (Signed)
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  They presented with black stool, rectal bleeding since the day prior to admission.  She reported lightheadedness.  She was evaluated at her oncologist office and she had very small external hemorrhoid, occult blood was positive. Hb 8.7  Since admission, she received 2 units pRBCs and now was stable hgb. GI was consulted and Underwent Endoscopy 7/10: Small hiatal hernia, Gastritis. Biopsied. A few benign gastric polyps. 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 Treated with argon plasma coagulation (APC). -CTA; negative for active bleeding.  -Underwent sigmoidoscopy 7/11: Showed distal rectal mass the size appears to be smaller than the recent sigmoidoscopy/2024.  However very friable mass and would  bleed easily. Her home eliquis was held. IR consulted and placed a port-a-cath 7/15 for chemotherapy which is tender today and unable to draw labs but per nursing report, was able to administer medications without difficulty.  Assessment & Plan:   Principal Problem:   Upper GI bleed Active Problems:   Symptomatic anemia   Hyperlipidemia   Hypothyroidism   Diabetes mellitus without complication (HCC)   HTN (hypertension)   Rectal cancer (HCC)   Paroxysmal atrial fibrillation (HCC)   Wheezing   GI bleed   Anticoagulated by anticoagulation treatment   Multifocal pneumonia   Acute cystitis without hematuria  GI Bleed: Presents with melena. In setting of rectal cancer, mass and eliquis.  -She has received total 2 units PRBC this hospitalization.  -On Protonix  -Underwent Endoscopy  7/10: Small hiatal hernia. Gastritis.  Biopsied. A few benign gastric polyps. 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 Treated with argon plasma coagulation (APC). -CTA; negative for active bleeding.  -Underwent sigmoidoscopy 7/11: Showed distal rectal mass the size appears to be smaller than the recent sigmoidoscopy/2024.  However very friable mass and would  bleed easily. - hold off on Eliquis -Hb stable, melena resolving.  - Oncology following, appreciate recs  - started on marinol - IR consulted for port-a-cath placement - reassess port-a-cath access and ensure can be drawn from prior to dc  Symptomatic Anemia:  Received two unit PRBC during this hospitalization. Last transfusion was 7/13. Hgb Remaining stable 11.5>11.8 today. No signs of active bleeding  Hypoxic respiratory failure- multifactorial. No baseline oxygen requirement. On 2L today, desaturated to 87% while ambulating today which returned to 92% when placed back on oxygen and resting.  Multilobular PNA: as seen on CT chest. Also new nodule in right middle lobe favored to be inflammatory but warrants repeat imaging in 12 months, per radiology guidelines.  - wean oxygen as tolerated. No baseline requirement.  - Schedule Nebulizer - Incentive spirometry, flutter valve.  - Continue with Mucinex - Continue with ceftriaxone x10 day course and Azithro x5 days - consider discontinuing amiodarone for contribution to lung dysfunction.   UTI; UA with positive nitrates. Continue with IV ceftriaxone Urine culture; growing E coli. Asymptomatic   Hyperlipidemia: discontinue statin due to no demonstrated benefit as primary prevention and life expectancy   Diabetes : Continue with a sliding scale insulin  HTN: Hold blood pressure medication  Hypothyroidism: Continue with Synthroid  Rectal cancer:  Follow-up with Dr. Myna Hidalgo.  PAF: rate controlled. Continue to hold Eliquis in the setting of GI bleed, plan  to hold indefinitely for now. Needs to follow up  with Dr Melburn Popper out patient  Continue with amiodarone  Estimated body mass index is 29.22 kg/m as calculated from the following:   Height as of this encounter: 5' (1.524 m).   Weight as of this encounter: 67.9 kg.  DVT prophylaxis: SCD Code Status: Full code Family Communication: Son Over phone 7/16 Disposition Plan:  Status is: Inpatient Remains inpatient appropriate because: management of GI bleed.   Consultants:  GI Oncology IR  Procedures:  Port-a-cath placement 7/15 Blood transfusions  Antimicrobials: CTX, azithromycin  Subjective: Pt reports feeling well today. Denies dyspnea while resting. Denies dysuria. She states she is hungry while she is being kept NPO for procedure today  Objective: Vitals:   03/26/23 1745 03/26/23 1951 03/26/23 2022 03/27/23 0607  BP: (!) 137/55  139/61 (!) 155/65  Pulse: 74  78 80  Resp: 18  16 16   Temp: 98.2 F (36.8 C)  97.9 F (36.6 C) 98.3 F (36.8 C)  TempSrc:   Oral Oral  SpO2: 96% 96% 97% 94%  Weight:      Height:       No intake or output data in the 24 hours ending 03/27/23 0751  Filed Weights   03/19/23 1655  Weight: 67.9 kg    Examination: General exam: NAD Respiratory system: No wheezing. BL air movement Cardiovascular system: S 1, S 2 RRR Gastrointestinal system: BS present, soft, nt Central nervous system: Alert, follows command Extremities: no edema  Data Reviewed: I have personally reviewed following labs and imaging studies  CBC: Recent Labs  Lab 03/22/23 0604 03/23/23 0938 03/24/23 0559 03/25/23 0539 03/26/23 0533  WBC 5.3 7.8 7.0 7.3 5.6  NEUTROABS  --   --   --  5.4 3.7  HGB 8.5* 8.4* 8.2* 11.8* 11.5*  HCT 26.6* 26.4* 25.8* 35.6* 35.5*  MCV 100.4* 102.7* 102.8* 94.2 96.7  PLT 197 243 232 224 247   Basic Metabolic Panel: Recent Labs  Lab 03/21/23 0131 03/22/23 0604 03/23/23 0938 03/24/23 0559 03/25/23 0539 03/26/23 0533  NA  --  130* 132* 133* 137 133*  K  --  4.2 4.0 4.0 3.0* 3.5   CL  --  98 98 98 94* 97*  CO2  --  26 27 27 31 28   GLUCOSE  --  101* 137* 115* 107* 108*  BUN  --  13 14 15 11 12   CREATININE  --  0.74 0.79 0.78 0.62 0.67  CALCIUM  --  8.0* 8.2* 8.1* 8.3* 8.3*  MG 2.2  --   --   --   --   --   PHOS 4.1  --   --   --   --   --    GFR: Estimated Creatinine Clearance: 39.4 mL/min (by C-G formula based on SCr of 0.67 mg/dL). Liver Function Tests: Recent Labs  Lab 03/25/23 0539 03/26/23 0533  AST 36 49*  ALT 80* 77*  ALKPHOS 72 66  BILITOT 1.7* 1.6*  PROT 6.2* 5.7*  ALBUMIN 3.1* 2.9*    Cardiac Enzymes: No results for input(s): "CKTOTAL", "CKMB", "CKMBINDEX", "TROPONINI" in the last 168 hours.  CBG: Recent Labs  Lab 03/26/23 1229 03/26/23 1626 03/26/23 2027 03/27/23 0001 03/27/23 0347  GLUCAP 102* 164* 142* 175* 124*   Recent Results (from the past 240 hour(s))  Urine Culture     Status: Abnormal   Collection Time: 03/23/23  2:25 AM   Specimen: Urine, Random  Result Value Ref  Range Status   Specimen Description   Final    URINE, RANDOM Performed at [facility], [address], [place], [place] [address]    Special Requests   Final    NONE Performed at [facility], [address] [place], [place] [address]    Culture >=100,000 COLONIES/mL ESCHERICHIA COLI (A)  Final   Report Status 03/25/2023 FINAL  Final   Organism ID, Bacteria ESCHERICHIA COLI (A)  Final      Susceptibility   Escherichia coli - MIC*    AMPICILLIN <=2 SENSITIVE Sensitive     CEFAZOLIN <=4 SENSITIVE Sensitive     CEFEPIME <=0.12 SENSITIVE Sensitive     CEFTRIAXONE <=0.25 SENSITIVE Sensitive     CIPROFLOXACIN <=0.25 SENSITIVE Sensitive     GENTAMICIN <=1 SENSITIVE Sensitive     IMIPENEM <=0.25 SENSITIVE Sensitive     NITROFURANTOIN <=16 SENSITIVE Sensitive     TRIMETH/SULFA <=20 SENSITIVE Sensitive     AMPICILLIN/SULBACTAM <=2 SENSITIVE Sensitive     PIP/TAZO <=4 SENSITIVE Sensitive     * >=100,000 COLONIES/mL  ESCHERICHIA COLI     Radiology Studies: IR IMAGING GUIDED PORT INSERTION  Result Date: 03/26/2023 INDICATION: 89 year old female with history of rectal mass and poor venous access. EXAM: IMPLANTED PORT A CATH PLACEMENT WITH ULTRASOUND AND FLUOROSCOPIC GUIDANCE COMPARISON:  None Available. MEDICATIONS: None. ANESTHESIA/SEDATION: Moderate (conscious) sedation was employed during this procedure. A total of Versed 1 mg and Fentanyl 50 mcg was administered intravenously. Moderate Sedation Time: 14 minutes. The patient's level of consciousness and vital signs were monitored continuously by radiology nursing throughout the procedure under my direct supervision. CONTRAST:  None FLUOROSCOPY TIME:  0.1 mGy COMPLICATIONS: None immediate. PROCEDURE: The procedure, risks, benefits, and alternatives were explained to the patient. Questions regarding the procedure were encouraged and answered. The patient understands and consents to the procedure. The right neck and chest were prepped with chlorhexidine in a sterile fashion, and a sterile drape was applied covering the operative field. Maximum barrier sterile technique with sterile gowns and gloves were used for the procedure. A timeout was performed prior to the initiation of the procedure. Ultrasound was used to examine the jugular vein which was compressible and free of internal echoes. A skin marker was used to demarcate the planned venotomy and port pocket incision sites. Local anesthesia was provided to these sites and the subcutaneous tunnel track with 1% lidocaine with 1:100,000 epinephrine. A small incision was created at the jugular access site and blunt dissection was performed of the subcutaneous tissues. Under ultrasound guidance, the jugular vein was accessed with a 21 ga micropuncture needle and an 0.018" wire was inserted to the superior vena cava. Real-time ultrasound guidance was utilized for vascular access including the acquisition of a permanent  ultrasound image documenting patency of the accessed vessel. A 5 Fr micopuncture set was then used, through which a 0.035" Rosen wire was passed under fluoroscopic guidance into the inferior vena cava. An 8 Fr dilator was then placed over the wire. A subcutaneous port pocket was then created along the upper chest wall utilizing a combination of sharp and blunt dissection. The pocket was irrigated with sterile saline, packed with gauze, and observed for hemorrhage. A single lumen clear view power injectable port was chosen for placement. The 8 Fr catheter was tunneled from the port pocket site to the venotomy incision. The port was placed in the pocket. The external catheter was trimmed to appropriate length. The dilator was exchanged for  an 8 Fr peel-away sheath under fluoroscopic guidance. The catheter was then placed through the sheath and the sheath was removed. Final catheter positioning was confirmed and documented with a fluoroscopic spot radiograph. The port was accessed with a Huber needle, aspirated, and flushed with heparinized saline. The deep dermal layer of the port pocket incision was closed with interrupted 3-0 Vicryl suture. Dermabond was then placed over the port pocket and neck incisions. The patient tolerated the procedure well without immediate post procedural complication. FINDINGS: After catheter placement, the tip lies within the superior cavoatrial junction. The catheter aspirates and flushes normally and is ready for immediate use. IMPRESSION: Successful placement of a power injectable Port-A-Cath via the right internal jugular vein. The catheter is ready for immediate use. Marliss Coots, MD [facility] Radiology Electronically Signed   By: Marliss Coots M.D.   On: 03/26/2023 15:20   CT CHEST W CONTRAST  Result Date: 03/25/2023 CLINICAL DATA:  Pneumonia EXAM: CT CHEST WITH CONTRAST TECHNIQUE: Multidetector CT imaging of the chest was  performed during intravenous contrast administration. RADIATION DOSE REDUCTION: This exam was performed according to the departmental dose-optimization program which includes automated exposure control, adjustment of the mA and/or kV according to patient size and/or use of iterative reconstruction technique. CONTRAST:  75mL OMNIPAQUE IOHEXOL 300 MG/ML  SOLN COMPARISON:  Chest radiograph 03/23/2023 and chest CT 01/20/2023 FINDINGS: Cardiovascular: Coronary, aortic arch, and branch vessel atherosclerotic vascular disease. Mitral and aortic valve calcifications. Mediastinum/Nodes: Subcarinal lymph node 1.4 cm in short axis on image 66 series 2, formerly the same on 01/20/2023. Right hilar lymph node 0.9 cm in short axis on image 65 series 2, stable. Lungs/Pleura: Centrilobular emphysema. Biapical pleuroparenchymal scarring. Patchy airspace opacities most notable in the right upper lobe but also scattered in the left upper lobe and both lower lobes. Passive atelectasis associated with small bilateral pleural effusions. Peripheral consolidation disproportionate to passive atelectasis in the right lower lobe on image 80 series 2, probably from pneumonia or possibly rounded atelectasis. New 7 by 5 by 4 mm (volume = 70 mm^3) nodule superiorly in the right middle lobe on image 70 series 8, not present on 01/20/2023 hence likely to be inflammatory. Stable 3 mm right middle lobe nodule on image 95 series 8. Upper Abdomen: Abdominal aortic atherosclerosis. Musculoskeletal: Lower thoracic spondylosis. Degenerative glenohumeral arthropathy on the left. IMPRESSION: 1. Patchy airspace opacities in the lungs, most notable in the right upper lobe, compatible with multilobar pneumonia. 2. Small bilateral pleural effusions with associated passive atelectasis. 3. Peripheral consolidation in the right lower lobe disproportionate to passive atelectasis, probably from pneumonia or possibly rounded atelectasis. 4. New 0.7 cm nodule  superiorly in the right middle lobe, not present on 01/20/2023, probably inflammatory given the timing. No follow-up needed if patient is low-risk. Non-contrast chest CT can be considered in 12 months if patient is high-risk. This recommendation follows the consensus statement: Guidelines for Management of Incidental Pulmonary Nodules Detected on CT Images: From the Fleischner Society 2017; Radiology 2017; 284:228-243. 5. Coronary atherosclerosis mitral and aortic valve calcifications. Aortic Atherosclerosis (ICD10-I70.0) and Emphysema (ICD10-J43.9). Electronically Signed   By: Gaylyn Rong M.D.   On: 03/25/2023 15:05     Scheduled Meds:  sodium chloride   Intravenous Once   amiodarone  200 mg Oral Daily   atorvastatin  20 mg Oral Daily   budesonide (PULMICORT) nebulizer solution  0.25 mg Nebulization BID   Chlorhexidine Gluconate Cloth  6 each Topical Daily  ciprofloxacin  500 mg Oral BID   dronabinol  2.5 mg Oral BID AC   escitalopram  5 mg Oral QHS   folic acid  1 mg Oral Daily   guaiFENesin  1,200 mg Oral BID   insulin aspart  0-9 Units Subcutaneous Q4H   ipratropium-albuterol  3 mL Nebulization BID   levothyroxine  125 mcg Oral Q0600   loratadine  10 mg Oral Daily   pantoprazole  40 mg Oral BID   sodium phosphate  1 enema Rectal UD   Continuous Infusions:   LOS: 8 days    Time spent: 35 minutes  Leeroy Bock, MD [facility]   If 7PM-7AM, please contact night-coverage [email or web address]  03/27/2023, 7:51 AM

## 2023-03-27 NOTE — Progress Notes (Signed)
Mobility Specialist - Progress Note   03/27/23 1200  Oxygen Therapy  SpO2 (!) 87 %  O2 Device Nasal Cannula  O2 Flow Rate (L/min) 2 L/min  Patient Activity (if Appropriate) Ambulating  Mobility  Activity Ambulated with assistance in hallway  Level of Assistance Standby assist, set-up cues, supervision of patient - no hands on  Assistive Device Front wheel walker  Distance Ambulated (ft) 120 ft  Activity Response Tolerated well  Mobility Referral Yes  $Mobility charge 1 Mobility  Mobility Specialist Start Time (ACUTE ONLY) 1146  Mobility Specialist Stop Time (ACUTE ONLY) 1159  Mobility Specialist Time Calculation (min) (ACUTE ONLY) 13 min   Pt received in recliner and agreeable to mobility. After ambulating ~63ft, pt desat to 87%. Encouraged pursed lip breaths allowing O2 to come back up to 92%. No complaints during session. Pt to recliner after session with all needs met.    Pre-mobility: 92% SpO2 (2L [place]) During mobility: 88%-92% SpO2 (2L [place]) Post-mobility: 91% SPO2 (2L [place])  Chief Technology Officer

## 2023-03-27 NOTE — Plan of Care (Signed)

## 2023-03-27 NOTE — Progress Notes (Signed)
She had her Port-A-Cath placed yesterday.  As always, IR does a great job.  This morning, the Port-A-Cath does not pull back blood.  I had to believe that the Port-A-Cath is somewhat positional.  Hopefully, this will take care of itself.    She had another bout of nausea this morning.  I am not sure as to why she keeps having this nausea.  It is only at nighttime.  She has had when she had a bowel movement, she had a little bit of blood on the toilet paper.  She had little bit of mucus.  She has a pan sensitive E. coli.  Her breathing seems to be doing okay.  She is getting the nebulizers.  Nutrition is going to be the real issue.  It is hard to say and when she is really eating.  Her prealbumin of only 10 is certainly troublesome for me.  There are no labs back yet today.  She is still off the Eliquis.  Her cardiac monitor shows normal sinus rhythm.   Her vital signs are temperature of 98.3.  Pulse 80.  Blood pressure 155/65.  Her head neck exam shows no scleral icterus.  There is no adenopathy in the neck.  Lungs are clear bilaterally.  She has good air movement bilaterally.  Cardiac exam regular rate and rhythm.  Abdomen is soft.  Her bowel sounds are still decreased.  She has no fluid wave.  There is no guarding or rebound tenderness.  Extremity shows no clubbing, cyanosis or edema.  Neurological exam is nonfocal.   Ms. Lanza had the GI bleeding.  Again has not clear as to where this was coming from.  I know she has a rectal tumor.  I suppose that she could have had bleeding from the tumor itself.  I am still worried about this nausea.  Again, it might be from the medications.  I think it be well worthwhile getting her on Cipro for the E. coli.  I may try her on some Marinol to see if this may help with some of the nausea and maybe help with her appetite.  It is still unclear as to whether or not she is able to go home yet.  I know that she is getting wonderful care from all the  staff appear on 6E.  I do appreciate their compassion.    [name], MD  Romans 10:13

## 2023-03-28 ENCOUNTER — Encounter: Payer: Self-pay | Admitting: Hematology & Oncology

## 2023-03-28 DIAGNOSIS — K922 Gastrointestinal hemorrhage, unspecified: Secondary | ICD-10-CM | POA: Diagnosis not present

## 2023-03-28 LAB — COMPREHENSIVE METABOLIC PANEL
ALT: 53 U/L — ABNORMAL HIGH (ref 0–44)
AST: 28 U/L (ref 15–41)
Albumin: 2.6 g/dL — ABNORMAL LOW (ref 3.5–5.0)
Alkaline Phosphatase: 57 U/L (ref 38–126)
Anion gap: 7 (ref 5–15)
BUN: 13 mg/dL (ref 8–23)
CO2: 29 mmol/L (ref 22–32)
Calcium: 8.2 mg/dL — ABNORMAL LOW (ref 8.9–10.3)
Chloride: 98 mmol/L (ref 98–111)
Creatinine, Ser: 0.61 mg/dL (ref 0.44–1.00)
GFR, Estimated: >60 mL/min (ref >60)
Glucose, Bld: 107 mg/dL — ABNORMAL HIGH (ref 70–99)
Potassium: 3.2 mmol/L — ABNORMAL LOW (ref 3.5–5.1)
Sodium: 134 mmol/L — ABNORMAL LOW (ref 135–145)
Total Bilirubin: 1 mg/dL (ref 0.3–1.2)
Total Protein: 5.3 g/dL — ABNORMAL LOW (ref 6.5–8.1)

## 2023-03-28 LAB — GLUCOSE, CAPILLARY
Glucose-Capillary: 102 mg/dL — ABNORMAL HIGH (ref 70–99)
Glucose-Capillary: 105 mg/dL — ABNORMAL HIGH (ref 70–99)
Glucose-Capillary: 134 mg/dL — ABNORMAL HIGH (ref 70–99)
Glucose-Capillary: 151 mg/dL — ABNORMAL HIGH (ref 70–99)
Glucose-Capillary: 167 mg/dL — ABNORMAL HIGH (ref 70–99)
Glucose-Capillary: 171 mg/dL — ABNORMAL HIGH (ref 70–99)

## 2023-03-28 LAB — CBC WITH DIFFERENTIAL/PLATELET
Abs Immature Granulocytes: 0.01 10*3/uL (ref 0.00–0.07)
Basophils Absolute: 0 10*3/uL (ref 0.0–0.1)
Basophils Relative: 1 %
Eosinophils Absolute: 0.1 10*3/uL (ref 0.0–0.5)
Eosinophils Relative: 2 %
HCT: 32.2 % — ABNORMAL LOW (ref 36.0–46.0)
Hemoglobin: 10.2 g/dL — ABNORMAL LOW (ref 12.0–15.0)
Immature Granulocytes: 0 %
Lymphocytes Relative: 14 %
Lymphs Abs: 0.7 10*3/uL (ref 0.7–4.0)
MCH: 31.2 pg (ref 26.0–34.0)
MCHC: 31.7 g/dL (ref 30.0–36.0)
MCV: 98.5 fL (ref 80.0–100.0)
Monocytes Absolute: 0.7 10*3/uL (ref 0.1–1.0)
Monocytes Relative: 13 %
Neutro Abs: 3.8 10*3/uL (ref 1.7–7.7)
Neutrophils Relative %: 70 %
Platelets: 232 10*3/uL (ref 150–400)
RBC: 3.27 MIL/uL — ABNORMAL LOW (ref 3.87–5.11)
RDW: 20.8 % — ABNORMAL HIGH (ref 11.5–15.5)
WBC: 5.4 10*3/uL (ref 4.0–10.5)
nRBC: 0 % (ref 0.0–0.2)

## 2023-03-28 MED ORDER — POTASSIUM CHLORIDE CRYS ER 20 MEQ PO TBCR
40.0000 meq | EXTENDED_RELEASE_TABLET | ORAL | Status: AC
  Administered 2023-03-28 (×2): 40 meq via ORAL
  Filled 2023-03-28 (×2): qty 2

## 2023-03-28 MED ORDER — SODIUM CHLORIDE 0.9% FLUSH
10.0000 mL | Freq: Two times a day (BID) | INTRAVENOUS | Status: DC
  Administered 2023-03-28 – 2023-03-30 (×5): 10 mL

## 2023-03-28 MED ORDER — LACTULOSE 10 GM/15ML PO SOLN
20.0000 g | Freq: Once | ORAL | Status: AC
  Administered 2023-03-28: 20 g via ORAL
  Filled 2023-03-28: qty 30

## 2023-03-28 MED ORDER — POLYETHYLENE GLYCOL 3350 17 G PO PACK
17.0000 g | PACK | Freq: Two times a day (BID) | ORAL | Status: DC
  Administered 2023-03-28 – 2023-03-30 (×5): 17 g via ORAL
  Filled 2023-03-28 (×5): qty 1

## 2023-03-28 MED ORDER — SODIUM CHLORIDE 0.9% FLUSH: 10.0000 mL | INTRAVENOUS | Status: DC | PRN

## 2023-03-28 MED ORDER — CYANOCOBALAMIN 1000 MCG/ML IJ SOLN
1000.0000 ug | Freq: Every day | INTRAMUSCULAR | Status: DC
  Administered 2023-03-28 – 2023-03-30 (×3): 1000 ug via INTRAMUSCULAR
  Filled 2023-03-28 (×4): qty 1

## 2023-03-28 NOTE — Progress Notes (Signed)
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   They presented with black stool, rectal bleeding since the day prior to admission.  She reported lightheadedness.  She was evaluated at her oncologist office and she had very small external hemorrhoid, occult blood was positive. Hb 8.7   Since admission, she received 2 units pRBCs and now was stable hgb. GI was consulted and Underwent Endoscopy 7/10: Small hiatal hernia, Gastritis. Biopsied. A few benign gastric polyps. 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 Treated with argon plasma coagulation (APC). -CTA; negative for active bleeding.  -Underwent sigmoidoscopy 7/11: Showed distal rectal mass the size appears to be smaller than the recent sigmoidoscopy/2024.  However very friable mass and would  bleed easily. Her home eliquis was held. IR consulted and placed a port-a-cath 7/15 for chemotherapy which is tender today and unable to draw labs but per nursing report, was able to administer medications without difficulty.  Assessment & Plan:   Principal Problem:   Upper GI bleed Active Problems:   Symptomatic anemia   Hyperlipidemia   Hypothyroidism   Diabetes mellitus without complication (HCC)   HTN (hypertension)   Rectal cancer (HCC)   Paroxysmal atrial fibrillation (HCC)   Wheezing   GI bleed   Anticoagulated by anticoagulation treatment   Multifocal pneumonia   Acute cystitis without hematuria  Acute blood loss symptomatic anemia secondary to GI Bleed In setting of rectal cancer, mass and eliquis.  -She has received total 2 units PRBC this hospitalization.  -Underwent Endoscopy  7/10: Small hiatal hernia.  Gastritis. Biopsied. A few benign gastric polyps. 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 Treated with argon plasma coagulation (APC). -CTA; negative for active bleeding.  -Underwent sigmoidoscopy 7/11: Showed distal rectal mass the size appears to be smaller than the recent sigmoidoscopy/2024.  However very friable mass and would  bleed easily.  GI commended continuing to hold off to Eliquis if okay with cardiology and oncology.  Oncology has been okay with that.  Hemoglobin has remained stable for last few days.  Patient underwent Port-A-Cath placement by IR.   Acute hypoxic respiratory failure secondary to multilobar pneumonia- multifactorial. No baseline oxygen requirement. On 2L today but saturating 98% so perhaps she does not require oxygen.  Will try to wean off. Also new nodule in right middle lobe favored to be inflammatory but warrants repeat imaging in 12 months, per radiology guidelines.  - Continue with ceftriaxone x10 day course and Azithro x5 days - consider discontinuing amiodarone for contribution to lung dysfunction.    UTI; UA with positive nitrates.  Urine culture grew E. coli.  Patient on Rocephin.     Hyperlipidemia: discontinued statin due to LDL very low 59.   Diabetes : Sugar controlled, continue SSI.    HTN: Hold blood pressure medication   Hypothyroidism: Continue with Synthroid   Rectal cancer:  Follow-up with Dr. Myna Hidalgo.   PAF: rate controlled. Continue to hold Eliquis in the setting of GI bleed, plan to hold indefinitely for now. Needs to follow up with Dr Melburn Popper out patient  Continue with amiodarone  DVT prophylaxis: SCDs Start: 03/20/23 0326   Code Status: Full Code  Family Communication:  None present at bedside.  Plan of care discussed with patient  in length and he/she verbalized understanding and agreed with it.  Status is: Inpatient Remains inpatient appropriate because: Oncology wants her to stay until the weekend.   Estimated  body mass index is 29.22 kg/m as calculated from the following:   Height as of this encounter: 5' (1.524 m).   Weight as of this encounter: 67.9 kg.    Nutritional Assessment: Body mass index is 29.22 kg/m.Marland Kitchen Seen by dietician.  I agree with the assessment and plan as outlined below: Nutrition Status:        . Skin Assessment: I have examined the patient's skin and I agree with the wound assessment as performed by the wound care RN as outlined below:    Consultants:  Oncology and GI  Procedures:  As above  Antimicrobials:  Anti-infectives (From admission, onward)    Start     Dose/Rate Route Frequency Ordered Stop   03/27/23 1600  azithromycin (ZITHROMAX) tablet 500 mg        500 mg Oral  Once 03/27/23 1411 03/27/23 1817   03/27/23 1600  cefTRIAXone (ROCEPHIN) 1 g in sodium chloride 0.9 % 100 mL IVPB        1 g 200 mL/hr over 30 Minutes Intravenous Every 24 hours 03/27/23 1411 04/01/23 1559   03/27/23 0815  ciprofloxacin (CIPRO) tablet 500 mg  Status:  Discontinued        500 mg Oral 2 times daily 03/27/23 0718 03/27/23 1411   03/23/23 1545  azithromycin (ZITHROMAX) tablet 500 mg  Status:  Discontinued        500 mg Oral Daily 03/23/23 1450 03/27/23 0716   03/23/23 1000  cefTRIAXone (ROCEPHIN) 2 g in sodium chloride 0.9 % 100 mL IVPB  Status:  Discontinued        2 g 200 mL/hr over 30 Minutes Intravenous Every 24 hours 03/23/23 0913 03/27/23 0716         Subjective: Patient seen and examined.  Only complaint she has is constipation, last bowel movement 3 days ago.  No other complaint.  Objective: Vitals:   03/27/23 2027 03/27/23 2042 03/28/23 0504 03/28/23 0829  BP: (!) 138/57  (!) 142/63   Pulse: 73  70   Resp: 18  16   Temp: 98.3 F (36.8 C)  97.9 F (36.6 C)   TempSrc: Oral  Oral   SpO2: 97% 97% 99% 98%  Weight:      Height:        Intake/Output Summary (Last 24 hours) at 03/28/2023 1331 Last data filed at 03/28/2023 0945 Gross per 24 hour  Intake  360 ml  Output --  Net 360 ml   Filed Weights   03/19/23 1655  Weight: 67.9 kg    Examination:  General exam: Appears calm and comfortable  Respiratory system: Clear to auscultation. Respiratory effort normal. Cardiovascular system: S1 & S2 heard, RRR. No JVD, murmurs, rubs, gallops or clicks. No pedal edema. Gastrointestinal system: Abdomen is nondistended, soft and nontender. No organomegaly or masses felt. Normal bowel sounds heard. Central nervous system: Alert and oriented. No focal neurological deficits. Extremities: Symmetric 5 x 5 power. Skin: No rashes, lesions or ulcers Psychiatry: Judgement and insight appear normal. Mood & affect appropriate.    Data Reviewed: I have personally reviewed following labs and imaging studies  CBC: Recent Labs  Lab 03/24/23 0559 03/25/23 0539 03/26/23 0533 03/27/23 0702 03/28/23 0500  WBC 7.0 7.3 5.6 5.8 5.4  NEUTROABS  --  5.4 3.7 4.0 3.8  HGB 8.2*  11.8* 11.5* 11.8* 10.2*  HCT 25.8* 35.6* 35.5* 36.9 32.2*  MCV 102.8* 94.2 96.7 98.4 98.5  PLT 232 224 247 249 232   Basic Metabolic Panel: Recent Labs  Lab 03/24/23 0559 03/25/23 0539 03/26/23 0533 03/27/23 0702 03/28/23 0500  NA 133* 137 133* 133* 134*  K 4.0 3.0* 3.5 3.6 3.2*  CL 98 94* 97* 97* 98  CO2 27 31 28 27 29   GLUCOSE 115* 107* 108* 96 107*  BUN 15 11 12 15 13   CREATININE 0.78 0.62 0.67 0.60 0.61  CALCIUM 8.1* 8.3* 8.3* 8.2* 8.2*   GFR: Estimated Creatinine Clearance: 39.4 mL/min (by C-G formula based on SCr of 0.61 mg/dL). Liver Function Tests: Recent Labs  Lab 03/25/23 0539 03/26/23 0533 03/27/23 0702 03/28/23 0500  AST 36 49* 37 28  ALT 80* 77* 66* 53*  ALKPHOS 72 66 70 57  BILITOT 1.7* 1.6* 1.0 1.0  PROT 6.2* 5.7* 5.8* 5.3*  ALBUMIN 3.1* 2.9* 2.9* 2.6*   No results for input(s): "LIPASE", "AMYLASE" in the last 168 hours. No results for input(s): "AMMONIA" in the last 168 hours. Coagulation Profile: No results for input(s): "INR", "PROTIME" in  the last 168 hours. Cardiac Enzymes: No results for input(s): "CKTOTAL", "CKMB", "CKMBINDEX", "TROPONINI" in the last 168 hours. BNP (last 3 results) No results for input(s): "PROBNP" in the last 8760 hours. HbA1C: No results for input(s): "HGBA1C" in the last 72 hours. CBG: Recent Labs  Lab 03/27/23 2026 03/28/23 0016 03/28/23 0502 03/28/23 0742 03/28/23 1125  GLUCAP 127* 171* 102* 105* 167*   Lipid Profile: No results for input(s): "CHOL", "HDL", "LDLCALC", "TRIG", "CHOLHDL", "LDLDIRECT" in the last 72 hours. Thyroid Function Tests: No results for input(s): "TSH", "T4TOTAL", "FREET4", "T3FREE", "THYROIDAB" in the last 72 hours. Anemia Panel: No results for input(s): "VITAMINB12", "FOLATE", "FERRITIN", "TIBC", "IRON", "RETICCTPCT" in the last 72 hours. Sepsis Labs: No results for input(s): "PROCALCITON", "LATICACIDVEN" in the last 168 hours.  Recent Results (from the past 240 hour(s))  Urine Culture     Status: Abnormal   Collection Time: 03/23/23  2:25 AM   Specimen: Urine, Random  Result Value Ref Range Status   Specimen Description   Final    URINE, RANDOM Performed at [facility], [address] [place], [place] [address]    Special Requests   Final    NONE Performed at [facility], [address] [place], [place] [address]    Culture >=100,000 COLONIES/mL ESCHERICHIA COLI (A)  Final   Report Status 03/25/2023 FINAL  Final   Organism ID, Bacteria ESCHERICHIA COLI (A)  Final      Susceptibility   Escherichia coli - MIC*    AMPICILLIN <=2 SENSITIVE Sensitive     CEFAZOLIN <=4 SENSITIVE Sensitive     CEFEPIME <=0.12 SENSITIVE Sensitive     CEFTRIAXONE <=0.25 SENSITIVE Sensitive     CIPROFLOXACIN <=0.25 SENSITIVE Sensitive     GENTAMICIN <=1 SENSITIVE Sensitive     IMIPENEM <=0.25 SENSITIVE Sensitive     NITROFURANTOIN <=16 SENSITIVE Sensitive     TRIMETH/SULFA <=20 SENSITIVE Sensitive     AMPICILLIN/SULBACTAM <=2  SENSITIVE Sensitive     PIP/TAZO <=4 SENSITIVE Sensitive     * >=100,000 COLONIES/mL ESCHERICHIA COLI     Radiology Studies: IR IMAGING GUIDED PORT INSERTION  Result Date: 03/26/2023 INDICATION: 89 year old female with history of rectal mass and poor venous access. EXAM: IMPLANTED PORT A CATH PLACEMENT WITH ULTRASOUND AND FLUOROSCOPIC GUIDANCE COMPARISON:  None Available. MEDICATIONS:  None. ANESTHESIA/SEDATION: Moderate (conscious) sedation was employed during this procedure. A total of Versed 1 mg and Fentanyl 50 mcg was administered intravenously. Moderate Sedation Time: 14 minutes. The patient's level of consciousness and vital signs were monitored continuously by radiology nursing throughout the procedure under my direct supervision. CONTRAST:  None FLUOROSCOPY TIME:  0.1 mGy COMPLICATIONS: None immediate. PROCEDURE: The procedure, risks, benefits, and alternatives were explained to the patient. Questions regarding the procedure were encouraged and answered. The patient understands and consents to the procedure. The right neck and chest were prepped with chlorhexidine in a sterile fashion, and a sterile drape was applied covering the operative field. Maximum barrier sterile technique with sterile gowns and gloves were used for the procedure. A timeout was performed prior to the initiation of the procedure. Ultrasound was used to examine the jugular vein which was compressible and free of internal echoes. A skin marker was used to demarcate the planned venotomy and port pocket incision sites. Local anesthesia was provided to these sites and the subcutaneous tunnel track with 1% lidocaine with 1:100,000 epinephrine. A small incision was created at the jugular access site and blunt dissection was performed of the subcutaneous tissues. Under ultrasound guidance, the jugular vein was accessed with a 21 ga micropuncture needle and an 0.018" wire was inserted to the superior vena cava. Real-time ultrasound  guidance was utilized for vascular access including the acquisition of a permanent ultrasound image documenting patency of the accessed vessel. A 5 Fr micopuncture set was then used, through which a 0.035" Rosen wire was passed under fluoroscopic guidance into the inferior vena cava. An 8 Fr dilator was then placed over the wire. A subcutaneous port pocket was then created along the upper chest wall utilizing a combination of sharp and blunt dissection. The pocket was irrigated with sterile saline, packed with gauze, and observed for hemorrhage. A single lumen clear view power injectable port was chosen for placement. The 8 Fr catheter was tunneled from the port pocket site to the venotomy incision. The port was placed in the pocket. The external catheter was trimmed to appropriate length. The dilator was exchanged for an 8 Fr peel-away sheath under fluoroscopic guidance. The catheter was then placed through the sheath and the sheath was removed. Final catheter positioning was confirmed and documented with a fluoroscopic spot radiograph. The port was accessed with a Huber needle, aspirated, and flushed with heparinized saline. The deep dermal layer of the port pocket incision was closed with interrupted 3-0 Vicryl suture. Dermabond was then placed over the port pocket and neck incisions. The patient tolerated the procedure well without immediate post procedural complication. FINDINGS: After catheter placement, the tip lies within the superior cavoatrial junction. The catheter aspirates and flushes normally and is ready for immediate use. IMPRESSION: Successful placement of a power injectable Port-A-Cath via the right internal jugular vein. The catheter is ready for immediate use. Marliss Coots, MD [facility] Radiology Electronically Signed   By: Marliss Coots M.D.   On: 03/26/2023 15:20    Scheduled Meds:  sodium chloride   Intravenous Once   amiodarone  200 mg  Oral Daily   atorvastatin  20 mg Oral Daily   budesonide (PULMICORT) nebulizer solution  0.25 mg Nebulization BID   Chlorhexidine Gluconate Cloth  6 each Topical Daily   cyanocobalamin  1,000 mcg Intramuscular Daily   dronabinol  2.5 mg Oral BID AC   escitalopram  5 mg Oral QHS   folic acid  1 mg Oral Daily   guaiFENesin  1,200 mg Oral BID   insulin aspart  0-9 Units Subcutaneous Q4H   ipratropium-albuterol  3 mL Nebulization BID   levothyroxine  125 mcg Oral Q0600   loratadine  10 mg Oral Daily   pantoprazole  40 mg Oral BID   polyethylene glycol  17 g Oral BID   potassium chloride  40 mEq Oral Q4H   sodium chloride flush  10-40 mL Intracatheter Q12H   sodium phosphate  1 enema Rectal UD   Continuous Infusions:  cefTRIAXone (ROCEPHIN)  IV 1 g (03/27/23 1828)     LOS: 9 days   Hughie Closs, MD [facility]  03/28/2023, 1:31 PM   *Please note that this is a verbal dictation therefore any spelling or grammatical errors are due to the "Dragon [employer] One" system interpretation.  Please page via Amion and do not message via secure chat for urgent patient care matters. Secure chat can be used for non urgent patient care matters.  How to contact the [facility] Attending or Consulting provider 7A - 7P or covering provider during after hours 7P -7A, for this patient?  Check the care team in [facility] and look for a) attending/consulting [facility] provider listed and b) the [facility] team listed. Page or secure chat 7A-7P. Log into [email or web address] and use [facility] universal password to access. If you do not have the password, please contact the hospital operator. Locate the [facility] provider you are looking for under [facility] and page to a number that you can be directly reached. If you still have difficulty reaching the provider, please page the [facility] (Director on Call) for the Hospitalists listed on amion for assistance.

## 2023-03-28 NOTE — Progress Notes (Signed)
[name] is complaining of some constipation.  She feels that she needs to have a bowel movement.  We will go ahead and put her on some MiraLAX.  I will also give her 1 dose of lactulose.  She does have low vitamin B12.  While she is in the hospital, we will give her B12 injection daily.  Her appetite is doing okay.  I started her on some Marinol yesterday.  She is still on some IV antibiotics.  Pharmacy and the hospitalist were managing this.  She did walk yesterday little bit.  She would like to walk a little bit more today.  Her Port-A-Cath seems to be working better now.  Her labs show sodium 134.  Potassium 3.2.  BUN is 13 creatinine 0.61.  Calcium 8.2 with an albumin of 2.6.  Her white cell count is 5.4.  Hemoglobin 10.2.  Platelet count 232,000.  She does have the E. coli urinary tract infection.  There also is a little bit of pneumonia.  She has been afebrile.  If there is no problems with recurrent atrial fibrillation.  Her heart seems to be in sinus rhythm.  At some point, we really have to try to get her home.  This will certainly be advantageous for her.  She does not complain of any pain.  She has had no cough or shortness of breath.  There may be a little bit of wheezing at nighttime.  She is on nebulizers.  Her vital signs show temperature of 97.9.  Pulse 70.  Blood pressure 142/63.  Her head and exam shows no scleral icterus.  There is no oral lesions.  She has no adenopathy in the neck.  Lungs are clear bilaterally.  Cardiac exam regular rate and rhythm.  There are no murmurs.  Abdomen is soft.  Bowel sounds might be a little bit decreased.  There is no guarding or rebound tenderness.  There is no hepatosplenomegaly.  Extremities shows no clubbing, cyanosis or edema.  Neurological exam is nonfocal.  A lot has gone on with [name].  Everything seems to be resolving slowly.  She most came in for that GI bleeding.  I think this is been holding steady.  Her hemoglobin is  down a little bit.  She does have the B12 deficiency.  I would go ahead and have her on injectable vitamin B12 while she is in the hospital.  She is still on IV antibiotics.  Hopefully she will have a bowel movement today.  Hopefully there will not be any blood in there.  I would still like to believe that she will be able to go home on the weekend.  I know she has had fantastic care from everybody upon 6 E.   [name], MD  Philemon 1:4

## 2023-03-28 NOTE — Progress Notes (Signed)
Mobility Specialist - Progress Note   03/28/23 1154  Oxygen Therapy  O2 Device Nasal Cannula  O2 Flow Rate (L/min) 2 L/min  Mobility  Activity Ambulated with assistance in hallway  Level of Assistance Standby assist, set-up cues, supervision of patient - no hands on  Assistive Device Front wheel walker  Distance Ambulated (ft) 145 ft  Activity Response Tolerated well  Mobility Referral Yes  $Mobility charge 1 Mobility  Mobility Specialist Start Time (ACUTE ONLY) 1154  Mobility Specialist Stop Time (ACUTE ONLY) 1145  Mobility Specialist Time Calculation (min) (ACUTE ONLY) 1431 min   Pt received in bathroom and agreeable to mobility. No complaints during session. Pt to recliner after session with all needs met.    Pre-mobility: 92% SpO2 During mobility: 92% SpO2 Post-mobility: 91% SPO2  Chief Technology Officer

## 2023-03-28 NOTE — Plan of Care (Signed)

## 2023-03-29 ENCOUNTER — Inpatient Hospital Stay (HOSPITAL_COMMUNITY): Payer: Medicare PPO

## 2023-03-29 DIAGNOSIS — K922 Gastrointestinal hemorrhage, unspecified: Secondary | ICD-10-CM | POA: Diagnosis not present

## 2023-03-29 LAB — COMPREHENSIVE METABOLIC PANEL
ALT: 60 U/L — ABNORMAL HIGH (ref 0–44)
AST: 43 U/L — ABNORMAL HIGH (ref 15–41)
Albumin: 2.7 g/dL — ABNORMAL LOW (ref 3.5–5.0)
Alkaline Phosphatase: 61 U/L (ref 38–126)
Anion gap: 6 (ref 5–15)
BUN: 12 mg/dL (ref 8–23)
CO2: 29 mmol/L (ref 22–32)
Calcium: 8.4 mg/dL — ABNORMAL LOW (ref 8.9–10.3)
Chloride: 98 mmol/L (ref 98–111)
Creatinine, Ser: 0.54 mg/dL (ref 0.44–1.00)
GFR, Estimated: >60 mL/min (ref >60)
Glucose, Bld: 99 mg/dL (ref 70–99)
Potassium: 4.1 mmol/L (ref 3.5–5.1)
Sodium: 133 mmol/L — ABNORMAL LOW (ref 135–145)
Total Bilirubin: 1.3 mg/dL — ABNORMAL HIGH (ref 0.3–1.2)
Total Protein: 5.5 g/dL — ABNORMAL LOW (ref 6.5–8.1)

## 2023-03-29 LAB — URINALYSIS, ROUTINE W REFLEX MICROSCOPIC
Bilirubin Urine: NEGATIVE
Glucose, UA: NEGATIVE mg/dL
Hgb urine dipstick: NEGATIVE
Ketones, ur: NEGATIVE mg/dL
Leukocytes,Ua: NEGATIVE
Nitrite: NEGATIVE
Protein, ur: NEGATIVE mg/dL
Specific Gravity, Urine: 1.014 (ref 1.005–1.030)
pH: 6 (ref 5.0–8.0)

## 2023-03-29 LAB — CBC WITH DIFFERENTIAL/PLATELET
Abs Immature Granulocytes: 0.01 10*3/uL (ref 0.00–0.07)
Basophils Absolute: 0 10*3/uL (ref 0.0–0.1)
Basophils Relative: 1 %
Eosinophils Absolute: 0.2 10*3/uL (ref 0.0–0.5)
Eosinophils Relative: 3 %
HCT: 32.6 % — ABNORMAL LOW (ref 36.0–46.0)
Hemoglobin: 10.5 g/dL — ABNORMAL LOW (ref 12.0–15.0)
Immature Granulocytes: 0 %
Lymphocytes Relative: 12 %
Lymphs Abs: 0.7 10*3/uL (ref 0.7–4.0)
MCH: 31.9 pg (ref 26.0–34.0)
MCHC: 32.2 g/dL (ref 30.0–36.0)
MCV: 99.1 fL (ref 80.0–100.0)
Monocytes Absolute: 0.7 10*3/uL (ref 0.1–1.0)
Monocytes Relative: 12 %
Neutro Abs: 4.2 10*3/uL (ref 1.7–7.7)
Neutrophils Relative %: 72 %
Platelets: 238 10*3/uL (ref 150–400)
RBC: 3.29 MIL/uL — ABNORMAL LOW (ref 3.87–5.11)
RDW: 20.4 % — ABNORMAL HIGH (ref 11.5–15.5)
WBC: 5.8 10*3/uL (ref 4.0–10.5)
nRBC: 0 % (ref 0.0–0.2)

## 2023-03-29 LAB — GLUCOSE, CAPILLARY
Glucose-Capillary: 116 mg/dL — ABNORMAL HIGH (ref 70–99)
Glucose-Capillary: 153 mg/dL — ABNORMAL HIGH (ref 70–99)
Glucose-Capillary: 158 mg/dL — ABNORMAL HIGH (ref 70–99)
Glucose-Capillary: 168 mg/dL — ABNORMAL HIGH (ref 70–99)
Glucose-Capillary: 84 mg/dL (ref 70–99)
Glucose-Capillary: 97 mg/dL (ref 70–99)

## 2023-03-29 MED ORDER — ONDANSETRON HCL 4 MG/2ML IJ SOLN
4.0000 mg | Freq: Once | INTRAMUSCULAR | Status: AC
  Administered 2023-03-29: 4 mg via INTRAVENOUS
  Filled 2023-03-29: qty 2

## 2023-03-29 MED ORDER — PROCHLORPERAZINE MALEATE 10 MG PO TABS
5.0000 mg | ORAL_TABLET | Freq: Every day | ORAL | Status: DC
  Administered 2023-03-29: 5 mg via ORAL
  Filled 2023-03-29: qty 1

## 2023-03-29 NOTE — Progress Notes (Signed)
Mobility Specialist - Progress Note  Pre-mobility: 70 bpm HR, 98% SpO2 During mobility: 83 bpm HR, 94% SpO2 Post-mobility: 80 bpm HR, 96% SPO2   03/29/23 1425  Mobility  Activity Ambulated with assistance in hallway  Level of Assistance Standby assist, set-up cues, supervision of patient - no hands on  Assistive Device Front wheel walker  Distance Ambulated (ft) 100 ft  Range of Motion/Exercises Active  Activity Response Tolerated well  Mobility Referral Yes  $Mobility charge 1 Mobility  Mobility Specialist Start Time (ACUTE ONLY) 1410  Mobility Specialist Stop Time (ACUTE ONLY) 1425  Mobility Specialist Time Calculation (min) (ACUTE ONLY) 15 min   Pt was found on recliner chair and agreeable to ambulate. Felt SOB with session and became fatigued. At EOS returned to recliner chair with all needs met. Call bell in reach.  Billey Chang Mobility Specialist

## 2023-03-29 NOTE — Progress Notes (Signed)
[name] is feeling okay this morning.  She had another bout of nausea this morning.  There is no vomiting.  Is hard to say why she is having this.  I think that it may not be a bad idea to try her on a dose of Compazine at nighttime.  Will see if this may be able to help.  She is having some bowel movements.  Her stool is dark brown.  Her labs are stable.  Her white cell count is 5.8.  Hemoglobin 10.5.  Platelet count 238,000.  Her sodium is 133.  Potassium 4.1.  BUN 12 creatinine 0.54.  Calcium 8.4 with albumin of 2.7.  Her LFTs are slightly elevated.  Her appetite is good.  However on little Marinol.  She is still off anticoagulation.  I probably would keep her off anticoagulation.  I do not see any evidence of atrial fibrillation.  She has had no fever.  She is off antibiotics now for the E. coli.  There is no cough or shortness of breath.  She continues on her nebulizers.  Her vital signs are temperature of 98.2.  Pulse 71.  Blood pressure 148/60.  Head and neck exam shows no ocular or oral lesions.  She has no thrush.  Lungs are clear bilaterally.  Cardiac exam regular rate and rhythm.  She has no murmurs.  Abdomen is soft.  She may be little bit distended.  Bowel sounds are present.  They are little bit better than yesterday.  There is no guarding or rebound tenderness.  There is no palpable liver or spleen tip.  Extremities shows some slight edema in the legs.  She does have some ecchymoses in upper lower extremities.  Neurological exam is nonfocal.   [name] is improving.  Everything is holding steady.  It would be nice to try to get her home on the weekend.  I am glad that her hemoglobin is holding steady.  She is getting vitamin B12.  She will need vitamin B12 as an outpatient.  She is on also folic acid.  We will try her on Compazine at bedtime to see if this may help with the nausea.  She does have a little bit of distention so that she may have some gas.  Hopefully she will be  able to eliminate this gas.  I will speak to her son today.   [name], MD  2 Chronicles 7:15

## 2023-03-29 NOTE — Plan of Care (Signed)

## 2023-03-29 NOTE — Progress Notes (Signed)
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   They presented with black stool, rectal bleeding since the day prior to admission.  She reported lightheadedness.  She was evaluated at her oncologist office and she had very small external hemorrhoid, occult blood was positive. Hb 8.7   Since admission, she received 2 units pRBCs and now was stable hgb. GI was consulted and Underwent Endoscopy 7/10: Small hiatal hernia, Gastritis. Biopsied. A few benign gastric polyps. 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 Treated with argon plasma coagulation (APC). -CTA; negative for active bleeding.  -Underwent sigmoidoscopy 7/11: Showed distal rectal mass the size appears to be smaller than the recent sigmoidoscopy/2024.  However very friable mass and would  bleed easily. Her home eliquis was held. IR consulted and placed a port-a-cath 7/15 for chemotherapy which is tender today and unable to draw labs but per nursing report, was able to administer medications without difficulty.  Assessment & Plan:   Principal Problem:   Upper GI bleed Active Problems:   Symptomatic anemia   Hyperlipidemia   Hypothyroidism   Diabetes mellitus without complication (HCC)   HTN (hypertension)   Rectal cancer (HCC)   Paroxysmal atrial fibrillation (HCC)   Wheezing   GI bleed   Anticoagulated by anticoagulation treatment   Multifocal pneumonia   Acute cystitis without hematuria  Acute blood loss symptomatic anemia secondary to GI Bleed In setting of rectal cancer, mass and eliquis.  -She has received total 2 units PRBC this hospitalization.  -Underwent Endoscopy  7/10: Small hiatal hernia.  Gastritis. Biopsied. A few benign gastric polyps. 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 Treated with argon plasma coagulation (APC). -CTA; negative for active bleeding.  -Underwent sigmoidoscopy 7/11: Showed distal rectal mass the size appears to be smaller than the recent sigmoidoscopy/2024.  However very friable mass and would  bleed easily.  GI commended continuing to hold off to Eliquis if okay with cardiology and oncology.  Oncology/Dr. Myna Hidalgo has been holding her Eliquis.  Patient underwent Port-A-Cath placement by IR.   Acute hypoxic respiratory failure secondary to multilobar pneumonia- multifactorial. No baseline oxygen requirement. On 2L today but saturating 98% so perhaps she does not require oxygen.  Try to wean off. Also new nodule in right middle lobe favored to be inflammatory but warrants repeat imaging in 12 months, per radiology guidelines.  - Continue with ceftriaxone x10 day course and Azithro x5 days - consider discontinuing amiodarone for contribution to lung dysfunction.    UTI; UA with positive nitrates.  Urine culture grew E. coli.  Patient on Rocephin.     Hyperlipidemia: discontinued statin due to LDL very low 59.   Diabetes : Sugar controlled, continue SSI.    HTN: Hold blood pressure medication   Hypothyroidism: Continue with Synthroid   Rectal cancer:  Follow-up with Dr. Myna Hidalgo.   PAF: rate controlled. Continue to hold Eliquis in the setting of GI bleed, plan to hold indefinitely for now. Needs to follow up with Dr Melburn Popper out patient  Continue with amiodarone  DVT prophylaxis: SCDs Start: 03/20/23 0326   Code Status: Full Code  Family Communication:  None present at bedside.  Plan of care discussed with patient in length and he/she verbalized understanding and agreed with  it.  Status is: Inpatient Remains inpatient appropriate because: Oncology wants her to stay until the weekend since patient had nausea this morning.  He will also  discussed with patient's son.   Estimated body mass index is 29.22 kg/m as calculated from the following:   Height as of this encounter: 5' (1.524 m).   Weight as of this encounter: 67.9 kg.    Nutritional Assessment: Body mass index is 29.22 kg/m.Marland Kitchen Seen by dietician.  I agree with the assessment and plan as outlined below: Nutrition Status:        . Skin Assessment: I have examined the patient's skin and I agree with the wound assessment as performed by the wound care RN as outlined below:    Consultants:  Oncology and GI  Procedures:  As above  Antimicrobials:  Anti-infectives (From admission, onward)    Start     Dose/Rate Route Frequency Ordered Stop   03/27/23 1600  azithromycin (ZITHROMAX) tablet 500 mg        500 mg Oral  Once 03/27/23 1411 03/27/23 1817   03/27/23 1600  cefTRIAXone (ROCEPHIN) 1 g in sodium chloride 0.9 % 100 mL IVPB  Status:  Discontinued        1 g 200 mL/hr over 30 Minutes Intravenous Every 24 hours 03/27/23 1411 03/28/23 1352   03/27/23 0815  ciprofloxacin (CIPRO) tablet 500 mg  Status:  Discontinued        500 mg Oral 2 times daily 03/27/23 0718 03/27/23 1411   03/23/23 1545  azithromycin (ZITHROMAX) tablet 500 mg  Status:  Discontinued        500 mg Oral Daily 03/23/23 1450 03/27/23 0716   03/23/23 1000  cefTRIAXone (ROCEPHIN) 2 g in sodium chloride 0.9 % 100 mL IVPB  Status:  Discontinued        2 g 200 mL/hr over 30 Minutes Intravenous Every 24 hours 03/23/23 0913 03/27/23 0716         Subjective: Seen and examined.  Per oncology's note, she had some nausea this morning but during my evaluation, she denies any nausea.  No other complaint.  Objective: Vitals:   03/28/23 2047 03/28/23 2052 03/29/23 0408 03/29/23 0939  BP:  (!) 170/83 (!) 148/60   Pulse:  78 71   Resp:  14 14   Temp:  98.2 F (36.8 C) 98.2 F (36.8 C)   TempSrc:  Oral Oral   SpO2: (!) 85% 100% 95% 95%  Weight:      Height:        Intake/Output Summary  (Last 24 hours) at 03/29/2023 0948 Last data filed at 03/28/2023 1500 Gross per 24 hour  Intake 240 ml  Output --  Net 240 ml   Filed Weights   03/19/23 1655  Weight: 67.9 kg    Examination:  General exam: Appears calm and comfortable  Respiratory system: Clear to auscultation. Respiratory effort normal. Cardiovascular system: S1 & S2 heard, RRR. No JVD, murmurs, rubs, gallops or clicks. No pedal edema. Gastrointestinal system: Abdomen is nondistended, soft and nontender. No organomegaly or masses felt. Normal bowel sounds heard. Central nervous system: Alert and oriented. No focal neurological deficits. Extremities: Symmetric 5 x 5 power. Skin: No rashes, lesions or ulcers.   Data Reviewed: I have personally reviewed following labs and imaging studies  CBC: Recent Labs  Lab 03/25/23 0539 03/26/23 0533 03/27/23 0702 03/28/23 0500 03/29/23 0500  WBC 7.3 5.6 5.8 5.4 5.8  NEUTROABS 5.4 3.7 4.0 3.8 4.2  HGB  11.8* 11.5* 11.8* 10.2* 10.5*  HCT 35.6* 35.5* 36.9 32.2* 32.6*  MCV 94.2 96.7 98.4 98.5 99.1  PLT 224 247 249 232 238   Basic Metabolic Panel: Recent Labs  Lab 03/25/23 0539 03/26/23 0533 03/27/23 0702 03/28/23 0500 03/29/23 0500  NA 137 133* 133* 134* 133*  K 3.0* 3.5 3.6 3.2* 4.1  CL 94* 97* 97* 98 98  CO2 31 28 27 29 29   GLUCOSE 107* 108* 96 107* 99  BUN 11 12 15 13 12   CREATININE 0.62 0.67 0.60 0.61 0.54  CALCIUM 8.3* 8.3* 8.2* 8.2* 8.4*   GFR: Estimated Creatinine Clearance: 39.4 mL/min (by C-G formula based on SCr of 0.54 mg/dL). Liver Function Tests: Recent Labs  Lab 03/25/23 0539 03/26/23 0533 03/27/23 0702 03/28/23 0500 03/29/23 0500  AST 36 49* 37 28 43*  ALT 80* 77* 66* 53* 60*  ALKPHOS 72 66 70 57 61  BILITOT 1.7* 1.6* 1.0 1.0 1.3*  PROT 6.2* 5.7* 5.8* 5.3* 5.5*  ALBUMIN 3.1* 2.9* 2.9* 2.6* 2.7*   No results for input(s): "LIPASE", "AMYLASE" in the last 168 hours. No results for input(s): "AMMONIA" in the last 168 hours. Coagulation  Profile: No results for input(s): "INR", "PROTIME" in the last 168 hours. Cardiac Enzymes: No results for input(s): "CKTOTAL", "CKMB", "CKMBINDEX", "TROPONINI" in the last 168 hours. BNP (last 3 results) No results for input(s): "PROBNP" in the last 8760 hours. HbA1C: No results for input(s): "HGBA1C" in the last 72 hours. CBG: Recent Labs  Lab 03/28/23 1616 03/28/23 2055 03/29/23 0005 03/29/23 0412 03/29/23 0751  GLUCAP 134* 151* 153* 84 97   Lipid Profile: No results for input(s): "CHOL", "HDL", "LDLCALC", "TRIG", "CHOLHDL", "LDLDIRECT" in the last 72 hours. Thyroid Function Tests: No results for input(s): "TSH", "T4TOTAL", "FREET4", "T3FREE", "THYROIDAB" in the last 72 hours. Anemia Panel: No results for input(s): "VITAMINB12", "FOLATE", "FERRITIN", "TIBC", "IRON", "RETICCTPCT" in the last 72 hours. Sepsis Labs: No results for input(s): "PROCALCITON", "LATICACIDVEN" in the last 168 hours.  Recent Results (from the past 240 hour(s))  Urine Culture     Status: Abnormal   Collection Time: 03/23/23  2:25 AM   Specimen: Urine, Random  Result Value Ref Range Status   Specimen Description   Final    URINE, RANDOM Performed at [facility], [address] [place], [place] [address]    Special Requests   Final    NONE Performed at [facility], [address] [place], [place] [address]    Culture >=100,000 COLONIES/mL ESCHERICHIA COLI (A)  Final   Report Status 03/25/2023 FINAL  Final   Organism ID, Bacteria ESCHERICHIA COLI (A)  Final      Susceptibility   Escherichia coli - MIC*    AMPICILLIN <=2 SENSITIVE Sensitive     CEFAZOLIN <=4 SENSITIVE Sensitive     CEFEPIME <=0.12 SENSITIVE Sensitive     CEFTRIAXONE <=0.25 SENSITIVE Sensitive     CIPROFLOXACIN <=0.25 SENSITIVE Sensitive     GENTAMICIN <=1 SENSITIVE Sensitive     IMIPENEM <=0.25 SENSITIVE Sensitive     NITROFURANTOIN <=16 SENSITIVE Sensitive     TRIMETH/SULFA <=20  SENSITIVE Sensitive     AMPICILLIN/SULBACTAM <=2 SENSITIVE Sensitive     PIP/TAZO <=4 SENSITIVE Sensitive     * >=100,000 COLONIES/mL ESCHERICHIA COLI     Radiology Studies: DG Chest 2 View  Result Date: 03/29/2023 CLINICAL DATA:  Pneumonia EXAM: CHEST - 2 VIEW COMPARISON:  Chest radiograph dated 03/23/2023 FINDINGS: Lines/tubes: Right chest wall  port tip projects over the superior cavoatrial junction. Lungs: Increased multifocal patchy opacities, right-greater-than-left. Pleura: New blunting of the right costophrenic angle. No pneumothorax. Heart/mediastinum: The heart size and mediastinal contours are within normal limits. Bones: No acute osseous abnormality. IMPRESSION: 1. Increased multifocal patchy opacities, right-greater-than-left, in keeping with known multifocal pneumonia. 2. New small right pleural effusion. Electronically Signed   By: Agustin Cree M.D.   On: 03/29/2023 09:15    Scheduled Meds:  amiodarone  200 mg Oral Daily   atorvastatin  20 mg Oral Daily   budesonide (PULMICORT) nebulizer solution  0.25 mg Nebulization BID   Chlorhexidine Gluconate Cloth  6 each Topical Daily   cyanocobalamin  1,000 mcg Intramuscular Daily   dronabinol  2.5 mg Oral BID AC   escitalopram  5 mg Oral QHS   folic acid  1 mg Oral Daily   guaiFENesin  1,200 mg Oral BID   insulin aspart  0-9 Units Subcutaneous Q4H   ipratropium-albuterol  3 mL Nebulization BID   levothyroxine  125 mcg Oral Q0600   loratadine  10 mg Oral Daily   pantoprazole  40 mg Oral BID   polyethylene glycol  17 g Oral BID   prochlorperazine  5 mg Oral QHS   sodium chloride flush  10-40 mL Intracatheter Q12H   Continuous Infusions:     LOS: 10 days   Hughie Closs, MD [facility]  03/29/2023, 9:48 AM   *Please note that this is a verbal dictation therefore any spelling or grammatical errors are due to the "Dragon [employer] One" system interpretation.  Please page via Amion and do not message via secure chat for  urgent patient care matters. Secure chat can be used for non urgent patient care matters.  How to contact the [facility] Attending or Consulting provider 7A - 7P or covering provider during after hours 7P -7A, for this patient?  Check the care team in [facility] and look for a) attending/consulting TRH provider listed and b) the [facility] team listed. Page or secure chat 7A-7P. Log into [email or web address] and use [place]'s universal password to access. If you do not have the password, please contact the hospital operator. Locate the [facility] provider you are looking for under [facility] and page to a number that you can be directly reached. If you still have difficulty reaching the provider, please page the [facility] (Director on Call) for the Hospitalists listed on amion for assistance.

## 2023-03-29 NOTE — Care Management Important Message (Signed)
Important Message  Patient Details IM Letter given. Name: [name] MRN: [id number] Date of Birth: Apr 06, 1929   Medicare Important Message Given:  Yes     [name] 03/29/2023, 1:01 PM

## 2023-03-30 ENCOUNTER — Encounter: Payer: Self-pay | Admitting: Hematology & Oncology

## 2023-03-30 DIAGNOSIS — K922 Gastrointestinal hemorrhage, unspecified: Secondary | ICD-10-CM | POA: Diagnosis not present

## 2023-03-30 LAB — COMPREHENSIVE METABOLIC PANEL
ALT: 62 U/L — ABNORMAL HIGH (ref 0–44)
AST: 45 U/L — ABNORMAL HIGH (ref 15–41)
Albumin: 2.7 g/dL — ABNORMAL LOW (ref 3.5–5.0)
Alkaline Phosphatase: 66 U/L (ref 38–126)
Anion gap: 5 (ref 5–15)
BUN: 14 mg/dL (ref 8–23)
CO2: 30 mmol/L (ref 22–32)
Calcium: 8.2 mg/dL — ABNORMAL LOW (ref 8.9–10.3)
Chloride: 96 mmol/L — ABNORMAL LOW (ref 98–111)
Creatinine, Ser: 0.57 mg/dL (ref 0.44–1.00)
GFR, Estimated: >60 mL/min (ref >60)
Glucose, Bld: 90 mg/dL (ref 70–99)
Potassium: 4.1 mmol/L (ref 3.5–5.1)
Sodium: 131 mmol/L — ABNORMAL LOW (ref 135–145)
Total Bilirubin: 1 mg/dL (ref 0.3–1.2)
Total Protein: 5.7 g/dL — ABNORMAL LOW (ref 6.5–8.1)

## 2023-03-30 LAB — CBC WITH DIFFERENTIAL/PLATELET
Abs Immature Granulocytes: 0.02 10*3/uL (ref 0.00–0.07)
Basophils Absolute: 0 10*3/uL (ref 0.0–0.1)
Basophils Relative: 1 %
Eosinophils Absolute: 0.2 10*3/uL (ref 0.0–0.5)
Eosinophils Relative: 3 %
HCT: 33 % — ABNORMAL LOW (ref 36.0–46.0)
Hemoglobin: 10.4 g/dL — ABNORMAL LOW (ref 12.0–15.0)
Immature Granulocytes: 0 %
Lymphocytes Relative: 15 %
Lymphs Abs: 0.9 10*3/uL (ref 0.7–4.0)
MCH: 31.2 pg (ref 26.0–34.0)
MCHC: 31.5 g/dL (ref 30.0–36.0)
MCV: 99.1 fL (ref 80.0–100.0)
Monocytes Absolute: 0.8 10*3/uL (ref 0.1–1.0)
Monocytes Relative: 14 %
Neutro Abs: 3.9 10*3/uL (ref 1.7–7.7)
Neutrophils Relative %: 67 %
Platelets: 236 10*3/uL (ref 150–400)
RBC: 3.33 MIL/uL — ABNORMAL LOW (ref 3.87–5.11)
RDW: 20 % — ABNORMAL HIGH (ref 11.5–15.5)
WBC: 5.9 10*3/uL (ref 4.0–10.5)
nRBC: 0 % (ref 0.0–0.2)

## 2023-03-30 LAB — GLUCOSE, CAPILLARY
Glucose-Capillary: 163 mg/dL — ABNORMAL HIGH (ref 70–99)
Glucose-Capillary: 172 mg/dL — ABNORMAL HIGH (ref 70–99)
Glucose-Capillary: 82 mg/dL (ref 70–99)
Glucose-Capillary: 97 mg/dL (ref 70–99)

## 2023-03-30 MED ORDER — ONDANSETRON HCL 4 MG/2ML IJ SOLN
4.0000 mg | Freq: Four times a day (QID) | INTRAMUSCULAR | Status: DC | PRN
  Administered 2023-03-30: 4 mg via INTRAVENOUS

## 2023-03-30 MED ORDER — HEPARIN SOD (PORK) LOCK FLUSH 100 UNIT/ML IV SOLN
500.0000 [IU] | Freq: Once | INTRAVENOUS | Status: AC
  Administered 2023-03-30: 500 [IU] via INTRAVENOUS

## 2023-03-30 MED ORDER — ONDANSETRON HCL 4 MG/2ML IJ SOLN
INTRAMUSCULAR | Status: AC
  Filled 2023-03-30: qty 2

## 2023-03-30 MED ORDER — HEPARIN SOD (PORK) LOCK FLUSH 100 UNIT/ML IV SOLN
INTRAVENOUS | Status: AC
  Filled 2023-03-30: qty 5

## 2023-03-30 NOTE — Evaluation (Signed)
Physical Therapy Evaluation Patient Details Name: [name] MRN: [id number] DOB: 1929-09-02 Today's Date: 03/30/2023  History of Present Illness  89 yo female presents to therapy following hospital admission on 03/19/2023 due to melena, rectal pain and nausea. 7/10 pt underwent esophagogastroduenoscopy with propofol and 7/11 flexible sigmoidscopy. 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 01/19/2023 pt underwent flexible sigmoidoscopy.  Pt PMH includes but is not limited to:  AVM, GERD, DDD, DM II, lynch syndrome, CVA, HLD, asthma, anemia, intermittent GI bleed and rectal ca.  Clinical Impression    Pt admitted with above diagnosis.  Pt currently with functional limitations due to the deficits listed below (see PT Problem List). Pt seated in recliner when PT arrived. Pt reported no pain or SOB at rest and pt on 2 L/min and 97%. Pt reports no supplemental O2 PLOF. Pt reported she is ready to go home today. PT assessed pt response on RA t/o evaluation, please see below. Pt required S for transfer and gait tasks up to 85 feet with use of RW. Pt left seated in recliner, all needs in place and on 2 L/min and 94%. Pt will benefit from acute skilled PT to increase their independence and safety with mobility to allow discharge.   92% on RA at rest Desaturated with gait to 83% on RA. Once seated and cues for pursed lip breathing, no change in O2 saturation in 30s, 60s pt O2 88% on RA, 1:17 90% at 1:45 90% and PT donned supplemental O2 at 2 L/min and ale to recover to 92% with supplemental O2 in 2:20. No reports of SOB.       Assistance Recommended at Discharge Intermittent Supervision/Assistance  If plan is discharge home, recommend the following:  Can travel by private vehicle  A little help with walking and/or transfers;A little help with  bathing/dressing/bathroom;Assistance with cooking/housework;Assist for transportation;Help with stairs or ramp for entrance        Equipment Recommendations None recommended by PT (pt reports RW in home setting)  Recommendations for Other Services       Functional Status Assessment Patient has had a recent decline in their functional status and demonstrates the ability to make significant improvements in function in a reasonable and predictable amount of time.     Precautions / Restrictions Precautions Precautions: Fall Restrictions Weight Bearing Restrictions: No      Mobility  Bed Mobility               General bed mobility comments: pt seated in recliner when PT arrived    Transfers Overall transfer level: Needs assistance Equipment used: Rolling walker (2 wheels) Transfers: Sit to/from Stand Sit to Stand: Supervision           General transfer comment: cues for proper UE placement    Ambulation/Gait Ambulation/Gait assistance: Supervision Gait Distance (Feet): 85 Feet Assistive device: Rolling walker (2 wheels) Gait Pattern/deviations: Step-through pattern, Trunk flexed Gait velocity: decreased     General Gait Details: cues for posture and proper distance from RW  Stairs            Wheelchair Mobility     Tilt Bed    Modified Rankin (Stroke Patients Only)       Balance Overall balance assessment: Mild deficits observed, not formally tested (pt denies falls)  Pertinent Vitals/Pain Pain Assessment Pain Assessment: No/denies pain    Home Living Family/patient expects to be discharged to:: Private residence Living Arrangements: Children Available Help at Discharge: Family Type of Home: House Home Access: Stairs to enter Entrance Stairs-Rails: Right;Left;Can reach both Entrance Stairs-Number of Steps: 4   Home Layout: One level Home Equipment: Agricultural consultant (2  wheels);Shower seat;Cane - single point;BSC/3in1;Grab bars - tub/shower;Hand held shower head Additional Comments: no supplemental O2 PLOF    Prior Function Prior Level of Function : Independent/Modified Independent             Mobility Comments: pt reports furniture cruzing and not using AD in home setting       Hand Dominance   Dominant Hand: Right    Extremity/Trunk Assessment        Lower Extremity Assessment Lower Extremity Assessment: Overall WFL for tasks assessed    Cervical / Trunk Assessment Cervical / Trunk Assessment: Kyphotic (head forward)  Communication   Communication: No difficulties  Cognition Arousal/Alertness: Awake/alert Behavior During Therapy: WFL for tasks assessed/performed Overall Cognitive Status: Within Functional Limits for tasks assessed                                          General Comments      Exercises     Assessment/Plan    PT Assessment Patient needs continued PT services  PT Problem List Decreased activity tolerance;Decreased balance;Decreased mobility;Cardiopulmonary status limiting activity       PT Treatment Interventions DME instruction;Gait training;Stair training;Functional mobility training;Therapeutic activities;Therapeutic exercise;Balance training;Neuromuscular re-education;Patient/family education    PT Goals (Current goals can be found in the Care Plan section)  Acute Rehab PT Goals Patient Stated Goal: to go home today PT Goal Formulation: With patient Time For Goal Achievement: 04/16/23 Potential to Achieve Goals: Good    Frequency Min 1X/week     Co-evaluation               AM-PAC PT "6 Clicks" Mobility  Outcome Measure Help needed turning from your back to your side while in a flat bed without using bedrails?: A Little Help needed moving from lying on your back to sitting on the side of a flat bed without using bedrails?: A Little Help needed moving to and from a bed to  a chair (including a wheelchair)?: A Little Help needed standing up from a chair using your arms (e.g., wheelchair or bedside chair)?: A Little Help needed to walk in hospital room?: A Little Help needed climbing 3-5 steps with a railing? : A Lot 6 Click Score: 17    End of Session Equipment Utilized During Treatment: Gait belt;Oxygen Activity Tolerance: Patient tolerated treatment well;Other (comment) (desaturated on RA with functional mobility tasks) Patient left: in chair;with call bell/phone within reach Nurse Communication: Mobility status;Other (comment) (O2 saturation) PT Visit Diagnosis: Unsteadiness on feet (R26.81);Difficulty in walking, not elsewhere classified (R26.2)    Time: 9147-8295 PT Time Calculation (min) (ACUTE ONLY): 24 min   Charges:   PT Evaluation $PT Eval Low Complexity: 1 Low PT Treatments $Gait Training: 8-22 mins PT General Charges $$ ACUTE PT VISIT: 1 Visit         Johnny Bridge, PT Acute Rehab   Jacqualyn Posey 03/30/2023, 12:11 PM

## 2023-03-30 NOTE — TOC Transition Note (Addendum)
Transition of Care [facility]) - CM/SW Discharge Note   Patient Details  Name: [name] MRN: [id number] Date of Birth: Jan 19, 1929  Transition of [facility]) CM/SW Contact:  Beckie Busing, RN Phone Number:[phone]  03/30/2023, 12:01 PM   Clinical Narrative:    Patient with discharge order. Currently there are no TOC needs noted. TOC will sign off.   1345 CM received message that patient will need home O2. PT has entered qualifying note orders have been entered. Home O2 referral has been called to [facility] with [employer]. O2 to be delivered to bedside.   1400 Patient no with new referral for home health. CM at bedside to offer patient choice. Patient states that she has had [facility] before and would like them again. Referral has been accepted by [facility] with [facility]. Therapy has also recommended rollator. Orders have been entered and DME referral has been accepted by [facility] with [employer]. No other needs noted at this time. TOC will sign off.    Final next level of care: Home/Self Care Barriers to Discharge: No Barriers Identified   Patient Goals and CMS Choice CMS Medicare.gov Compare Post Acute Care list provided to::  (n/a) Choice offered to / list presented to : NA  Discharge Placement                         Discharge Plan and Services Additional resources added to the After Visit Summary for   In-house Referral: NA Discharge Planning Services: CM Consult Post Acute Care Choice: NA          DME Arranged: N/A DME Agency: NA       HH Arranged: NA HH Agency: NA        Social Determinants of Health (SDOH) Interventions SDOH Screenings   Food Insecurity: No Food Insecurity (03/20/2023)  Housing: Low Risk  (03/20/2023)  Transportation Needs: No Transportation Needs (03/20/2023)  Utilities: Not At Risk (03/20/2023)  Financial Resource Strain: Medium Risk (02/23/2023)  Tobacco Use: Medium Risk (03/19/2023)     Readmission Risk Interventions    03/23/2023    4:04 PM 01/19/2023    12:24 PM  Readmission Risk Prevention Plan  Post Dischage Appt  Complete  Medication Screening  Complete  Transportation Screening Complete Complete  Medication Review (RN Futures trader) Complete   PCP or Specialist appointment within 3-5 days of discharge Complete   HRI or Home Care Consult Complete   SW Recovery Care/Counseling Consult Complete   Palliative Care Screening Complete   Skilled Nursing Facility Complete

## 2023-03-30 NOTE — Evaluation (Signed)
Occupational Therapy Evaluation Patient Details Name: [name] MRN: [id number] DOB: 1929-06-16 Today's Date: 03/30/2023   History of Present Illness 89 yo female presents to therapy following hospital admission on 03/19/2023 due to melena, rectal pain and nausea. 7/10 pt underwent esophagogastroduenoscopy with propofol and 7/11 flexible sigmoidscopy. 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 01/19/2023 pt underwent flexible sigmoidoscopy.  Pt PMH includes but is not limited to:  AVM, GERD, DDD, DM II, lynch syndrome, CVA, HLD, asthma, anemia, intermittent GI bleed and rectal ca.   Clinical Impression   The pt is currently presenting slightly below her baseline level of functioning for self-care management, given the below listed deficits (see OT problem list). She currently requires SBA to occasional min guard assist for tasks, including sit to stand, lower body dressing, and ambulating using a RW. She denies use of O2 at her baseline, however was noted to be on 2L O2 via nasal cannula today. She presented with possible O2 desaturation to the 80's on room air during the session (inconsistent wave form readings on pulse oximeter). She will benefit from further OT services to maximize her independence with self-care tasks and to facilitate her safe return home. Home health therapy is recommended, however the pt declines such services.      Recommendations for follow up therapy are one component of a multi-disciplinary discharge planning process, led by the attending physician.  Recommendations may be updated based on patient status, additional functional criteria and insurance authorization.   Assistance Recommended at Discharge Intermittent Supervision/Assistance  Patient can return home with the following Assistance with cooking/housework;Assist for  transportation;Direct supervision/assist for medications management    Functional Status Assessment  Patient has had a recent decline in their functional status and demonstrates the ability to make significant improvements in function in a reasonable and predictable amount of time.  Equipment Recommendations  None recommended by OT    Recommendations for Other Services       Precautions / Restrictions Precautions Precautions: Fall Restrictions Weight Bearing Restrictions: No Other Position/Activity Restrictions: monitor O2 saturation      Mobility Bed Mobility               General bed mobility comments: pt was received seated in the bedside chair    Transfers Overall transfer level: Needs assistance Equipment used: None Transfers: Sit to/from Stand Sit to Stand: Supervision                  Balance Overall balance assessment: Mild deficits observed, not formally tested                                         ADL either performed or assessed with clinical judgement   ADL Overall ADL's : Needs assistance/impaired Eating/Feeding: Independent;Sitting   Grooming: Supervision/safety;Standing Grooming Details (indicate cue type and reason): at sink level         Upper Body Dressing : Set up;Sitting   Lower Body Dressing: Set up Lower Body Dressing Details (indicate cue type and reason): She performed sock management in sitting at chair level. Toilet Transfer: Ambulation;Rolling walker (2 wheels);Min guard   Toileting- Architect and Hygiene: Min guard;Sit to/from stand               Vision   Additional Comments:  She correctly read the time depicted on the wall clock.     Perception     Praxis      Pertinent Vitals/Pain Pain Assessment Pain Assessment: No/denies pain     Hand Dominance Right   Extremity/Trunk Assessment Upper Extremity Assessment Upper Extremity Assessment: Overall WFL for tasks assessed    Lower Extremity Assessment Lower Extremity Assessment: Overall WFL for tasks assessed   Cervical / Trunk Assessment Cervical / Trunk Assessment: Kyphotic (head forward)   Communication Communication Communication: No difficulties   Cognition Arousal/Alertness: Awake/alert Behavior During Therapy: WFL for tasks assessed/performed Overall Cognitive Status: Within Functional Limits for tasks assessed                                 General Comments: Oriented x4, able to follow commands without difficulty     General Comments       Exercises     Shoulder Instructions      Home Living Family/patient expects to be discharged to:: Private residence Living Arrangements: Children Available Help at Discharge: Family Type of Home: House Home Access: Stairs to enter Secretary/administrator of Steps: 4 Entrance Stairs-Rails: Right;Left;Can reach both Home Layout: One level     Bathroom Shower/Tub: Chief Strategy Officer: Standard Bathroom Accessibility: Yes   Home Equipment: Agricultural consultant (2 wheels);Shower seat;Cane - single point;BSC/3in1;Grab bars - tub/shower;Hand held shower head   Additional Comments: no supplemental O2 PLOF      Prior Functioning/Environment Prior Level of Function : Independent/Modified Independent             Mobility Comments: pt reports furniture cruzing and not using AD in home setting ADLs Comments: She was modifed independent to independent with ADLs, cooking, and cooking. She does not drive.        OT Problem List: Decreased strength;Impaired balance (sitting and/or standing);Decreased knowledge of use of DME or AE;Cardiopulmonary status limiting activity      OT Treatment/Interventions: Self-care/ADL training;Therapeutic exercise;Energy conservation;DME and/or AE instruction;Therapeutic activities;Balance training;Patient/family education    OT Goals(Current goals can be found in the care plan section) Acute  Rehab OT Goals Patient Stated Goal: to return home soon OT Goal Formulation: With patient Time For Goal Achievement: 04/13/23 Potential to Achieve Goals: Good ADL Goals Pt Will Perform Grooming: with modified independence;standing Pt Will Perform Lower Body Dressing: with modified independence;sit to/from stand Pt Will Transfer to Toilet: with modified independence;ambulating Pt Will Perform Toileting - Clothing Manipulation and hygiene: with modified independence;sit to/from stand  OT Frequency: Min 1X/week    Co-evaluation              AM-PAC OT "6 Clicks" Daily Activity     Outcome Measure Help from another person eating meals?: None Help from another person taking care of personal grooming?: None Help from another person toileting, which includes using toliet, bedpan, or urinal?: A Little Help from another person bathing (including washing, rinsing, drying)?: A Little Help from another person to put on and taking off regular upper body clothing?: None Help from another person to put on and taking off regular lower body clothing?: A Little 6 Click Score: 21   End of Session Equipment Utilized During Treatment: Gait belt;Rolling walker (2 wheels);Oxygen Nurse Communication: Mobility status  Activity Tolerance: Other (comment) (possible O2 desaturation on room air with activity) Patient left: in chair;with call bell/phone within reach;with chair alarm set  OT Visit Diagnosis: Muscle weakness (  generalized) (M62.81)                Time: 1610-9604 OT Time Calculation (min): 16 min Charges:  OT General Charges $OT Visit: 1 Visit OT Evaluation $OT Eval Moderate Complexity: 1 Mod    Ruddy Swire L Kellie Murrill, OTR/L 03/30/2023, 12:37 PM

## 2023-03-30 NOTE — Progress Notes (Addendum)
Physical Therapy Treatment Patient Details Name: [name] MRN: [id number] DOB: [birth year] Today's Date: 03/30/2023   History of Present Illness 89 yo female presents to therapy following hospital admission on 03/19/2023 due to melena, rectal pain and nausea. 7/10 pt underwent esophagogastroduenoscopy with propofol and 7/11 flexible sigmoidscopy. 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 01/19/2023 pt underwent flexible sigmoidoscopy.  Pt PMH includes but is not limited to:  AVM, GERD, DDD, DM II, lynch syndrome, CVA, HLD, asthma, anemia, intermittent GI bleed and rectal ca.    PT Comments  Stopped in hallway by RN to perform a "walking oxygen sat".  Pt was evaluated by LPT earlier.  General transfer comment: pt in bathroom able to self transfer, perform self peri care and wash hands.  Found in bathroom without O2 sats 90% at rest. General Gait Details: VC's for propr walker to self distance and upright posture.  Performed  a "walking O2 sat" in which RA decreased to 83% and required 2 lts to achieve sats >90%.  Decreased amb distance due to c/o fatigue and mild dyspnea.  Instructed on use of supplemental oxygen and "pures lip breathing" using Teach Back Method.  Pt has an old Rollator at home she has not used for some time.  Due to need for oxygen and limited activity toerance, pt would like a new Rollator "If covered by insurance".  Relayed info to TOC. Pt plans to return home,     SATURATION QUALIFICATIONS: (This note is used to comply with regulatory documentation for home oxygen)   Patient Saturations on Room Air at Rest = 90%   Patient Saturations on Room Air while Ambulating 55 feet= 83%   Patient Saturations on 2 Liters of oxygen while Ambulating = 92%   Please briefly explain why patient needs home oxygen:  Pt requires supplemental oxygen to  achieve therapeutic levels.      Assistance Recommended at Discharge Intermittent Supervision/Assistance  If plan is discharge home, recommend the following:  Can travel by private vehicle    A little help with walking and/or transfers;A little help with bathing/dressing/bathroom;Assistance with cooking/housework;Assist for transportation;Help with stairs or ramp for entrance      Equipment Recommendations  Rollator (4 wheels)  Home oxygen  Recommendations for Other Services       Precautions / Restrictions Precautions Precautions: Fall Restrictions Weight Bearing Restrictions: No Other Position/Activity Restrictions: monitor O2 saturation     Mobility  Bed Mobility               General bed mobility comments: OOB in bathroom    Transfers Overall transfer level: Needs assistance Equipment used: Rolling walker (2 wheels) Transfers: Sit to/from Stand Sit to Stand: Supervision           General transfer comment: pt in bathroom able to self transfer, perform self peri care and wash hands.  Found in bathroom without O2 sats 90% at rest.    Ambulation/Gait Ambulation/Gait assistance: Supervision Gait Distance (Feet): 55 Feet Assistive device: Rolling walker (2 wheels) Gait Pattern/deviations: Step-through pattern, Trunk flexed Gait velocity: decreased     General Gait Details: VC's for propr walker to self distance and upright posture.  Performed  a "walking O2 sat" in which RA decreased to 83% and required 2 lts to achieve sats >90%.  Decreased amb distance due to c/o fatigue and mild dyspnea.  Instructed on use of supplemental  oxygen and "pures lip breathing" using Teach Back Method.  Pt has an old Rollator at home she has not used for some time.  Due to need for oxygen and limited activity toerance, pt would like a new Rollator "If covered by insurance".  Relayed info to TOC.   Stairs             Wheelchair Mobility     Tilt Bed    Modified  Rankin (Stroke Patients Only)       Balance                                            Cognition Arousal/Alertness: Awake/alert   Overall Cognitive Status: Within Functional Limits for tasks assessed                                 General Comments: AxO x 3 very pleasant Lady following all commands        Exercises      General Comments        Pertinent Vitals/Pain Pain Assessment Pain Assessment: No/denies pain    Home Living Family/patient expects to be discharged to:: Private residence Living Arrangements: Children Available Help at Discharge: Family Type of Home: House Home Access: Stairs to enter Entrance Stairs-Rails: Right;Left;Can reach both Entrance Stairs-Number of Steps: 4   Home Layout: One level Home Equipment: Agricultural consultant (2 wheels);Shower seat;Cane - single point;BSC/3in1;Grab bars - tub/shower;Hand held shower head Additional Comments: no supplemental O2 PLOF    Prior Function            PT Goals (current goals can now be found in the care plan section) Acute Rehab PT Goals Patient Stated Goal: to go home today PT Goal Formulation: With patient Time For Goal Achievement: 04/16/23 Potential to Achieve Goals: Good Progress towards PT goals: Progressing toward goals    Frequency    Min 1X/week      PT Plan Current plan remains appropriate    Co-evaluation              AM-PAC PT "6 Clicks" Mobility   Outcome Measure  Help needed turning from your back to your side while in a flat bed without using bedrails?: A Little Help needed moving from lying on your back to sitting on the side of a flat bed without using bedrails?: A Little Help needed moving to and from a bed to a chair (including a wheelchair)?: A Little Help needed standing up from a chair using your arms (e.g., wheelchair or bedside chair)?: A Little Help needed to walk in hospital room?: A Little Help needed climbing 3-5 steps with  a railing? : A Lot 6 Click Score: 17    End of Session Equipment Utilized During Treatment: Gait belt;Oxygen Activity Tolerance: Patient tolerated treatment well;Patient limited by fatigue Patient left: in chair;with call bell/phone within reach Nurse Communication: Mobility status PT Visit Diagnosis: Unsteadiness on feet (R26.81);Difficulty in walking, not elsewhere classified (R26.2)     Time: 1400-1410 PT Time Calculation (min) (ACUTE ONLY): 10 min  Charges:    $Gait Training: 8-22 mins PT General Charges $$ ACUTE PT VISIT: 1 Visit                     Felecia Shelling  PTA Acute  Rehabilitation Services  Office M-F          [phone]

## 2023-03-30 NOTE — Discharge Summary (Addendum)
Physician Discharge Summary  [name] AVW:[id number] DOB: 1928/12/04 DOA: 03/19/2023  PCP: Creola Corn, MD  Admit date: 03/19/2023 Discharge date: 03/30/2023 30 Day Unplanned Readmission Risk Score    Flowsheet Row ED to Hosp-Admission (Current) from 03/19/2023 in [facility]  30 Day Unplanned Readmission Risk Score (%) 36.24 Filed at 03/30/2023 0801       This score is the patient's risk of an unplanned readmission within 30 days of being discharged (0 -100%). The score is based on dignosis, age, lab data, medications, orders, and past utilization.   Low:  0-14.9   Medium: 15-21.9   High: 22-29.9   Extreme: 30 and above          Admitted From: home Disposition:  home  Recommendations for Outpatient Follow-up:  Follow up with PCP in 1-2 weeks Please obtain BMP/CBC in one week Please follow up with your PCP on the following pending results: Unresulted Labs (From admission, onward)    None         Home Health: YES Equipment/Devices:home o2  Discharge Condition:stable  CODE STATUS:full code  Diet recommendation: cardiac  Subjective:seen and examined, feels well, no complaints and she wants to go home since her son's birthday is tomorrow.   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  She reported lightheadedness.  She was evaluated at her oncologist office and she had very small external hemorrhoid, occult blood was positive. Hb 8.7. she was sent for admission. Details below.    -Underwent sigmoidoscopy 7/11: Showed distal rectal mass the size appears to be smaller than the recent sigmoidoscopy/2024.  However very friable mass and would  bleed easily. Her home eliquis was  held. IR consulted and placed a port-a-cath 7/15 for chemotherapy which is tender today and unable to draw labs but per nursing report, was able to administer medications without difficulty.  Acute blood loss symptomatic anemia secondary to GI Bleed In setting of rectal cancer, mass and eliquis.  -She has received total 2 units PRBC this hospitalization.  -Underwent Endoscopy  7/10: Small hiatal hernia. Gastritis. Biopsied. A few benign gastric polyps. 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 Treated with argon plasma coagulation (APC). -CTA; negative for active bleeding.  -Underwent sigmoidoscopy 7/11: Showed distal rectal mass the size appears to be smaller than the recent sigmoidoscopy/2024.  However very friable mass and would  bleed easily.  GI commended continuing to hold off to Eliquis if okay with cardiology and oncology.  Oncology/Dr. Myna Hidalgo has been holding her Eliquis and has discussed with patent and it has been mutually decided to hold a/c indefinitely for now until and unless needed or highly recommended by her cardiologist who she will f/u as outpatient. She has remained stable for several days and is being discharged today after clearance from oncologist,.  Patient underwent Port-A-Cath placement by IR.   Acute hypoxic respiratory failure secondary to multilobar pneumonia- multifactorial. No baseline oxygen requirement. Still requiring 2 l ox and will be sent home with home o2.  Also new nodule in right middle lobe favored to be inflammatory but warrants repeat imaging in 12 months, per radiology guidelines.  - she completed abx course with rocephin and zithromax.    UTI; UA with positive nitrates.  Urine culture grew E. coli.  Patient  received Rocephin.     Hyperlipidemia: discontinued statin due to LDL very low 59.   Diabetes : Sugar controlled, resume home meds.   HTN: resume home meds   Hypothyroidism: Continue with Synthroid   Rectal cancer:  Follow-up  with Dr. Myna Hidalgo.   PAF: rate controlled. Continue to hold Eliquis in the setting of GI bleed, plan to hold indefinitely for now. Needs to follow up with Dr Melburn Popper out patient  Continue with amiodarone  Discharge plan was discussed with patient and/or family member and they verbalized understanding and agreed with it.  Discharge Diagnoses:  Principal Problem:   Upper GI bleed Active Problems:   Symptomatic anemia   Hyperlipidemia   Hypothyroidism   Diabetes mellitus without complication (HCC)   HTN (hypertension)   Rectal cancer (HCC)   Paroxysmal atrial fibrillation (HCC)   Wheezing   GI bleed   Anticoagulated by anticoagulation treatment   Multifocal pneumonia   Acute cystitis without hematuria    Discharge Instructions   Allergies as of 03/30/2023       Reactions   Prednisone Other (See Comments)   Climbs the walls   Aggrenox [aspirin-dipyridamole Er]    Severe Bleeding and Stomach Pain        Medication List     STOP taking these medications    apixaban 5 MG Tabs tablet Commonly known as: ELIQUIS       TAKE these medications    acetaminophen 325 MG tablet Commonly known as: TYLENOL Take 2 tablets (650 mg total) by mouth every 6 (six) hours as needed for mild pain (or Fever >/= 101).   ALPRAZolam 0.5 MG tablet Commonly known as: XANAX Take 0.5 mg by mouth 3 (three) times daily as needed for anxiety. For anxiety   amiodarone 200 MG tablet Commonly known as: Pacerone Take 1 tablet (200 mg total) by mouth daily. DO NOT START THIS MEDICATION UNTIL PREVIOUS DOSE (400mg ) HAS BEEN COMPLETED What changed: additional instructions   amLODipine 5 MG tablet Commonly known as: NORVASC Take 1.5 tablets (7.5 mg total) by mouth daily.   atorvastatin 20 MG tablet Commonly known as: LIPITOR Take 20 mg by mouth daily.   capecitabine 500 MG tablet Commonly known as: XELODA Take 3 tablets (1,500 mg total) by mouth 2 (two) times daily after a meal. Take within  30 minutes after meals. Take for 14 days on, 7 days off. Repeat every 21 days.   cholecalciferol 25 MCG (1000 UNIT) tablet Commonly known as: VITAMIN D3 Take 1,000 Units by mouth daily.   cyanocobalamin 1000 MCG tablet Take 1 tablet (1,000 mcg total) by mouth daily.   escitalopram 5 MG tablet Commonly known as: LEXAPRO Take 5 mg by mouth at bedtime.   feeding supplement Liqd Take 237 mLs by mouth 3 (three) times daily with meals. What changed:  when to take this reasons to take this   fluticasone 50 MCG/ACT nasal spray Commonly known as: FLONASE Place 2 sprays into both nostrils daily. What changed:  when to take this reasons to take this   guaiFENesin-dextromethorphan 100-10 MG/5ML syrup Commonly known as: ROBITUSSIN DM Take 10 mLs by mouth every 4 (four) hours as needed for cough.   HYDROcodone-acetaminophen 5-325 MG tablet Commonly known as: NORCO/VICODIN Take 1-2 tablets by mouth every 4 (four) hours as needed for moderate pain.   levothyroxine 125 MCG tablet Commonly known as: SYNTHROID Take 125 mcg by mouth daily before breakfast.   lidocaine-prilocaine cream Commonly known as: EMLA Apply to  affected area once   loratadine 10 MG tablet Commonly known as: CLARITIN Take 1 tablet (10 mg total) by mouth daily.   methocarbamol 500 MG tablet Commonly known as: ROBAXIN Take 1 tablet (500 mg total) by mouth every 8 (eight) hours as needed for muscle spasms (abd pain).   Pepcid 20 MG tablet Generic drug: famotidine Take 20 mg by mouth daily.   polyethylene glycol 17 g packet Commonly known as: MIRALAX / GLYCOLAX Take 17 g by mouth daily as needed for mild constipation. What changed: when to take this   potassium chloride SA 20 MEQ tablet Commonly known as: KLOR-CON M Take 20 mEq by mouth daily.   prochlorperazine 10 MG tablet Commonly known as: COMPAZINE Take 1 tablet (10 mg total) by mouth every 6 (six) hours as needed for nausea or vomiting.         Follow-up Information     Creola Corn, MD Follow up in 1 week(s).   Specialty: Internal Medicine Contact information: [address] [place] [address] [phone]                Allergies  Allergen Reactions   Prednisone Other (See Comments)    Climbs the walls   Aggrenox [Aspirin-Dipyridamole Er]     Severe Bleeding and Stomach Pain    Consultations: oncology and GI   Procedures/Studies: DG Chest 2 View  Result Date: 03/29/2023 CLINICAL DATA:  Pneumonia EXAM: CHEST - 2 VIEW COMPARISON:  Chest radiograph dated 03/23/2023 FINDINGS: Lines/tubes: Right chest wall port tip projects over the superior cavoatrial junction. Lungs: Increased multifocal patchy opacities, right-greater-than-left. Pleura: New blunting of the right costophrenic angle. No pneumothorax. Heart/mediastinum: The heart size and mediastinal contours are within normal limits. Bones: No acute osseous abnormality. IMPRESSION: 1. Increased multifocal patchy opacities, right-greater-than-left, in keeping with known multifocal pneumonia. 2. New small right pleural effusion. Electronically Signed   By: Agustin Cree M.D.   On: 03/29/2023 09:15   IR IMAGING GUIDED PORT INSERTION  Result Date: 03/26/2023 INDICATION: 89 year old female with history of rectal mass and poor venous access. EXAM: IMPLANTED PORT A CATH PLACEMENT WITH ULTRASOUND AND FLUOROSCOPIC GUIDANCE COMPARISON:  None Available. MEDICATIONS: None. ANESTHESIA/SEDATION: Moderate (conscious) sedation was employed during this procedure. A total of Versed 1 mg and Fentanyl 50 mcg was administered intravenously. Moderate Sedation Time: 14 minutes. The patient's level of consciousness and vital signs were monitored continuously by radiology nursing throughout the procedure under my direct supervision. CONTRAST:  None FLUOROSCOPY TIME:  0.1 mGy COMPLICATIONS: None immediate. PROCEDURE: The procedure, risks, benefits, and alternatives were explained to the patient.  Questions regarding the procedure were encouraged and answered. The patient understands and consents to the procedure. The right neck and chest were prepped with chlorhexidine in a sterile fashion, and a sterile drape was applied covering the operative field. Maximum barrier sterile technique with sterile gowns and gloves were used for the procedure. A timeout was performed prior to the initiation of the procedure. Ultrasound was used to examine the jugular vein which was compressible and free of internal echoes. A skin marker was used to demarcate the planned venotomy and port pocket incision sites. Local anesthesia was provided to these sites and the subcutaneous tunnel track with 1% lidocaine with 1:100,000 epinephrine. A small incision was created at the jugular access site and blunt dissection was performed of the subcutaneous tissues. Under ultrasound guidance, the jugular vein was accessed with a 21 ga micropuncture needle and an 0.018" wire was inserted to  the superior vena cava. Real-time ultrasound guidance was utilized for vascular access including the acquisition of a permanent ultrasound image documenting patency of the accessed vessel. A 5 Fr micopuncture set was then used, through which a 0.035" Rosen wire was passed under fluoroscopic guidance into the inferior vena cava. An 8 Fr dilator was then placed over the wire. A subcutaneous port pocket was then created along the upper chest wall utilizing a combination of sharp and blunt dissection. The pocket was irrigated with sterile saline, packed with gauze, and observed for hemorrhage. A single lumen clear view power injectable port was chosen for placement. The 8 Fr catheter was tunneled from the port pocket site to the venotomy incision. The port was placed in the pocket. The external catheter was trimmed to appropriate length. The dilator was exchanged for an 8 Fr peel-away sheath under fluoroscopic guidance. The catheter was then placed through the  sheath and the sheath was removed. Final catheter positioning was confirmed and documented with a fluoroscopic spot radiograph. The port was accessed with a Huber needle, aspirated, and flushed with heparinized saline. The deep dermal layer of the port pocket incision was closed with interrupted 3-0 Vicryl suture. Dermabond was then placed over the port pocket and neck incisions. The patient tolerated the procedure well without immediate post procedural complication. FINDINGS: After catheter placement, the tip lies within the superior cavoatrial junction. The catheter aspirates and flushes normally and is ready for immediate use. IMPRESSION: Successful placement of a power injectable Port-A-Cath via the right internal jugular vein. The catheter is ready for immediate use. Marliss Coots, MD [facility] Radiology Electronically Signed   By: Marliss Coots M.D.   On: 03/26/2023 15:20   CT CHEST W CONTRAST  Result Date: 03/25/2023 CLINICAL DATA:  Pneumonia EXAM: CT CHEST WITH CONTRAST TECHNIQUE: Multidetector CT imaging of the chest was performed during intravenous contrast administration. RADIATION DOSE REDUCTION: This exam was performed according to the departmental dose-optimization program which includes automated exposure control, adjustment of the mA and/or kV according to patient size and/or use of iterative reconstruction technique. CONTRAST:  75mL OMNIPAQUE IOHEXOL 300 MG/ML  SOLN COMPARISON:  Chest radiograph 03/23/2023 and chest CT 01/20/2023 FINDINGS: Cardiovascular: Coronary, aortic arch, and branch vessel atherosclerotic vascular disease. Mitral and aortic valve calcifications. Mediastinum/Nodes: Subcarinal lymph node 1.4 cm in short axis on image 66 series 2, formerly the same on 01/20/2023. Right hilar lymph node 0.9 cm in short axis on image 65 series 2, stable. Lungs/Pleura: Centrilobular emphysema. Biapical pleuroparenchymal scarring. Patchy airspace  opacities most notable in the right upper lobe but also scattered in the left upper lobe and both lower lobes. Passive atelectasis associated with small bilateral pleural effusions. Peripheral consolidation disproportionate to passive atelectasis in the right lower lobe on image 80 series 2, probably from pneumonia or possibly rounded atelectasis. New 7 by 5 by 4 mm (volume = 70 mm^3) nodule superiorly in the right middle lobe on image 70 series 8, not present on 01/20/2023 hence likely to be inflammatory. Stable 3 mm right middle lobe nodule on image 95 series 8. Upper Abdomen: Abdominal aortic atherosclerosis. Musculoskeletal: Lower thoracic spondylosis. Degenerative glenohumeral arthropathy on the left. IMPRESSION: 1. Patchy airspace opacities in the lungs, most notable in the right upper lobe, compatible with multilobar pneumonia. 2. Small bilateral pleural effusions with associated passive atelectasis. 3. Peripheral consolidation in the right lower lobe disproportionate to passive atelectasis, probably from pneumonia or possibly rounded atelectasis. 4. New 0.7 cm  nodule superiorly in the right middle lobe, not present on 01/20/2023, probably inflammatory given the timing. No follow-up needed if patient is low-risk. Non-contrast chest CT can be considered in 12 months if patient is high-risk. This recommendation follows the consensus statement: Guidelines for Management of Incidental Pulmonary Nodules Detected on CT Images: From the Fleischner Society 2017; Radiology 2017; 284:228-243. 5. Coronary atherosclerosis mitral and aortic valve calcifications. Aortic Atherosclerosis (ICD10-I70.0) and Emphysema (ICD10-J43.9). Electronically Signed   By: Gaylyn Rong M.D.   On: 03/25/2023 15:05   DG Chest 2 View  Result Date: 03/23/2023 CLINICAL DATA:  Wheezing. EXAM: CHEST - 2 VIEW COMPARISON:  March 19, 2023. FINDINGS: Stable cardiomediastinal silhouette. Stable nodular density is noted laterally in the right  upper lobe. Immediately below it is a new opacity concerning for possible pneumonia. Left lung is clear. Bony thorax is unremarkable. IMPRESSION: Stable nodular density seen laterally in right upper lobe as seen on prior exam and concerning for nodule as noted on prior CT scan of Jan 20, 2023. However, new opacity is seen inferior to this nodule in right upper lobe concerning for possible pneumonia. Electronically Signed   By: Lupita Raider M.D.   On: 03/23/2023 14:40   CT ANGIO GI BLEED  Result Date: 03/20/2023 CLINICAL DATA:  Patient with history of colorectal cancer, had a positive occult fecal blood test at the oncologists office today. Being admitted for further workup. Hemoglobin was 8.2. EXAM: CTA ABDOMEN AND PELVIS WITHOUT AND WITH CONTRAST TECHNIQUE: Multidetector CT imaging of the abdomen and pelvis was performed using the standard protocol during bolus administration of intravenous contrast. Multiplanar reconstructed images and MIPs were obtained and reviewed to evaluate the vascular anatomy. RADIATION DOSE REDUCTION: This exam was performed according to the departmental dose-optimization program which includes automated exposure control, adjustment of the mA and/or kV according to patient size and/or use of iterative reconstruction technique. CONTRAST:  OMNIPAQUE IOHEXOL 350 MG/ML SOLN COMPARISON:  CT abdomen pelvis with IV contrast 01/17/2023, CTA chest 01/20/2023. FINDINGS: VASCULAR Aorta: Heavily calcified. Nonaneurysmal fusiform infrarenal ectasia seen to 2.2 cm. No critical stenosis in the aorta. Celiac: There are ostial calcific plaques superiorly. This causes as much as a 50% vessel origin stenosis. There is mild poststenotic ectasia without aneurysm, dissection or further stenosis. No branch occlusion is seen. SMA: There are ostial wall plaques, additional plaque just proximal to the inflection point, but no flow-limiting stenosis. No branch occlusion. Renals: 2 arteries supply each  kidney. On the left there is a very small accessory artery to the upper pole, and a dominant artery arising just inferior to it. The accessory artery is widely patent. The dominant artery has nonstenosing calcific ostial plaques but is widely patent. On the right, the dominant artery arises first. There are ostial wall plaques anteriorly but they are nonstenosing. The smaller lower pole artery arises from the right anterior aortic wall 1.5 cm distal to this and is widely patent. There are no right renal artery branch occlusions, as far as visualized. IMA: Widely patent.  No branch occlusion. Inflow: There are bilateral heavily calcified common iliac arteries with again noted thrombotic occlusion over most of the right common iliac artery and up to 80% calcific stenosis in mid left common iliac artery. Stenotic flow is visible in the distal right common iliac artery. 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 There are patchy calcifications in the internal iliac arteries without flow-limiting narrowing. The  external iliac arteries are tortuous, both showing mild ostial plaques but neither demonstrating stenosis or further plaques. Proximal Outflow: Bilateral common femoral and visualized portions of the superficial and profunda femoral arteries are patent without evidence of aneurysm, dissection, vasculitis or significant stenosis. There are mild nonstenosing calcific plaques in the common femoral arteries. Veins: Patent. Review of the MIP images confirms the above findings. NON-VASCULAR Lower chest: The heart is normal slightly enlarged. There are calcifications in the mitral ring, right coronary artery. The noncontrast imaging demonstrates low-density intraventricular blood pool compared to the myocardium consistent with anemia. Small hiatal hernia.  Visualized lung bases are clear. Hepatobiliary: 19 cm in length mildly steatotic liver. No mass enhancement. Old  cholecystectomy. Stable mild biliary prominence. Pancreas: No abnormality. Spleen: No mass enhancement.  No splenomegaly. Adrenals/Urinary Tract: There is no adrenal mass. Both kidneys enhance homogeneously except for again noted 1.3 cm posterior lower pole right renal cyst with a Hounsfield density of 20. No follow-up imaging is recommended. There is no urinary stone or obstruction. There is trace air in the anterior bladder lumen which could be due to instrumentation, catheterization or infectious process, but there is no bladder thickening. Stomach/Bowel: No contrast extravasation is seen into the bowel. Mild thickened folds are again noted in the stomach and jejunum but without inflammatory changes or small-bowel obstruction. An appendix is not seen in this patient. There are uncomplicated sigmoid diverticula. The large bowel wall is otherwise unremarkable except possibly at the anorectal junction where there may be eccentric wall thickening to the left and posteriorly. Direct visualization is recommended. Lymphatic: No appreciable adenopathy. Reproductive: Uterus and bilateral adnexa are unremarkable. Other: Small umbilical fat hernia. No incarcerated hernia. No free air, free fluid or free hemorrhage. Musculoskeletal: Osteopenia and degenerative change lumbar spine. There is advanced L4-5 facet hypertrophy with grade 1 spondylolisthesis and severe acquired spinal stenosis. Additional advanced facet hypertrophy at L5-S1 with grade 1 spondylolisthesis without significant spinal stenosis. There is thoracic spondylosis. No acute fractures. IMPRESSION: 1. Heavily calcified aorta with nonaneurysmal ectasia in the infrarenal segment to 2.2 cm. 2. 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 There is reconstitution in the distal right common iliac artery 3. 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 4.  No other significant visceral artery stenoses. 5. No visible contrast extravasation into the bowel. 6. Possible eccentric wall thickening at the anorectal junction to the left and posteriorly. Direct visualization is recommended. 7. Trace air in the anterior bladder lumen which could be due to instrumentation, catheterization or infectious process, but there is no bladder thickening. 8. Mildly thickened folds in the stomach and jejunum but without inflammatory changes or small-bowel obstruction. Findings could be due to gastroenteritis or nondistention. 9. Mild hepatic steatosis. 10. Small hiatal hernia. 11. Umbilical fat hernia. 12. Osteopenia and degenerative change. 13. L4-5 severe acquired spinal stenosis. Electronically Signed   By: Almira Bar M.D.   On: 03/20/2023 00:30   DG Chest 2 View  Result Date: 03/20/2023 CLINICAL DATA:  Wheezing. EXAM: CHEST - 2 VIEW COMPARISON:  Chest radiograph 01/26/2023.  Chest CT 01/20/2023 FINDINGS: Chronic bronchial thickening. Right upper lobe nodular opacity in the periphery is more peripherally located than nodule on prior CT. No new airspace disease. No pleural fluid. The heart is normal in size. Aortic atherosclerosis. No pneumothorax. On limited assessment, no acute osseous findings. IMPRESSION: 1. Chronic bronchial thickening. 2. Right  upper lobe nodular opacity is more peripherally located than nodule on prior CT. Unclear if this is the same nodule or a new nodular density. Electronically Signed   By: Narda Rutherford M.D.   On: 03/20/2023 00:01     Discharge Exam: Vitals:   03/30/23 0419 03/30/23 0855  BP: (!) 148/65   Pulse: 72   Resp:    Temp: 98 F (36.7 C)   SpO2: 95% 94%   Vitals:   03/29/23 1354 03/29/23 2011 03/30/23 0419 03/30/23 0855  BP: (!) 125/52 (!) 147/57 (!) 148/65   Pulse: 72 73 72   Resp: 16 17    Temp: 98.2 F (36.8 C) 99.6 F (37.6 C) 98 F (36.7 C)   TempSrc: Oral Oral Oral   SpO2: 96% 99% 95% 94%  Weight:      Height:         General: Pt is alert, awake, not in acute distress Cardiovascular: RRR, S1/S2 +, no rubs, no gallops Respiratory: CTA bilaterally, no wheezing, no rhonchi Abdominal: Soft, NT, ND, bowel sounds + Extremities: no edema, no cyanosis    The results of significant diagnostics from this hospitalization (including imaging, microbiology, ancillary and laboratory) are listed below for reference.     Microbiology: Recent Results (from the past 240 hour(s))  Urine Culture     Status: Abnormal   Collection Time: 03/23/23  2:25 AM   Specimen: Urine, Random  Result Value Ref Range Status   Specimen Description   Final    URINE, RANDOM Performed at [facility], [address], [place], [place] [address]    Special Requests   Final    NONE Performed at [facility], [address], [place], [place] [address]    Culture >=100,000 COLONIES/mL ESCHERICHIA COLI (A)  Final   Report Status 03/25/2023 FINAL  Final   Organism ID, Bacteria ESCHERICHIA COLI (A)  Final      Susceptibility   Escherichia coli - MIC*    AMPICILLIN <=2 SENSITIVE Sensitive     CEFAZOLIN <=4 SENSITIVE Sensitive     CEFEPIME <=0.12 SENSITIVE Sensitive     CEFTRIAXONE <=0.25 SENSITIVE Sensitive     CIPROFLOXACIN <=0.25 SENSITIVE Sensitive     GENTAMICIN <=1 SENSITIVE Sensitive     IMIPENEM <=0.25 SENSITIVE Sensitive     NITROFURANTOIN <=16 SENSITIVE Sensitive     TRIMETH/SULFA <=20 SENSITIVE Sensitive     AMPICILLIN/SULBACTAM <=2 SENSITIVE Sensitive     PIP/TAZO <=4 SENSITIVE Sensitive     * >=100,000 COLONIES/mL ESCHERICHIA COLI     Labs: BNP (last 3 results) Recent Labs    01/26/23 0515  BNP 87.1   Basic Metabolic Panel: Recent Labs  Lab 03/26/23 0533 03/27/23 0702 03/28/23 0500 03/29/23 0500 03/30/23 0451  NA 133* 133* 134* 133* 131*  K 3.5 3.6 3.2* 4.1 4.1  CL 97* 97* 98 98 96*  CO2 28 27 29 29 30   GLUCOSE 108* 96 107* 99 90  BUN 12 15 13 12 14    CREATININE 0.67 0.60 0.61 0.54 0.57  CALCIUM 8.3* 8.2* 8.2* 8.4* 8.2*   Liver Function Tests: Recent Labs  Lab 03/26/23 0533 03/27/23 0702 03/28/23 0500 03/29/23 0500 03/30/23 0451  AST 49* 37 28 43* 45*  ALT 77* 66* 53* 60* 62*  ALKPHOS 66 70 57 61 66  BILITOT 1.6* 1.0 1.0 1.3* 1.0  PROT 5.7* 5.8* 5.3* 5.5* 5.7*  ALBUMIN 2.9* 2.9* 2.6* 2.7* 2.7*   No results for input(s): "  LIPASE", "AMYLASE" in the last 168 hours. No results for input(s): "AMMONIA" in the last 168 hours. CBC: Recent Labs  Lab 03/26/23 0533 03/27/23 0702 03/28/23 0500 03/29/23 0500 03/30/23 0451  WBC 5.6 5.8 5.4 5.8 5.9  NEUTROABS 3.7 4.0 3.8 4.2 3.9  HGB 11.5* 11.8* 10.2* 10.5* 10.4*  HCT 35.5* 36.9 32.2* 32.6* 33.0*  MCV 96.7 98.4 98.5 99.1 99.1  PLT 247 249 232 238 236   Cardiac Enzymes: No results for input(s): "CKTOTAL", "CKMB", "CKMBINDEX", "TROPONINI" in the last 168 hours. BNP: Invalid input(s): "POCBNP" CBG: Recent Labs  Lab 03/29/23 1646 03/29/23 2007 03/30/23 0001 03/30/23 0405 03/30/23 0738  GLUCAP 168* 116* 163* 82 97   D-Dimer No results for input(s): "DDIMER" in the last 72 hours. Hgb A1c No results for input(s): "HGBA1C" in the last 72 hours. Lipid Profile No results for input(s): "CHOL", "HDL", "LDLCALC", "TRIG", "CHOLHDL", "LDLDIRECT" in the last 72 hours. Thyroid function studies No results for input(s): "TSH", "T4TOTAL", "T3FREE", "THYROIDAB" in the last 72 hours.  Invalid input(s): "FREET3" Anemia work up No results for input(s): "VITAMINB12", "FOLATE", "FERRITIN", "TIBC", "IRON", "RETICCTPCT" in the last 72 hours. Urinalysis    Component Value Date/Time   COLORURINE YELLOW 03/29/2023 1145   APPEARANCEUR CLEAR 03/29/2023 1145   LABSPEC 1.014 03/29/2023 1145   PHURINE 6.0 03/29/2023 1145   GLUCOSEU NEGATIVE 03/29/2023 1145   HGBUR NEGATIVE 03/29/2023 1145   BILIRUBINUR NEGATIVE 03/29/2023 1145   KETONESUR NEGATIVE 03/29/2023 1145   PROTEINUR NEGATIVE  03/29/2023 1145   UROBILINOGEN 0.2 10/05/2008 1806   NITRITE NEGATIVE 03/29/2023 1145   LEUKOCYTESUR NEGATIVE 03/29/2023 1145   Sepsis Labs Recent Labs  Lab 03/27/23 0702 03/28/23 0500 03/29/23 0500 03/30/23 0451  WBC 5.8 5.4 5.8 5.9   Microbiology Recent Results (from the past 240 hour(s))  Urine Culture     Status: Abnormal   Collection Time: 03/23/23  2:25 AM   Specimen: Urine, Random  Result Value Ref Range Status   Specimen Description   Final    URINE, RANDOM Performed at [facility], [address] [place], [place] [address]    Special Requests   Final    NONE Performed at [facility], [address] [place], [place] [address]    Culture >=100,000 COLONIES/mL ESCHERICHIA COLI (A)  Final   Report Status 03/25/2023 FINAL  Final   Organism ID, Bacteria ESCHERICHIA COLI (A)  Final      Susceptibility   Escherichia coli - MIC*    AMPICILLIN <=2 SENSITIVE Sensitive     CEFAZOLIN <=4 SENSITIVE Sensitive     CEFEPIME <=0.12 SENSITIVE Sensitive     CEFTRIAXONE <=0.25 SENSITIVE Sensitive     CIPROFLOXACIN <=0.25 SENSITIVE Sensitive     GENTAMICIN <=1 SENSITIVE Sensitive     IMIPENEM <=0.25 SENSITIVE Sensitive     NITROFURANTOIN <=16 SENSITIVE Sensitive     TRIMETH/SULFA <=20 SENSITIVE Sensitive     AMPICILLIN/SULBACTAM <=2 SENSITIVE Sensitive     PIP/TAZO <=4 SENSITIVE Sensitive     * >=100,000 COLONIES/mL ESCHERICHIA COLI    FURTHER DISCHARGE INSTRUCTIONS:   Get Medicines reviewed and adjusted: Please take all your medications with you for your next visit with your Primary MD   Laboratory/radiological data: Please request your Primary MD to go over all hospital tests and procedure/radiological results at the follow up, please ask your Primary MD to get all Hospital records sent to his/her office.   In some cases, they will be blood work, cultures and  biopsy results pending at the time of your discharge. Please request that  your primary care M.D. goes through all the records of your hospital data and follows up on these results.   Also Note the following: If you experience worsening of your admission symptoms, develop shortness of breath, life threatening emergency, suicidal or homicidal thoughts you must seek medical attention immediately by calling 911 or calling your MD immediately  if symptoms less severe.   You must read complete instructions/literature along with all the possible adverse reactions/side effects for all the Medicines you take and that have been prescribed to you. Take any new Medicines after you have completely understood and accpet all the possible adverse reactions/side effects.    Do not drive when taking Pain medications or sleeping medications (Benzodaizepines)   Do not take more than prescribed Pain, Sleep and Anxiety Medications. It is not advisable to combine anxiety,sleep and pain medications without talking with your primary care practitioner   Special Instructions: If you have smoked or chewed Tobacco  in the last 2 yrs please stop smoking, stop any regular Alcohol  and or any Recreational drug use.   Wear Seat belts while driving.   Please note: You were cared for by a hospitalist during your hospital stay. Once you are discharged, your primary care physician will handle any further medical issues. Please note that NO REFILLS for any discharge medications will be authorized once you are discharged, as it is imperative that you return to your primary care physician (or establish a relationship with a primary care physician if you do not have one) for your post hospital discharge needs so that they can reassess your need for medications and monitor your lab values  Time coordinating discharge: Over 30 minutes  SIGNED:   Hughie Closs, MD  [facility] 03/30/2023, 11:16 AM *Please note that this is a verbal dictation therefore any spelling or grammatical errors are due to the  "Dragon [employer] One" system interpretation. If 7PM-7AM, please contact night-coverage [email or web address]

## 2023-03-30 NOTE — Plan of Care (Signed)

## 2023-03-30 NOTE — Progress Notes (Signed)
PHYSICAL THERAPY  SATURATION QUALIFICATIONS: (This note is used to comply with regulatory documentation for home oxygen)  Patient Saturations on Room Air at Rest = 90%  Patient Saturations on Room Air while Ambulating 55 feet= 83%  Patient Saturations on 2 Liters of oxygen while Ambulating = 92%  Please briefly explain why patient needs home oxygen:  Pt requires supplemental oxygen to achieve therapeutic levels.  Felecia Shelling  PTA Acute  Rehabilitation Services Office M-F          [phone]

## 2023-03-31 DIAGNOSIS — J189 Pneumonia, unspecified organism: Secondary | ICD-10-CM | POA: Diagnosis not present

## 2023-04-02 ENCOUNTER — Encounter: Payer: Self-pay | Admitting: Cardiovascular Disease

## 2023-04-02 ENCOUNTER — Other Ambulatory Visit: Payer: Self-pay | Admitting: Hematology & Oncology

## 2023-04-02 ENCOUNTER — Encounter: Payer: Self-pay | Admitting: Hematology & Oncology

## 2023-04-02 DIAGNOSIS — R195 Other fecal abnormalities: Secondary | ICD-10-CM

## 2023-04-02 DIAGNOSIS — C20 Malignant neoplasm of rectum: Secondary | ICD-10-CM

## 2023-04-02 DIAGNOSIS — D500 Iron deficiency anemia secondary to blood loss (chronic): Secondary | ICD-10-CM

## 2023-04-03 ENCOUNTER — Other Ambulatory Visit: Payer: Self-pay | Admitting: Medical Oncology

## 2023-04-03 ENCOUNTER — Ambulatory Visit (HOSPITAL_BASED_OUTPATIENT_CLINIC_OR_DEPARTMENT_OTHER)
Admission: RE | Admit: 2023-04-03 | Discharge: 2023-04-03 | Disposition: A | Discharge status: Home / Self Care | Payer: Medicare PPO | Source: Ambulatory Visit | Attending: Hematology & Oncology | Admitting: Hematology & Oncology

## 2023-04-03 ENCOUNTER — Inpatient Hospital Stay: Payer: Medicare PPO

## 2023-04-03 ENCOUNTER — Encounter: Payer: Self-pay | Admitting: Hematology & Oncology

## 2023-04-03 ENCOUNTER — Inpatient Hospital Stay (HOSPITAL_BASED_OUTPATIENT_CLINIC_OR_DEPARTMENT_OTHER): Payer: Medicare PPO | Admitting: Hematology & Oncology

## 2023-04-03 ENCOUNTER — Ambulatory Visit: Payer: Medicare PPO | Admitting: Cardiovascular Disease

## 2023-04-03 ENCOUNTER — Encounter: Payer: Self-pay | Admitting: *Deleted

## 2023-04-03 VITALS — BP 129/49 | HR 78 | Temp 97.8°F | Resp 18 | Ht 60.0 in | Wt 150.0 lb

## 2023-04-03 DIAGNOSIS — D500 Iron deficiency anemia secondary to blood loss (chronic): Secondary | ICD-10-CM | POA: Insufficient documentation

## 2023-04-03 DIAGNOSIS — Z79899 Other long term (current) drug therapy: Secondary | ICD-10-CM | POA: Diagnosis not present

## 2023-04-03 DIAGNOSIS — C20 Malignant neoplasm of rectum: Secondary | ICD-10-CM | POA: Insufficient documentation

## 2023-04-03 DIAGNOSIS — J189 Pneumonia, unspecified organism: Secondary | ICD-10-CM | POA: Diagnosis not present

## 2023-04-03 DIAGNOSIS — R059 Cough, unspecified: Secondary | ICD-10-CM | POA: Diagnosis not present

## 2023-04-03 DIAGNOSIS — R0602 Shortness of breath: Secondary | ICD-10-CM | POA: Diagnosis not present

## 2023-04-03 DIAGNOSIS — R54 Age-related physical debility: Secondary | ICD-10-CM | POA: Diagnosis not present

## 2023-04-03 DIAGNOSIS — R918 Other nonspecific abnormal finding of lung field: Secondary | ICD-10-CM | POA: Diagnosis not present

## 2023-04-03 DIAGNOSIS — Z7901 Long term (current) use of anticoagulants: Secondary | ICD-10-CM | POA: Diagnosis not present

## 2023-04-03 DIAGNOSIS — I4891 Unspecified atrial fibrillation: Secondary | ICD-10-CM | POA: Diagnosis not present

## 2023-04-03 DIAGNOSIS — C7800 Secondary malignant neoplasm of unspecified lung: Secondary | ICD-10-CM | POA: Diagnosis not present

## 2023-04-03 DIAGNOSIS — Z79890 Hormone replacement therapy (postmenopausal): Secondary | ICD-10-CM | POA: Diagnosis not present

## 2023-04-03 LAB — CBC WITH DIFFERENTIAL (CANCER CENTER ONLY)
Abs Immature Granulocytes: 0.02 10*3/uL (ref 0.00–0.07)
Basophils Absolute: 0.1 10*3/uL (ref 0.0–0.1)
Basophils Relative: 1 %
Eosinophils Absolute: 0.1 10*3/uL (ref 0.0–0.5)
Eosinophils Relative: 1 %
HCT: 35.5 % — ABNORMAL LOW (ref 36.0–46.0)
Hemoglobin: 11.5 g/dL — ABNORMAL LOW (ref 12.0–15.0)
Immature Granulocytes: 0 %
Lymphocytes Relative: 18 %
Lymphs Abs: 1.2 10*3/uL (ref 0.7–4.0)
MCH: 31.2 pg (ref 26.0–34.0)
MCHC: 32.4 g/dL (ref 30.0–36.0)
MCV: 96.2 fL (ref 80.0–100.0)
Monocytes Absolute: 0.8 10*3/uL (ref 0.1–1.0)
Monocytes Relative: 11 %
Neutro Abs: 4.9 10*3/uL (ref 1.7–7.7)
Neutrophils Relative %: 69 %
Platelet Count: 262 10*3/uL (ref 150–400)
RBC: 3.69 MIL/uL — ABNORMAL LOW (ref 3.87–5.11)
RDW: 18.4 % — ABNORMAL HIGH (ref 11.5–15.5)
WBC Count: 7 10*3/uL (ref 4.0–10.5)
nRBC: 0 % (ref 0.0–0.2)

## 2023-04-03 LAB — CMP (CANCER CENTER ONLY)
ALT: 50 U/L — ABNORMAL HIGH (ref 0–44)
AST: 34 U/L (ref 15–41)
Albumin: 3.4 g/dL — ABNORMAL LOW (ref 3.5–5.0)
Alkaline Phosphatase: 77 U/L (ref 38–126)
Anion gap: 5 (ref 5–15)
BUN: 10 mg/dL (ref 8–23)
CO2: 34 mmol/L — ABNORMAL HIGH (ref 22–32)
Calcium: 9.1 mg/dL (ref 8.9–10.3)
Chloride: 93 mmol/L — ABNORMAL LOW (ref 98–111)
Creatinine: 0.49 mg/dL (ref 0.44–1.00)
GFR, Estimated: >60 mL/min (ref >60)
Glucose, Bld: 176 mg/dL — ABNORMAL HIGH (ref 70–99)
Potassium: 3.8 mmol/L (ref 3.5–5.1)
Sodium: 132 mmol/L — ABNORMAL LOW (ref 135–145)
Total Bilirubin: 0.8 mg/dL (ref 0.3–1.2)
Total Protein: 6.1 g/dL — ABNORMAL LOW (ref 6.5–8.1)

## 2023-04-03 LAB — IRON AND IRON BINDING CAPACITY (CC-WL,HP ONLY)
Iron: 26 ug/dL — ABNORMAL LOW (ref 28–170)
Saturation Ratios: 9 % — ABNORMAL LOW (ref 10.4–31.8)
TIBC: 283 ug/dL (ref 250–450)
UIBC: 257 ug/dL (ref 148–442)

## 2023-04-03 LAB — LACTATE DEHYDROGENASE: LDH: 187 U/L (ref 98–192)

## 2023-04-03 LAB — TSH: TSH: 8.663 u[IU]/mL — ABNORMAL HIGH (ref 0.350–4.500)

## 2023-04-03 LAB — CEA (IN HOUSE-CHCC): CEA (CHCC-In House): 2.21 ng/mL (ref 0.00–5.00)

## 2023-04-03 LAB — SAMPLE TO BLOOD BANK

## 2023-04-03 LAB — FERRITIN: Ferritin: 458 ng/mL — ABNORMAL HIGH (ref 11–307)

## 2023-04-03 MED ORDER — CEFDINIR 300 MG PO CAPS: 600.0000 mg | ORAL_CAPSULE | Freq: Every day | ORAL | 0 refills | Status: DC

## 2023-04-03 MED ORDER — DEXTROSE 5 % IV SOLN
2.0000 g | Freq: Once | INTRAVENOUS | Status: AC
  Administered 2023-04-03: 2 g via INTRAVENOUS
  Filled 2023-04-03: qty 20

## 2023-04-03 MED ORDER — SODIUM CHLORIDE 0.9% FLUSH
10.0000 mL | Freq: Once | INTRAVENOUS | Status: AC
  Administered 2023-04-03: 10 mL via INTRAVENOUS

## 2023-04-03 MED ORDER — HEPARIN SOD (PORK) LOCK FLUSH 100 UNIT/ML IV SOLN
500.0000 [IU] | Freq: Once | INTRAVENOUS | Status: AC
  Administered 2023-04-03: 500 [IU] via INTRAVENOUS

## 2023-04-03 MED ORDER — SODIUM CHLORIDE 0.9 % IV SOLN: INTRAVENOUS | Status: AC

## 2023-04-03 NOTE — Progress Notes (Signed)
Patient still frail after her recent hospital admission. Her treatment with Rande Lawman will be held. She will receive symptom management today, and restart her Xeloda. She will return in one week for assessment.   Oncology Nurse Navigator Documentation     04/03/2023   10:45 AM  Oncology Nurse Navigator Flowsheets  Navigator Follow Up Date: 04/12/2023  Navigator Follow Up Reason: Follow-up Appointment;Chemotherapy  Navigator Location [facility]  Navigator Encounter Type Treatment;Appt/Treatment Plan Review  Patient Visit Type MedOnc  Treatment Phase Active Tx  Barriers/Navigation Needs Coordination of Care;Education  Interventions Psycho-Social Support  Acuity Level 2-Minimal Needs (1-2 Barriers Identified)  Support Groups/Services Friends and Family  Time Spent with Patient 15

## 2023-04-03 NOTE — Patient Instructions (Signed)
Dehydration, Adult Dehydration is a condition in which there is not enough water or other fluids in the body. This happens when a person loses more fluids than they take in. Important organs cannot work right without the right amount of fluids. Any loss of fluids from the body can cause dehydration. Dehydration can be mild, worse, or very bad. It should be treated right away to keep it from getting very bad. What are the causes? Conditions that cause loss of water in the body. They include: Watery poop (diarrhea). Vomiting. Sweating a lot. Fever. Infection. Peeing (urinating) a lot. Not drinking enough fluids. Certain medicines, such as medicines that take extra fluid out of the body (diuretics). Lack of safe drinking water. Not being able to get enough water and food. What increases the risk? Having a long-term (chronic) illness that has not been treated the right way, such as: Diabetes. Heart disease. Kidney disease. Being 65 years of age or older. Having a disability. Living in a place that is high above the ground or sea (high in altitude). The thinner, drier air causes more fluid loss. Doing exercises that put stress on your body for a long time. Being active when in hot places. What are the signs or symptoms? Symptoms of dehydration depend on how bad it is. Mild or worse dehydration Thirst. Dry lips or dry mouth. Feeling dizzy or light-headed. Muscle cramps. Passing little pee or dark pee. Pee may be the color of tea. Headache. Very bad dehydration Changes in skin. Skin may: Be cold to the touch (clammy). Be blotchy or pale. Not go back to normal right after you pinch it and let it go. Little or no tears, pee, or sweat. Fast breathing. Low blood pressure. Weak pulse. Pulse that is more than 100 beats a minute when you are sitting still. Other changes, such as: Feeling very thirsty. Eyes that look hollow (sunken). Cold hands and feet. Being confused. Being very  tired (lethargic) or having trouble waking from sleep. Losing weight. Loss of consciousness. How is this treated? Treatment for this condition depends on how bad your dehydration is. Treatment should start right away. Do not wait until your condition gets very bad. Very bad dehydration is an emergency. You will need to go to a hospital. Mild or worse dehydration can be treated at home. You may be asked to: Drink more fluids. Drink an oral rehydration solution (ORS). This drink gives you the right amount of fluids, salts, and minerals (electrolytes). Very bad dehydration can be treated: With fluids through an IV tube. By correcting low levels of electrolytes in the body. By treating the problem that caused your dehydration. Follow these instructions at home: Oral rehydration solution If told by your doctor, drink an ORS: Make an ORS. Use instructions on the package. Start by drinking small amounts, about  cup (120 mL) every 5-10 minutes. Slowly drink more until you have had the amount that your doctor said to have.  Eating and drinking  Drink enough clear fluid to keep your pee pale yellow. If you were told to drink an ORS, finish the ORS first. Then, start slowly drinking other clear fluids. Drink fluids such as: Water. Do not drink only water. Doing that can make the salt (sodium) level in your body get too low. Water from ice chips you suck on. Fruit juice that you have added water to (diluted). Low-calorie sports drinks. Eat foods that have the right amounts of salts and minerals, such as bananas, oranges, potatoes,   tomatoes, or spinach. Do not drink alcohol. Avoid drinks that have caffeine or sugar. These include:: High-calorie sports drinks. Fruit juice that you did not add water to. Soda. Coffee or energy drinks. Avoid foods that are greasy or have a lot of fat or sugar. General instructions Take over-the-counter and prescription medicines only as told by your doctor. Do  not take sodium tablets. Doing that can make the salt level in your body get too high. Return to your normal activities as told by your doctor. Ask your doctor what activities are safe for you. Keep all follow-up visits. Your doctor may check and change your treatment. Contact a doctor if: You have pain in your belly (abdomen) and the pain: Gets worse. Stays in one place. You have a rash. You have a stiff neck. You get angry or annoyed more easily than normal. You are more tired or have a harder time waking than normal. You feel weak or dizzy. You feel very thirsty. Get help right away if: You have any symptoms of very bad dehydration. You vomit every time you eat or drink. Your vomiting gets worse, does not go away, or you vomit blood or green stuff. You are getting treatment, but symptoms are getting worse. You have a fever. You have a very bad headache. You have: Diarrhea that gets worse or does not go away. Blood in your poop (stool). This may cause poop to look black and tarry. No pee in 6-8 hours. Only a small amount of pee in 6-8 hours, and the pee is very dark. You have trouble breathing. These symptoms may be an emergency. Get help right away. Call 911. Do not wait to see if the symptoms will go away. Do not drive yourself to the hospital. This information is not intended to replace advice given to you by your health care provider. Make sure you discuss any questions you have with your health care provider. Document Revised: 03/27/2022 Document Reviewed: 03/27/2022 Elsevier Patient Education  2024 Elsevier Inc.  

## 2023-04-03 NOTE — Progress Notes (Signed)
Hematology and Oncology Follow Up Visit  [name] [id number] [birth year] 89 y.o. 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 3 weeks-s/p cycle #1  -- start on 02/09/2023 Xeloda 1500 mg po BID (14/7) -- s/p cycle #3 on 02/13/2023     Interim History:  Ms. Kopke is back for follow-up as she was hospitalized.  She was hospitalized when she came in to her office a couple weeks ago because of marked anemia and melena.  She had upper and lower endoscopy.  She had no obvious source of bleeding.  She did have the rectal lesion.  However there is no blood from the rectal lesion.  On her scans, there was decrease in the size of the rectal mass.  She got transfused.  She was taken off her Eliquis.  She has not had any problems with the atrial fibrillation.  She did develop a pneumonia.  She is on oxygen.  I did do a chest x-ray on her today.  This still showed some patchy infiltrates.  Again this might be infectious.  Also might be from the amiodarone that she is taking.  In addition, she may also have issues with the pembrolizumab.  Unfortunately, she cannot take steroids because of the changes in her cognitive state.  I we will hold off on giving her pembrolizumab right now.  I really do not think we have to add any uncertainty to her situation.  She is 89 years old.  She is little bit on the frail side.  She is a little bit on the frail side.  She is eating a little better.  She does seem to be a little bit dehydrated today.  I will go ahead and give her some IV fluid.  I will give her some IV Rocephin.  I will give her some outpatient Omnicef.    Currently, I would have said that her performance status is probably ECOG 2.  Medications:  Current Outpatient Medications:    acetaminophen (TYLENOL) 325 MG tablet, Take 2 tablets (650 mg total) by mouth every 6 (six) hours as needed  for mild pain (or Fever >/= 101)., Disp: 30 tablet, Rfl: 0   ALPRAZolam (XANAX) 0.5 MG tablet, Take 0.5 mg by mouth 3 (three) times daily as needed for anxiety. For anxiety, Disp: , Rfl:    amiodarone (PACERONE) 200 MG tablet, Take 1 tablet (200 mg total) by mouth daily. DO NOT START THIS MEDICATION UNTIL PREVIOUS DOSE (400mg ) HAS BEEN COMPLETED (Patient taking differently: Take 200 mg by mouth daily.), Disp: 30 tablet, Rfl: 0   amLODipine (NORVASC) 5 MG tablet, Take 1.5 tablets (7.5 mg total) by mouth daily., Disp: 135 tablet, Rfl: 3   cholecalciferol (VITAMIN D3) 25 MCG (1000 UNIT) tablet, Take 1,000 Units by mouth daily., Disp: , Rfl:    cyanocobalamin 1000 MCG tablet, Take 1 tablet (1,000 mcg total) by mouth daily., Disp: 30 tablet, Rfl: 0   escitalopram (LEXAPRO) 5 MG tablet, Take 5 mg by mouth at bedtime., Disp: , Rfl:    famotidine (PEPCID) 20 MG tablet, Take 20 mg by mouth daily., Disp: , Rfl:    feeding supplement (ENSURE ENLIVE / ENSURE PLUS) LIQD, Take 237 mLs by mouth 3 (three) times daily with meals. (Patient taking differently: Take 237 mLs by mouth 3 (three) times daily as needed (nutrition).), Disp: 237 mL, Rfl: 12   fluticasone (FLONASE) 50 MCG/ACT nasal  spray, Place 2 sprays into both nostrils daily. (Patient taking differently: Place 2 sprays into both nostrils daily as needed for allergies.), Disp: 11.1 mL, Rfl: 0   HYDROcodone-acetaminophen (NORCO/VICODIN) 5-325 MG tablet, Take 1-2 tablets by mouth every 4 (four) hours as needed for moderate pain., Disp: 30 tablet, Rfl: 0   levothyroxine (SYNTHROID) 125 MCG tablet, Take 125 mcg by mouth daily before breakfast., Disp: , Rfl:    loratadine (CLARITIN) 10 MG tablet, Take 1 tablet (10 mg total) by mouth daily., Disp: 30 tablet, Rfl: 0   polyethylene glycol (MIRALAX / GLYCOLAX) 17 g packet, Take 17 g by mouth daily as needed for mild constipation. (Patient taking differently: Take 17 g by mouth at bedtime.), Disp: 14 each, Rfl: 0    potassium chloride SA (K-DUR,KLOR-CON) 20 MEQ tablet, Take 20 mEq by mouth daily., Disp: , Rfl:    prochlorperazine (COMPAZINE) 10 MG tablet, Take 1 tablet (10 mg total) by mouth every 6 (six) hours as needed for nausea or vomiting., Disp: 30 tablet, Rfl: 1   TRUE METRIX BLOOD GLUCOSE TEST test strip, SMARTSIG:Via Meter, Disp: , Rfl:    capecitabine (XELODA) 500 MG tablet, Take 3 tablets (1,500 mg total) by mouth 2 (two) times daily after a meal. Take within 30 minutes after meals. Take for 14 days on, 7 days off. Repeat every 21 days. (Patient not taking: Reported on 04/03/2023), Disp: 84 tablet, Rfl: 5   guaiFENesin-dextromethorphan (ROBITUSSIN DM) 100-10 MG/5ML syrup, Take 10 mLs by mouth every 4 (four) hours as needed for cough. (Patient not taking: Reported on 04/03/2023), Disp: 118 mL, Rfl: 0   lidocaine-prilocaine (EMLA) cream, Apply to affected area once (Patient not taking: Reported on 04/03/2023), Disp: 30 g, Rfl: 3   methocarbamol (ROBAXIN) 500 MG tablet, Take 1 tablet (500 mg total) by mouth every 8 (eight) hours as needed for muscle spasms (abd pain). (Patient not taking: Reported on 04/03/2023), Disp: 10 tablet, Rfl: 0  Current Facility-Administered Medications:    0.9 %  sodium chloride infusion, , Intravenous, Continuous, Percy Comp, Rose Phi, MD, Last Rate: 500 mL/hr at 04/03/23 1125, New Bag at 04/03/23 1125  Allergies:  Allergies  Allergen Reactions   Prednisone Other (See Comments)    Climbs the walls   Aggrenox [Aspirin-Dipyridamole Er]     Severe Bleeding and Stomach Pain    Past Medical History, Surgical history, Social history, and Family History were reviewed and updated.  Review of Systems: Review of Systems  Constitutional:  Positive for fatigue.  HENT:  Negative.    Eyes: Negative.   Respiratory: Negative.    Cardiovascular:  Positive for palpitations.  Gastrointestinal:  Positive for blood in stool and rectal pain.  Endocrine: Negative.   Genitourinary: Negative.     Musculoskeletal: Negative.   Skin: Negative.   Neurological:  Positive for light-headedness.  Hematological: Negative.   Psychiatric/Behavioral: Negative.      Physical Exam:  height is 5' (1.524 m) and weight is 150 lb 0.6 oz (68.1 kg). Her oral temperature is 97.8 F (36.6 C). Her blood pressure is 129/49 (abnormal) and her pulse is 78. Her respiration is 18 and oxygen saturation is 95%.   Wt Readings from Last 3 Encounters:  04/03/23 150 lb 0.6 oz (68.1 kg)  03/19/23 149 lb 9.6 oz (67.9 kg)  03/19/23 149 lb (67.6 kg)    Physical Exam Vitals reviewed.  HENT:     Head: Normocephalic and atraumatic.  Eyes:     Pupils: Pupils are equal,  round, and reactive to light.  Cardiovascular:     Rate and Rhythm: Normal rate and regular rhythm.     Heart sounds: Normal heart sounds.     Comments: Cardiac exam is regular rate and rhythm.  She has no murmurs, rubs or bruits. Pulmonary:     Effort: Pulmonary effort is normal.     Breath sounds: Normal breath sounds.     Comments: She has good air movement bilaterally.  She has some crackles bilaterally.  I hear no wheezes. Abdominal:     General: Bowel sounds are normal.     Palpations: Abdomen is soft.  Genitourinary:    Comments: Rectal exam showed very small external hemorrhoids.  There is no mass in the rectal vault.  Her stool is black and heme positive. Musculoskeletal:        General: No tenderness or deformity. Normal range of motion.     Cervical back: Normal range of motion.  Lymphadenopathy:     Cervical: No cervical adenopathy.  Skin:    General: Skin is warm and dry.     Findings: No erythema or rash.  Neurological:     Mental Status: She is alert and oriented to person, place, and time.  Psychiatric:        Behavior: Behavior normal.        Thought Content: Thought content normal.        Judgment: Judgment normal.     Lab Results  Component Value Date   WBC 7.0 04/03/2023   HGB 11.5 (L) 04/03/2023   HCT 35.5  (L) 04/03/2023   MCV 96.2 04/03/2023   PLT 262 04/03/2023     Chemistry      Component Value Date/Time   NA 132 (L) 04/03/2023 0920   K 3.8 04/03/2023 0920   CL 93 (L) 04/03/2023 0920   CO2 34 (H) 04/03/2023 0920   BUN 10 04/03/2023 0920   CREATININE 0.49 04/03/2023 0920      Component Value Date/Time   CALCIUM 9.1 04/03/2023 0920   ALKPHOS 77 04/03/2023 0920   AST 34 04/03/2023 0920   ALT 50 (H) 04/03/2023 0920   BILITOT 0.8 04/03/2023 0920      Impression and Plan: Ms. Ritsema is a very charming 89 year old white female.  She has at least a locally advanced adenocarcinoma of the rectum.  This pulmonary nodule is in the right upper lobe.  It measures 15 mm.  Again, I would not imagine that she would be symptomatic from this.  For right now, we will just have to see how she does over the next couple weeks.  Again, she has this pneumonia.  I will see how she does with the Rocephin and the Omnicef.  I think would be nice to give her steroids but she says she cannot take steroids.  I would like to have her come back in a week.  I think we will have  to follow her closely.    I have I told that we could get her back onto the Xeloda.  I do not see a problem with her on Xeloda right now.  I do not think this would be a problem with her lungs.  When we see her back, we will have to get a chest x-ray on her.  Josph Macho, MD 7/23/20241:09 PM

## 2023-04-03 NOTE — Patient Instructions (Signed)

## 2023-04-03 NOTE — Addendum Note (Signed)
Addended by: Arlan Organ R on: 04/03/2023 02:02 PM   Modules accepted: Orders

## 2023-04-04 ENCOUNTER — Encounter: Payer: Self-pay | Admitting: Hematology & Oncology

## 2023-04-04 DIAGNOSIS — C20 Malignant neoplasm of rectum: Secondary | ICD-10-CM | POA: Diagnosis not present

## 2023-04-04 DIAGNOSIS — D630 Anemia in neoplastic disease: Secondary | ICD-10-CM | POA: Diagnosis not present

## 2023-04-04 DIAGNOSIS — J9601 Acute respiratory failure with hypoxia: Secondary | ICD-10-CM | POA: Diagnosis not present

## 2023-04-04 DIAGNOSIS — N3000 Acute cystitis without hematuria: Secondary | ICD-10-CM | POA: Diagnosis not present

## 2023-04-04 DIAGNOSIS — J181 Lobar pneumonia, unspecified organism: Secondary | ICD-10-CM | POA: Diagnosis not present

## 2023-04-04 DIAGNOSIS — D62 Acute posthemorrhagic anemia: Secondary | ICD-10-CM | POA: Diagnosis not present

## 2023-04-04 DIAGNOSIS — E038 Other specified hypothyroidism: Secondary | ICD-10-CM

## 2023-04-04 DIAGNOSIS — K922 Gastrointestinal hemorrhage, unspecified: Secondary | ICD-10-CM | POA: Diagnosis not present

## 2023-04-04 DIAGNOSIS — J90 Pleural effusion, not elsewhere classified: Secondary | ICD-10-CM | POA: Diagnosis not present

## 2023-04-04 DIAGNOSIS — B9620 Unspecified Escherichia coli [E. coli] as the cause of diseases classified elsewhere: Secondary | ICD-10-CM | POA: Diagnosis not present

## 2023-04-04 LAB — T4: T4, Total: 14.2 ug/dL — ABNORMAL HIGH (ref 4.5–12.0)

## 2023-04-04 MED ORDER — LEVOTHYROXINE SODIUM 150 MCG PO TABS
150.0000 ug | ORAL_TABLET | Freq: Every day | ORAL | 2 refills | Status: DC
Start: 2023-04-04 — End: 2023-05-08

## 2023-04-05 ENCOUNTER — Encounter: Payer: Self-pay | Admitting: Cardiovascular Disease

## 2023-04-05 ENCOUNTER — Telehealth: Payer: Self-pay | Admitting: Physician Assistant

## 2023-04-05 MED ORDER — AMIODARONE HCL 200 MG PO TABS: 200.0000 mg | ORAL_TABLET | Freq: Every day | ORAL | 3 refills | Status: DC

## 2023-04-05 NOTE — Telephone Encounter (Signed)
Called patient's son, [name], as requested in MyChart message.  [name] states he wanted to review dosage instructions for amiodarone to be sure he heard clearly during previous conversation. Patient to be taking amiodarone 200mg  daily. Refill sent to pharmacy.  [name] reports it is taking patient a little bit longer than usual to recover from this hospital stay. He states she is slowly improving, he is just used to her bouncing back more quickly. Informed him with her blood loss and being in the hospital for as long as she was and (per [name]) lingering pneumonia it will take some time for patient to regain her strength.  He wanted to also make Dr. Elease Hashimoto aware patient had a coughing spell last night that concerned him. He was able to get coughing to subside with some Tussin DM. He does report CXR in hospital showed pneumonia.   Patient has appt with Dr. Elease Hashimoto 8/13.

## 2023-04-05 NOTE — Telephone Encounter (Signed)
Returned call to [name], PT from [facility]. Informed him patient should be taking amiodarone 200mg  daily as of 02/26/23.  [name] states bottle reads 400mg  x1 week and notes she has quite a few 400mg  tablets left. He states patient's son [name] manages her medications.  Spoke with [name], patient's son (OK per DPR). He states he has been breaking 400mg  tablets in half and patient has been taking 200mg  amiodarone daily.  Requested refills for 200mg  tablets to be sent to [facility]. Completed.

## 2023-04-05 NOTE — Telephone Encounter (Signed)
Pt c/o medication issue:  1. Name of Medication: amiodarone (PACERONE) 200 MG tablet   2. How are you currently taking this medication (dosage and times per day)?   3. Are you having a reaction (difficulty breathing--STAT)?   4. What is your medication issue? Rosanne Ashing from [facility] is calling to find out what dose this patient should be taking of this medication. He states that if he does not answer to leave a detailed vm.

## 2023-04-06 ENCOUNTER — Other Ambulatory Visit: Payer: Medicare PPO

## 2023-04-06 ENCOUNTER — Inpatient Hospital Stay: Payer: Medicare PPO

## 2023-04-06 ENCOUNTER — Ambulatory Visit: Payer: Medicare PPO | Admitting: Hematology & Oncology

## 2023-04-06 ENCOUNTER — Ambulatory Visit: Payer: Medicare PPO

## 2023-04-09 ENCOUNTER — Ambulatory Visit: Payer: Medicare PPO | Admitting: Nurse Practitioner

## 2023-04-11 ENCOUNTER — Encounter: Payer: Self-pay | Admitting: Hematology & Oncology

## 2023-04-11 DIAGNOSIS — J181 Lobar pneumonia, unspecified organism: Secondary | ICD-10-CM | POA: Diagnosis not present

## 2023-04-11 DIAGNOSIS — J90 Pleural effusion, not elsewhere classified: Secondary | ICD-10-CM | POA: Diagnosis not present

## 2023-04-11 DIAGNOSIS — D630 Anemia in neoplastic disease: Secondary | ICD-10-CM | POA: Diagnosis not present

## 2023-04-11 DIAGNOSIS — K922 Gastrointestinal hemorrhage, unspecified: Secondary | ICD-10-CM | POA: Diagnosis not present

## 2023-04-11 DIAGNOSIS — J9601 Acute respiratory failure with hypoxia: Secondary | ICD-10-CM | POA: Diagnosis not present

## 2023-04-11 DIAGNOSIS — N3000 Acute cystitis without hematuria: Secondary | ICD-10-CM | POA: Diagnosis not present

## 2023-04-11 DIAGNOSIS — D62 Acute posthemorrhagic anemia: Secondary | ICD-10-CM | POA: Diagnosis not present

## 2023-04-11 DIAGNOSIS — B9620 Unspecified Escherichia coli [E. coli] as the cause of diseases classified elsewhere: Secondary | ICD-10-CM | POA: Diagnosis not present

## 2023-04-11 DIAGNOSIS — C20 Malignant neoplasm of rectum: Secondary | ICD-10-CM

## 2023-04-11 MED ORDER — ONDANSETRON HCL 8 MG PO TABS
8.0000 mg | ORAL_TABLET | Freq: Three times a day (TID) | ORAL | 0 refills | Status: DC | PRN
Start: 2023-04-11 — End: 2023-09-20

## 2023-04-12 ENCOUNTER — Inpatient Hospital Stay: Payer: Medicare PPO

## 2023-04-12 ENCOUNTER — Encounter: Payer: Self-pay | Admitting: Hematology & Oncology

## 2023-04-12 ENCOUNTER — Inpatient Hospital Stay: Payer: Medicare PPO | Admitting: Hematology & Oncology

## 2023-04-12 ENCOUNTER — Other Ambulatory Visit: Payer: Self-pay

## 2023-04-12 ENCOUNTER — Inpatient Hospital Stay: Payer: Medicare PPO | Attending: Hematology & Oncology

## 2023-04-12 ENCOUNTER — Ambulatory Visit (HOSPITAL_BASED_OUTPATIENT_CLINIC_OR_DEPARTMENT_OTHER)
Admission: RE | Admit: 2023-04-12 | Discharge: 2023-04-12 | Disposition: A | Discharge status: Home / Self Care | Payer: Medicare PPO | Source: Ambulatory Visit | Attending: Hematology & Oncology | Admitting: Hematology & Oncology

## 2023-04-12 VITALS — BP 153/48 | HR 69 | Temp 97.7°F | Resp 18 | Ht 60.0 in | Wt 143.1 lb

## 2023-04-12 VITALS — BP 132/47 | HR 66

## 2023-04-12 DIAGNOSIS — R918 Other nonspecific abnormal finding of lung field: Secondary | ICD-10-CM | POA: Diagnosis not present

## 2023-04-12 DIAGNOSIS — Z79890 Hormone replacement therapy (postmenopausal): Secondary | ICD-10-CM | POA: Diagnosis not present

## 2023-04-12 DIAGNOSIS — J189 Pneumonia, unspecified organism: Secondary | ICD-10-CM

## 2023-04-12 DIAGNOSIS — Z7952 Long term (current) use of systemic steroids: Secondary | ICD-10-CM | POA: Diagnosis not present

## 2023-04-12 DIAGNOSIS — C20 Malignant neoplasm of rectum: Secondary | ICD-10-CM | POA: Diagnosis not present

## 2023-04-12 DIAGNOSIS — R911 Solitary pulmonary nodule: Secondary | ICD-10-CM | POA: Diagnosis not present

## 2023-04-12 DIAGNOSIS — D500 Iron deficiency anemia secondary to blood loss (chronic): Secondary | ICD-10-CM

## 2023-04-12 DIAGNOSIS — Z452 Encounter for adjustment and management of vascular access device: Secondary | ICD-10-CM | POA: Diagnosis not present

## 2023-04-12 DIAGNOSIS — Z79899 Other long term (current) drug therapy: Secondary | ICD-10-CM | POA: Insufficient documentation

## 2023-04-12 LAB — CBC WITH DIFFERENTIAL (CANCER CENTER ONLY)
Abs Immature Granulocytes: 0.04 10*3/uL (ref 0.00–0.07)
Basophils Absolute: 0.1 10*3/uL (ref 0.0–0.1)
Basophils Relative: 1 %
Eosinophils Absolute: 0.2 10*3/uL (ref 0.0–0.5)
Eosinophils Relative: 2 %
HCT: 36.6 % (ref 36.0–46.0)
Hemoglobin: 12.1 g/dL (ref 12.0–15.0)
Immature Granulocytes: 1 %
Lymphocytes Relative: 19 %
Lymphs Abs: 1.5 10*3/uL (ref 0.7–4.0)
MCH: 30.9 pg (ref 26.0–34.0)
MCHC: 33.1 g/dL (ref 30.0–36.0)
MCV: 93.6 fL (ref 80.0–100.0)
Monocytes Absolute: 0.9 10*3/uL (ref 0.1–1.0)
Monocytes Relative: 11 %
Neutro Abs: 5.3 10*3/uL (ref 1.7–7.7)
Neutrophils Relative %: 66 %
Platelet Count: 391 10*3/uL (ref 150–400)
RBC: 3.91 MIL/uL (ref 3.87–5.11)
RDW: 17.1 % — ABNORMAL HIGH (ref 11.5–15.5)
WBC Count: 8 10*3/uL (ref 4.0–10.5)
nRBC: 0 % (ref 0.0–0.2)

## 2023-04-12 LAB — IRON AND IRON BINDING CAPACITY (CC-WL,HP ONLY)
Iron: 30 ug/dL (ref 28–170)
Saturation Ratios: 11 % (ref 10.4–31.8)
TIBC: 280 ug/dL (ref 250–450)
UIBC: 250 ug/dL (ref 148–442)

## 2023-04-12 LAB — CMP (CANCER CENTER ONLY)
ALT: 57 U/L — ABNORMAL HIGH (ref 0–44)
AST: 37 U/L (ref 15–41)
Albumin: 3.5 g/dL (ref 3.5–5.0)
Alkaline Phosphatase: 89 U/L (ref 38–126)
Anion gap: 7 (ref 5–15)
BUN: 12 mg/dL (ref 8–23)
CO2: 32 mmol/L (ref 22–32)
Calcium: 9.1 mg/dL (ref 8.9–10.3)
Chloride: 92 mmol/L — ABNORMAL LOW (ref 98–111)
Creatinine: 0.65 mg/dL (ref 0.44–1.00)
GFR, Estimated: >60 mL/min (ref >60)
Glucose, Bld: 140 mg/dL — ABNORMAL HIGH (ref 70–99)
Potassium: 4 mmol/L (ref 3.5–5.1)
Sodium: 131 mmol/L — ABNORMAL LOW (ref 135–145)
Total Bilirubin: 0.6 mg/dL (ref 0.3–1.2)
Total Protein: 6.8 g/dL (ref 6.5–8.1)

## 2023-04-12 LAB — SAVE SMEAR(SSMR), FOR PROVIDER SLIDE REVIEW

## 2023-04-12 LAB — PREALBUMIN: Prealbumin: 14 mg/dL — ABNORMAL LOW (ref 18–38)

## 2023-04-12 LAB — FERRITIN: Ferritin: 377 ng/mL — ABNORMAL HIGH (ref 11–307)

## 2023-04-12 MED ORDER — SODIUM CHLORIDE 0.9 % IV SOLN
510.0000 mg | Freq: Once | INTRAVENOUS | Status: AC
  Administered 2023-04-12: 510 mg via INTRAVENOUS
  Filled 2023-04-12: qty 17

## 2023-04-12 MED ORDER — PREDNISONE 20 MG PO TABS: 20.0000 mg | ORAL_TABLET | Freq: Every day | ORAL | 2 refills | Status: DC

## 2023-04-12 MED ORDER — HEPARIN SOD (PORK) LOCK FLUSH 100 UNIT/ML IV SOLN
500.0000 [IU] | Freq: Once | INTRAVENOUS | Status: AC | PRN
  Administered 2023-04-12: 500 [IU]

## 2023-04-12 MED ORDER — SODIUM CHLORIDE 0.9% FLUSH
10.0000 mL | Freq: Once | INTRAVENOUS | Status: AC | PRN
  Administered 2023-04-12: 10 mL

## 2023-04-12 MED ORDER — SODIUM CHLORIDE 0.9 % IV SOLN: Freq: Once | INTRAVENOUS | Status: AC

## 2023-04-12 MED ORDER — METHYLPREDNISOLONE SODIUM SUCC 125 MG IJ SOLR
80.0000 mg | Freq: Once | INTRAMUSCULAR | Status: AC
  Administered 2023-04-12: 80 mg via INTRAVENOUS

## 2023-04-12 NOTE — Patient Instructions (Signed)
Ferumoxytol Injection What is this medication? FERUMOXYTOL (FER ue MOX i tol) treats low levels of iron in your body (iron deficiency anemia). Iron is a mineral that plays an important role in making red blood cells, which carry oxygen from your lungs to the rest of your body. This medicine may be used for other purposes; ask your health care provider or pharmacist if you have questions. COMMON BRAND NAME(S): Feraheme What should I tell my care team before I take this medication? They need to know if you have any of these conditions: Anemia not caused by low iron levels High levels of iron in the blood Magnetic resonance imaging (MRI) test scheduled An unusual or allergic reaction to iron, other medications, foods, dyes, or preservatives Pregnant or trying to get pregnant Breastfeeding How should I use this medication? This medication is injected into a vein. It is given by your care team in a hospital or clinic setting. Talk to your care team the use of this medication in children. Special care may be needed. Overdosage: If you think you have taken too much of this medicine contact a poison control center or emergency room at once. NOTE: This medicine is only for you. Do not share this medicine with others. What if I miss a dose? It is important not to miss your dose. Call your care team if you are unable to keep an appointment. What may interact with this medication? Other iron products This list may not describe all possible interactions. Give your health care provider a list of all the medicines, herbs, non-prescription drugs, or dietary supplements you use. Also tell them if you smoke, drink alcohol, or use illegal drugs. Some items may interact with your medicine. What should I watch for while using this medication? Visit your care team regularly. Tell your care team if your symptoms do not start to get better or if they get worse. You may need blood work done while you are taking this  medication. You may need to follow a special diet. Talk to your care team. Foods that contain iron include: whole grains/cereals, dried fruits, beans, or peas, leafy green vegetables, and organ meats (liver, kidney). What side effects may I notice from receiving this medication? Side effects that you should report to your care team as soon as possible: Allergic reactions--skin rash, itching, hives, swelling of the face, lips, tongue, or throat Low blood pressure--dizziness, feeling faint or lightheaded, blurry vision Shortness of breath Side effects that usually do not require medical attention (report to your care team if they continue or are bothersome): Flushing Headache Joint pain Muscle pain Nausea Pain, redness, or irritation at injection site This list may not describe all possible side effects. Call your doctor for medical advice about side effects. You may report side effects to FDA at 1-800-FDA-1088. Where should I keep my medication? This medication is given in a hospital or clinic. It will not be stored at home. NOTE: This sheet is a summary. It may not cover all possible information. If you have questions about this medicine, talk to your doctor, pharmacist, or health care provider.  2024 Elsevier/Gold Standard (2023-02-02 00:00:00)

## 2023-04-12 NOTE — Patient Instructions (Signed)

## 2023-04-12 NOTE — Progress Notes (Signed)
Hematology and Oncology Follow Up Visit  [name] [id number] 05/15/1929 89 y.o. 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 3 weeks-s/p cycle #1  -- start on 02/09/2023 -- d/c on 04/12/2023 Xeloda 1500 mg po BID (14/7) -- s/p cycle #4 on 02/13/2023     Interim History:  [name] is back for follow-up.  We are still having problems with her breathing.  She did have a chest x-ray done today.  Still shows that she has this multifocal type of pneumonia.  I am unsure as to what the etiology of this might be.  However, I think that regard had to stop the pembrolizumab.  I just wonder if this pneumonia might not be some type of interstitial pneumonitis from the pembrolizumab.  I think we will have to try to get her on prednisone.  Apparently that she has had some reaction in the past.  We did give her dose of Solu-Medrol (80 mg IV) in the office.  She had no problems with this.  She still feels little tired.  Her hemoglobin is a lot better.  I do not think she is bleeding any longer.  She is now off Eliquis.    Of note, she is on amiodarone.  I do not know if the amiodarone might be a factor with her breathing.  I am sure that cardiology will help manage this.  Her last CEA level was 2.21.  She has had no fever.  She has had no productive cough.  There is been no leg swelling.  She says that she is not having as much pain when she sits down any longer.  I have to believe that this is from the tumor shrinking.  I think that the last colonoscopy that she had when she was hospitalized with her GI bleeding show that she was responding.  At the present time, I would have to say that her performance status is ECOG 2.   Medications:  Current Outpatient Medications:    acetaminophen (TYLENOL) 325 MG tablet, Take 2 tablets (650 mg total) by mouth every 6 (six) hours as needed for  mild pain (or Fever >/= 101)., Disp: 30 tablet, Rfl: 0   ALPRAZolam (XANAX) 0.5 MG tablet, Take 0.5 mg by mouth 3 (three) times daily as needed for anxiety. For anxiety, Disp: , Rfl:    amiodarone (PACERONE) 200 MG tablet, Take 1 tablet (200 mg total) by mouth daily., Disp: 90 tablet, Rfl: 3   amLODipine (NORVASC) 5 MG tablet, Take 1.5 tablets (7.5 mg total) by mouth daily., Disp: 135 tablet, Rfl: 3   cefdinir (OMNICEF) 300 MG capsule, Take 2 capsules (600 mg total) by mouth daily., Disp: 20 capsule, Rfl: 0   cholecalciferol (VITAMIN D3) 25 MCG (1000 UNIT) tablet, Take 1,000 Units by mouth daily., Disp: , Rfl:    cyanocobalamin 1000 MCG tablet, Take 1 tablet (1,000 mcg total) by mouth daily., Disp: 30 tablet, Rfl: 0   escitalopram (LEXAPRO) 5 MG tablet, Take 5 mg by mouth at bedtime., Disp: , Rfl:    famotidine (PEPCID) 20 MG tablet, Take 20 mg by mouth daily., Disp: , Rfl:    feeding supplement (ENSURE ENLIVE / ENSURE PLUS) LIQD, Take 237 mLs by mouth 3 (three) times daily with meals. (Patient taking differently: Take 237 mLs by mouth 3 (three) times daily as needed (nutrition).), Disp: 237 mL, Rfl: 12   fluticasone (  FLONASE) 50 MCG/ACT nasal spray, Place 2 sprays into both nostrils daily. (Patient taking differently: Place 2 sprays into both nostrils daily as needed for allergies.), Disp: 11.1 mL, Rfl: 0   HYDROcodone-acetaminophen (NORCO/VICODIN) 5-325 MG tablet, Take 1-2 tablets by mouth every 4 (four) hours as needed for moderate pain., Disp: 30 tablet, Rfl: 0   levothyroxine (SYNTHROID) 150 MCG tablet, Take 1 tablet (150 mcg total) by mouth daily before breakfast., Disp: 30 tablet, Rfl: 2   loratadine (CLARITIN) 10 MG tablet, Take 1 tablet (10 mg total) by mouth daily., Disp: 30 tablet, Rfl: 0   ondansetron (ZOFRAN) 8 MG tablet, Take 1 tablet (8 mg total) by mouth every 8 (eight) hours as needed for nausea or vomiting., Disp: 20 tablet, Rfl: 0   polyethylene glycol (MIRALAX / GLYCOLAX) 17 g  packet, Take 17 g by mouth daily as needed for mild constipation. (Patient taking differently: Take 17 g by mouth at bedtime.), Disp: 14 each, Rfl: 0   potassium chloride SA (K-DUR,KLOR-CON) 20 MEQ tablet, Take 20 mEq by mouth daily., Disp: , Rfl:    TRUE METRIX BLOOD GLUCOSE TEST test strip, SMARTSIG:Via Meter, Disp: , Rfl:    capecitabine (XELODA) 500 MG tablet, Take 3 tablets (1,500 mg total) by mouth 2 (two) times daily after a meal. Take within 30 minutes after meals. Take for 14 days on, 7 days off. Repeat every 21 days. (Patient not taking: Reported on 04/03/2023), Disp: 84 tablet, Rfl: 5   guaiFENesin-dextromethorphan (ROBITUSSIN DM) 100-10 MG/5ML syrup, Take 10 mLs by mouth every 4 (four) hours as needed for cough. (Patient not taking: Reported on 04/03/2023), Disp: 118 mL, Rfl: 0   methocarbamol (ROBAXIN) 500 MG tablet, Take 1 tablet (500 mg total) by mouth every 8 (eight) hours as needed for muscle spasms (abd pain). (Patient not taking: Reported on 04/03/2023), Disp: 10 tablet, Rfl: 0  Allergies:  Allergies  Allergen Reactions   Aggrenox [Aspirin-Dipyridamole Er] Other (See Comments)    Severe Bleeding and Stomach Pain   Prednisone Other (See Comments)    Climbs the walls    Past Medical History, Surgical history, Social history, and Family History were reviewed and updated.  Review of Systems: Review of Systems  Constitutional:  Positive for fatigue.  HENT:  Negative.    Eyes: Negative.   Respiratory: Negative.    Cardiovascular:  Positive for palpitations.  Gastrointestinal:  Positive for blood in stool and rectal pain.  Endocrine: Negative.   Genitourinary: Negative.    Musculoskeletal: Negative.   Skin: Negative.   Neurological:  Positive for light-headedness.  Hematological: Negative.   Psychiatric/Behavioral: Negative.      Physical Exam:  height is 5' (1.524 m) and weight is 143 lb 1.9 oz (64.9 kg). Her oral temperature is 97.7 F (36.5 C). Her blood pressure is  153/48 (abnormal) and her pulse is 69. Her respiration is 18 and oxygen saturation is 98%.   Wt Readings from Last 3 Encounters:  04/12/23 143 lb 1.9 oz (64.9 kg)  04/03/23 150 lb 0.6 oz (68.1 kg)  03/19/23 149 lb 9.6 oz (67.9 kg)    Physical Exam Vitals reviewed.  HENT:     Head: Normocephalic and atraumatic.  Eyes:     Pupils: Pupils are equal, round, and reactive to light.  Cardiovascular:     Rate and Rhythm: Normal rate and regular rhythm.     Heart sounds: Normal heart sounds.     Comments: Cardiac exam is regular rate and rhythm.  She has no murmurs, rubs or bruits. Pulmonary:     Effort: Pulmonary effort is normal.     Breath sounds: Normal breath sounds.     Comments: She has good air movement bilaterally.  She has some crackles bilaterally.  I hear no wheezes. Abdominal:     General: Bowel sounds are normal.     Palpations: Abdomen is soft.  Genitourinary:    Comments: Rectal exam showed very small external hemorrhoids.  There is no mass in the rectal vault.  Her stool is black and heme positive. Musculoskeletal:        General: No tenderness or deformity. Normal range of motion.     Cervical back: Normal range of motion.  Lymphadenopathy:     Cervical: No cervical adenopathy.  Skin:    General: Skin is warm and dry.     Findings: No erythema or rash.  Neurological:     Mental Status: She is alert and oriented to person, place, and time.  Psychiatric:        Behavior: Behavior normal.        Thought Content: Thought content normal.        Judgment: Judgment normal.     Lab Results  Component Value Date   WBC 8.0 04/12/2023   HGB 12.1 04/12/2023   HCT 36.6 04/12/2023   MCV 93.6 04/12/2023   PLT 391 04/12/2023     Chemistry      Component Value Date/Time   NA 131 (L) 04/12/2023 1004   K 4.0 04/12/2023 1004   CL 92 (L) 04/12/2023 1004   CO2 32 04/12/2023 1004   BUN 12 04/12/2023 1004   CREATININE 0.65 04/12/2023 1004      Component Value Date/Time    CALCIUM 9.1 04/12/2023 1004   ALKPHOS 89 04/12/2023 1004   AST 37 04/12/2023 1004   ALT 57 (H) 04/12/2023 1004   BILITOT 0.6 04/12/2023 1004      Impression and Plan: Ms. Quirke is a very charming 89 year old white female.  She has at least a locally advanced adenocarcinoma of the rectum.  There is a pulmonary nodule in the right upper lobe.  It measures 15 mm.  Again, I would not imagine that she would be symptomatic from this.  I just worry about this pneumonia that she has.  This is really an issue from my point of view.  Again, we will stop the pembrolizumab on her.  I will see about getting her on steroids.  Will try her on 20 mg of prednisone.  We will see how she does on this.  I would be a little bit worried to try to get her on a higher dose.  I will also try azithromycin or clarithromycin on her.  Maybe, that this is some atypical pneumonia.  We will have to get her back to see [place] in another week or so.  Of note she had an iron today.  She did well with the iron.   Josph Macho, MD 8/1/20244:49 PM

## 2023-04-13 ENCOUNTER — Encounter: Payer: Self-pay | Admitting: Hematology & Oncology

## 2023-04-13 ENCOUNTER — Other Ambulatory Visit: Payer: Self-pay

## 2023-04-13 ENCOUNTER — Other Ambulatory Visit (HOSPITAL_COMMUNITY): Payer: Self-pay

## 2023-04-13 MED ORDER — DOXYCYCLINE HYCLATE 100 MG PO TABS: 100.0000 mg | ORAL_TABLET | Freq: Two times a day (BID) | ORAL | 0 refills | Status: DC

## 2023-04-13 NOTE — Addendum Note (Signed)
Addended by: Arlan Organ R on: 04/13/2023 01:58 PM   Modules accepted: Orders

## 2023-04-15 DIAGNOSIS — J9601 Acute respiratory failure with hypoxia: Secondary | ICD-10-CM | POA: Diagnosis not present

## 2023-04-15 DIAGNOSIS — K922 Gastrointestinal hemorrhage, unspecified: Secondary | ICD-10-CM | POA: Diagnosis not present

## 2023-04-15 DIAGNOSIS — N3000 Acute cystitis without hematuria: Secondary | ICD-10-CM | POA: Diagnosis not present

## 2023-04-15 DIAGNOSIS — C20 Malignant neoplasm of rectum: Secondary | ICD-10-CM | POA: Diagnosis not present

## 2023-04-15 DIAGNOSIS — J181 Lobar pneumonia, unspecified organism: Secondary | ICD-10-CM | POA: Diagnosis not present

## 2023-04-15 DIAGNOSIS — D630 Anemia in neoplastic disease: Secondary | ICD-10-CM | POA: Diagnosis not present

## 2023-04-15 DIAGNOSIS — D62 Acute posthemorrhagic anemia: Secondary | ICD-10-CM | POA: Diagnosis not present

## 2023-04-15 DIAGNOSIS — B9620 Unspecified Escherichia coli [E. coli] as the cause of diseases classified elsewhere: Secondary | ICD-10-CM | POA: Diagnosis not present

## 2023-04-16 ENCOUNTER — Encounter: Payer: Self-pay | Admitting: *Deleted

## 2023-04-16 ENCOUNTER — Other Ambulatory Visit (HOSPITAL_COMMUNITY): Payer: Self-pay

## 2023-04-16 DIAGNOSIS — D630 Anemia in neoplastic disease: Secondary | ICD-10-CM | POA: Diagnosis not present

## 2023-04-16 DIAGNOSIS — D62 Acute posthemorrhagic anemia: Secondary | ICD-10-CM | POA: Diagnosis not present

## 2023-04-16 DIAGNOSIS — B9620 Unspecified Escherichia coli [E. coli] as the cause of diseases classified elsewhere: Secondary | ICD-10-CM | POA: Diagnosis not present

## 2023-04-16 DIAGNOSIS — C20 Malignant neoplasm of rectum: Secondary | ICD-10-CM | POA: Diagnosis not present

## 2023-04-16 DIAGNOSIS — N3000 Acute cystitis without hematuria: Secondary | ICD-10-CM | POA: Diagnosis not present

## 2023-04-16 DIAGNOSIS — K922 Gastrointestinal hemorrhage, unspecified: Secondary | ICD-10-CM | POA: Diagnosis not present

## 2023-04-16 DIAGNOSIS — J9601 Acute respiratory failure with hypoxia: Secondary | ICD-10-CM | POA: Diagnosis not present

## 2023-04-16 DIAGNOSIS — J90 Pleural effusion, not elsewhere classified: Secondary | ICD-10-CM | POA: Diagnosis not present

## 2023-04-16 DIAGNOSIS — J181 Lobar pneumonia, unspecified organism: Secondary | ICD-10-CM | POA: Diagnosis not present

## 2023-04-16 NOTE — Progress Notes (Signed)
Patient continues to have respiratory issues with changes on CXR. Treatment will be discontinued. Antibiotic and steroids prescribed. Will follow up later this week.   Oncology Nurse Navigator Documentation     04/16/2023   10:00 AM  Oncology Nurse Navigator Flowsheets  Navigator Follow Up Date: 04/20/2023  Navigator Follow Up Reason: Follow-up Appointment  Navigator Location [facility]  Navigator Encounter Type Appt/Treatment Plan Review  Patient Visit Type MedOnc  Treatment Phase Active Tx  Barriers/Navigation Needs Coordination of Care;Education  Interventions None Required  Acuity Level 2-Minimal Needs (1-2 Barriers Identified)  Support Groups/Services Friends and Family  Time Spent with Patient 15

## 2023-04-17 ENCOUNTER — Encounter: Payer: Self-pay | Admitting: Hematology & Oncology

## 2023-04-17 DIAGNOSIS — D630 Anemia in neoplastic disease: Secondary | ICD-10-CM | POA: Diagnosis not present

## 2023-04-17 DIAGNOSIS — C20 Malignant neoplasm of rectum: Secondary | ICD-10-CM | POA: Diagnosis not present

## 2023-04-17 DIAGNOSIS — J9601 Acute respiratory failure with hypoxia: Secondary | ICD-10-CM | POA: Diagnosis not present

## 2023-04-17 DIAGNOSIS — J90 Pleural effusion, not elsewhere classified: Secondary | ICD-10-CM | POA: Diagnosis not present

## 2023-04-17 DIAGNOSIS — N3000 Acute cystitis without hematuria: Secondary | ICD-10-CM | POA: Diagnosis not present

## 2023-04-17 DIAGNOSIS — K922 Gastrointestinal hemorrhage, unspecified: Secondary | ICD-10-CM | POA: Diagnosis not present

## 2023-04-17 DIAGNOSIS — B9620 Unspecified Escherichia coli [E. coli] as the cause of diseases classified elsewhere: Secondary | ICD-10-CM | POA: Diagnosis not present

## 2023-04-17 DIAGNOSIS — D62 Acute posthemorrhagic anemia: Secondary | ICD-10-CM | POA: Diagnosis not present

## 2023-04-17 DIAGNOSIS — J181 Lobar pneumonia, unspecified organism: Secondary | ICD-10-CM | POA: Diagnosis not present

## 2023-04-20 ENCOUNTER — Ambulatory Visit (HOSPITAL_BASED_OUTPATIENT_CLINIC_OR_DEPARTMENT_OTHER)
Admission: RE | Admit: 2023-04-20 | Discharge: 2023-04-20 | Disposition: A | Discharge status: Home / Self Care | Payer: Medicare PPO | Source: Ambulatory Visit | Attending: Hematology & Oncology | Admitting: Hematology & Oncology

## 2023-04-20 ENCOUNTER — Inpatient Hospital Stay: Payer: Medicare PPO

## 2023-04-20 ENCOUNTER — Ambulatory Visit: Payer: Medicare PPO | Admitting: Hematology & Oncology

## 2023-04-20 ENCOUNTER — Other Ambulatory Visit: Payer: Medicare PPO

## 2023-04-20 ENCOUNTER — Ambulatory Visit: Payer: Medicare PPO

## 2023-04-20 ENCOUNTER — Encounter: Payer: Self-pay | Admitting: *Deleted

## 2023-04-20 ENCOUNTER — Inpatient Hospital Stay (HOSPITAL_BASED_OUTPATIENT_CLINIC_OR_DEPARTMENT_OTHER): Payer: Medicare PPO | Admitting: Hematology & Oncology

## 2023-04-20 ENCOUNTER — Encounter: Payer: Self-pay | Admitting: Hematology & Oncology

## 2023-04-20 VITALS — BP 152/58 | HR 73 | Temp 98.1°F | Resp 20 | Ht 60.0 in | Wt 145.0 lb

## 2023-04-20 DIAGNOSIS — J189 Pneumonia, unspecified organism: Secondary | ICD-10-CM | POA: Diagnosis not present

## 2023-04-20 DIAGNOSIS — J849 Interstitial pulmonary disease, unspecified: Secondary | ICD-10-CM | POA: Diagnosis not present

## 2023-04-20 DIAGNOSIS — Z79890 Hormone replacement therapy (postmenopausal): Secondary | ICD-10-CM | POA: Diagnosis not present

## 2023-04-20 DIAGNOSIS — C20 Malignant neoplasm of rectum: Secondary | ICD-10-CM

## 2023-04-20 DIAGNOSIS — R918 Other nonspecific abnormal finding of lung field: Secondary | ICD-10-CM | POA: Diagnosis not present

## 2023-04-20 DIAGNOSIS — J984 Other disorders of lung: Secondary | ICD-10-CM | POA: Diagnosis not present

## 2023-04-20 DIAGNOSIS — R0782 Intercostal pain: Secondary | ICD-10-CM | POA: Diagnosis not present

## 2023-04-20 DIAGNOSIS — Z79899 Other long term (current) drug therapy: Secondary | ICD-10-CM | POA: Diagnosis not present

## 2023-04-20 DIAGNOSIS — C189 Malignant neoplasm of colon, unspecified: Secondary | ICD-10-CM | POA: Diagnosis not present

## 2023-04-20 DIAGNOSIS — D500 Iron deficiency anemia secondary to blood loss (chronic): Secondary | ICD-10-CM | POA: Diagnosis not present

## 2023-04-20 DIAGNOSIS — Z95828 Presence of other vascular implants and grafts: Secondary | ICD-10-CM

## 2023-04-20 DIAGNOSIS — R911 Solitary pulmonary nodule: Secondary | ICD-10-CM | POA: Diagnosis not present

## 2023-04-20 DIAGNOSIS — Z7952 Long term (current) use of systemic steroids: Secondary | ICD-10-CM | POA: Diagnosis not present

## 2023-04-20 LAB — CMP (CANCER CENTER ONLY)
ALT: 29 U/L (ref 0–44)
AST: 17 U/L (ref 15–41)
Albumin: 3.6 g/dL (ref 3.5–5.0)
Alkaline Phosphatase: 87 U/L (ref 38–126)
Anion gap: 8 (ref 5–15)
BUN: 20 mg/dL (ref 8–23)
CO2: 30 mmol/L (ref 22–32)
Calcium: 9.2 mg/dL (ref 8.9–10.3)
Chloride: 92 mmol/L — ABNORMAL LOW (ref 98–111)
Creatinine: 0.69 mg/dL (ref 0.44–1.00)
GFR, Estimated: >60 mL/min (ref >60)
Glucose, Bld: 135 mg/dL — ABNORMAL HIGH (ref 70–99)
Potassium: 3.6 mmol/L (ref 3.5–5.1)
Sodium: 130 mmol/L — ABNORMAL LOW (ref 135–145)
Total Bilirubin: 0.6 mg/dL (ref 0.3–1.2)
Total Protein: 6.4 g/dL — ABNORMAL LOW (ref 6.5–8.1)

## 2023-04-20 LAB — CBC WITH DIFFERENTIAL (CANCER CENTER ONLY)
Abs Immature Granulocytes: 0.21 10*3/uL — ABNORMAL HIGH (ref 0.00–0.07)
Basophils Absolute: 0.1 10*3/uL (ref 0.0–0.1)
Basophils Relative: 1 %
Eosinophils Absolute: 0.1 10*3/uL (ref 0.0–0.5)
Eosinophils Relative: 1 %
HCT: 37.7 % (ref 36.0–46.0)
Hemoglobin: 12.6 g/dL (ref 12.0–15.0)
Immature Granulocytes: 2 %
Lymphocytes Relative: 16 %
Lymphs Abs: 1.9 10*3/uL (ref 0.7–4.0)
MCH: 31 pg (ref 26.0–34.0)
MCHC: 33.4 g/dL (ref 30.0–36.0)
MCV: 92.9 fL (ref 80.0–100.0)
Monocytes Absolute: 0.7 10*3/uL (ref 0.1–1.0)
Monocytes Relative: 6 %
Neutro Abs: 9 10*3/uL — ABNORMAL HIGH (ref 1.7–7.7)
Neutrophils Relative %: 74 %
Platelet Count: 413 10*3/uL — ABNORMAL HIGH (ref 150–400)
RBC: 4.06 MIL/uL (ref 3.87–5.11)
RDW: 18 % — ABNORMAL HIGH (ref 11.5–15.5)
WBC Count: 12 10*3/uL — ABNORMAL HIGH (ref 4.0–10.5)
nRBC: 0 % (ref 0.0–0.2)

## 2023-04-20 LAB — IRON AND IRON BINDING CAPACITY (CC-WL,HP ONLY)
Iron: 102 ug/dL (ref 28–170)
Saturation Ratios: 37 % — ABNORMAL HIGH (ref 10.4–31.8)
TIBC: 276 ug/dL (ref 250–450)
UIBC: 174 ug/dL (ref 148–442)

## 2023-04-20 LAB — FERRITIN: Ferritin: 718 ng/mL — ABNORMAL HIGH (ref 11–307)

## 2023-04-20 MED ORDER — HEPARIN SOD (PORK) LOCK FLUSH 100 UNIT/ML IV SOLN
500.0000 [IU] | Freq: Once | INTRAVENOUS | Status: AC
  Administered 2023-04-20: 500 [IU] via INTRAVENOUS

## 2023-04-20 MED ORDER — LIDOCAINE-PRILOCAINE 2.5-2.5 % EX CREA: 1.0000 | TOPICAL_CREAM | CUTANEOUS | 4 refills | Status: DC | PRN

## 2023-04-20 MED ORDER — AMIODARONE HCL 100 MG PO TABS: 100.0000 mg | ORAL_TABLET | Freq: Every day | ORAL | Status: DC

## 2023-04-20 MED ORDER — SODIUM CHLORIDE 0.9% FLUSH
10.0000 mL | Freq: Once | INTRAVENOUS | Status: AC
  Administered 2023-04-20: 10 mL via INTRAVENOUS

## 2023-04-20 NOTE — Progress Notes (Signed)
Hematology and Oncology Follow Up Visit  [name] [id number] 1929-07-20 89 y.o. 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 3 weeks-s/p cycle #1  -- start on 02/09/2023 -- d/c on 04/12/2023 Xeloda 1500 mg po BID (14/7) -- s/p cycle #5 on 02/13/2023     Interim History:  [name] is back for follow-up.  She is not doing better.  I am happy for her.  Her breathing is better.  She has had not as much oxygen use.  She still uses oxygen at nighttime when she is sleeping.  We did do a chest x-ray on her today.  There was improvement in the right upper lobe.  She is tolerated oral prednisone.  She is on 20 mg a day.  She is also on doxycycline.  I will add to keep her on the doxycycline for another 10 days.  We will keep her on the prednisone at 20 mg a day.  I really think she can try to get off the amiodarone.  We will decrease her amiodarone down to 100 mg a day.  I think she sees cardiology next week.  She does not have any rectal pain.  There is been no melena or bright red blood per rectum.  I think that she is tolerating the Xeloda quite nicely.  She has had no diarrhea.  She has had no mouth sores.  There is been no pain in the hands or feet.  Her last CEA level was 2.2.  Overall, I would have said that her performance status is probably ECOG 1-2.    Medications:  Current Outpatient Medications:    acetaminophen (TYLENOL) 325 MG tablet, Take 2 tablets (650 mg total) by mouth every 6 (six) hours as needed for mild pain (or Fever >/= 101)., Disp: 30 tablet, Rfl: 0   ALPRAZolam (XANAX) 0.5 MG tablet, Take 0.5 mg by mouth 3 (three) times daily as needed for anxiety. For anxiety, Disp: , Rfl:    amLODipine (NORVASC) 5 MG tablet, Take 1.5 tablets (7.5 mg total) by mouth daily., Disp: 135 tablet, Rfl: 3   cholecalciferol (VITAMIN D3) 25 MCG (1000 UNIT) tablet, Take 1,000  Units by mouth daily., Disp: , Rfl:    cyanocobalamin 1000 MCG tablet, Take 1 tablet (1,000 mcg total) by mouth daily., Disp: 30 tablet, Rfl: 0   doxycycline (VIBRA-TABS) 100 MG tablet, Take 1 tablet (100 mg total) by mouth 2 (two) times daily., Disp: 20 tablet, Rfl: 0   escitalopram (LEXAPRO) 5 MG tablet, Take 5 mg by mouth at bedtime., Disp: , Rfl:    famotidine (PEPCID) 20 MG tablet, Take 20 mg by mouth daily., Disp: , Rfl:    feeding supplement (ENSURE ENLIVE / ENSURE PLUS) LIQD, Take 237 mLs by mouth 3 (three) times daily with meals. (Patient taking differently: Take 237 mLs by mouth 3 (three) times daily as needed (nutrition).), Disp: 237 mL, Rfl: 12   fluticasone (FLONASE) 50 MCG/ACT nasal spray, Place 2 sprays into both nostrils daily. (Patient taking differently: Place 2 sprays into both nostrils daily as needed for allergies.), Disp: 11.1 mL, Rfl: 0   levothyroxine (SYNTHROID) 150 MCG tablet, Take 1 tablet (150 mcg total) by mouth daily before breakfast., Disp: 30 tablet, Rfl: 2   loratadine (CLARITIN) 10 MG tablet, Take 1 tablet (10 mg total) by mouth daily., Disp: 30 tablet, Rfl: 0   ondansetron (ZOFRAN) 8 MG  tablet, Take 1 tablet (8 mg total) by mouth every 8 (eight) hours as needed for nausea or vomiting., Disp: 20 tablet, Rfl: 0   polyethylene glycol (MIRALAX / GLYCOLAX) 17 g packet, Take 17 g by mouth daily as needed for mild constipation. (Patient taking differently: Take 17 g by mouth at bedtime.), Disp: 14 each, Rfl: 0   potassium chloride SA (K-DUR,KLOR-CON) 20 MEQ tablet, Take 20 mEq by mouth daily., Disp: , Rfl:    predniSONE (DELTASONE) 20 MG tablet, Take 1 tablet (20 mg total) by mouth daily with breakfast., Disp: 30 tablet, Rfl: 2   TRUE METRIX BLOOD GLUCOSE TEST test strip, SMARTSIG:Via Meter, Disp: , Rfl:    amiodarone (PACERONE) 100 MG tablet, Take 1 tablet (100 mg total) by mouth daily., Disp: , Rfl:    capecitabine (XELODA) 500 MG tablet, Take 3 tablets (1,500 mg total)  by mouth 2 (two) times daily after a meal. Take within 30 minutes after meals. Take for 14 days on, 7 days off. Repeat every 21 days. (Patient not taking: Reported on 04/03/2023), Disp: 84 tablet, Rfl: 5   guaiFENesin-dextromethorphan (ROBITUSSIN DM) 100-10 MG/5ML syrup, Take 10 mLs by mouth every 4 (four) hours as needed for cough. (Patient not taking: Reported on 04/20/2023), Disp: 118 mL, Rfl: 0   HYDROcodone-acetaminophen (NORCO/VICODIN) 5-325 MG tablet, Take 1-2 tablets by mouth every 4 (four) hours as needed for moderate pain. (Patient not taking: Reported on 04/20/2023), Disp: 30 tablet, Rfl: 0   methocarbamol (ROBAXIN) 500 MG tablet, Take 1 tablet (500 mg total) by mouth every 8 (eight) hours as needed for muscle spasms (abd pain). (Patient not taking: Reported on 04/03/2023), Disp: 10 tablet, Rfl: 0  Allergies:  Allergies  Allergen Reactions   Aggrenox [Aspirin-Dipyridamole Er] Other (See Comments)    Severe Bleeding and Stomach Pain   Prednisone Other (See Comments)    Climbs the walls    Past Medical History, Surgical history, Social history, and Family History were reviewed and updated.  Review of Systems: Review of Systems  Constitutional:  Positive for fatigue.  HENT:  Negative.    Eyes: Negative.   Respiratory: Negative.    Cardiovascular:  Positive for palpitations.  Gastrointestinal:  Positive for blood in stool and rectal pain.  Endocrine: Negative.   Genitourinary: Negative.    Musculoskeletal: Negative.   Skin: Negative.   Neurological:  Positive for light-headedness.  Hematological: Negative.   Psychiatric/Behavioral: Negative.      Physical Exam:  height is 5' (1.524 m) and weight is 145 lb (65.8 kg). Her oral temperature is 98.1 F (36.7 C). Her blood pressure is 152/58 (abnormal) and her pulse is 73. Her respiration is 20 and oxygen saturation is 95%.   Wt Readings from Last 3 Encounters:  04/20/23 145 lb (65.8 kg)  04/12/23 143 lb 1.9 oz (64.9 kg)  04/03/23  150 lb 0.6 oz (68.1 kg)    Physical Exam Vitals reviewed.  HENT:     Head: Normocephalic and atraumatic.  Eyes:     Pupils: Pupils are equal, round, and reactive to light.  Cardiovascular:     Rate and Rhythm: Normal rate and regular rhythm.     Heart sounds: Normal heart sounds.     Comments: Cardiac exam is regular rate and rhythm.  She has no murmurs, rubs or bruits. Pulmonary:     Effort: Pulmonary effort is normal.     Breath sounds: Normal breath sounds.     Comments: She has good air movement  bilaterally.  She has some crackles bilaterally.  I hear no wheezes. Abdominal:     General: Bowel sounds are normal.     Palpations: Abdomen is soft.  Genitourinary:    Comments: Rectal exam showed very small external hemorrhoids.  There is no mass in the rectal vault.  Her stool is black and heme positive. Musculoskeletal:        General: No tenderness or deformity. Normal range of motion.     Cervical back: Normal range of motion.  Lymphadenopathy:     Cervical: No cervical adenopathy.  Skin:    General: Skin is warm and dry.     Findings: No erythema or rash.  Neurological:     Mental Status: She is alert and oriented to person, place, and time.  Psychiatric:        Behavior: Behavior normal.        Thought Content: Thought content normal.        Judgment: Judgment normal.     Lab Results  Component Value Date   WBC 12.0 (H) 04/20/2023   HGB 12.6 04/20/2023   HCT 37.7 04/20/2023   MCV 92.9 04/20/2023   PLT 413 (H) 04/20/2023     Chemistry      Component Value Date/Time   NA 130 (L) 04/20/2023 1105   K 3.6 04/20/2023 1105   CL 92 (L) 04/20/2023 1105   CO2 30 04/20/2023 1105   BUN 20 04/20/2023 1105   CREATININE 0.69 04/20/2023 1105      Component Value Date/Time   CALCIUM 9.2 04/20/2023 1105   ALKPHOS 87 04/20/2023 1105   AST 17 04/20/2023 1105   ALT 29 04/20/2023 1105   BILITOT 0.6 04/20/2023 1105      Impression and Plan: [name] is a very  charming 89 year old white female.  She has at least a locally advanced adenocarcinoma of the rectum.  There is a pulmonary nodule in the right upper lobe.  It measures 15 mm.  Again, I would not imagine that she would be symptomatic from this.  I am happy that she is improving with the pneumonia.  This certainly will make her quality of life a lot better.  I do not think we have to worry about doing any scans or any colonoscopies on her probably till October.  I still would like to treat her with the Xeloda since she is doing so well on Xeloda.  I would like to have her come back in another 3 weeks for follow-up.  Will get another chest x-ray on her.  If we see that the interstitial pneumonia is improving, then we will start to decrease her prednisone.  As always, her faith is incredibly strong.   Josph Macho, MD 8/9/20241:34 PM

## 2023-04-20 NOTE — Progress Notes (Signed)
BP remains elevated, instructed DIL to take at home and notify PCP if it remains over 140/90. Verbalized understanding.

## 2023-04-20 NOTE — Patient Instructions (Signed)

## 2023-04-20 NOTE — Progress Notes (Signed)
Patient is doing better and will continue on single agent Xeloda.   Oncology Nurse Navigator Documentation     04/20/2023   12:30 PM  Oncology Nurse Navigator Flowsheets  Navigator Follow Up Date: 05/08/2023  Navigator Follow Up Reason: Follow-up Appointment  Navigator Location [facility]  Navigator Encounter Type Appt/Treatment Plan Review  Patient Visit Type MedOnc  Treatment Phase Active Tx  Barriers/Navigation Needs Coordination of Care;Education  Interventions None Required  Acuity Level 2-Minimal Needs (1-2 Barriers Identified)  Support Groups/Services Friends and Family  Time Spent with Patient 15

## 2023-04-20 NOTE — Addendum Note (Signed)
Addended by: Cooper Render on: 04/20/2023 11:25 AM   Modules accepted: Orders

## 2023-04-23 ENCOUNTER — Encounter: Payer: Self-pay | Admitting: Hematology & Oncology

## 2023-04-23 ENCOUNTER — Encounter: Payer: Self-pay | Admitting: Cardiovascular Disease

## 2023-04-23 NOTE — Progress Notes (Unsigned)
  Cardiology Office Note:  .   Date:  04/24/2023  ID:  [name], DOB 11/29/1928, MRN [id number] PCP: Christina Corn, MD  [facility] Providers Cardiologist:     History of Present Illness: .   [name] is a 89 y.o. female with hx of anemia, colon cancer, prolonged sinus pauses  Has episodes of PAF. Has long post conversion pauses up to 10 seconds when she converts to sinus rhythm  Is not a candidate for anticoagulation   Echo :  normal LV systolic functio with EF 60-65% Mild MR  mild AS with mean AV gradient of 10 mm Hg   She has not had any recent rectal bleeding  I will discuss  ROS:   Studies Reviewed: .         Risk Assessment/Calculations:    CHA2DS2-VASc Score = 8   This indicates a 10.8% annual risk of stroke. The patient's score is based upon: CHF History: 0 HTN History: 1 Diabetes History: 1 Stroke History: 2 Vascular Disease History: 1 Age Score: 2 Gender Score: 1           Physical Exam:   VS:  BP 139/68   Pulse 69   Ht 5' (1.524 m)   Wt 146 lb 12.8 oz (66.6 kg)   SpO2 92%   BMI 28.67 kg/m    Wt Readings from Last 3 Encounters:  04/24/23 146 lb 12.8 oz (66.6 kg)  04/20/23 145 lb (65.8 kg)  04/12/23 143 lb 1.9 oz (64.9 kg)    GEN: elderly female, examined in her wheelchair ,  in no acute distress NECK: No JVD; No carotid bruits CARDIAC: RRR,, soft systolic murmur  RESPIRATORY:  Clear to auscultation without rales, wheezing or rhonchi  ABDOMEN: Soft, non-tender, non-distended EXTREMITIES:  trace leg edema ,  No deformity   ASSESSMENT AND PLAN: .     1.  Paroxysmal atrial fibrillation: [name] presented to the hospital admitted with rectal bleeding.  She was noted to have long pauses when she was converting from atrial fibrillation back in a sinus rhythm.  We started her on amiodarone in an effort to keep her in sinus rhythm which should hopefully eliminate these long pauses.  Dr. Myna Pineda recently reduced her dose of  amiodarone to 100 mg a day which seems to be working.  If at all possible I would like to continue the low-dose amiodarone in an effort to keep her in sinus rhythm.  This will hopefully minimize any sinus pauses.  I realize that she is having some interstitial lung disease.  The complications related to pulmonary fibrosis from amiodarone usually take many years to develop and I do not think that her current interstitial lung disease is related to amiodarone therapy.  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  I do not think that she is a candidate for systemic anticoagulation for this reason.  Will continue to follow.  I will discuss this with Dr. Drue Pineda  Her TSH was found to be mildly elevated when we started the amiodarone in May, it continues to be mildly elevated 8.6.  I will defer to Dr. Timothy Pineda as to whether or not she needs to start levothyroxine.   2.  Hypertension: Blood pressure overall is fairly well-controlled.       Dispo: 6 months    Signed, Christina Miss, MD

## 2023-04-24 ENCOUNTER — Encounter: Payer: Self-pay | Admitting: Cardiovascular Disease

## 2023-04-24 ENCOUNTER — Ambulatory Visit: Payer: Medicare PPO | Attending: Cardiovascular Disease | Admitting: Cardiovascular Disease

## 2023-04-24 ENCOUNTER — Other Ambulatory Visit: Payer: Self-pay

## 2023-04-24 VITALS — BP 139/68 | HR 69 | Ht 60.0 in | Wt 146.8 lb

## 2023-04-24 DIAGNOSIS — I480 Paroxysmal atrial fibrillation: Secondary | ICD-10-CM

## 2023-04-24 NOTE — Patient Instructions (Signed)
Medication Instructions:  Your physician recommends that you continue on your current medications as directed. Please refer to the Current Medication list given to you today.  *If you need a refill on your cardiac medications before your next appointment, please call your pharmacy*   Lab Work: **please go to lab for already placed orders**(CMET, TSH, Free T4) If you have labs (blood work) drawn today and your tests are completely normal, you will receive your results only by: MyChart Message (if you have MyChart) OR A paper copy in the mail If you have any lab test that is abnormal or we need to change your treatment, we will call you to review the results.   Testing/Procedures: NONE   Follow-Up: At [facility], you and your health needs are our priority.  As part of our continuing mission to provide you with exceptional heart care, we have created designated Provider Care Teams.  These Care Teams include your primary Cardiologist (physician) and Advanced Practice Providers (APPs -  Physician Assistants and Nurse Practitioners) who all work together to provide you with the care you need, when you need it.  We recommend signing up for the patient portal called "MyChart".  Sign up information is provided on this After Visit Summary.  MyChart is used to connect with patients for Virtual Visits (Telemedicine).  Patients are able to view lab/test results, encounter notes, upcoming appointments, etc.  Non-urgent messages can be sent to your provider as well.   To learn more about what you can do with MyChart, go to [email or web address].    Your next appointment:   6 month(s)  Provider:   Kristeen Miss, MD

## 2023-04-25 DIAGNOSIS — C20 Malignant neoplasm of rectum: Secondary | ICD-10-CM | POA: Diagnosis not present

## 2023-04-25 DIAGNOSIS — J9601 Acute respiratory failure with hypoxia: Secondary | ICD-10-CM | POA: Diagnosis not present

## 2023-04-25 DIAGNOSIS — K922 Gastrointestinal hemorrhage, unspecified: Secondary | ICD-10-CM | POA: Diagnosis not present

## 2023-04-25 DIAGNOSIS — D630 Anemia in neoplastic disease: Secondary | ICD-10-CM | POA: Diagnosis not present

## 2023-04-25 DIAGNOSIS — J90 Pleural effusion, not elsewhere classified: Secondary | ICD-10-CM | POA: Diagnosis not present

## 2023-04-25 DIAGNOSIS — D62 Acute posthemorrhagic anemia: Secondary | ICD-10-CM | POA: Diagnosis not present

## 2023-04-25 DIAGNOSIS — N3000 Acute cystitis without hematuria: Secondary | ICD-10-CM | POA: Diagnosis not present

## 2023-04-25 DIAGNOSIS — B9620 Unspecified Escherichia coli [E. coli] as the cause of diseases classified elsewhere: Secondary | ICD-10-CM | POA: Diagnosis not present

## 2023-04-25 DIAGNOSIS — J181 Lobar pneumonia, unspecified organism: Secondary | ICD-10-CM | POA: Diagnosis not present

## 2023-04-26 ENCOUNTER — Inpatient Hospital Stay: Payer: Medicare PPO

## 2023-04-26 ENCOUNTER — Inpatient Hospital Stay: Payer: Medicare PPO | Admitting: Hematology & Oncology

## 2023-04-27 DIAGNOSIS — C20 Malignant neoplasm of rectum: Secondary | ICD-10-CM | POA: Diagnosis not present

## 2023-04-27 DIAGNOSIS — D62 Acute posthemorrhagic anemia: Secondary | ICD-10-CM | POA: Diagnosis not present

## 2023-04-27 DIAGNOSIS — B9620 Unspecified Escherichia coli [E. coli] as the cause of diseases classified elsewhere: Secondary | ICD-10-CM | POA: Diagnosis not present

## 2023-04-27 DIAGNOSIS — J90 Pleural effusion, not elsewhere classified: Secondary | ICD-10-CM | POA: Diagnosis not present

## 2023-04-27 DIAGNOSIS — J9601 Acute respiratory failure with hypoxia: Secondary | ICD-10-CM | POA: Diagnosis not present

## 2023-04-27 DIAGNOSIS — J181 Lobar pneumonia, unspecified organism: Secondary | ICD-10-CM | POA: Diagnosis not present

## 2023-04-27 DIAGNOSIS — N3000 Acute cystitis without hematuria: Secondary | ICD-10-CM | POA: Diagnosis not present

## 2023-04-27 DIAGNOSIS — D630 Anemia in neoplastic disease: Secondary | ICD-10-CM | POA: Diagnosis not present

## 2023-04-27 DIAGNOSIS — K922 Gastrointestinal hemorrhage, unspecified: Secondary | ICD-10-CM | POA: Diagnosis not present

## 2023-04-30 DIAGNOSIS — J9601 Acute respiratory failure with hypoxia: Secondary | ICD-10-CM | POA: Diagnosis not present

## 2023-05-01 ENCOUNTER — Encounter: Payer: Self-pay | Admitting: Hematology & Oncology

## 2023-05-01 ENCOUNTER — Encounter: Payer: Self-pay | Admitting: Cardiovascular Disease

## 2023-05-01 DIAGNOSIS — J189 Pneumonia, unspecified organism: Secondary | ICD-10-CM | POA: Diagnosis not present

## 2023-05-04 ENCOUNTER — Other Ambulatory Visit (HOSPITAL_COMMUNITY): Payer: Self-pay

## 2023-05-07 ENCOUNTER — Other Ambulatory Visit (HOSPITAL_COMMUNITY): Payer: Self-pay

## 2023-05-07 DIAGNOSIS — D62 Acute posthemorrhagic anemia: Secondary | ICD-10-CM | POA: Diagnosis not present

## 2023-05-07 DIAGNOSIS — J90 Pleural effusion, not elsewhere classified: Secondary | ICD-10-CM | POA: Diagnosis not present

## 2023-05-07 DIAGNOSIS — D630 Anemia in neoplastic disease: Secondary | ICD-10-CM | POA: Diagnosis not present

## 2023-05-07 DIAGNOSIS — K922 Gastrointestinal hemorrhage, unspecified: Secondary | ICD-10-CM | POA: Diagnosis not present

## 2023-05-07 DIAGNOSIS — C20 Malignant neoplasm of rectum: Secondary | ICD-10-CM | POA: Diagnosis not present

## 2023-05-07 DIAGNOSIS — J9601 Acute respiratory failure with hypoxia: Secondary | ICD-10-CM | POA: Diagnosis not present

## 2023-05-07 DIAGNOSIS — B9620 Unspecified Escherichia coli [E. coli] as the cause of diseases classified elsewhere: Secondary | ICD-10-CM | POA: Diagnosis not present

## 2023-05-07 DIAGNOSIS — J181 Lobar pneumonia, unspecified organism: Secondary | ICD-10-CM | POA: Diagnosis not present

## 2023-05-07 DIAGNOSIS — N3000 Acute cystitis without hematuria: Secondary | ICD-10-CM | POA: Diagnosis not present

## 2023-05-08 ENCOUNTER — Other Ambulatory Visit: Payer: Self-pay

## 2023-05-08 ENCOUNTER — Encounter: Payer: Self-pay | Admitting: Hematology & Oncology

## 2023-05-08 ENCOUNTER — Inpatient Hospital Stay (HOSPITAL_BASED_OUTPATIENT_CLINIC_OR_DEPARTMENT_OTHER): Payer: Medicare PPO | Admitting: Hematology & Oncology

## 2023-05-08 ENCOUNTER — Inpatient Hospital Stay: Payer: Medicare PPO

## 2023-05-08 VITALS — BP 165/59 | HR 70 | Temp 97.7°F | Resp 18 | Ht 60.0 in | Wt 148.0 lb

## 2023-05-08 DIAGNOSIS — Z95828 Presence of other vascular implants and grafts: Secondary | ICD-10-CM

## 2023-05-08 DIAGNOSIS — J189 Pneumonia, unspecified organism: Secondary | ICD-10-CM | POA: Diagnosis not present

## 2023-05-08 DIAGNOSIS — Z79899 Other long term (current) drug therapy: Secondary | ICD-10-CM | POA: Diagnosis not present

## 2023-05-08 DIAGNOSIS — C20 Malignant neoplasm of rectum: Secondary | ICD-10-CM | POA: Diagnosis not present

## 2023-05-08 DIAGNOSIS — R911 Solitary pulmonary nodule: Secondary | ICD-10-CM | POA: Diagnosis not present

## 2023-05-08 DIAGNOSIS — D500 Iron deficiency anemia secondary to blood loss (chronic): Secondary | ICD-10-CM

## 2023-05-08 DIAGNOSIS — Z79890 Hormone replacement therapy (postmenopausal): Secondary | ICD-10-CM | POA: Diagnosis not present

## 2023-05-08 DIAGNOSIS — Z7952 Long term (current) use of systemic steroids: Secondary | ICD-10-CM | POA: Diagnosis not present

## 2023-05-08 LAB — CMP (CANCER CENTER ONLY)
ALT: 17 U/L (ref 0–44)
AST: 12 U/L — ABNORMAL LOW (ref 15–41)
Albumin: 4 g/dL (ref 3.5–5.0)
Alkaline Phosphatase: 74 U/L (ref 38–126)
Anion gap: 7 (ref 5–15)
BUN: 13 mg/dL (ref 8–23)
CO2: 31 mmol/L (ref 22–32)
Calcium: 9.2 mg/dL (ref 8.9–10.3)
Chloride: 97 mmol/L — ABNORMAL LOW (ref 98–111)
Creatinine: 0.73 mg/dL (ref 0.44–1.00)
GFR, Estimated: >60 mL/min (ref >60)
Glucose, Bld: 134 mg/dL — ABNORMAL HIGH (ref 70–99)
Potassium: 3.2 mmol/L — ABNORMAL LOW (ref 3.5–5.1)
Sodium: 135 mmol/L (ref 135–145)
Total Bilirubin: 1 mg/dL (ref 0.3–1.2)
Total Protein: 6.1 g/dL — ABNORMAL LOW (ref 6.5–8.1)

## 2023-05-08 LAB — CBC WITH DIFFERENTIAL (CANCER CENTER ONLY)
Abs Immature Granulocytes: 0.05 10*3/uL (ref 0.00–0.07)
Basophils Absolute: 0 10*3/uL (ref 0.0–0.1)
Basophils Relative: 0 %
Eosinophils Absolute: 0.1 10*3/uL (ref 0.0–0.5)
Eosinophils Relative: 1 %
HCT: 38.2 % (ref 36.0–46.0)
Hemoglobin: 12.8 g/dL (ref 12.0–15.0)
Immature Granulocytes: 1 %
Lymphocytes Relative: 19 %
Lymphs Abs: 1.8 10*3/uL (ref 0.7–4.0)
MCH: 32.2 pg (ref 26.0–34.0)
MCHC: 33.5 g/dL (ref 30.0–36.0)
MCV: 96.2 fL (ref 80.0–100.0)
Monocytes Absolute: 0.6 10*3/uL (ref 0.1–1.0)
Monocytes Relative: 6 %
Neutro Abs: 7.4 10*3/uL (ref 1.7–7.7)
Neutrophils Relative %: 73 %
Platelet Count: 161 10*3/uL (ref 150–400)
RBC: 3.97 MIL/uL (ref 3.87–5.11)
RDW: 19.3 % — ABNORMAL HIGH (ref 11.5–15.5)
WBC Count: 9.9 10*3/uL (ref 4.0–10.5)
nRBC: 0 % (ref 0.0–0.2)

## 2023-05-08 LAB — CEA (IN HOUSE-CHCC): CEA (CHCC-In House): 1.94 ng/mL (ref 0.00–5.00)

## 2023-05-08 LAB — TSH: TSH: 0.574 u[IU]/mL (ref 0.350–4.500)

## 2023-05-08 LAB — IRON AND IRON BINDING CAPACITY (CC-WL,HP ONLY)
Iron: 202 ug/dL — ABNORMAL HIGH (ref 28–170)
Saturation Ratios: 69 % — ABNORMAL HIGH (ref 10.4–31.8)
TIBC: 294 ug/dL (ref 250–450)
UIBC: 92 ug/dL — ABNORMAL LOW (ref 148–442)

## 2023-05-08 LAB — FERRITIN: Ferritin: 373 ng/mL — ABNORMAL HIGH (ref 11–307)

## 2023-05-08 MED ORDER — SODIUM CHLORIDE 0.9% FLUSH
10.0000 mL | Freq: Once | INTRAVENOUS | Status: AC
  Administered 2023-05-08: 10 mL via INTRAVENOUS

## 2023-05-08 MED ORDER — HEPARIN SOD (PORK) LOCK FLUSH 100 UNIT/ML IV SOLN
500.0000 [IU] | Freq: Once | INTRAVENOUS | Status: AC
  Administered 2023-05-08: 500 [IU] via INTRAVENOUS

## 2023-05-08 NOTE — Patient Instructions (Signed)

## 2023-05-09 ENCOUNTER — Encounter: Payer: Self-pay | Admitting: Hematology & Oncology

## 2023-05-09 ENCOUNTER — Encounter: Payer: Self-pay | Admitting: *Deleted

## 2023-05-09 NOTE — Progress Notes (Signed)
Hematology and Oncology Follow Up Visit  [name] [id number] Sep 03, 1929 89 y.o. 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 3 weeks-s/p cycle #1  -- start on 02/09/2023 -- d/c on 04/12/2023 Xeloda 1500 mg po BID (14/7) -- s/p cycle #5 on 02/13/2023     Interim History:  [name] is back for follow-up.  I seem quite impressed with how well she is doing.  She is not wearing oxygen anymore, outside of that maybe it nighttime.  Her lungs certainly seem to be improving.  She does not have any count of rectal pain.  As such, had to believe that she has responded well to the Xeloda.  She has had no problems with bleeding.  There is no melena or bright red blood per rectum.  [name] is doing quite well.  After the prednisone that she was on probably has helped that.  I think we can decrease her prednisone down to 10 mg a day.  Again then hopefully discontinue this in the future.  She has had no fever.  There has been no nausea or vomiting.  Overall, I would say that her performance status is probably ECOG 2.    Her iron studies today showed a ferritin of 373 with an iron saturation of 69%.  She does have some PPE.  I told her to use some aloe vera lotion.  I would be a little bit comfortable with her using Voltaren as this might be absorbed and could potentially cause some bleeding.  Her last CEA level absolutely today, was 1.9.    Medications:  Current Outpatient Medications:    levothyroxine (SYNTHROID) 175 MCG tablet, Take 175 mcg by mouth every morning., Disp: , Rfl:    acetaminophen (TYLENOL) 325 MG tablet, Take 2 tablets (650 mg total) by mouth every 6 (six) hours as needed for mild pain (or Fever >/= 101)., Disp: 30 tablet, Rfl: 0   ALPRAZolam (XANAX) 0.5 MG tablet, Take 0.5 mg by mouth 3 (three) times daily as needed for anxiety. For anxiety, Disp: , Rfl:     amiodarone (PACERONE) 100 MG tablet, Take 1 tablet (100 mg total) by mouth daily., Disp: , Rfl:    amLODipine (NORVASC) 5 MG tablet, Take 1.5 tablets (7.5 mg total) by mouth daily., Disp: 135 tablet, Rfl: 3   capecitabine (XELODA) 500 MG tablet, Take 3 tablets (1,500 mg total) by mouth 2 (two) times daily after a meal. Take within 30 minutes after meals. Take for 14 days on, 7 days off. Repeat every 21 days., Disp: 84 tablet, Rfl: 5   cholecalciferol (VITAMIN D3) 25 MCG (1000 UNIT) tablet, Take 1,000 Units by mouth daily., Disp: , Rfl:    cyanocobalamin 1000 MCG tablet, Take 1 tablet (1,000 mcg total) by mouth daily., Disp: 30 tablet, Rfl: 0   escitalopram (LEXAPRO) 5 MG tablet, Take 5 mg by mouth at bedtime., Disp: , Rfl:    famotidine (PEPCID) 20 MG tablet, Take 20 mg by mouth daily., Disp: , Rfl:    feeding supplement (ENSURE ENLIVE / ENSURE PLUS) LIQD, Take 237 mLs by mouth 3 (three) times daily with meals. (Patient taking differently: Take 237 mLs by mouth 3 (three) times daily as needed (nutrition).), Disp: 237 mL, Rfl: 12   fluticasone (FLONASE) 50 MCG/ACT nasal spray, Place 2 sprays into both nostrils daily. (Patient taking differently: Place 2 sprays into both nostrils daily as needed  for allergies.), Disp: 11.1 mL, Rfl: 0   guaiFENesin-dextromethorphan (ROBITUSSIN DM) 100-10 MG/5ML syrup, Take 10 mLs by mouth every 4 (four) hours as needed for cough., Disp: 118 mL, Rfl: 0   HYDROcodone-acetaminophen (NORCO/VICODIN) 5-325 MG tablet, Take 1-2 tablets by mouth every 4 (four) hours as needed for moderate pain., Disp: 30 tablet, Rfl: 0   lidocaine-prilocaine (EMLA) cream, Apply 1 Application topically as needed., Disp: 30 g, Rfl: 4   loratadine (CLARITIN) 10 MG tablet, Take 1 tablet (10 mg total) by mouth daily., Disp: 30 tablet, Rfl: 0   methocarbamol (ROBAXIN) 500 MG tablet, Take 1 tablet (500 mg total) by mouth every 8 (eight) hours as needed for muscle spasms (abd pain)., Disp: 10 tablet, Rfl:  0   ondansetron (ZOFRAN) 8 MG tablet, Take 1 tablet (8 mg total) by mouth every 8 (eight) hours as needed for nausea or vomiting., Disp: 20 tablet, Rfl: 0   polyethylene glycol (MIRALAX / GLYCOLAX) 17 g packet, Take 17 g by mouth daily as needed for mild constipation. (Patient taking differently: Take 17 g by mouth at bedtime.), Disp: 14 each, Rfl: 0   potassium chloride SA (K-DUR,KLOR-CON) 20 MEQ tablet, Take 20 mEq by mouth daily., Disp: , Rfl:    predniSONE (DELTASONE) 20 MG tablet, Take 1 tablet (20 mg total) by mouth daily with breakfast., Disp: 30 tablet, Rfl: 2   TRUE METRIX BLOOD GLUCOSE TEST test strip, SMARTSIG:Via Meter, Disp: , Rfl:   Allergies:  Allergies  Allergen Reactions   Aggrenox [Aspirin-Dipyridamole Er] Other (See Comments)    Severe Bleeding and Stomach Pain   Prednisone Other (See Comments)    Climbs the walls    Past Medical History, Surgical history, Social history, and Family History were reviewed and updated.  Review of Systems: Review of Systems  Constitutional:  Positive for fatigue.  HENT:  Negative.    Eyes: Negative.   Respiratory: Negative.    Cardiovascular:  Positive for palpitations.  Gastrointestinal:  Positive for blood in stool and rectal pain.  Endocrine: Negative.   Genitourinary: Negative.    Musculoskeletal: Negative.   Skin: Negative.   Neurological:  Positive for light-headedness.  Hematological: Negative.   Psychiatric/Behavioral: Negative.      Physical Exam:  height is 5' (1.524 m) and weight is 148 lb (67.1 kg). Her oral temperature is 97.7 F (36.5 C). Her blood pressure is 165/59 (abnormal) and her pulse is 70. Her respiration is 18 and oxygen saturation is 97%.   Wt Readings from Last 3 Encounters:  05/08/23 148 lb (67.1 kg)  04/24/23 146 lb 12.8 oz (66.6 kg)  04/20/23 145 lb (65.8 kg)    Physical Exam Vitals reviewed.  HENT:     Head: Normocephalic and atraumatic.  Eyes:     Pupils: Pupils are equal, round, and  reactive to light.  Cardiovascular:     Rate and Rhythm: Normal rate and regular rhythm.     Heart sounds: Normal heart sounds.     Comments: Cardiac exam is regular rate and rhythm.  She has no murmurs, rubs or bruits. Pulmonary:     Effort: Pulmonary effort is normal.     Breath sounds: Normal breath sounds.     Comments: She has good air movement bilaterally.  She has some crackles bilaterally.  I hear no wheezes. Abdominal:     General: Bowel sounds are normal.     Palpations: Abdomen is soft.  Genitourinary:    Comments: Rectal exam showed very small  external hemorrhoids.  There is no mass in the rectal vault.  Her stool is black and heme positive. Musculoskeletal:        General: No tenderness or deformity. Normal range of motion.     Cervical back: Normal range of motion.  Lymphadenopathy:     Cervical: No cervical adenopathy.  Skin:    General: Skin is warm and dry.     Findings: No erythema or rash.  Neurological:     Mental Status: She is alert and oriented to person, place, and time.  Psychiatric:        Behavior: Behavior normal.        Thought Content: Thought content normal.        Judgment: Judgment normal.     Lab Results  Component Value Date   WBC 9.9 05/08/2023   HGB 12.8 05/08/2023   HCT 38.2 05/08/2023   MCV 96.2 05/08/2023   PLT 161 05/08/2023     Chemistry      Component Value Date/Time   NA 135 05/08/2023 1040   K 3.2 (L) 05/08/2023 1040   CL 97 (L) 05/08/2023 1040   CO2 31 05/08/2023 1040   BUN 13 05/08/2023 1040   CREATININE 0.73 05/08/2023 1040      Component Value Date/Time   CALCIUM 9.2 05/08/2023 1040   ALKPHOS 74 05/08/2023 1040   AST 12 (L) 05/08/2023 1040   ALT 17 05/08/2023 1040   BILITOT 1.0 05/08/2023 1040      Impression and Plan: [name] is a very charming 89 year old white female.  She has at least a locally advanced adenocarcinoma of the rectum.  There is a pulmonary nodule in the right upper lobe.  It measures 15  mm.  Again, I would not imagine that she would be symptomatic from this.  At this point, we will reevaluate her.  We will do another rectal MRI.  I would also like to get a CT of the chest.  This way, we can also look at her pneumonia.  Again, her quality of life is doing so well right now.  I would like to believe that she is responding to the Xeloda.  I told her to hold the Xeloda for 2 weeks.  We will then plan to get her back to see her in about a month.   Josph Macho, MD 8/28/202412:14 PM

## 2023-05-09 NOTE — Progress Notes (Unsigned)
Patient will need MRI and CT prior to her next appointment. Message sent via MyChart with instructions on how to schedule sent to patient.   Oncology Nurse Navigator Documentation     05/09/2023    1:15 PM  Oncology Nurse Navigator Flowsheets  Navigator Follow Up Date: 05/17/2023  Navigator Follow Up Reason: Scan Review  Navigator Location [facility]  Navigator Encounter Type Appt/Treatment Plan Review;MyChart  Patient Visit Type MedOnc  Treatment Phase Active Tx  Barriers/Navigation Needs Coordination of Care;Education  Education Other  Interventions Education  Acuity Level 2-Minimal Needs (1-2 Barriers Identified)  Education Method Written  Support Groups/Services Friends and Family  Time Spent with Patient 15

## 2023-05-10 ENCOUNTER — Encounter: Payer: Self-pay | Admitting: *Deleted

## 2023-05-10 ENCOUNTER — Encounter: Payer: Self-pay | Admitting: Hematology & Oncology

## 2023-05-10 NOTE — Progress Notes (Signed)
Call received from central scheduling to inform Dr Myna Hidalgo that since pt had Feraheme on 04/12/23, MRI can not be done until three months after the [facility] Fall was given. MRI ordered for September 2024 by Dr. Myna Hidalgo. Dr. Myna Hidalgo spoke with Dr. Bradly Chris and they both agree that it will be ok for the MRI to be done in early October.  Central scheduling notified.

## 2023-05-17 ENCOUNTER — Ambulatory Visit (HOSPITAL_COMMUNITY)
Admission: RE | Admit: 2023-05-17 | Discharge: 2023-05-17 | Disposition: A | Discharge status: Home / Self Care | Payer: Medicare PPO | Source: Ambulatory Visit | Attending: Hematology & Oncology | Admitting: Hematology & Oncology

## 2023-05-17 DIAGNOSIS — C20 Malignant neoplasm of rectum: Secondary | ICD-10-CM | POA: Insufficient documentation

## 2023-05-17 DIAGNOSIS — I700 Atherosclerosis of aorta: Secondary | ICD-10-CM | POA: Diagnosis not present

## 2023-05-17 DIAGNOSIS — R918 Other nonspecific abnormal finding of lung field: Secondary | ICD-10-CM | POA: Diagnosis not present

## 2023-05-17 DIAGNOSIS — J90 Pleural effusion, not elsewhere classified: Secondary | ICD-10-CM | POA: Diagnosis not present

## 2023-05-17 MED ORDER — IOHEXOL 300 MG/ML  SOLN
75.0000 mL | Freq: Once | INTRAMUSCULAR | Status: AC | PRN
  Administered 2023-05-17: 75 mL via INTRAVENOUS

## 2023-05-17 MED ORDER — HEPARIN SOD (PORK) LOCK FLUSH 100 UNIT/ML IV SOLN
INTRAVENOUS | Status: AC
  Filled 2023-05-17: qty 5

## 2023-05-17 MED ORDER — HEPARIN SOD (PORK) LOCK FLUSH 100 UNIT/ML IV SOLN
500.0000 [IU] | Freq: Once | INTRAVENOUS | Status: AC
  Administered 2023-05-17: 500 [IU] via INTRAVENOUS

## 2023-05-18 DIAGNOSIS — J181 Lobar pneumonia, unspecified organism: Secondary | ICD-10-CM | POA: Diagnosis not present

## 2023-05-18 DIAGNOSIS — J90 Pleural effusion, not elsewhere classified: Secondary | ICD-10-CM | POA: Diagnosis not present

## 2023-05-18 DIAGNOSIS — K922 Gastrointestinal hemorrhage, unspecified: Secondary | ICD-10-CM | POA: Diagnosis not present

## 2023-05-18 DIAGNOSIS — D62 Acute posthemorrhagic anemia: Secondary | ICD-10-CM | POA: Diagnosis not present

## 2023-05-18 DIAGNOSIS — D630 Anemia in neoplastic disease: Secondary | ICD-10-CM | POA: Diagnosis not present

## 2023-05-18 DIAGNOSIS — N3000 Acute cystitis without hematuria: Secondary | ICD-10-CM | POA: Diagnosis not present

## 2023-05-18 DIAGNOSIS — C20 Malignant neoplasm of rectum: Secondary | ICD-10-CM | POA: Diagnosis not present

## 2023-05-18 DIAGNOSIS — B9620 Unspecified Escherichia coli [E. coli] as the cause of diseases classified elsewhere: Secondary | ICD-10-CM | POA: Diagnosis not present

## 2023-05-18 DIAGNOSIS — J9601 Acute respiratory failure with hypoxia: Secondary | ICD-10-CM | POA: Diagnosis not present

## 2023-05-21 ENCOUNTER — Encounter: Payer: Self-pay | Admitting: Hematology & Oncology

## 2023-05-22 ENCOUNTER — Encounter: Payer: Self-pay | Admitting: Hematology & Oncology

## 2023-05-23 ENCOUNTER — Encounter: Payer: Self-pay | Admitting: Hematology & Oncology

## 2023-05-24 ENCOUNTER — Encounter: Payer: Self-pay | Admitting: *Deleted

## 2023-05-24 NOTE — Progress Notes (Signed)
Reviewed CT scan which was negative for any malignancy.   Oncology Nurse Navigator Documentation     05/24/2023    7:45 AM  Oncology Nurse Navigator Flowsheets  Navigator Follow Up Date: 06/11/2023  Navigator Follow Up Reason: Scan Review  Navigator Location [facility]  Navigator Encounter Type Scan Review  Patient Visit Type MedOnc  Treatment Phase Active Tx  Barriers/Navigation Needs Coordination of Care;Education  Interventions None Required  Acuity Level 2-Minimal Needs (1-2 Barriers Identified)  Support Groups/Services Friends and Family  Time Spent with Patient 15

## 2023-05-25 ENCOUNTER — Encounter: Payer: Self-pay | Admitting: Hematology & Oncology

## 2023-05-28 DIAGNOSIS — E039 Hypothyroidism, unspecified: Secondary | ICD-10-CM | POA: Diagnosis not present

## 2023-05-29 ENCOUNTER — Ambulatory Visit: Payer: Medicare PPO | Admitting: Cardiovascular Disease

## 2023-05-29 ENCOUNTER — Other Ambulatory Visit (HOSPITAL_COMMUNITY): Payer: Self-pay

## 2023-05-30 ENCOUNTER — Encounter: Payer: Self-pay | Admitting: Hematology & Oncology

## 2023-06-01 DIAGNOSIS — K922 Gastrointestinal hemorrhage, unspecified: Secondary | ICD-10-CM | POA: Diagnosis not present

## 2023-06-01 DIAGNOSIS — B9620 Unspecified Escherichia coli [E. coli] as the cause of diseases classified elsewhere: Secondary | ICD-10-CM | POA: Diagnosis not present

## 2023-06-01 DIAGNOSIS — J90 Pleural effusion, not elsewhere classified: Secondary | ICD-10-CM | POA: Diagnosis not present

## 2023-06-01 DIAGNOSIS — C20 Malignant neoplasm of rectum: Secondary | ICD-10-CM | POA: Diagnosis not present

## 2023-06-01 DIAGNOSIS — J181 Lobar pneumonia, unspecified organism: Secondary | ICD-10-CM | POA: Diagnosis not present

## 2023-06-01 DIAGNOSIS — N3000 Acute cystitis without hematuria: Secondary | ICD-10-CM | POA: Diagnosis not present

## 2023-06-01 DIAGNOSIS — D62 Acute posthemorrhagic anemia: Secondary | ICD-10-CM | POA: Diagnosis not present

## 2023-06-01 DIAGNOSIS — J189 Pneumonia, unspecified organism: Secondary | ICD-10-CM | POA: Diagnosis not present

## 2023-06-01 DIAGNOSIS — D630 Anemia in neoplastic disease: Secondary | ICD-10-CM | POA: Diagnosis not present

## 2023-06-01 DIAGNOSIS — J9601 Acute respiratory failure with hypoxia: Secondary | ICD-10-CM | POA: Diagnosis not present

## 2023-06-05 ENCOUNTER — Ambulatory Visit: Payer: Medicare PPO | Admitting: Hematology & Oncology

## 2023-06-05 ENCOUNTER — Inpatient Hospital Stay: Payer: Medicare PPO

## 2023-06-08 ENCOUNTER — Encounter: Payer: Self-pay | Admitting: Hematology & Oncology

## 2023-06-11 ENCOUNTER — Ambulatory Visit (HOSPITAL_COMMUNITY)
Admission: RE | Admit: 2023-06-11 | Discharge: 2023-06-11 | Disposition: A | Discharge status: Home / Self Care | Payer: Medicare PPO | Source: Ambulatory Visit | Attending: Hematology & Oncology | Admitting: Hematology & Oncology

## 2023-06-11 ENCOUNTER — Encounter: Payer: Self-pay | Admitting: Hematology & Oncology

## 2023-06-11 DIAGNOSIS — C20 Malignant neoplasm of rectum: Secondary | ICD-10-CM | POA: Insufficient documentation

## 2023-06-15 ENCOUNTER — Encounter: Payer: Self-pay | Admitting: Hematology & Oncology

## 2023-06-15 ENCOUNTER — Other Ambulatory Visit (HOSPITAL_COMMUNITY): Payer: Self-pay

## 2023-06-15 ENCOUNTER — Other Ambulatory Visit (HOSPITAL_COMMUNITY): Payer: Self-pay | Admitting: Pharmacy Technician

## 2023-06-15 NOTE — Progress Notes (Signed)
Specialty Pharmacy Refill Coordination Note  [name] is a 89 y.o. female contacted today regarding refills of specialty medication(s) Capecitabine  Spoke with Son [name].  Patient requested Delivery   Delivery date: 06/20/23   Verified address: [address] [place], [place]   Medication will be filled on 06/19/23.   New cycle start on 06/26/23

## 2023-06-18 ENCOUNTER — Encounter: Payer: Self-pay | Admitting: Hematology & Oncology

## 2023-06-18 ENCOUNTER — Inpatient Hospital Stay (HOSPITAL_BASED_OUTPATIENT_CLINIC_OR_DEPARTMENT_OTHER): Payer: Medicare PPO | Admitting: Hematology & Oncology

## 2023-06-18 ENCOUNTER — Inpatient Hospital Stay: Payer: Medicare PPO | Attending: Hematology & Oncology

## 2023-06-18 ENCOUNTER — Inpatient Hospital Stay: Payer: Medicare PPO

## 2023-06-18 ENCOUNTER — Other Ambulatory Visit: Payer: Self-pay | Admitting: Hematology & Oncology

## 2023-06-18 VITALS — BP 194/65 | HR 85 | Temp 97.7°F | Resp 20 | Ht 60.0 in | Wt 154.2 lb

## 2023-06-18 DIAGNOSIS — I4891 Unspecified atrial fibrillation: Secondary | ICD-10-CM | POA: Insufficient documentation

## 2023-06-18 DIAGNOSIS — C20 Malignant neoplasm of rectum: Secondary | ICD-10-CM | POA: Insufficient documentation

## 2023-06-18 DIAGNOSIS — D500 Iron deficiency anemia secondary to blood loss (chronic): Secondary | ICD-10-CM

## 2023-06-18 DIAGNOSIS — R911 Solitary pulmonary nodule: Secondary | ICD-10-CM | POA: Insufficient documentation

## 2023-06-18 LAB — CMP (CANCER CENTER ONLY)
ALT: 31 U/L (ref 0–44)
AST: 26 U/L (ref 15–41)
Albumin: 4.1 g/dL (ref 3.5–5.0)
Alkaline Phosphatase: 74 U/L (ref 38–126)
Anion gap: 9 (ref 5–15)
BUN: 12 mg/dL (ref 8–23)
CO2: 30 mmol/L (ref 22–32)
Calcium: 9.4 mg/dL (ref 8.9–10.3)
Chloride: 100 mmol/L (ref 98–111)
Creatinine: 0.63 mg/dL (ref 0.44–1.00)
GFR, Estimated: >60 mL/min (ref >60)
Glucose, Bld: 118 mg/dL — ABNORMAL HIGH (ref 70–99)
Potassium: 3.4 mmol/L — ABNORMAL LOW (ref 3.5–5.1)
Sodium: 139 mmol/L (ref 135–145)
Total Bilirubin: 0.7 mg/dL (ref 0.3–1.2)
Total Protein: 6.8 g/dL (ref 6.5–8.1)

## 2023-06-18 LAB — CBC WITH DIFFERENTIAL (CANCER CENTER ONLY)
Abs Immature Granulocytes: 0.05 10*3/uL (ref 0.00–0.07)
Basophils Absolute: 0.1 10*3/uL (ref 0.0–0.1)
Basophils Relative: 1 %
Eosinophils Absolute: 0.2 10*3/uL (ref 0.0–0.5)
Eosinophils Relative: 2 %
HCT: 37.9 % (ref 36.0–46.0)
Hemoglobin: 12.8 g/dL (ref 12.0–15.0)
Immature Granulocytes: 1 %
Lymphocytes Relative: 39 %
Lymphs Abs: 2.6 10*3/uL (ref 0.7–4.0)
MCH: 32.1 pg (ref 26.0–34.0)
MCHC: 33.8 g/dL (ref 30.0–36.0)
MCV: 95 fL (ref 80.0–100.0)
Monocytes Absolute: 0.5 10*3/uL (ref 0.1–1.0)
Monocytes Relative: 8 %
Neutro Abs: 3.2 10*3/uL (ref 1.7–7.7)
Neutrophils Relative %: 49 %
Platelet Count: 247 10*3/uL (ref 150–400)
RBC: 3.99 MIL/uL (ref 3.87–5.11)
RDW: 16.7 % — ABNORMAL HIGH (ref 11.5–15.5)
WBC Count: 6.5 10*3/uL (ref 4.0–10.5)
nRBC: 0 % (ref 0.0–0.2)

## 2023-06-18 LAB — RETICULOCYTES
Immature Retic Fract: 15.3 % (ref 2.3–15.9)
RBC.: 3.98 MIL/uL (ref 3.87–5.11)
Retic Count, Absolute: 95.5 10*3/uL (ref 19.0–186.0)
Retic Ct Pct: 2.4 % (ref 0.4–3.1)

## 2023-06-18 LAB — IRON AND IRON BINDING CAPACITY (CC-WL,HP ONLY)
Iron: 111 ug/dL (ref 28–170)
Saturation Ratios: 32 % — ABNORMAL HIGH (ref 10.4–31.8)
TIBC: 343 ug/dL (ref 250–450)
UIBC: 232 ug/dL (ref 148–442)

## 2023-06-18 LAB — LACTATE DEHYDROGENASE: LDH: 202 U/L — ABNORMAL HIGH (ref 98–192)

## 2023-06-18 LAB — FERRITIN: Ferritin: 270 ng/mL (ref 11–307)

## 2023-06-18 LAB — CEA (IN HOUSE-CHCC): CEA (CHCC-In House): 1.38 ng/mL (ref 0.00–5.00)

## 2023-06-18 MED ORDER — HEPARIN SOD (PORK) LOCK FLUSH 100 UNIT/ML IV SOLN
500.0000 [IU] | Freq: Once | INTRAVENOUS | Status: AC | PRN
  Administered 2023-06-18: 500 [IU]

## 2023-06-18 MED ORDER — TRIAMTERENE-HCTZ 37.5-25 MG PO TABS: 1.0000 | ORAL_TABLET | Freq: Every day | ORAL | 3 refills | Status: DC

## 2023-06-18 MED ORDER — SODIUM CHLORIDE 0.9% FLUSH
10.0000 mL | Freq: Once | INTRAVENOUS | Status: AC | PRN
  Administered 2023-06-18: 10 mL

## 2023-06-18 NOTE — Patient Instructions (Signed)

## 2023-06-18 NOTE — Progress Notes (Signed)
Hematology and Oncology Follow Up Visit  Niajah R Jehle [id number] [birth year] 89 y.o. 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 3 weeks-s/p cycle #1  -- start on 02/09/2023 -- d/c on 04/12/2023 Xeloda 1500 mg po BID (14/7) -- s/p cycle #6 on 02/13/2023     Interim History:  Ms. Harrower is back for follow-up.  She has a looks incredibly well.  She is doing quite nicely.  She really has had no issues to date.  We have adjusted the Xeloda just because of some of the PPE that happened to her hands and feet.  Her feet still look a little bit red on the bottom.  We told her to use some Voltaren cream for this.  She had an MRI of the pelvis a week or so ago.  Unfortunately, we still do not have the reading back.  Her last CEA level was down to 1.94.  She has had no problems with cough.  There is no shortness of breath.  She does not wish to have the oxygen any longer.  Does have little bit of swelling in the lower legs.  I think we could try some Maxide on her.  She does see her primary care doctor in about a week or so.  Hopefully, he can help adjust her blood pressure medications since her blood pressure is on the high side.  She has had no bleeding.  The past atrial fibrillation is not an issue.  Today, her heart seems to be in rhythm.  She has not had any pelvic pain..  She has not had any issues with bowels or bladder.  She has had no constipation.  Her last iron studies that were done in August showed a ferritin of 373 with an iron saturation of 69%.  Overall, I think that her performance status is probably ECOG 1.    Medications:  Current Outpatient Medications:    acetaminophen (TYLENOL) 325 MG tablet, Take 2 tablets (650 mg total) by mouth every 6 (six) hours as needed for mild pain (or Fever >/= 101)., Disp: 30 tablet, Rfl: 0   ALPRAZolam (XANAX) 0.5 MG tablet, Take  0.5 mg by mouth 3 (three) times daily as needed for anxiety. For anxiety, Disp: , Rfl:    amiodarone (PACERONE) 100 MG tablet, Take 100 mg by mouth daily., Disp: , Rfl:    amLODipine (NORVASC) 5 MG tablet, Take 7.5 mg by mouth., Disp: , Rfl:    capecitabine (XELODA) 500 MG tablet, Take 3 tablets (1,500 mg total) by mouth 2 (two) times daily after a meal. Take within 30 minutes after meals. Take for 14 days on, 7 days off. Repeat every 21 days., Disp: 84 tablet, Rfl: 5   cholecalciferol (VITAMIN D3) 25 MCG (1000 UNIT) tablet, Take 1,000 Units by mouth daily., Disp: , Rfl:    cyanocobalamin 1000 MCG tablet, Take 1 tablet (1,000 mcg total) by mouth daily., Disp: 30 tablet, Rfl: 0   escitalopram (LEXAPRO) 5 MG tablet, Take 5 mg by mouth at bedtime., Disp: , Rfl:    famotidine (PEPCID) 20 MG tablet, Take 20 mg by mouth daily., Disp: , Rfl:    feeding supplement (ENSURE ENLIVE / ENSURE PLUS) LIQD, Take 237 mLs by mouth 3 (three) times daily with meals. (Patient taking differently: Take 237 mLs by mouth 3 (three) times daily as needed (nutrition).), Disp: 237 mL, Rfl: 12   fluticasone (  FLONASE) 50 MCG/ACT nasal spray, Place 2 sprays into both nostrils daily. (Patient taking differently: Place 2 sprays into both nostrils daily as needed for allergies.), Disp: 11.1 mL, Rfl: 0   levothyroxine (SYNTHROID) 175 MCG tablet, Take 175 mcg by mouth every morning., Disp: , Rfl:    lidocaine-prilocaine (EMLA) cream, Apply 1 Application topically as needed., Disp: 30 g, Rfl: 4   loratadine (CLARITIN) 10 MG tablet, Take 1 tablet (10 mg total) by mouth daily., Disp: 30 tablet, Rfl: 0   potassium chloride SA (K-DUR,KLOR-CON) 20 MEQ tablet, Take 20 mEq by mouth daily., Disp: , Rfl:    triamterene-hydrochlorothiazide (MAXZIDE-25) 37.5-25 MG tablet, Take 1 tablet by mouth daily., Disp: 30 tablet, Rfl: 3   amLODipine (NORVASC) 5 MG tablet, Take 1.5 tablets (7.5 mg total) by mouth daily., Disp: 135 tablet, Rfl: 3    guaiFENesin-dextromethorphan (ROBITUSSIN DM) 100-10 MG/5ML syrup, Take 10 mLs by mouth every 4 (four) hours as needed for cough. (Patient not taking: Reported on 06/18/2023), Disp: 118 mL, Rfl: 0   HYDROcodone-acetaminophen (NORCO/VICODIN) 5-325 MG tablet, Take 1-2 tablets by mouth every 4 (four) hours as needed for moderate pain. (Patient not taking: Reported on 06/18/2023), Disp: 30 tablet, Rfl: 0   methocarbamol (ROBAXIN) 500 MG tablet, Take 1 tablet (500 mg total) by mouth every 8 (eight) hours as needed for muscle spasms (abd pain). (Patient not taking: Reported on 06/18/2023), Disp: 10 tablet, Rfl: 0   ondansetron (ZOFRAN) 8 MG tablet, Take 1 tablet (8 mg total) by mouth every 8 (eight) hours as needed for nausea or vomiting. (Patient not taking: Reported on 06/18/2023), Disp: 20 tablet, Rfl: 0   polyethylene glycol (MIRALAX / GLYCOLAX) 17 g packet, Take 17 g by mouth daily as needed for mild constipation. (Patient not taking: Reported on 06/18/2023), Disp: 14 each, Rfl: 0   prochlorperazine (COMPAZINE) 10 MG tablet, Take 10 mg by mouth every 6 (six) hours as needed. (Patient not taking: Reported on 06/18/2023), Disp: , Rfl:    TRUE METRIX BLOOD GLUCOSE TEST test strip, SMARTSIG:Via Meter (Patient not taking: Reported on 06/18/2023), Disp: , Rfl:   Allergies:  Allergies  Allergen Reactions   Aggrenox [Aspirin-Dipyridamole Er] Other (See Comments)    Severe Bleeding and Stomach Pain   Prednisone Other (See Comments)    Climbs the walls    Past Medical History, Surgical history, Social history, and Family History were reviewed and updated.  Review of Systems: Review of Systems  Constitutional:  Positive for fatigue.  HENT:  Negative.    Eyes: Negative.   Respiratory: Negative.    Cardiovascular:  Positive for palpitations.  Gastrointestinal:  Positive for blood in stool and rectal pain.  Endocrine: Negative.   Genitourinary: Negative.    Musculoskeletal: Negative.   Skin: Negative.    Neurological:  Positive for light-headedness.  Hematological: Negative.   Psychiatric/Behavioral: Negative.      Physical Exam:  height is 5' (1.524 m) and weight is 154 lb 3.2 oz (69.9 kg). Her oral temperature is 97.7 F (36.5 C). Her blood pressure is 194/65 (abnormal) and her pulse is 85. Her respiration is 20 and oxygen saturation is 96%.   Wt Readings from Last 3 Encounters:  06/18/23 154 lb 3.2 oz (69.9 kg)  05/08/23 148 lb (67.1 kg)  04/24/23 146 lb 12.8 oz (66.6 kg)    Physical Exam Vitals reviewed.  HENT:     Head: Normocephalic and atraumatic.  Eyes:     Pupils: Pupils are equal, round, and  reactive to light.  Cardiovascular:     Rate and Rhythm: Normal rate and regular rhythm.     Heart sounds: Normal heart sounds.     Comments: Cardiac exam is regular rate and rhythm.  She has no murmurs, rubs or bruits. Pulmonary:     Effort: Pulmonary effort is normal.     Breath sounds: Normal breath sounds.     Comments: She has good air movement bilaterally.  She has some crackles bilaterally.  I hear no wheezes. Abdominal:     General: Bowel sounds are normal.     Palpations: Abdomen is soft.  Genitourinary:    Comments: Rectal exam showed very small external hemorrhoids.  There is no mass in the rectal vault.  Her stool is black and heme positive. Musculoskeletal:        General: No tenderness or deformity. Normal range of motion.     Cervical back: Normal range of motion.  Lymphadenopathy:     Cervical: No cervical adenopathy.  Skin:    General: Skin is warm and dry.     Findings: No erythema or rash.  Neurological:     Mental Status: She is alert and oriented to person, place, and time.  Psychiatric:        Behavior: Behavior normal.        Thought Content: Thought content normal.        Judgment: Judgment normal.     Lab Results  Component Value Date   WBC 6.5 06/18/2023   HGB 12.8 06/18/2023   HCT 37.9 06/18/2023   MCV 95.0 06/18/2023   PLT 247  06/18/2023     Chemistry      Component Value Date/Time   NA 139 06/18/2023 1250   K 3.4 (L) 06/18/2023 1250   CL 100 06/18/2023 1250   CO2 30 06/18/2023 1250   BUN 12 06/18/2023 1250   CREATININE 0.63 06/18/2023 1250      Component Value Date/Time   CALCIUM 9.4 06/18/2023 1250   ALKPHOS 74 06/18/2023 1250   AST 26 06/18/2023 1250   ALT 31 06/18/2023 1250   BILITOT 0.7 06/18/2023 1250      Impression and Plan: Ms. Torrence is a very charming 89 year old white female.  She has at least a locally advanced adenocarcinoma of the rectum.  There is a pulmonary nodule in the right upper lobe.  It measures 15 mm.  Again, I would not imagine that she would be symptomatic from this.  I have to believe that the pelvic MRI should be okay.  We should see a response.  We did her last CT of the chest, there is no evidence of any malignancy in the chest.  Again, her quality life is doing quite well right now.  I do not see a problem with her going to see a podiatrist for her feet.  She does need to have her toenails trimmed as she does have issues with fungus.  We will plan to get her back in about 6 weeks or so.  I would like to try to get her back before the Holiday season and then if all looks good, get her back after the Holidays.    Josph Macho, MD 10/7/20241:52 PM

## 2023-06-18 NOTE — Progress Notes (Signed)
BP remains elevated, 189/65, has not taken BP meds today. Son verifies she takes Norvasc and Amiodarone.  Instructed to take BP daily and record and notify PCP if it remains over 140/90.  Verbalized understanding.  Dr. Myna Hidalgo notified.

## 2023-06-19 ENCOUNTER — Encounter: Payer: Self-pay | Admitting: Hematology & Oncology

## 2023-06-19 ENCOUNTER — Encounter: Payer: Self-pay | Admitting: *Deleted

## 2023-06-19 NOTE — Progress Notes (Signed)
Reviewed MRI which shows continued treatment response. She will continue current treatment plan.  Oncology Nurse Navigator Documentation     06/19/2023    7:45 AM  Oncology Nurse Navigator Flowsheets  Navigator Follow Up Date: 08/02/2023  Navigator Follow Up Reason: Follow-up Appointment  Navigator Location [facility]  Navigator Encounter Type Scan Review;Appt/Treatment Plan Review  Patient Visit Type MedOnc  Treatment Phase Active Tx  Barriers/Navigation Needs Coordination of Care;Education  Interventions None Required  Acuity Level 2-Minimal Needs (1-2 Barriers Identified)  Support Groups/Services Friends and Family  Time Spent with Patient 15

## 2023-06-22 ENCOUNTER — Encounter: Payer: Self-pay | Admitting: Hematology & Oncology

## 2023-06-26 ENCOUNTER — Encounter: Payer: Self-pay | Admitting: Hematology & Oncology

## 2023-06-26 DIAGNOSIS — F1320 Sedative, hypnotic or anxiolytic dependence, uncomplicated: Secondary | ICD-10-CM | POA: Diagnosis not present

## 2023-06-26 DIAGNOSIS — F419 Anxiety disorder, unspecified: Secondary | ICD-10-CM | POA: Diagnosis not present

## 2023-06-26 DIAGNOSIS — I350 Nonrheumatic aortic (valve) stenosis: Secondary | ICD-10-CM | POA: Diagnosis not present

## 2023-06-26 DIAGNOSIS — Z23 Encounter for immunization: Secondary | ICD-10-CM | POA: Diagnosis not present

## 2023-06-26 DIAGNOSIS — E039 Hypothyroidism, unspecified: Secondary | ICD-10-CM | POA: Diagnosis not present

## 2023-06-26 DIAGNOSIS — E785 Hyperlipidemia, unspecified: Secondary | ICD-10-CM | POA: Diagnosis not present

## 2023-06-26 DIAGNOSIS — F329 Major depressive disorder, single episode, unspecified: Secondary | ICD-10-CM | POA: Diagnosis not present

## 2023-06-26 DIAGNOSIS — E119 Type 2 diabetes mellitus without complications: Secondary | ICD-10-CM | POA: Diagnosis not present

## 2023-06-26 DIAGNOSIS — D509 Iron deficiency anemia, unspecified: Secondary | ICD-10-CM | POA: Diagnosis not present

## 2023-06-26 DIAGNOSIS — C20 Malignant neoplasm of rectum: Secondary | ICD-10-CM | POA: Diagnosis not present

## 2023-07-01 ENCOUNTER — Other Ambulatory Visit: Payer: Self-pay | Admitting: Hematology & Oncology

## 2023-07-01 DIAGNOSIS — E038 Other specified hypothyroidism: Secondary | ICD-10-CM

## 2023-07-02 ENCOUNTER — Encounter: Payer: Self-pay | Admitting: Hematology & Oncology

## 2023-07-03 DIAGNOSIS — S93402A Sprain of unspecified ligament of left ankle, initial encounter: Secondary | ICD-10-CM | POA: Diagnosis not present

## 2023-07-03 DIAGNOSIS — M25572 Pain in left ankle and joints of left foot: Secondary | ICD-10-CM | POA: Diagnosis not present

## 2023-07-04 ENCOUNTER — Ambulatory Visit: Payer: Medicare PPO | Admitting: Podiatry

## 2023-07-04 ENCOUNTER — Other Ambulatory Visit: Payer: Self-pay

## 2023-07-04 ENCOUNTER — Other Ambulatory Visit: Payer: Self-pay | Admitting: Hematology & Oncology

## 2023-07-04 DIAGNOSIS — C20 Malignant neoplasm of rectum: Secondary | ICD-10-CM

## 2023-07-04 NOTE — Progress Notes (Signed)
Specialty Pharmacy Refill Coordination Note  [name] is a 89 y.o. female contacted today regarding refills of specialty medication(s) Capecitabine   Patient requested Delivery   Delivery date: 07/10/23   Verified address: [address]  [place] [address]   Medication will be filled on 07/09/23 pending a refill request.

## 2023-07-05 ENCOUNTER — Ambulatory Visit: Payer: Medicare PPO | Admitting: Podiatry

## 2023-07-05 ENCOUNTER — Other Ambulatory Visit (HOSPITAL_COMMUNITY): Payer: Self-pay

## 2023-07-05 MED ORDER — CAPECITABINE 500 MG PO TABS
1500.0000 mg | ORAL_TABLET | Freq: Two times a day (BID) | ORAL | 5 refills | Status: DC
  Filled 2023-07-05: qty 84, 21d supply, fill #0
  Filled 2023-07-30: qty 84, 21d supply, fill #1

## 2023-07-10 ENCOUNTER — Encounter: Payer: Self-pay | Admitting: Hematology & Oncology

## 2023-07-10 ENCOUNTER — Other Ambulatory Visit: Payer: Self-pay

## 2023-07-18 ENCOUNTER — Other Ambulatory Visit (HOSPITAL_COMMUNITY): Payer: Self-pay

## 2023-07-20 ENCOUNTER — Other Ambulatory Visit: Payer: Self-pay

## 2023-07-25 ENCOUNTER — Other Ambulatory Visit: Payer: Self-pay

## 2023-07-30 ENCOUNTER — Other Ambulatory Visit (HOSPITAL_COMMUNITY): Payer: Self-pay

## 2023-07-30 ENCOUNTER — Other Ambulatory Visit: Payer: Self-pay

## 2023-07-30 NOTE — Progress Notes (Signed)
Clinical Intervention Note  Clinical Intervention Notes: recomended pretreating with Compazine to help nausea, patient's son said they would try this; dry feet, recommended Udderly Smooth on hands and feet twice a day   Clinical Intervention Outcomes: Prevention of an adverse drug event   [name]

## 2023-07-30 NOTE — Progress Notes (Signed)
Specialty Pharmacy Ongoing Clinical Assessment Note  [name] is a 89 y.o. female who is being followed by the specialty pharmacy service for RxSp Oncology   Patient's specialty medication(s) reviewed today: Capecitabine   Missed doses in the last 4 weeks: 0   Patient/Caregiver did not have any additional questions or concerns.   Therapeutic benefit summary: Patient is achieving benefit   Adverse events/side effects summary: Experienced adverse events/side effects (nausea, tolerable with Compazine- recommended pretreating; dry cracking feet, recommended Udderly Smooth twice a day)   Patient's therapy is appropriate to: Continue    Goals Addressed             This Visit's Progress    Slow Disease Progression       Patient is on track. Patient will maintain adherence.  Per Dr. Myna Hidalgo, MRI shows cancer is still shrinking.          Follow up:  3 months  Servando Snare Specialty Pharmacist

## 2023-07-30 NOTE — Progress Notes (Signed)
Specialty Pharmacy Refill Coordination Note  [name] is a 89 y.o. female contacted today regarding refills of specialty medication(s) Capecitabine   Patient requested Delivery   Delivery date: 08/03/23   Verified address: [address] [place] [address]   Medication will be filled on 08/02/23.

## 2023-08-02 ENCOUNTER — Inpatient Hospital Stay: Payer: Medicare PPO | Attending: Hematology & Oncology

## 2023-08-02 ENCOUNTER — Encounter: Payer: Self-pay | Admitting: Hematology & Oncology

## 2023-08-02 ENCOUNTER — Inpatient Hospital Stay (HOSPITAL_BASED_OUTPATIENT_CLINIC_OR_DEPARTMENT_OTHER): Payer: Medicare PPO | Admitting: Hematology & Oncology

## 2023-08-02 ENCOUNTER — Encounter: Payer: Self-pay | Admitting: *Deleted

## 2023-08-02 ENCOUNTER — Inpatient Hospital Stay: Payer: Medicare PPO

## 2023-08-02 VITALS — BP 159/63 | HR 88 | Temp 97.9°F | Resp 20 | Ht 60.0 in | Wt 155.4 lb

## 2023-08-02 DIAGNOSIS — C20 Malignant neoplasm of rectum: Secondary | ICD-10-CM | POA: Insufficient documentation

## 2023-08-02 DIAGNOSIS — Z95828 Presence of other vascular implants and grafts: Secondary | ICD-10-CM

## 2023-08-02 LAB — CMP (CANCER CENTER ONLY)
ALT: 31 U/L (ref 0–44)
AST: 26 U/L (ref 15–41)
Albumin: 4.6 g/dL (ref 3.5–5.0)
Alkaline Phosphatase: 89 U/L (ref 38–126)
Anion gap: 10 (ref 5–15)
BUN: 12 mg/dL (ref 8–23)
CO2: 29 mmol/L (ref 22–32)
Calcium: 10.2 mg/dL (ref 8.9–10.3)
Chloride: 93 mmol/L — ABNORMAL LOW (ref 98–111)
Creatinine: 0.76 mg/dL (ref 0.44–1.00)
GFR, Estimated: >60 mL/min (ref >60)
Glucose, Bld: 150 mg/dL — ABNORMAL HIGH (ref 70–99)
Potassium: 3.5 mmol/L (ref 3.5–5.1)
Sodium: 132 mmol/L — ABNORMAL LOW (ref 135–145)
Total Bilirubin: 1 mg/dL (ref <1.2)
Total Protein: 7.3 g/dL (ref 6.5–8.1)

## 2023-08-02 LAB — CBC WITH DIFFERENTIAL (CANCER CENTER ONLY)
Abs Immature Granulocytes: 0.06 10*3/uL (ref 0.00–0.07)
Basophils Absolute: 0.1 10*3/uL (ref 0.0–0.1)
Basophils Relative: 1 %
Eosinophils Absolute: 0.2 10*3/uL (ref 0.0–0.5)
Eosinophils Relative: 2 %
HCT: 40.2 % (ref 36.0–46.0)
Hemoglobin: 13.9 g/dL (ref 12.0–15.0)
Immature Granulocytes: 1 %
Lymphocytes Relative: 42 %
Lymphs Abs: 3.4 10*3/uL (ref 0.7–4.0)
MCH: 33.5 pg (ref 26.0–34.0)
MCHC: 34.6 g/dL (ref 30.0–36.0)
MCV: 96.9 fL (ref 80.0–100.0)
Monocytes Absolute: 0.9 10*3/uL (ref 0.1–1.0)
Monocytes Relative: 11 %
Neutro Abs: 3.7 10*3/uL (ref 1.7–7.7)
Neutrophils Relative %: 43 %
Platelet Count: 224 10*3/uL (ref 150–400)
RBC: 4.15 MIL/uL (ref 3.87–5.11)
RDW: 17.3 % — ABNORMAL HIGH (ref 11.5–15.5)
WBC Count: 8.3 10*3/uL (ref 4.0–10.5)
nRBC: 0 % (ref 0.0–0.2)

## 2023-08-02 LAB — CEA (IN HOUSE-CHCC): CEA (CHCC-In House): 1.93 ng/mL (ref 0.00–5.00)

## 2023-08-02 LAB — FERRITIN: Ferritin: 215 ng/mL (ref 11–307)

## 2023-08-02 MED ORDER — HEPARIN SOD (PORK) LOCK FLUSH 100 UNIT/ML IV SOLN
500.0000 [IU] | Freq: Once | INTRAVENOUS | Status: AC
Start: 2023-08-02 — End: 2023-08-02
  Administered 2023-08-02: 500 [IU] via INTRAVENOUS

## 2023-08-02 MED ORDER — CLOTRIMAZOLE 1 % EX CREA: 1.0000 | TOPICAL_CREAM | Freq: Two times a day (BID) | CUTANEOUS | 0 refills | Status: DC

## 2023-08-02 MED ORDER — SODIUM CHLORIDE 0.9% FLUSH
10.0000 mL | Freq: Once | INTRAVENOUS | Status: AC
  Administered 2023-08-02: 10 mL via INTRAVENOUS

## 2023-08-02 NOTE — Progress Notes (Addendum)
Hematology and Oncology Follow Up Visit  Jermeka R Zillmer [id number] 12/04/1928 89 y.o. 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 3 weeks-s/p cycle #1  -- start on 02/09/2023 -- d/c on 04/12/2023 Xeloda 1500 mg po BID (14/7) -- s/p cycle #6 on 02/13/2023     Interim History:  Ms. Khatun is back for follow-up.  I think that she is doing pretty well.  She looks like she does have an area of Candida in the right inguinal fold.  I sent in some antifungal cream for her.  Of note, her last CEA level was 1.4.  She had her last MRI the end of September.  This showed decreased but still residual rectal tumor.  She has had no problems with cough or shortness of breath..  She think she has done pretty well with the Xeloda.  She has had no problems with PPE.  She has had no issues with nausea or vomiting.  She is eating pretty well.  Her weight is going up.  Unfortunately, her son is having a hard time with his metastatic colon cancer.  Thankfully, she has had no problems with bleeding.  Overall, I would say that her performance status is probably ECOG 2.   Medications:  Current Outpatient Medications:    acetaminophen (TYLENOL) 325 MG tablet, Take 2 tablets (650 mg total) by mouth every 6 (six) hours as needed for mild pain (or Fever >/= 101)., Disp: 30 tablet, Rfl: 0   ALPRAZolam (XANAX) 0.5 MG tablet, Take 0.5 mg by mouth 3 (three) times daily as needed for anxiety. For anxiety, Disp: , Rfl:    amiodarone (PACERONE) 200 MG tablet, Take 100 mg by mouth daily., Disp: , Rfl:    amLODipine (NORVASC) 5 MG tablet, Take 7.5 mg by mouth., Disp: , Rfl:    capecitabine (XELODA) 500 MG tablet, Take 3 tablets (1,500 mg total) by mouth 2 (two) times daily after a meal. Take within 30 minutes after meals. Take for 14 days on, 7 days off. Repeat every 21 days., Disp: 84 tablet, Rfl: 5    cholecalciferol (VITAMIN D3) 25 MCG (1000 UNIT) tablet, Take 1,000 Units by mouth daily., Disp: , Rfl:    cyanocobalamin 1000 MCG tablet, Take 1 tablet (1,000 mcg total) by mouth daily., Disp: 30 tablet, Rfl: 0   escitalopram (LEXAPRO) 5 MG tablet, Take 5 mg by mouth at bedtime., Disp: , Rfl:    famotidine (PEPCID) 20 MG tablet, Take 20 mg by mouth daily., Disp: , Rfl:    feeding supplement (ENSURE ENLIVE / ENSURE PLUS) LIQD, Take 237 mLs by mouth 3 (three) times daily with meals. (Patient taking differently: Take 237 mLs by mouth 3 (three) times daily as needed (nutrition).), Disp: 237 mL, Rfl: 12   fluticasone (FLONASE) 50 MCG/ACT nasal spray, Place 2 sprays into both nostrils daily., Disp: 11.1 mL, Rfl: 0   levothyroxine (SYNTHROID) 175 MCG tablet, Take 175 mcg by mouth every morning., Disp: , Rfl:    lidocaine-prilocaine (EMLA) cream, Apply 1 Application topically as needed., Disp: 30 g, Rfl: 4   loratadine (CLARITIN) 10 MG tablet, Take 1 tablet (10 mg total) by mouth daily., Disp: 30 tablet, Rfl: 0   potassium chloride SA (K-DUR,KLOR-CON) 20 MEQ tablet, Take 20 mEq by mouth daily., Disp: , Rfl:    prochlorperazine (COMPAZINE) 10 MG tablet, Take 10 mg by mouth every 6 (six) hours as needed.,  Disp: , Rfl:    triamterene-hydrochlorothiazide (MAXZIDE-25) 37.5-25 MG tablet, TAKE 1 TABLET BY MOUTH EVERY DAY, Disp: 90 tablet, Rfl: 1   amLODipine (NORVASC) 5 MG tablet, Take 1.5 tablets (7.5 mg total) by mouth daily., Disp: 135 tablet, Rfl: 3   atorvastatin (LIPITOR) 20 MG tablet, Take 20 mg by mouth daily. (Patient not taking: Reported on 08/02/2023), Disp: , Rfl:    guaiFENesin-dextromethorphan (ROBITUSSIN DM) 100-10 MG/5ML syrup, Take 10 mLs by mouth every 4 (four) hours as needed for cough. (Patient not taking: Reported on 06/18/2023), Disp: 118 mL, Rfl: 0   HYDROcodone-acetaminophen (NORCO/VICODIN) 5-325 MG tablet, Take 1-2 tablets by mouth every 4 (four) hours as needed for moderate pain. (Patient  not taking: Reported on 06/18/2023), Disp: 30 tablet, Rfl: 0   methocarbamol (ROBAXIN) 500 MG tablet, Take 1 tablet (500 mg total) by mouth every 8 (eight) hours as needed for muscle spasms (abd pain). (Patient not taking: Reported on 06/18/2023), Disp: 10 tablet, Rfl: 0   ondansetron (ZOFRAN) 8 MG tablet, Take 1 tablet (8 mg total) by mouth every 8 (eight) hours as needed for nausea or vomiting. (Patient not taking: Reported on 06/18/2023), Disp: 20 tablet, Rfl: 0   pantoprazole (PROTONIX) 40 MG tablet, Take 40 mg by mouth daily. (Patient not taking: Reported on 08/02/2023), Disp: , Rfl:    polyethylene glycol (MIRALAX / GLYCOLAX) 17 g packet, Take 17 g by mouth daily as needed for mild constipation. (Patient not taking: Reported on 06/18/2023), Disp: 14 each, Rfl: 0   TRUE METRIX BLOOD GLUCOSE TEST test strip, SMARTSIG:Via Meter (Patient not taking: Reported on 06/18/2023), Disp: , Rfl:  No current facility-administered medications for this visit.  Facility-Administered Medications Ordered in Other Visits:    heparin lock flush 100 unit/mL, 500 Units, Intravenous, Once, Phelan Goers, Rose Phi, MD   sodium chloride flush (NS) 0.9 % injection 10 mL, 10 mL, Intravenous, Once, Tamiyah Moulin, Rose Phi, MD  Allergies:  Allergies  Allergen Reactions   Aggrenox [Aspirin-Dipyridamole Er] Other (See Comments)    Severe Bleeding and Stomach Pain   Prednisone Other (See Comments)    Climbs the walls    Past Medical History, Surgical history, Social history, and Family History were reviewed and updated.  Review of Systems: Review of Systems  Constitutional:  Positive for fatigue.  HENT:  Negative.    Eyes: Negative.   Respiratory: Negative.    Cardiovascular:  Positive for palpitations.  Gastrointestinal:  Positive for blood in stool and rectal pain.  Endocrine: Negative.   Genitourinary: Negative.    Musculoskeletal: Negative.   Skin: Negative.   Neurological:  Positive for light-headedness.  Hematological:  Negative.   Psychiatric/Behavioral: Negative.      Physical Exam:  height is 5' (1.524 m) and weight is 155 lb 6.4 oz (70.5 kg). Her oral temperature is 97.9 F (36.6 C). Her blood pressure is 159/63 (abnormal) and her pulse is 88. Her respiration is 20 and oxygen saturation is 94%.   Wt Readings from Last 3 Encounters:  08/02/23 155 lb 6.4 oz (70.5 kg)  06/18/23 154 lb 3.2 oz (69.9 kg)  05/08/23 148 lb (67.1 kg)    Physical Exam Vitals reviewed.  HENT:     Head: Normocephalic and atraumatic.  Eyes:     Pupils: Pupils are equal, round, and reactive to light.  Cardiovascular:     Rate and Rhythm: Normal rate and regular rhythm.     Heart sounds: Normal heart sounds.     Comments: Cardiac exam  is regular rate and rhythm.  She has no murmurs, rubs or bruits. Pulmonary:     Effort: Pulmonary effort is normal.     Breath sounds: Normal breath sounds.     Comments: She has good air movement bilaterally.  She has some crackles bilaterally.  I hear no wheezes. Abdominal:     General: Bowel sounds are normal.     Palpations: Abdomen is soft.  Musculoskeletal:        General: No tenderness or deformity. Normal range of motion.     Cervical back: Normal range of motion.  Lymphadenopathy:     Cervical: No cervical adenopathy.  Skin:    General: Skin is warm and dry.     Findings: No erythema or rash.  Neurological:     Mental Status: She is alert and oriented to person, place, and time.  Psychiatric:        Behavior: Behavior normal.        Thought Content: Thought content normal.        Judgment: Judgment normal.     Lab Results  Component Value Date   WBC 8.3 08/02/2023   HGB 13.9 08/02/2023   HCT 40.2 08/02/2023   MCV 96.9 08/02/2023   PLT 224 08/02/2023     Chemistry      Component Value Date/Time   NA 132 (L) 08/02/2023 1441   K 3.5 08/02/2023 1441   CL 93 (L) 08/02/2023 1441   CO2 29 08/02/2023 1441   BUN 12 08/02/2023 1441   CREATININE 0.76 08/02/2023 1441       Component Value Date/Time   CALCIUM 10.2 08/02/2023 1441   ALKPHOS 89 08/02/2023 1441   AST 26 08/02/2023 1441   ALT 31 08/02/2023 1441   BILITOT 1.0 08/02/2023 1441      Impression and Plan: Ms. Durban is a very charming 89 year old white female.  She has at least a locally advanced adenocarcinoma of the rectum.-I would have to say that she has had metastatic disease since the recent CT scan that she had done did not show any evidence of the pulmonary nodule.  We still have to monitor her rectal disease.  She is asymptomatic.  She does not have any rectal pain.  I do think that we probably need to get another rectal MRI.  There is always the best test for [place].  We will see about getting the rectal MRI in January.  I would like to see her back after she has the MRI.  She will continue on the Xeloda.    She does have her Port-A-Cath in.  We will continue to keep this in.   Josph Macho, MD 11/21/20243:22 PM

## 2023-08-02 NOTE — Patient Instructions (Signed)

## 2023-08-02 NOTE — Progress Notes (Signed)
BP remains elevated 159/63, instructed to monitor at home and notify PCP if it remains over 140/90, verbalized understanding.

## 2023-08-03 ENCOUNTER — Encounter: Payer: Self-pay | Admitting: Hematology & Oncology

## 2023-08-03 ENCOUNTER — Encounter: Payer: Self-pay | Admitting: *Deleted

## 2023-08-03 LAB — IRON AND IRON BINDING CAPACITY (CC-WL,HP ONLY)
Iron: 81 ug/dL (ref 28–170)
Saturation Ratios: 20 % (ref 10.4–31.8)
TIBC: 414 ug/dL (ref 250–450)
UIBC: 333 ug/dL (ref 148–442)

## 2023-08-03 NOTE — Progress Notes (Signed)
Patient is doing well on Xeloda. She will need a restaging MRI after the holiday and prior to her next provider appointment. Scheduled for 09/07/2023.  Oncology Nurse Navigator Documentation     08/02/2023    3:00 PM  Oncology Nurse Navigator Flowsheets  Navigator Follow Up Date: 09/07/2023  Navigator Follow Up Reason: Scan Review  Navigator Location [facility]  Navigator Encounter Type Appt/Treatment Plan Review  Patient Visit Type MedOnc  Treatment Phase Active Tx  Barriers/Navigation Needs Coordination of Care;Education  Interventions None Required  Acuity Level 2-Minimal Needs (1-2 Barriers Identified)  Support Groups/Services Friends and Family  Time Spent with Patient 15

## 2023-08-07 ENCOUNTER — Encounter: Payer: Self-pay | Admitting: Hematology & Oncology

## 2023-08-08 ENCOUNTER — Encounter: Payer: Self-pay | Admitting: Hematology & Oncology

## 2023-08-13 ENCOUNTER — Other Ambulatory Visit: Payer: Self-pay | Admitting: *Deleted

## 2023-08-13 DIAGNOSIS — C20 Malignant neoplasm of rectum: Secondary | ICD-10-CM

## 2023-08-16 ENCOUNTER — Encounter: Payer: Self-pay | Admitting: Hematology & Oncology

## 2023-08-20 ENCOUNTER — Other Ambulatory Visit: Payer: Self-pay | Admitting: *Deleted

## 2023-08-20 MED ORDER — LIDOCAINE-PRILOCAINE 2.5-2.5 % EX CREA: 1.0000 | TOPICAL_CREAM | CUTANEOUS | 4 refills | Status: DC | PRN

## 2023-08-20 MED ORDER — CLOTRIMAZOLE 1 % EX CREA: 1.0000 | TOPICAL_CREAM | Freq: Two times a day (BID) | CUTANEOUS | 0 refills | Status: AC

## 2023-08-20 MED ORDER — TRIAMTERENE-HCTZ 37.5-25 MG PO TABS: 1.0000 | ORAL_TABLET | Freq: Every day | ORAL | 1 refills | Status: DC

## 2023-08-21 ENCOUNTER — Other Ambulatory Visit: Payer: Self-pay | Admitting: *Deleted

## 2023-08-21 ENCOUNTER — Other Ambulatory Visit: Payer: Self-pay

## 2023-08-21 MED ORDER — AMIODARONE HCL 200 MG PO TABS: 100.0000 mg | ORAL_TABLET | Freq: Every day | ORAL | 2 refills | Status: DC

## 2023-08-23 DIAGNOSIS — E039 Hypothyroidism, unspecified: Secondary | ICD-10-CM | POA: Diagnosis not present

## 2023-08-24 ENCOUNTER — Other Ambulatory Visit: Payer: Self-pay

## 2023-08-27 ENCOUNTER — Other Ambulatory Visit: Payer: Self-pay

## 2023-08-27 NOTE — Progress Notes (Signed)
Spoke with patient's son, [name] regarding Specialty refill for Xeloda. Patient is currently on hold and will see prescriber on Friday, 12/20. Pharmacy will follow-up with [name] after appointment for Xeloda.

## 2023-08-31 ENCOUNTER — Inpatient Hospital Stay: Payer: Medicare PPO | Attending: Hematology & Oncology

## 2023-08-31 ENCOUNTER — Inpatient Hospital Stay: Payer: Medicare PPO

## 2023-08-31 ENCOUNTER — Inpatient Hospital Stay (HOSPITAL_BASED_OUTPATIENT_CLINIC_OR_DEPARTMENT_OTHER): Payer: Medicare PPO | Admitting: Hematology & Oncology

## 2023-08-31 VITALS — BP 153/69 | HR 100 | Temp 97.8°F | Resp 17 | Ht 60.0 in | Wt 157.0 lb

## 2023-08-31 DIAGNOSIS — Z95828 Presence of other vascular implants and grafts: Secondary | ICD-10-CM

## 2023-08-31 DIAGNOSIS — C20 Malignant neoplasm of rectum: Secondary | ICD-10-CM | POA: Insufficient documentation

## 2023-08-31 LAB — CBC WITH DIFFERENTIAL (CANCER CENTER ONLY)
Abs Immature Granulocytes: 0.04 10*3/uL (ref 0.00–0.07)
Basophils Absolute: 0.1 10*3/uL (ref 0.0–0.1)
Basophils Relative: 1 %
Eosinophils Absolute: 0.1 10*3/uL (ref 0.0–0.5)
Eosinophils Relative: 1 %
HCT: 43.1 % (ref 36.0–46.0)
Hemoglobin: 14.8 g/dL (ref 12.0–15.0)
Immature Granulocytes: 0 %
Lymphocytes Relative: 35 %
Lymphs Abs: 3.5 10*3/uL (ref 0.7–4.0)
MCH: 33.3 pg (ref 26.0–34.0)
MCHC: 34.3 g/dL (ref 30.0–36.0)
MCV: 96.9 fL (ref 80.0–100.0)
Monocytes Absolute: 1.2 10*3/uL — ABNORMAL HIGH (ref 0.1–1.0)
Monocytes Relative: 12 %
Neutro Abs: 5 10*3/uL (ref 1.7–7.7)
Neutrophils Relative %: 51 %
Platelet Count: 223 10*3/uL (ref 150–400)
RBC: 4.45 MIL/uL (ref 3.87–5.11)
RDW: 15.2 % (ref 11.5–15.5)
WBC Count: 9.9 10*3/uL (ref 4.0–10.5)
nRBC: 0 % (ref 0.0–0.2)

## 2023-08-31 LAB — CMP (CANCER CENTER ONLY)
ALT: 21 U/L (ref 0–44)
AST: 18 U/L (ref 15–41)
Albumin: 4.5 g/dL (ref 3.5–5.0)
Alkaline Phosphatase: 92 U/L (ref 38–126)
Anion gap: 11 (ref 5–15)
BUN: 14 mg/dL (ref 8–23)
CO2: 29 mmol/L (ref 22–32)
Calcium: 9.4 mg/dL (ref 8.9–10.3)
Chloride: 96 mmol/L — ABNORMAL LOW (ref 98–111)
Creatinine: 0.72 mg/dL (ref 0.44–1.00)
GFR, Estimated: >60 mL/min (ref >60)
Glucose, Bld: 130 mg/dL — ABNORMAL HIGH (ref 70–99)
Potassium: 3.4 mmol/L — ABNORMAL LOW (ref 3.5–5.1)
Sodium: 136 mmol/L (ref 135–145)
Total Bilirubin: 0.8 mg/dL (ref <1.2)
Total Protein: 7.2 g/dL (ref 6.5–8.1)

## 2023-08-31 LAB — FERRITIN: Ferritin: 150 ng/mL (ref 11–307)

## 2023-08-31 MED ORDER — SODIUM CHLORIDE 0.9% FLUSH
10.0000 mL | INTRAVENOUS | Status: DC | PRN
  Administered 2023-08-31: 10 mL via INTRAVENOUS

## 2023-08-31 MED ORDER — HEPARIN SOD (PORK) LOCK FLUSH 100 UNIT/ML IV SOLN
500.0000 [IU] | Freq: Once | INTRAVENOUS | Status: AC
  Administered 2023-08-31: 500 [IU] via INTRAVENOUS

## 2023-08-31 NOTE — Progress Notes (Signed)
Hematology and Oncology Follow Up Visit  [name] [id number] 1929-03-25 89 y.o. 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 3 weeks-s/p cycle #1  -- start on 02/09/2023 -- d/c on 04/12/2023 Xeloda 1500 mg po BID (14/7) -- s/p cycle #6 on 02/13/2023 -- changed to 1000mg  po BID on 08/31/2023     Interim History:  [name] is back for follow-up.  She was had a tough time with the Xeloda with respect to dermatologic toxicity.  As such, we are going to have to decrease the Xeloda down to 1000 mg p.o. twice daily.  She still has some erythema on the soles of her feet.  Her hands do not look as bad.  Otherwise, she is doing okay.  She is gaining weight.  She is eating well.  She has had no problems with cough.  She is not using any oxygen.  Her last CEA level was 1.93.  I think she is due for a MRI in about a week or so.  She has had no problems with bowels or bladder.  There is no rectal pain.  There is no rectal bleeding.  She has had no problems with headache.  There is been no mouth sores.  Her last iron studies back in November showed a ferritin of 215 with an iron saturation of 20%.  Overall, I would have to say that her her performance status is probably ECOG 1.   Medications:  Current Outpatient Medications:    acetaminophen (TYLENOL) 325 MG tablet, Take 2 tablets (650 mg total) by mouth every 6 (six) hours as needed for mild pain (or Fever >/= 101)., Disp: 30 tablet, Rfl: 0   ALPRAZolam (XANAX) 0.5 MG tablet, Take 0.5 mg by mouth 3 (three) times daily as needed for anxiety. For anxiety, Disp: , Rfl:    amiodarone (PACERONE) 200 MG tablet, Take 0.5 tablets (100 mg total) by mouth daily., Disp: 45 tablet, Rfl: 2   amLODipine (NORVASC) 5 MG tablet, Take 7.5 mg by mouth., Disp: , Rfl:    capecitabine (XELODA) 500 MG tablet, Take 3 tablets (1,500 mg total) by mouth  2 (two) times daily after a meal. Take within 30 minutes after meals. Take for 14 days on, 7 days off. Repeat every 21 days., Disp: 84 tablet, Rfl: 5   cholecalciferol (VITAMIN D3) 25 MCG (1000 UNIT) tablet, Take 1,000 Units by mouth daily., Disp: , Rfl:    clotrimazole (LOTRIMIN) 1 % cream, Apply 1 Application topically 2 (two) times daily., Disp: 30 g, Rfl: 0   cyanocobalamin 1000 MCG tablet, Take 1 tablet (1,000 mcg total) by mouth daily., Disp: 30 tablet, Rfl: 0   escitalopram (LEXAPRO) 5 MG tablet, Take 5 mg by mouth at bedtime., Disp: , Rfl:    famotidine (PEPCID) 20 MG tablet, Take 20 mg by mouth daily., Disp: , Rfl:    feeding supplement (ENSURE ENLIVE / ENSURE PLUS) LIQD, Take 237 mLs by mouth 3 (three) times daily with meals. (Patient taking differently: Take 237 mLs by mouth 3 (three) times daily as needed (nutrition).), Disp: 237 mL, Rfl: 12   fluticasone (FLONASE) 50 MCG/ACT nasal spray, Place 2 sprays into both nostrils daily., Disp: 11.1 mL, Rfl: 0   guaiFENesin-dextromethorphan (ROBITUSSIN DM) 100-10 MG/5ML syrup, Take 10 mLs by mouth every 4 (four) hours as needed for cough., Disp: 118 mL, Rfl: 0   HYDROcodone-acetaminophen (NORCO/VICODIN) 5-325 MG  tablet, Take 1-2 tablets by mouth every 4 (four) hours as needed for moderate pain., Disp: 30 tablet, Rfl: 0   levothyroxine (SYNTHROID) 175 MCG tablet, Take 175 mcg by mouth every morning., Disp: , Rfl:    lidocaine-prilocaine (EMLA) cream, Apply 1 Application topically as needed., Disp: 30 g, Rfl: 4   loratadine (CLARITIN) 10 MG tablet, Take 1 tablet (10 mg total) by mouth daily., Disp: 30 tablet, Rfl: 0   methocarbamol (ROBAXIN) 500 MG tablet, Take 1 tablet (500 mg total) by mouth every 8 (eight) hours as needed for muscle spasms (abd pain)., Disp: 10 tablet, Rfl: 0   ondansetron (ZOFRAN) 8 MG tablet, Take 1 tablet (8 mg total) by mouth every 8 (eight) hours as needed for nausea or vomiting., Disp: 20 tablet, Rfl: 0   pantoprazole  (PROTONIX) 40 MG tablet, Take 40 mg by mouth daily., Disp: , Rfl:    polyethylene glycol (MIRALAX / GLYCOLAX) 17 g packet, Take 17 g by mouth daily as needed for mild constipation., Disp: 14 each, Rfl: 0   potassium chloride SA (K-DUR,KLOR-CON) 20 MEQ tablet, Take 20 mEq by mouth daily., Disp: , Rfl:    prochlorperazine (COMPAZINE) 10 MG tablet, Take 10 mg by mouth every 6 (six) hours as needed., Disp: , Rfl:    triamterene-hydrochlorothiazide (MAXZIDE-25) 37.5-25 MG tablet, Take 1 tablet by mouth daily., Disp: 90 tablet, Rfl: 1   TRUE METRIX BLOOD GLUCOSE TEST test strip, , Disp: , Rfl:    amLODipine (NORVASC) 5 MG tablet, Take 1.5 tablets (7.5 mg total) by mouth daily., Disp: 135 tablet, Rfl: 3   atorvastatin (LIPITOR) 20 MG tablet, Take 20 mg by mouth daily. (Patient not taking: Reported on 08/31/2023), Disp: , Rfl:   Allergies:  Allergies  Allergen Reactions   Aggrenox [Aspirin-Dipyridamole Er] Other (See Comments)    Severe Bleeding and Stomach Pain   Prednisone Other (See Comments)    Climbs the walls    Past Medical History, Surgical history, Social history, and Family History were reviewed and updated.  Review of Systems: Review of Systems  Constitutional:  Positive for fatigue.  HENT:  Negative.    Eyes: Negative.   Respiratory: Negative.    Cardiovascular:  Positive for palpitations.  Gastrointestinal:  Positive for blood in stool and rectal pain.  Endocrine: Negative.   Genitourinary: Negative.    Musculoskeletal: Negative.   Skin: Negative.   Neurological:  Positive for light-headedness.  Hematological: Negative.   Psychiatric/Behavioral: Negative.      Physical Exam:  height is 5' (1.524 m) and weight is 157 lb (71.2 kg). Her oral temperature is 97.8 F (36.6 C). Her blood pressure is 153/69 (abnormal) and her pulse is 100. Her respiration is 17 and oxygen saturation is 95%.   Wt Readings from Last 3 Encounters:  08/31/23 157 lb (71.2 kg)  08/02/23 155 lb 6.4  oz (70.5 kg)  06/18/23 154 lb 3.2 oz (69.9 kg)    Physical Exam Vitals reviewed.  HENT:     Head: Normocephalic and atraumatic.  Eyes:     Pupils: Pupils are equal, round, and reactive to light.  Cardiovascular:     Rate and Rhythm: Normal rate and regular rhythm.     Heart sounds: Normal heart sounds.     Comments: Cardiac exam is regular rate and rhythm.  She has no murmurs, rubs or bruits. Pulmonary:     Effort: Pulmonary effort is normal.     Breath sounds: Normal breath sounds.  Comments: She has good air movement bilaterally.  She has some crackles bilaterally.  I hear no wheezes. Abdominal:     General: Bowel sounds are normal.     Palpations: Abdomen is soft.  Musculoskeletal:        General: No tenderness or deformity. Normal range of motion.     Cervical back: Normal range of motion.  Lymphadenopathy:     Cervical: No cervical adenopathy.  Skin:    General: Skin is warm and dry.     Findings: No erythema or rash.  Neurological:     Mental Status: She is alert and oriented to person, place, and time.  Psychiatric:        Behavior: Behavior normal.        Thought Content: Thought content normal.        Judgment: Judgment normal.     Lab Results  Component Value Date   WBC 9.9 08/31/2023   HGB 14.8 08/31/2023   HCT 43.1 08/31/2023   MCV 96.9 08/31/2023   PLT 223 08/31/2023     Chemistry      Component Value Date/Time   NA 136 08/31/2023 1510   K 3.4 (L) 08/31/2023 1510   CL 96 (L) 08/31/2023 1510   CO2 29 08/31/2023 1510   BUN 14 08/31/2023 1510   CREATININE 0.72 08/31/2023 1510      Component Value Date/Time   CALCIUM 9.4 08/31/2023 1510   ALKPHOS 92 08/31/2023 1510   AST 18 08/31/2023 1510   ALT 21 08/31/2023 1510   BILITOT 0.8 08/31/2023 1510      Impression and Plan: [name] is a very charming 89 year old white female.  She has at least a locally advanced adenocarcinoma of the rectum.-I would have to say that she has had metastatic  disease since the recent CT scan that she had done did not show any evidence of the pulmonary nodule.  We will have to see what the MRI of the rectum shows.  This to be done in late, late December.  I told her to restart the Xeloda after she has the MRI.  Again we will go down to 1000 mg p.o. twice daily.  I am just happy that her quality of life is doing well.  She was had a tough year.  I would like to hope that next year will be nice and quite for her.  I will plan to see her back in January.   Josph Macho, MD 12/20/20245:26 PM

## 2023-09-03 ENCOUNTER — Encounter: Payer: Self-pay | Admitting: *Deleted

## 2023-09-03 LAB — IRON AND IRON BINDING CAPACITY (CC-WL,HP ONLY)
Iron: 49 ug/dL (ref 28–170)
Saturation Ratios: 12 % (ref 10.4–31.8)
TIBC: 402 ug/dL (ref 250–450)
UIBC: 353 ug/dL (ref 148–442)

## 2023-09-06 ENCOUNTER — Telehealth: Payer: Self-pay

## 2023-09-06 NOTE — Telephone Encounter (Signed)
Received phone call from patient son stating that patient has "the virus we all had and is coughing a lot" Pt son inquiring about if patient can't lay flat for her MRI scheduled 09/07/2023 what he should do about her restarting her oral chemotherapy. Discussed pt son concerns with Dr. Myna Hidalgo. Pt son educated that if he needs to reschedule the MRI that the patient should restart oral chemotherapy in one week. Pt son also educated that if he feels his mom needs to be seen for "cough and virus" to take her to closest urgent care or follow up with her PCP. Pt son given the number for central scheduling.  Pt son had no further questions or concerns. Pt son appreciative of call.

## 2023-09-07 ENCOUNTER — Ambulatory Visit (HOSPITAL_COMMUNITY): Payer: Medicare PPO

## 2023-09-07 ENCOUNTER — Other Ambulatory Visit: Payer: Self-pay

## 2023-09-10 ENCOUNTER — Encounter: Payer: Self-pay | Admitting: Hematology & Oncology

## 2023-09-10 ENCOUNTER — Encounter: Payer: Self-pay | Admitting: Cardiovascular Disease

## 2023-09-11 ENCOUNTER — Telehealth: Payer: Self-pay | Admitting: *Deleted

## 2023-09-11 ENCOUNTER — Telehealth: Payer: Self-pay | Admitting: Hematology & Oncology

## 2023-09-11 NOTE — Telephone Encounter (Signed)
 Called to schedule pt for IV Iron per inbasket. LVM to return call for scheduling.

## 2023-09-11 NOTE — Telephone Encounter (Signed)
 Son Butler called to ask about the Xeloda  and when to restart.  Dr Timmy not here but read last note which said that  Dr E told her to restart the Xeloda  after she has the MRI.  Again we will go down to 1000 mg p.o. twice daily  Butler understands instructions

## 2023-09-13 ENCOUNTER — Ambulatory Visit (HOSPITAL_COMMUNITY)
Admission: RE | Admit: 2023-09-13 | Discharge: 2023-09-13 | Disposition: A | Discharge status: Home / Self Care | Payer: Medicare Other | Source: Ambulatory Visit | Attending: Hematology & Oncology | Admitting: Hematology & Oncology

## 2023-09-13 ENCOUNTER — Encounter: Payer: Self-pay | Admitting: *Deleted

## 2023-09-13 DIAGNOSIS — C20 Malignant neoplasm of rectum: Secondary | ICD-10-CM | POA: Insufficient documentation

## 2023-09-13 NOTE — Progress Notes (Signed)
 MRI shows stable disease.   Oncology Nurse Navigator Documentation     09/13/2023    3:15 PM  Oncology Nurse Navigator Flowsheets  Navigator Follow Up Date: 09/20/2023  Navigator Follow Up Reason: Follow-up Appointment;Chemotherapy  Navigator Location [facility]  Navigator Encounter Type Scan Review  Patient Visit Type MedOnc  Treatment Phase Active Tx  Barriers/Navigation Needs Coordination of Care;Education  Interventions None Required  Acuity Level 2-Minimal Needs (1-2 Barriers Identified)  Support Groups/Services Friends and Family  Time Spent with Patient 15

## 2023-09-17 ENCOUNTER — Encounter: Payer: Self-pay | Admitting: Hematology & Oncology

## 2023-09-20 ENCOUNTER — Inpatient Hospital Stay: Payer: 59

## 2023-09-20 ENCOUNTER — Other Ambulatory Visit: Payer: Medicare PPO

## 2023-09-20 ENCOUNTER — Encounter: Payer: Self-pay | Admitting: Hematology & Oncology

## 2023-09-20 ENCOUNTER — Encounter: Payer: Self-pay | Admitting: *Deleted

## 2023-09-20 ENCOUNTER — Inpatient Hospital Stay: Payer: 59 | Attending: Hematology & Oncology | Admitting: Hematology & Oncology

## 2023-09-20 VITALS — BP 181/67

## 2023-09-20 VITALS — BP 155/69 | HR 82

## 2023-09-20 VITALS — BP 187/64 | HR 93 | Temp 97.7°F | Resp 18

## 2023-09-20 DIAGNOSIS — D500 Iron deficiency anemia secondary to blood loss (chronic): Secondary | ICD-10-CM

## 2023-09-20 DIAGNOSIS — Z79890 Hormone replacement therapy (postmenopausal): Secondary | ICD-10-CM | POA: Diagnosis not present

## 2023-09-20 DIAGNOSIS — C20 Malignant neoplasm of rectum: Secondary | ICD-10-CM | POA: Insufficient documentation

## 2023-09-20 DIAGNOSIS — R911 Solitary pulmonary nodule: Secondary | ICD-10-CM | POA: Insufficient documentation

## 2023-09-20 DIAGNOSIS — Z79899 Other long term (current) drug therapy: Secondary | ICD-10-CM | POA: Insufficient documentation

## 2023-09-20 DIAGNOSIS — Z95828 Presence of other vascular implants and grafts: Secondary | ICD-10-CM

## 2023-09-20 LAB — CMP (CANCER CENTER ONLY)
ALT: 28 U/L (ref 0–44)
AST: 24 U/L (ref 15–41)
Albumin: 4.3 g/dL (ref 3.5–5.0)
Alkaline Phosphatase: 81 U/L (ref 38–126)
Anion gap: 11 (ref 5–15)
BUN: 15 mg/dL (ref 8–23)
CO2: 27 mmol/L (ref 22–32)
Calcium: 9.5 mg/dL (ref 8.9–10.3)
Chloride: 99 mmol/L (ref 98–111)
Creatinine: 0.85 mg/dL (ref 0.44–1.00)
GFR, Estimated: >60 mL/min (ref >60)
Glucose, Bld: 135 mg/dL — ABNORMAL HIGH (ref 70–99)
Potassium: 3.7 mmol/L (ref 3.5–5.1)
Sodium: 137 mmol/L (ref 135–145)
Total Bilirubin: 0.6 mg/dL (ref 0.0–1.2)
Total Protein: 7 g/dL (ref 6.5–8.1)

## 2023-09-20 LAB — CBC WITH DIFFERENTIAL (CANCER CENTER ONLY)
Abs Immature Granulocytes: 0.03 10*3/uL (ref 0.00–0.07)
Basophils Absolute: 0.1 10*3/uL (ref 0.0–0.1)
Basophils Relative: 1 %
Eosinophils Absolute: 0.1 10*3/uL (ref 0.0–0.5)
Eosinophils Relative: 2 %
HCT: 43.9 % (ref 36.0–46.0)
Hemoglobin: 15.2 g/dL — ABNORMAL HIGH (ref 12.0–15.0)
Immature Granulocytes: 0 %
Lymphocytes Relative: 38 %
Lymphs Abs: 3.1 10*3/uL (ref 0.7–4.0)
MCH: 32.8 pg (ref 26.0–34.0)
MCHC: 34.6 g/dL (ref 30.0–36.0)
MCV: 94.8 fL (ref 80.0–100.0)
Monocytes Absolute: 0.7 10*3/uL (ref 0.1–1.0)
Monocytes Relative: 8 %
Neutro Abs: 4.1 10*3/uL (ref 1.7–7.7)
Neutrophils Relative %: 51 %
Platelet Count: 228 10*3/uL (ref 150–400)
RBC: 4.63 MIL/uL (ref 3.87–5.11)
RDW: 14.3 % (ref 11.5–15.5)
WBC Count: 8.1 10*3/uL (ref 4.0–10.5)
nRBC: 0 % (ref 0.0–0.2)

## 2023-09-20 LAB — FERRITIN: Ferritin: 134 ng/mL (ref 11–307)

## 2023-09-20 MED ORDER — HEPARIN SOD (PORK) LOCK FLUSH 100 UNIT/ML IV SOLN
500.0000 [IU] | Freq: Once | INTRAVENOUS | Status: AC
  Administered 2023-09-20: 500 [IU] via INTRAVENOUS

## 2023-09-20 MED ORDER — PROCHLORPERAZINE MALEATE 10 MG PO TABS: 10.0000 mg | ORAL_TABLET | Freq: Four times a day (QID) | ORAL | 1 refills | Status: DC | PRN

## 2023-09-20 MED ORDER — SODIUM CHLORIDE 0.9% FLUSH
10.0000 mL | Freq: Once | INTRAVENOUS | Status: AC
  Administered 2023-09-20: 10 mL via INTRAVENOUS

## 2023-09-20 MED ORDER — SODIUM CHLORIDE 0.9 % IV SOLN: Freq: Once | INTRAVENOUS | Status: AC

## 2023-09-20 MED ORDER — SODIUM CHLORIDE 0.9 % IV SOLN
510.0000 mg | Freq: Once | INTRAVENOUS | Status: AC
  Administered 2023-09-20: 510 mg via INTRAVENOUS
  Filled 2023-09-20: qty 17

## 2023-09-20 NOTE — Progress Notes (Signed)
 Patient is doing well off the Xeloda . Recent scans show good treatment response. She will continue treatment holiday.   Oncology Nurse Navigator Documentation     09/20/2023    2:00 PM  Oncology Nurse Navigator Flowsheets  Navigator Follow Up Date: 10/19/2023  Navigator Follow Up Reason: Follow-up Appointment  Navigator Location [facility]  Navigator Encounter Type Follow-up Appt  Patient Visit Type MedOnc  Treatment Phase Active Tx  Barriers/Navigation Needs Coordination of Care;Education  Interventions Psycho-Social Support  Acuity Level 2-Minimal Needs (1-2 Barriers Identified)  Support Groups/Services Friends and Family  Time Spent with Patient 15

## 2023-09-20 NOTE — Progress Notes (Signed)
 Hematology and Oncology Follow Up Visit  [name] [id number] 1928/09/16 89 y.o. 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 3 weeks-s/p cycle #1  -- start on 02/09/2023 -- d/c on 04/12/2023 Xeloda  1500 mg po BID (14/7) -- s/p cycle #6 on 02/13/2023 -- changed to 1000mg  po BID on 08/31/2023 --on hold since 08/2023      Interim History:  [name] is back for follow-up.  She is doing better.  She is getting better with respect to the PPE on her hands and feet.  She still has quite a bit of erythema on the bottom of her feet.  There is no peeling.  We did do an MRI of the pelvis.  This was done on 09/13/2023.  Everything looks stable.  The rectal lesion was 10 mm in thickness.  It measured 2.5 cm.  This was relatively stable.  Her last CEA level was also normal at 1.93.  She has had no problems with cough.  There is no nausea or vomiting.  She has had no bleeding.  There is been no melena or bright red blood per rectum.  She has had no pain in the rectum.  Her iron saturation is only 12%.  I will add to give her a dose of iron today.  Overall, I would have said that her performance status is ECOG 1.   Medications:  Current Outpatient Medications:    acetaminophen  (TYLENOL ) 325 MG tablet, Take 2 tablets (650 mg total) by mouth every 6 (six) hours as needed for mild pain (or Fever >/= 101)., Disp: 30 tablet, Rfl: 0   ALPRAZolam  (XANAX ) 0.5 MG tablet, Take 0.5 mg by mouth 3 (three) times daily as needed for anxiety. For anxiety, Disp: , Rfl:    amiodarone  (PACERONE ) 200 MG tablet, Take 0.5 tablets (100 mg total) by mouth daily., Disp: 45 tablet, Rfl: 2   amLODipine  (NORVASC ) 5 MG tablet, Take 7.5 mg by mouth., Disp: , Rfl:    atorvastatin  (LIPITOR) 20 MG tablet, Take 20 mg by mouth daily., Disp: , Rfl:    cholecalciferol (VITAMIN D3) 25 MCG (1000 UNIT) tablet, Take 1,000  Units by mouth daily., Disp: , Rfl:    cyanocobalamin  1000 MCG tablet, Take 1 tablet (1,000 mcg total) by mouth daily., Disp: 30 tablet, Rfl: 0   escitalopram  (LEXAPRO ) 5 MG tablet, Take 5 mg by mouth at bedtime., Disp: , Rfl:    famotidine  (PEPCID ) 20 MG tablet, Take 20 mg by mouth daily., Disp: , Rfl:    feeding supplement (ENSURE ENLIVE / ENSURE PLUS) LIQD, Take 237 mLs by mouth 3 (three) times daily with meals. (Patient taking differently: Take 237 mLs by mouth 3 (three) times daily as needed (nutrition).), Disp: 237 mL, Rfl: 12   levothyroxine  (SYNTHROID ) 175 MCG tablet, Take 175 mcg by mouth every morning., Disp: , Rfl:    lidocaine -prilocaine  (EMLA ) cream, Apply 1 Application topically as needed., Disp: 30 g, Rfl: 4   loratadine  (CLARITIN ) 10 MG tablet, Take 1 tablet (10 mg total) by mouth daily., Disp: 30 tablet, Rfl: 0   pantoprazole  (PROTONIX ) 40 MG tablet, Take 40 mg by mouth daily., Disp: , Rfl:    polyethylene glycol (MIRALAX  / GLYCOLAX ) 17 g packet, Take 17 g by mouth daily as needed for mild constipation., Disp: 14 each, Rfl: 0   potassium chloride  SA (K-DUR,KLOR-CON ) 20 MEQ tablet, Take 20 mEq by mouth  daily., Disp: , Rfl:    triamterene -hydrochlorothiazide  (MAXZIDE-25) 37.5-25 MG tablet, Take 1 tablet by mouth daily., Disp: 90 tablet, Rfl: 1   amLODipine  (NORVASC ) 5 MG tablet, Take 1.5 tablets (7.5 mg total) by mouth daily., Disp: 135 tablet, Rfl: 3   capecitabine  (XELODA ) 500 MG tablet, Take 3 tablets (1,500 mg total) by mouth 2 (two) times daily after a meal. Take within 30 minutes after meals. Take for 14 days on, 7 days off. Repeat every 21 days. (Patient not taking: Reported on 09/20/2023), Disp: 84 tablet, Rfl: 5   clotrimazole  (LOTRIMIN ) 1 % cream, Apply 1 Application topically 2 (two) times daily. (Patient not taking: Reported on 09/20/2023), Disp: 30 g, Rfl: 0   fluticasone  (FLONASE ) 50 MCG/ACT nasal spray, Place 2 sprays into both nostrils daily. (Patient not taking: Reported on  09/20/2023), Disp: 11.1 mL, Rfl: 0   guaiFENesin -dextromethorphan  (ROBITUSSIN DM) 100-10 MG/5ML syrup, Take 10 mLs by mouth every 4 (four) hours as needed for cough. (Patient not taking: Reported on 09/20/2023), Disp: 118 mL, Rfl: 0   HYDROcodone -acetaminophen  (NORCO/VICODIN) 5-325 MG tablet, Take 1-2 tablets by mouth every 4 (four) hours as needed for moderate pain. (Patient not taking: Reported on 09/20/2023), Disp: 30 tablet, Rfl: 0   methocarbamol  (ROBAXIN ) 500 MG tablet, Take 1 tablet (500 mg total) by mouth every 8 (eight) hours as needed for muscle spasms (abd pain). (Patient not taking: Reported on 09/20/2023), Disp: 10 tablet, Rfl: 0   prochlorperazine  (COMPAZINE ) 10 MG tablet, Take 1 tablet (10 mg total) by mouth every 6 (six) hours as needed., Disp: 30 tablet, Rfl: 1   TRUE METRIX BLOOD GLUCOSE TEST test strip, , Disp: , Rfl:  No current facility-administered medications for this visit.  Facility-Administered Medications Ordered in Other Visits:    ferumoxytol  (FERAHEME ) 510 mg in sodium chloride  0.9 % 100 mL IVPB, 510 mg, Intravenous, Once, [place], Sarah M, PA-C  Allergies:  Allergies  Allergen Reactions   Aggrenox  [Aspirin -Dipyridamole  Er] Other (See Comments)    Severe Bleeding and Stomach Pain   Prednisone  Other (See Comments)    Climbs the walls    Past Medical History, Surgical history, Social history, and Family History were reviewed and updated.  Review of Systems: Review of Systems  Constitutional:  Positive for fatigue.  HENT:  Negative.    Eyes: Negative.   Respiratory: Negative.    Cardiovascular:  Positive for palpitations.  Gastrointestinal:  Positive for blood in stool and rectal pain.  Endocrine: Negative.   Genitourinary: Negative.    Musculoskeletal: Negative.   Skin: Negative.   Neurological:  Positive for light-headedness.  Hematological: Negative.   Psychiatric/Behavioral: Negative.      Physical Exam: Her vital signs show temperature of 97.7.  Pulse  89.  Blood pressure 181/67.  Weight is 156 pounds.   Wt Readings from Last 3 Encounters:  08/31/23 157 lb (71.2 kg)  08/02/23 155 lb 6.4 oz (70.5 kg)  06/18/23 154 lb 3.2 oz (69.9 kg)    Physical Exam Vitals reviewed.  HENT:     Head: Normocephalic and atraumatic.  Eyes:     Pupils: Pupils are equal, round, and reactive to light.  Cardiovascular:     Rate and Rhythm: Normal rate and regular rhythm.     Heart sounds: Normal heart sounds.     Comments: Cardiac exam is regular rate and rhythm.  She has no murmurs, rubs or bruits. Pulmonary:     Effort: Pulmonary effort is normal.     Breath  sounds: Normal breath sounds.     Comments: She has good air movement bilaterally.  She has some crackles bilaterally.  I hear no wheezes. Abdominal:     General: Bowel sounds are normal.     Palpations: Abdomen is soft.  Musculoskeletal:        General: No tenderness or deformity. Normal range of motion.     Cervical back: Normal range of motion.  Lymphadenopathy:     Cervical: No cervical adenopathy.  Skin:    General: Skin is warm and dry.     Findings: No erythema or rash.  Neurological:     Mental Status: She is alert and oriented to person, place, and time.  Psychiatric:        Behavior: Behavior normal.        Thought Content: Thought content normal.        Judgment: Judgment normal.     Lab Results  Component Value Date   WBC 8.1 09/20/2023   HGB 15.2 (H) 09/20/2023   HCT 43.9 09/20/2023   MCV 94.8 09/20/2023   PLT 228 09/20/2023     Chemistry      Component Value Date/Time   NA 137 09/20/2023 1315   K 3.7 09/20/2023 1315   CL 99 09/20/2023 1315   CO2 27 09/20/2023 1315   BUN 15 09/20/2023 1315   CREATININE 0.85 09/20/2023 1315      Component Value Date/Time   CALCIUM  9.5 09/20/2023 1315   ALKPHOS 81 09/20/2023 1315   AST 24 09/20/2023 1315   ALT 28 09/20/2023 1315   BILITOT 0.6 09/20/2023 1315      Impression and Plan: Ms. Hofbauer is a very charming  89 year old white female.  She has at least a locally advanced adenocarcinoma of the rectum.-I would have to say that she has had metastatic disease since the recent CT scan that she had done did not show any evidence of the pulmonary nodule.  Again, the MRI shows everything looks stable.  I still like to keep her off the Xeloda .  I really do not see this as a problem.  We probably would repeat another MRI sometime in February or early March.  I will plan to get her back in another month.  I think this would be very reasonable.   Maude JONELLE Crease, MD 1/9/20252:03 PM

## 2023-09-20 NOTE — Patient Instructions (Signed)
 Iron Sucrose Injection What is this medication? IRON SUCROSE (EYE ern SOO krose) treats low levels of iron (iron deficiency anemia) in people with kidney disease. Iron is a mineral that plays an important role in making red blood cells, which carry oxygen from your lungs to the rest of your body. This medicine may be used for other purposes; ask your health care provider or pharmacist if you have questions. COMMON BRAND NAME(S): Venofer What should I tell my care team before I take this medication? They need to know if you have any of these conditions: Anemia not caused by low iron levels Heart disease High levels of iron in the blood Kidney disease Liver disease An unusual or allergic reaction to iron, other medications, foods, dyes, or preservatives Pregnant or trying to get pregnant Breastfeeding How should I use this medication? This medication is for infusion into a vein. It is given in a hospital or clinic setting. Talk to your care team about the use of this medication in children. While this medication may be prescribed for children as young as 2 years for selected conditions, precautions do apply. Overdosage: If you think you have taken too much of this medicine contact a poison control center or emergency room at once. NOTE: This medicine is only for you. Do not share this medicine with others. What if I miss a dose? Keep appointments for follow-up doses. It is important not to miss your dose. Call your care team if you are unable to keep an appointment. What may interact with this medication? Do not take this medication with any of the following: Deferoxamine Dimercaprol Other iron products This medication may also interact with the following: Chloramphenicol Deferasirox This list may not describe all possible interactions. Give your health care provider a list of all the medicines, herbs, non-prescription drugs, or dietary supplements you use. Also tell them if you smoke,  drink alcohol, or use illegal drugs. Some items may interact with your medicine. What should I watch for while using this medication? Visit your care team regularly. Tell your care team if your symptoms do not start to get better or if they get worse. You may need blood work done while you are taking this medication. You may need to follow a special diet. Talk to your care team. Foods that contain iron include: whole grains/cereals, dried fruits, beans, or peas, leafy green vegetables, and organ meats (liver, kidney). What side effects may I notice from receiving this medication? Side effects that you should report to your care team as soon as possible: Allergic reactions--skin rash, itching, hives, swelling of the face, lips, tongue, or throat Low blood pressure--dizziness, feeling faint or lightheaded, blurry vision Shortness of breath Side effects that usually do not require medical attention (report to your care team if they continue or are bothersome): Flushing Headache Joint pain Muscle pain Nausea Pain, redness, or irritation at injection site This list may not describe all possible side effects. Call your doctor for medical advice about side effects. You may report side effects to FDA at 1-800-FDA-1088. Where should I keep my medication? This medication is given in a hospital or clinic. It will not be stored at home. NOTE: This sheet is a summary. It may not cover all possible information. If you have questions about this medicine, talk to your doctor, pharmacist, or health care provider.  2024 Elsevier/Gold Standard (2023-02-02 00:00:00)

## 2023-09-21 LAB — IRON AND IRON BINDING CAPACITY (CC-WL,HP ONLY)
Iron: 76 ug/dL (ref 28–170)
Saturation Ratios: 21 % (ref 10.4–31.8)
TIBC: 371 ug/dL (ref 250–450)
UIBC: 295 ug/dL (ref 148–442)

## 2023-09-24 ENCOUNTER — Encounter: Payer: Self-pay | Admitting: *Deleted

## 2023-09-24 ENCOUNTER — Encounter: Payer: Self-pay | Admitting: Hematology & Oncology

## 2023-10-02 ENCOUNTER — Other Ambulatory Visit (HOSPITAL_COMMUNITY): Payer: Self-pay

## 2023-10-09 ENCOUNTER — Ambulatory Visit (INDEPENDENT_AMBULATORY_CARE_PROVIDER_SITE_OTHER): Payer: 59 | Admitting: Podiatry

## 2023-10-09 ENCOUNTER — Encounter: Payer: Self-pay | Admitting: Podiatry

## 2023-10-09 DIAGNOSIS — E119 Type 2 diabetes mellitus without complications: Secondary | ICD-10-CM | POA: Diagnosis not present

## 2023-10-09 DIAGNOSIS — M79674 Pain in right toe(s): Secondary | ICD-10-CM

## 2023-10-09 DIAGNOSIS — B351 Tinea unguium: Secondary | ICD-10-CM | POA: Diagnosis not present

## 2023-10-09 DIAGNOSIS — M79675 Pain in left toe(s): Secondary | ICD-10-CM

## 2023-10-10 NOTE — Progress Notes (Signed)
This patient presents to the office with chief complaint of long thick nails and diabetic feet.  This patient  says there  is  no pain and discomfort in their feet.  This patient says there are long thick painful nails.  These nails are painful walking and wearing shoes.  Patient has no history of infection or drainage from both feet.  Patient is unable to  self treat his own nails . Patient presents to the office with her daughter.   This patient presents  to the office today for treatment of the  long nails and a foot evaluation due to history of  diabetes.  Patient also has history of coagulation defect and CVA.Marland Kitchen  General Appearance  Alert, conversant and in no acute stress.  Vascular  Dorsalis pedis and posterior tibial  pulses are palpable  bilaterally.  Capillary return is within normal limits  bilaterally. Temperature is within normal limits  bilaterally.  Neurologic  Senn-Weinstein monofilament wire test within normal limits  bilaterally. Muscle power within normal limits bilaterally.  Nails Thick disfigured discolored nails with subungual debris  from hallux to fifth toes bilaterally. No evidence of bacterial infection or drainage bilaterally.  Orthopedic  No limitations of motion of motion feet .  No crepitus or effusions noted.  No bony pathology or digital deformities noted.  Skin  normotropic skin with no porokeratosis noted bilaterally.  No signs of infections or ulcers noted.     Onychomycosis  Diabetes with no foot complications  IE.  Debride nails with nail nipper and dremel tool.   RTC 3 months.   Helane Gunther DPM

## 2023-10-12 ENCOUNTER — Encounter: Payer: Self-pay | Admitting: Hematology & Oncology

## 2023-10-18 ENCOUNTER — Other Ambulatory Visit: Payer: Self-pay

## 2023-10-19 ENCOUNTER — Inpatient Hospital Stay: Payer: 59

## 2023-10-19 ENCOUNTER — Other Ambulatory Visit: Payer: Self-pay

## 2023-10-19 ENCOUNTER — Inpatient Hospital Stay (HOSPITAL_BASED_OUTPATIENT_CLINIC_OR_DEPARTMENT_OTHER): Payer: 59 | Admitting: Hematology & Oncology

## 2023-10-19 ENCOUNTER — Inpatient Hospital Stay: Payer: 59 | Attending: Hematology & Oncology

## 2023-10-19 ENCOUNTER — Encounter: Payer: Self-pay | Admitting: *Deleted

## 2023-10-19 VITALS — BP 182/77 | HR 91 | Temp 97.8°F | Resp 17 | Ht 60.0 in | Wt 159.0 lb

## 2023-10-19 DIAGNOSIS — C20 Malignant neoplasm of rectum: Secondary | ICD-10-CM | POA: Insufficient documentation

## 2023-10-19 DIAGNOSIS — R42 Dizziness and giddiness: Secondary | ICD-10-CM | POA: Diagnosis not present

## 2023-10-19 DIAGNOSIS — Z79890 Hormone replacement therapy (postmenopausal): Secondary | ICD-10-CM | POA: Insufficient documentation

## 2023-10-19 DIAGNOSIS — D500 Iron deficiency anemia secondary to blood loss (chronic): Secondary | ICD-10-CM

## 2023-10-19 DIAGNOSIS — Z79899 Other long term (current) drug therapy: Secondary | ICD-10-CM | POA: Insufficient documentation

## 2023-10-19 DIAGNOSIS — R5383 Other fatigue: Secondary | ICD-10-CM | POA: Diagnosis not present

## 2023-10-19 DIAGNOSIS — Z95828 Presence of other vascular implants and grafts: Secondary | ICD-10-CM

## 2023-10-19 DIAGNOSIS — R918 Other nonspecific abnormal finding of lung field: Secondary | ICD-10-CM | POA: Insufficient documentation

## 2023-10-19 DIAGNOSIS — K921 Melena: Secondary | ICD-10-CM | POA: Insufficient documentation

## 2023-10-19 LAB — CMP (CANCER CENTER ONLY)
ALT: 24 U/L (ref 0–44)
AST: 20 U/L (ref 15–41)
Albumin: 4.3 g/dL (ref 3.5–5.0)
Alkaline Phosphatase: 73 U/L (ref 38–126)
Anion gap: 11 (ref 5–15)
BUN: 17 mg/dL (ref 8–23)
CO2: 29 mmol/L (ref 22–32)
Calcium: 9.9 mg/dL (ref 8.9–10.3)
Chloride: 99 mmol/L (ref 98–111)
Creatinine: 0.88 mg/dL (ref 0.44–1.00)
GFR, Estimated: >60 mL/min (ref >60)
Glucose, Bld: 114 mg/dL — ABNORMAL HIGH (ref 70–99)
Potassium: 3.4 mmol/L — ABNORMAL LOW (ref 3.5–5.1)
Sodium: 139 mmol/L (ref 135–145)
Total Bilirubin: 0.7 mg/dL (ref 0.0–1.2)
Total Protein: 7.1 g/dL (ref 6.5–8.1)

## 2023-10-19 LAB — CBC WITH DIFFERENTIAL (CANCER CENTER ONLY)
Abs Immature Granulocytes: 0.02 10*3/uL (ref 0.00–0.07)
Basophils Absolute: 0.1 10*3/uL (ref 0.0–0.1)
Basophils Relative: 1 %
Eosinophils Absolute: 0.1 10*3/uL (ref 0.0–0.5)
Eosinophils Relative: 2 %
HCT: 43.1 % (ref 36.0–46.0)
Hemoglobin: 14.5 g/dL (ref 12.0–15.0)
Immature Granulocytes: 0 %
Lymphocytes Relative: 43 %
Lymphs Abs: 3.1 10*3/uL (ref 0.7–4.0)
MCH: 31 pg (ref 26.0–34.0)
MCHC: 33.6 g/dL (ref 30.0–36.0)
MCV: 92.3 fL (ref 80.0–100.0)
Monocytes Absolute: 0.7 10*3/uL (ref 0.1–1.0)
Monocytes Relative: 10 %
Neutro Abs: 3.2 10*3/uL (ref 1.7–7.7)
Neutrophils Relative %: 44 %
Platelet Count: 209 10*3/uL (ref 150–400)
RBC: 4.67 MIL/uL (ref 3.87–5.11)
RDW: 14.3 % (ref 11.5–15.5)
WBC Count: 7.2 10*3/uL (ref 4.0–10.5)
nRBC: 0 % (ref 0.0–0.2)

## 2023-10-19 LAB — RETICULOCYTES
Immature Retic Fract: 11 % (ref 2.3–15.9)
RBC.: 4.69 MIL/uL (ref 3.87–5.11)
Retic Count, Absolute: 99.9 10*3/uL (ref 19.0–186.0)
Retic Ct Pct: 2.1 % (ref 0.4–3.1)

## 2023-10-19 LAB — CEA (ACCESS): CEA (CHCC): 1.11 ng/mL (ref 0.00–5.00)

## 2023-10-19 LAB — FERRITIN: Ferritin: 318 ng/mL — ABNORMAL HIGH (ref 11–307)

## 2023-10-19 MED ORDER — SODIUM CHLORIDE 0.9% FLUSH
10.0000 mL | INTRAVENOUS | Status: DC | PRN
  Administered 2023-10-19: 10 mL via INTRAVENOUS

## 2023-10-19 MED ORDER — HEPARIN SOD (PORK) LOCK FLUSH 100 UNIT/ML IV SOLN
500.0000 [IU] | Freq: Once | INTRAVENOUS | Status: AC
  Administered 2023-10-19: 500 [IU] via INTRAVENOUS

## 2023-10-19 NOTE — Progress Notes (Signed)
 Hematology and Oncology Follow Up Visit  [name] [id number] 1929/08/05 89 y.o. 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 3 weeks-s/p cycle #1  -- start on 02/09/2023 -- d/c on 04/12/2023 Xeloda  1500 mg po BID (14/7) -- s/p cycle #6 on 02/13/2023 -- changed to 1000mg  po BID on 08/31/2023 --on hold since 08/2023      Interim History:  [name] is back for follow-up.  She has she is doing quite nicely.  She has been off the Xeloda  for a couple months.  I think it really has helped her skin out.  She has had no problems since we last saw her.  There is been no abdominal pain.  She has had no rectal pain.  There is been no problems with bowels or bladder.  She has had no cough or shortness of breath.  There is been no melena or bright red blood per rectum.  Her hands and feet finally have gotten better.  She had severe PPE from the Xeloda .  Her last CEA level was 1.9.  She has had a decent appetite.  She has had no bleeding.  She has had no rashes.  There is been no headache.  She has had no mouth sores.  Overall, I would say that her performance status is probably ECOG 1.    Medications:  Current Outpatient Medications:    acetaminophen  (TYLENOL ) 325 MG tablet, Take 2 tablets (650 mg total) by mouth every 6 (six) hours as needed for mild pain (or Fever >/= 101)., Disp: 30 tablet, Rfl: 0   ALPRAZolam  (XANAX ) 0.5 MG tablet, Take 0.5 mg by mouth 3 (three) times daily as needed for anxiety. For anxiety, Disp: , Rfl:    amiodarone  (PACERONE ) 200 MG tablet, Take 0.5 tablets (100 mg total) by mouth daily., Disp: 45 tablet, Rfl: 2   amLODipine  (NORVASC ) 5 MG tablet, Take 5 mg by mouth in the morning and at bedtime., Disp: , Rfl:    capecitabine  (XELODA ) 500 MG tablet, Take 3 tablets (1,500 mg total) by mouth 2 (two) times daily after a meal. Take within 30 minutes after meals.  Take for 14 days on, 7 days off. Repeat every 21 days. (Patient not taking: Reported on 09/20/2023), Disp: 84 tablet, Rfl: 5   cholecalciferol (VITAMIN D3) 25 MCG (1000 UNIT) tablet, Take 1,000 Units by mouth daily., Disp: , Rfl:    clotrimazole  (LOTRIMIN ) 1 % cream, Apply 1 Application topically 2 (two) times daily. (Patient not taking: Reported on 09/20/2023), Disp: 30 g, Rfl: 0   cyanocobalamin  1000 MCG tablet, Take 1 tablet (1,000 mcg total) by mouth daily., Disp: 30 tablet, Rfl: 0   escitalopram  (LEXAPRO ) 5 MG tablet, Take 5 mg by mouth at bedtime., Disp: , Rfl:    famotidine  (PEPCID ) 20 MG tablet, Take 20 mg by mouth daily., Disp: , Rfl:    feeding supplement (ENSURE ENLIVE / ENSURE PLUS) LIQD, Take 237 mLs by mouth 3 (three) times daily with meals. (Patient taking differently: Take 237 mLs by mouth 3 (three) times daily as needed (nutrition).), Disp: 237 mL, Rfl: 12   fluticasone  (FLONASE ) 50 MCG/ACT nasal spray, Place 2 sprays into both nostrils daily. (Patient not taking: Reported on 09/20/2023), Disp: 11.1 mL, Rfl: 0   guaiFENesin -dextromethorphan  (ROBITUSSIN DM) 100-10 MG/5ML syrup, Take 10 mLs by mouth every 4 (four) hours as needed for cough. (Patient not taking: Reported on  09/20/2023), Disp: 118 mL, Rfl: 0   HYDROcodone -acetaminophen  (NORCO/VICODIN) 5-325 MG tablet, Take 1-2 tablets by mouth every 4 (four) hours as needed for moderate pain. (Patient not taking: Reported on 09/20/2023), Disp: 30 tablet, Rfl: 0   levothyroxine  (SYNTHROID ) 175 MCG tablet, Take 175 mcg by mouth every morning., Disp: , Rfl:    lidocaine -prilocaine  (EMLA ) cream, Apply 1 Application topically as needed., Disp: 30 g, Rfl: 4   loratadine  (CLARITIN ) 10 MG tablet, Take 1 tablet (10 mg total) by mouth daily., Disp: 30 tablet, Rfl: 0   methocarbamol  (ROBAXIN ) 500 MG tablet, Take 1 tablet (500 mg total) by mouth every 8 (eight) hours as needed for muscle spasms (abd pain). (Patient not taking: Reported on 09/20/2023), Disp: 10  tablet, Rfl: 0   pantoprazole  (PROTONIX ) 40 MG tablet, Take 40 mg by mouth daily., Disp: , Rfl:    polyethylene glycol (MIRALAX  / GLYCOLAX ) 17 g packet, Take 17 g by mouth daily as needed for mild constipation., Disp: 14 each, Rfl: 0   potassium chloride  SA (K-DUR,KLOR-CON ) 20 MEQ tablet, Take 20 mEq by mouth daily., Disp: , Rfl:    prochlorperazine  (COMPAZINE ) 10 MG tablet, Take 1 tablet (10 mg total) by mouth every 6 (six) hours as needed., Disp: 30 tablet, Rfl: 1   triamterene -hydrochlorothiazide  (MAXZIDE-25) 37.5-25 MG tablet, Take 1 tablet by mouth daily., Disp: 90 tablet, Rfl: 1   TRUE METRIX BLOOD GLUCOSE TEST test strip, , Disp: , Rfl:   Allergies:  Allergies  Allergen Reactions   Aggrenox  [Aspirin -Dipyridamole  Er] Other (See Comments)    Severe Bleeding and Stomach Pain   Prednisone  Other (See Comments)    Climbs the walls    Past Medical History, Surgical history, Social history, and Family History were reviewed and updated.  Review of Systems: Review of Systems  Constitutional:  Positive for fatigue.  HENT:  Negative.    Eyes: Negative.   Respiratory: Negative.    Cardiovascular:  Positive for palpitations.  Gastrointestinal:  Positive for blood in stool and rectal pain.  Endocrine: Negative.   Genitourinary: Negative.    Musculoskeletal: Negative.   Skin: Negative.   Neurological:  Positive for light-headedness.  Hematological: Negative.   Psychiatric/Behavioral: Negative.      Physical Exam: Her vital signs show temperature of 97.8.  Pulse 91.  Blood pressure 182/77.  Weight is 159 pounds.    Wt Readings from Last 3 Encounters:  08/31/23 157 lb (71.2 kg)  08/02/23 155 lb 6.4 oz (70.5 kg)  06/18/23 154 lb 3.2 oz (69.9 kg)    Physical Exam Vitals reviewed.  HENT:     Head: Normocephalic and atraumatic.  Eyes:     Pupils: Pupils are equal, round, and reactive to light.  Cardiovascular:     Rate and Rhythm: Normal rate and regular rhythm.     Heart  sounds: Normal heart sounds.     Comments: Cardiac exam is regular rate and rhythm.  She has no murmurs, rubs or bruits. Pulmonary:     Effort: Pulmonary effort is normal.     Breath sounds: Normal breath sounds.     Comments: She has good air movement bilaterally.  She has some crackles bilaterally.  I hear no wheezes. Abdominal:     General: Bowel sounds are normal.     Palpations: Abdomen is soft.  Musculoskeletal:        General: No tenderness or deformity. Normal range of motion.     Cervical back: Normal range of motion.  Lymphadenopathy:  Cervical: No cervical adenopathy.  Skin:    General: Skin is warm and dry.     Findings: No erythema or rash.  Neurological:     Mental Status: She is alert and oriented to person, place, and time.  Psychiatric:        Behavior: Behavior normal.        Thought Content: Thought content normal.        Judgment: Judgment normal.     Lab Results  Component Value Date   WBC 7.2 10/19/2023   HGB 14.5 10/19/2023   HCT 43.1 10/19/2023   MCV 92.3 10/19/2023   PLT 209 10/19/2023     Chemistry      Component Value Date/Time   NA 137 09/20/2023 1315   K 3.7 09/20/2023 1315   CL 99 09/20/2023 1315   CO2 27 09/20/2023 1315   BUN 15 09/20/2023 1315   CREATININE 0.85 09/20/2023 1315      Component Value Date/Time   CALCIUM  9.5 09/20/2023 1315   ALKPHOS 81 09/20/2023 1315   AST 24 09/20/2023 1315   ALT 28 09/20/2023 1315   BILITOT 0.6 09/20/2023 1315      Impression and Plan: Ms. Eskridge is a very charming 89 year old white female.  She has at least a locally advanced adenocarcinoma of the rectum.-I would have to say that she has had metastatic disease since the recent CT scan that she had done did not show any evidence of the pulmonary nodule.  For right now, we will continue her off the Xeloda .  This is all about quality of life.  She has had a very nice response.  We will see what her CEA level is.  I do think that we probably do  need to repeat her MRI of the pelvis probably in March.  She probably would also will need to have a CT scan of the chest.  Again, the longer we can keep her off medication, I think she will benefit from.  When she has her MRI and CT scan done, we will then get her back.   Maude JONELLE Crease, MD 2/7/20254:01 PM

## 2023-10-19 NOTE — Patient Instructions (Signed)

## 2023-10-22 ENCOUNTER — Encounter: Payer: Self-pay | Admitting: *Deleted

## 2023-10-22 ENCOUNTER — Encounter: Payer: Self-pay | Admitting: Hematology & Oncology

## 2023-10-22 ENCOUNTER — Other Ambulatory Visit: Payer: Self-pay

## 2023-10-22 LAB — IRON AND IRON BINDING CAPACITY (CC-WL,HP ONLY)
Iron: 104 ug/dL (ref 28–170)
Saturation Ratios: 30 % (ref 10.4–31.8)
TIBC: 353 ug/dL (ref 250–450)
UIBC: 249 ug/dL (ref 148–442)

## 2023-10-22 NOTE — Progress Notes (Signed)
 Patient will continue off treatment and be followed with scans. She has not had navigational needs for some time. Will discontinue active navigation at this time, but be available as needed in the future.   Oncology Nurse Navigator Documentation     10/19/2023    3:30 PM  Oncology Nurse Navigator Flowsheets  Navigation Complete Date: 10/19/2023  Post Navigation: Continue to Follow Patient? No  Reason Not Navigating Patient: No Treatment, Observation Only  Navigator Location [facility]  Navigator Encounter Type Follow-up Appt  Patient Visit Type MedOnc  Treatment Phase Other  Barriers/Navigation Needs No Barriers At This Time  Interventions None Required  Acuity Level 1-No Barriers  Support Groups/Services Friends and Family  Time Spent with Patient 15

## 2023-10-24 ENCOUNTER — Other Ambulatory Visit: Payer: Self-pay

## 2023-10-24 ENCOUNTER — Telehealth: Payer: Self-pay | Admitting: *Deleted

## 2023-10-24 NOTE — Telephone Encounter (Signed)
Received call from son [name].  Informed us that patients MRI had to be pushed out till April because patient got Feraheme in January and has to be scheduled 3 months after this dose.  Dr Myna Hidalgo notified.

## 2023-10-25 ENCOUNTER — Inpatient Hospital Stay (HOSPITAL_BASED_OUTPATIENT_CLINIC_OR_DEPARTMENT_OTHER): Payer: 59 | Admitting: Hematology & Oncology

## 2023-10-25 ENCOUNTER — Inpatient Hospital Stay: Payer: 59

## 2023-10-25 ENCOUNTER — Other Ambulatory Visit: Payer: Self-pay

## 2023-10-25 ENCOUNTER — Other Ambulatory Visit: Payer: Self-pay | Admitting: *Deleted

## 2023-10-25 ENCOUNTER — Encounter: Payer: Self-pay | Admitting: Hematology & Oncology

## 2023-10-25 ENCOUNTER — Telehealth: Payer: Self-pay | Admitting: *Deleted

## 2023-10-25 VITALS — BP 165/68 | HR 92 | Temp 97.6°F | Resp 21

## 2023-10-25 DIAGNOSIS — K922 Gastrointestinal hemorrhage, unspecified: Secondary | ICD-10-CM

## 2023-10-25 DIAGNOSIS — Z95828 Presence of other vascular implants and grafts: Secondary | ICD-10-CM

## 2023-10-25 DIAGNOSIS — Z860100 Personal history of colon polyps, unspecified: Secondary | ICD-10-CM

## 2023-10-25 DIAGNOSIS — C20 Malignant neoplasm of rectum: Secondary | ICD-10-CM

## 2023-10-25 DIAGNOSIS — K625 Hemorrhage of anus and rectum: Secondary | ICD-10-CM

## 2023-10-25 LAB — CBC WITH DIFFERENTIAL (CANCER CENTER ONLY)
Abs Immature Granulocytes: 0.02 10*3/uL (ref 0.00–0.07)
Basophils Absolute: 0.1 10*3/uL (ref 0.0–0.1)
Basophils Relative: 1 %
Eosinophils Absolute: 0.1 10*3/uL (ref 0.0–0.5)
Eosinophils Relative: 1 %
HCT: 43 % (ref 36.0–46.0)
Hemoglobin: 14.7 g/dL (ref 12.0–15.0)
Immature Granulocytes: 0 %
Lymphocytes Relative: 33 %
Lymphs Abs: 2.8 10*3/uL (ref 0.7–4.0)
MCH: 31.3 pg (ref 26.0–34.0)
MCHC: 34.2 g/dL (ref 30.0–36.0)
MCV: 91.5 fL (ref 80.0–100.0)
Monocytes Absolute: 0.7 10*3/uL (ref 0.1–1.0)
Monocytes Relative: 8 %
Neutro Abs: 4.7 10*3/uL (ref 1.7–7.7)
Neutrophils Relative %: 57 %
Platelet Count: 210 10*3/uL (ref 150–400)
RBC: 4.7 MIL/uL (ref 3.87–5.11)
RDW: 13.8 % (ref 11.5–15.5)
WBC Count: 8.3 10*3/uL (ref 4.0–10.5)
nRBC: 0 % (ref 0.0–0.2)

## 2023-10-25 LAB — CMP (CANCER CENTER ONLY)
ALT: 27 U/L (ref 0–44)
AST: 20 U/L (ref 15–41)
Albumin: 4.2 g/dL (ref 3.5–5.0)
Alkaline Phosphatase: 75 U/L (ref 38–126)
Anion gap: 9 (ref 5–15)
BUN: 19 mg/dL (ref 8–23)
CO2: 31 mmol/L (ref 22–32)
Calcium: 9.8 mg/dL (ref 8.9–10.3)
Chloride: 99 mmol/L (ref 98–111)
Creatinine: 0.86 mg/dL (ref 0.44–1.00)
GFR, Estimated: >60 mL/min (ref >60)
Glucose, Bld: 177 mg/dL — ABNORMAL HIGH (ref 70–99)
Potassium: 3.4 mmol/L — ABNORMAL LOW (ref 3.5–5.1)
Sodium: 139 mmol/L (ref 135–145)
Total Bilirubin: 0.5 mg/dL (ref 0.0–1.2)
Total Protein: 6.7 g/dL (ref 6.5–8.1)

## 2023-10-25 LAB — SAMPLE TO BLOOD BANK

## 2023-10-25 MED ORDER — SODIUM CHLORIDE 0.9% FLUSH
10.0000 mL | INTRAVENOUS | Status: DC | PRN
Start: 2023-10-25 — End: 2023-10-25
  Administered 2023-10-25: 10 mL via INTRAVENOUS

## 2023-10-25 MED ORDER — HEPARIN SOD (PORK) LOCK FLUSH 100 UNIT/ML IV SOLN
500.0000 [IU] | Freq: Once | INTRAVENOUS | Status: AC
  Administered 2023-10-25: 500 [IU] via INTRAVENOUS

## 2023-10-25 NOTE — Progress Notes (Signed)
Hematology and Oncology Follow Up Visit  [name] [id number] [birth year] 89 y.o. 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 3 weeks-s/p cycle #1  -- start on 02/09/2023 -- d/c on 04/12/2023 Xeloda 1500 mg po BID (14/7) -- s/p cycle #6 on 02/13/2023 -- changed to 1000mg  po BID on 08/31/2023 --on hold since 08/2023      Interim History:  [name] is back for an early unscheduled visit.  Apparently, she has been having issues with rectal bleeding.  She has also had some dizziness.  She is fatigued.  She does not have as much energy.  I just saw her a week or so ago.  Everything is doing okay.  Her CEA level was normal at 1.11.   The bleeding is bright red.  I do not know if this is from an internal hemorrhoid.  I do not see any external hemorrhoids when I examined her.  I cannot feel anything in the rectal vault when I did my right digital rectal exam.  Again, I do not think that the dizziness and fatigue are anything related to this bleeding.  She is not anemic.  Her blood pressure is on the high side.  She has had no fever.  She has had no diarrhea.  She may have little bit of constipation.  She has had no nausea or or vomiting.  Overall, her performance status is ECOG 2.    Medications:  Current Outpatient Medications:    acetaminophen (TYLENOL) 325 MG tablet, Take 2 tablets (650 mg total) by mouth every 6 (six) hours as needed for mild pain (or Fever >/= 101)., Disp: 30 tablet, Rfl: 0   ALPRAZolam (XANAX) 0.5 MG tablet, Take 0.5 mg by mouth 3 (three) times daily as needed for anxiety. For anxiety, Disp: , Rfl:    amiodarone (PACERONE) 200 MG tablet, Take 0.5 tablets (100 mg total) by mouth daily., Disp: 45 tablet, Rfl: 2   amLODipine (NORVASC) 5 MG tablet, Take 5 mg by mouth in the morning and at bedtime., Disp: , Rfl:    capecitabine (XELODA) 500 MG tablet, Take  3 tablets (1,500 mg total) by mouth 2 (two) times daily after a meal. Take within 30 minutes after meals. Take for 14 days on, 7 days off. Repeat every 21 days. (Patient not taking: Reported on 09/20/2023), Disp: 84 tablet, Rfl: 5   cholecalciferol (VITAMIN D3) 25 MCG (1000 UNIT) tablet, Take 1,000 Units by mouth daily., Disp: , Rfl:    clotrimazole (LOTRIMIN) 1 % cream, Apply 1 Application topically 2 (two) times daily. (Patient not taking: Reported on 09/20/2023), Disp: 30 g, Rfl: 0   cyanocobalamin 1000 MCG tablet, Take 1 tablet (1,000 mcg total) by mouth daily., Disp: 30 tablet, Rfl: 0   escitalopram (LEXAPRO) 5 MG tablet, Take 5 mg by mouth at bedtime., Disp: , Rfl:    famotidine (PEPCID) 20 MG tablet, Take 20 mg by mouth daily., Disp: , Rfl:    feeding supplement (ENSURE ENLIVE / ENSURE PLUS) LIQD, Take 237 mLs by mouth 3 (three) times daily with meals. (Patient taking differently: Take 237 mLs by mouth 3 (three) times daily as needed (nutrition).), Disp: 237 mL, Rfl: 12   fluticasone (FLONASE) 50 MCG/ACT nasal spray, Place 2 sprays into both nostrils daily. (Patient not taking: Reported on 09/20/2023), Disp: 11.1 mL, Rfl: 0   guaiFENesin-dextromethorphan (ROBITUSSIN DM) 100-10 MG/5ML syrup, Take 10  mLs by mouth every 4 (four) hours as needed for cough. (Patient not taking: Reported on 09/20/2023), Disp: 118 mL, Rfl: 0   HYDROcodone-acetaminophen (NORCO/VICODIN) 5-325 MG tablet, Take 1-2 tablets by mouth every 4 (four) hours as needed for moderate pain. (Patient not taking: Reported on 09/20/2023), Disp: 30 tablet, Rfl: 0   levothyroxine (SYNTHROID) 175 MCG tablet, Take 175 mcg by mouth every morning., Disp: , Rfl:    lidocaine-prilocaine (EMLA) cream, Apply 1 Application topically as needed., Disp: 30 g, Rfl: 4   loratadine (CLARITIN) 10 MG tablet, Take 1 tablet (10 mg total) by mouth daily., Disp: 30 tablet, Rfl: 0   methocarbamol (ROBAXIN) 500 MG tablet, Take 1 tablet (500 mg total) by mouth every 8  (eight) hours as needed for muscle spasms (abd pain). (Patient not taking: Reported on 09/20/2023), Disp: 10 tablet, Rfl: 0   pantoprazole (PROTONIX) 40 MG tablet, Take 40 mg by mouth daily., Disp: , Rfl:    polyethylene glycol (MIRALAX / GLYCOLAX) 17 g packet, Take 17 g by mouth daily as needed for mild constipation., Disp: 14 each, Rfl: 0   potassium chloride SA (K-DUR,KLOR-CON) 20 MEQ tablet, Take 20 mEq by mouth daily., Disp: , Rfl:    prochlorperazine (COMPAZINE) 10 MG tablet, Take 1 tablet (10 mg total) by mouth every 6 (six) hours as needed., Disp: 30 tablet, Rfl: 1   triamterene-hydrochlorothiazide (MAXZIDE-25) 37.5-25 MG tablet, Take 1 tablet by mouth daily., Disp: 90 tablet, Rfl: 1   TRUE METRIX BLOOD GLUCOSE TEST test strip, , Disp: , Rfl:  No current facility-administered medications for this visit.  Facility-Administered Medications Ordered in Other Visits:    sodium chloride flush (NS) 0.9 % injection 10 mL, 10 mL, Intravenous, PRN, Josph Macho, MD, 10 mL at 10/25/23 1539  Allergies:  Allergies  Allergen Reactions   Aggrenox [Aspirin-Dipyridamole Er] Other (See Comments)    Severe Bleeding and Stomach Pain   Prednisone Other (See Comments)    Climbs the walls    Past Medical History, Surgical history, Social history, and Family History were reviewed and updated.  Review of Systems: Review of Systems  Constitutional:  Positive for fatigue.  HENT:  Negative.    Eyes: Negative.   Respiratory: Negative.    Cardiovascular:  Positive for palpitations.  Gastrointestinal:  Positive for blood in stool and rectal pain.  Endocrine: Negative.   Genitourinary: Negative.    Musculoskeletal: Negative.   Skin: Negative.   Neurological:  Positive for light-headedness.  Hematological: Negative.   Psychiatric/Behavioral: Negative.      Physical Exam: Her vital signs show temperature of 97.6.  Pulse 92.  Blood pressure 165/68 .  Weight is 159 pounds.    Wt Readings from Last 3  Encounters:  10/19/23 159 lb (72.1 kg)  08/31/23 157 lb (71.2 kg)  08/02/23 155 lb 6.4 oz (70.5 kg)    Physical Exam Vitals reviewed.  HENT:     Head: Normocephalic and atraumatic.  Eyes:     Pupils: Pupils are equal, round, and reactive to light.  Cardiovascular:     Rate and Rhythm: Normal rate and regular rhythm.     Heart sounds: Normal heart sounds.     Comments: Cardiac exam is regular rate and rhythm.  She has no murmurs, rubs or bruits. Pulmonary:     Effort: Pulmonary effort is normal.     Breath sounds: Normal breath sounds.     Comments: She has good air movement bilaterally.  She has some crackles  bilaterally.  I hear no wheezes. Abdominal:     General: Bowel sounds are normal.     Palpations: Abdomen is soft.  Musculoskeletal:        General: No tenderness or deformity. Normal range of motion.     Cervical back: Normal range of motion.  Lymphadenopathy:     Cervical: No cervical adenopathy.  Skin:    General: Skin is warm and dry.     Findings: No erythema or rash.  Neurological:     Mental Status: She is alert and oriented to person, place, and time.  Psychiatric:        Behavior: Behavior normal.        Thought Content: Thought content normal.        Judgment: Judgment normal.    Lab Results  Component Value Date   WBC 8.3 10/25/2023   HGB 14.7 10/25/2023   HCT 43.0 10/25/2023   MCV 91.5 10/25/2023   PLT 210 10/25/2023     Chemistry      Component Value Date/Time   NA 139 10/25/2023 1507   K 3.4 (L) 10/25/2023 1507   CL 99 10/25/2023 1507   CO2 31 10/25/2023 1507   BUN 19 10/25/2023 1507   CREATININE 0.86 10/25/2023 1507      Component Value Date/Time   CALCIUM 9.8 10/25/2023 1507   ALKPHOS 75 10/25/2023 1507   AST 20 10/25/2023 1507   ALT 27 10/25/2023 1507   BILITOT 0.5 10/25/2023 1507      Impression and Plan: Ms. Petree is a very charming 89 year old white female.  She has at least a locally advanced adenocarcinoma of the rectum.-I  would have to say that she has had metastatic disease since the recent CT scan that she had done did not show any evidence of the pulmonary nodule.  Again, I am not sure what the etiology of this rectal bleeding is.  It looks like some hematochezia.  I suppose it might be from the rectal tumor.  I cannot feel any rectal tumor when I examined her.  Will go ahead to have gastroenterology look at her and do a sigmoidoscopy on her.  Again, there are certainly nonmalignant possibilities for this bleeding.  She does not need to be transfused.  She actually has a better hemoglobin than before.  We will continue to hold off on the Xeloda.  Again, I think a flexible sigmoidoscopy is the way to go.  I will contact her Gastroenterologist.  We will just keep her appointment for right now, that we have scheduled previously.Marland Kitchen   Josph Macho, MD 2/13/20254:19 PM

## 2023-10-25 NOTE — Telephone Encounter (Signed)
Returned patient's phone call regarding his mother about blood in her stool for several days. He stated,"we went to the PCP yesterday because she was lightheaded and felt faint yesterday. They did not do a rectal exam because we didn't tell them about the bleeding. I was more worried about the lightheadedness. There is no bright blood only a pinkish in color on the toilet paper. There is no blood in the toilet. Per Dr. Myna Hidalgo, bring her in today for labs and see me. Son informed and stated,"we don't have any way of getting her there today because my fiance is a Runner, broadcasting/film/video and she is working." Dr. Myna Hidalgo aware of no transportation. He is in agreement that the son should call 911 if she starts to have any symptoms or the bleeding increases. He verbalized understanding.

## 2023-10-26 ENCOUNTER — Encounter: Payer: Self-pay | Admitting: Hematology & Oncology

## 2023-11-01 ENCOUNTER — Encounter: Payer: Self-pay | Admitting: Hematology & Oncology

## 2023-11-08 ENCOUNTER — Encounter: Payer: Self-pay | Admitting: Hematology & Oncology

## 2023-11-08 ENCOUNTER — Telehealth: Payer: Self-pay | Admitting: Internal Medicine

## 2023-11-08 NOTE — Telephone Encounter (Signed)
 Christina Spare do you know anything about this pt? There is a note from the cancer center stating they had discussed with Dr Chales Abrahams about doing a colon on pt. I sent message to gupta but have not heard back.

## 2023-11-08 NOTE — Telephone Encounter (Signed)
 Pts son states she needs a colon per Dr Myna Hidalgo, States it was discussed with Dr Chales Abrahams. He did the hospital procedure. Does she need to be scheduled with Dr Chales Abrahams or Dr Rhea Belton. Please advise.

## 2023-11-08 NOTE — Telephone Encounter (Signed)
 Inbound call from patient's son, wishing to schedule a colonoscopy for patient. He states her oncologist Dr. Myna Hidalgo spoke to Dr. Chales Abrahams in regards to scheduling a colonoscopy due to rectal bleeding. Patient was first seen by Dr. Rhea Belton in the hospital. Wishing to receive a call back in regards to mother's care. Please advise.

## 2023-11-12 ENCOUNTER — Other Ambulatory Visit: Payer: Self-pay

## 2023-11-12 ENCOUNTER — Encounter: Payer: Self-pay | Admitting: Hematology & Oncology

## 2023-11-12 DIAGNOSIS — K922 Gastrointestinal hemorrhage, unspecified: Secondary | ICD-10-CM

## 2023-11-12 NOTE — Telephone Encounter (Signed)
 [name], If you recall secure chat messsages Need FS with me ASAP RG

## 2023-11-12 NOTE — Telephone Encounter (Signed)
 Pt son [name] was notified of Dr. Chales Abrahams recommendations. [name] was notified that we could schedule the pt for the flex sigmoidoscopy this week on Wednesday. [name] stated that the pt blood pressure has been extremely high and that her PCP is working on getting it down. [name] stated that he would feel more comfortable in waiting until BP is under control before scheduling. [name] was notified to give [place] a call back when BP is under control and he is ready to proceed with scheduling the procedure. [name] verbalized understanding with all questions answered.

## 2023-11-13 ENCOUNTER — Ambulatory Visit: Payer: Medicare PPO | Admitting: Cardiovascular Disease

## 2023-11-13 ENCOUNTER — Telehealth: Payer: Self-pay | Admitting: Hematology & Oncology

## 2023-11-13 ENCOUNTER — Encounter: Payer: Self-pay | Admitting: Hematology & Oncology

## 2023-11-13 NOTE — Telephone Encounter (Signed)
 Called to schedule Lab only appt per inbasket. LVM to return call for scheduling.

## 2023-11-14 ENCOUNTER — Other Ambulatory Visit: Payer: Self-pay | Admitting: *Deleted

## 2023-11-14 ENCOUNTER — Inpatient Hospital Stay: Attending: Hematology & Oncology

## 2023-11-14 DIAGNOSIS — D509 Iron deficiency anemia, unspecified: Secondary | ICD-10-CM

## 2023-11-14 DIAGNOSIS — Z79899 Other long term (current) drug therapy: Secondary | ICD-10-CM | POA: Insufficient documentation

## 2023-11-14 DIAGNOSIS — Z79890 Hormone replacement therapy (postmenopausal): Secondary | ICD-10-CM | POA: Diagnosis not present

## 2023-11-14 DIAGNOSIS — R918 Other nonspecific abnormal finding of lung field: Secondary | ICD-10-CM | POA: Diagnosis not present

## 2023-11-14 DIAGNOSIS — K922 Gastrointestinal hemorrhage, unspecified: Secondary | ICD-10-CM

## 2023-11-14 DIAGNOSIS — Z1509 Genetic susceptibility to other malignant neoplasm: Secondary | ICD-10-CM | POA: Diagnosis not present

## 2023-11-14 DIAGNOSIS — D500 Iron deficiency anemia secondary to blood loss (chronic): Secondary | ICD-10-CM

## 2023-11-14 DIAGNOSIS — K625 Hemorrhage of anus and rectum: Secondary | ICD-10-CM

## 2023-11-14 DIAGNOSIS — C20 Malignant neoplasm of rectum: Secondary | ICD-10-CM | POA: Insufficient documentation

## 2023-11-14 LAB — CBC WITH DIFFERENTIAL (CANCER CENTER ONLY)
Abs Immature Granulocytes: 0.03 10*3/uL (ref 0.00–0.07)
Basophils Absolute: 0.1 10*3/uL (ref 0.0–0.1)
Basophils Relative: 1 %
Eosinophils Absolute: 0.2 10*3/uL (ref 0.0–0.5)
Eosinophils Relative: 2 %
HCT: 43.3 % (ref 36.0–46.0)
Hemoglobin: 14.7 g/dL (ref 12.0–15.0)
Immature Granulocytes: 0 %
Lymphocytes Relative: 39 %
Lymphs Abs: 2.9 10*3/uL (ref 0.7–4.0)
MCH: 30.4 pg (ref 26.0–34.0)
MCHC: 33.9 g/dL (ref 30.0–36.0)
MCV: 89.5 fL (ref 80.0–100.0)
Monocytes Absolute: 0.8 10*3/uL (ref 0.1–1.0)
Monocytes Relative: 10 %
Neutro Abs: 3.5 10*3/uL (ref 1.7–7.7)
Neutrophils Relative %: 48 %
Platelet Count: 229 10*3/uL (ref 150–400)
RBC: 4.84 MIL/uL (ref 3.87–5.11)
RDW: 14.4 % (ref 11.5–15.5)
WBC Count: 7.4 10*3/uL (ref 4.0–10.5)
nRBC: 0 % (ref 0.0–0.2)

## 2023-11-14 LAB — SAMPLE TO BLOOD BANK

## 2023-11-14 LAB — FERRITIN: Ferritin: 276 ng/mL (ref 11–307)

## 2023-11-15 ENCOUNTER — Telehealth: Payer: Self-pay

## 2023-11-15 LAB — IRON AND IRON BINDING CAPACITY (CC-WL,HP ONLY)
Iron: 110 ug/dL (ref 28–170)
Saturation Ratios: 30 % (ref 10.4–31.8)
TIBC: 370 ug/dL (ref 250–450)
UIBC: 260 ug/dL (ref 148–442)

## 2023-11-15 NOTE — Telephone Encounter (Signed)
-----   Message from Josph Macho sent at 11/15/2023 10:42 AM EST ----- Please call and let her know that the iron level is okay.  Cindee Lame

## 2023-11-15 NOTE — Telephone Encounter (Signed)
 Spoke with patient's son [name] & he stated PCP recommended they postpone flex sig for 2-4 weeks d/t elevated BP. PCP has adjusted BP medications. She had labs drawn with Dr. Gustavo Lah office on 11/13/23 & hgb was 14.7. Son states bleeding has improved, no bleeding over the last 2 days where as before it was with every bowel movement. He would like for Dr. Chales Abrahams to review & advise if it is okay to postpone procedure and if procedure should still happen based on recent labs and improvement in symptoms.

## 2023-11-15 NOTE — Telephone Encounter (Signed)
 Advised via MyChart.

## 2023-11-15 NOTE — Telephone Encounter (Signed)
 Inbound call from patient requesting a call regarding previous notes. Please advise, thank you.

## 2023-11-15 NOTE — Telephone Encounter (Signed)
 Patient son called and stated that he had a list of concerns regarding his mother procedure that he would like to go over with nurse Viviann Spare. Patient son is requesting a call back.

## 2023-11-16 NOTE — Telephone Encounter (Signed)
 [name], OK to postpone flexible sigmoidoscopy for now. Please schedule in 4 weeks- so, we have a spot. OK for 7:30 slot, if needed RG  Will send info to Dr. Myna Hidalgo as well

## 2023-11-16 NOTE — Telephone Encounter (Signed)
 Scheduled pt for soonest flex sig with Dr. Chales Abrahams on 01/03/24 at 9:00 am. PV for 4/7 at 2:00 pm by phone. No blood thinners/diabetic meds. Advised son & patient that we could do sooner date closer to 4 week time frame with another provider, however pt only wants Dr. Chales Abrahams. Will make Dr. Chales Abrahams aware & pt son aware to contact us sooner if needed with any questions/concerns.

## 2023-11-21 ENCOUNTER — Ambulatory Visit (HOSPITAL_COMMUNITY)
Admission: RE | Admit: 2023-11-21 | Discharge: 2023-11-21 | Disposition: A | Discharge status: Home / Self Care | Payer: Medicare Other | Source: Ambulatory Visit | Attending: Hematology & Oncology | Admitting: Hematology & Oncology

## 2023-11-21 DIAGNOSIS — C20 Malignant neoplasm of rectum: Secondary | ICD-10-CM | POA: Insufficient documentation

## 2023-11-21 MED ORDER — IOHEXOL 300 MG/ML  SOLN
75.0000 mL | Freq: Once | INTRAMUSCULAR | Status: AC | PRN
  Administered 2023-11-21: 75 mL via INTRAVENOUS

## 2023-11-26 ENCOUNTER — Encounter: Payer: Self-pay | Admitting: Hematology & Oncology

## 2023-11-29 ENCOUNTER — Inpatient Hospital Stay: Payer: 59

## 2023-11-29 ENCOUNTER — Encounter: Payer: Self-pay | Admitting: Hematology & Oncology

## 2023-11-29 ENCOUNTER — Inpatient Hospital Stay (HOSPITAL_BASED_OUTPATIENT_CLINIC_OR_DEPARTMENT_OTHER): Payer: 59 | Admitting: Hematology & Oncology

## 2023-11-29 VITALS — BP 170/56 | HR 96 | Temp 97.9°F | Resp 19 | Ht 60.0 in | Wt 166.0 lb

## 2023-11-29 DIAGNOSIS — C20 Malignant neoplasm of rectum: Secondary | ICD-10-CM

## 2023-11-29 DIAGNOSIS — D500 Iron deficiency anemia secondary to blood loss (chronic): Secondary | ICD-10-CM

## 2023-11-29 DIAGNOSIS — Z860100 Personal history of colon polyps, unspecified: Secondary | ICD-10-CM

## 2023-11-29 DIAGNOSIS — D509 Iron deficiency anemia, unspecified: Secondary | ICD-10-CM

## 2023-11-29 DIAGNOSIS — Z95828 Presence of other vascular implants and grafts: Secondary | ICD-10-CM

## 2023-11-29 DIAGNOSIS — K625 Hemorrhage of anus and rectum: Secondary | ICD-10-CM

## 2023-11-29 DIAGNOSIS — Z1509 Genetic susceptibility to other malignant neoplasm: Secondary | ICD-10-CM

## 2023-11-29 DIAGNOSIS — K922 Gastrointestinal hemorrhage, unspecified: Secondary | ICD-10-CM

## 2023-11-29 LAB — CEA (ACCESS): CEA (CHCC): 1.04 ng/mL (ref 0.00–5.00)

## 2023-11-29 LAB — CMP (CANCER CENTER ONLY)
ALT: 22 U/L (ref 0–44)
AST: 18 U/L (ref 15–41)
Albumin: 4.5 g/dL (ref 3.5–5.0)
Alkaline Phosphatase: 68 U/L (ref 38–126)
Anion gap: 11 (ref 5–15)
BUN: 14 mg/dL (ref 8–23)
CO2: 29 mmol/L (ref 22–32)
Calcium: 9.4 mg/dL (ref 8.9–10.3)
Chloride: 94 mmol/L — ABNORMAL LOW (ref 98–111)
Creatinine: 0.85 mg/dL (ref 0.44–1.00)
GFR, Estimated: >60 mL/min (ref >60)
Glucose, Bld: 135 mg/dL — ABNORMAL HIGH (ref 70–99)
Potassium: 3.4 mmol/L — ABNORMAL LOW (ref 3.5–5.1)
Sodium: 134 mmol/L — ABNORMAL LOW (ref 135–145)
Total Bilirubin: 0.7 mg/dL (ref 0.0–1.2)
Total Protein: 7 g/dL (ref 6.5–8.1)

## 2023-11-29 LAB — CBC WITH DIFFERENTIAL (CANCER CENTER ONLY)
Abs Immature Granulocytes: 0.04 10*3/uL (ref 0.00–0.07)
Basophils Absolute: 0.1 10*3/uL (ref 0.0–0.1)
Basophils Relative: 1 %
Eosinophils Absolute: 0.2 10*3/uL (ref 0.0–0.5)
Eosinophils Relative: 2 %
HCT: 41.1 % (ref 36.0–46.0)
Hemoglobin: 14.1 g/dL (ref 12.0–15.0)
Immature Granulocytes: 0 %
Lymphocytes Relative: 42 %
Lymphs Abs: 3.9 10*3/uL (ref 0.7–4.0)
MCH: 30.7 pg (ref 26.0–34.0)
MCHC: 34.3 g/dL (ref 30.0–36.0)
MCV: 89.3 fL (ref 80.0–100.0)
Monocytes Absolute: 0.8 10*3/uL (ref 0.1–1.0)
Monocytes Relative: 9 %
Neutro Abs: 4.3 10*3/uL (ref 1.7–7.7)
Neutrophils Relative %: 46 %
Platelet Count: 244 10*3/uL (ref 150–400)
RBC: 4.6 MIL/uL (ref 3.87–5.11)
RDW: 14.5 % (ref 11.5–15.5)
WBC Count: 9.2 10*3/uL (ref 4.0–10.5)
nRBC: 0 % (ref 0.0–0.2)

## 2023-11-29 LAB — FERRITIN: Ferritin: 196 ng/mL (ref 11–307)

## 2023-11-29 MED ORDER — HEPARIN SOD (PORK) LOCK FLUSH 100 UNIT/ML IV SOLN
500.0000 [IU] | Freq: Once | INTRAVENOUS | Status: AC
  Administered 2023-11-29: 500 [IU] via INTRAVENOUS

## 2023-11-29 MED ORDER — SODIUM CHLORIDE 0.9% FLUSH
10.0000 mL | INTRAVENOUS | Status: DC | PRN
Start: 2023-11-29 — End: 2023-11-29
  Administered 2023-11-29: 10 mL via INTRAVENOUS

## 2023-11-29 NOTE — Progress Notes (Signed)
 Hematology and Oncology Follow Up Visit  [name] [id number] 1929-08-06 89 y.o. 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 3 weeks-s/p cycle #1  -- start on 02/09/2023 -- d/c on 04/12/2023 Xeloda 1500 mg po BID (14/7) -- s/p cycle #6 on 02/13/2023 -- changed to 1000mg  po BID on 08/31/2023 --on hold since 08/2023      Interim History:  Ms. Luse is back for follow-up.  She actually looks quite good.  She has to go to have a colonoscopy next Friday.  She still having some intermittent rectal bleeding.  She is not anemic.  She is not iron deficient so hopefully, this is just a hemorrhoidal issue.  Her last CEA back in February was 1.11.  She has had no cough or shortness of breath.  Patient had a CT scan done 10 days ago.  Unfortunate, there is also not yet back.  She has had no rash.  She has had no leg swelling.  She has had no weight loss.  She is worried about little weight gain.  So far, her heart has been doing pretty well.  She is on amiodarone.   Currently, I would say that her performance status is probably ECOG 2.   Medications:  Current Outpatient Medications:    acetaminophen (TYLENOL) 325 MG tablet, Take 2 tablets (650 mg total) by mouth every 6 (six) hours as needed for mild pain (or Fever >/= 101)., Disp: 30 tablet, Rfl: 0   ALPRAZolam (XANAX) 0.5 MG tablet, Take 0.5 mg by mouth 3 (three) times daily as needed for anxiety. For anxiety, Disp: , Rfl:    amiodarone (PACERONE) 200 MG tablet, Take 0.5 tablets (100 mg total) by mouth daily., Disp: 45 tablet, Rfl: 2   amLODipine (NORVASC) 5 MG tablet, Take 5 mg by mouth in the morning and at bedtime., Disp: , Rfl:    cholecalciferol (VITAMIN D3) 25 MCG (1000 UNIT) tablet, Take 1,000 Units by mouth daily., Disp: , Rfl:    clotrimazole (LOTRIMIN) 1 % cream, Apply 1 Application topically 2 (two) times daily.,  Disp: 30 g, Rfl: 0   cyanocobalamin 1000 MCG tablet, Take 1 tablet (1,000 mcg total) by mouth daily., Disp: 30 tablet, Rfl: 0   escitalopram (LEXAPRO) 5 MG tablet, Take 5 mg by mouth at bedtime., Disp: , Rfl:    famotidine (PEPCID) 20 MG tablet, Take 20 mg by mouth daily., Disp: , Rfl:    feeding supplement (ENSURE ENLIVE / ENSURE PLUS) LIQD, Take 237 mLs by mouth 3 (three) times daily with meals. (Patient taking differently: Take 237 mLs by mouth 3 (three) times daily as needed (nutrition).), Disp: 237 mL, Rfl: 12   hydrALAZINE (APRESOLINE) 25 MG tablet, Take by mouth daily., Disp: , Rfl:    levothyroxine (SYNTHROID) 175 MCG tablet, Take 175 mcg by mouth every morning., Disp: , Rfl:    lidocaine-prilocaine (EMLA) cream, Apply 1 Application topically as needed., Disp: 30 g, Rfl: 4   loratadine (CLARITIN) 10 MG tablet, Take 1 tablet (10 mg total) by mouth daily., Disp: 30 tablet, Rfl: 0   pantoprazole (PROTONIX) 40 MG tablet, Take 40 mg by mouth daily., Disp: , Rfl:    potassium chloride SA (K-DUR,KLOR-CON) 20 MEQ tablet, Take 20 mEq by mouth daily., Disp: , Rfl:    prochlorperazine (COMPAZINE) 10 MG tablet, Take 1 tablet (10 mg total) by mouth every 6 (six) hours as needed.,  Disp: 30 tablet, Rfl: 1   triamterene-hydrochlorothiazide (MAXZIDE-25) 37.5-25 MG tablet, Take 1 tablet by mouth daily., Disp: 90 tablet, Rfl: 1   TRUE METRIX BLOOD GLUCOSE TEST test strip, , Disp: , Rfl:    capecitabine (XELODA) 500 MG tablet, Take 3 tablets (1,500 mg total) by mouth 2 (two) times daily after a meal. Take within 30 minutes after meals. Take for 14 days on, 7 days off. Repeat every 21 days. (Patient not taking: Reported on 11/29/2023), Disp: 84 tablet, Rfl: 5   fluticasone (FLONASE) 50 MCG/ACT nasal spray, Place 2 sprays into both nostrils daily. (Patient not taking: Reported on 09/20/2023), Disp: 11.1 mL, Rfl: 0   HYDROcodone-acetaminophen (NORCO/VICODIN) 5-325 MG tablet, Take 1-2 tablets by mouth every 4 (four)  hours as needed for moderate pain. (Patient not taking: Reported on 09/20/2023), Disp: 30 tablet, Rfl: 0   methocarbamol (ROBAXIN) 500 MG tablet, Take 1 tablet (500 mg total) by mouth every 8 (eight) hours as needed for muscle spasms (abd pain). (Patient not taking: Reported on 09/20/2023), Disp: 10 tablet, Rfl: 0   polyethylene glycol (MIRALAX / GLYCOLAX) 17 g packet, Take 17 g by mouth daily as needed for mild constipation. (Patient not taking: Reported on 11/29/2023), Disp: 14 each, Rfl: 0  Allergies:  Allergies  Allergen Reactions   Aggrenox [Aspirin-Dipyridamole Er] Other (See Comments)    Severe Bleeding and Stomach Pain   Prednisone Other (See Comments)    Climbs the walls    Past Medical History, Surgical history, Social history, and Family History were reviewed and updated.  Review of Systems: Review of Systems  Constitutional:  Positive for fatigue.  HENT:  Negative.    Eyes: Negative.   Respiratory: Negative.    Cardiovascular:  Positive for palpitations.  Gastrointestinal:  Positive for blood in stool and rectal pain.  Endocrine: Negative.   Genitourinary: Negative.    Musculoskeletal: Negative.   Skin: Negative.   Neurological:  Positive for light-headedness.  Hematological: Negative.   Psychiatric/Behavioral: Negative.      Physical Exam: Her vital signs show temperature of 97.9.  Pulse 96.  Blood pressure 170/56.  Weight is 166 pounds.      Wt Readings from Last 3 Encounters:  11/29/23 166 lb (75.3 kg)  10/19/23 159 lb (72.1 kg)  08/31/23 157 lb (71.2 kg)    Physical Exam Vitals reviewed.  HENT:     Head: Normocephalic and atraumatic.  Eyes:     Pupils: Pupils are equal, round, and reactive to light.  Cardiovascular:     Rate and Rhythm: Normal rate and regular rhythm.     Heart sounds: Normal heart sounds.     Comments: Cardiac exam is regular rate and rhythm.  She has no murmurs, rubs or bruits. Pulmonary:     Effort: Pulmonary effort is normal.      Breath sounds: Normal breath sounds.     Comments: She has good air movement bilaterally.  She has some crackles bilaterally.  I hear no wheezes. Abdominal:     General: Bowel sounds are normal.     Palpations: Abdomen is soft.  Musculoskeletal:        General: No tenderness or deformity. Normal range of motion.     Cervical back: Normal range of motion.  Lymphadenopathy:     Cervical: No cervical adenopathy.  Skin:    General: Skin is warm and dry.     Findings: No erythema or rash.  Neurological:     Mental Status: She  is alert and oriented to person, place, and time.  Psychiatric:        Behavior: Behavior normal.        Thought Content: Thought content normal.        Judgment: Judgment normal.     Lab Results  Component Value Date   WBC 9.2 11/29/2023   HGB 14.1 11/29/2023   HCT 41.1 11/29/2023   MCV 89.3 11/29/2023   PLT 244 11/29/2023     Chemistry      Component Value Date/Time   NA 134 (L) 11/29/2023 1521   K 3.4 (L) 11/29/2023 1521   CL 94 (L) 11/29/2023 1521   CO2 29 11/29/2023 1521   BUN 14 11/29/2023 1521   CREATININE 0.85 11/29/2023 1521      Component Value Date/Time   CALCIUM 9.4 11/29/2023 1521   ALKPHOS 68 11/29/2023 1521   AST 18 11/29/2023 1521   ALT 22 11/29/2023 1521   BILITOT 0.7 11/29/2023 1521      Impression and Plan: Ms. Pippins is a very charming 89 year old white female.  She has at least a locally advanced adenocarcinoma of the rectum.-I would have to say that she has had metastatic disease since the recent CT scan that she had done did not show any evidence of the pulmonary nodule.  It would be very interesting to see what the colonoscopy has to show.  Her last rectal MRI was done back in January.  We probably have to think about doing another 1 sometime in the Spring.  Again, very happy that she is going to have the colonoscopy.  I would like to see her back in about 6 weeks.  Hopefully, we will not see that she has any thing  active with respect to her cancer.   Josph Macho, MD 3/20/20253:55 PM

## 2023-11-29 NOTE — Progress Notes (Signed)
 BP remains elevated, 170/56, instructed to monitor at home and keep diary to show physician. MD has added new BP med.  Verbalized understanding.

## 2023-11-30 ENCOUNTER — Encounter: Payer: Self-pay | Admitting: Hematology & Oncology

## 2023-11-30 LAB — IRON AND IRON BINDING CAPACITY (CC-WL,HP ONLY)
Iron: 81 ug/dL (ref 28–170)
Saturation Ratios: 22 % (ref 10.4–31.8)
TIBC: 367 ug/dL (ref 250–450)
UIBC: 286 ug/dL (ref 148–442)

## 2023-12-03 ENCOUNTER — Other Ambulatory Visit: Payer: Self-pay

## 2023-12-04 ENCOUNTER — Other Ambulatory Visit: Payer: Self-pay

## 2023-12-04 NOTE — Progress Notes (Signed)
 Per Dr. Gustavo Lah nurse Elmarie Shiley, Xeloda is discontinued. Disenrolled.

## 2023-12-13 NOTE — Progress Notes (Unsigned)
  Cardiology Office Note:  .   Date:  12/14/2023  ID:  [name], DOB 11/03/1928, MRN [id number] PCP: Creola Corn, MD  [facility] Providers Cardiologist:     History of Present Illness: .   [name] is a 89 y.o. female with hx of anemia, colon cancer, prolonged sinus pauses  Has episodes of PAF. Has long post conversion pauses up to 10 seconds when she converts to sinus rhythm  Is not a candidate for anticoagulation   Echo :  normal LV systolic functio with EF 60-65% Mild MR  mild AS with mean AV gradient of 10 mm Hg   She has not had any recent rectal bleeding    December 14, 2023 [name] is seen for follow up of her anemia, colon cancer, PAF, hx of prolonged sinus pauses  She is not a candidate for anticoagulation .   Bp has been running high Still eats some processed foods.  Still eating fast foods.   No Cp , no dyspnea     ROS:   Studies Reviewed: .         Risk Assessment/Calculations:    CHA2DS2-VASc Score = 8   This indicates a 10.8% annual risk of stroke. The patient's score is based upon: CHF History: 0 HTN History: 1 Diabetes History: 1 Stroke History: 2 Vascular Disease History: 1 Age Score: 2 Gender Score: 1     Physical Exam:     Physical Exam: Blood pressure 135/78, pulse 89, height 5' (1.524 m), weight 166 lb 3.2 oz (75.4 kg), SpO2 93%.       GEN:  elderly female ,  NAD  in no acute distress HEENT: Normal NECK: No JVD; left carotid bruit versus systolic murmur radiating into her left carotid LYMPHATICS: No lymphadenopathy CARDIAC: RRR, 2/6 systolic ejection murmur. RESPIRATORY:  Clear to auscultation without rales, wheezing or rhonchi  ABDOMEN: Soft, non-tender, non-distended MUSCULOSKELETAL:  No edema; No deformity  SKIN: Warm and dry NEUROLOGIC:  Alert and oriented x 3   ASSESSMENT AND PLAN: .     1.  Paroxysmal atrial fibrillation:  Unable to take anticoagulation Has maintained NSR . Cont current meds         2.  Hypertension: Blood pressure overall is fairly well-controlled. Slightly elevated at times Increase hydralazine to 25 mg BID  3.  HLD :  managed by Dr. Timothy Lasso   4.  Aortic stenosis: Stable.    Dispo: 1 year    Signed, Kristeen Miss, MD

## 2023-12-14 ENCOUNTER — Ambulatory Visit: Payer: Medicare Other | Attending: Internal Medicine | Admitting: Cardiovascular Disease

## 2023-12-14 ENCOUNTER — Encounter: Payer: Self-pay | Admitting: Hematology & Oncology

## 2023-12-14 ENCOUNTER — Encounter: Payer: Self-pay | Admitting: Cardiovascular Disease

## 2023-12-14 VITALS — BP 135/78 | HR 89 | Ht 60.0 in | Wt 166.2 lb

## 2023-12-14 DIAGNOSIS — I350 Nonrheumatic aortic (valve) stenosis: Secondary | ICD-10-CM

## 2023-12-14 DIAGNOSIS — I480 Paroxysmal atrial fibrillation: Secondary | ICD-10-CM

## 2023-12-14 DIAGNOSIS — I10 Essential (primary) hypertension: Secondary | ICD-10-CM | POA: Diagnosis not present

## 2023-12-14 MED ORDER — HYDRALAZINE HCL 25 MG PO TABS: 25.0000 mg | ORAL_TABLET | Freq: Two times a day (BID) | ORAL | 3 refills | Status: AC

## 2023-12-14 NOTE — Patient Instructions (Signed)
 Medication Instructions:  INCREASE Hydralazine to 25mg  twice daily *If you need a refill on your cardiac medications before your next appointment, please call your pharmacy*  Follow-Up: At [facility], you and your health needs are our priority.  As part of our continuing mission to provide you with exceptional heart care, our providers are all part of one team.  This team includes your primary Cardiologist (physician) and Advanced Practice Providers or APPs (Physician Assistants and Nurse Practitioners) who all work together to provide you with the care you need, when you need it.  Your next appointment:   1 year(s)  Provider:   Kristeen Miss, MD     1st Floor: - Lobby - Registration  - Pharmacy  - Lab - Cafe  2nd Floor: - PV Lab - Diagnostic Testing (echo, CT, nuclear med)  3rd Floor: - Vacant  4th Floor: - TCTS (cardiothoracic surgery) - AFib Clinic - Structural Heart Clinic - Vascular Surgery  - Vascular Ultrasound  5th Floor: - HeartCare Cardiology (general and EP) - Clinical Pharmacy for coumadin, hypertension, lipid, weight-loss medications, and med management appointments    Valet parking services will be available as well.

## 2023-12-17 ENCOUNTER — Telehealth: Payer: Self-pay

## 2023-12-17 ENCOUNTER — Ambulatory Visit (AMBULATORY_SURGERY_CENTER)

## 2023-12-17 VITALS — Ht 60.0 in | Wt 166.0 lb

## 2023-12-17 DIAGNOSIS — Z85038 Personal history of other malignant neoplasm of large intestine: Secondary | ICD-10-CM

## 2023-12-17 DIAGNOSIS — K625 Hemorrhage of anus and rectum: Secondary | ICD-10-CM

## 2023-12-17 NOTE — Telephone Encounter (Signed)
 I have reviewed the chart, can be done in [facility] as before We will wait for [name] to weigh in RG

## 2023-12-17 NOTE — Progress Notes (Signed)
 No egg or soy allergy known to patient  No issues known to pt with past sedation with any surgeries or procedures Patient denies ever being told they had issues or difficulty with intubation  No FH of Malignant Hyperthermia Pt is not on diet pills Pt is not on  home 02  Pt is not on blood thinners  Pt denies issues with constipation  Hx of A fib (clearance request sent to [facility], CRNA Have any cardiac testing pending--No Pt can ambulate  Pt denies use of chewing tobacco Discussed diabetic I weight loss medication holds Discussed NSAID holds Checked BMI Pt instructed to use [employer] or GoodRx for a price reduction on prep   Pre visit completed   Patient's son, [name], states he was under the impression Flex Sig would be done at [facility].  It is currently scheduled at [facility] on 01-03-24 with Dr. Chales Abrahams. RN will forward a message to Dr. Chales Abrahams and anesthesia verifying they are clearing patient for [facility] procedure.

## 2023-12-17 NOTE — Telephone Encounter (Signed)
 Hello Dr. Chales Abrahams and Jonny Ruiz,  I completed this patient's pre-visit today and her son was under the impression the Flex Sig was to be done at the hospital.  It is currently scheduled @[facility]  on 01/03/24. Please verifty [facility] is correct.  John,  Please review for clearance at [facility] if appropriate (89yo, hx of A-fib, stroke, etc). Thank you, Ameena Vesey

## 2023-12-19 ENCOUNTER — Telehealth: Payer: Self-pay | Admitting: Gastroenterology

## 2023-12-19 NOTE — Telephone Encounter (Signed)
 Inbound call from patients son requesting a call from someone from prior authorization to discuss insurance information regarding her Flex Sig at 9:00. Please advise.

## 2023-12-19 NOTE — Telephone Encounter (Signed)
 TC to patient and message left stating that per Dr. Chales Abrahams and anesthesia, procedure can be done at [facility]. SChaplin, RN,BSN

## 2023-12-21 ENCOUNTER — Encounter: Payer: Self-pay | Admitting: Hematology & Oncology

## 2023-12-24 ENCOUNTER — Ambulatory Visit (HOSPITAL_COMMUNITY)
Admission: RE | Admit: 2023-12-24 | Discharge: 2023-12-24 | Disposition: A | Discharge status: Home / Self Care | Payer: 59 | Source: Ambulatory Visit | Attending: Hematology & Oncology | Admitting: Hematology & Oncology

## 2023-12-24 DIAGNOSIS — C20 Malignant neoplasm of rectum: Secondary | ICD-10-CM | POA: Insufficient documentation

## 2023-12-26 ENCOUNTER — Encounter: Payer: Self-pay | Admitting: Hematology & Oncology

## 2023-12-27 ENCOUNTER — Encounter: Payer: Self-pay | Admitting: Hematology & Oncology

## 2023-12-27 ENCOUNTER — Other Ambulatory Visit: Payer: Self-pay | Admitting: Hematology & Oncology

## 2023-12-27 DIAGNOSIS — C20 Malignant neoplasm of rectum: Secondary | ICD-10-CM

## 2023-12-27 NOTE — Telephone Encounter (Signed)
 Patient sone requesting f/u call to advise on previous note. Please advise. Patient is scheduled for 4/24.

## 2023-12-28 ENCOUNTER — Encounter: Payer: Self-pay | Admitting: Hematology & Oncology

## 2023-12-31 ENCOUNTER — Encounter: Payer: Self-pay | Admitting: Hematology & Oncology

## 2023-12-31 NOTE — Telephone Encounter (Signed)
Patient calling in regards to previous note. Please advise.

## 2024-01-01 ENCOUNTER — Encounter: Payer: Self-pay | Admitting: Hematology & Oncology

## 2024-01-03 ENCOUNTER — Encounter: Payer: Self-pay | Admitting: Hematology & Oncology

## 2024-01-03 ENCOUNTER — Ambulatory Visit: Admitting: Gastroenterology

## 2024-01-03 ENCOUNTER — Encounter: Payer: Self-pay | Admitting: Gastroenterology

## 2024-01-03 ENCOUNTER — Encounter: Admitting: Gastroenterology

## 2024-01-03 VITALS — BP 125/56 | HR 65 | Temp 98.0°F | Resp 14 | Ht 60.0 in | Wt 166.0 lb

## 2024-01-03 DIAGNOSIS — K5730 Diverticulosis of large intestine without perforation or abscess without bleeding: Secondary | ICD-10-CM | POA: Diagnosis not present

## 2024-01-03 DIAGNOSIS — C20 Malignant neoplasm of rectum: Secondary | ICD-10-CM

## 2024-01-03 DIAGNOSIS — K625 Hemorrhage of anus and rectum: Secondary | ICD-10-CM

## 2024-01-03 MED ORDER — SODIUM CHLORIDE 0.9 % IV SOLN: 500.0000 mL | Freq: Once | INTRAVENOUS | Status: DC

## 2024-01-03 NOTE — Progress Notes (Signed)
 Pt's states no medical or surgical changes since previsit or office visit. Daughter in law ar bedside and helping answering check in questions.

## 2024-01-03 NOTE — Progress Notes (Signed)
 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   Mucous retention cyst of maxillary sinus    Osteoporosis    Pinched nerve In neck   Plantar fasciitis    Stroke [facility])    Vitamin D deficiency     Past Surgical History:  Procedure Laterality Date   APPENDECTOMY     BIOPSY  01/19/2023   Procedure: BIOPSY;  Surgeon: Nannette Babe, MD;  Location: [facility];  Service: Gastroenterology;;   BIOPSY  03/21/2023   Procedure: BIOPSY;  Surgeon: Lajuan Pila, MD;  Location: [facility];  Service: Gastroenterology;;   BIOPSY  03/22/2023   Procedure: BIOPSY;  Surgeon: Lajuan Pila, MD;  Location: [facility];  Service: Gastroenterology;;   CHOLECYSTECTOMY     2004   COLONOSCOPY N/A 02/24/2013   Procedure: COLONOSCOPY;  Surgeon: Asencion Blacksmith, MD;  Location: [facility]OSCOPY;  Service: Endoscopy;  Laterality: N/A;   COLONOSCOPY W/ POLYPECTOMY     COLONOSCOPY WITH PROPOFOL  N/A 12/03/2017   Procedure: COLONOSCOPY WITH PROPOFOL ;  Surgeon: Janel Medford, MD;  Location: [facility] ENDOSCOPY;  Service: Gastroenterology;  Laterality: N/A;   ESOPHAGOGASTRODUODENOSCOPY     ESOPHAGOGASTRODUODENOSCOPY N/A 02/24/2013   Procedure: ESOPHAGOGASTRODUODENOSCOPY (EGD);  Surgeon: Asencion Blacksmith, MD;  Location: [facility];  Service: Endoscopy;  Laterality: N/A;    ESOPHAGOGASTRODUODENOSCOPY (EGD) WITH PROPOFOL  N/A 12/03/2017   Procedure: ESOPHAGOGASTRODUODENOSCOPY (EGD) WITH PROPOFOL ;  Surgeon: Janel Medford, MD;  Location[facility] ENDOSCOPY;  Service: Gastroenterology;  Laterality: N/A;   ESOPHAGOGASTRODUODENOSCOPY (EGD) WITH PROPOFOL  N/A 03/21/2023   Procedure: ESOPHAGOGASTRODUODENOSCOPY (EGD) WITH PROPOFOL ;  Surgeon: Lajuan Pila, MD;  Location: [facility]OSCOPY;  Service: Gastroenterology;  Laterality: N/A;   EYE SURGERY     bilateral catracts   FLEXIBLE SIGMOIDOSCOPY N/A 01/19/2023   Procedure: FLEXIBLE SIGMOIDOSCOPY;  Surgeon: Nannette Babe, MD;  Location: [facility];  Service: Gastroenterology;  Laterality: N/A;   FLEXIBLE SIGMOIDOSCOPY N/A 03/22/2023   Procedure: FLEXIBLE SIGMOIDOSCOPY;  Surgeon: Lajuan Pila, MD;  Location: [facility]OSCOPY;  Service: Gastroenterology;  Laterality: N/A;   HOT HEMOSTASIS N/A 03/21/2023   Procedure: HOT HEMOSTASIS (ARGON PLASMA COAGULATION/BICAP);  Surgeon: Lajuan Pila, MD;  Location: [facility];  Service: Gastroenterology;  Laterality: N/A;   IR IMAGING GUIDED PORT INSERTION  03/26/2023    Prior to Admission medications   Medication Sig Start Date End Date Taking? Authorizing Provider  acetaminophen  (TYLENOL ) 325 MG tablet Take 2 tablets (650 mg total) by mouth every 6 (six) hours as needed for mild pain (or Fever >/= 101). 01/28/23  Yes Haydee Lipa, MD  ALPRAZolam  (XANAX ) 0.5 MG tablet Take 0.5 mg by mouth 3 (three) times daily as needed for anxiety. For anxiety  Yes [provider]  amiodarone  (PACERONE ) 200 MG tablet Take 0.5 tablets (100 mg total) by mouth daily. 08/21/23  Yes Nahser, Lela Purple, MD  amLODipine  (NORVASC ) 5 MG tablet Take 5 mg by mouth in the morning and at bedtime.   Yes [provider]  cholecalciferol (VITAMIN D3) 25 MCG (1000 UNIT) tablet Take 1,000 Units by mouth daily.   Yes [provider]  cyanocobalamin  1000 MCG tablet Take 1 tablet (1,000 mcg total) by mouth  daily. 01/28/23  Yes Haydee Lipa, MD  escitalopram  (LEXAPRO ) 5 MG tablet Take 5 mg by mouth at bedtime.   Yes [provider]  famotidine (PEPCID) 20 MG tablet Take 20 mg by mouth daily.   Yes [provider]  feeding supplement (ENSURE ENLIVE / ENSURE PLUS) LIQD Take 237 mLs by mouth 3 (three) times daily with meals. Patient taking differently: Take 237 mLs by mouth 3 (three) times daily as needed (nutrition). 01/28/23  Yes Haydee Lipa, MD  hydrALAZINE  (APRESOLINE ) 25 MG tablet Take 1 tablet (25 mg total) by mouth 2 (two) times daily. 12/14/23  Yes Nahser, Lela Purple, MD  levothyroxine  (SYNTHROID ) 175 MCG tablet Take 175 mcg by mouth every morning. 04/27/23  Yes [provider]  loratadine  (CLARITIN ) 10 MG tablet Take 1 tablet (10 mg total) by mouth daily. 01/28/23  Yes Haydee Lipa, MD  polyethylene glycol (MIRALAX  / GLYCOLAX ) 17 g packet Take 17 g by mouth daily as needed for mild constipation. 01/28/23  Yes Haydee Lipa, MD  potassium chloride  SA (K-DUR,KLOR-CON ) 20 MEQ tablet Take 20 mEq by mouth daily.   Yes [provider]  prochlorperazine  (COMPAZINE ) 10 MG tablet Take 1 tablet (10 mg total) by mouth every 6 (six) hours as needed. 09/20/23  Yes Ivor Mars, MD  triamterene -hydrochlorothiazide  (MAXZIDE-25) 37.5-25 MG tablet Take 1 tablet by mouth daily. 08/20/23  Yes Ivor Mars, MD  clotrimazole  (LOTRIMIN ) 1 % cream Apply 1 Application topically 2 (two) times daily. Patient not taking: Reported on 01/03/2024 08/20/23   Ivor Mars, MD  fluticasone  (FLONASE ) 50 MCG/ACT nasal spray Place 2 sprays into both nostrils daily. 01/28/23   Haydee Lipa, MD  lidocaine -prilocaine  (EMLA ) cream Apply 1 Application topically as needed. 08/20/23   Ivor Mars, MD  TRUE METRIX BLOOD GLUCOSE TEST test strip  03/12/23   [provider]    Current Outpatient Medications  Medication Sig Dispense Refill   acetaminophen   (TYLENOL ) 325 MG tablet Take 2 tablets (650 mg total) by mouth every 6 (six) hours as needed for mild pain (or Fever >/= 101). 30 tablet 0   ALPRAZolam  (XANAX ) 0.5 MG tablet Take 0.5 mg by mouth 3 (three) times daily as needed for anxiety. For anxiety     amiodarone  (PACERONE ) 200 MG tablet Take 0.5 tablets (100 mg total) by mouth daily. 45 tablet 2   amLODipine  (NORVASC ) 5 MG tablet Take 5 mg by mouth in the morning and at bedtime.     cholecalciferol (VITAMIN D3) 25 MCG (1000 UNIT) tablet Take 1,000 Units by mouth daily.     cyanocobalamin  1000 MCG tablet Take 1 tablet (1,000 mcg total) by mouth daily. 30 tablet 0   escitalopram  (LEXAPRO ) 5 MG tablet Take 5 mg by mouth at bedtime.     famotidine (PEPCID) 20 MG tablet Take 20 mg by mouth daily.     feeding supplement (ENSURE ENLIVE / ENSURE PLUS) LIQD Take 237 mLs by mouth 3 (three)  times daily with meals. (Patient taking differently: Take 237 mLs by mouth 3 (three) times daily as needed (nutrition).) 237 mL 12   hydrALAZINE  (APRESOLINE ) 25 MG tablet Take 1 tablet (25 mg total) by mouth 2 (two) times daily. 180 tablet 3   levothyroxine  (SYNTHROID ) 175 MCG tablet Take 175 mcg by mouth every morning.     loratadine  (CLARITIN ) 10 MG tablet Take 1 tablet (10 mg total) by mouth daily. 30 tablet 0   polyethylene glycol (MIRALAX  / GLYCOLAX ) 17 g packet Take 17 g by mouth daily as needed for mild constipation. 14 each 0   potassium chloride  SA (K-DUR,KLOR-CON ) 20 MEQ tablet Take 20 mEq by mouth daily.     prochlorperazine  (COMPAZINE ) 10 MG tablet Take 1 tablet (10 mg total) by mouth every 6 (six) hours as needed. 30 tablet 1   triamterene -hydrochlorothiazide  (MAXZIDE-25) 37.5-25 MG tablet Take 1 tablet by mouth daily. 90 tablet 1   clotrimazole  (LOTRIMIN ) 1 % cream Apply 1 Application topically 2 (two) times daily. (Patient not taking: Reported on 01/03/2024) 30 g 0   fluticasone  (FLONASE ) 50 MCG/ACT nasal spray Place 2 sprays into both nostrils daily. 11.1  mL 0   lidocaine -prilocaine  (EMLA ) cream Apply 1 Application topically as needed. 30 g 4   TRUE METRIX BLOOD GLUCOSE TEST test strip  (Patient not taking: Reported on 12/17/2023)     Current Facility-Administered Medications  Medication Dose Route Frequency Provider Last Rate Last Admin   0.9 %  sodium chloride  infusion  500 mL Intravenous Once Lajuan Pila, MD        Allergies as of 01/03/2024 - Review Complete 01/03/2024  Allergen Reaction Noted   Aggrenox  [aspirin -dipyridamole  er] Other (See Comments) 03/19/2023   Prednisone  Other (See Comments) 09/27/2011    Family History  Problem Relation Age of Onset   Colon polyps Son    Colon cancer Son    Diabetes Other    Esophageal cancer Neg Hx    Stomach cancer Neg Hx    Rectal cancer Neg Hx     Social History   Socioeconomic History   Marital status: Widowed    Spouse name: Not on file   Number of children: 3   Years of education: Not on file   Highest education level: Not on file  Occupational History   Occupation: retired  Tobacco Use   Smoking status: Former    Current packs/day: 0.00    Types: Cigarettes    Start date: 1951    Quit date: 1995    Years since quitting: 30.3   Smokeless tobacco: Never   Tobacco comments:    Quit about 30 years ago  Vaping Use   Vaping status: Never Used  Substance and Sexual Activity   Alcohol  use: No   Drug use: No   Sexual activity: Not Currently  Other Topics Concern   Not on file  Social History Narrative   Not on file   Social Drivers of Health   Financial Resource Strain: Medium Risk (02/23/2023)   Overall Financial Resource Strain (CARDIA)    Difficulty of Paying Living Expenses: Somewhat hard  Food Insecurity: No Food Insecurity (03/20/2023)   Hunger Vital Sign    Worried About Running Out of Food in the Last Year: Never true    Ran Out of Food in the Last Year: Never true  Transportation Needs: No Transportation Needs (03/20/2023)   PRAPARE - Transportation    Lack  of Transportation (Medical): No    Lack  of Transportation (Non-Medical): No  Physical Activity: Not on file  Stress: Not on file  Social Connections: Not on file  Intimate Partner Violence: Not At Risk (03/20/2023)   Humiliation, Afraid, Rape, and Kick questionnaire    Fear of Current or Ex-Partner: No    Emotionally Abused: No    Physically Abused: No    Sexually Abused: No    Review of Systems: Positive for none except continued bleeding All other review of systems negative except as mentioned in the HPI.  Physical Exam: Vital signs in last 24 hours: @VSRANGES @   General:   Alert,  Well-developed, well-nourished, pleasant and cooperative in NAD Lungs:  Clear throughout to auscultation.   Heart:  Regular rate and rhythm; no murmurs, clicks, rubs,  or gallops. Abdomen:  Soft, nontender and nondistended. Normal bowel sounds.   Neuro/Psych:  Alert and cooperative. Normal mood and affect. A and O x 3    No significant changes were identified.  The patient continues to be an appropriate candidate for the planned procedure and anesthesia.   Magnus Schuller, MD. [facility] 01/03/2024 9:51 AM@

## 2024-01-03 NOTE — Patient Instructions (Signed)

## 2024-01-03 NOTE — Op Note (Signed)
  [facility] Patient Name: [name] Procedure Date: 01/03/2024 9:56 AM MRN: [id number] Endoscopist: Lajuan Pila , MD, [id number] Age: 89 Referring MD:  Date of Birth: 1929-02-25 Gender: Female Account #: [id number] Procedure:                Flexible Sigmoidoscopy Indications:              Rectal cancer, Dx on FS 12/2022 Medicines:                Propofol  per Anesthesia Procedure:                Pre-Anesthesia Assessment:                           - Prior to the procedure, a History and Physical                            was performed, and patient medications and                            allergies were reviewed. The patient's tolerance of                            previous anesthesia was also reviewed. The risks                            and benefits of the procedure and the sedation                            options and risks were discussed with the patient.                            All questions were answered, and informed consent                            was obtained. Prior Anticoagulants: The patient has                            taken no anticoagulant or antiplatelet agents. ASA                            Grade Assessment: III - A patient with severe                            systemic disease. After reviewing the risks and                            benefits, the patient was deemed in satisfactory                            condition to undergo the procedure.                           After obtaining informed consent, the scope was  passed under direct vision. The [id number]-[id number]                            Colonoscope [id number] was introduced through the anus                            and advanced to the the sigmoid colon. The flexible                            sigmoidoscopy was accomplished without difficulty.                            The patient tolerated the procedure well. The                            quality of the bowel  preparation was good. Scope In: 10:00:26 AM Scope Out: 10:06:57 AM Total Procedure Duration: 0 hours 6 minutes 31 seconds  Findings:                 A 5 cm x 4 cm fungating, infiltrative, polypoid and                            ulcerated non-obstructing medium-sized mass was                            found in the distal rectum (left ant/lat.post) with                            contact oozing on the flex sig and rectal exam. It                            did involve the dentate line. Unfortunately, it is                            increased in size as compared to the previous                            flexible sigmoidoscopy and now occupies one half of                            the circumference. This was biopsied with a cold                            forceps for histology. Estimated blood loss was                            minimal.                           A few medium-mouthed diverticula were found in the                            sigmoid colon. Complications:  No immediate complications. Estimated Blood Loss:     Estimated blood loss: none. Impression:               - Malignant tumor in the distal rectum. Biopsied.                            Unfortunately, it is increased in size as compared                            to the previous flex sig 03/2023.                           - Diverticulosis in the sigmoid colon. Recommendation:           - Discharge patient to home.                           - Resume previous diet.                           - FU with Dr. Maria Shiner.                           - The findings and recommendations were discussed                            with the patient's family. Unfortunately, no                            endoscopic intervention will be helpful for                            malignant bleeding over longer term. Lajuan Pila, MD 01/03/2024 10:17:11 AM This report has been signed electronically.

## 2024-01-03 NOTE — Progress Notes (Signed)
 Called to room to assist during endoscopic procedure.  Patient ID and intended procedure confirmed with present staff. Received instructions for my participation in the procedure from the performing physician.

## 2024-01-03 NOTE — Progress Notes (Signed)
 Pt A/O x 3, gd SR's, pleased with anesthesia, report to RN

## 2024-01-04 ENCOUNTER — Telehealth: Payer: Self-pay

## 2024-01-04 NOTE — Telephone Encounter (Signed)
  Follow up Call-     01/03/2024    9:02 AM  Call back number  Post procedure Call Back phone  # [phone] (son)  Permission to leave phone message Yes     Patient questions:  Do you have a fever, pain , or abdominal swelling? No. Pain Score  0 *  Have you tolerated food without any problems? Yes.    Have you been able to return to your normal activities? Yes.    Do you have any questions about your discharge instructions: Diet   No. Medications  No. Follow up visit  No.  Do you have questions or concerns about your Care? No.  Actions: * If pain score is 4 or above: No action needed, pain <4.   Talked to patients son, pt is still sleeping this morning. Son states that she was sore yesterday, but got a lot of rest and seemed to be doing well before going to bed last night. Advised to call if she had any questions or concerns when she woke up today.

## 2024-01-07 LAB — SURGICAL PATHOLOGY

## 2024-01-09 ENCOUNTER — Encounter: Payer: Self-pay | Admitting: Hematology & Oncology

## 2024-01-09 ENCOUNTER — Inpatient Hospital Stay: Attending: Hematology & Oncology

## 2024-01-09 ENCOUNTER — Telehealth: Payer: Self-pay | Admitting: *Deleted

## 2024-01-09 ENCOUNTER — Encounter: Payer: Self-pay | Admitting: *Deleted

## 2024-01-09 ENCOUNTER — Inpatient Hospital Stay (HOSPITAL_BASED_OUTPATIENT_CLINIC_OR_DEPARTMENT_OTHER): Admitting: Hematology & Oncology

## 2024-01-09 ENCOUNTER — Inpatient Hospital Stay

## 2024-01-09 VITALS — BP 173/65 | HR 95 | Resp 18 | Ht 60.0 in | Wt 170.0 lb

## 2024-01-09 DIAGNOSIS — Z79890 Hormone replacement therapy (postmenopausal): Secondary | ICD-10-CM | POA: Insufficient documentation

## 2024-01-09 DIAGNOSIS — C20 Malignant neoplasm of rectum: Secondary | ICD-10-CM

## 2024-01-09 DIAGNOSIS — Z1509 Genetic susceptibility to other malignant neoplasm: Secondary | ICD-10-CM

## 2024-01-09 DIAGNOSIS — Z79899 Other long term (current) drug therapy: Secondary | ICD-10-CM | POA: Diagnosis not present

## 2024-01-09 DIAGNOSIS — D500 Iron deficiency anemia secondary to blood loss (chronic): Secondary | ICD-10-CM

## 2024-01-09 LAB — CBC WITH DIFFERENTIAL (CANCER CENTER ONLY)
Abs Immature Granulocytes: 0.03 10*3/uL (ref 0.00–0.07)
Basophils Absolute: 0.1 10*3/uL (ref 0.0–0.1)
Basophils Relative: 1 %
Eosinophils Absolute: 0.3 10*3/uL (ref 0.0–0.5)
Eosinophils Relative: 4 %
HCT: 41.3 % (ref 36.0–46.0)
Hemoglobin: 13.4 g/dL (ref 12.0–15.0)
Immature Granulocytes: 0 %
Lymphocytes Relative: 45 %
Lymphs Abs: 3.6 10*3/uL (ref 0.7–4.0)
MCH: 29.3 pg (ref 26.0–34.0)
MCHC: 32.4 g/dL (ref 30.0–36.0)
MCV: 90.2 fL (ref 80.0–100.0)
Monocytes Absolute: 0.7 10*3/uL (ref 0.1–1.0)
Monocytes Relative: 9 %
Neutro Abs: 3.2 10*3/uL (ref 1.7–7.7)
Neutrophils Relative %: 41 %
Platelet Count: 250 10*3/uL (ref 150–400)
RBC: 4.58 MIL/uL (ref 3.87–5.11)
RDW: 14.5 % (ref 11.5–15.5)
WBC Count: 7.9 10*3/uL (ref 4.0–10.5)
nRBC: 0 % (ref 0.0–0.2)

## 2024-01-09 LAB — CMP (CANCER CENTER ONLY)
ALT: 19 U/L (ref 0–44)
AST: 15 U/L (ref 15–41)
Albumin: 4.6 g/dL (ref 3.5–5.0)
Alkaline Phosphatase: 109 U/L (ref 38–126)
Anion gap: 10 (ref 5–15)
BUN: 16 mg/dL (ref 8–23)
CO2: 29 mmol/L (ref 22–32)
Calcium: 9.4 mg/dL (ref 8.9–10.3)
Chloride: 95 mmol/L — ABNORMAL LOW (ref 98–111)
Creatinine: 0.84 mg/dL (ref 0.44–1.00)
GFR, Estimated: >60 mL/min (ref >60)
Glucose, Bld: 224 mg/dL — ABNORMAL HIGH (ref 70–99)
Potassium: 3.4 mmol/L — ABNORMAL LOW (ref 3.5–5.1)
Sodium: 134 mmol/L — ABNORMAL LOW (ref 135–145)
Total Bilirubin: 0.4 mg/dL (ref 0.0–1.2)
Total Protein: 7 g/dL (ref 6.5–8.1)

## 2024-01-09 LAB — CEA (ACCESS): CEA (CHCC): 1.18 ng/mL (ref 0.00–5.00)

## 2024-01-09 LAB — FERRITIN: Ferritin: 214 ng/mL (ref 11–307)

## 2024-01-09 LAB — IRON AND IRON BINDING CAPACITY (CC-WL,HP ONLY)
Iron: 85 ug/dL (ref 28–170)
Saturation Ratios: 24 % (ref 10.4–31.8)
TIBC: 351 ug/dL (ref 250–450)
UIBC: 266 ug/dL (ref 148–442)

## 2024-01-09 MED ORDER — HEPARIN SOD (PORK) LOCK FLUSH 100 UNIT/ML IV SOLN
500.0000 [IU] | Freq: Once | INTRAVENOUS | Status: AC
  Administered 2024-01-09: 500 [IU] via INTRAVENOUS

## 2024-01-09 MED ORDER — SODIUM CHLORIDE 0.9% FLUSH
10.0000 mL | INTRAVENOUS | Status: DC | PRN
  Administered 2024-01-09: 10 mL via INTRAVENOUS

## 2024-01-09 NOTE — Progress Notes (Signed)
 Hematology and Oncology Follow Up Visit  [name] [id number] 1929/01/20 89 y.o. 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 3 weeks-s/p cycle #1  -- start on 02/09/2023 -- d/c on 04/12/2023 Xeloda  1500 mg po BID (14/7) -- s/p cycle #6 on 02/13/2023 -- changed to 1000mg  po BID on 08/31/2023 --on hold since 08/2023      Interim History:  [name] is back for follow-up.  Unfortunately, it looks like we have recurrent disease.  She was having some intermittent rectal bleeding.  She did have a flexible sigmoidoscopy.  This did show a tumor in the rectum.  There was a 5x4 cm fungating nonobstructing mass in the distal rectum.  This was biopsied.  The pathology report ([id number] ) showed an invasive moderately differentiated adenocarcinoma.  There was mismatch repair with this.  I am sending off for [facility] analysis.  I did speak with Dr. Eloise Hake of Radiation Oncology.  I think that she would benefit from radiotherapy along with low-dose Xeloda .  She is going to have a PET scan tomorrow.  Her last CEA level was 1.04.  She has had no pain in the rectum.  She says that when she sits for a while, there is a little bit of discomfort.  She has had no cough or shortness of breath.  She has had no nausea or vomiting.  She has gained a little bit of weight.  Overall, I would say performance status is probably ECOG 1.    Medications:  Current Outpatient Medications:    amiodarone  (PACERONE ) 200 MG tablet, Take 0.5 tablets (100 mg total) by mouth daily., Disp: 45 tablet, Rfl: 2   amLODipine  (NORVASC ) 5 MG tablet, Take 5 mg by mouth in the morning and at bedtime., Disp: , Rfl:    cholecalciferol (VITAMIN D3) 25 MCG (1000 UNIT) tablet, Take 1,000 Units by mouth daily., Disp: , Rfl:    cyanocobalamin  1000 MCG tablet, Take 1 tablet (1,000 mcg total) by mouth  daily., Disp: 30 tablet, Rfl: 0   escitalopram  (LEXAPRO ) 5 MG tablet, Take 5 mg by mouth at bedtime., Disp: , Rfl:    famotidine (PEPCID) 20 MG tablet, Take 20 mg by mouth daily., Disp: , Rfl:    feeding supplement (ENSURE ENLIVE / ENSURE PLUS) LIQD, Take 237 mLs by mouth 3 (three) times daily with meals. (Patient taking differently: Take 237 mLs by mouth 3 (three) times daily as needed (nutrition).), Disp: 237 mL, Rfl: 12   fluticasone  (FLONASE ) 50 MCG/ACT nasal spray, Place 2 sprays into both nostrils daily., Disp: 11.1 mL, Rfl: 0   hydrALAZINE  (APRESOLINE ) 25 MG tablet, Take 1 tablet (25 mg total) by mouth 2 (two) times daily., Disp: 180 tablet, Rfl: 3   levothyroxine  (SYNTHROID ) 175 MCG tablet, Take 175 mcg by mouth every morning., Disp: , Rfl:    lidocaine -prilocaine  (EMLA ) cream, Apply 1 Application topically as needed., Disp: 30 g, Rfl: 4   loratadine  (CLARITIN ) 10 MG tablet, Take 1 tablet (10 mg total) by mouth daily., Disp: 30 tablet, Rfl: 0   potassium chloride  SA (K-DUR,KLOR-CON ) 20 MEQ tablet, Take 20 mEq by mouth daily., Disp: , Rfl:    triamterene -hydrochlorothiazide  (MAXZIDE-25) 37.5-25 MG tablet, Take 1 tablet by mouth daily., Disp: 90 tablet, Rfl: 1   TRUE METRIX BLOOD GLUCOSE TEST test strip, , Disp: , Rfl:    acetaminophen  (TYLENOL ) 325 MG tablet, Take 2  tablets (650 mg total) by mouth every 6 (six) hours as needed for mild pain (or Fever >/= 101). (Patient not taking: Reported on 01/09/2024), Disp: 30 tablet, Rfl: 0   ALPRAZolam  (XANAX ) 0.5 MG tablet, Take 0.5 mg by mouth 3 (three) times daily as needed for anxiety. For anxiety (Patient not taking: Reported on 01/09/2024), Disp: , Rfl:    clotrimazole  (LOTRIMIN ) 1 % cream, Apply 1 Application topically 2 (two) times daily. (Patient not taking: Reported on 12/17/2023), Disp: 30 g, Rfl: 0   polyethylene glycol (MIRALAX  / GLYCOLAX ) 17 g packet, Take 17 g by mouth daily as needed for mild constipation. (Patient not taking: Reported on  01/09/2024), Disp: 14 each, Rfl: 0   prochlorperazine  (COMPAZINE ) 10 MG tablet, Take 1 tablet (10 mg total) by mouth every 6 (six) hours as needed. (Patient not taking: Reported on 01/09/2024), Disp: 30 tablet, Rfl: 1 No current facility-administered medications for this visit.  Facility-Administered Medications Ordered in Other Visits:    sodium chloride  flush (NS) 0.9 % injection 10 mL, 10 mL, Intravenous, PRN, Jinelle Butchko R, MD, 10 mL at 01/09/24 0749  Allergies:  Allergies  Allergen Reactions   Aggrenox  [Aspirin -Dipyridamole  Er] Other (See Comments)    Severe Bleeding and Stomach Pain   Prednisone  Other (See Comments)    Climbs the walls    Past Medical History, Surgical history, Social history, and Family History were reviewed and updated.  Review of Systems: Review of Systems  Constitutional:  Positive for fatigue.  HENT:  Negative.    Eyes: Negative.   Respiratory: Negative.    Cardiovascular:  Positive for palpitations.  Gastrointestinal:  Positive for blood in stool and rectal pain.  Endocrine: Negative.   Genitourinary: Negative.    Musculoskeletal: Negative.   Skin: Negative.   Neurological:  Positive for light-headedness.  Hematological: Negative.   Psychiatric/Behavioral: Negative.      Physical Exam: Her vital signs show temperature of 98.  Pulse 95.  Blood pressure 173/65.  Weight is 170 pounds.     Wt Readings from Last 3 Encounters:  01/09/24 170 lb (77.1 kg)  01/03/24 166 lb (75.3 kg)  12/17/23 166 lb (75.3 kg)    Physical Exam Vitals reviewed.  HENT:     Head: Normocephalic and atraumatic.  Eyes:     Pupils: Pupils are equal, round, and reactive to light.  Cardiovascular:     Rate and Rhythm: Normal rate and regular rhythm.     Heart sounds: Normal heart sounds.     Comments: Cardiac exam is regular rate and rhythm.  She has no murmurs, rubs or bruits. Pulmonary:     Effort: Pulmonary effort is normal.     Breath sounds: Normal breath  sounds.     Comments: She has good air movement bilaterally.  She has some crackles bilaterally.  I hear no wheezes. Abdominal:     General: Bowel sounds are normal.     Palpations: Abdomen is soft.  Musculoskeletal:        General: No tenderness or deformity. Normal range of motion.     Cervical back: Normal range of motion.  Lymphadenopathy:     Cervical: No cervical adenopathy.  Skin:    General: Skin is warm and dry.     Findings: No erythema or rash.  Neurological:     Mental Status: She is alert and oriented to person, place, and time.  Psychiatric:        Behavior: Behavior normal.  Thought Content: Thought content normal.        Judgment: Judgment normal.    Lab Results  Component Value Date   WBC 9.2 11/29/2023   HGB 14.1 11/29/2023   HCT 41.1 11/29/2023   MCV 89.3 11/29/2023   PLT 244 11/29/2023     Chemistry      Component Value Date/Time   NA 134 (L) 11/29/2023 1521   K 3.4 (L) 11/29/2023 1521   CL 94 (L) 11/29/2023 1521   CO2 29 11/29/2023 1521   BUN 14 11/29/2023 1521   CREATININE 0.85 11/29/2023 1521      Component Value Date/Time   CALCIUM  9.4 11/29/2023 1521   ALKPHOS 68 11/29/2023 1521   AST 18 11/29/2023 1521   ALT 22 11/29/2023 1521   BILITOT 0.7 11/29/2023 1521      Impression and Plan: Ms. Chambliss is a very charming 89 year old white female.  She has at least a locally advanced adenocarcinoma of the rectum.-I would have to say that she has had metastatic disease since the recent CT scan that she had done did not show any evidence of the pulmonary nodule.  She now has recurrence.  I suspect that his recurrence is local..  I do think that radiotherapy with Xeloda  would be a good way to treat her.  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  1 possibility is immunotherapy.  I know we tried her on immunotherapy in the past.  Again I would like to try local therapy with  radiotherapy.  Will be interesting to see what the PET scan shows.  We will plan to probably get her back and get her started on treatment in about a week or so.  Again, Dr. Eloise Hake  we will get her in.   Ivor Mars, MD 4/30/20258:06 AM

## 2024-01-09 NOTE — Telephone Encounter (Signed)
 [facility] sent on pathology from colorectal surgery on 4.24.2025 ORD [phone]

## 2024-01-09 NOTE — Progress Notes (Signed)
 Per Dr Maria Shiner, request for [facility] sent on specimen [id number] DOS 01/03/2024.  Oncology Nurse Navigator Documentation     01/09/2024    8:45 AM  Oncology Nurse Navigator Flowsheets  Navigator Location [facility]  Navigator Encounter Type Molecular Studies  Patient Visit Type MedOnc  Barriers/Navigation Needs No Barriers At This Time  Interventions Coordination of Care  Acuity Level 1-No Barriers  Coordination of Care Pathology  Support Groups/Services Friends and Family  Time Spent with Patient 30

## 2024-01-09 NOTE — Patient Instructions (Signed)

## 2024-01-10 ENCOUNTER — Encounter (HOSPITAL_COMMUNITY)
Admission: RE | Admit: 2024-01-10 | Discharge: 2024-01-10 | Disposition: A | Discharge status: Home / Self Care | Source: Ambulatory Visit | Attending: Hematology & Oncology | Admitting: Hematology & Oncology

## 2024-01-10 DIAGNOSIS — C20 Malignant neoplasm of rectum: Secondary | ICD-10-CM | POA: Diagnosis present

## 2024-01-10 LAB — GLUCOSE, CAPILLARY: Glucose-Capillary: 145 mg/dL — ABNORMAL HIGH (ref 70–99)

## 2024-01-10 MED ORDER — FLUDEOXYGLUCOSE F - 18 (FDG) INJECTION
8.5000 | Freq: Once | INTRAVENOUS | Status: AC
  Administered 2024-01-10: 8.5 via INTRAVENOUS

## 2024-01-11 ENCOUNTER — Encounter: Payer: Self-pay | Admitting: *Deleted

## 2024-01-11 ENCOUNTER — Telehealth: Payer: Self-pay | Admitting: Radiation Oncology

## 2024-01-11 NOTE — Telephone Encounter (Signed)
LVM to schedule CON with Dr. Kinard 

## 2024-01-14 ENCOUNTER — Telehealth: Payer: Self-pay | Admitting: Radiation Oncology

## 2024-01-14 NOTE — Telephone Encounter (Signed)
 Called pt's son to schedule appt with Dr. Eloise Hake. Due to pt and son's limited availability, we were able to schedule CON for 5/15@2 :30pm. Son is concerned that f/u with Ennever was supposed to happen after consult, I advised them to call his office and check.

## 2024-01-15 ENCOUNTER — Encounter: Payer: Self-pay | Admitting: Hematology & Oncology

## 2024-01-23 ENCOUNTER — Inpatient Hospital Stay

## 2024-01-23 ENCOUNTER — Inpatient Hospital Stay: Attending: Hematology & Oncology

## 2024-01-23 ENCOUNTER — Inpatient Hospital Stay (HOSPITAL_BASED_OUTPATIENT_CLINIC_OR_DEPARTMENT_OTHER): Admitting: Hematology & Oncology

## 2024-01-23 ENCOUNTER — Encounter: Payer: Self-pay | Admitting: Hematology & Oncology

## 2024-01-23 VITALS — BP 167/56 | HR 95 | Temp 97.0°F | Resp 19 | Ht 60.0 in | Wt 168.2 lb

## 2024-01-23 DIAGNOSIS — Z79631 Long term (current) use of antimetabolite agent: Secondary | ICD-10-CM | POA: Diagnosis not present

## 2024-01-23 DIAGNOSIS — Z79630 Long term (current) use of alkylating agent: Secondary | ICD-10-CM | POA: Diagnosis not present

## 2024-01-23 DIAGNOSIS — Z1509 Genetic susceptibility to other malignant neoplasm: Secondary | ICD-10-CM

## 2024-01-23 DIAGNOSIS — Z5111 Encounter for antineoplastic chemotherapy: Secondary | ICD-10-CM | POA: Diagnosis present

## 2024-01-23 DIAGNOSIS — C20 Malignant neoplasm of rectum: Secondary | ICD-10-CM

## 2024-01-23 DIAGNOSIS — Z510 Encounter for antineoplastic radiation therapy: Secondary | ICD-10-CM | POA: Diagnosis present

## 2024-01-23 DIAGNOSIS — Z79633 Long term (current) use of mitotic inhibitor: Secondary | ICD-10-CM | POA: Diagnosis not present

## 2024-01-23 LAB — CBC WITH DIFFERENTIAL (CANCER CENTER ONLY)
Abs Immature Granulocytes: 0.08 10*3/uL — ABNORMAL HIGH (ref 0.00–0.07)
Basophils Absolute: 0.1 10*3/uL (ref 0.0–0.1)
Basophils Relative: 1 %
Eosinophils Absolute: 0.2 10*3/uL (ref 0.0–0.5)
Eosinophils Relative: 2 %
HCT: 39.1 % (ref 36.0–46.0)
Hemoglobin: 13.2 g/dL (ref 12.0–15.0)
Immature Granulocytes: 1 %
Lymphocytes Relative: 43 %
Lymphs Abs: 3.4 10*3/uL (ref 0.7–4.0)
MCH: 30.2 pg (ref 26.0–34.0)
MCHC: 33.8 g/dL (ref 30.0–36.0)
MCV: 89.5 fL (ref 80.0–100.0)
Monocytes Absolute: 0.6 10*3/uL (ref 0.1–1.0)
Monocytes Relative: 8 %
Neutro Abs: 3.7 10*3/uL (ref 1.7–7.7)
Neutrophils Relative %: 45 %
Platelet Count: 241 10*3/uL (ref 150–400)
RBC: 4.37 MIL/uL (ref 3.87–5.11)
RDW: 14.2 % (ref 11.5–15.5)
WBC Count: 8 10*3/uL (ref 4.0–10.5)
nRBC: 0 % (ref 0.0–0.2)

## 2024-01-23 LAB — CMP (CANCER CENTER ONLY)
ALT: 18 U/L (ref 0–44)
AST: 14 U/L — ABNORMAL LOW (ref 15–41)
Albumin: 4.6 g/dL (ref 3.5–5.0)
Alkaline Phosphatase: 91 U/L (ref 38–126)
Anion gap: 11 (ref 5–15)
BUN: 14 mg/dL (ref 8–23)
CO2: 29 mmol/L (ref 22–32)
Calcium: 9.4 mg/dL (ref 8.9–10.3)
Chloride: 94 mmol/L — ABNORMAL LOW (ref 98–111)
Creatinine: 0.86 mg/dL (ref 0.44–1.00)
GFR, Estimated: >60 mL/min (ref >60)
Glucose, Bld: 234 mg/dL — ABNORMAL HIGH (ref 70–99)
Potassium: 3.1 mmol/L — ABNORMAL LOW (ref 3.5–5.1)
Sodium: 134 mmol/L — ABNORMAL LOW (ref 135–145)
Total Bilirubin: 0.5 mg/dL (ref 0.0–1.2)
Total Protein: 7 g/dL (ref 6.5–8.1)

## 2024-01-23 LAB — RETICULOCYTES
Immature Retic Fract: 13.7 % (ref 2.3–15.9)
RBC.: 4.44 MIL/uL (ref 3.87–5.11)
Retic Count, Absolute: 122.5 10*3/uL (ref 19.0–186.0)
Retic Ct Pct: 2.8 % (ref 0.4–3.1)

## 2024-01-23 LAB — PREALBUMIN: Prealbumin: 24 mg/dL (ref 18–38)

## 2024-01-23 LAB — FERRITIN: Ferritin: 180 ng/mL (ref 11–307)

## 2024-01-23 MED ORDER — HEPARIN SOD (PORK) LOCK FLUSH 100 UNIT/ML IV SOLN
500.0000 [IU] | Freq: Once | INTRAVENOUS | Status: AC
Start: 2024-01-23 — End: 2024-01-23
  Administered 2024-01-23: 500 [IU] via INTRAVENOUS

## 2024-01-23 MED ORDER — SODIUM CHLORIDE 0.9% FLUSH
10.0000 mL | INTRAVENOUS | Status: DC | PRN
Start: 2024-01-23 — End: 2024-01-23
  Administered 2024-01-23: 10 mL via INTRAVENOUS

## 2024-01-23 NOTE — Progress Notes (Signed)
 Hematology and Oncology Follow Up Visit  [name] [id number] [birth year] 89 y.o. 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 3 weeks-s/p cycle #1  -- start on 02/09/2023 -- d/c on 04/12/2023 Xeloda  1500 mg po BID (14/7) -- s/p cycle #6 on 02/13/2023 -- changed to 1000mg  po BID on 08/31/2023 --on hold since 08/2023  5-FU/Oxaliplatin +XRT -- start on 01/28/2024     Interim History:  [name] is back for follow-up.  Unfortunately, it looks like we have recurrent disease.  She had a flexible sigmoidoscopy.  This was done on 01/04/2024.  There was noted to be a tumor in the rectum.  This was biopsied.  The pathology report ([id number]) showed an invasive adenocarcinoma.  She had a PET scan that was done.  This was done on May 1.  Therapy, the PET scan did not show any evidence of disease outside of the rectum.  She had no obvious adenopathy.  She had no disease in her lungs.  Her last CEA was still normal at 1.18.  We are awaiting the molecular analysis.  We are seeing about the possibility of immunotherapy.  For right now, I really think that we are going to have to treat with chemoradiation therapy.  She still having some rectal bleeding.  Her hemoglobin is pretty good right now so what ever she is bleeding is not all that much.  I think that she would be a good candidate for infusional 5-FU with low-dose oxaliplatin.  I think we can get some good radio sensitization out of this protocol.  I think she would be able to tolerate the protocol well.  She has had no cough.  There is no nausea or vomiting.  She has had no leg swelling.  There has been no rashes.  Overall, I would have to say that her performance status is probably ECOG 2.    Medications:  Current Outpatient Medications:    ALPRAZolam  (XANAX ) 0.5 MG tablet, Take 0.5 mg by mouth 3 (three) times daily  as needed for anxiety. For anxiety, Disp: , Rfl:    amiodarone  (PACERONE ) 200 MG tablet, Take 0.5 tablets (100 mg total) by mouth daily., Disp: 45 tablet, Rfl: 2   amLODipine  (NORVASC ) 10 MG tablet, Take 10 mg by mouth daily., Disp: , Rfl:    cholecalciferol (VITAMIN D3) 25 MCG (1000 UNIT) tablet, Take 1,000 Units by mouth daily., Disp: , Rfl:    clotrimazole  (LOTRIMIN ) 1 % cream, Apply 1 Application topically 2 (two) times daily., Disp: 30 g, Rfl: 0   cyanocobalamin  1000 MCG tablet, Take 1 tablet (1,000 mcg total) by mouth daily., Disp: 30 tablet, Rfl: 0   escitalopram  (LEXAPRO ) 5 MG tablet, Take 5 mg by mouth at bedtime., Disp: , Rfl:    famotidine (PEPCID) 20 MG tablet, Take 20 mg by mouth daily., Disp: , Rfl:    feeding supplement (ENSURE ENLIVE / ENSURE PLUS) LIQD, Take 237 mLs by mouth 3 (three) times daily with meals. (Patient taking differently: Take 237 mLs by mouth 3 (three) times daily as needed (nutrition).), Disp: 237 mL, Rfl: 12   fluticasone  (FLONASE ) 50 MCG/ACT nasal spray, Place 2 sprays into both nostrils daily., Disp: 11.1 mL, Rfl: 0   hydrALAZINE  (APRESOLINE ) 25 MG tablet, Take 1 tablet (25 mg total) by mouth 2 (two) times daily., Disp: 180 tablet, Rfl: 3   levothyroxine  (SYNTHROID ) 175 MCG tablet, Take  175 mcg by mouth every morning., Disp: , Rfl:    lidocaine -prilocaine  (EMLA ) cream, Apply 1 Application topically as needed., Disp: 30 g, Rfl: 4   loratadine  (CLARITIN ) 10 MG tablet, Take 1 tablet (10 mg total) by mouth daily., Disp: 30 tablet, Rfl: 0   potassium chloride  SA (K-DUR,KLOR-CON ) 20 MEQ tablet, Take 20 mEq by mouth daily., Disp: , Rfl:    triamterene -hydrochlorothiazide  (MAXZIDE-25) 37.5-25 MG tablet, Take 1 tablet by mouth daily., Disp: 90 tablet, Rfl: 1   TRUE METRIX BLOOD GLUCOSE TEST test strip, , Disp: , Rfl:    acetaminophen  (TYLENOL ) 325 MG tablet, Take 2 tablets (650 mg total) by mouth every 6 (six) hours as needed for mild pain (or Fever >/= 101). (Patient not  taking: Reported on 01/09/2024), Disp: 30 tablet, Rfl: 0   polyethylene glycol (MIRALAX  / GLYCOLAX ) 17 g packet, Take 17 g by mouth daily as needed for mild constipation. (Patient not taking: Reported on 01/23/2024), Disp: 14 each, Rfl: 0   prochlorperazine  (COMPAZINE ) 10 MG tablet, Take 1 tablet (10 mg total) by mouth every 6 (six) hours as needed. (Patient not taking: Reported on 01/23/2024), Disp: 30 tablet, Rfl: 1 No current facility-administered medications for this visit.  Facility-Administered Medications Ordered in Other Visits:    sodium chloride  flush (NS) 0.9 % injection 10 mL, 10 mL, Intravenous, PRN, Kadie Balestrieri R, MD, 10 mL at 01/23/24 1528  Allergies:  Allergies  Allergen Reactions   Aggrenox  [Aspirin -Dipyridamole  Er] Other (See Comments)    Severe Bleeding and Stomach Pain   Prednisone  Other (See Comments)    Climbs the walls    Past Medical History, Surgical history, Social history, and Family History were reviewed and updated.  Review of Systems: Review of Systems  Constitutional:  Positive for fatigue.  HENT:  Negative.    Eyes: Negative.   Respiratory: Negative.    Cardiovascular:  Positive for palpitations.  Gastrointestinal:  Positive for blood in stool and rectal pain.  Endocrine: Negative.   Genitourinary: Negative.    Musculoskeletal: Negative.   Skin: Negative.   Neurological:  Positive for light-headedness.  Hematological: Negative.   Psychiatric/Behavioral: Negative.      Physical Exam: Her vital signs show temperature of 97.  Pulse 95.  Blood pressure 167/56.  Weight is 168 pounds.     Wt Readings from Last 3 Encounters:  01/23/24 168 lb 3.2 oz (76.3 kg)  01/09/24 170 lb (77.1 kg)  01/03/24 166 lb (75.3 kg)    Physical Exam Vitals reviewed.  HENT:     Head: Normocephalic and atraumatic.  Eyes:     Pupils: Pupils are equal, round, and reactive to light.  Cardiovascular:     Rate and Rhythm: Normal rate and regular rhythm.     Heart  sounds: Normal heart sounds.     Comments: Cardiac exam is regular rate and rhythm.  She has no murmurs, rubs or bruits. Pulmonary:     Effort: Pulmonary effort is normal.     Breath sounds: Normal breath sounds.     Comments: She has good air movement bilaterally.  She has some crackles bilaterally.  I hear no wheezes. Abdominal:     General: Bowel sounds are normal.     Palpations: Abdomen is soft.  Musculoskeletal:        General: No tenderness or deformity. Normal range of motion.     Cervical back: Normal range of motion.  Lymphadenopathy:     Cervical: No cervical adenopathy.  Skin:  General: Skin is warm and dry.     Findings: No erythema or rash.  Neurological:     Mental Status: She is alert and oriented to person, place, and time.  Psychiatric:        Behavior: Behavior normal.        Thought Content: Thought content normal.        Judgment: Judgment normal.    Lab Results  Component Value Date   WBC 8.0 01/23/2024   HGB 13.2 01/23/2024   HCT 39.1 01/23/2024   MCV 89.5 01/23/2024   PLT 241 01/23/2024     Chemistry      Component Value Date/Time   NA 134 (L) 01/09/2024 0744   K 3.4 (L) 01/09/2024 0744   CL 95 (L) 01/09/2024 0744   CO2 29 01/09/2024 0744   BUN 16 01/09/2024 0744   CREATININE 0.84 01/09/2024 0744      Component Value Date/Time   CALCIUM  9.4 01/09/2024 0744   ALKPHOS 109 01/09/2024 0744   AST 15 01/09/2024 0744   ALT 19 01/09/2024 0744   BILITOT 0.4 01/09/2024 0744      Impression and Plan: [name] is a very charming 89 year old white female.  She has at least a locally advanced adenocarcinoma of the rectum.-I would have to say that she has had metastatic disease since the recent CT scan that she had done did not show any evidence of the pulmonary nodule.  She now has recurrence.  I suspect that his recurrence is local..  I feel that the PET scan helps confirm that this is a local recurrence.  I know that she really had a hard time  with Xeloda .  As such, we may have to consider 5-FU pump.  I realize this is more of a aggravation but I think would be safer for her and will hopefully avoid some of the toxicity of Xeloda .  I also think that low-dose oxaliplatin given weekly with the 5-FU infusion would also be a good idea.  I would like to try to get as much chemotherapy as I can into the body that can be used by radiotherapy.  I do think she should have a very good response.  Again, we will have to watch out for toxicity.  I realize that she is 89 years old.  We have to be careful with side effects.  I had a long talk with she and her daughter.  We will try to get treatment started on Monday.  I suspect that she probably will not have radiotherapy until after [facility].  We will probably have to see her back weekly while she is getting treatment.  Again I want to try to be aggressive since she has done so well.  Will be interesting to see what the molecular studies show us .    Ivor Mars, MD 5/14/20254:01 PM

## 2024-01-23 NOTE — Progress Notes (Signed)
 BP 167/56, patient states her PCP is ok with systolic in the 160s. Instructed to continue to monitor at home and notify PCP if it becomes higher than 169. Verbalized understanding.

## 2024-01-23 NOTE — Progress Notes (Signed)
 GI Location of Tumor / Histology: Rectum   [name] presented months ago with symptoms of: intermittent rectal bleeding  Biopsies of rectum (if applicable) revealed:    Past/Anticipated interventions by surgeon, if any: no  Past/Anticipated interventions by medical oncology, if any: yes   Weight changes, if any: No  Bowel/Bladder complaints, if any: No  Nausea / Vomiting, if any: Yes  Pain issues, if any:  No  Any blood per rectum:   yes  SAFETY ISSUES: Prior radiation? no Pacemaker/ICD? no Possible current pregnancy? no Is the patient on methotrexate? no  Current Complaints/Details:

## 2024-01-23 NOTE — Patient Instructions (Signed)

## 2024-01-23 NOTE — Progress Notes (Signed)
 DISCONTINUE ON PATHWAY REGIMEN - Colorectal     A cycle is every 21 days:     Pembrolizumab    **Always confirm dose/schedule in your pharmacy ordering system**  PRIOR TREATMENT: [id number]: Pembrolizumab  200 mg q21 Days Until Progression or Unacceptable Toxicity or up to 24 Months  START OFF PATHWAY REGIMEN - Colorectal   [id number]:Fluorouracil 225 mg/m2/day CIV D1-5 q7 Days + RT:   A cycle is every 7 days, concurrent with RT:     Fluorouracil   **Always confirm dose/schedule in your pharmacy ordering system**  Patient Characteristics: Preoperative or Nonsurgical Candidate, M0 (Clinical Staging), Rectal, cT2, cN1 or cT3, cN0-1, and Not a Candidate for Sphincter-sparing Surgery or Neoadjuvant ChemoRT Preferred Tumor Location: Rectal Therapeutic Status: Preoperative or Nonsurgical Candidate, M0 (Clinical Staging) AJCC T Category: cT3 AJCC N Category: cN0 AJCC M Category: cM0 AJCC 8 Stage Grouping: IIA Intent of Therapy: Curative Intent, Discussed with Patient

## 2024-01-24 ENCOUNTER — Encounter: Payer: Self-pay | Admitting: Radiation Oncology

## 2024-01-24 ENCOUNTER — Encounter: Payer: Self-pay | Admitting: Hematology & Oncology

## 2024-01-24 ENCOUNTER — Ambulatory Visit
Admission: RE | Admit: 2024-01-24 | Discharge: 2024-01-24 | Disposition: A | Discharge status: Home / Self Care | Source: Ambulatory Visit | Attending: Radiation Oncology | Admitting: Radiation Oncology

## 2024-01-24 VITALS — BP 159/64 | HR 91 | Temp 97.1°F | Resp 18 | Ht 60.0 in | Wt 169.2 lb

## 2024-01-24 DIAGNOSIS — C20 Malignant neoplasm of rectum: Secondary | ICD-10-CM

## 2024-01-24 LAB — IRON AND IRON BINDING CAPACITY (CC-WL,HP ONLY)
Iron: 66 ug/dL (ref 28–170)
Saturation Ratios: 18 % (ref 10.4–31.8)
TIBC: 365 ug/dL (ref 250–450)
UIBC: 299 ug/dL (ref 148–442)

## 2024-01-24 NOTE — Progress Notes (Signed)
 Radiation Oncology         [phone]) [phone] ________________________________  Initial Outpatient Consultation Note  Name: [name] MRN: [id number]  Date: 01/24/2024  DOB: Sep 04, 1929  FA:[id number], Autry Legions, MD  Ivor Mars, MD   REFERRING PHYSICIAN: Ivor Mars, MD  DIAGNOSIS: The encounter diagnosis was Rectal cancer [facility]).  Recurrent stage IIA (cT3, N0, M0) rectal adenocarcinoma  HISTORY OF PRESENT ILLNESS::[name] is a 89 y.o. female who is accompanied by her supportive future daughter-in-law. she is seen as a courtesy of Dr. Maria Shiner for an opinion concerning radiation therapy as part of management for her diagnosed recurrent rectal cancer. Patient was first diagnosed with rectal cancer following an episode of rectal bleeding that occurred back in May of 2024. Biopsy of rectal mass showed moderately differentiated adenocarcinoma. She underwent pembrolizumab  200mg  IV q3 weeks from 02/09/2023 and discontinued this on 04/12/2023 due to complications with her breathing. She was started on Xeloda  1500 mg BID on 02/13/2023 which has been on hold since December of 2024 due to poor tolerance.    Most recently, patient recently underwent a flexible sigmoidoscopy on 01/03/24 under the care of Dr. Venice Gillis due to new rectal bleeding. Procedure indicated a 5x4 cm fungating nonobstructing mass in the distal rectum, mass has increased from previous exam in July of 2024. Surgical pathology indicated invasive moderately differentiated adenocarcinoma.   Most recent restaging PET scan preformed on 01/10/24 demonstrates Significant abnormal uptake along the left side of the low rectum corresponding to the mass lesion by prior MRI (Irregular nodular tissue extends along left side of the low rectum from proximally the 1 o'clock position to 6 O'clock). No additional areas of abnormal uptake at this time to suggest metastatic disease. Scan also noted small lung nodules are again seen and are not hypermetabolic  but are quite small.    During her most recent follow up with Dr. Maria Shiner on 01/23/24, they discussed proceeding with chemoradiation therapy. She is scheduled to start her chemotherapy on 01/28/2024.   Patient states she is still experiencing intermittent bright red rectal bleeding. She experiences occasional nausea and constipation which she takes Miralax  for. She denies any abdominal pain or diarrhea.   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   Mucous retention cyst of maxillary sinus    Osteoporosis    Pinched nerve In neck   Plantar fasciitis    Stroke (HCC)    Vitamin D deficiency     PAST SURGICAL HISTORY: Past Surgical History:  Procedure Laterality Date   APPENDECTOMY     BIOPSY  01/19/2023   Procedure: BIOPSY;  Surgeon: Nannette Babe, MD;  Location: [facility];  Service: Gastroenterology;;   BIOPSY  03/21/2023   Procedure: BIOPSY;  Surgeon: Lajuan Pila, MD;  Location: [facility];  Service: Gastroenterology;;   BIOPSY  03/22/2023   Procedure: BIOPSY;  Surgeon: Lajuan Pila, MD;  Location: [facility];  Service: Gastroenterology;;   CHOLECYSTECTOMY     2004   COLONOSCOPY N/A 02/24/2013   Procedure: COLONOSCOPY;  Surgeon: Asencion Blacksmith, MD;  Location: [facility];  Service: Endoscopy;  Laterality: N/A;   COLONOSCOPY W/ POLYPECTOMY     COLONOSCOPY WITH PROPOFOL  N/A 12/03/2017   Procedure: COLONOSCOPY WITH PROPOFOL ;  Surgeon: Janel Medford, MD;  Location: [facility];  Service:  Gastroenterology;  Laterality: N/A;   ESOPHAGOGASTRODUODENOSCOPY     ESOPHAGOGASTRODUODENOSCOPY N/A 02/24/2013   Procedure: ESOPHAGOGASTRODUODENOSCOPY (EGD);  Surgeon: Asencion Blacksmith, MD;  Location: [facility];  Service: Endoscopy;  Laterality: N/A;   ESOPHAGOGASTRODUODENOSCOPY (EGD) WITH PROPOFOL  N/A 12/03/2017   Procedure: ESOPHAGOGASTRODUODENOSCOPY (EGD) WITH PROPOFOL ;  Surgeon: Janel Medford, MD;  Location: [facility] ENDOSCOPY;  Service: Gastroenterology;  Laterality: N/A;   ESOPHAGOGASTRODUODENOSCOPY (EGD) WITH PROPOFOL  N/A 03/21/2023   Procedure: ESOPHAGOGASTRODUODENOSCOPY (EGD) WITH PROPOFOL ;  Surgeon: Lajuan Pila, MD;  Location: [facility];  Service: Gastroenterology;  Laterality: N/A;   EYE SURGERY     bilateral catracts   FLEXIBLE SIGMOIDOSCOPY N/A 01/19/2023   Procedure: FLEXIBLE SIGMOIDOSCOPY;  Surgeon: Nannette Babe, MD;  Location: [facility];  Service: Gastroenterology;  Laterality: N/A;   FLEXIBLE SIGMOIDOSCOPY N/A 03/22/2023   Procedure: FLEXIBLE SIGMOIDOSCOPY;  Surgeon: Lajuan Pila, MD;  Location: [facility];  Service: Gastroenterology;  Laterality: N/A;   HOT HEMOSTASIS N/A 03/21/2023   Procedure: HOT HEMOSTASIS (ARGON PLASMA COAGULATION/BICAP);  Surgeon: Lajuan Pila, MD;  Location: [facility];  Service: Gastroenterology;  Laterality: N/A;   IR IMAGING GUIDED PORT INSERTION  03/26/2023    FAMILY HISTORY:  Family History  Problem Relation Age of Onset   Colon polyps Son    Colon cancer Son    Diabetes Other    Esophageal cancer Neg Hx    Stomach cancer Neg Hx    Rectal cancer Neg Hx     SOCIAL HISTORY:  Social History   Tobacco Use   Smoking status: Former    Current packs/day: 0.00    Types: Cigarettes    Start date: 1951    Quit date: 1995    Years since quitting: 30.3   Smokeless tobacco: Never   Tobacco comments:    Quit about 30 years ago  Vaping Use   Vaping status: Never Used  Substance Use Topics   Alcohol  use: No   Drug use: No     ALLERGIES:  Allergies  Allergen Reactions   Aggrenox  [Aspirin -Dipyridamole  Er] Other (See Comments)    Severe Bleeding and Stomach Pain   Prednisone  Other (See Comments)    Climbs the walls    MEDICATIONS:  Current Outpatient Medications  Medication Sig Dispense Refill   acetaminophen  (TYLENOL ) 325 MG tablet Take 2 tablets (650 mg total) by mouth every 6 (six) hours as needed for mild pain (or Fever >/= 101). 30 tablet 0   ALPRAZolam  (XANAX ) 0.5 MG tablet Take 0.5 mg by mouth 3 (three) times daily as needed for anxiety. For anxiety     amiodarone  (PACERONE ) 200 MG tablet Take 0.5 tablets (100 mg total) by mouth daily. 45 tablet 2   amLODipine  (NORVASC ) 10 MG tablet Take 10 mg by mouth daily.     cholecalciferol (VITAMIN D3) 25 MCG (1000 UNIT) tablet Take 1,000 Units by mouth daily.     clotrimazole  (LOTRIMIN ) 1 % cream Apply 1 Application topically 2 (two) times daily. 30 g 0   cyanocobalamin  1000 MCG tablet Take 1 tablet (1,000 mcg total) by mouth daily. 30  tablet 0   escitalopram  (LEXAPRO ) 5 MG tablet Take 5 mg by mouth at bedtime.     famotidine (PEPCID) 20 MG tablet Take 20 mg by mouth daily.     feeding supplement (ENSURE ENLIVE / ENSURE PLUS) LIQD Take 237 mLs by mouth 3 (three) times daily with meals. (Patient taking differently: Take 237 mLs by mouth 3 (three) times daily as needed (nutrition).) 237 mL 12   fluticasone  (FLONASE ) 50 MCG/ACT nasal spray Place 2 sprays into both nostrils daily. 11.1 mL 0   hydrALAZINE  (APRESOLINE ) 25 MG tablet Take 1 tablet (25 mg total) by mouth 2 (two) times daily. 180 tablet 3   levothyroxine  (SYNTHROID ) 175 MCG tablet Take 175 mcg by mouth every morning.     lidocaine -prilocaine  (EMLA ) cream Apply 1 Application topically as needed. 30 g 4   loratadine  (CLARITIN ) 10 MG tablet Take 1 tablet (10 mg total) by mouth daily. 30 tablet 0   polyethylene glycol (MIRALAX  / GLYCOLAX ) 17 g packet Take 17 g by mouth daily as needed for mild constipation.  14 each 0   potassium chloride  SA (K-DUR,KLOR-CON ) 20 MEQ tablet Take 20 mEq by mouth daily.     prochlorperazine  (COMPAZINE ) 10 MG tablet Take 1 tablet (10 mg total) by mouth every 6 (six) hours as needed. 30 tablet 1   triamterene -hydrochlorothiazide  (MAXZIDE-25) 37.5-25 MG tablet Take 1 tablet by mouth daily. 90 tablet 1   TRUE METRIX BLOOD GLUCOSE TEST test strip      No current facility-administered medications for this encounter.    REVIEW OF SYSTEMS:  Notable for that above   PHYSICAL EXAM:  height is 5' (1.524 m) and weight is 169 lb 4 oz (76.8 kg). Her temporal temperature is 97.1 F (36.2 C) (abnormal). Her blood pressure is 159/64 (abnormal) and her pulse is 91. Her respiration is 18 and oxygen  saturation is 94%.   General: Alert and oriented, in no acute distress HEENT: Head is normocephalic. Extraocular movements are intact.  Neck: Neck is supple, no palpable cervical or supraclavicular lymphadenopathy. Heart: Regular in rate and rhythm with no murmurs, rubs, or gallops. Chest: Clear to auscultation bilaterally, with no rhonchi, wheezes, or rales. Abdomen: Soft, nontender, nondistended, with no rigidity or guarding. Extremities: No cyanosis or edema. Lymphatics: see Neck Exam Skin: No concerning lesions. Musculoskeletal: symmetric strength and muscle tone throughout. Neurologic: Cranial nerves II through XII are grossly intact. No obvious focalities. Speech is fluent. Coordination is intact. Psychiatric: Judgment and insight are intact. Affect is appropriate. GU: Normal sphincter tone. Nodular mass palpated 2.5 cm above anocutaneous line.    ECOG = 1  0 - Asymptomatic (Fully active, able to carry on all predisease activities without restriction)  1 - Symptomatic but completely ambulatory (Restricted in physically strenuous activity but ambulatory and able to carry out work of a light or sedentary nature. For example, light housework, office work)  2 - Symptomatic, <50%  in bed during the day (Ambulatory and capable of all self care but unable to carry out any work activities. Up and about more than 50% of waking hours)  3 - Symptomatic, >50% in bed, but not bedbound (Capable of only limited self-care, confined to bed or chair 50% or more of waking hours)  4 - Bedbound (Completely disabled. Cannot carry on any self-care. Totally confined to bed or chair)  5 - Death   Aurea Blossom MM, Creech RH, Tormey DC, et al. [id number]). "Toxicity and response criteria of the [facility]". Am.  J. Clin. Oncol. 5 (6): 649-55  LABORATORY DATA:  Lab Results  Component Value Date   WBC 8.0 01/23/2024   HGB 13.2 01/23/2024   HCT 39.1 01/23/2024   MCV 89.5 01/23/2024   PLT 241 01/23/2024   NEUTROABS 3.7 01/23/2024   Lab Results  Component Value Date   NA 134 (L) 01/23/2024   K 3.1 (L) 01/23/2024   CL 94 (L) 01/23/2024   CO2 29 01/23/2024   GLUCOSE 234 (H) 01/23/2024   BUN 14 01/23/2024   CREATININE 0.86 01/23/2024   CALCIUM  9.4 01/23/2024      RADIOGRAPHY: NM PET Image Initial (PI) Skull Base To Thigh (F-18 FDG) Result Date: 01/11/2024 CLINICAL DATA:  Initial treatment strategy for rectal carcinoma. EXAM: NUCLEAR MEDICINE PET SKULL BASE TO THIGH TECHNIQUE: 8.49 mCi F-18 FDG was injected intravenously. Full-ring PET imaging was performed from the skull base to thigh after the radiotracer. CT data was obtained and used for attenuation correction and anatomic localization. Fasting blood glucose: 145 mg/dl COMPARISON:  MRI pelvis 12/24/2023.  Chest CT 11/21/2023 FINDINGS: Mediastinal blood pool activity: SUV max 3.3 Liver activity: SUV max 3.6 NECK: No specific abnormal uptake seen in the neck including along lymph node change of the submandibular, posterior triangle or internal jugular region. Near symmetric uptake of the visualized intracranial compartment. Incidental CT findings: Paranasal sinuses and mastoid air cells are clear. The parotid glands,  submandibular glands are unremarkable. Atrophic thyroid  gland. CHEST: No abnormal uptake above mediastinal blood pool in the axillary regions, hilum or mediastinum. No abnormal lung uptake. Incidental CT findings: Right IJ chest port. Tip seen to the central SVC above the right atrium. No pericardial effusion. Coronary artery calcifications are seen. Calcifications along the area of the aortic valve. Diffuse vascular calcifications along the thoracic aorta as well. Small hiatal hernia. Slightly patulous thoracic esophagus. Breathing motion. No consolidation, pneumothorax or effusion. Apical pleural thickening. Emphysematous lung changes identified. Tiny lung nodule seen on the prior chest CT are again noted such as right upper lobe on series 7, image 17 measuring 3 mm. Additional punctate foci elsewhere. There is no associated abnormal uptake today but these are quite small and could be below threshold. Larger focus seen posterior hilum right upper lobe measuring 6 x 4 mm is also seen today and appears similar when adjusted for technique on image 26 of series 7. Again no abnormal uptake. ABDOMEN/PELVIS: Abnormal uptake identified along the low rectum crescentic on the left side with maximum SUV value of 17.3. This corresponds to the finding by MRI. Otherwise there is physiologic distribution radiotracer along the parenchymal organs, bowel and renal collecting systems. Incidental CT findings: Previous cholecystectomy. Grossly the adrenal glands, pancreas, spleen are. No abnormal calcification seen within either kidney nor along the course of either ureter. Small lower pole posterior right-sided renal cyst. Moderate calcified plaque identified along the aorta and branch vessels. Severe area along the aortic bifurcation and common iliac arteries. Please correlate with prior CTA of 03/19/2023 describing vessel occlusion on the right and high-grade left-sided stenosis. Please correlate with symptoms. Visualized bowel is  nondilated. Scattered colonic stool. Small fat containing umbilical hernia. No obvious free fluid. SKELETON: There is a focal area of uptake identified along the junction of the right first rib to the manubrium of the sternum with some mild hypertrophic changes in this location. Uptake maximum SUV 5.5. No other areas of abnormal uptake identified along the visualized osseous structures. Scattered degenerative changes. Stable compression deformity of T9 vertebral body.  Anterolisthesis of L5 on S1. IMPRESSION: Significant abnormal uptake along the left side of the low rectum corresponding to the mass lesion by prior MRI. No additional areas of abnormal uptake at this time to suggest metastatic disease. Small lung nodules are again seen and are not hypermetabolic but are quite small. Would recommend continued follow up surveillance with CT. Asymmetric uptake along the junction of the right first rib to the manubrium with some hypertrophic changes and sclerosis of uncertain etiology and significance. Please correlate with specific symptoms and if needed further workup. Significant atherosclerotic calcified plaque diffusely. 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 Please correlate with any particular known symptoms and history. Small hiatal hernia. Electronically Signed   By: Adrianna Horde M.D.   On: 01/11/2024 16:23      IMPRESSION: Recurrent stage IIA (cT3, N0, M0) rectal adenocarcinoma  It was a pleasure meeting this patient today. Imaging demonstrates locoregional disease recurrence within the rectum; biopsy confirms adenocarcinoma. Fortunately PET shows no evidence of distant metastases.  Dr. Eloise Hake recommends 5 weeks of radiation given concurrently with chemotherapy to the posterior pelvis followed by a boost to the tumor in the distal rectum for an additional week of treatment.  Today, I talked to the patient and family about the findings and work-up  thus far.  We discussed the natural history of recurrent rectal carcinoma and general treatment, highlighting the role of radiotherapy in the management.  We discussed the available radiation techniques, and focused on the details of logistics and delivery.  We reviewed the anticipated acute and late sequelae associated with radiation in this setting.  The patient was encouraged to ask questions that I answered to the best of my ability.  A patient consent form was discussed and signed.  We retained a copy for our records.  The patient would like to proceed with radiation and will be scheduled for CT simulation.  PLAN: Patient is scheduled for CT simulation on 01/29/2024. Anticipate 6 weeks of radiation directed at the rectal mass. We look forward to participating in this patient's care.     60 minutes of total time was spent for this patient encounter, including preparation, face-to-face counseling with the patient and coordination of care, physical exam, and documentation of the encounter.   ------------------------------------------------   Julio Ohm, PA-C   Noralee Beam, PhD, MD   [facility]  Radiation Oncology Direct Dial: [phone]  Fax: [phone] [email or web address]    This document serves as a record of services personally performed by Retta Caster, MD and Julio Ohm, PA-C. It was created on his behalf by Lucky Sable, a trained medical scribe. The creation of this record is based on the scribe's personal observations and the provider's statements to them. This document has been checked and approved by the attending provider.

## 2024-01-25 ENCOUNTER — Encounter: Payer: Self-pay | Admitting: Hematology & Oncology

## 2024-01-25 ENCOUNTER — Other Ambulatory Visit: Payer: Self-pay

## 2024-01-26 ENCOUNTER — Encounter: Payer: Self-pay | Admitting: Hematology & Oncology

## 2024-01-26 ENCOUNTER — Other Ambulatory Visit: Payer: Self-pay

## 2024-01-28 ENCOUNTER — Encounter: Payer: Self-pay | Admitting: Hematology & Oncology

## 2024-01-28 ENCOUNTER — Ambulatory Visit: Payer: Self-pay | Admitting: Gastroenterology

## 2024-01-28 ENCOUNTER — Inpatient Hospital Stay

## 2024-01-28 VITALS — BP 153/54 | HR 74 | Temp 97.7°F | Resp 20

## 2024-01-28 DIAGNOSIS — C20 Malignant neoplasm of rectum: Secondary | ICD-10-CM

## 2024-01-28 DIAGNOSIS — Z510 Encounter for antineoplastic radiation therapy: Secondary | ICD-10-CM | POA: Diagnosis not present

## 2024-01-28 LAB — CBC WITH DIFFERENTIAL (CANCER CENTER ONLY)
Abs Immature Granulocytes: 0.04 10*3/uL (ref 0.00–0.07)
Basophils Absolute: 0.1 10*3/uL (ref 0.0–0.1)
Basophils Relative: 1 %
Eosinophils Absolute: 0.2 10*3/uL (ref 0.0–0.5)
Eosinophils Relative: 3 %
HCT: 39.2 % (ref 36.0–46.0)
Hemoglobin: 13.1 g/dL (ref 12.0–15.0)
Immature Granulocytes: 1 %
Lymphocytes Relative: 42 %
Lymphs Abs: 3.2 10*3/uL (ref 0.7–4.0)
MCH: 29.8 pg (ref 26.0–34.0)
MCHC: 33.4 g/dL (ref 30.0–36.0)
MCV: 89.1 fL (ref 80.0–100.0)
Monocytes Absolute: 0.8 10*3/uL (ref 0.1–1.0)
Monocytes Relative: 10 %
Neutro Abs: 3.4 10*3/uL (ref 1.7–7.7)
Neutrophils Relative %: 43 %
Platelet Count: 236 10*3/uL (ref 150–400)
RBC: 4.4 MIL/uL (ref 3.87–5.11)
RDW: 14.2 % (ref 11.5–15.5)
WBC Count: 7.7 10*3/uL (ref 4.0–10.5)
nRBC: 0 % (ref 0.0–0.2)

## 2024-01-28 LAB — CMP (CANCER CENTER ONLY)
ALT: 18 U/L (ref 0–44)
AST: 15 U/L (ref 15–41)
Albumin: 4.6 g/dL (ref 3.5–5.0)
Alkaline Phosphatase: 79 U/L (ref 38–126)
Anion gap: 9 (ref 5–15)
BUN: 16 mg/dL (ref 8–23)
CO2: 29 mmol/L (ref 22–32)
Calcium: 9.4 mg/dL (ref 8.9–10.3)
Chloride: 96 mmol/L — ABNORMAL LOW (ref 98–111)
Creatinine: 0.78 mg/dL (ref 0.44–1.00)
GFR, Estimated: >60 mL/min
Glucose, Bld: 147 mg/dL — ABNORMAL HIGH (ref 70–99)
Potassium: 3.4 mmol/L — ABNORMAL LOW (ref 3.5–5.1)
Sodium: 134 mmol/L — ABNORMAL LOW (ref 135–145)
Total Bilirubin: 0.5 mg/dL (ref 0.0–1.2)
Total Protein: 6.8 g/dL (ref 6.5–8.1)

## 2024-01-28 MED ORDER — SODIUM CHLORIDE 0.9 % IV SOLN: INTRAVENOUS | Status: DC

## 2024-01-28 MED ORDER — DEXTROSE 5 % IV SOLN: INTRAVENOUS | Status: DC

## 2024-01-28 MED ORDER — SODIUM CHLORIDE 0.9 % IV SOLN
2000.0000 mg | INTRAVENOUS | Status: DC
  Administered 2024-01-28: 2000 mg via INTRAVENOUS
  Filled 2024-01-28: qty 40

## 2024-01-28 MED ORDER — LEUCOVORIN CALCIUM INJECTION 350 MG
200.0000 mg/m2 | Freq: Once | INTRAVENOUS | Status: AC
Start: 2024-01-28 — End: 2024-01-28
  Administered 2024-01-28: 360 mg via INTRAVENOUS
  Filled 2024-01-28: qty 18

## 2024-01-28 MED ORDER — LIDOCAINE-PRILOCAINE 2.5-2.5 % EX CREA: TOPICAL_CREAM | CUTANEOUS | 3 refills | Status: DC

## 2024-01-28 MED ORDER — PROCHLORPERAZINE MALEATE 10 MG PO TABS: 10.0000 mg | ORAL_TABLET | Freq: Four times a day (QID) | ORAL | 1 refills | Status: DC | PRN

## 2024-01-28 MED ORDER — DEXTROSE 5 % IV SOLN
40.0000 mg/m2 | Freq: Once | INTRAVENOUS | Status: AC
  Administered 2024-01-28: 70 mg via INTRAVENOUS
  Filled 2024-01-28: qty 14

## 2024-01-28 MED ORDER — PALONOSETRON HCL INJECTION 0.25 MG/5ML
0.2500 mg | Freq: Once | INTRAVENOUS | Status: AC
  Administered 2024-01-28: 0.25 mg via INTRAVENOUS
  Filled 2024-01-28: qty 5

## 2024-01-28 MED ORDER — ONDANSETRON HCL 8 MG PO TABS
8.0000 mg | ORAL_TABLET | Freq: Three times a day (TID) | ORAL | 1 refills | Status: DC | PRN
Start: 2024-01-28 — End: 2024-07-31

## 2024-01-28 MED ORDER — DEXAMETHASONE SODIUM PHOSPHATE 10 MG/ML IJ SOLN
5.0000 mg | Freq: Once | INTRAMUSCULAR | Status: AC
  Administered 2024-01-28: 5 mg via INTRAVENOUS
  Filled 2024-01-28: qty 1

## 2024-01-28 NOTE — Progress Notes (Signed)
 5FU CIV this week will be 225mg /m2/day x 5 days infused over 96 hours. Pump DC will be on 02/01/24 at [facility].  Jobe Mulder [place], [place], BCPS, BCOP 01/28/2024 12:15 PM

## 2024-01-28 NOTE — Patient Instructions (Signed)
 [facility] - A DEPT OF [facility]  Discharge Instructions: Thank you for choosing [facility] to provide your oncology and hematology care.   If you have a lab appointment with the Cancer Center, please go directly to the Cancer Center and check in at the registration area.  Wear comfortable clothing and clothing appropriate for easy access to any Portacath or PICC line.   We strive to give you quality time with your provider. You may need to reschedule your appointment if you arrive late (15 or more minutes).  Arriving late affects you and other patients whose appointments are after yours.  Also, if you miss three or more appointments without notifying the office, you may be dismissed from the clinic at the provider's discretion.      For prescription refill requests, have your pharmacy contact our office and allow 72 hours for refills to be completed.    Today you received the following chemotherapy and/or immunotherapy agents:  Oxaliplatin , Leucovorin  and 5FU.       To help prevent nausea and vomiting after your treatment, we encourage you to take your nausea medication as directed.  BELOW ARE SYMPTOMS THAT SHOULD BE REPORTED IMMEDIATELY: *FEVER GREATER THAN 100.4 F (38 C) OR HIGHER *CHILLS OR SWEATING *NAUSEA AND VOMITING THAT IS NOT CONTROLLED WITH YOUR NAUSEA MEDICATION *UNUSUAL SHORTNESS OF BREATH *UNUSUAL BRUISING OR BLEEDING *URINARY PROBLEMS (pain or burning when urinating, or frequent urination) *BOWEL PROBLEMS (unusual diarrhea, constipation, pain near the anus) TENDERNESS IN MOUTH AND THROAT WITH OR WITHOUT PRESENCE OF ULCERS (sore throat, sores in mouth, or a toothache) UNUSUAL RASH, SWELLING OR PAIN  UNUSUAL VAGINAL DISCHARGE OR ITCHING   Items with * indicate a potential emergency and should be followed up as soon as possible or go to the Emergency Department if any problems should occur.  Please show the CHEMOTHERAPY  ALERT CARD or IMMUNOTHERAPY ALERT CARD at check-in to the Emergency Department and triage nurse. Should you have questions after your visit or need to cancel or reschedule your appointment, please contact [facility] - A DEPT OF [facility]  [phone] and follow the prompts.  Office hours are 8:00 a.m. to 4:30 p.m. Monday - Friday. Please note that voicemails left after 4:00 p.m. may not be returned until the following business day.  We are closed weekends and major holidays. You have access to a nurse at all times for urgent questions. Please call the main number to the clinic [phone] and follow the prompts.  For any non-urgent questions, you may also contact your provider using MyChart. We now offer e-Visits for anyone 59 and older to request care online for non-urgent symptoms. For details visit [email or web address].   Also download the MyChart app! Go to the app store, search "MyChart", open the app, select [place], and log in with your MyChart username and password.

## 2024-01-28 NOTE — Patient Instructions (Signed)

## 2024-01-28 NOTE — Progress Notes (Signed)
 Pharmacist Chemotherapy Monitoring - Initial Assessment    Anticipated start date: 01/28/24   The following has been reviewed per standard work regarding the patient's treatment regimen: The patient's diagnosis, treatment plan and drug doses, and organ/hematologic function Lab orders and baseline tests specific to treatment regimen  The treatment plan start date, drug sequencing, and pre-medications Prior authorization status  Patient's documented medication list, including drug-drug interaction screen and prescriptions for anti-emetics and supportive care specific to the treatment regimen The drug concentrations, fluid compatibility, administration routes, and timing of the medications to be used The patient's access for treatment and lifetime cumulative dose history, if applicable  The patient's medication allergies and previous infusion related reactions, if applicable   Changes made to treatment plan:  Aloxi  and Dex premeds added due to the addition of Oxaliplatin  to the plan. LV 200mg /m2 added to regimen. Will f/u w MD to continue LV w future treatments. Dextrose  hydration orders added due to Oxaliplatin .  Follow up needed:  1) FU Oxaliplatin  frequency 2) LV dose and continuation   Elodia Hailstone, RPH, BCPS, BCOP 01/28/2024  11:01 AM

## 2024-01-29 ENCOUNTER — Ambulatory Visit
Admission: RE | Admit: 2024-01-29 | Discharge: 2024-01-29 | Disposition: A | Discharge status: Home / Self Care | Source: Ambulatory Visit | Attending: Radiation Oncology | Admitting: Radiation Oncology

## 2024-01-29 ENCOUNTER — Encounter: Payer: Self-pay | Admitting: Radiation Oncology

## 2024-01-29 ENCOUNTER — Other Ambulatory Visit: Payer: Self-pay

## 2024-01-29 DIAGNOSIS — C20 Malignant neoplasm of rectum: Secondary | ICD-10-CM | POA: Insufficient documentation

## 2024-01-29 DIAGNOSIS — Z510 Encounter for antineoplastic radiation therapy: Secondary | ICD-10-CM | POA: Insufficient documentation

## 2024-01-31 ENCOUNTER — Encounter: Payer: Self-pay | Admitting: Hematology & Oncology

## 2024-02-01 ENCOUNTER — Inpatient Hospital Stay

## 2024-02-01 ENCOUNTER — Other Ambulatory Visit: Payer: Self-pay | Admitting: *Deleted

## 2024-02-01 ENCOUNTER — Encounter

## 2024-02-01 VITALS — BP 158/69 | HR 81 | Temp 98.2°F | Resp 18

## 2024-02-01 DIAGNOSIS — D509 Iron deficiency anemia, unspecified: Secondary | ICD-10-CM

## 2024-02-01 DIAGNOSIS — R530 Neoplastic (malignant) related fatigue: Secondary | ICD-10-CM

## 2024-02-01 DIAGNOSIS — Z860100 Personal history of colon polyps, unspecified: Secondary | ICD-10-CM

## 2024-02-01 DIAGNOSIS — D500 Iron deficiency anemia secondary to blood loss (chronic): Secondary | ICD-10-CM

## 2024-02-01 DIAGNOSIS — C20 Malignant neoplasm of rectum: Secondary | ICD-10-CM

## 2024-02-01 DIAGNOSIS — R0782 Intercostal pain: Secondary | ICD-10-CM

## 2024-02-01 DIAGNOSIS — Z7189 Other specified counseling: Secondary | ICD-10-CM

## 2024-02-01 DIAGNOSIS — K625 Hemorrhage of anus and rectum: Secondary | ICD-10-CM

## 2024-02-01 DIAGNOSIS — R195 Other fecal abnormalities: Secondary | ICD-10-CM

## 2024-02-01 DIAGNOSIS — E032 Hypothyroidism due to medicaments and other exogenous substances: Secondary | ICD-10-CM

## 2024-02-01 DIAGNOSIS — K922 Gastrointestinal hemorrhage, unspecified: Secondary | ICD-10-CM

## 2024-02-01 DIAGNOSIS — Z515 Encounter for palliative care: Secondary | ICD-10-CM

## 2024-02-01 DIAGNOSIS — Z15068 Genetic susceptibility to other malignant neoplasm of digestive system: Secondary | ICD-10-CM

## 2024-02-01 DIAGNOSIS — J188 Other pneumonia, unspecified organism: Secondary | ICD-10-CM

## 2024-02-01 DIAGNOSIS — J189 Pneumonia, unspecified organism: Secondary | ICD-10-CM

## 2024-02-01 DIAGNOSIS — Z95828 Presence of other vascular implants and grafts: Secondary | ICD-10-CM

## 2024-02-01 DIAGNOSIS — R630 Anorexia: Secondary | ICD-10-CM

## 2024-02-01 DIAGNOSIS — Z1509 Genetic susceptibility to other malignant neoplasm: Secondary | ICD-10-CM

## 2024-02-01 DIAGNOSIS — E038 Other specified hypothyroidism: Secondary | ICD-10-CM

## 2024-02-01 DIAGNOSIS — R197 Diarrhea, unspecified: Secondary | ICD-10-CM

## 2024-02-01 MED ORDER — HEPARIN SOD (PORK) LOCK FLUSH 100 UNIT/ML IV SOLN: 250.0000 [IU] | Freq: Once | INTRAVENOUS | Status: DC

## 2024-02-01 MED ORDER — SODIUM CHLORIDE 0.9% FLUSH
10.0000 mL | Freq: Once | INTRAVENOUS | Status: AC
  Administered 2024-02-01: 10 mL

## 2024-02-01 NOTE — Progress Notes (Signed)
 Patient here for pump D/C.  Thought patient was her for port flush/labs.  Released CBC, CMP, Ferritin, Iron and Iron binding.  Called lab and spoke with [facility].  Informed her to cancel the labs.  Secure chatted Dr. Maria Shiner to inform him of the above.  His nurse Sherian Dimitri Toby/RN responded and states she will take care of it.

## 2024-02-05 ENCOUNTER — Ambulatory Visit: Admitting: Radiation Oncology

## 2024-02-05 ENCOUNTER — Inpatient Hospital Stay

## 2024-02-05 ENCOUNTER — Other Ambulatory Visit: Payer: Self-pay

## 2024-02-05 ENCOUNTER — Encounter: Payer: Self-pay | Admitting: Hematology & Oncology

## 2024-02-05 ENCOUNTER — Inpatient Hospital Stay (HOSPITAL_BASED_OUTPATIENT_CLINIC_OR_DEPARTMENT_OTHER): Admitting: Hematology & Oncology

## 2024-02-05 VITALS — BP 142/50 | HR 68 | Temp 97.8°F | Resp 16 | Ht 60.0 in | Wt 170.0 lb

## 2024-02-05 DIAGNOSIS — R195 Other fecal abnormalities: Secondary | ICD-10-CM

## 2024-02-05 DIAGNOSIS — D509 Iron deficiency anemia, unspecified: Secondary | ICD-10-CM

## 2024-02-05 DIAGNOSIS — C20 Malignant neoplasm of rectum: Secondary | ICD-10-CM

## 2024-02-05 DIAGNOSIS — Z95828 Presence of other vascular implants and grafts: Secondary | ICD-10-CM

## 2024-02-05 DIAGNOSIS — E032 Hypothyroidism due to medicaments and other exogenous substances: Secondary | ICD-10-CM

## 2024-02-05 DIAGNOSIS — Z860100 Personal history of colon polyps, unspecified: Secondary | ICD-10-CM

## 2024-02-05 DIAGNOSIS — K625 Hemorrhage of anus and rectum: Secondary | ICD-10-CM

## 2024-02-05 DIAGNOSIS — R630 Anorexia: Secondary | ICD-10-CM

## 2024-02-05 DIAGNOSIS — Z515 Encounter for palliative care: Secondary | ICD-10-CM

## 2024-02-05 DIAGNOSIS — R197 Diarrhea, unspecified: Secondary | ICD-10-CM

## 2024-02-05 DIAGNOSIS — D500 Iron deficiency anemia secondary to blood loss (chronic): Secondary | ICD-10-CM

## 2024-02-05 DIAGNOSIS — K922 Gastrointestinal hemorrhage, unspecified: Secondary | ICD-10-CM

## 2024-02-05 DIAGNOSIS — R0782 Intercostal pain: Secondary | ICD-10-CM

## 2024-02-05 DIAGNOSIS — J189 Pneumonia, unspecified organism: Secondary | ICD-10-CM

## 2024-02-05 DIAGNOSIS — Z7189 Other specified counseling: Secondary | ICD-10-CM

## 2024-02-05 DIAGNOSIS — R530 Neoplastic (malignant) related fatigue: Secondary | ICD-10-CM

## 2024-02-05 DIAGNOSIS — Z510 Encounter for antineoplastic radiation therapy: Secondary | ICD-10-CM | POA: Diagnosis not present

## 2024-02-05 DIAGNOSIS — E038 Other specified hypothyroidism: Secondary | ICD-10-CM

## 2024-02-05 DIAGNOSIS — Z1509 Genetic susceptibility to other malignant neoplasm: Secondary | ICD-10-CM

## 2024-02-05 LAB — CMP (CANCER CENTER ONLY)
ALT: 16 U/L (ref 0–44)
AST: 14 U/L — ABNORMAL LOW (ref 15–41)
Albumin: 4.5 g/dL (ref 3.5–5.0)
Alkaline Phosphatase: 86 U/L (ref 38–126)
Anion gap: 9 (ref 5–15)
BUN: 16 mg/dL (ref 8–23)
CO2: 30 mmol/L (ref 22–32)
Calcium: 9.5 mg/dL (ref 8.9–10.3)
Chloride: 94 mmol/L — ABNORMAL LOW (ref 98–111)
Creatinine: 0.94 mg/dL (ref 0.44–1.00)
GFR, Estimated: 57 mL/min — ABNORMAL LOW (ref >60)
Glucose, Bld: 164 mg/dL — ABNORMAL HIGH (ref 70–99)
Potassium: 3.5 mmol/L (ref 3.5–5.1)
Sodium: 133 mmol/L — ABNORMAL LOW (ref 135–145)
Total Bilirubin: 0.5 mg/dL (ref 0.0–1.2)
Total Protein: 6.5 g/dL (ref 6.5–8.1)

## 2024-02-05 LAB — CBC WITH DIFFERENTIAL (CANCER CENTER ONLY)
Abs Immature Granulocytes: 0.08 10*3/uL — ABNORMAL HIGH (ref 0.00–0.07)
Basophils Absolute: 0.1 10*3/uL (ref 0.0–0.1)
Basophils Relative: 1 %
Eosinophils Absolute: 0.2 10*3/uL (ref 0.0–0.5)
Eosinophils Relative: 3 %
HCT: 38.3 % (ref 36.0–46.0)
Hemoglobin: 13 g/dL (ref 12.0–15.0)
Immature Granulocytes: 1 %
Lymphocytes Relative: 37 %
Lymphs Abs: 2.9 10*3/uL (ref 0.7–4.0)
MCH: 30.5 pg (ref 26.0–34.0)
MCHC: 33.9 g/dL (ref 30.0–36.0)
MCV: 89.9 fL (ref 80.0–100.0)
Monocytes Absolute: 0.7 10*3/uL (ref 0.1–1.0)
Monocytes Relative: 9 %
Neutro Abs: 4 10*3/uL (ref 1.7–7.7)
Neutrophils Relative %: 49 %
Platelet Count: 257 10*3/uL (ref 150–400)
RBC: 4.26 MIL/uL (ref 3.87–5.11)
RDW: 14.3 % (ref 11.5–15.5)
WBC Count: 8 10*3/uL (ref 4.0–10.5)
nRBC: 0 % (ref 0.0–0.2)

## 2024-02-05 LAB — IRON AND IRON BINDING CAPACITY (CC-WL,HP ONLY)
Iron: 79 ug/dL (ref 28–170)
Saturation Ratios: 21 % (ref 10.4–31.8)
TIBC: 370 ug/dL (ref 250–450)
UIBC: 291 ug/dL (ref 148–442)

## 2024-02-05 LAB — FERRITIN: Ferritin: 189 ng/mL (ref 11–307)

## 2024-02-05 MED ORDER — DEXTROSE 5 % IV SOLN: 200.0000 mg/m2 | Freq: Once | INTRAVENOUS | Status: DC

## 2024-02-05 MED ORDER — SODIUM CHLORIDE 0.9 % IV SOLN
225.0000 mg/m2/d | INTRAVENOUS | Status: DC
  Administered 2024-02-05: 1500 mg via INTRAVENOUS
  Filled 2024-02-05: qty 30

## 2024-02-05 MED ORDER — DEXTROSE 5 % IV SOLN: INTRAVENOUS | Status: DC

## 2024-02-05 MED ORDER — PALONOSETRON HCL INJECTION 0.25 MG/5ML
0.2500 mg | Freq: Once | INTRAVENOUS | Status: AC
  Administered 2024-02-05: 0.25 mg via INTRAVENOUS
  Filled 2024-02-05: qty 5

## 2024-02-05 MED ORDER — DEXAMETHASONE SODIUM PHOSPHATE 10 MG/ML IJ SOLN
5.0000 mg | Freq: Once | INTRAMUSCULAR | Status: AC
  Administered 2024-02-05: 5 mg via INTRAVENOUS
  Filled 2024-02-05: qty 1

## 2024-02-05 MED ORDER — OXALIPLATIN CHEMO INJECTION 100 MG/20ML
40.0000 mg/m2 | Freq: Once | INTRAVENOUS | Status: AC
  Administered 2024-02-05: 70 mg via INTRAVENOUS
  Filled 2024-02-05: qty 14

## 2024-02-05 NOTE — Patient Instructions (Signed)
 [facility] - A DEPT OF [place]  Discharge Instructions: Thank you for choosing [facility] to provide your oncology and hematology care.   If you have a lab appointment with the Cancer Center, please go directly to the Cancer Center and check in at the registration area.  Wear comfortable clothing and clothing appropriate for easy access to any Portacath or PICC line.   We strive to give you quality time with your provider. You may need to reschedule your appointment if you arrive late (15 or more minutes).  Arriving late affects you and other patients whose appointments are after yours.  Also, if you miss three or more appointments without notifying the office, you may be dismissed from the clinic at the provider's discretion.      For prescription refill requests, have your pharmacy contact our office and allow 72 hours for refills to be completed.    Today you received the following chemotherapy and/or immunotherapy agents:  Oxaliplatin , Leucovorin  and 5FU      To help prevent nausea and vomiting after your treatment, we encourage you to take your nausea medication as directed.  BELOW ARE SYMPTOMS THAT SHOULD BE REPORTED IMMEDIATELY: *FEVER GREATER THAN 100.4 F (38 C) OR HIGHER *CHILLS OR SWEATING *NAUSEA AND VOMITING THAT IS NOT CONTROLLED WITH YOUR NAUSEA MEDICATION *UNUSUAL SHORTNESS OF BREATH *UNUSUAL BRUISING OR BLEEDING *URINARY PROBLEMS (pain or burning when urinating, or frequent urination) *BOWEL PROBLEMS (unusual diarrhea, constipation, pain near the anus) TENDERNESS IN MOUTH AND THROAT WITH OR WITHOUT PRESENCE OF ULCERS (sore throat, sores in mouth, or a toothache) UNUSUAL RASH, SWELLING OR PAIN  UNUSUAL VAGINAL DISCHARGE OR ITCHING   Items with * indicate a potential emergency and should be followed up as soon as possible or go to the Emergency Department if any problems should occur.  Please show the CHEMOTHERAPY ALERT  CARD or IMMUNOTHERAPY ALERT CARD at check-in to the Emergency Department and triage nurse. Should you have questions after your visit or need to cancel or reschedule your appointment, please contact [facility] - A DEPT OF [facility]  [phone] and follow the prompts.  Office hours are 8:00 a.m. to 4:30 p.m. Monday - Friday. Please note that voicemails left after 4:00 p.m. may not be returned until the following business day.  We are closed weekends and major holidays. You have access to a nurse at all times for urgent questions. Please call the main number to the clinic [phone] and follow the prompts.  For any non-urgent questions, you may also contact your provider using MyChart. We now offer e-Visits for anyone 60 and older to request care online for non-urgent symptoms. For details visit [email or web address].   Also download the MyChart app! Go to the app store, search "MyChart", open the app, select [place], and log in with your MyChart username and password.

## 2024-02-05 NOTE — Progress Notes (Signed)
 Hematology and Oncology Follow Up Visit  [name] [id number] 1928/12/21 89 y.o. 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 3 weeks-s/p cycle #1  -- start on 02/09/2023 -- d/c on 04/12/2023 Xeloda  1500 mg po BID (14/7) -- s/p cycle #6 on 02/13/2023 -- changed to 1000mg  po BID on 08/31/2023 --on hold since 08/2023  5-FU/Oxaliplatin  +XRT -- s/p cycle #1 -start on 01/28/2024     Interim History:  [name] is back for follow-up.  We had started her on low-dose chemotherapy.  She tolerated this well.  She will start her radiation therapy this week.  She had limited constipation which is surprising.  She took some MiraLAX .  She has had no problems with pain.  She says there is still a little bit of rectal bleeding but not much.  She has had no fever.  There has been no rashes.  She has had no leg swelling.  She has had no joint issues.  She has had no neuropathy.  Overall, I would have to say that her performance status is probably ECOG 1.   Medications:  Current Outpatient Medications:    acetaminophen  (TYLENOL ) 325 MG tablet, Take 2 tablets (650 mg total) by mouth every 6 (six) hours as needed for mild pain (or Fever >/= 101)., Disp: 30 tablet, Rfl: 0   ALPRAZolam  (XANAX ) 0.5 MG tablet, Take 0.5 mg by mouth 3 (three) times daily as needed for anxiety. For anxiety, Disp: , Rfl:    amiodarone  (PACERONE ) 200 MG tablet, Take 0.5 tablets (100 mg total) by mouth daily., Disp: 45 tablet, Rfl: 2   amLODipine  (NORVASC ) 10 MG tablet, Take 10 mg by mouth daily., Disp: , Rfl:    cholecalciferol (VITAMIN D3) 25 MCG (1000 UNIT) tablet, Take 1,000 Units by mouth daily., Disp: , Rfl:    clotrimazole  (LOTRIMIN ) 1 % cream, Apply 1 Application topically 2 (two) times daily., Disp: 30 g, Rfl: 0   cyanocobalamin  1000 MCG tablet, Take 1 tablet (1,000 mcg total) by mouth daily.,  Disp: 30 tablet, Rfl: 0   escitalopram  (LEXAPRO ) 5 MG tablet, Take 5 mg by mouth at bedtime., Disp: , Rfl:    famotidine (PEPCID) 20 MG tablet, Take 20 mg by mouth daily., Disp: , Rfl:    feeding supplement (ENSURE ENLIVE / ENSURE PLUS) LIQD, Take 237 mLs by mouth 3 (three) times daily with meals. (Patient taking differently: Take 237 mLs by mouth 3 (three) times daily as needed (nutrition).), Disp: 237 mL, Rfl: 12   fluticasone  (FLONASE ) 50 MCG/ACT nasal spray, Place 2 sprays into both nostrils daily., Disp: 11.1 mL, Rfl: 0   hydrALAZINE  (APRESOLINE ) 25 MG tablet, Take 1 tablet (25 mg total) by mouth 2 (two) times daily., Disp: 180 tablet, Rfl: 3   levothyroxine  (SYNTHROID ) 175 MCG tablet, Take 175 mcg by mouth every morning., Disp: , Rfl:    lidocaine -prilocaine  (EMLA ) cream, Apply 1 Application topically as needed., Disp: 30 g, Rfl: 4   lidocaine -prilocaine  (EMLA ) cream, Apply to affected area once, Disp: 30 g, Rfl: 3   loratadine  (CLARITIN ) 10 MG tablet, Take 1 tablet (10 mg total) by mouth daily., Disp: 30 tablet, Rfl: 0   ondansetron  (ZOFRAN ) 8 MG tablet, Take 1 tablet (8 mg total) by mouth every 8 (eight) hours as needed for nausea or vomiting., Disp: 30 tablet, Rfl: 1   polyethylene glycol (MIRALAX  / GLYCOLAX ) 17 g packet, Take  17 g by mouth daily as needed for mild constipation., Disp: 14 each, Rfl: 0   potassium chloride  SA (K-DUR,KLOR-CON ) 20 MEQ tablet, Take 20 mEq by mouth daily., Disp: , Rfl:    prochlorperazine  (COMPAZINE ) 10 MG tablet, Take 1 tablet (10 mg total) by mouth every 6 (six) hours as needed., Disp: 30 tablet, Rfl: 1   prochlorperazine  (COMPAZINE ) 10 MG tablet, Take 1 tablet (10 mg total) by mouth every 6 (six) hours as needed for nausea or vomiting., Disp: 30 tablet, Rfl: 1   triamterene -hydrochlorothiazide  (MAXZIDE-25) 37.5-25 MG tablet, Take 1 tablet by mouth daily., Disp: 90 tablet, Rfl: 1   TRUE METRIX BLOOD GLUCOSE TEST test strip, , Disp: , Rfl:   Allergies:   Allergies  Allergen Reactions   Aggrenox  [Aspirin -Dipyridamole  Er] Other (See Comments)    Severe Bleeding and Stomach Pain   Prednisone  Other (See Comments)    Climbs the walls    Past Medical History, Surgical history, Social history, and Family History were reviewed and updated.  Review of Systems: Review of Systems  Constitutional:  Positive for fatigue.  HENT:  Negative.    Eyes: Negative.   Respiratory: Negative.    Cardiovascular:  Positive for palpitations.  Gastrointestinal:  Positive for blood in stool and rectal pain.  Endocrine: Negative.   Genitourinary: Negative.    Musculoskeletal: Negative.   Skin: Negative.   Neurological:  Positive for light-headedness.  Hematological: Negative.   Psychiatric/Behavioral: Negative.      Physical Exam: Her vital signs show temperature of 97.8.  Pulse 68.  Blood pressure 142/50.  .  Weight is 170 pounds.     Wt Readings from Last 3 Encounters:  01/24/24 169 lb 4 oz (76.8 kg)  01/23/24 168 lb 3.2 oz (76.3 kg)  01/09/24 170 lb (77.1 kg)    Physical Exam Vitals reviewed.  HENT:     Head: Normocephalic and atraumatic.  Eyes:     Pupils: Pupils are equal, round, and reactive to light.  Cardiovascular:     Rate and Rhythm: Normal rate and regular rhythm.     Heart sounds: Normal heart sounds.     Comments: Cardiac exam is regular rate and rhythm.  She has no murmurs, rubs or bruits. Pulmonary:     Effort: Pulmonary effort is normal.     Breath sounds: Normal breath sounds.     Comments: She has good air movement bilaterally.  She has some crackles bilaterally.  I hear no wheezes. Abdominal:     General: Bowel sounds are normal.     Palpations: Abdomen is soft.  Musculoskeletal:        General: No tenderness or deformity. Normal range of motion.     Cervical back: Normal range of motion.  Lymphadenopathy:     Cervical: No cervical adenopathy.  Skin:    General: Skin is warm and dry.     Findings: No erythema or  rash.  Neurological:     Mental Status: She is alert and oriented to person, place, and time.  Psychiatric:        Behavior: Behavior normal.        Thought Content: Thought content normal.        Judgment: Judgment normal.     Lab Results  Component Value Date   WBC 8.0 02/05/2024   HGB 13.0 02/05/2024   HCT 38.3 02/05/2024   MCV 89.9 02/05/2024   PLT 257 02/05/2024     Chemistry  Component Value Date/Time   NA 134 (L) 01/28/2024 0952   K 3.4 (L) 01/28/2024 0952   CL 96 (L) 01/28/2024 0952   CO2 29 01/28/2024 0952   BUN 16 01/28/2024 0952   CREATININE 0.78 01/28/2024 0952      Component Value Date/Time   CALCIUM  9.4 01/28/2024 0952   ALKPHOS 79 01/28/2024 0952   AST 15 01/28/2024 0952   ALT 18 01/28/2024 0952   BILITOT 0.5 01/28/2024 0952      Impression and Plan: Ms. Gaeta is a very charming 89 year old white female.  She has at least a locally advanced adenocarcinoma of the rectum.-I would have to say that she has had metastatic disease since the recent CT scan that she had done did not show any evidence of the pulmonary nodule.  She now has recurrence.  I suspect that his recurrence is local..  I feel that the PET scan helps confirm that this is a local recurrence.  I know that she really had a hard time with Xeloda .  As such, we may have to consider 5-FU pump.  I realize this is more of a aggravation but I think would be safer for her and will hopefully avoid some of the toxicity of Xeloda .  We will move ahead with the second week of low-dose 5-FU and low-dose oxaliplatin .  Again, I think that the combination of these 2 medicines, by itself,  can help with cancer shrinkage.  I know that when her radiation is started, she may start to run into side effects.  I talked to her about this.  She can certainly have a lot of irritation around the rectal area.  I suppose there could also be some burning.  She could certainly talk to the her Radiation Oncologist about  this.  We will have to follow her weekly.    Ivor Mars, MD 5/27/20259:12 AM

## 2024-02-05 NOTE — Patient Instructions (Signed)

## 2024-02-05 NOTE — Progress Notes (Addendum)
 Oxaliplatin  is given weekly. No LV.  Jobe Mulder [place], [place], BCPs, BCOP 02/05/2024 10:11 AM

## 2024-02-06 ENCOUNTER — Ambulatory Visit
Admission: RE | Admit: 2024-02-06 | Discharge: 2024-02-06 | Disposition: A | Discharge status: Home / Self Care | Source: Ambulatory Visit | Attending: Radiation Oncology | Admitting: Radiation Oncology

## 2024-02-06 ENCOUNTER — Other Ambulatory Visit: Payer: Self-pay

## 2024-02-06 DIAGNOSIS — C20 Malignant neoplasm of rectum: Secondary | ICD-10-CM

## 2024-02-06 DIAGNOSIS — Z510 Encounter for antineoplastic radiation therapy: Secondary | ICD-10-CM | POA: Diagnosis not present

## 2024-02-06 LAB — RAD ONC ARIA SESSION SUMMARY
Course Elapsed Days: 0
Plan Fractions Treated to Date: 1
Plan Prescribed Dose Per Fraction: 1.8 Gy
Plan Total Fractions Prescribed: 25
Plan Total Prescribed Dose: 45 Gy
Reference Point Dosage Given to Date: 1.8 Gy
Reference Point Session Dosage Given: 1.8 Gy
Session Number: 1

## 2024-02-07 ENCOUNTER — Ambulatory Visit
Admission: RE | Admit: 2024-02-07 | Discharge: 2024-02-07 | Disposition: A | Discharge status: Home / Self Care | Source: Ambulatory Visit | Attending: Radiation Oncology | Admitting: Radiation Oncology

## 2024-02-07 ENCOUNTER — Other Ambulatory Visit: Payer: Self-pay

## 2024-02-07 ENCOUNTER — Encounter: Payer: Self-pay | Admitting: Hematology & Oncology

## 2024-02-07 DIAGNOSIS — Z510 Encounter for antineoplastic radiation therapy: Secondary | ICD-10-CM | POA: Diagnosis not present

## 2024-02-07 LAB — RAD ONC ARIA SESSION SUMMARY
Course Elapsed Days: 1
Plan Fractions Treated to Date: 2
Plan Prescribed Dose Per Fraction: 1.8 Gy
Plan Total Fractions Prescribed: 25
Plan Total Prescribed Dose: 45 Gy
Reference Point Dosage Given to Date: 3.6 Gy
Reference Point Session Dosage Given: 1.8 Gy
Session Number: 2

## 2024-02-08 ENCOUNTER — Encounter

## 2024-02-08 ENCOUNTER — Other Ambulatory Visit: Payer: Self-pay

## 2024-02-08 ENCOUNTER — Ambulatory Visit
Admission: RE | Admit: 2024-02-08 | Discharge: 2024-02-08 | Disposition: A | Discharge status: Home / Self Care | Source: Ambulatory Visit | Attending: Radiation Oncology | Admitting: Radiation Oncology

## 2024-02-08 ENCOUNTER — Inpatient Hospital Stay

## 2024-02-08 VITALS — BP 160/66 | HR 81 | Temp 98.2°F | Resp 17

## 2024-02-08 DIAGNOSIS — Z510 Encounter for antineoplastic radiation therapy: Secondary | ICD-10-CM | POA: Diagnosis not present

## 2024-02-08 DIAGNOSIS — D500 Iron deficiency anemia secondary to blood loss (chronic): Secondary | ICD-10-CM

## 2024-02-08 DIAGNOSIS — C20 Malignant neoplasm of rectum: Secondary | ICD-10-CM

## 2024-02-08 LAB — RAD ONC ARIA SESSION SUMMARY
Course Elapsed Days: 2
Plan Fractions Treated to Date: 3
Plan Prescribed Dose Per Fraction: 1.8 Gy
Plan Total Fractions Prescribed: 25
Plan Total Prescribed Dose: 45 Gy
Reference Point Dosage Given to Date: 5.4 Gy
Reference Point Session Dosage Given: 1.8 Gy
Session Number: 3

## 2024-02-08 MED ORDER — SODIUM CHLORIDE 0.9% FLUSH
10.0000 mL | Freq: Once | INTRAVENOUS | Status: AC
  Administered 2024-02-08: 10 mL

## 2024-02-08 MED ORDER — HEPARIN SOD (PORK) LOCK FLUSH 100 UNIT/ML IV SOLN
500.0000 [IU] | Freq: Once | INTRAVENOUS | Status: AC
  Administered 2024-02-08: 500 [IU]

## 2024-02-11 ENCOUNTER — Other Ambulatory Visit: Payer: Self-pay

## 2024-02-11 ENCOUNTER — Inpatient Hospital Stay

## 2024-02-11 ENCOUNTER — Encounter: Payer: Self-pay | Admitting: Medical Oncology

## 2024-02-11 ENCOUNTER — Inpatient Hospital Stay (HOSPITAL_BASED_OUTPATIENT_CLINIC_OR_DEPARTMENT_OTHER): Admitting: Medical Oncology

## 2024-02-11 ENCOUNTER — Ambulatory Visit
Admission: RE | Admit: 2024-02-11 | Discharge: 2024-02-11 | Disposition: A | Discharge status: Home / Self Care | Source: Ambulatory Visit | Attending: Radiation Oncology | Admitting: Radiation Oncology

## 2024-02-11 ENCOUNTER — Inpatient Hospital Stay: Attending: Hematology & Oncology

## 2024-02-11 ENCOUNTER — Ambulatory Visit

## 2024-02-11 VITALS — BP 152/56 | HR 69 | Temp 98.0°F | Resp 20 | Wt 169.0 lb

## 2024-02-11 DIAGNOSIS — R7401 Elevation of levels of liver transaminase levels: Secondary | ICD-10-CM | POA: Insufficient documentation

## 2024-02-11 DIAGNOSIS — R002 Palpitations: Secondary | ICD-10-CM | POA: Insufficient documentation

## 2024-02-11 DIAGNOSIS — Z452 Encounter for adjustment and management of vascular access device: Secondary | ICD-10-CM | POA: Insufficient documentation

## 2024-02-11 DIAGNOSIS — F419 Anxiety disorder, unspecified: Secondary | ICD-10-CM | POA: Diagnosis present

## 2024-02-11 DIAGNOSIS — I495 Sick sinus syndrome: Secondary | ICD-10-CM | POA: Insufficient documentation

## 2024-02-11 DIAGNOSIS — D500 Iron deficiency anemia secondary to blood loss (chronic): Secondary | ICD-10-CM | POA: Insufficient documentation

## 2024-02-11 DIAGNOSIS — E440 Moderate protein-calorie malnutrition: Secondary | ICD-10-CM | POA: Insufficient documentation

## 2024-02-11 DIAGNOSIS — K5520 Angiodysplasia of colon without hemorrhage: Secondary | ICD-10-CM | POA: Insufficient documentation

## 2024-02-11 DIAGNOSIS — Z7901 Long term (current) use of anticoagulants: Secondary | ICD-10-CM | POA: Insufficient documentation

## 2024-02-11 DIAGNOSIS — K625 Hemorrhage of anus and rectum: Secondary | ICD-10-CM | POA: Insufficient documentation

## 2024-02-11 DIAGNOSIS — I639 Cerebral infarction, unspecified: Secondary | ICD-10-CM | POA: Insufficient documentation

## 2024-02-11 DIAGNOSIS — E785 Hyperlipidemia, unspecified: Secondary | ICD-10-CM | POA: Insufficient documentation

## 2024-02-11 DIAGNOSIS — E871 Hypo-osmolality and hyponatremia: Secondary | ICD-10-CM | POA: Insufficient documentation

## 2024-02-11 DIAGNOSIS — K429 Umbilical hernia without obstruction or gangrene: Secondary | ICD-10-CM | POA: Insufficient documentation

## 2024-02-11 DIAGNOSIS — K521 Toxic gastroenteritis and colitis: Secondary | ICD-10-CM | POA: Insufficient documentation

## 2024-02-11 DIAGNOSIS — K219 Gastro-esophageal reflux disease without esophagitis: Secondary | ICD-10-CM | POA: Insufficient documentation

## 2024-02-11 DIAGNOSIS — Z1509 Genetic susceptibility to other malignant neoplasm: Secondary | ICD-10-CM | POA: Insufficient documentation

## 2024-02-11 DIAGNOSIS — D649 Anemia, unspecified: Secondary | ICD-10-CM | POA: Insufficient documentation

## 2024-02-11 DIAGNOSIS — R062 Wheezing: Secondary | ICD-10-CM | POA: Insufficient documentation

## 2024-02-11 DIAGNOSIS — B351 Tinea unguium: Secondary | ICD-10-CM | POA: Insufficient documentation

## 2024-02-11 DIAGNOSIS — Z95828 Presence of other vascular implants and grafts: Secondary | ICD-10-CM | POA: Diagnosis present

## 2024-02-11 DIAGNOSIS — C20 Malignant neoplasm of rectum: Secondary | ICD-10-CM | POA: Insufficient documentation

## 2024-02-11 DIAGNOSIS — J9601 Acute respiratory failure with hypoxia: Secondary | ICD-10-CM | POA: Insufficient documentation

## 2024-02-11 DIAGNOSIS — R6889 Other general symptoms and signs: Secondary | ICD-10-CM | POA: Insufficient documentation

## 2024-02-11 DIAGNOSIS — Z5111 Encounter for antineoplastic chemotherapy: Secondary | ICD-10-CM | POA: Insufficient documentation

## 2024-02-11 DIAGNOSIS — I350 Nonrheumatic aortic (valve) stenosis: Secondary | ICD-10-CM | POA: Insufficient documentation

## 2024-02-11 DIAGNOSIS — K31819 Angiodysplasia of stomach and duodenum without bleeding: Secondary | ICD-10-CM | POA: Insufficient documentation

## 2024-02-11 DIAGNOSIS — E119 Type 2 diabetes mellitus without complications: Secondary | ICD-10-CM | POA: Insufficient documentation

## 2024-02-11 DIAGNOSIS — K5900 Constipation, unspecified: Secondary | ICD-10-CM | POA: Insufficient documentation

## 2024-02-11 DIAGNOSIS — E860 Dehydration: Secondary | ICD-10-CM | POA: Insufficient documentation

## 2024-02-11 DIAGNOSIS — R140 Abdominal distension (gaseous): Secondary | ICD-10-CM | POA: Insufficient documentation

## 2024-02-11 DIAGNOSIS — D696 Thrombocytopenia, unspecified: Secondary | ICD-10-CM | POA: Insufficient documentation

## 2024-02-11 DIAGNOSIS — K922 Gastrointestinal hemorrhage, unspecified: Secondary | ICD-10-CM | POA: Insufficient documentation

## 2024-02-11 DIAGNOSIS — N3000 Acute cystitis without hematuria: Secondary | ICD-10-CM | POA: Insufficient documentation

## 2024-02-11 DIAGNOSIS — J189 Pneumonia, unspecified organism: Secondary | ICD-10-CM | POA: Diagnosis present

## 2024-02-11 DIAGNOSIS — R5383 Other fatigue: Secondary | ICD-10-CM | POA: Insufficient documentation

## 2024-02-11 DIAGNOSIS — R195 Other fecal abnormalities: Secondary | ICD-10-CM | POA: Diagnosis present

## 2024-02-11 DIAGNOSIS — M79675 Pain in left toe(s): Secondary | ICD-10-CM | POA: Diagnosis present

## 2024-02-11 DIAGNOSIS — I2510 Atherosclerotic heart disease of native coronary artery without angina pectoris: Secondary | ICD-10-CM | POA: Insufficient documentation

## 2024-02-11 DIAGNOSIS — I480 Paroxysmal atrial fibrillation: Secondary | ICD-10-CM | POA: Insufficient documentation

## 2024-02-11 DIAGNOSIS — E039 Hypothyroidism, unspecified: Secondary | ICD-10-CM | POA: Diagnosis present

## 2024-02-11 DIAGNOSIS — R197 Diarrhea, unspecified: Secondary | ICD-10-CM | POA: Diagnosis present

## 2024-02-11 DIAGNOSIS — Z860100 Personal history of colon polyps, unspecified: Secondary | ICD-10-CM | POA: Insufficient documentation

## 2024-02-11 DIAGNOSIS — E876 Hypokalemia: Secondary | ICD-10-CM | POA: Insufficient documentation

## 2024-02-11 DIAGNOSIS — R079 Chest pain, unspecified: Secondary | ICD-10-CM | POA: Insufficient documentation

## 2024-02-11 DIAGNOSIS — M79674 Pain in right toe(s): Secondary | ICD-10-CM | POA: Insufficient documentation

## 2024-02-11 DIAGNOSIS — I10 Essential (primary) hypertension: Secondary | ICD-10-CM | POA: Diagnosis present

## 2024-02-11 DIAGNOSIS — R911 Solitary pulmonary nodule: Secondary | ICD-10-CM | POA: Diagnosis present

## 2024-02-11 DIAGNOSIS — D509 Iron deficiency anemia, unspecified: Secondary | ICD-10-CM | POA: Diagnosis present

## 2024-02-11 DIAGNOSIS — K621 Rectal polyp: Secondary | ICD-10-CM | POA: Diagnosis present

## 2024-02-11 DIAGNOSIS — T451X5A Adverse effect of antineoplastic and immunosuppressive drugs, initial encounter: Secondary | ICD-10-CM | POA: Diagnosis present

## 2024-02-11 DIAGNOSIS — Z510 Encounter for antineoplastic radiation therapy: Secondary | ICD-10-CM | POA: Insufficient documentation

## 2024-02-11 DIAGNOSIS — K859 Acute pancreatitis without necrosis or infection, unspecified: Secondary | ICD-10-CM | POA: Diagnosis present

## 2024-02-11 LAB — CMP (CANCER CENTER ONLY)
ALT: 18 U/L (ref 0–44)
AST: 16 U/L (ref 15–41)
Albumin: 4.4 g/dL (ref 3.5–5.0)
Alkaline Phosphatase: 86 U/L (ref 38–126)
Anion gap: 7 (ref 5–15)
BUN: 13 mg/dL (ref 8–23)
CO2: 30 mmol/L (ref 22–32)
Calcium: 9 mg/dL (ref 8.9–10.3)
Chloride: 93 mmol/L — ABNORMAL LOW (ref 98–111)
Creatinine: 0.8 mg/dL (ref 0.44–1.00)
GFR, Estimated: >60 mL/min (ref >60)
Glucose, Bld: 206 mg/dL — ABNORMAL HIGH (ref 70–99)
Potassium: 3.7 mmol/L (ref 3.5–5.1)
Sodium: 130 mmol/L — ABNORMAL LOW (ref 135–145)
Total Bilirubin: 0.6 mg/dL (ref 0.0–1.2)
Total Protein: 6.8 g/dL (ref 6.5–8.1)

## 2024-02-11 LAB — CBC WITH DIFFERENTIAL (CANCER CENTER ONLY)
Abs Immature Granulocytes: 0.04 10*3/uL (ref 0.00–0.07)
Basophils Absolute: 0 10*3/uL (ref 0.0–0.1)
Basophils Relative: 0 %
Eosinophils Absolute: 0.1 10*3/uL (ref 0.0–0.5)
Eosinophils Relative: 2 %
HCT: 38.3 % (ref 36.0–46.0)
Hemoglobin: 12.9 g/dL (ref 12.0–15.0)
Immature Granulocytes: 1 %
Lymphocytes Relative: 23 %
Lymphs Abs: 1.8 10*3/uL (ref 0.7–4.0)
MCH: 30.2 pg (ref 26.0–34.0)
MCHC: 33.7 g/dL (ref 30.0–36.0)
MCV: 89.7 fL (ref 80.0–100.0)
Monocytes Absolute: 0.6 10*3/uL (ref 0.1–1.0)
Monocytes Relative: 8 %
Neutro Abs: 5.4 10*3/uL (ref 1.7–7.7)
Neutrophils Relative %: 66 %
Platelet Count: 233 10*3/uL (ref 150–400)
RBC: 4.27 MIL/uL (ref 3.87–5.11)
RDW: 14.9 % (ref 11.5–15.5)
WBC Count: 8 10*3/uL (ref 4.0–10.5)
nRBC: 0 % (ref 0.0–0.2)

## 2024-02-11 LAB — RAD ONC ARIA SESSION SUMMARY
Course Elapsed Days: 5
Plan Fractions Treated to Date: 4
Plan Prescribed Dose Per Fraction: 1.8 Gy
Plan Total Fractions Prescribed: 25
Plan Total Prescribed Dose: 45 Gy
Reference Point Dosage Given to Date: 7.2 Gy
Reference Point Session Dosage Given: 1.8 Gy
Session Number: 4

## 2024-02-11 MED ORDER — HEPARIN SOD (PORK) LOCK FLUSH 100 UNIT/ML IV SOLN
500.0000 [IU] | Freq: Once | INTRAVENOUS | Status: DC | PRN
Start: 2024-02-11 — End: 2024-02-11

## 2024-02-11 MED ORDER — PALONOSETRON HCL INJECTION 0.25 MG/5ML
0.2500 mg | Freq: Once | INTRAVENOUS | Status: AC
  Administered 2024-02-11: 0.25 mg via INTRAVENOUS
  Filled 2024-02-11: qty 5

## 2024-02-11 MED ORDER — OXALIPLATIN CHEMO INJECTION 100 MG/20ML
40.0000 mg/m2 | Freq: Once | INTRAVENOUS | Status: AC
  Administered 2024-02-11: 70 mg via INTRAVENOUS
  Filled 2024-02-11 (×3): qty 14

## 2024-02-11 MED ORDER — DEXTROSE 5 % IV SOLN: INTRAVENOUS | Status: DC

## 2024-02-11 MED ORDER — SODIUM CHLORIDE 0.9 % IV SOLN
250.0000 mg/m2/d | INTRAVENOUS | Status: DC
  Administered 2024-02-11: 2000 mg via INTRAVENOUS
  Filled 2024-02-11 (×2): qty 40

## 2024-02-11 MED ORDER — DEXAMETHASONE SODIUM PHOSPHATE 10 MG/ML IJ SOLN
5.0000 mg | Freq: Once | INTRAMUSCULAR | Status: AC
  Administered 2024-02-11: 5 mg via INTRAVENOUS
  Filled 2024-02-11: qty 1

## 2024-02-11 MED ORDER — SODIUM CHLORIDE 0.9% FLUSH: 10.0000 mL | INTRAVENOUS | Status: DC | PRN

## 2024-02-11 NOTE — Patient Instructions (Signed)

## 2024-02-11 NOTE — Progress Notes (Signed)
 Hematology and Oncology Follow Up Visit  [name] [id number] 1928-10-15 89 y.o. 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 3 weeks-s/p cycle #1  -- start on 02/09/2023 -- d/c on 04/12/2023 Xeloda  1500 mg po BID (14/7) -- s/p cycle #6 on 02/13/2023 -- changed to 1000mg  po BID on 08/31/2023 --on hold since 08/2023  5-FU/Oxaliplatin  +XRT -- s/p cycle #1 -start on 01/28/2024     Interim History:  Ms. Hartsough is back for follow-up. She is here with her daughter. She is on low-dose chemotherapy which she is tolerating well. She is also on XRT which is going well except for some mild skin irritation of the area being targeted.   Very limited constipation for which she takes MiraLAX .  She has had no problems with pain.    Rectal bleeding has stopped for the past week- she is excited about this.   She has had no fever.  There has been no rashes.  She has had no leg swelling.  She has had no joint issues.  She has had no neuropathy.  Overall, I would have to say that her performance status is probably ECOG 1.   Wt Readings from Last 3 Encounters:  02/11/24 169 lb (76.7 kg)  02/05/24 170 lb (77.1 kg)  01/24/24 169 lb 4 oz (76.8 kg)     Medications:  Current Outpatient Medications:    acetaminophen  (TYLENOL ) 325 MG tablet, Take 2 tablets (650 mg total) by mouth every 6 (six) hours as needed for mild pain (or Fever >/= 101)., Disp: 30 tablet, Rfl: 0   ALPRAZolam  (XANAX ) 0.5 MG tablet, Take 0.5 mg by mouth 3 (three) times daily as needed for anxiety. For anxiety, Disp: , Rfl:    amiodarone  (PACERONE ) 200 MG tablet, Take 0.5 tablets (100 mg total) by mouth daily., Disp: 45 tablet, Rfl: 2   amLODipine  (NORVASC ) 10 MG tablet, Take 10 mg by mouth daily., Disp: , Rfl:    Blood Glucose Monitoring Suppl (TRUE METRIX AIR GLUCOSE METER) w/Device KIT, Check blood sugar q  daily Dx-E11.9, Disp: , Rfl:    capecitabine  (XELODA ) 500 MG tablet, Take 500 mg by mouth 2 (two) times daily after a meal. Take 3 tablets orally twice a day after a meal. 14 days on, 7 days off., Disp: , Rfl:    cholecalciferol (VITAMIN D3) 25 MCG (1000 UNIT) tablet, Take 1,000 Units by mouth daily., Disp: , Rfl:    clotrimazole  (LOTRIMIN ) 1 % cream, Apply 1 Application topically 2 (two) times daily., Disp: 30 g, Rfl: 0   cyanocobalamin  1000 MCG tablet, Take 1 tablet (1,000 mcg total) by mouth daily., Disp: 30 tablet, Rfl: 0   escitalopram  (LEXAPRO ) 5 MG tablet, Take 5 mg by mouth at bedtime., Disp: , Rfl:    famotidine (PEPCID) 20 MG tablet, Take 20 mg by mouth daily., Disp: , Rfl:    feeding supplement (ENSURE ENLIVE / ENSURE PLUS) LIQD, Take 237 mLs by mouth 3 (three) times daily with meals. (Patient taking differently: Take 237 mLs by mouth 3 (three) times daily as needed (nutrition).), Disp: 237 mL, Rfl: 12   fluticasone  (FLONASE ) 50 MCG/ACT nasal spray, Place 2 sprays into both nostrils daily., Disp: 11.1 mL, Rfl: 0   hydrALAZINE  (APRESOLINE ) 25 MG tablet, Take 1 tablet (25 mg total) by mouth 2 (two) times daily., Disp: 180 tablet, Rfl: 3   levothyroxine  (SYNTHROID ) 175 MCG  tablet, Take 175 mcg by mouth every morning., Disp: , Rfl:    lidocaine -prilocaine  (EMLA ) cream, Apply 1 Application topically as needed., Disp: 30 g, Rfl: 4   lidocaine -prilocaine  (EMLA ) cream, Apply to affected area once, Disp: 30 g, Rfl: 3   loratadine  (CLARITIN ) 10 MG tablet, Take 1 tablet (10 mg total) by mouth daily., Disp: 30 tablet, Rfl: 0   ondansetron  (ZOFRAN ) 8 MG tablet, Take 1 tablet (8 mg total) by mouth every 8 (eight) hours as needed for nausea or vomiting., Disp: 30 tablet, Rfl: 1   polyethylene glycol (MIRALAX  / GLYCOLAX ) 17 g packet, Take 17 g by mouth daily as needed for mild constipation., Disp: 14 each, Rfl: 0   potassium chloride  SA (K-DUR,KLOR-CON ) 20 MEQ tablet, Take 20 mEq by mouth daily., Disp: ,  Rfl:    prochlorperazine  (COMPAZINE ) 10 MG tablet, Take 1 tablet (10 mg total) by mouth every 6 (six) hours as needed., Disp: 30 tablet, Rfl: 1   prochlorperazine  (COMPAZINE ) 10 MG tablet, Take 1 tablet (10 mg total) by mouth every 6 (six) hours as needed for nausea or vomiting., Disp: 30 tablet, Rfl: 1   triamterene -hydrochlorothiazide  (MAXZIDE-25) 37.5-25 MG tablet, Take 1 tablet by mouth daily., Disp: 90 tablet, Rfl: 1   TRUE METRIX BLOOD GLUCOSE TEST test strip, , Disp: , Rfl:  No current facility-administered medications for this visit.  Facility-Administered Medications Ordered in Other Visits:    dextrose  5 % solution, , Intravenous, Continuous, Ennever, Sherryll Donald, MD, Last Rate: 10 mL/hr at 02/11/24 1220, New Bag at 02/11/24 1220   fluorouracil  (ADRUCIL ) 2,000 mg in sodium chloride  0.9 % 110 mL chemo infusion, 250 mg/m2/day (Treatment Plan Recorded), Intravenous, 4 days, Ennever, Sherryll Donald, MD   heparin  lock flush 100 unit/mL, 500 Units, Intracatheter, Once PRN, Ennever, Peter R, MD   oxaliplatin  (ELOXATIN ) 70 mg in dextrose  5 % 500 mL chemo infusion, 40 mg/m2 (Treatment Plan Recorded), Intravenous, Once, Ivor Mars, MD, Last Rate: 257 mL/hr at 02/11/24 1325, 70 mg at 02/11/24 1325   sodium chloride  flush (NS) 0.9 % injection 10 mL, 10 mL, Intracatheter, PRN, Ennever, Peter R, MD  Allergies:  Allergies  Allergen Reactions   Aggrenox  [Aspirin -Dipyridamole  Er] Other (See Comments)    Severe Bleeding and Stomach Pain   Prednisone  Other (See Comments)    Climbs the walls    Past Medical History, Surgical history, Social history, and Family History were reviewed and updated.  Review of Systems: Review of Systems  Constitutional:  Positive for fatigue.  HENT:  Negative.    Eyes: Negative.   Respiratory: Negative.    Cardiovascular:  Positive for palpitations.  Gastrointestinal:  Positive for rectal pain. Negative for blood in stool.  Endocrine: Negative.   Genitourinary:  Negative.    Musculoskeletal: Negative.   Skin: Negative.   Neurological:  Positive for light-headedness.  Hematological: Negative.   Psychiatric/Behavioral: Negative.      Physical Exam: Vitals:   02/11/24 1200  BP: (!) 152/56  Pulse: 69  Resp: 20  Temp: 98 F (36.7 C)     Wt Readings from Last 3 Encounters:  02/11/24 169 lb (76.7 kg)  02/05/24 170 lb (77.1 kg)  01/24/24 169 lb 4 oz (76.8 kg)    Physical Exam Vitals reviewed.  HENT:     Head: Normocephalic and atraumatic.  Eyes:     Pupils: Pupils are equal, round, and reactive to light.  Cardiovascular:     Rate and Rhythm: Normal rate and regular rhythm.  Heart sounds: Normal heart sounds.     Comments: Cardiac exam is regular rate and rhythm.  She has no murmurs, rubs or bruits. Pulmonary:     Effort: Pulmonary effort is normal.     Breath sounds: Normal breath sounds.     Comments: She has good air movement bilaterally Abdominal:     General: Bowel sounds are normal.     Palpations: Abdomen is soft.  Musculoskeletal:        General: No tenderness or deformity. Normal range of motion.     Cervical back: Normal range of motion.  Lymphadenopathy:     Cervical: No cervical adenopathy.  Skin:    General: Skin is warm and dry.     Findings: No erythema or rash.  Neurological:     Mental Status: She is alert and oriented to person, place, and time.  Psychiatric:        Behavior: Behavior normal.        Thought Content: Thought content normal.        Judgment: Judgment normal.     Lab Results  Component Value Date   WBC 8.0 02/11/2024   HGB 12.9 02/11/2024   HCT 38.3 02/11/2024   MCV 89.7 02/11/2024   PLT 233 02/11/2024     Chemistry      Component Value Date/Time   NA 130 (L) 02/11/2024 1102   K 3.7 02/11/2024 1102   CL 93 (L) 02/11/2024 1102   CO2 30 02/11/2024 1102   BUN 13 02/11/2024 1102   CREATININE 0.80 02/11/2024 1102      Component Value Date/Time   CALCIUM  9.0 02/11/2024 1102    ALKPHOS 86 02/11/2024 1102   AST 16 02/11/2024 1102   ALT 18 02/11/2024 1102   BILITOT 0.6 02/11/2024 1102     Encounter Diagnoses  Name Primary?   Rectal cancer (HCC) Yes   Port-A-Cath in place     Impression and Plan: Ms. Brester is a very charming 89 year old white female.  She has at least a locally advanced adenocarcinoma of the rectum with local recurrence.   Had a really hard time with Xeloda  in the past. She is now on an 5-FU pump.  We will move ahead with the third  week of low-dose 5-FU and low-dose oxaliplatin . I have made some recommendations regarding her chaffing from the XRT- she will ask if GLIDE is ok to apply after XRT treatment or she can wear bike shorts to avoid skin on skin contact. She will continue discussing this with Radiation Oncology.  Reviewed her CBC and CMP with her. She will try to increase her sodium slightly. Reviewed red flags.   Treatment today RTC 1 week MD, port labs(CBC w/, CMP), treatment     Sharla Davis, PA-C 6/2/20251:35 PM

## 2024-02-11 NOTE — Patient Instructions (Signed)
 [facility] - A DEPT OF [place]  Discharge Instructions: Thank you for choosing [facility] to provide your oncology and hematology care.   If you have a lab appointment with the Cancer Center, please go directly to the Cancer Center and check in at the registration area.  Wear comfortable clothing and clothing appropriate for easy access to any Portacath or PICC line.   We strive to give you quality time with your provider. You may need to reschedule your appointment if you arrive late (15 or more minutes).  Arriving late affects you and other patients whose appointments are after yours.  Also, if you miss three or more appointments without notifying the office, you may be dismissed from the clinic at the provider's discretion.      For prescription refill requests, have your pharmacy contact our office and allow 72 hours for refills to be completed.    Today you received the following chemotherapy and/or immunotherapy agents oxaliplatin , 5-FU     To help prevent nausea and vomiting after your treatment, we encourage you to take your nausea medication as directed.  BELOW ARE SYMPTOMS THAT SHOULD BE REPORTED IMMEDIATELY: *FEVER GREATER THAN 100.4 F (38 C) OR HIGHER *CHILLS OR SWEATING *NAUSEA AND VOMITING THAT IS NOT CONTROLLED WITH YOUR NAUSEA MEDICATION *UNUSUAL SHORTNESS OF BREATH *UNUSUAL BRUISING OR BLEEDING *URINARY PROBLEMS (pain or burning when urinating, or frequent urination) *BOWEL PROBLEMS (unusual diarrhea, constipation, pain near the anus) TENDERNESS IN MOUTH AND THROAT WITH OR WITHOUT PRESENCE OF ULCERS (sore throat, sores in mouth, or a toothache) UNUSUAL RASH, SWELLING OR PAIN  UNUSUAL VAGINAL DISCHARGE OR ITCHING   Items with * indicate a potential emergency and should be followed up as soon as possible or go to the Emergency Department if any problems should occur.  Please show the CHEMOTHERAPY ALERT CARD or  IMMUNOTHERAPY ALERT CARD at check-in to the Emergency Department and triage nurse. Should you have questions after your visit or need to cancel or reschedule your appointment, please contact [facility] - A DEPT OF [facility]  [phone] and follow the prompts.  Office hours are 8:00 a.m. to 4:30 p.m. Monday - Friday. Please note that voicemails left after 4:00 p.m. may not be returned until the following business day.  We are closed weekends and major holidays. You have access to a nurse at all times for urgent questions. Please call the main number to the clinic [phone] and follow the prompts.  For any non-urgent questions, you may also contact your provider using MyChart. We now offer e-Visits for anyone 51 and older to request care online for non-urgent symptoms. For details visit [email or web address].   Also download the MyChart app! Go to the app store, search "MyChart", open the app, select [place], and log in with your MyChart username and password.

## 2024-02-12 ENCOUNTER — Other Ambulatory Visit: Payer: Self-pay

## 2024-02-12 ENCOUNTER — Ambulatory Visit

## 2024-02-12 ENCOUNTER — Ambulatory Visit
Admission: RE | Admit: 2024-02-12 | Discharge: 2024-02-12 | Disposition: A | Discharge status: Home / Self Care | Source: Ambulatory Visit | Attending: Radiation Oncology | Admitting: Radiation Oncology

## 2024-02-12 DIAGNOSIS — C20 Malignant neoplasm of rectum: Secondary | ICD-10-CM | POA: Diagnosis not present

## 2024-02-12 LAB — RAD ONC ARIA SESSION SUMMARY
Course Elapsed Days: 6
Plan Fractions Treated to Date: 5
Plan Prescribed Dose Per Fraction: 1.8 Gy
Plan Total Fractions Prescribed: 25
Plan Total Prescribed Dose: 45 Gy
Reference Point Dosage Given to Date: 9 Gy
Reference Point Session Dosage Given: 1.8 Gy
Session Number: 5

## 2024-02-13 ENCOUNTER — Ambulatory Visit
Admission: RE | Admit: 2024-02-13 | Discharge: 2024-02-13 | Disposition: A | Discharge status: Home / Self Care | Source: Ambulatory Visit | Attending: Radiation Oncology | Admitting: Radiation Oncology

## 2024-02-13 ENCOUNTER — Other Ambulatory Visit: Payer: Self-pay

## 2024-02-13 DIAGNOSIS — C20 Malignant neoplasm of rectum: Secondary | ICD-10-CM | POA: Diagnosis not present

## 2024-02-13 LAB — RAD ONC ARIA SESSION SUMMARY
Course Elapsed Days: 7
Plan Fractions Treated to Date: 6
Plan Prescribed Dose Per Fraction: 1.8 Gy
Plan Total Fractions Prescribed: 25
Plan Total Prescribed Dose: 45 Gy
Reference Point Dosage Given to Date: 10.8 Gy
Reference Point Session Dosage Given: 1.8 Gy
Session Number: 6

## 2024-02-14 ENCOUNTER — Ambulatory Visit
Admission: RE | Admit: 2024-02-14 | Discharge: 2024-02-14 | Disposition: A | Discharge status: Home / Self Care | Source: Ambulatory Visit | Attending: Radiation Oncology | Admitting: Radiation Oncology

## 2024-02-14 ENCOUNTER — Other Ambulatory Visit: Payer: Self-pay

## 2024-02-14 ENCOUNTER — Ambulatory Visit
Admission: RE | Admit: 2024-02-14 | Discharge: 2024-02-14 | Disposition: A | Discharge status: Home / Self Care | Source: Ambulatory Visit | Attending: Radiology | Admitting: Radiology

## 2024-02-14 VITALS — BP 176/71 | HR 93 | Resp 18

## 2024-02-14 DIAGNOSIS — C20 Malignant neoplasm of rectum: Secondary | ICD-10-CM

## 2024-02-14 DIAGNOSIS — B3749 Other urogenital candidiasis: Secondary | ICD-10-CM | POA: Insufficient documentation

## 2024-02-14 DIAGNOSIS — K625 Hemorrhage of anus and rectum: Secondary | ICD-10-CM

## 2024-02-14 LAB — RAD ONC ARIA SESSION SUMMARY
Course Elapsed Days: 8
Plan Fractions Treated to Date: 7
Plan Prescribed Dose Per Fraction: 1.8 Gy
Plan Total Fractions Prescribed: 25
Plan Total Prescribed Dose: 45 Gy
Reference Point Dosage Given to Date: 12.6 Gy
Reference Point Session Dosage Given: 1.8 Gy
Session Number: 7

## 2024-02-14 MED ORDER — RADIAPLEXRX EX GEL: Freq: Once | CUTANEOUS | Status: AC

## 2024-02-14 MED ORDER — NYSTATIN-TRIAMCINOLONE 100000-0.1 UNIT/GM-% EX OINT
1.0000 | TOPICAL_OINTMENT | Freq: Two times a day (BID) | CUTANEOUS | 0 refills | Status: AC
Start: 2024-02-14 — End: ?

## 2024-02-14 NOTE — Progress Notes (Signed)
 Patient was seen prior to her 7th treatment with a multiple day history of inguinal pain and itchiness. Patient denies abdominal pain, rectal bleeding, constipation, but does report some diarrhea. On PE the inguinal folds were erythematous with white cottage-cheese like discharge, consistent with a yeast infection. Prescription for Nystatin  cream sent to patient's preferred pharmacy today. Dr. Eloise Hake will see the patient for PUT on 02/19/2024.     Julio Ohm, PA-C

## 2024-02-15 ENCOUNTER — Other Ambulatory Visit: Payer: Self-pay | Admitting: *Deleted

## 2024-02-15 ENCOUNTER — Other Ambulatory Visit: Payer: Self-pay

## 2024-02-15 ENCOUNTER — Encounter

## 2024-02-15 ENCOUNTER — Ambulatory Visit
Admission: RE | Admit: 2024-02-15 | Discharge: 2024-02-15 | Disposition: A | Discharge status: Home / Self Care | Source: Ambulatory Visit | Attending: Radiation Oncology | Admitting: Radiation Oncology

## 2024-02-15 ENCOUNTER — Inpatient Hospital Stay

## 2024-02-15 VITALS — BP 145/62 | HR 93 | Temp 98.1°F | Resp 17

## 2024-02-15 DIAGNOSIS — C20 Malignant neoplasm of rectum: Secondary | ICD-10-CM | POA: Diagnosis not present

## 2024-02-15 DIAGNOSIS — Z1509 Genetic susceptibility to other malignant neoplasm: Secondary | ICD-10-CM

## 2024-02-15 DIAGNOSIS — Z95828 Presence of other vascular implants and grafts: Secondary | ICD-10-CM

## 2024-02-15 DIAGNOSIS — D509 Iron deficiency anemia, unspecified: Secondary | ICD-10-CM

## 2024-02-15 DIAGNOSIS — K922 Gastrointestinal hemorrhage, unspecified: Secondary | ICD-10-CM

## 2024-02-15 DIAGNOSIS — D500 Iron deficiency anemia secondary to blood loss (chronic): Secondary | ICD-10-CM

## 2024-02-15 DIAGNOSIS — K625 Hemorrhage of anus and rectum: Secondary | ICD-10-CM

## 2024-02-15 LAB — RAD ONC ARIA SESSION SUMMARY
Course Elapsed Days: 9
Plan Fractions Treated to Date: 8
Plan Prescribed Dose Per Fraction: 1.8 Gy
Plan Total Fractions Prescribed: 25
Plan Total Prescribed Dose: 45 Gy
Reference Point Dosage Given to Date: 14.4 Gy
Reference Point Session Dosage Given: 1.8 Gy
Session Number: 8

## 2024-02-15 MED ORDER — SODIUM CHLORIDE 0.9% FLUSH
10.0000 mL | INTRAVENOUS | Status: DC | PRN
  Administered 2024-02-15: 10 mL

## 2024-02-15 MED ORDER — HEPARIN SOD (PORK) LOCK FLUSH 100 UNIT/ML IV SOLN
500.0000 [IU] | Freq: Once | INTRAVENOUS | Status: AC | PRN
  Administered 2024-02-15: 500 [IU]

## 2024-02-17 ENCOUNTER — Other Ambulatory Visit: Payer: Self-pay | Admitting: Physician Assistant

## 2024-02-18 ENCOUNTER — Ambulatory Visit
Admission: RE | Admit: 2024-02-18 | Discharge: 2024-02-18 | Disposition: A | Discharge status: Home / Self Care | Source: Ambulatory Visit | Attending: Radiation Oncology | Admitting: Radiation Oncology

## 2024-02-18 ENCOUNTER — Inpatient Hospital Stay

## 2024-02-18 ENCOUNTER — Encounter: Payer: Self-pay | Admitting: Hematology & Oncology

## 2024-02-18 ENCOUNTER — Inpatient Hospital Stay (HOSPITAL_BASED_OUTPATIENT_CLINIC_OR_DEPARTMENT_OTHER): Admitting: Hematology & Oncology

## 2024-02-18 ENCOUNTER — Other Ambulatory Visit: Payer: Self-pay | Admitting: *Deleted

## 2024-02-18 ENCOUNTER — Ambulatory Visit

## 2024-02-18 ENCOUNTER — Other Ambulatory Visit: Payer: Self-pay

## 2024-02-18 VITALS — BP 130/49 | HR 71 | Temp 97.9°F | Resp 20 | Ht 60.0 in | Wt 168.0 lb

## 2024-02-18 DIAGNOSIS — R6889 Other general symptoms and signs: Secondary | ICD-10-CM

## 2024-02-18 DIAGNOSIS — R079 Chest pain, unspecified: Secondary | ICD-10-CM

## 2024-02-18 DIAGNOSIS — K625 Hemorrhage of anus and rectum: Secondary | ICD-10-CM

## 2024-02-18 DIAGNOSIS — C20 Malignant neoplasm of rectum: Secondary | ICD-10-CM

## 2024-02-18 DIAGNOSIS — D500 Iron deficiency anemia secondary to blood loss (chronic): Secondary | ICD-10-CM

## 2024-02-18 DIAGNOSIS — K922 Gastrointestinal hemorrhage, unspecified: Secondary | ICD-10-CM

## 2024-02-18 DIAGNOSIS — K31819 Angiodysplasia of stomach and duodenum without bleeding: Secondary | ICD-10-CM

## 2024-02-18 DIAGNOSIS — D649 Anemia, unspecified: Secondary | ICD-10-CM

## 2024-02-18 DIAGNOSIS — K5520 Angiodysplasia of colon without hemorrhage: Secondary | ICD-10-CM

## 2024-02-18 DIAGNOSIS — R7401 Elevation of levels of liver transaminase levels: Secondary | ICD-10-CM

## 2024-02-18 DIAGNOSIS — I2510 Atherosclerotic heart disease of native coronary artery without angina pectoris: Secondary | ICD-10-CM

## 2024-02-18 DIAGNOSIS — E785 Hyperlipidemia, unspecified: Secondary | ICD-10-CM

## 2024-02-18 DIAGNOSIS — E119 Type 2 diabetes mellitus without complications: Secondary | ICD-10-CM

## 2024-02-18 DIAGNOSIS — D509 Iron deficiency anemia, unspecified: Secondary | ICD-10-CM

## 2024-02-18 DIAGNOSIS — J9601 Acute respiratory failure with hypoxia: Secondary | ICD-10-CM

## 2024-02-18 DIAGNOSIS — Z7901 Long term (current) use of anticoagulants: Secondary | ICD-10-CM

## 2024-02-18 DIAGNOSIS — I480 Paroxysmal atrial fibrillation: Secondary | ICD-10-CM

## 2024-02-18 DIAGNOSIS — E871 Hypo-osmolality and hyponatremia: Secondary | ICD-10-CM

## 2024-02-18 DIAGNOSIS — E039 Hypothyroidism, unspecified: Secondary | ICD-10-CM

## 2024-02-18 DIAGNOSIS — E440 Moderate protein-calorie malnutrition: Secondary | ICD-10-CM

## 2024-02-18 DIAGNOSIS — B351 Tinea unguium: Secondary | ICD-10-CM

## 2024-02-18 DIAGNOSIS — K621 Rectal polyp: Secondary | ICD-10-CM

## 2024-02-18 DIAGNOSIS — J189 Pneumonia, unspecified organism: Secondary | ICD-10-CM

## 2024-02-18 DIAGNOSIS — I10 Essential (primary) hypertension: Secondary | ICD-10-CM

## 2024-02-18 DIAGNOSIS — I639 Cerebral infarction, unspecified: Secondary | ICD-10-CM

## 2024-02-18 DIAGNOSIS — R911 Solitary pulmonary nodule: Secondary | ICD-10-CM

## 2024-02-18 DIAGNOSIS — I495 Sick sinus syndrome: Secondary | ICD-10-CM

## 2024-02-18 DIAGNOSIS — Z1509 Genetic susceptibility to other malignant neoplasm: Secondary | ICD-10-CM

## 2024-02-18 DIAGNOSIS — E876 Hypokalemia: Secondary | ICD-10-CM

## 2024-02-18 DIAGNOSIS — K859 Acute pancreatitis without necrosis or infection, unspecified: Secondary | ICD-10-CM

## 2024-02-18 DIAGNOSIS — I350 Nonrheumatic aortic (valve) stenosis: Secondary | ICD-10-CM

## 2024-02-18 DIAGNOSIS — K429 Umbilical hernia without obstruction or gangrene: Secondary | ICD-10-CM

## 2024-02-18 DIAGNOSIS — F419 Anxiety disorder, unspecified: Secondary | ICD-10-CM

## 2024-02-18 DIAGNOSIS — R195 Other fecal abnormalities: Secondary | ICD-10-CM

## 2024-02-18 DIAGNOSIS — Z95828 Presence of other vascular implants and grafts: Secondary | ICD-10-CM

## 2024-02-18 DIAGNOSIS — Z860100 Personal history of colon polyps, unspecified: Secondary | ICD-10-CM

## 2024-02-18 DIAGNOSIS — N3000 Acute cystitis without hematuria: Secondary | ICD-10-CM

## 2024-02-18 DIAGNOSIS — R062 Wheezing: Secondary | ICD-10-CM

## 2024-02-18 LAB — CMP (CANCER CENTER ONLY)
ALT: 22 U/L (ref 0–44)
AST: 23 U/L (ref 15–41)
Albumin: 4.1 g/dL (ref 3.5–5.0)
Alkaline Phosphatase: 65 U/L (ref 38–126)
Anion gap: 8 (ref 5–15)
BUN: 13 mg/dL (ref 8–23)
CO2: 30 mmol/L (ref 22–32)
Calcium: 8.8 mg/dL — ABNORMAL LOW (ref 8.9–10.3)
Chloride: 93 mmol/L — ABNORMAL LOW (ref 98–111)
Creatinine: 0.92 mg/dL (ref 0.44–1.00)
GFR, Estimated: 58 mL/min — ABNORMAL LOW (ref >60)
Glucose, Bld: 148 mg/dL — ABNORMAL HIGH (ref 70–99)
Potassium: 3.1 mmol/L — ABNORMAL LOW (ref 3.5–5.1)
Sodium: 131 mmol/L — ABNORMAL LOW (ref 135–145)
Total Bilirubin: 0.8 mg/dL (ref 0.0–1.2)
Total Protein: 6.6 g/dL (ref 6.5–8.1)

## 2024-02-18 LAB — RAD ONC ARIA SESSION SUMMARY
Course Elapsed Days: 12
Plan Fractions Treated to Date: 9
Plan Prescribed Dose Per Fraction: 1.8 Gy
Plan Total Fractions Prescribed: 25
Plan Total Prescribed Dose: 45 Gy
Reference Point Dosage Given to Date: 16.2 Gy
Reference Point Session Dosage Given: 1.8 Gy
Session Number: 9

## 2024-02-18 LAB — CBC WITH DIFFERENTIAL (CANCER CENTER ONLY)
Abs Immature Granulocytes: 0.06 10*3/uL (ref 0.00–0.07)
Basophils Absolute: 0 10*3/uL (ref 0.0–0.1)
Basophils Relative: 1 %
Eosinophils Absolute: 0.3 10*3/uL (ref 0.0–0.5)
Eosinophils Relative: 6 %
HCT: 36.4 % (ref 36.0–46.0)
Hemoglobin: 12.4 g/dL (ref 12.0–15.0)
Immature Granulocytes: 1 %
Lymphocytes Relative: 29 %
Lymphs Abs: 1.5 10*3/uL (ref 0.7–4.0)
MCH: 30.3 pg (ref 26.0–34.0)
MCHC: 34.1 g/dL (ref 30.0–36.0)
MCV: 89 fL (ref 80.0–100.0)
Monocytes Absolute: 0.7 10*3/uL (ref 0.1–1.0)
Monocytes Relative: 13 %
Neutro Abs: 2.6 10*3/uL (ref 1.7–7.7)
Neutrophils Relative %: 50 %
Platelet Count: 184 10*3/uL (ref 150–400)
RBC: 4.09 MIL/uL (ref 3.87–5.11)
RDW: 16 % — ABNORMAL HIGH (ref 11.5–15.5)
WBC Count: 5.1 10*3/uL (ref 4.0–10.5)
nRBC: 0.6 % — ABNORMAL HIGH (ref 0.0–0.2)

## 2024-02-18 MED ORDER — DEXAMETHASONE SODIUM PHOSPHATE 10 MG/ML IJ SOLN
5.0000 mg | Freq: Once | INTRAMUSCULAR | Status: AC
  Administered 2024-02-18: 5 mg via INTRAVENOUS
  Filled 2024-02-18: qty 1

## 2024-02-18 MED ORDER — SODIUM CHLORIDE 0.9 % IV SOLN
250.0000 mg/m2/d | INTRAVENOUS | Status: DC
  Administered 2024-02-18: 2000 mg via INTRAVENOUS
  Filled 2024-02-18: qty 40

## 2024-02-18 MED ORDER — PROCHLORPERAZINE MALEATE 10 MG PO TABS
10.0000 mg | ORAL_TABLET | Freq: Four times a day (QID) | ORAL | 1 refills | Status: AC | PRN
Start: 2024-02-18 — End: ?

## 2024-02-18 MED ORDER — OXALIPLATIN CHEMO INJECTION 100 MG/20ML
40.0000 mg/m2 | Freq: Once | INTRAVENOUS | Status: AC
  Administered 2024-02-18: 70 mg via INTRAVENOUS
  Filled 2024-02-18: qty 14

## 2024-02-18 MED ORDER — SODIUM CHLORIDE 0.9 % IV SOLN: INTRAVENOUS | Status: DC

## 2024-02-18 MED ORDER — SODIUM CHLORIDE 0.9 % IV SOLN: INTRAVENOUS | Status: AC

## 2024-02-18 MED ORDER — DEXTROSE 5 % IV SOLN: INTRAVENOUS | Status: DC

## 2024-02-18 MED ORDER — PALONOSETRON HCL INJECTION 0.25 MG/5ML
0.2500 mg | Freq: Once | INTRAVENOUS | Status: AC
  Administered 2024-02-18: 0.25 mg via INTRAVENOUS
  Filled 2024-02-18: qty 5

## 2024-02-18 MED ORDER — POTASSIUM CHLORIDE 10 MEQ/100ML IV SOLN: 10.0000 meq | INTRAVENOUS | Status: DC

## 2024-02-18 MED ORDER — POTASSIUM CHLORIDE CRYS ER 20 MEQ PO TBCR
40.0000 meq | EXTENDED_RELEASE_TABLET | Freq: Once | ORAL | Status: AC
  Administered 2024-02-18: 40 meq via ORAL
  Filled 2024-02-18: qty 2

## 2024-02-18 MED ORDER — POTASSIUM CHLORIDE 10 MEQ/100ML IV SOLN
10.0000 meq | INTRAVENOUS | Status: AC
  Administered 2024-02-18: 10 meq via INTRAVENOUS
  Filled 2024-02-18: qty 100

## 2024-02-18 NOTE — Patient Instructions (Signed)
 [facility] - A DEPT OF [place]  Discharge Instructions: Thank you for choosing [facility] to provide your oncology and hematology care.   If you have a lab appointment with the Cancer Center, please go directly to the Cancer Center and check in at the registration area.  Wear comfortable clothing and clothing appropriate for easy access to any Portacath or PICC line.   We strive to give you quality time with your provider. You may need to reschedule your appointment if you arrive late (15 or more minutes).  Arriving late affects you and other patients whose appointments are after yours.  Also, if you miss three or more appointments without notifying the office, you may be dismissed from the clinic at the provider's discretion.      For prescription refill requests, have your pharmacy contact our office and allow 72 hours for refills to be completed.    Today you received the following chemotherapy and/or immunotherapy agents:  Oxaliplatin , Leucovorin  and 5FU      To help prevent nausea and vomiting after your treatment, we encourage you to take your nausea medication as directed.  BELOW ARE SYMPTOMS THAT SHOULD BE REPORTED IMMEDIATELY: *FEVER GREATER THAN 100.4 F (38 C) OR HIGHER *CHILLS OR SWEATING *NAUSEA AND VOMITING THAT IS NOT CONTROLLED WITH YOUR NAUSEA MEDICATION *UNUSUAL SHORTNESS OF BREATH *UNUSUAL BRUISING OR BLEEDING *URINARY PROBLEMS (pain or burning when urinating, or frequent urination) *BOWEL PROBLEMS (unusual diarrhea, constipation, pain near the anus) TENDERNESS IN MOUTH AND THROAT WITH OR WITHOUT PRESENCE OF ULCERS (sore throat, sores in mouth, or a toothache) UNUSUAL RASH, SWELLING OR PAIN  UNUSUAL VAGINAL DISCHARGE OR ITCHING   Items with * indicate a potential emergency and should be followed up as soon as possible or go to the Emergency Department if any problems should occur.  Please show the CHEMOTHERAPY ALERT  CARD or IMMUNOTHERAPY ALERT CARD at check-in to the Emergency Department and triage nurse. Should you have questions after your visit or need to cancel or reschedule your appointment, please contact [facility] - A DEPT OF [facility]  [phone] and follow the prompts.  Office hours are 8:00 a.m. to 4:30 p.m. Monday - Friday. Please note that voicemails left after 4:00 p.m. may not be returned until the following business day.  We are closed weekends and major holidays. You have access to a nurse at all times for urgent questions. Please call the main number to the clinic [phone] and follow the prompts.  For any non-urgent questions, you may also contact your provider using MyChart. We now offer e-Visits for anyone 60 and older to request care online for non-urgent symptoms. For details visit [email or web address].   Also download the MyChart app! Go to the app store, search "MyChart", open the app, select [place], and log in with your MyChart username and password.

## 2024-02-18 NOTE — Patient Instructions (Signed)

## 2024-02-18 NOTE — Progress Notes (Signed)
 Hematology and Oncology Follow Up Visit  [name] [id number] 05/09/1929 89 y.o. 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 3 weeks-s/p cycle #1  -- start on 02/09/2023 -- d/c on 04/12/2023 Xeloda  1500 mg po BID (14/7) -- s/p cycle #6 on 02/13/2023 -- changed to 1000mg  po BID on 08/31/2023 --on hold since 08/2023  5-FU/Oxaliplatin  +XRT -- s/p cycle #3 -start on 01/28/2024     Interim History:  [name] is back for follow-up.  She had a lot of problems with diarrhea at this time.  This is better right now.  Sure that the radiation is starting to affect her.  I really am not surprised.  I think she will need some IV fluid today.  I think she is IV potassium.  Her potassium is only 3.1.  She has had no problems with pain.  Her abdomen does look a little bit distended.  I suspect she may have a lot of gas.  I told her to try some Mylanta or Maalox to see if this may help.  She has had a decent appetite.  She has had no mouth sores.  She has had no nausea or vomiting.  She has had no rashes.  There is been no leg swelling.  She has had no headache.  Her last CEA that we checked on her was 1.18.  Currently, I would have to say that her performance status is probably ECOG 1.     Medications:  Current Outpatient Medications:    acetaminophen  (TYLENOL ) 325 MG tablet, Take 2 tablets (650 mg total) by mouth every 6 (six) hours as needed for mild pain (or Fever >/= 101)., Disp: 30 tablet, Rfl: 0   ALPRAZolam  (XANAX ) 0.5 MG tablet, Take 0.5 mg by mouth 3 (three) times daily as needed for anxiety. For anxiety, Disp: , Rfl:    amiodarone  (PACERONE ) 200 MG tablet, Take 0.5 tablets (100 mg total) by mouth daily., Disp: 45 tablet, Rfl: 2   amLODipine  (NORVASC ) 10 MG tablet, Take 10 mg by mouth daily., Disp: , Rfl:    Blood Glucose Monitoring Suppl (TRUE METRIX AIR GLUCOSE  METER) w/Device KIT, Check blood sugar q daily Dx-E11.9, Disp: , Rfl:    cholecalciferol (VITAMIN D3) 25 MCG (1000 UNIT) tablet, Take 1,000 Units by mouth daily., Disp: , Rfl:    clotrimazole  (LOTRIMIN ) 1 % cream, Apply 1 Application topically 2 (two) times daily., Disp: 30 g, Rfl: 0   cyanocobalamin  1000 MCG tablet, Take 1 tablet (1,000 mcg total) by mouth daily., Disp: 30 tablet, Rfl: 0   escitalopram  (LEXAPRO ) 5 MG tablet, Take 5 mg by mouth at bedtime., Disp: , Rfl:    feeding supplement (ENSURE ENLIVE / ENSURE PLUS) LIQD, Take 237 mLs by mouth 3 (three) times daily with meals. (Patient taking differently: Take 237 mLs by mouth 3 (three) times daily as needed (nutrition).), Disp: 237 mL, Rfl: 12   fluticasone  (FLONASE ) 50 MCG/ACT nasal spray, Place 2 sprays into both nostrils daily., Disp: 11.1 mL, Rfl: 0   hydrALAZINE  (APRESOLINE ) 25 MG tablet, Take 1 tablet (25 mg total) by mouth 2 (two) times daily., Disp: 180 tablet, Rfl: 3   levothyroxine  (SYNTHROID ) 175 MCG tablet, Take 175 mcg by mouth every morning., Disp: , Rfl:    lidocaine -prilocaine  (EMLA ) cream, Apply to affected area once, Disp: 30 g, Rfl: 3   loperamide  (IMODIUM  A-D) 2 MG tablet,  Take 2 mg by mouth 4 (four) times daily as needed for diarrhea or loose stools., Disp: , Rfl:    loratadine  (CLARITIN ) 10 MG tablet, Take 1 tablet (10 mg total) by mouth daily., Disp: 30 tablet, Rfl: 0   ondansetron  (ZOFRAN ) 8 MG tablet, Take 1 tablet (8 mg total) by mouth every 8 (eight) hours as needed for nausea or vomiting., Disp: 30 tablet, Rfl: 1   potassium chloride  SA (K-DUR,KLOR-CON ) 20 MEQ tablet, Take 20 mEq by mouth daily., Disp: , Rfl:    prochlorperazine  (COMPAZINE ) 10 MG tablet, Take 1 tablet (10 mg total) by mouth every 6 (six) hours as needed., Disp: 30 tablet, Rfl: 1   prochlorperazine  (COMPAZINE ) 10 MG tablet, Take 1 tablet (10 mg total) by mouth every 6 (six) hours as needed for nausea or vomiting., Disp: 30 tablet, Rfl: 1    triamterene -hydrochlorothiazide  (MAXZIDE-25) 37.5-25 MG tablet, Take 1 tablet by mouth daily., Disp: 90 tablet, Rfl: 1   TRUE METRIX BLOOD GLUCOSE TEST test strip, , Disp: , Rfl:    capecitabine  (XELODA ) 500 MG tablet, Take 500 mg by mouth 2 (two) times daily after a meal. Take 3 tablets orally twice a day after a meal. 14 days on, 7 days off. (Patient not taking: Reported on 02/18/2024), Disp: , Rfl:    famotidine (PEPCID) 20 MG tablet, Take 20 mg by mouth daily. (Patient not taking: Reported on 02/18/2024), Disp: , Rfl:    nystatin -triamcinolone  ointment (MYCOLOG), Apply 1 Application topically 2 (two) times daily. (Patient not taking: Reported on 02/18/2024), Disp: 30 g, Rfl: 0   polyethylene glycol (MIRALAX  / GLYCOLAX ) 17 g packet, Take 17 g by mouth daily as needed for mild constipation. (Patient not taking: Reported on 02/18/2024), Disp: 14 each, Rfl: 0  Allergies:  Allergies  Allergen Reactions   Aggrenox  [Aspirin -Dipyridamole  Er] Other (See Comments)    Severe Bleeding and Stomach Pain   Prednisone  Other (See Comments)    Climbs the walls    Past Medical History, Surgical history, Social history, and Family History were reviewed and updated.  Review of Systems: Review of Systems  Constitutional:  Positive for fatigue.  HENT:  Negative.    Eyes: Negative.   Respiratory: Negative.    Cardiovascular:  Positive for palpitations.  Gastrointestinal:  Positive for blood in stool and rectal pain.  Endocrine: Negative.   Genitourinary: Negative.    Musculoskeletal: Negative.   Skin: Negative.   Neurological:  Positive for light-headedness.  Hematological: Negative.   Psychiatric/Behavioral: Negative.      Physical Exam: Her vital signs show temperature of 97.9.  Pulse 71.  Blood pressure 130/49.  Weight is 168 lb.     Wt Readings from Last 3 Encounters:  02/18/24 168 lb (76.2 kg)  02/11/24 169 lb (76.7 kg)  02/05/24 170 lb (77.1 kg)    Physical Exam Vitals reviewed.  HENT:      Head: Normocephalic and atraumatic.  Eyes:     Pupils: Pupils are equal, round, and reactive to light.  Cardiovascular:     Rate and Rhythm: Normal rate and regular rhythm.     Heart sounds: Normal heart sounds.     Comments: Cardiac exam is regular rate and rhythm.  She has no murmurs, rubs or bruits. Pulmonary:     Effort: Pulmonary effort is normal.     Breath sounds: Normal breath sounds.     Comments: She has good air movement bilaterally.  She has some crackles bilaterally.  I hear no  wheezes. Abdominal:     General: Bowel sounds are normal.     Palpations: Abdomen is soft.  Musculoskeletal:        General: No tenderness or deformity. Normal range of motion.     Cervical back: Normal range of motion.  Lymphadenopathy:     Cervical: No cervical adenopathy.  Skin:    General: Skin is warm and dry.     Findings: No erythema or rash.  Neurological:     Mental Status: She is alert and oriented to person, place, and time.  Psychiatric:        Behavior: Behavior normal.        Thought Content: Thought content normal.        Judgment: Judgment normal.     Lab Results  Component Value Date   WBC 5.1 02/18/2024   HGB 12.4 02/18/2024   HCT 36.4 02/18/2024   MCV 89.0 02/18/2024   PLT 184 02/18/2024     Chemistry      Component Value Date/Time   NA 131 (L) 02/18/2024 0935   K 3.1 (L) 02/18/2024 0935   CL 93 (L) 02/18/2024 0935   CO2 30 02/18/2024 0935   BUN 13 02/18/2024 0935   CREATININE 0.92 02/18/2024 0935      Component Value Date/Time   CALCIUM  8.8 (L) 02/18/2024 0935   ALKPHOS 65 02/18/2024 0935   AST 23 02/18/2024 0935   ALT 22 02/18/2024 0935   BILITOT 0.8 02/18/2024 0935      Impression and Plan: [name] is a very charming 89 year old white female.  She has at least a locally advanced adenocarcinoma of the rectum.-I would have to say that she has had metastatic disease since the recent CT scan that she had done did not show any evidence of the pulmonary  nodule.  She now has recurrence.  I suspect that his recurrence is local..  I feel that the PET scan helps confirm that this is a local recurrence.  She is getting chemotherapy with radiotherapy.  She is on infusional 5-FU pump.  I think this is very helpful for her.  Again we will give her some IV fluid today.  She is little bit dehydrated.  Her potassium is on the low side.  I think she probably has about a 3 more weeks of radiation left.  Overall, I really think she has done incredibly nicely.  I am very happy for her.  We to follow her closely.  Will have her come back next week.   Ivor Mars, MD 6/9/202511:14 AM

## 2024-02-19 ENCOUNTER — Ambulatory Visit
Admission: RE | Admit: 2024-02-19 | Discharge: 2024-02-19 | Disposition: A | Discharge status: Home / Self Care | Source: Ambulatory Visit | Attending: Radiation Oncology | Admitting: Radiation Oncology

## 2024-02-19 ENCOUNTER — Other Ambulatory Visit: Payer: Self-pay

## 2024-02-19 ENCOUNTER — Other Ambulatory Visit: Payer: Self-pay | Admitting: Radiation Oncology

## 2024-02-19 ENCOUNTER — Ambulatory Visit

## 2024-02-19 ENCOUNTER — Ambulatory Visit
Admission: RE | Admit: 2024-02-19 | Discharge: 2024-02-19 | Discharge status: Home / Self Care | Source: Ambulatory Visit | Attending: Radiation Oncology | Admitting: Radiation Oncology

## 2024-02-19 DIAGNOSIS — C20 Malignant neoplasm of rectum: Secondary | ICD-10-CM | POA: Diagnosis not present

## 2024-02-19 LAB — RAD ONC ARIA SESSION SUMMARY
Course Elapsed Days: 13
Plan Fractions Treated to Date: 10
Plan Prescribed Dose Per Fraction: 1.8 Gy
Plan Total Fractions Prescribed: 25
Plan Total Prescribed Dose: 45 Gy
Reference Point Dosage Given to Date: 18 Gy
Reference Point Session Dosage Given: 1.8 Gy
Session Number: 10

## 2024-02-20 ENCOUNTER — Other Ambulatory Visit: Payer: Self-pay

## 2024-02-20 ENCOUNTER — Ambulatory Visit
Admission: RE | Admit: 2024-02-20 | Discharge: 2024-02-20 | Disposition: A | Discharge status: Home / Self Care | Source: Ambulatory Visit | Attending: Radiation Oncology | Admitting: Radiation Oncology

## 2024-02-20 ENCOUNTER — Encounter: Payer: Self-pay | Admitting: Hematology & Oncology

## 2024-02-20 DIAGNOSIS — C20 Malignant neoplasm of rectum: Secondary | ICD-10-CM | POA: Diagnosis not present

## 2024-02-20 LAB — RAD ONC ARIA SESSION SUMMARY
Course Elapsed Days: 14
Plan Fractions Treated to Date: 11
Plan Prescribed Dose Per Fraction: 1.8 Gy
Plan Total Fractions Prescribed: 25
Plan Total Prescribed Dose: 45 Gy
Reference Point Dosage Given to Date: 19.8 Gy
Reference Point Session Dosage Given: 1.8 Gy
Session Number: 11

## 2024-02-21 ENCOUNTER — Other Ambulatory Visit: Payer: Self-pay

## 2024-02-21 ENCOUNTER — Ambulatory Visit
Admission: RE | Admit: 2024-02-21 | Discharge: 2024-02-21 | Disposition: A | Discharge status: Home / Self Care | Source: Ambulatory Visit | Attending: Radiation Oncology | Admitting: Radiation Oncology

## 2024-02-21 ENCOUNTER — Other Ambulatory Visit: Payer: Self-pay | Admitting: Radiology

## 2024-02-21 ENCOUNTER — Encounter: Payer: Self-pay | Admitting: Radiology

## 2024-02-21 ENCOUNTER — Encounter: Payer: Self-pay | Admitting: Hematology & Oncology

## 2024-02-21 DIAGNOSIS — C20 Malignant neoplasm of rectum: Secondary | ICD-10-CM | POA: Diagnosis not present

## 2024-02-21 DIAGNOSIS — R197 Diarrhea, unspecified: Secondary | ICD-10-CM

## 2024-02-21 LAB — RAD ONC ARIA SESSION SUMMARY
Course Elapsed Days: 15
Plan Fractions Treated to Date: 12
Plan Prescribed Dose Per Fraction: 1.8 Gy
Plan Total Fractions Prescribed: 25
Plan Total Prescribed Dose: 45 Gy
Reference Point Dosage Given to Date: 21.6 Gy
Reference Point Session Dosage Given: 1.8 Gy
Session Number: 12

## 2024-02-21 LAB — BASIC METABOLIC PANEL - CANCER CENTER ONLY
Anion gap: 13 (ref 5–15)
BUN: 12 mg/dL (ref 8–23)
CO2: 26 mmol/L (ref 22–32)
Calcium: 9 mg/dL (ref 8.9–10.3)
Chloride: 92 mmol/L — ABNORMAL LOW (ref 98–111)
Creatinine: 0.76 mg/dL (ref 0.44–1.00)
GFR, Estimated: >60 mL/min (ref >60)
Glucose, Bld: 152 mg/dL — ABNORMAL HIGH (ref 70–99)
Potassium: 3.5 mmol/L (ref 3.5–5.1)
Sodium: 131 mmol/L — ABNORMAL LOW (ref 135–145)

## 2024-02-21 LAB — MAGNESIUM: Magnesium: 1.9 mg/dL (ref 1.7–2.4)

## 2024-02-21 MED ORDER — DIPHENOXYLATE-ATROPINE 2.5-0.025 MG PO TABS
1.0000 | ORAL_TABLET | Freq: Once | ORAL | Status: AC
  Administered 2024-02-21: 1 via ORAL

## 2024-02-21 MED ORDER — DIPHENOXYLATE-ATROPINE 2.5-0.025 MG PO TABS: 1.0000 | ORAL_TABLET | Freq: Four times a day (QID) | ORAL | 1 refills | Status: AC | PRN

## 2024-02-21 NOTE — Progress Notes (Signed)
 Patient received lomotil 2.5mg . Pharmacist confirmed no drug- drug interaction with Lomotil and Aggrenox . Patient tolerated medication well .

## 2024-02-22 ENCOUNTER — Inpatient Hospital Stay

## 2024-02-22 ENCOUNTER — Ambulatory Visit
Admission: RE | Admit: 2024-02-22 | Discharge: 2024-02-22 | Disposition: A | Discharge status: Home / Self Care | Source: Ambulatory Visit | Attending: Radiation Oncology | Admitting: Radiation Oncology

## 2024-02-22 ENCOUNTER — Other Ambulatory Visit: Payer: Self-pay

## 2024-02-22 VITALS — BP 137/55 | HR 76 | Temp 98.3°F | Resp 16

## 2024-02-22 DIAGNOSIS — C20 Malignant neoplasm of rectum: Secondary | ICD-10-CM

## 2024-02-22 DIAGNOSIS — D500 Iron deficiency anemia secondary to blood loss (chronic): Secondary | ICD-10-CM

## 2024-02-22 LAB — RAD ONC ARIA SESSION SUMMARY
Course Elapsed Days: 16
Plan Fractions Treated to Date: 13
Plan Prescribed Dose Per Fraction: 1.8 Gy
Plan Total Fractions Prescribed: 25
Plan Total Prescribed Dose: 45 Gy
Reference Point Dosage Given to Date: 23.4 Gy
Reference Point Session Dosage Given: 1.8 Gy
Session Number: 13

## 2024-02-22 MED ORDER — HEPARIN SOD (PORK) LOCK FLUSH 100 UNIT/ML IV SOLN
250.0000 [IU] | Freq: Once | INTRAVENOUS | Status: AC
  Administered 2024-02-22: 250 [IU]

## 2024-02-22 MED ORDER — SODIUM CHLORIDE 0.9% FLUSH
10.0000 mL | Freq: Once | INTRAVENOUS | Status: AC
  Administered 2024-02-22: 10 mL

## 2024-02-22 NOTE — Progress Notes (Signed)
 Patient continues to experience diarrhea while taking imodium  as prescribed. Prescription for lomotil  was sent to her pharmacy today. Patient was also educated on [employer]   Of note, when ordering her prescription, there was a notification of interaction between lomotil  and her aggrenox  allergy. I entered the drugs into UpToDate drug interactions which showed no interactions of Risk Level A or greater identified (see below). I also spoke with a pharmacist at her local pharmacy who stated that the lomotil  should not interact with her allergy, and if it is flagged, they will address it.     Julio Ohm, PA-C

## 2024-02-23 ENCOUNTER — Encounter: Payer: Self-pay | Admitting: Radiation Oncology

## 2024-02-23 NOTE — Progress Notes (Signed)
 I received a call from the on call nurse about [name]. Her family reports copious frequent diarrhea despite taking 5 lomotil  tablets so far today.  Not taking Imodium .   Advised nurse to tell pt/family to increase lomotil  to 2 tablets q 4 hrs PRN.  She can also take an Imodium  in between the lomotil  doses if diarrhea returns (May take 1 tablet Imodium  up to QID PRN).  If pt shows signs of lethargy or dehydration despite pushing fluids, advised to go to ED.  -----------------------------------  Colie Dawes, MD

## 2024-02-24 ENCOUNTER — Encounter: Payer: Self-pay | Admitting: Radiation Oncology

## 2024-02-24 NOTE — Progress Notes (Signed)
 I was paged at 11:40pm this evening and notified by the on-call nurse that patient's family called reporting patient was weaker.  Diarrhea initially improved after increased meds, but then came back full force.  It is copious and frequent, despite meds. Nurse reiterated my instructions for patient to go to ED but family wished to speak with me.  I spoke with [name] her son and he reported she has been drinking water and some toast; has been having nausea.  Hard to quantify how much she is taking in...maybe one bottle (pint) every 90 minutes for water intake.  She is currently sleeping.  [name] would really rather not take her to the ED right now.  Advised him to give her 1 Imodium  and 2 Lomotil  tablets when she wakes up. May repeat every that every 3 hours as needed for diarrhea. Give Compazine  and as much Gatorade as she will drink.  If she seems worse at all, take her to the ED.    I am ccing her team to call her in the AM. This may be related to her 5FU and not just her radiation.   -----------------------------------  Christina Dawes, MD

## 2024-02-24 NOTE — Progress Notes (Incomplete)
 I was paged at 11:40pm this evening and notified by the on-call nurse that patient's family called reporting patient was weaker.  Diarrhea initially improved aftered increased meds, but then came back full force. Nurse reiterated my instructions for patient to go to ED but family wished to speak with me.  I spoke with Vallorie Gayer her son and he reported she has been drinking water and some toast; has been having nausea.  Hard to quantify how much she is taking in...maybe one bottle (pint) every 90 minutes for water intake.  She is currently sleeping.  Vallorie Gayer would really rather not take her to the ED right.  Advised him to give her 1 Imodium  and 2 Lomotils when she wakes up.

## 2024-02-25 ENCOUNTER — Inpatient Hospital Stay

## 2024-02-25 ENCOUNTER — Telehealth: Payer: Self-pay

## 2024-02-25 ENCOUNTER — Inpatient Hospital Stay (HOSPITAL_BASED_OUTPATIENT_CLINIC_OR_DEPARTMENT_OTHER): Admitting: Medical Oncology

## 2024-02-25 ENCOUNTER — Ambulatory Visit

## 2024-02-25 ENCOUNTER — Encounter: Payer: Self-pay | Admitting: Hematology & Oncology

## 2024-02-25 ENCOUNTER — Other Ambulatory Visit: Payer: Self-pay | Admitting: Medical Oncology

## 2024-02-25 ENCOUNTER — Other Ambulatory Visit: Payer: Self-pay | Admitting: *Deleted

## 2024-02-25 ENCOUNTER — Encounter: Payer: Self-pay | Admitting: Medical Oncology

## 2024-02-25 VITALS — BP 134/55 | HR 64

## 2024-02-25 VITALS — BP 159/58 | HR 78 | Temp 98.0°F | Resp 19 | Ht 60.0 in | Wt 168.9 lb

## 2024-02-25 DIAGNOSIS — R197 Diarrhea, unspecified: Secondary | ICD-10-CM

## 2024-02-25 DIAGNOSIS — E876 Hypokalemia: Secondary | ICD-10-CM

## 2024-02-25 DIAGNOSIS — E860 Dehydration: Secondary | ICD-10-CM

## 2024-02-25 DIAGNOSIS — R195 Other fecal abnormalities: Secondary | ICD-10-CM

## 2024-02-25 DIAGNOSIS — I10 Essential (primary) hypertension: Secondary | ICD-10-CM

## 2024-02-25 DIAGNOSIS — C20 Malignant neoplasm of rectum: Secondary | ICD-10-CM

## 2024-02-25 DIAGNOSIS — I495 Sick sinus syndrome: Secondary | ICD-10-CM

## 2024-02-25 DIAGNOSIS — K521 Toxic gastroenteritis and colitis: Secondary | ICD-10-CM

## 2024-02-25 DIAGNOSIS — I480 Paroxysmal atrial fibrillation: Secondary | ICD-10-CM

## 2024-02-25 DIAGNOSIS — R062 Wheezing: Secondary | ICD-10-CM

## 2024-02-25 DIAGNOSIS — T451X5A Adverse effect of antineoplastic and immunosuppressive drugs, initial encounter: Secondary | ICD-10-CM

## 2024-02-25 DIAGNOSIS — E871 Hypo-osmolality and hyponatremia: Secondary | ICD-10-CM

## 2024-02-25 DIAGNOSIS — E440 Moderate protein-calorie malnutrition: Secondary | ICD-10-CM

## 2024-02-25 DIAGNOSIS — K625 Hemorrhage of anus and rectum: Secondary | ICD-10-CM

## 2024-02-25 DIAGNOSIS — E119 Type 2 diabetes mellitus without complications: Secondary | ICD-10-CM

## 2024-02-25 DIAGNOSIS — K922 Gastrointestinal hemorrhage, unspecified: Secondary | ICD-10-CM

## 2024-02-25 DIAGNOSIS — F419 Anxiety disorder, unspecified: Secondary | ICD-10-CM

## 2024-02-25 DIAGNOSIS — D500 Iron deficiency anemia secondary to blood loss (chronic): Secondary | ICD-10-CM

## 2024-02-25 LAB — CEA (ACCESS): CEA (CHCC): 1.73 ng/mL (ref 0.00–5.00)

## 2024-02-25 LAB — CMP (CANCER CENTER ONLY)
ALT: 30 U/L (ref 0–44)
AST: 16 U/L (ref 15–41)
Albumin: 3.5 g/dL (ref 3.5–5.0)
Alkaline Phosphatase: 61 U/L (ref 38–126)
Anion gap: 9 (ref 5–15)
BUN: 9 mg/dL (ref 8–23)
CO2: 27 mmol/L (ref 22–32)
Calcium: 8.4 mg/dL — ABNORMAL LOW (ref 8.9–10.3)
Chloride: 91 mmol/L — ABNORMAL LOW (ref 98–111)
Creatinine: 0.81 mg/dL (ref 0.44–1.00)
GFR, Estimated: >60 mL/min (ref >60)
Glucose, Bld: 144 mg/dL — ABNORMAL HIGH (ref 70–99)
Potassium: 3.4 mmol/L — ABNORMAL LOW (ref 3.5–5.1)
Sodium: 127 mmol/L — ABNORMAL LOW (ref 135–145)
Total Bilirubin: 0.7 mg/dL (ref 0.0–1.2)
Total Protein: 5.5 g/dL — ABNORMAL LOW (ref 6.5–8.1)

## 2024-02-25 LAB — CBC WITH DIFFERENTIAL (CANCER CENTER ONLY)
Abs Immature Granulocytes: 0.08 10*3/uL — ABNORMAL HIGH (ref 0.00–0.07)
Basophils Absolute: 0 10*3/uL (ref 0.0–0.1)
Basophils Relative: 1 %
Eosinophils Absolute: 0.1 10*3/uL (ref 0.0–0.5)
Eosinophils Relative: 3 %
HCT: 33.4 % — ABNORMAL LOW (ref 36.0–46.0)
Hemoglobin: 11.5 g/dL — ABNORMAL LOW (ref 12.0–15.0)
Immature Granulocytes: 3 %
Lymphocytes Relative: 17 %
Lymphs Abs: 0.5 10*3/uL — ABNORMAL LOW (ref 0.7–4.0)
MCH: 30.7 pg (ref 26.0–34.0)
MCHC: 34.4 g/dL (ref 30.0–36.0)
MCV: 89.1 fL (ref 80.0–100.0)
Monocytes Absolute: 0.6 10*3/uL (ref 0.1–1.0)
Monocytes Relative: 19 %
Neutro Abs: 1.8 10*3/uL (ref 1.7–7.7)
Neutrophils Relative %: 57 %
Platelet Count: 145 10*3/uL — ABNORMAL LOW (ref 150–400)
RBC: 3.75 MIL/uL — ABNORMAL LOW (ref 3.87–5.11)
RDW: 17.2 % — ABNORMAL HIGH (ref 11.5–15.5)
WBC Count: 3.2 10*3/uL — ABNORMAL LOW (ref 4.0–10.5)
nRBC: 0.9 % — ABNORMAL HIGH (ref 0.0–0.2)

## 2024-02-25 LAB — C DIFFICILE QUICK SCREEN W PCR REFLEX
C Diff antigen: NEGATIVE
C Diff interpretation: NOT DETECTED
C Diff toxin: NEGATIVE

## 2024-02-25 LAB — MAGNESIUM: Magnesium: 1.6 mg/dL — ABNORMAL LOW (ref 1.7–2.4)

## 2024-02-25 MED ORDER — HEPARIN SOD (PORK) LOCK FLUSH 100 UNIT/ML IV SOLN
500.0000 [IU] | Freq: Once | INTRAVENOUS | Status: AC
  Administered 2024-02-25: 500 [IU] via INTRAVENOUS

## 2024-02-25 MED ORDER — PANTOPRAZOLE SODIUM 20 MG PO TBEC: 20.0000 mg | DELAYED_RELEASE_TABLET | Freq: Every morning | ORAL | 0 refills | Status: DC

## 2024-02-25 MED ORDER — TRIAMCINOLONE ACETONIDE 0.025 % EX OINT: 1.0000 | TOPICAL_OINTMENT | Freq: Two times a day (BID) | CUTANEOUS | 0 refills | Status: AC

## 2024-02-25 MED ORDER — SODIUM CHLORIDE 0.9% FLUSH: 10.0000 mL | INTRAVENOUS | Status: DC | PRN

## 2024-02-25 MED ORDER — SODIUM CHLORIDE 0.9 % IV SOLN: INTRAVENOUS | Status: DC

## 2024-02-25 MED ORDER — POTASSIUM CHLORIDE 10 MEQ/100ML IV SOLN
10.0000 meq | INTRAVENOUS | Status: AC
  Filled 2024-02-25: qty 100

## 2024-02-25 MED ORDER — SODIUM CHLORIDE 0.9 % IV SOLN: 2.0000 g | Freq: Once | INTRAVENOUS | Status: DC

## 2024-02-25 MED ORDER — MAGNESIUM SULFATE 2 GM/50ML IV SOLN
2.0000 g | Freq: Once | INTRAVENOUS | Status: AC
  Filled 2024-02-25: qty 50

## 2024-02-25 NOTE — Addendum Note (Signed)
 Addended by: Sunnie England on: 02/25/2024 01:34 PM   Modules accepted: Orders

## 2024-02-25 NOTE — Telephone Encounter (Signed)
 Dr. Maria Shiner requested that this RN call patient and check in how how they are doing and get patient into clinic to be evaluated. Pt already scheduled for labs/flush/provider and treatment today.  This RN received phone call from patient daughter in law, [name] stating that patient continues to have diarrhea multiple times an hour with no relief from imodium  or Lomitl.  [name] concerned about how to get her mother in law to the clinic to be evaluated. This RN educated [name] that it is important to get the patient here for evaluation and labs and fluid.  [name] educated to bring extra clothes and extra diapers. [name] educated to put towels down in the car to help.  [name] requesting a private room so patient doesn't get embarrassed if she has an accident.  [name] educated that all attempts will be made to put patient in bed room but no guarantee but the staff is very discreet at handling these kind of issues. [name] stated she will get patient here for evaluation and appreciative of call. No further needs identified.

## 2024-02-25 NOTE — Progress Notes (Addendum)
 Hematology and Oncology Follow Up Visit  [name] [id number] September 15, 1928 89 y.o. 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 3 weeks-s/p cycle #1  -- start on 02/09/2023 -- d/c on 04/12/2023 Xeloda  1500 mg po BID (14/7) -- s/p cycle #6 on 02/13/2023 -- changed to 1000mg  po BID on 08/31/2023 --on hold since 08/2023  5-FU/Oxaliplatin  +XRT -- s/p cycle #3 -start on 01/28/2024     Interim History:  [name] is back for follow-up and consideration of treatment.   Diarrhea has been very problematic for her again. This restarted around Friday. [name] has had diarrhea about every 15 minutes off an on. Last bout of diarrhea was around 6 am this morning. Diarrhea is non-bloody. [name] denies fevers but has had mild bloating and GERD. [name] is able to pass all flatulence. [name] has used lomotil  2 tablets every 4 hours since 1 am which has slowed her stools significantly.   [name] has had a decent appetite.  [name] has had no mouth sores.  [name] has had no nausea or vomiting.  [name] has had no rashes.  There is been no leg swelling.  [name] has had no headache.  Her last CEA that we checked on her was 1.18.  Currently, I would have to say that her performance status is probably ECOG 1.   Wt Readings from Last 3 Encounters:  02/25/24 168 lb 14.4 oz (76.6 kg)  02/18/24 168 lb (76.2 kg)  02/11/24 169 lb (76.7 kg)   Medications:  Current Outpatient Medications:    acetaminophen  (TYLENOL ) 325 MG tablet, Take 2 tablets (650 mg total) by mouth every 6 (six) hours as needed for mild pain (or Fever >/= 101)., Disp: 30 tablet, Rfl: 0   ALPRAZolam  (XANAX ) 0.5 MG tablet, Take 0.5 mg by mouth 3 (three) times daily as needed for anxiety. For anxiety, Disp: , Rfl:    amiodarone  (PACERONE ) 200 MG tablet, Take 0.5 tablets (100 mg total) by mouth daily., Disp: 45 tablet, Rfl: 3   amLODipine  (NORVASC ) 10 MG  tablet, Take 10 mg by mouth daily., Disp: , Rfl:    Blood Glucose Monitoring Suppl (TRUE METRIX AIR GLUCOSE METER) w/Device KIT, Check blood sugar q daily Dx-E11.9, Disp: , Rfl:    cholecalciferol (VITAMIN D3) 25 MCG (1000 UNIT) tablet, Take 1,000 Units by mouth daily., Disp: , Rfl:    clotrimazole  (LOTRIMIN ) 1 % cream, Apply 1 Application topically 2 (two) times daily., Disp: 30 g, Rfl: 0   cyanocobalamin  1000 MCG tablet, Take 1 tablet (1,000 mcg total) by mouth daily., Disp: 30 tablet, Rfl: 0   diphenoxylate -atropine  (LOMOTIL ) 2.5-0.025 MG tablet, Take 1 tablet by mouth 4 (four) times daily as needed for diarrhea or loose stools. Take 1-2 tabs PO bid-qid prn; Max: 8 tabs/day; Info: reduce dose when sx controlled; D/C after 10 days if no improvement, Disp: 80 tablet, Rfl: 1   escitalopram  (LEXAPRO ) 5 MG tablet, Take 5 mg by mouth at bedtime., Disp: , Rfl:    famotidine (PEPCID) 20 MG tablet, Take 20 mg by mouth daily., Disp: , Rfl:    fluticasone  (FLONASE ) 50 MCG/ACT nasal spray, Place 2 sprays into both nostrils daily., Disp: 11.1 mL, Rfl: 0   hydrALAZINE  (APRESOLINE ) 25 MG tablet, Take 1 tablet (25 mg total) by mouth 2 (two) times daily., Disp: 180 tablet, Rfl: 3   levothyroxine  (SYNTHROID ) 175 MCG tablet, Take 175 mcg by  mouth every morning., Disp: , Rfl:    lidocaine -prilocaine  (EMLA ) cream, Apply to affected area once, Disp: 30 g, Rfl: 3   loperamide  (IMODIUM  A-D) 2 MG tablet, Take 2 mg by mouth 4 (four) times daily as needed for diarrhea or loose stools., Disp: , Rfl:    loratadine  (CLARITIN ) 10 MG tablet, Take 1 tablet (10 mg total) by mouth daily., Disp: 30 tablet, Rfl: 0   nystatin -triamcinolone  ointment (MYCOLOG), Apply 1 Application topically 2 (two) times daily., Disp: 30 g, Rfl: 0   ondansetron  (ZOFRAN ) 8 MG tablet, Take 1 tablet (8 mg total) by mouth every 8 (eight) hours as needed for nausea or vomiting., Disp: 30 tablet, Rfl: 1   polyethylene glycol (MIRALAX  / GLYCOLAX ) 17 g packet,  Take 17 g by mouth daily as needed for mild constipation., Disp: 14 each, Rfl: 0   potassium chloride  SA (K-DUR,KLOR-CON ) 20 MEQ tablet, Take 20 mEq by mouth daily., Disp: , Rfl:    prochlorperazine  (COMPAZINE ) 10 MG tablet, Take 1 tablet (10 mg total) by mouth every 6 (six) hours as needed., Disp: 30 tablet, Rfl: 1   prochlorperazine  (COMPAZINE ) 10 MG tablet, Take 1 tablet (10 mg total) by mouth every 6 (six) hours as needed for nausea or vomiting., Disp: 30 tablet, Rfl: 1   triamterene -hydrochlorothiazide  (MAXZIDE-25) 37.5-25 MG tablet, Take 1 tablet by mouth daily., Disp: 90 tablet, Rfl: 1   TRUE METRIX BLOOD GLUCOSE TEST test strip, , Disp: , Rfl:    feeding supplement (ENSURE ENLIVE / ENSURE PLUS) LIQD, Take 237 mLs by mouth 3 (three) times daily with meals. (Patient not taking: Reported on 02/25/2024), Disp: 237 mL, Rfl: 12  Allergies:  Allergies  Allergen Reactions   Aggrenox  [Aspirin -Dipyridamole  Er] Other (See Comments)    Severe Bleeding and Stomach Pain   Prednisone  Other (See Comments)    Climbs the walls    Past Medical History, Surgical history, Social history, and Family History were reviewed and updated.  Review of Systems: Review of Systems  Constitutional:  Positive for fatigue.  HENT:  Negative.    Eyes: Negative.   Respiratory: Negative.    Cardiovascular:  Positive for palpitations.  Gastrointestinal:  Positive for diarrhea. Negative for blood in stool and rectal pain.  Endocrine: Negative.   Genitourinary: Negative.    Musculoskeletal: Negative.   Skin: Negative.   Neurological:  Negative for light-headedness.  Hematological: Negative.   Psychiatric/Behavioral: Negative.      Physical Exam: Vitals:   02/25/24 1039  BP: (!) 159/58  Pulse: 78  Resp: 19  Temp: 98 F (36.7 C)  SpO2: 94%    Wt Readings from Last 3 Encounters:  02/25/24 168 lb 14.4 oz (76.6 kg)  02/18/24 168 lb (76.2 kg)  02/11/24 169 lb (76.7 kg)    Physical Exam Vitals reviewed.   HENT:     Head: Normocephalic and atraumatic.   Eyes:     Pupils: Pupils are equal, round, and reactive to light.    Cardiovascular:     Rate and Rhythm: Normal rate and regular rhythm.     Heart sounds: Normal heart sounds.     Comments: Cardiac exam is regular rate and rhythm.  [name] has no murmurs, rubs or bruits. Pulmonary:     Effort: Pulmonary effort is normal.     Breath sounds: Normal breath sounds.     Comments: [name] has good air movement bilaterally Abdominal:     General: Bowel sounds are normal. There is distension (mild).  Palpations: Abdomen is soft. There is no mass.     Tenderness: There is no abdominal tenderness. There is no guarding or rebound.   Musculoskeletal:        General: No tenderness or deformity. Normal range of motion.     Cervical back: Normal range of motion.  Lymphadenopathy:     Cervical: No cervical adenopathy.   Skin:    General: Skin is warm and dry.     Coloration: Skin is not jaundiced or pale.     Findings: No erythema or rash.   Neurological:     Mental Status: [name] is alert and oriented to person, place, and time.   Psychiatric:        Behavior: Behavior normal.        Thought Content: Thought content normal.        Judgment: Judgment normal.     Lab Results  Component Value Date   WBC 3.2 (L) 02/25/2024   HGB 11.5 (L) 02/25/2024   HCT 33.4 (L) 02/25/2024   MCV 89.1 02/25/2024   PLT 145 (L) 02/25/2024     Chemistry      Component Value Date/Time   NA 131 (L) 02/21/2024 1553   K 3.5 02/21/2024 1553   CL 92 (L) 02/21/2024 1553   CO2 26 02/21/2024 1553   BUN 12 02/21/2024 1553   CREATININE 0.76 02/21/2024 1553      Component Value Date/Time   CALCIUM  9.0 02/21/2024 1553   ALKPHOS 65 02/18/2024 0935   AST 23 02/18/2024 0935   ALT 22 02/18/2024 0935   BILITOT 0.8 02/18/2024 0935     Encounter Diagnoses  Name Primary?   Chemotherapy induced diarrhea Yes   Rectal cancer (HCC)    Hypomagnesemia    Hypokalemia      Impression and Plan: [name] is a very charming 89 year old white female.  [name] has at least a locally advanced adenocarcinoma of the rectum. [name] now has suspected local recurrence based on PET. [name] is getting chemotherapy with radiotherapy.  [name] is on infusional 5-FU pump.  Today diarrhea is her biggest concern. Unfortunately with the extent of symptoms I would suggest a holiday from treatment. Cased reviewed and discussed with Dr. Maria Shiner who is in agreement.   CBC shows new mild anemia with a Hgb of 11.5, mild thrombocytopenia with a platelet value of 145. WBC is mildly low at 3.2 with preserved ANC of 1.8.   CMP shows hyponatremia, hypokalemia and hypomagnesemia. Offering supportive treatment today with 1 L IVF, 2 G magnesium  and 20 MEQ K.  Continue Lomotil  but reviewed max dosing guidelines.   Stool studies pending HOLDING TX x 1 week including XRT Supportive treatment today with 1 L IVF, 2 G magnesium  and 20 MEQ K RTC Wednesday or Thursday APP, port labs(CBC w/, CMP, magnesium , CEA), Infusion supportive care (4 hour chair time) RTC 1 week MD, port labs(CBC w/, CMP, magnesium , CEA), TX  UPDATE: Patient had an episode of diarrhea in clinic. Upon cleaning we discovered that her groin was mildly red and raw. [name] has been using nystatin  powder and Desitin without improvement. I will send in triamcinolone  ointment for her to use instead. They will try to avoid any products with scents such as wipes, laundry detergent, fabric softener, soaps. If unimproved will consider oral Diflucan however this poses increased risks compared to topical low dose steroid cream.   Sharla Davis, PA-C 6/16/202510:53 AM

## 2024-02-26 ENCOUNTER — Other Ambulatory Visit: Payer: Self-pay

## 2024-02-26 ENCOUNTER — Emergency Department (HOSPITAL_COMMUNITY)

## 2024-02-26 ENCOUNTER — Inpatient Hospital Stay (HOSPITAL_COMMUNITY)
Admission: EM | Admit: 2024-02-26 | Discharge: 2024-03-08 | DRG: 394 | Disposition: A | Discharge status: Home Health | Attending: Internal Medicine | Admitting: Internal Medicine

## 2024-02-26 ENCOUNTER — Encounter (HOSPITAL_COMMUNITY): Payer: Self-pay

## 2024-02-26 ENCOUNTER — Encounter: Payer: Self-pay | Admitting: Hematology & Oncology

## 2024-02-26 ENCOUNTER — Ambulatory Visit

## 2024-02-26 DIAGNOSIS — J439 Emphysema, unspecified: Secondary | ICD-10-CM | POA: Diagnosis present

## 2024-02-26 DIAGNOSIS — I10 Essential (primary) hypertension: Secondary | ICD-10-CM | POA: Diagnosis present

## 2024-02-26 DIAGNOSIS — Z860100 Personal history of colon polyps, unspecified: Secondary | ICD-10-CM

## 2024-02-26 DIAGNOSIS — G893 Neoplasm related pain (acute) (chronic): Secondary | ICD-10-CM | POA: Diagnosis present

## 2024-02-26 DIAGNOSIS — Z1509 Genetic susceptibility to other malignant neoplasm: Secondary | ICD-10-CM

## 2024-02-26 DIAGNOSIS — R531 Weakness: Secondary | ICD-10-CM

## 2024-02-26 DIAGNOSIS — M810 Age-related osteoporosis without current pathological fracture: Secondary | ICD-10-CM | POA: Diagnosis present

## 2024-02-26 DIAGNOSIS — I480 Paroxysmal atrial fibrillation: Secondary | ICD-10-CM | POA: Diagnosis present

## 2024-02-26 DIAGNOSIS — Z886 Allergy status to analgesic agent status: Secondary | ICD-10-CM

## 2024-02-26 DIAGNOSIS — E66811 Obesity, class 1: Secondary | ICD-10-CM | POA: Diagnosis present

## 2024-02-26 DIAGNOSIS — Z79631 Long term (current) use of antimetabolite agent: Secondary | ICD-10-CM

## 2024-02-26 DIAGNOSIS — E860 Dehydration: Secondary | ICD-10-CM | POA: Diagnosis present

## 2024-02-26 DIAGNOSIS — Z87891 Personal history of nicotine dependence: Secondary | ICD-10-CM

## 2024-02-26 DIAGNOSIS — E785 Hyperlipidemia, unspecified: Secondary | ICD-10-CM | POA: Diagnosis present

## 2024-02-26 DIAGNOSIS — Z888 Allergy status to other drugs, medicaments and biological substances status: Secondary | ICD-10-CM

## 2024-02-26 DIAGNOSIS — Z6832 Body mass index (BMI) 32.0-32.9, adult: Secondary | ICD-10-CM

## 2024-02-26 DIAGNOSIS — E119 Type 2 diabetes mellitus without complications: Secondary | ICD-10-CM | POA: Diagnosis present

## 2024-02-26 DIAGNOSIS — Z79899 Other long term (current) drug therapy: Secondary | ICD-10-CM

## 2024-02-26 DIAGNOSIS — Z8673 Personal history of transient ischemic attack (TIA), and cerebral infarction without residual deficits: Secondary | ICD-10-CM

## 2024-02-26 DIAGNOSIS — E876 Hypokalemia: Secondary | ICD-10-CM | POA: Diagnosis present

## 2024-02-26 DIAGNOSIS — E039 Hypothyroidism, unspecified: Secondary | ICD-10-CM | POA: Diagnosis present

## 2024-02-26 DIAGNOSIS — E861 Hypovolemia: Secondary | ICD-10-CM | POA: Diagnosis not present

## 2024-02-26 DIAGNOSIS — E1141 Type 2 diabetes mellitus with diabetic mononeuropathy: Secondary | ICD-10-CM | POA: Diagnosis present

## 2024-02-26 DIAGNOSIS — R197 Diarrhea, unspecified: Principal | ICD-10-CM | POA: Diagnosis present

## 2024-02-26 DIAGNOSIS — K521 Toxic gastroenteritis and colitis: Principal | ICD-10-CM | POA: Diagnosis present

## 2024-02-26 DIAGNOSIS — N3000 Acute cystitis without hematuria: Secondary | ICD-10-CM

## 2024-02-26 DIAGNOSIS — Z800 Family history of malignant neoplasm of digestive organs: Secondary | ICD-10-CM

## 2024-02-26 DIAGNOSIS — E871 Hypo-osmolality and hyponatremia: Secondary | ICD-10-CM | POA: Diagnosis present

## 2024-02-26 DIAGNOSIS — Z83719 Family history of colon polyps, unspecified: Secondary | ICD-10-CM

## 2024-02-26 DIAGNOSIS — Z833 Family history of diabetes mellitus: Secondary | ICD-10-CM

## 2024-02-26 DIAGNOSIS — Z7969 Long term (current) use of other immunomodulators and immunosuppressants: Secondary | ICD-10-CM

## 2024-02-26 DIAGNOSIS — K219 Gastro-esophageal reflux disease without esophagitis: Secondary | ICD-10-CM | POA: Diagnosis present

## 2024-02-26 DIAGNOSIS — I350 Nonrheumatic aortic (valve) stenosis: Secondary | ICD-10-CM | POA: Diagnosis present

## 2024-02-26 DIAGNOSIS — Y842 Radiological procedure and radiotherapy as the cause of abnormal reaction of the patient, or of later complication, without mention of misadventure at the time of the procedure: Secondary | ICD-10-CM | POA: Diagnosis present

## 2024-02-26 DIAGNOSIS — D72819 Decreased white blood cell count, unspecified: Secondary | ICD-10-CM | POA: Diagnosis present

## 2024-02-26 DIAGNOSIS — F32A Depression, unspecified: Secondary | ICD-10-CM | POA: Diagnosis present

## 2024-02-26 DIAGNOSIS — R0902 Hypoxemia: Secondary | ICD-10-CM | POA: Diagnosis present

## 2024-02-26 DIAGNOSIS — C20 Malignant neoplasm of rectum: Secondary | ICD-10-CM | POA: Diagnosis present

## 2024-02-26 DIAGNOSIS — Z79890 Hormone replacement therapy (postmenopausal): Secondary | ICD-10-CM

## 2024-02-26 DIAGNOSIS — F411 Generalized anxiety disorder: Secondary | ICD-10-CM | POA: Diagnosis present

## 2024-02-26 DIAGNOSIS — T451X5A Adverse effect of antineoplastic and immunosuppressive drugs, initial encounter: Secondary | ICD-10-CM | POA: Diagnosis present

## 2024-02-26 DIAGNOSIS — C9591 Leukemia, unspecified, in remission: Secondary | ICD-10-CM | POA: Diagnosis present

## 2024-02-26 LAB — CBC WITH DIFFERENTIAL/PLATELET
Abs Immature Granulocytes: 0.11 10*3/uL — ABNORMAL HIGH (ref 0.00–0.07)
Basophils Absolute: 0 10*3/uL (ref 0.0–0.1)
Basophils Relative: 1 %
Eosinophils Absolute: 0.1 10*3/uL (ref 0.0–0.5)
Eosinophils Relative: 2 %
HCT: 34.4 % — ABNORMAL LOW (ref 36.0–46.0)
Hemoglobin: 11.5 g/dL — ABNORMAL LOW (ref 12.0–15.0)
Immature Granulocytes: 3 %
Lymphocytes Relative: 8 %
Lymphs Abs: 0.3 10*3/uL — ABNORMAL LOW (ref 0.7–4.0)
MCH: 30.5 pg (ref 26.0–34.0)
MCHC: 33.4 g/dL (ref 30.0–36.0)
MCV: 91.2 fL (ref 80.0–100.0)
Monocytes Absolute: 0.7 10*3/uL (ref 0.1–1.0)
Monocytes Relative: 18 %
Neutro Abs: 2.5 10*3/uL (ref 1.7–7.7)
Neutrophils Relative %: 68 %
Platelets: 162 10*3/uL (ref 150–400)
RBC: 3.77 MIL/uL — ABNORMAL LOW (ref 3.87–5.11)
RDW: 17.6 % — ABNORMAL HIGH (ref 11.5–15.5)
WBC: 3.6 10*3/uL — ABNORMAL LOW (ref 4.0–10.5)
nRBC: 0.8 % — ABNORMAL HIGH (ref 0.0–0.2)

## 2024-02-26 LAB — GASTROINTESTINAL PANEL BY PCR, STOOL (REPLACES STOOL CULTURE)

## 2024-02-26 LAB — COMPREHENSIVE METABOLIC PANEL WITH GFR
ALT: 37 U/L (ref 0–44)
AST: 24 U/L (ref 15–41)
Albumin: 3 g/dL — ABNORMAL LOW (ref 3.5–5.0)
Alkaline Phosphatase: 58 U/L (ref 38–126)
Anion gap: 9 (ref 5–15)
BUN: 9 mg/dL (ref 8–23)
CO2: 25 mmol/L (ref 22–32)
Calcium: 8.1 mg/dL — ABNORMAL LOW (ref 8.9–10.3)
Chloride: 93 mmol/L — ABNORMAL LOW (ref 98–111)
Creatinine, Ser: 0.84 mg/dL (ref 0.44–1.00)
GFR, Estimated: >60 mL/min (ref >60)
Glucose, Bld: 159 mg/dL — ABNORMAL HIGH (ref 70–99)
Potassium: 2.9 mmol/L — ABNORMAL LOW (ref 3.5–5.1)
Sodium: 127 mmol/L — ABNORMAL LOW (ref 135–145)
Total Bilirubin: 0.9 mg/dL (ref 0.0–1.2)
Total Protein: 5.6 g/dL — ABNORMAL LOW (ref 6.5–8.1)

## 2024-02-26 LAB — TROPONIN I (HIGH SENSITIVITY): Troponin I (High Sensitivity): 8 ng/L (ref <18)

## 2024-02-26 LAB — LIPASE, BLOOD: Lipase: 24 U/L (ref 11–51)

## 2024-02-26 LAB — MAGNESIUM: Magnesium: 2.2 mg/dL (ref 1.7–2.4)

## 2024-02-26 MED ORDER — LACTATED RINGERS IV BOLUS
1000.0000 mL | Freq: Once | INTRAVENOUS | Status: AC
  Administered 2024-02-26: 1000 mL via INTRAVENOUS

## 2024-02-26 MED ORDER — IPRATROPIUM-ALBUTEROL 0.5-2.5 (3) MG/3ML IN SOLN
3.0000 mL | Freq: Once | RESPIRATORY_TRACT | Status: AC
  Administered 2024-02-26: 3 mL via RESPIRATORY_TRACT
  Filled 2024-02-26: qty 3

## 2024-02-26 MED ORDER — POTASSIUM CHLORIDE CRYS ER 20 MEQ PO TBCR
40.0000 meq | EXTENDED_RELEASE_TABLET | Freq: Once | ORAL | Status: AC
  Administered 2024-02-26: 40 meq via ORAL
  Filled 2024-02-26: qty 2

## 2024-02-26 MED ORDER — SODIUM CHLORIDE 0.9 % IV SOLN: INTRAVENOUS | Status: AC

## 2024-02-26 MED ORDER — ALUMINUM-PETROLATUM-ZINC (1-2-3 PASTE) 0.027-13.7-10% PASTE: 1.0000 | PASTE | Freq: Once | CUTANEOUS | Status: DC

## 2024-02-26 MED ORDER — SODIUM CHLORIDE 0.9 % IV SOLN
1.0000 g | Freq: Once | INTRAVENOUS | Status: AC
  Administered 2024-02-26: 1 g via INTRAVENOUS
  Filled 2024-02-26: qty 10

## 2024-02-26 MED ORDER — ZINC OXIDE 12.8 % EX OINT
TOPICAL_OINTMENT | Freq: Once | CUTANEOUS | Status: AC
  Filled 2024-02-26: qty 56.7

## 2024-02-26 MED ORDER — IOHEXOL 300 MG/ML  SOLN
100.0000 mL | Freq: Once | INTRAMUSCULAR | Status: AC | PRN
  Administered 2024-02-26: 100 mL via INTRAVENOUS

## 2024-02-26 NOTE — Telephone Encounter (Signed)
 Call from patients daughter Kathee Palm stating patient has had 6+ episodes of diarrhea last night and even more when they first got home from the office yesterday. She states they are giving her the lomotil  2 tablets every 6 hours with 1 imodium  in between as prescribed. Is inquiring what they need to do.  Called Adell Hones back and explained to her that we have advised her son Vallorie Gayer via [employer] to take her to the emergency room for further evaluation. She states she did see those messages and apologizes for the multiple messages from [place] and that he has a lot of anxiety/OCD when it comes to his moms medical and has a hard time waiting for answers. Explained to Adell Hones that we understand and explained what we discussed with Vallorie Gayer via [place] as well. She verbalized understanding and did ask if Dr.Ennever could call Vallorie Gayer tomorrow it would be appreciated. Will advise MD.

## 2024-02-26 NOTE — ED Provider Notes (Signed)
 [facility] AT [facility] Provider Note   CSN: [id number] Arrival date & time: 02/26/24  1743     Patient presents with: Diarrhea   [name] is a 89 y.o. female.    Diarrhea Associated symptoms: abdominal pain   Patient presents for diarrhea and abdominal discomfort.  Medical history includes anemia, HLD, anxiety, DM, HTN, prior GI bleeding, and colon cancer with possible pulmonary metastases.  She is currently undergoing chemo and radiation.  Last treatment was 4 days ago.  She has had persistent diarrhea throughout the past week.  Diarrhea has been nonbloody.  She has had some mild abdominal bloating sensation.  She has taken Lomotil  which has slowed her frequency of diarrhea.  She was last seen in oncology office yesterday.  Plan is for 1 week treatment holiday based on her symptoms of diarrhea, which has caused raw skin around her groin.  She was given IV fluids at oncology office.  Since her return home, her diarrhea frequency has increased.  She has developed abdominal discomfort and burning sensation in her epigastrium and chest which she attributes to reflux.  She has had worsened generalized weakness.  She has been having nausea without vomiting.     Prior to Admission medications   Medication Sig Start Date End Date Taking? Authorizing Provider  acetaminophen  (TYLENOL ) 325 MG tablet Take 2 tablets (650 mg total) by mouth every 6 (six) hours as needed for mild pain (or Fever >/= 101). 01/28/23   Haydee Lipa, MD  ALPRAZolam  (XANAX ) 0.5 MG tablet Take 0.5 mg by mouth 3 (three) times daily as needed for anxiety. For anxiety    [provider]  amiodarone  (PACERONE ) 200 MG tablet Take 0.5 tablets (100 mg total) by mouth daily. 02/19/24   Nahser, Lela Purple, MD  amLODipine  (NORVASC ) 10 MG tablet Take 10 mg by mouth daily. 01/10/24   [provider]  Blood Glucose Monitoring Suppl (TRUE METRIX AIR GLUCOSE METER) w/Device KIT  Check blood sugar q daily [id number] 05/20/19   [provider]  cholecalciferol (VITAMIN D3) 25 MCG (1000 UNIT) tablet Take 1,000 Units by mouth daily.    [provider]  clotrimazole  (LOTRIMIN ) 1 % cream Apply 1 Application topically 2 (two) times daily. 08/20/23   Ivor Mars, MD  cyanocobalamin  1000 MCG tablet Take 1 tablet (1,000 mcg total) by mouth daily. 01/28/23   Haydee Lipa, MD  diphenoxylate -atropine  (LOMOTIL ) 2.5-0.025 MG tablet Take 1 tablet by mouth 4 (four) times daily as needed for diarrhea or loose stools. Take 1-2 tabs PO bid-qid prn; Max: 8 tabs/day; Info: reduce dose when sx controlled; D/C after 10 days if no improvement 02/21/24   Pearlene Bouchard, PA-C  escitalopram  (LEXAPRO ) 5 MG tablet Take 5 mg by mouth at bedtime.    [provider]  famotidine (PEPCID) 20 MG tablet Take 20 mg by mouth daily.    [provider]  feeding supplement (ENSURE ENLIVE / ENSURE PLUS) LIQD Take 237 mLs by mouth 3 (three) times daily with meals. Patient not taking: Reported on 02/25/2024 01/28/23   Haydee Lipa, MD  fluticasone  (FLONASE ) 50 MCG/ACT nasal spray Place 2 sprays into both nostrils daily. 01/28/23   Haydee Lipa, MD  hydrALAZINE  (APRESOLINE ) 25 MG tablet Take 1 tablet (25 mg total) by mouth 2 (two) times daily. 12/14/23   Nahser, Lela Purple, MD  levothyroxine  (SYNTHROID ) 175 MCG tablet Take 175 mcg by mouth every morning. 04/27/23  [provider]  lidocaine -prilocaine  (EMLA ) cream Apply to affected area once 01/28/24   Ennever, Sherryll Donald, MD  loperamide  (IMODIUM  A-D) 2 MG tablet Take 2 mg by mouth 4 (four) times daily as needed for diarrhea or loose stools.    [provider]  loratadine  (CLARITIN ) 10 MG tablet Take 1 tablet (10 mg total) by mouth daily. 01/28/23   Haydee Lipa, MD  nystatin -triamcinolone  ointment (MYCOLOG) Apply 1 Application topically 2 (two) times daily. 02/14/24   Pearlene Bouchard, PA-C  ondansetron   (ZOFRAN ) 8 MG tablet Take 1 tablet (8 mg total) by mouth every 8 (eight) hours as needed for nausea or vomiting. 01/28/24   Ivor Mars, MD  pantoprazole  (PROTONIX ) 20 MG tablet Take 1 tablet (20 mg total) by mouth in the morning. Take 30-60 minutes before breakfast 02/25/24   Sunnie England M, PA-C  polyethylene glycol (MIRALAX  / GLYCOLAX ) 17 g packet Take 17 g by mouth daily as needed for mild constipation. 01/28/23   Haydee Lipa, MD  potassium chloride  SA (K-DUR,KLOR-CON ) 20 MEQ tablet Take 20 mEq by mouth daily.    [provider]  prochlorperazine  (COMPAZINE ) 10 MG tablet Take 1 tablet (10 mg total) by mouth every 6 (six) hours as needed. 09/20/23   Ivor Mars, MD  prochlorperazine  (COMPAZINE ) 10 MG tablet Take 1 tablet (10 mg total) by mouth every 6 (six) hours as needed for nausea or vomiting. 02/18/24   Ivor Mars, MD  triamcinolone  (KENALOG ) 0.025 % ointment Apply 1 Application topically 2 (two) times daily. 02/25/24   Sharla Davis, PA-C  triamterene -hydrochlorothiazide  (MAXZIDE-25) 37.5-25 MG tablet Take 1 tablet by mouth daily. 08/20/23   Ivor Mars, MD  TRUE METRIX BLOOD GLUCOSE TEST test strip  03/12/23   [provider]    Allergies: Aggrenox  [aspirin -dipyridamole  er] and Prednisone     Review of Systems  Gastrointestinal:  Positive for abdominal pain, diarrhea and nausea.  Skin:  Positive for rash.  Neurological:  Positive for weakness (Generalized).  All other systems reviewed and are negative.   Updated Vital Signs BP (!) 107/47 (BP Location: Left Arm)   Pulse 69   Temp 98 F (36.7 C) (Oral)   Resp 18   Ht 5' (1.524 m)   Wt 76 kg   SpO2 90%   BMI 32.72 kg/m   Physical Exam Vitals and nursing note reviewed.  Constitutional:      General: She is not in acute distress.    Appearance: Normal appearance. She is well-developed. She is not ill-appearing, toxic-appearing or diaphoretic.  HENT:     Head: Normocephalic and  atraumatic.     Right Ear: External ear normal.     Left Ear: External ear normal.     Nose: Nose normal.     Mouth/Throat:     Mouth: Mucous membranes are moist.   Eyes:     Extraocular Movements: Extraocular movements intact.     Conjunctiva/sclera: Conjunctivae normal.    Cardiovascular:     Rate and Rhythm: Normal rate and regular rhythm.     Heart sounds: No murmur heard. Pulmonary:     Effort: Pulmonary effort is normal. No respiratory distress.     Breath sounds: Normal breath sounds. No wheezing or rales.  Chest:     Chest wall: No tenderness.  Abdominal:     General: There is no distension.     Palpations: Abdomen is soft.     Tenderness: There is no  abdominal tenderness. There is no guarding or rebound.   Musculoskeletal:        General: No swelling. Normal range of motion.     Cervical back: Normal range of motion and neck supple.   Skin:    General: Skin is warm and dry.     Coloration: Skin is not jaundiced or pale.   Neurological:     General: No focal deficit present.     Mental Status: She is alert and oriented to person, place, and time.   Psychiatric:        Mood and Affect: Mood normal.        Behavior: Behavior normal.     (all labs ordered are listed, but only abnormal results are displayed) Labs Reviewed  COMPREHENSIVE METABOLIC PANEL WITH GFR - Abnormal; Notable for the following components:      Result Value   Sodium 127 (*)    Potassium 2.9 (*)    Chloride 93 (*)    Glucose, Bld 159 (*)    Calcium  8.1 (*)    Total Protein 5.6 (*)    Albumin 3.0 (*)    All other components within normal limits  CBC WITH DIFFERENTIAL/PLATELET - Abnormal; Notable for the following components:   WBC 3.6 (*)    RBC 3.77 (*)    Hemoglobin 11.5 (*)    HCT 34.4 (*)    RDW 17.6 (*)    nRBC 0.8 (*)    Lymphs Abs 0.3 (*)    Abs Immature Granulocytes 0.11 (*)    All other components within normal limits  LIPASE, BLOOD  MAGNESIUM   URINALYSIS, ROUTINE W  REFLEX MICROSCOPIC  URINALYSIS, W/ REFLEX TO CULTURE (INFECTION SUSPECTED)  TROPONIN I (HIGH SENSITIVITY)    EKG: EKG Interpretation Date/Time:  Tuesday Kemani 17 2025 19:04:37 EDT Ventricular Rate:  75 PR Interval:  179 QRS Duration:  88 QT Interval:  400 QTC Calculation: 447 R Axis:   47  Text Interpretation: Sinus rhythm Anteroseptal infarct, old Confirmed by Iva Mariner [phone]) on 02/26/2024 7:07:54 PM  Radiology: CT ABDOMEN PELVIS W CONTRAST Result Date: 02/26/2024 CLINICAL DATA:  Abdominal pain, diarrhea, colon cancer currently undergoing chemotherapy and radiation therapy EXAM: CT ABDOMEN AND PELVIS WITH CONTRAST TECHNIQUE: Multidetector CT imaging of the abdomen and pelvis was performed using the standard protocol following bolus administration of intravenous contrast. RADIATION DOSE REDUCTION: This exam was performed according to the departmental dose-optimization program which includes automated exposure control, adjustment of the mA and/or kV according to patient size and/or use of iterative reconstruction technique. CONTRAST:  OMNIPAQUE  IOHEXOL  300 MG/ML  SOLN COMPARISON:  01/10/2024, 01/17/2023 FINDINGS: Lower chest: No acute pleural or parenchymal lung disease. Hepatobiliary: No focal liver abnormality is seen. Status post cholecystectomy. No biliary dilatation. Pancreas: Unremarkable. No pancreatic ductal dilatation or surrounding inflammatory changes. Spleen: Normal in size without focal abnormality. Adrenals/Urinary Tract: Adrenal glands are unremarkable. Kidneys are normal, without renal calculi, focal lesion, or hydronephrosis. Bladder is unremarkable. Stomach/Bowel: Circumferential rectal wall thickening again noted, compatible with known history of rectal cancer. No bowel obstruction or ileus. Gas fluid levels are seen throughout the colon, compatible with given history of diarrhea. No acute inflammatory changes. Small hiatal hernia. Vascular/Lymphatic: Aortic  atherosclerosis. 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 No pathologic adenopathy. Reproductive: Uterus and bilateral adnexa are unremarkable. Other: No  free fluid or free intraperitoneal gas. No abdominal wall hernia. Musculoskeletal: No acute or destructive bony abnormalities. Reconstructed images demonstrate no additional findings. IMPRESSION: 1. Stable circumferential rectal wall thickening consistent with known history of rectal cancer. 2. Gas fluid levels throughout the colon consistent with diarrhea. No bowel obstruction or ileus. 3. Aortic Atherosclerosis (ICD10-I70.0). Stable high-grade stenoses within the bilateral common iliac arteries, right greater than left. 4.  Aortic Atherosclerosis (ICD10-I70.0). 5. Small hiatal hernia. Electronically Signed   By: Bobbye Burrow M.D.   On: 02/26/2024 21:34     Procedures   Medications Ordered in the ED  0.9 %  sodium chloride  infusion ( Intravenous New Bag/Given 02/26/24 2159)  Zinc Oxide (TRIPLE PASTE) 12.8 % ointment (has no administration in time range)  ipratropium-albuterol  (DUONEB) 0.5-2.5 (3) MG/3ML nebulizer solution 3 mL (has no administration in time range)  lactated ringers  bolus 1,000 mL (1,000 mLs Intravenous Bolus 02/26/24 1851)  potassium chloride  SA (KLOR-CON  M) CR tablet 40 mEq (40 mEq Oral Given 02/26/24 2200)  cefTRIAXone  (ROCEPHIN ) 1 g in sodium chloride  0.9 % 100 mL IVPB (1 g Intravenous New Bag/Given 02/26/24 2200)  iohexol  (OMNIPAQUE ) 300 MG/ML solution 100 mL (100 mLs Intravenous Contrast Given 02/26/24 2110)                                    Medical Decision Making Amount and/or Complexity of Data Reviewed Labs: ordered. Radiology: ordered.  Risk Prescription drug management.   This patient presents to the ED for concern of diarrhea, this involves an  extensive number of treatment options, and is a complaint that carries with it a high risk of complications and morbidity.  The differential diagnosis includes side effects of cancer treatment, enteritis, colitis, dehydration, electrolyte abnormalities   Co morbidities / Chronic conditions that complicate the patient evaluation  anemia, HLD, anxiety, DM, HTN, prior GI bleeding, and colon cancer with possible pulmonary metastases   Additional history obtained:  Additional history obtained from EMR External records from outside source obtained and reviewed including patient's family   Lab Tests:  I Ordered, and personally interpreted labs.  The pertinent results include: Hypokalemia, hyponatremia, hypochloremia, hypocalcemia are present.  Kidney function is preserved.  She has leukopenia and a slightly decreased hemoglobin at baseline, consistent with yesterday's lab work.   Imaging Studies ordered:  I ordered imaging studies including CT of abdomen and pelvis I independently visualized and interpreted imaging which showed stable rectal wall thickening consistent with known cancer; gas/fluid levels throughout colon consistent with diarrhea.  No other acute findings. I agree with the radiologist interpretation   Cardiac Monitoring: / EKG:  The patient was maintained on a cardiac monitor.  I personally viewed and interpreted the cardiac monitored which showed an underlying rhythm of: Sinus rhythm   Problem List / ED Course / Critical interventions / Medication management  Patient presenting for worsening symptoms of diarrhea, abdominal discomfort, nausea.  Currently undergoing treatment for colon cancer.  Seen in the oncology office yesterday.  On arrival in the ED, vital signs are normal.  She is overall well-appearing on exam.  At this time, abdomen is nonpainful and nontender.  Per chart review, lab work from yesterday was notable for leukopenia, slight drop in hemoglobin from  baseline, slight thrombocytopenia, mild worsening of chronic hyponatremia, slight hypokalemia, hypomagnesemia.  C. difficile and GI pathogen panel were negative for detection of pathogens.  Will repeat serum lab  work today.  IV fluids were ordered.  Will obtain CT imaging.  Serum lab work notable for hypokalemia, hyponatremia, leukopenia, and slight decrease in hemoglobin from baseline.  Replacement potassium was ordered.  Additional IV fluids were ordered.  She was able to provide a urine sample.  It was thick and cloudy, suggesting UTI.  Lab actually said that they could not run it due it being too thick.  Nursing is sure that there was no stool contamination.  Will cover her empirically for UTI and send additional urine sample.  CT scan shows stable rectal cancer with gas/fluid levels in colon consistent with diarrhea.  On reassessment, patient is resting comfortably.  She continues to have pain around her right areas of buttocks and groin.  Barrier cream was ordered.  She was placed on oxygen  due to intermittent hypoxia while in the ED.  She does use breathing treatments at home intermittently.  DuoNeb was ordered.  Patient and family were in favor of admission for electrolyte replacement and continued hydration.  Patient to be admitted for further management. I ordered medication including IV fluids for hydration, potassium chloride  for hypokalemia, GI cocktail for reflux, barrier cream for diaper rash, ceftriaxone  for empiric treatment of UTI. Reevaluation of the patient after these medicines showed that the patient improved I have reviewed the patients home medicines and have made adjustments as needed   Social Determinants of Health:  Has access to outpatient care      Final diagnoses:  Diarrhea, unspecified type  Dehydration  Hypokalemia  Generalized weakness    ED Discharge Orders     None          Iva Mariner, MD 02/26/24 2247

## 2024-02-26 NOTE — ED Triage Notes (Addendum)
 Patient BIB [facility] from home. Has had diarrhea for the past week. Has colon cancer and is doing chemo/radiation. Said her stomach feels tight. No vomiting.

## 2024-02-27 ENCOUNTER — Other Ambulatory Visit: Payer: Self-pay

## 2024-02-27 ENCOUNTER — Ambulatory Visit

## 2024-02-27 DIAGNOSIS — C20 Malignant neoplasm of rectum: Secondary | ICD-10-CM | POA: Diagnosis present

## 2024-02-27 DIAGNOSIS — K591 Functional diarrhea: Secondary | ICD-10-CM | POA: Diagnosis not present

## 2024-02-27 DIAGNOSIS — K521 Toxic gastroenteritis and colitis: Secondary | ICD-10-CM | POA: Diagnosis present

## 2024-02-27 DIAGNOSIS — I10 Essential (primary) hypertension: Secondary | ICD-10-CM | POA: Diagnosis present

## 2024-02-27 DIAGNOSIS — E785 Hyperlipidemia, unspecified: Secondary | ICD-10-CM | POA: Diagnosis present

## 2024-02-27 DIAGNOSIS — C9591 Leukemia, unspecified, in remission: Secondary | ICD-10-CM | POA: Diagnosis present

## 2024-02-27 DIAGNOSIS — Z6832 Body mass index (BMI) 32.0-32.9, adult: Secondary | ICD-10-CM | POA: Diagnosis not present

## 2024-02-27 DIAGNOSIS — D72819 Decreased white blood cell count, unspecified: Secondary | ICD-10-CM | POA: Diagnosis present

## 2024-02-27 DIAGNOSIS — Z79890 Hormone replacement therapy (postmenopausal): Secondary | ICD-10-CM | POA: Diagnosis not present

## 2024-02-27 DIAGNOSIS — R531 Weakness: Secondary | ICD-10-CM | POA: Diagnosis not present

## 2024-02-27 DIAGNOSIS — I350 Nonrheumatic aortic (valve) stenosis: Secondary | ICD-10-CM | POA: Diagnosis present

## 2024-02-27 DIAGNOSIS — F32A Depression, unspecified: Secondary | ICD-10-CM | POA: Diagnosis present

## 2024-02-27 DIAGNOSIS — E861 Hypovolemia: Secondary | ICD-10-CM | POA: Diagnosis not present

## 2024-02-27 DIAGNOSIS — Z87891 Personal history of nicotine dependence: Secondary | ICD-10-CM | POA: Diagnosis not present

## 2024-02-27 DIAGNOSIS — E119 Type 2 diabetes mellitus without complications: Secondary | ICD-10-CM | POA: Diagnosis present

## 2024-02-27 DIAGNOSIS — E039 Hypothyroidism, unspecified: Secondary | ICD-10-CM | POA: Diagnosis present

## 2024-02-27 DIAGNOSIS — E66811 Obesity, class 1: Secondary | ICD-10-CM | POA: Diagnosis present

## 2024-02-27 DIAGNOSIS — E876 Hypokalemia: Secondary | ICD-10-CM | POA: Diagnosis present

## 2024-02-27 DIAGNOSIS — E871 Hypo-osmolality and hyponatremia: Secondary | ICD-10-CM | POA: Diagnosis present

## 2024-02-27 DIAGNOSIS — K219 Gastro-esophageal reflux disease without esophagitis: Secondary | ICD-10-CM | POA: Diagnosis present

## 2024-02-27 DIAGNOSIS — Z79899 Other long term (current) drug therapy: Secondary | ICD-10-CM | POA: Diagnosis not present

## 2024-02-27 DIAGNOSIS — F411 Generalized anxiety disorder: Secondary | ICD-10-CM | POA: Diagnosis present

## 2024-02-27 DIAGNOSIS — K529 Noninfective gastroenteritis and colitis, unspecified: Secondary | ICD-10-CM | POA: Diagnosis not present

## 2024-02-27 DIAGNOSIS — E1141 Type 2 diabetes mellitus with diabetic mononeuropathy: Secondary | ICD-10-CM | POA: Diagnosis present

## 2024-02-27 DIAGNOSIS — J439 Emphysema, unspecified: Secondary | ICD-10-CM | POA: Diagnosis present

## 2024-02-27 DIAGNOSIS — R197 Diarrhea, unspecified: Secondary | ICD-10-CM | POA: Diagnosis present

## 2024-02-27 DIAGNOSIS — T451X5A Adverse effect of antineoplastic and immunosuppressive drugs, initial encounter: Secondary | ICD-10-CM | POA: Diagnosis present

## 2024-02-27 DIAGNOSIS — I480 Paroxysmal atrial fibrillation: Secondary | ICD-10-CM | POA: Diagnosis present

## 2024-02-27 DIAGNOSIS — E860 Dehydration: Secondary | ICD-10-CM | POA: Diagnosis present

## 2024-02-27 DIAGNOSIS — Y842 Radiological procedure and radiotherapy as the cause of abnormal reaction of the patient, or of later complication, without mention of misadventure at the time of the procedure: Secondary | ICD-10-CM | POA: Diagnosis present

## 2024-02-27 LAB — BASIC METABOLIC PANEL WITH GFR
Anion gap: 6 (ref 5–15)
BUN: 8 mg/dL (ref 8–23)
CO2: 23 mmol/L (ref 22–32)
Calcium: 7.2 mg/dL — ABNORMAL LOW (ref 8.9–10.3)
Chloride: 99 mmol/L (ref 98–111)
Creatinine, Ser: 0.75 mg/dL (ref 0.44–1.00)
GFR, Estimated: >60 mL/min (ref >60)
Glucose, Bld: 119 mg/dL — ABNORMAL HIGH (ref 70–99)
Potassium: 2.9 mmol/L — ABNORMAL LOW (ref 3.5–5.1)
Sodium: 128 mmol/L — ABNORMAL LOW (ref 135–145)

## 2024-02-27 LAB — CBC
HCT: 32.3 % — ABNORMAL LOW (ref 36.0–46.0)
Hemoglobin: 10.5 g/dL — ABNORMAL LOW (ref 12.0–15.0)
MCH: 30.5 pg (ref 26.0–34.0)
MCHC: 32.5 g/dL (ref 30.0–36.0)
MCV: 93.9 fL (ref 80.0–100.0)
Platelets: 155 10*3/uL (ref 150–400)
RBC: 3.44 MIL/uL — ABNORMAL LOW (ref 3.87–5.11)
RDW: 17.9 % — ABNORMAL HIGH (ref 11.5–15.5)
WBC: 3.4 10*3/uL — ABNORMAL LOW (ref 4.0–10.5)
nRBC: 0.6 % — ABNORMAL HIGH (ref 0.0–0.2)

## 2024-02-27 LAB — TSH: TSH: 7.215 u[IU]/mL — ABNORMAL HIGH (ref 0.350–4.500)

## 2024-02-27 MED ORDER — TRIAMTERENE-HCTZ 37.5-25 MG PO TABS: 1.0000 | ORAL_TABLET | Freq: Every day | ORAL | Status: DC

## 2024-02-27 MED ORDER — LEVOTHYROXINE SODIUM 75 MCG PO TABS
175.0000 ug | ORAL_TABLET | Freq: Every morning | ORAL | Status: DC
  Administered 2024-02-27 – 2024-03-08 (×11): 175 ug via ORAL
  Filled 2024-02-27 (×11): qty 1

## 2024-02-27 MED ORDER — ACETAMINOPHEN 325 MG PO TABS
650.0000 mg | ORAL_TABLET | Freq: Four times a day (QID) | ORAL | Status: DC | PRN
  Administered 2024-02-28 – 2024-03-05 (×7): 650 mg via ORAL
  Filled 2024-02-27 (×7): qty 2

## 2024-02-27 MED ORDER — HYDROCODONE-ACETAMINOPHEN 5-325 MG PO TABS
1.0000 | ORAL_TABLET | Freq: Four times a day (QID) | ORAL | Status: AC | PRN
  Administered 2024-02-27 – 2024-02-28 (×2): 1 via ORAL
  Filled 2024-02-27 (×2): qty 1

## 2024-02-27 MED ORDER — HYDRALAZINE HCL 25 MG PO TABS
25.0000 mg | ORAL_TABLET | Freq: Two times a day (BID) | ORAL | Status: DC
  Administered 2024-02-27 – 2024-03-08 (×20): 25 mg via ORAL
  Filled 2024-02-27 (×20): qty 1

## 2024-02-27 MED ORDER — CHLORHEXIDINE GLUCONATE CLOTH 2 % EX PADS
6.0000 | MEDICATED_PAD | Freq: Every day | CUTANEOUS | Status: DC
  Administered 2024-02-27 – 2024-03-07 (×10): 6 via TOPICAL

## 2024-02-27 MED ORDER — ALPRAZOLAM 0.5 MG PO TABS
0.5000 mg | ORAL_TABLET | Freq: Three times a day (TID) | ORAL | Status: DC | PRN
  Administered 2024-02-27 – 2024-03-07 (×11): 0.5 mg via ORAL
  Filled 2024-02-27 (×11): qty 1

## 2024-02-27 MED ORDER — ENOXAPARIN SODIUM 40 MG/0.4ML IJ SOSY
40.0000 mg | PREFILLED_SYRINGE | INTRAMUSCULAR | Status: DC
  Administered 2024-02-27 – 2024-03-07 (×10): 40 mg via SUBCUTANEOUS
  Filled 2024-02-27 (×11): qty 0.4

## 2024-02-27 MED ORDER — AMLODIPINE BESYLATE 5 MG PO TABS
5.0000 mg | ORAL_TABLET | Freq: Two times a day (BID) | ORAL | Status: DC
  Administered 2024-02-27 – 2024-03-08 (×19): 5 mg via ORAL
  Filled 2024-02-27 (×19): qty 1

## 2024-02-27 MED ORDER — ESCITALOPRAM OXALATE 10 MG PO TABS
5.0000 mg | ORAL_TABLET | Freq: Every day | ORAL | Status: DC
  Administered 2024-02-27 – 2024-03-07 (×11): 5 mg via ORAL
  Filled 2024-02-27 (×11): qty 1

## 2024-02-27 MED ORDER — ONDANSETRON HCL 4 MG/2ML IJ SOLN
4.0000 mg | Freq: Four times a day (QID) | INTRAMUSCULAR | Status: DC | PRN
  Administered 2024-02-27 – 2024-03-05 (×4): 4 mg via INTRAVENOUS
  Filled 2024-02-27 (×4): qty 2

## 2024-02-27 MED ORDER — POTASSIUM CHLORIDE 10 MEQ/50ML IV SOLN
10.0000 meq | INTRAVENOUS | Status: AC
  Administered 2024-02-27 (×6): 10 meq via INTRAVENOUS
  Filled 2024-02-27 (×6): qty 50

## 2024-02-27 MED ORDER — AMIODARONE HCL 200 MG PO TABS
100.0000 mg | ORAL_TABLET | Freq: Every day | ORAL | Status: DC
  Administered 2024-02-27 – 2024-03-08 (×10): 100 mg via ORAL
  Filled 2024-02-27 (×11): qty 1

## 2024-02-27 MED ORDER — OCTREOTIDE ACETATE 50 MCG/ML IJ SOLN
100.0000 ug | Freq: Two times a day (BID) | INTRAMUSCULAR | Status: DC
  Administered 2024-02-27 – 2024-02-28 (×2): 100 ug via SUBCUTANEOUS
  Filled 2024-02-27 (×4): qty 2

## 2024-02-27 MED ORDER — SODIUM CHLORIDE 0.9% FLUSH
10.0000 mL | Freq: Two times a day (BID) | INTRAVENOUS | Status: DC
  Administered 2024-02-27 – 2024-03-08 (×17): 10 mL

## 2024-02-27 MED ORDER — POTASSIUM CHLORIDE CRYS ER 20 MEQ PO TBCR
40.0000 meq | EXTENDED_RELEASE_TABLET | Freq: Three times a day (TID) | ORAL | Status: DC
  Administered 2024-02-27 (×3): 40 meq via ORAL
  Filled 2024-02-27 (×3): qty 2

## 2024-02-27 MED ORDER — SODIUM CHLORIDE 0.9% FLUSH: 10.0000 mL | INTRAVENOUS | Status: DC | PRN

## 2024-02-27 MED ORDER — DIPHENOXYLATE-ATROPINE 2.5-0.025 MG PO TABS: 2.0000 | ORAL_TABLET | Freq: Four times a day (QID) | ORAL | Status: DC | PRN

## 2024-02-27 MED ORDER — ZINC OXIDE 40 % EX OINT
TOPICAL_OINTMENT | CUTANEOUS | Status: DC | PRN
  Administered 2024-02-28: 1 via TOPICAL
  Filled 2024-02-27 (×4): qty 57

## 2024-02-27 MED ORDER — ALUM & MAG HYDROXIDE-SIMETH 200-200-20 MG/5ML PO SUSP
30.0000 mL | Freq: Four times a day (QID) | ORAL | Status: DC | PRN
  Administered 2024-03-04 – 2024-03-07 (×2): 30 mL via ORAL
  Filled 2024-02-27 (×2): qty 30

## 2024-02-27 MED ORDER — PANTOPRAZOLE SODIUM 40 MG PO TBEC
40.0000 mg | DELAYED_RELEASE_TABLET | Freq: Two times a day (BID) | ORAL | Status: DC
  Administered 2024-02-27 – 2024-02-29 (×6): 40 mg via ORAL
  Filled 2024-02-27 (×7): qty 1

## 2024-02-27 MED ORDER — LOPERAMIDE HCL 2 MG PO CAPS
4.0000 mg | ORAL_CAPSULE | Freq: Four times a day (QID) | ORAL | Status: DC
  Administered 2024-02-27: 4 mg via ORAL
  Filled 2024-02-27 (×2): qty 2

## 2024-02-27 MED ORDER — ACETAMINOPHEN 650 MG RE SUPP: 650.0000 mg | Freq: Four times a day (QID) | RECTAL | Status: DC | PRN

## 2024-02-27 MED ORDER — ONDANSETRON HCL 4 MG PO TABS: 4.0000 mg | ORAL_TABLET | Freq: Four times a day (QID) | ORAL | Status: DC | PRN

## 2024-02-27 MED ORDER — PANTOPRAZOLE SODIUM 20 MG PO TBEC
20.0000 mg | DELAYED_RELEASE_TABLET | Freq: Every day | ORAL | Status: DC
  Filled 2024-02-27: qty 1

## 2024-02-27 NOTE — H&P (Signed)
 History and Physical    [name] DOB: 1929-02-26 DOA: 02/26/2024  PCP: Margarete Sharps, MD   Chief Complaint:  diarrhea  HPI: [name] is a 89 y.o. female with medical history significant of aortic stenosis, type 2 diabetes, hypertension, hyperlipidemia, colon cancer who presents emergency department due to diarrhea.  Patient was with oncology for adenocarcinoma of the rectum.  She is currently on pembrolizumab  and Xeloda .  She has been having longstanding issues with diarrhea since last Friday.  She has had numerous episodes.  She was given Lomotil  and her stools slowed significantly.  She had a flexible sigmoidoscopy with Dr. Venice Gillis 2 months ago which showed worsening of her masses.  She has not had biopsies since her diarrhea.   Review of Systems: Review of Systems  Constitutional:  Negative for chills and fever.  HENT: Negative.    Eyes: Negative.   Respiratory: Negative.    Cardiovascular: Negative.   Gastrointestinal:  Positive for abdominal pain and diarrhea.  Genitourinary: Negative.   Musculoskeletal: Negative.   Skin: Negative.   Neurological:  Negative for loss of consciousness.  Endo/Heme/Allergies: Negative.   Psychiatric/Behavioral: Negative.       As per HPI otherwise 10 point review of systems negative.   Allergies  Allergen Reactions   Aggrenox  [Aspirin -Dipyridamole  Er] Other (See Comments)    Severe Bleeding and Stomach Pain   Prednisone  Other (See Comments)    Climbs the walls    Past Medical History:  Diagnosis Date   Anemia    Anxiety    Aortic stenosis 02/26/2023   TTE 01/21/23: EF 60-65, no RWMA, NL RVSF, NL PASP, RVSP 31.5, mild MR, mild AS, mean 10, Vmax 222 cm/s, DI 0.45, RAP 3   Colon polyps    DDD (degenerative disc disease), cervical    Diabetes mellitus    Diabetes mellitus, type 2 (HCC)    Emphysema of lung (HCC)    Hyperlipidemia    Hypertension    Hypothyroidism    Leukemia (HCC) in remission   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   Mucous retention cyst of maxillary sinus    Osteoporosis    Pinched nerve In neck   Plantar fasciitis    Stroke [facility])    Vitamin D deficiency     Past Surgical History:  Procedure Laterality Date   APPENDECTOMY     BIOPSY  01/19/2023   Procedure: BIOPSY;  Surgeon: Nannette Babe, MD;  Location: [facility];  Service: Gastroenterology;;   BIOPSY  03/21/2023   Procedure: BIOPSY;  Surgeon: Lajuan Pila, MD;  Location: [facility];  Service: Gastroenterology;;   BIOPSY  03/22/2023   Procedure: BIOPSY;  Surgeon: Lajuan Pila, MD;  Location: [facility] ENDOSCOPY;  Service: Gastroenterology;;   CHOLECYSTECTOMY     2004   COLONOSCOPY N/A 02/24/2013   Procedure: COLONOSCOPY;  Surgeon: Asencion Blacksmith, MD;  Location: [facility] ENDOSCOPY;  Service: Endoscopy;  Laterality: N/A;   COLONOSCOPY W/ POLYPECTOMY     COLONOSCOPY WITH PROPOFOL  N/A 12/03/2017   Procedure: COLONOSCOPY WITH PROPOFOL ;  Surgeon: Janel Medford, MD;  Location: [facility] ENDOSCOPY;  Service: Gastroenterology;  Laterality: N/A;   ESOPHAGOGASTRODUODENOSCOPY     ESOPHAGOGASTRODUODENOSCOPY N/A 02/24/2013   Procedure: ESOPHAGOGASTRODUODENOSCOPY (EGD);  Surgeon: Asencion Blacksmith, MD;  Location: [facility];  Service: Endoscopy;  Laterality: N/A;   ESOPHAGOGASTRODUODENOSCOPY (EGD) WITH PROPOFOL  N/A 12/03/2017   Procedure: ESOPHAGOGASTRODUODENOSCOPY (EGD) WITH PROPOFOL ;  Surgeon: Janel Medford, MD;  Location: [facility] ENDOSCOPY;  Service: Gastroenterology;  Laterality: N/A;  ESOPHAGOGASTRODUODENOSCOPY (EGD) WITH PROPOFOL  N/A 03/21/2023   Procedure: ESOPHAGOGASTRODUODENOSCOPY (EGD) WITH PROPOFOL ;  Surgeon: Lajuan Pila, MD;  Location: [facility] ENDOSCOPY;  Service: Gastroenterology;  Laterality: N/A;   EYE SURGERY     bilateral catracts   FLEXIBLE SIGMOIDOSCOPY N/A 01/19/2023   Procedure: FLEXIBLE SIGMOIDOSCOPY;  Surgeon: Nannette Babe, MD;  Location: [facility] ENDOSCOPY;  Service: Gastroenterology;  Laterality: N/A;   FLEXIBLE SIGMOIDOSCOPY N/A 03/22/2023    Procedure: FLEXIBLE SIGMOIDOSCOPY;  Surgeon: Lajuan Pila, MD;  Location: [facility] ENDOSCOPY;  Service: Gastroenterology;  Laterality: N/A;   HOT HEMOSTASIS N/A 03/21/2023   Procedure: HOT HEMOSTASIS (ARGON PLASMA COAGULATION/BICAP);  Surgeon: Lajuan Pila, MD;  Location: [facility];  Service: Gastroenterology;  Laterality: N/A;   IR IMAGING GUIDED PORT INSERTION  03/26/2023     reports that she quit smoking about 30 years ago. Her smoking use included cigarettes. She started smoking about 74 years ago. She has never used smokeless tobacco. She reports that she does not drink alcohol  and does not use drugs.  Family History  Problem Relation Age of Onset   Colon polyps Son    Colon cancer Son    Diabetes Other    Esophageal cancer Neg Hx    Stomach cancer Neg Hx    Rectal cancer Neg Hx     Prior to Admission medications   Medication Sig Start Date End Date Taking? Authorizing Provider  acetaminophen  (TYLENOL ) 325 MG tablet Take 2 tablets (650 mg total) by mouth every 6 (six) hours as needed for mild pain (or Fever >/= 101). 01/28/23   Haydee Lipa, MD  ALPRAZolam  (XANAX ) 0.5 MG tablet Take 0.5 mg by mouth 3 (three) times daily as needed for anxiety. For anxiety    [provider]  amiodarone  (PACERONE ) 200 MG tablet Take 0.5 tablets (100 mg total) by mouth daily. 02/19/24   Nahser, Lela Purple, MD  amLODipine  (NORVASC ) 10 MG tablet Take 10 mg by mouth daily. 01/10/24   [provider]  Blood Glucose Monitoring Suppl (TRUE METRIX AIR GLUCOSE METER) w/Device KIT Check blood sugar q daily Dx-E11.9 05/20/19   [provider]  cholecalciferol (VITAMIN D3) 25 MCG (1000 UNIT) tablet Take 1,000 Units by mouth daily.    [provider]  clotrimazole  (LOTRIMIN ) 1 % cream Apply 1 Application topically 2 (two) times daily. 08/20/23   Ivor Mars, MD  cyanocobalamin  1000 MCG tablet Take 1 tablet (1,000 mcg total) by mouth daily. 01/28/23   Haydee Lipa, MD   diphenoxylate -atropine  (LOMOTIL ) 2.5-0.025 MG tablet Take 1 tablet by mouth 4 (four) times daily as needed for diarrhea or loose stools. Take 1-2 tabs PO bid-qid prn; Max: 8 tabs/day; Info: reduce dose when sx controlled; D/C after 10 days if no improvement 02/21/24   Pearlene Bouchard, PA-C  escitalopram  (LEXAPRO ) 5 MG tablet Take 5 mg by mouth at bedtime.    [provider]  famotidine (PEPCID) 20 MG tablet Take 20 mg by mouth daily.    [provider]  feeding supplement (ENSURE ENLIVE / ENSURE PLUS) LIQD Take 237 mLs by mouth 3 (three) times daily with meals. Patient not taking: Reported on 02/25/2024 01/28/23   Haydee Lipa, MD  fluticasone  (FLONASE ) 50 MCG/ACT nasal spray Place 2 sprays into both nostrils daily. 01/28/23   Haydee Lipa, MD  hydrALAZINE  (APRESOLINE ) 25 MG tablet Take 1 tablet (25 mg total) by mouth 2 (two) times daily. 12/14/23   Nahser, Lela Purple, MD  levothyroxine  (SYNTHROID ) 175 MCG  tablet Take 175 mcg by mouth every morning. 04/27/23   [provider]  lidocaine -prilocaine  (EMLA ) cream Apply to affected area once 01/28/24   Ennever, Sherryll Donald, MD  loperamide  (IMODIUM  A-D) 2 MG tablet Take 2 mg by mouth 4 (four) times daily as needed for diarrhea or loose stools.    [provider]  loratadine  (CLARITIN ) 10 MG tablet Take 1 tablet (10 mg total) by mouth daily. 01/28/23   Haydee Lipa, MD  nystatin -triamcinolone  ointment (MYCOLOG) Apply 1 Application topically 2 (two) times daily. 02/14/24   Pearlene Bouchard, PA-C  ondansetron  (ZOFRAN ) 8 MG tablet Take 1 tablet (8 mg total) by mouth every 8 (eight) hours as needed for nausea or vomiting. 01/28/24   Ivor Mars, MD  pantoprazole  (PROTONIX ) 20 MG tablet Take 1 tablet (20 mg total) by mouth in the morning. Take 30-60 minutes before breakfast 02/25/24   Sunnie England M, PA-C  polyethylene glycol (MIRALAX  / GLYCOLAX ) 17 g packet Take 17 g by mouth daily as needed for mild constipation.  01/28/23   Haydee Lipa, MD  potassium chloride  SA (K-DUR,KLOR-CON ) 20 MEQ tablet Take 20 mEq by mouth daily.    [provider]  prochlorperazine  (COMPAZINE ) 10 MG tablet Take 1 tablet (10 mg total) by mouth every 6 (six) hours as needed. 09/20/23   Ivor Mars, MD  prochlorperazine  (COMPAZINE ) 10 MG tablet Take 1 tablet (10 mg total) by mouth every 6 (six) hours as needed for nausea or vomiting. 02/18/24   Ivor Mars, MD  triamcinolone  (KENALOG ) 0.025 % ointment Apply 1 Application topically 2 (two) times daily. 02/25/24   Sharla Davis, PA-C  triamterene -hydrochlorothiazide  (MAXZIDE-25) 37.5-25 MG tablet Take 1 tablet by mouth daily. 08/20/23   Ivor Mars, MD  TRUE METRIX BLOOD GLUCOSE TEST test strip  03/12/23   [provider]    Physical Exam: Vitals:   02/26/24 1751 02/26/24 1752 02/26/24 2144  BP: (!) 143/61  (!) 107/47  Pulse: 77  69  Resp: 18  18  Temp: 98.3 F (36.8 C)  98 F (36.7 C)  TempSrc: Oral  Oral  SpO2: 91%  90%  Weight:  76 kg   Height:  5' (1.524 m)    Physical Exam Vitals reviewed.  Constitutional:      Appearance: She is normal weight.  HENT:     Head: Normocephalic.     Nose: Nose normal.     Mouth/Throat:     Mouth: Mucous membranes are moist.     Pharynx: Oropharynx is clear.   Eyes:     Conjunctiva/sclera: Conjunctivae normal.     Pupils: Pupils are equal, round, and reactive to light.    Cardiovascular:     Rate and Rhythm: Normal rate and regular rhythm.     Pulses: Normal pulses.     Heart sounds: Normal heart sounds.  Pulmonary:     Effort: Pulmonary effort is normal.     Breath sounds: Normal breath sounds.  Abdominal:     General: Abdomen is flat.     Palpations: Abdomen is soft.   Musculoskeletal:        General: Normal range of motion.     Cervical back: Normal range of motion.   Skin:    General: Skin is warm.     Capillary Refill: Capillary refill takes less than 2 seconds.    Neurological:     General: No focal deficit present.     Mental  Status: She is alert.   Psychiatric:        Mood and Affect: Mood normal.        Labs on Admission: I have personally reviewed the patients's labs and imaging studies.  Assessment/Plan Principal Problem:   Diarrhea   # Rectal adenocarcinoma # Acute on chronic diarrhea - Patient has had chronic diarrhea that has been managed with Lomotil  in outpatient setting.  Patient is having 10+ bowel movements per day prompting presentation to the ER for further assessment this is less likely to be infectious given negative stool studies yesterday.  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 Inpatient status is judged to be reasonable and necessary in order to provide the required intensity of service to ensure the patient's safety. The patient's presenting symptoms, physical exam findings, and initial radiographic and laboratory data in the context of their chronic comorbidities is felt to place them at high risk for further clinical deterioration. Furthermore, it is not anticipated that the patient will be medically stable for discharge from the hospital within 2 midnights of admission.   * I certify that at the point of admission it is my clinical judgment that the patient will require inpatient hospital care spanning beyond 2 midnights from the point of admission due to high intensity of service, high risk for further deterioration and high  frequency of surveillance required.Myrl Askew MD [facility] If 7PM-7AM, please contact night-coverage [email or web address]  02/27/2024, 1:26 AM

## 2024-02-27 NOTE — Progress Notes (Signed)
 Courtesy note. No billing-  Patient is seen and examined today morning. She is admitted for multiple episodes of diarrhea. She has h/o rectal adenocarcinoma on radiation therapy and chemo. She does have loose stools but this time more severe.   Stool studies, C diff negative. Imaging unremarkable. Continue lomotil  as needed, gentle IV fluids, monitor electrolytes.  Once her symptoms improve plan to send her home. Continue supportive care.

## 2024-02-27 NOTE — Progress Notes (Signed)
 OT Cancellation Note  Patient Details Name: [name] MRN: [id number] DOB: 08-26-1929   Cancelled Treatment:    Reason Eval/Treat Not Completed: Medical issues which prohibited therapy Patient declining reporting discomfort and increased loose Bms. OT to continue to follow and check back on 6/19.  Wynette Heckler, MS Acute Rehabilitation Department Office# [phone]  02/27/2024, 3:10 PM

## 2024-02-27 NOTE — Plan of Care (Signed)

## 2024-02-27 NOTE — Consult Note (Signed)
 [name] is well-known to me.  She is a very nice 89 year old white female.  She has recurrent rectal cancer.  She currently is undergoing radiation therapy with infusional 5-FU.  I think she is at about 3 weeks of treatment so far.  She is really done well.  However, recently, she began to have diarrhea.  This has been quite profuse.  She ultimately had to be admitted.  She came to the hospital on 02/26/2024.  When she was admitted, her sodium 127.  Potassium 2.9.  BUN 9 creatinine 0.84.  Calcium  8.1 with an albumin of 3.0.  Her white count 3.6.  Hemoglobin 11.5.  Platelet count 262,000.  She had a CT scan.  This was of the abdomen and pelvis.  This showed some circumferential rectal wall thickening.  There is no obstruction.  There is no perforation.  She has stool cultures that have been taken.  She has had no bleeding.  There has been no fever.  She has had no nausea or vomiting.  She has had no rashes.  Is been no leg swelling.   Her vital signs show temperature 97.9.  Pulse 62.  Blood pressure 115/52.  Her head and neck exam shows no scleral icterus.  There is no ocular or oral lesions.  She has no palpable cervical or supraclavicular lymph nodes.  Lungs are clear bilaterally.  Cardiac exam regular rate and rhythm.  She has no murmurs, rubs or bruits.  Abdomen slightly distended.  She has decent bowel sounds.  There is no guarding or rebound tenderness.  There is no palpable liver or spleen tip.  Extremities shows no clubbing, cyanosis or edema.  Neurological exam shows no focal neurological deficits.   I should really not be surprised as she is having these side effect now.  She has done very well.  I have believe that treatment is going to work.  We are going to have to hold on chemotherapy at this point in my opinion.,  I am not how much more she would be able to tolerate.    We will have to see what the stool cultures look like.  Hopefully this is not C. difficile.  She is on IV fluid.   We will have to monitor her blood counts closely.  We will have to see what her potassium is.  Will have to see what her magnesium  is.  I think her diet will have to be very bland right now.  Again, I would probably hold on her radiation therapy for right now.  She really needs to get this diarrhea under control.  I know that she will have incredible care from everybody upon 6 E.   Christina Cal, MD  Psalm 6:2

## 2024-02-28 ENCOUNTER — Encounter: Payer: Self-pay | Admitting: Hematology & Oncology

## 2024-02-28 ENCOUNTER — Inpatient Hospital Stay

## 2024-02-28 ENCOUNTER — Inpatient Hospital Stay: Admitting: Medical Oncology

## 2024-02-28 ENCOUNTER — Ambulatory Visit

## 2024-02-28 DIAGNOSIS — E860 Dehydration: Secondary | ICD-10-CM

## 2024-02-28 DIAGNOSIS — K529 Noninfective gastroenteritis and colitis, unspecified: Secondary | ICD-10-CM | POA: Diagnosis not present

## 2024-02-28 DIAGNOSIS — E871 Hypo-osmolality and hyponatremia: Secondary | ICD-10-CM | POA: Diagnosis not present

## 2024-02-28 DIAGNOSIS — E876 Hypokalemia: Secondary | ICD-10-CM

## 2024-02-28 LAB — CBC WITH DIFFERENTIAL/PLATELET
Abs Immature Granulocytes: 0.08 10*3/uL — ABNORMAL HIGH (ref 0.00–0.07)
Basophils Absolute: 0 10*3/uL (ref 0.0–0.1)
Basophils Relative: 1 %
Eosinophils Absolute: 0.2 10*3/uL (ref 0.0–0.5)
Eosinophils Relative: 5 %
HCT: 34.4 % — ABNORMAL LOW (ref 36.0–46.0)
Hemoglobin: 10.8 g/dL — ABNORMAL LOW (ref 12.0–15.0)
Immature Granulocytes: 2 %
Lymphocytes Relative: 15 %
Lymphs Abs: 0.6 10*3/uL — ABNORMAL LOW (ref 0.7–4.0)
MCH: 31.1 pg (ref 26.0–34.0)
MCHC: 31.4 g/dL (ref 30.0–36.0)
MCV: 99.1 fL (ref 80.0–100.0)
Monocytes Absolute: 0.9 10*3/uL (ref 0.1–1.0)
Monocytes Relative: 23 %
Neutro Abs: 2 10*3/uL (ref 1.7–7.7)
Neutrophils Relative %: 54 %
Platelets: 167 10*3/uL (ref 150–400)
RBC: 3.47 MIL/uL — ABNORMAL LOW (ref 3.87–5.11)
RDW: 19 % — ABNORMAL HIGH (ref 11.5–15.5)
WBC: 3.7 10*3/uL — ABNORMAL LOW (ref 4.0–10.5)
nRBC: 0 % (ref 0.0–0.2)

## 2024-02-28 LAB — URINALYSIS, W/ REFLEX TO CULTURE (INFECTION SUSPECTED)
Bacteria, UA: NONE SEEN
Bilirubin Urine: NEGATIVE
Glucose, UA: NEGATIVE mg/dL
Ketones, ur: NEGATIVE mg/dL
Nitrite: NEGATIVE
Protein, ur: 30 mg/dL — AB
Specific Gravity, Urine: 1.023 (ref 1.005–1.030)
pH: 5 (ref 5.0–8.0)

## 2024-02-28 LAB — MAGNESIUM: Magnesium: 2.1 mg/dL (ref 1.7–2.4)

## 2024-02-28 LAB — COMPREHENSIVE METABOLIC PANEL WITH GFR
ALT: 25 U/L (ref 0–44)
AST: 16 U/L (ref 15–41)
Albumin: 2.6 g/dL — ABNORMAL LOW (ref 3.5–5.0)
Alkaline Phosphatase: 53 U/L (ref 38–126)
Anion gap: 8 (ref 5–15)
BUN: 7 mg/dL — ABNORMAL LOW (ref 8–23)
CO2: 21 mmol/L — ABNORMAL LOW (ref 22–32)
Calcium: 8.2 mg/dL — ABNORMAL LOW (ref 8.9–10.3)
Chloride: 105 mmol/L (ref 98–111)
Creatinine, Ser: 0.92 mg/dL (ref 0.44–1.00)
GFR, Estimated: 58 mL/min — ABNORMAL LOW (ref >60)
Glucose, Bld: 115 mg/dL — ABNORMAL HIGH (ref 70–99)
Potassium: 4.6 mmol/L (ref 3.5–5.1)
Sodium: 134 mmol/L — ABNORMAL LOW (ref 135–145)
Total Bilirubin: 0.6 mg/dL (ref 0.0–1.2)
Total Protein: 4.9 g/dL — ABNORMAL LOW (ref 6.5–8.1)

## 2024-02-28 LAB — T4: T4, Total: 8.3 ug/dL (ref 4.5–12.0)

## 2024-02-28 MED ORDER — OCTREOTIDE ACETATE 100 MCG/ML IJ SOLN
100.0000 ug | Freq: Two times a day (BID) | INTRAMUSCULAR | Status: DC
  Administered 2024-02-28 – 2024-03-04 (×10): 100 ug via SUBCUTANEOUS
  Filled 2024-02-28 (×10): qty 1

## 2024-02-28 MED ORDER — LOPERAMIDE HCL 2 MG PO CAPS
2.0000 mg | ORAL_CAPSULE | ORAL | Status: DC | PRN
  Administered 2024-02-28 – 2024-02-29 (×5): 2 mg via ORAL
  Filled 2024-02-28 (×5): qty 1

## 2024-02-28 MED ORDER — MORPHINE SULFATE (PF) 4 MG/ML IV SOLN
2.0000 mg | INTRAVENOUS | Status: DC | PRN
  Administered 2024-03-01: 2 mg via INTRAVENOUS
  Filled 2024-02-28: qty 1

## 2024-02-28 NOTE — Evaluation (Signed)
 Occupational Therapy Evaluation Patient Details Name: [name] MRN: [id number] DOB: 07-21-1929 Today's Date: 02/28/2024   History of Present Illness   89 y.o. female who presents emergency department due to diarrhea. Pt with medical history significant of aortic stenosis, type 2 diabetes, hypertension, hyperlipidemia, rectal cancer, CVA, DDD.     Clinical Impressions PTA, patient was living with son and DIL completing all BADL's and some IADL's and amb without AD independently. Family assists with higher level IADL's and all transportation. DIL reports also that son currently is undergoing treatment as well. Currently, patient presents with deficits outlined below (see OT Problem List for details) most significantly pain decreased activity tolerance and balance and generalized muscle weakness impacting BADL's and functional mobility. Patient requires continued Acute hospital level OT services to progress function and safety to allow for discharge. Patient will benefit from continued inpatient follow up therapy, <3 hours/day.       If plan is discharge home, recommend the following:   A lot of help with walking and/or transfers;A lot of help with bathing/dressing/bathroom;Assistance with cooking/housework;Direct supervision/assist for medications management;Direct supervision/assist for financial management;Assist for transportation;Help with stairs or ramp for entrance     Functional Status Assessment   Patient has had a recent decline in their functional status and demonstrates the ability to make significant improvements in function in a reasonable and predictable amount of time.     Equipment Recommendations   None recommended by OT;Other (comment) (TBA post rehab venue)      Precautions/Restrictions   Precautions Precautions: Fall Recall of Precautions/Restrictions: Intact Precaution/Restrictions Comments: no falls in past 6 months Restrictions Weight Bearing  Restrictions Per Provider Order: No     Mobility Bed Mobility Overal bed mobility: Needs Assistance Bed Mobility: Supine to Sit     Supine to sit: Mod assist     General bed mobility comments: assist to raise trunk and pivot hips to edge of bed    Transfers Overall transfer level: Needs assistance Equipment used: Rolling walker (2 wheels) Transfers: Sit to/from Stand, Bed to chair/wheelchair/BSC Sit to Stand: Mod assist     Step pivot transfers: Min assist, +2 safety/equipment     General transfer comment: assist to power up, VCs hand placement; sit to stand x 3. Step pivot from bed to bedside commode, then to recliner. Pt able to stand for ~60 seconds, then fatigued.      Balance Overall balance assessment: Needs assistance Sitting-balance support: Feet supported, No upper extremity supported Sitting balance-Leahy Scale: Fair     Standing balance support: Bilateral upper extremity supported, During functional activity, Reliant on assistive device for balance Standing balance-Leahy Scale: Poor                             ADL either performed or assessed with clinical judgement   ADL Overall ADL's : Needs assistance/impaired Eating/Feeding: Set up;Sitting   Grooming: Wash/dry face;Wash/dry hands;Set up;Sitting   Upper Body Bathing: Minimal assistance;Sitting   Lower Body Bathing: Maximal assistance;Sit to/from stand   Upper Body Dressing : Minimal assistance;Sitting   Lower Body Dressing: Maximal assistance;Sit to/from stand   Toilet Transfer: Moderate assistance;BSC/3in1;Rolling walker (2 wheels);+2 for physical assistance;+2 for safety/equipment   Toileting- Clothing Manipulation and Hygiene: Maximal assistance;Sit to/from stand       Functional mobility during ADLs: Minimal assistance;Moderate assistance;+2 for physical assistance;+2 for safety/equipment;Rolling walker (2 wheels) General ADL Comments: poor activity tolerance impacts BADL's  Vision Baseline Vision/History: 0 No visual deficits Ability to See in Adequate Light: 0 Adequate Patient Visual Report: No change from baseline Vision Assessment?: No apparent visual deficits            Pertinent Vitals/Pain Pain Assessment Pain Assessment: Faces Faces Pain Scale: Hurts whole lot Pain Location: perineal area Pain Descriptors / Indicators: Discomfort, Grimacing, Guarding Pain Intervention(s): Limited activity within patient's tolerance, Monitored during session, Repositioned, Relaxation     Extremity/Trunk Assessment Upper Extremity Assessment Upper Extremity Assessment: Right hand dominant;Generalized weakness   Lower Extremity Assessment Lower Extremity Assessment: Generalized weakness   Cervical / Trunk Assessment Cervical / Trunk Assessment: Kyphotic   Communication Communication Communication: No apparent difficulties   Cognition Arousal: Alert Behavior During Therapy: WFL for tasks assessed/performed Cognition: No apparent impairments             OT - Cognition Comments: DIL bedside comfirms                 Following commands: Intact       Cueing  General Comments   Cueing Techniques: Verbal cues  barrier cream applied post BM due to pain and rawness at rectal region, placed on air cushion for pressure releif in recliner           Home Living Family/patient expects to be discharged to:: Private residence Living Arrangements: Children Available Help at Discharge: Family;Available 24 hours/day Type of Home: House Home Access: Stairs to enter [employer] of Steps: 3 Entrance Stairs-Rails: Right;Left;Can reach both Home Layout: One level     Bathroom Shower/Tub: Chief Strategy Officer: Standard     Home Equipment: Agricultural consultant (2 wheels);Wheelchair - manual;Shower seat;Hand held shower head;Toilet riser   Additional Comments: lives with son and daughter in Social worker; has reacher      Prior  Functioning/Environment Prior Level of Function : Independent/Modified Independent             Mobility Comments: walks without AD, no falls in past 6 months ADLs Comments: She was modifed independent to independent with ADLs, cooking, and cooking. She does not drive.    OT Problem List: Decreased strength;Decreased activity tolerance;Impaired balance (sitting and/or standing);Decreased knowledge of use of DME or AE;Decreased knowledge of precautions;Obesity;Pain   OT Treatment/Interventions: Self-care/ADL training;Therapeutic exercise;Neuromuscular education;Energy conservation;DME and/or AE instruction;Therapeutic activities;Patient/family education;Balance training      OT Goals(Current goals can be found in the care plan section)   Acute Rehab OT Goals Patient Stated Goal: to feel better OT Goal Formulation: With patient/family Time For Goal Achievement: 03/13/24 Potential to Achieve Goals: Good ADL Goals Pt Will Perform Upper Body Bathing: with supervision;sitting Pt Will Perform Lower Body Bathing: with min assist;sitting/lateral leans Pt Will Perform Upper Body Dressing: with set-up;sitting Pt Will Perform Lower Body Dressing: with mod assist;sit to/from stand Pt Will Transfer to Toilet: with contact guard assist;bedside commode;stand pivot transfer Pt Will Perform Toileting - Clothing Manipulation and hygiene: with min assist;sitting/lateral leans Pt/caregiver will Perform Home Exercise Program: Increased strength;With Supervision;With written HEP provided;Both right and left upper extremity   OT Frequency:  Min 2X/week    Co-evaluation PT/OT/SLP Co-Evaluation/Treatment: Yes Reason for Co-Treatment: To address functional/ADL transfers PT goals addressed during session: Mobility/safety with mobility;Balance;Proper use of DME        AM-PAC OT 6 Clicks Daily Activity     Outcome Measure Help from another person eating meals?: A Little Help from another person  taking care of personal grooming?: A Little Help from  another person toileting, which includes using toliet, bedpan, or urinal?: A Lot Help from another person bathing (including washing, rinsing, drying)?: A Little Help from another person to put on and taking off regular upper body clothing?: A Little Help from another person to put on and taking off regular lower body clothing?: A Lot 6 Click Score: 16   End of Session Equipment Utilized During Treatment: Gait belt Nurse Communication: Mobility status;Other (comment) (BM on commode)  Activity Tolerance: Patient limited by pain;Patient limited by fatigue Patient left: in chair;with call bell/phone within reach;with chair alarm set;with family/visitor present  OT Visit Diagnosis: Unsteadiness on feet (R26.81);Other abnormalities of gait and mobility (R26.89);Muscle weakness (generalized) (M62.81);Pain                Time: 1140-1205 OT Time Calculation (min): 25 min Charges:  OT General Charges $OT Visit: 1 Visit OT Evaluation $OT Eval Low Complexity: 1 Low OT Treatments $Self Care/Home Management : 8-22 mins  Ashliegh Parekh OT/L Acute Rehabilitation Department  [phone]  02/28/2024, 2:10 PM

## 2024-02-28 NOTE — Plan of Care (Signed)
  Problem: Clinical Measurements: Goal: Ability to maintain clinical measurements within normal limits will improve Outcome: Progressing Goal: Diagnostic test results will improve Outcome: Progressing Goal: Respiratory complications will improve Outcome: Progressing Goal: Cardiovascular complication will be avoided Outcome: Progressing   Problem: Clinical Measurements: Goal: Ability to maintain clinical measurements within normal limits will improve Outcome: Progressing Goal: Diagnostic test results will improve Outcome: Progressing Goal: Respiratory complications will improve Outcome: Progressing Goal: Cardiovascular complication will be avoided Outcome: Progressing   Problem: Elimination: Goal: Will not experience complications related to bowel motility Outcome: Progressing Goal: Will not experience complications related to urinary retention Outcome: Progressing   Problem: Pain Managment: Goal: General experience of comfort will improve and/or be controlled Outcome: Progressing   Problem: Skin Integrity: Goal: Risk for impaired skin integrity will decrease Outcome: Progressing

## 2024-02-28 NOTE — Progress Notes (Signed)
   02/27/24 2141  Vitals  Temp 97.9 F (36.6 C)  Temp Source Oral  BP (!) 110/46  MAP (mmHg) (!) 64  BP Location Left Arm  BP Method Automatic  Patient Position (if appropriate) Lying  Pulse Rate 63  Pulse Rate Source Monitor  Resp 14  MEWS COLOR  MEWS Score Color Green  Oxygen  Therapy  SpO2 98 %  O2 Device Nasal Cannula  MEWS Score  MEWS Temp 0  MEWS Systolic 0  MEWS Pulse 0  MEWS RR 0  MEWS LOC 0  MEWS Score 0  Provider Notification  Provider Name/Title Lorre Rosin NP  Date Provider Notified 02/27/24  Time Provider Notified 2256  Method of Notification  (secure chat)  Notification Reason Other (Comment) (BP 110/46)  Provider response Other (Comment) (hold BP meds)  Date of Provider Response 02/27/24  Time of Provider Response 2304    Provider made aware of Pt BP and map. Advised to hold Pt scheduled BP medications

## 2024-02-28 NOTE — Progress Notes (Signed)
 It seems like the diarrhea might be a little bit better.  She started on Sandostatin.  Hopefully this will help slow it up.  There are no labs back yet.  She ate better.  She is having no problem with fever.  She is having no problems with nausea or vomiting.  She is having no bleeding.  There is no cough.  I think that her abdomen does not appear to be as distended.  Her vital signs show temperature of 98.1.  Pulse 60.  Blood pressure 160/43.  Her head and neck exam shows no ocular or oral lesions.  Oral mucosa is moist her.  She has no adenopathy in the neck.  Lungs are clear bilaterally.  She has good air movement bilaterally.  Cardiac exam regular rate and rhythm.  She has no murmurs.  Abdomen is soft.  Abdomen might be a little bit distended.  It is not tense.  Bowel sounds are not as active.  She has no guarding or rebound tenderness.  Extremities shows no clubbing, cyanosis or edema.  Again, when I see what her labs look like.  I know she got potassium yesterday.  There would not surprise me if she needed more potassium.  I am glad that she is eating.  I think the Sandostatin will work.  Thankfully, the panel for stool pathogens was all negative.  The C. difficile was also negative.  I still think is going to take a good 4 or 5 days for her to finally get better so she could go home.  Her radiation is on hold right now.  I probably would hold it until next week.  I really think we can hold on chemotherapy.  We can always readdress this depending on her performance status.  I note that she is getting great care from everybody up on 6 E.   Rayleen Cal, MD  Philippians 4:13

## 2024-02-28 NOTE — Progress Notes (Signed)
 Progress Note   Patient: [name] OZH:[id number] DOB: 1928/12/07 DOA: 02/26/2024     1 DOS: the patient was seen and examined on 02/28/2024   Brief hospital course:  Keleigh R Seevers is a 89 y.o. female with medical history significant of aortic stenosis, type 2 diabetes, hypertension, hyperlipidemia, colon cancer who presents emergency department due to diarrhea.  Patient was with oncology for adenocarcinoma of the rectum.  She is currently on pembrolizumab  and Xeloda .  She has been having longstanding issues with diarrhea since last Friday.  She has had numerous episodes.  She was given Lomotil  and her stools slowed significantly.  She had a flexible sigmoidoscopy with Dr. Venice Gillis 2 months ago which showed worsening of her masses.  She has not had biopsies since her diarrhea. Patient is admitted to TRH service for further management and evaluation of severe diarrhea.  Assessment and Plan: Severe diarrhea Dehydration Rectal adenocarcinoma She has chronic diarrhea has been managed with lomotil . Infectious etiology, c diff studies negative. Continue supportive care.  Dr. Maria Shiner follow up appreciated. Started her on Octreotide. Continue PRN imodium .  Hypokalemia- Due to GI losses. S/p repletion. Will recheck daily electrolytes and replete accordingly.  Hyponatremia- Due to hypovolemia due to diarrhea. Improved with gentle IV fluids.  Anxiety and depression- Continue xanax , lexapro .  Paroxysmal Afib- continue amiodarone .  Hypothyroidism - on Levothyroxine .  GERD -On PPI.   Out of bed to chair. Incentive spirometry. Nursing supportive care. Fall, aspiration precautions. Diet:  Diet Orders (From admission, onward)     Start     Ordered   02/27/24 0658  DIET SOFT Room service appropriate? Yes; Fluid consistency: Thin  Diet effective now       Question Answer Comment  Room service appropriate? Yes   Fluid consistency: Thin      02/27/24 0657           DVT prophylaxis:  enoxaparin  (LOVENOX ) injection 40 mg Start: 02/27/24 1000 SCDs Start: 02/27/24 0125  Level of care: Telemetry   Code Status: Full Code  Subjective: Patient is seen and examined today morning. She is lying in bed, has pain over rectal area due to excoriated skin. Eating fair. Advised to work with PT.  Physical Exam: Vitals:   02/28/24 0615 02/28/24 0853 02/28/24 0925 02/28/24 1334  BP: (!) 116/43 (!) 105/46  (!) 135/58  Pulse: 60 (!) 56  67  Resp: 14   20  Temp: 98.1 F (36.7 C)   (!) 97.3 F (36.3 C)  TempSrc: Oral   Oral  SpO2: 97%  99% 97%  Weight:      Height:        General - Elderly Caucasian ill female, distress due to pain. HEENT - PERRLA, EOMI, atraumatic head, non tender sinuses. Lung - Clear, basal rales, rhonchi, wheezes. Heart - S1, S2 heard, no murmurs, rubs, trace pedal edema. Abdomen - Soft, non tender, bowel sounds good Neuro - Alert, awake and oriented x 3, non focal exam. Skin - Warm and dry.  Data Reviewed:      Latest Ref Rng & Units 02/28/2024    6:50 AM 02/27/2024    5:00 AM 02/26/2024    6:45 PM  CBC  WBC 4.0 - 10.5 K/uL 3.7  3.4  3.6   Hemoglobin 12.0 - 15.0 g/dL 62.9  52.8  41.3   Hematocrit 36.0 - 46.0 % 34.4  32.3  34.4   Platelets 150 - 400 K/uL 167  155  162  Latest Ref Rng & Units 02/28/2024    6:50 AM 02/27/2024    5:00 AM 02/26/2024    6:45 PM  BMP  Glucose 70 - 99 mg/dL 161  096  045   BUN 8 - 23 mg/dL 7  8  9    Creatinine 0.44 - 1.00 mg/dL 4.09  8.11  9.14   Sodium 135 - 145 mmol/L 134  128  127   Potassium 3.5 - 5.1 mmol/L 4.6  2.9  2.9   Chloride 98 - 111 mmol/L 105  99  93   CO2 22 - 32 mmol/L 21  23  25    Calcium  8.9 - 10.3 mg/dL 8.2  7.2  8.1    CT ABDOMEN PELVIS W CONTRAST Result Date: 02/26/2024 CLINICAL DATA:  Abdominal pain, diarrhea, colon cancer currently undergoing chemotherapy and radiation therapy EXAM: CT ABDOMEN AND PELVIS WITH CONTRAST TECHNIQUE: Multidetector CT imaging of the abdomen and pelvis was  performed using the standard protocol following bolus administration of intravenous contrast. RADIATION DOSE REDUCTION: This exam was performed according to the departmental dose-optimization program which includes automated exposure control, adjustment of the mA and/or kV according to patient size and/or use of iterative reconstruction technique. CONTRAST:  OMNIPAQUE  IOHEXOL  300 MG/ML  SOLN COMPARISON:  01/10/2024, 01/17/2023 FINDINGS: Lower chest: No acute pleural or parenchymal lung disease. Hepatobiliary: No focal liver abnormality is seen. Status post cholecystectomy. No biliary dilatation. Pancreas: Unremarkable. No pancreatic ductal dilatation or surrounding inflammatory changes. Spleen: Normal in size without focal abnormality. Adrenals/Urinary Tract: Adrenal glands are unremarkable. Kidneys are normal, without renal calculi, focal lesion, or hydronephrosis. Bladder is unremarkable. Stomach/Bowel: Circumferential rectal wall thickening again noted, compatible with known history of rectal cancer. No bowel obstruction or ileus. Gas fluid levels are seen throughout the colon, compatible with given history of diarrhea. No acute inflammatory changes. Small hiatal hernia. Vascular/Lymphatic: Aortic atherosclerosis. 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 No pathologic adenopathy. Reproductive: Uterus and bilateral adnexa are unremarkable. Other: No free fluid or free intraperitoneal gas. No abdominal wall hernia. Musculoskeletal: No acute or destructive bony abnormalities. Reconstructed images demonstrate no additional findings. IMPRESSION: 1. Stable circumferential rectal wall thickening consistent with known history of rectal cancer. 2. Gas fluid levels throughout the colon consistent with diarrhea. No bowel obstruction or ileus. 3.  Aortic Atherosclerosis (ICD10-I70.0). Stable high-grade stenoses within the bilateral common iliac arteries, right greater than left. 4.  Aortic Atherosclerosis (ICD10-I70.0). 5. Small hiatal hernia. Electronically Signed   By: Bobbye Burrow M.D.   On: 02/26/2024 21:34    Family Communication: Discussed with patient, daughter at bedside. They understand and agree. All questions answered.  Disposition: Status is: Inpatient Remains inpatient appropriate because: Severe diarrhea, fluid electrolyte management, pain control.  Planned Discharge Destination: Skilled nursing facility/ STR     Time spent: 40 minutes  Author: Aisha Hove, MD 02/28/2024 1:41 PM Secure chat 7am to 7pm For on call review [email or web address].

## 2024-02-28 NOTE — Consult Note (Addendum)
 WOC Nurse Consult Note: Reason for Consult: Requested to assess a MASD on perineum/bottom/groin. Wound type: MASD due diarrhea Pressure Injury POA: NA Wound bed: redness, no skin breakdown. Diarrhea episodes while consult. Discussed with the nurse about the alternatives, including the flexiseal. Obs: She is using Sandotatin. Drainage (amount, consistency, odor) NONE. Periwound: redness intact Dressing procedure/placement/frequency: Clean the area with warm water and soup. Apply Zinc butt cream twice a day or PRN after every diarrhea episode.  WOC team will not plan to follow further.  Please reconsult if further assistance is needed. Thank-you,  Rachel Budds BSN, RN, [employer], WOC  (Pager: [phone])

## 2024-02-28 NOTE — Evaluation (Addendum)
 Physical Therapy Evaluation Patient Details Name: [name] MRN: [id number] DOB: June 17, 1929 Today's Date: 02/28/2024  History of Present Illness  89 y.o. female who presents emergency department due to diarrhea. Pt with medical history significant of aortic stenosis, type 2 diabetes, hypertension, hyperlipidemia, rectal cancer, CVA, DDD.  Clinical Impression  Pt admitted with above diagnosis. Mod assist for supine to sit, mod assist for sit to stand, min assist of 2 to pivot to bedside commode with RW then to recliner. Pt's standing tolerance is limited to ~60 seconds 2* pain and fatigue. At baseline pt ambulates without an assistive device. Pt lives with son and daughter in law, pt's son is primary caregiver and has stage 4 cancer and may not be able to provide needed level of care for pt. Patient will benefit from continued inpatient follow up therapy, <3 hours/day Pt currently with functional limitations due to the deficits listed below (see PT Problem List). Pt will benefit from acute skilled PT to increase their independence and safety with mobility to allow discharge.           If plan is discharge home, recommend the following: A lot of help with walking and/or transfers;A lot of help with bathing/dressing/bathroom;Assistance with cooking/housework;Assist for transportation;Help with stairs or ramp for entrance   Can travel by private vehicle   No    Equipment Recommendations None recommended by PT  Recommendations for Other Services       Functional Status Assessment Patient has had a recent decline in their functional status and demonstrates the ability to make significant improvements in function in a reasonable and predictable amount of time.     Precautions / Restrictions Precautions Precautions: Fall Recall of Precautions/Restrictions: Intact Precaution/Restrictions Comments: no falls in past 6 months Restrictions Weight Bearing Restrictions Per Provider Order: No       Mobility  Bed Mobility Overal bed mobility: Needs Assistance Bed Mobility: Supine to Sit     Supine to sit: Mod assist     General bed mobility comments: assist to raise trunk and pivot hips to edge of bed    Transfers Overall transfer level: Needs assistance Equipment used: Rolling walker (2 wheels) Transfers: Sit to/from Stand, Bed to chair/wheelchair/BSC Sit to Stand: Mod assist   Step pivot transfers: Min assist, +2 safety/equipment       General transfer comment: assist to power up, VCs hand placement; sit to stand x 3. Step pivot from bed to bedside commode, then to recliner. Pt able to stand for ~60 seconds, then fatigued.    Ambulation/Gait                  Stairs            Wheelchair Mobility     Tilt Bed    Modified Rankin (Stroke Patients Only)       Balance Overall balance assessment: Needs assistance Sitting-balance support: Feet supported, No upper extremity supported Sitting balance-Leahy Scale: Fair     Standing balance support: Bilateral upper extremity supported, During functional activity, Reliant on assistive device for balance Standing balance-Leahy Scale: Poor                               Pertinent Vitals/Pain Pain Assessment Pain Assessment: Faces Faces Pain Scale: Hurts whole lot Pain Location: perineal area Pain Descriptors / Indicators: Discomfort, Grimacing, Guarding Pain Intervention(s): Limited activity within patient's tolerance, Monitored during session, Repositioned, Patient requesting pain  meds-RN notified    Home Living Family/patient expects to be discharged to:: Private residence Living Arrangements: Children Available Help at Discharge: Family;Available 24 hours/day Type of Home: House Home Access: Stairs to enter Entrance Stairs-Rails: Right;Left;Can reach both Entrance Stairs-Number of Steps: 3   Home Layout: One level Home Equipment: Agricultural consultant (2 wheels);Wheelchair -  manual;Shower seat;Hand held shower head;Toilet riser Additional Comments: lives with son and daughter in Social worker; has reacher    Prior Function Prior Level of Function : Independent/Modified Independent             Mobility Comments: walks without AD, no falls in past 6 months ADLs Comments: She was modifed independent to independent with ADLs, cooking, and cooking. She does not drive.     Extremity/Trunk Assessment   Upper Extremity Assessment Upper Extremity Assessment: Defer to OT evaluation    Lower Extremity Assessment Lower Extremity Assessment: Generalized weakness    Cervical / Trunk Assessment Cervical / Trunk Assessment: Kyphotic  Communication   Communication Communication: No apparent difficulties    Cognition Arousal: Alert Behavior During Therapy: WFL for tasks assessed/performed   PT - Cognitive impairments: No apparent impairments                         Following commands: Intact       Cueing       General Comments      Exercises     Assessment/Plan    PT Assessment Patient needs continued PT services  PT Problem List Decreased mobility;Decreased activity tolerance;Decreased balance;Pain       PT Treatment Interventions Gait training;Therapeutic activities;Therapeutic exercise;Functional mobility training;Patient/family education    PT Goals (Current goals can be found in the Care Plan section)  Acute Rehab PT Goals Patient Stated Goal: get strong enough to go home PT Goal Formulation: With patient/family Time For Goal Achievement: 03/13/24 Potential to Achieve Goals: Fair    Frequency Min 3X/week     Co-evaluation               AM-PAC PT 6 Clicks Mobility  Outcome Measure Help needed turning from your back to your side while in a flat bed without using bedrails?: A Little Help needed moving from lying on your back to sitting on the side of a flat bed without using bedrails?: A Lot Help needed moving to and  from a bed to a chair (including a wheelchair)?: A Lot Help needed standing up from a chair using your arms (e.g., wheelchair or bedside chair)?: A Lot Help needed to walk in hospital room?: A Lot Help needed climbing 3-5 steps with a railing? : Total 6 Click Score: 12    End of Session Equipment Utilized During Treatment: Gait belt Activity Tolerance: Patient limited by fatigue;Patient limited by pain Patient left: in chair;with call bell/phone within reach;with chair alarm set;with family/visitor present Nurse Communication: Mobility status PT Visit Diagnosis: Muscle weakness (generalized) (M62.81);Difficulty in walking, not elsewhere classified (R26.2);Other abnormalities of gait and mobility (R26.89)    Time: 1140-1205 PT Time Calculation (min) (ACUTE ONLY): 25 min   Charges:   PT Evaluation $PT Eval Moderate Complexity: 1 Mod   PT General Charges $$ ACUTE PT VISIT: 1 Visit         Daymon Evans PT 02/28/2024  Acute Rehabilitation Services  Office [phone]

## 2024-02-29 ENCOUNTER — Inpatient Hospital Stay (HOSPITAL_COMMUNITY)

## 2024-02-29 ENCOUNTER — Inpatient Hospital Stay

## 2024-02-29 ENCOUNTER — Ambulatory Visit

## 2024-02-29 DIAGNOSIS — E876 Hypokalemia: Secondary | ICD-10-CM | POA: Diagnosis not present

## 2024-02-29 DIAGNOSIS — K529 Noninfective gastroenteritis and colitis, unspecified: Secondary | ICD-10-CM | POA: Diagnosis not present

## 2024-02-29 DIAGNOSIS — E871 Hypo-osmolality and hyponatremia: Secondary | ICD-10-CM | POA: Diagnosis not present

## 2024-02-29 DIAGNOSIS — E860 Dehydration: Secondary | ICD-10-CM | POA: Diagnosis not present

## 2024-02-29 DIAGNOSIS — C20 Malignant neoplasm of rectum: Secondary | ICD-10-CM | POA: Diagnosis not present

## 2024-02-29 LAB — COMPREHENSIVE METABOLIC PANEL WITH GFR
ALT: 22 U/L (ref 0–44)
AST: 16 U/L (ref 15–41)
Albumin: 2.5 g/dL — ABNORMAL LOW (ref 3.5–5.0)
Alkaline Phosphatase: 54 U/L (ref 38–126)
Anion gap: 8 (ref 5–15)
BUN: 7 mg/dL — ABNORMAL LOW (ref 8–23)
CO2: 21 mmol/L — ABNORMAL LOW (ref 22–32)
Calcium: 8 mg/dL — ABNORMAL LOW (ref 8.9–10.3)
Chloride: 108 mmol/L (ref 98–111)
Creatinine, Ser: 0.7 mg/dL (ref 0.44–1.00)
GFR, Estimated: >60 mL/min (ref >60)
Glucose, Bld: 124 mg/dL — ABNORMAL HIGH (ref 70–99)
Potassium: 3.6 mmol/L (ref 3.5–5.1)
Sodium: 137 mmol/L (ref 135–145)
Total Bilirubin: 0.6 mg/dL (ref 0.0–1.2)
Total Protein: 4.9 g/dL — ABNORMAL LOW (ref 6.5–8.1)

## 2024-02-29 LAB — CBC WITH DIFFERENTIAL/PLATELET
Abs Immature Granulocytes: 0.13 10*3/uL — ABNORMAL HIGH (ref 0.00–0.07)
Basophils Absolute: 0 10*3/uL (ref 0.0–0.1)
Basophils Relative: 1 %
Eosinophils Absolute: 0.1 10*3/uL (ref 0.0–0.5)
Eosinophils Relative: 3 %
HCT: 34.1 % — ABNORMAL LOW (ref 36.0–46.0)
Hemoglobin: 10.8 g/dL — ABNORMAL LOW (ref 12.0–15.0)
Immature Granulocytes: 3 %
Lymphocytes Relative: 11 %
Lymphs Abs: 0.5 10*3/uL — ABNORMAL LOW (ref 0.7–4.0)
MCH: 30.6 pg (ref 26.0–34.0)
MCHC: 31.7 g/dL (ref 30.0–36.0)
MCV: 96.6 fL (ref 80.0–100.0)
Monocytes Absolute: 0.8 10*3/uL (ref 0.1–1.0)
Monocytes Relative: 18 %
Neutro Abs: 2.6 10*3/uL (ref 1.7–7.7)
Neutrophils Relative %: 64 %
Platelets: 172 10*3/uL (ref 150–400)
RBC: 3.53 MIL/uL — ABNORMAL LOW (ref 3.87–5.11)
RDW: 19 % — ABNORMAL HIGH (ref 11.5–15.5)
WBC: 4.2 10*3/uL (ref 4.0–10.5)
nRBC: 0 % (ref 0.0–0.2)

## 2024-02-29 LAB — MAGNESIUM: Magnesium: 1.9 mg/dL (ref 1.7–2.4)

## 2024-02-29 MED ORDER — DIPHENOXYLATE-ATROPINE 2.5-0.025 MG PO TABS
1.0000 | ORAL_TABLET | Freq: Four times a day (QID) | ORAL | Status: DC | PRN
  Administered 2024-02-29 – 2024-03-01 (×2): 1 via ORAL
  Filled 2024-02-29 (×3): qty 1

## 2024-02-29 MED ORDER — OXYCODONE-ACETAMINOPHEN 5-325 MG PO TABS
1.0000 | ORAL_TABLET | Freq: Four times a day (QID) | ORAL | Status: DC | PRN
  Administered 2024-02-29 – 2024-03-04 (×11): 1 via ORAL
  Filled 2024-02-29 (×11): qty 1

## 2024-02-29 NOTE — Plan of Care (Signed)
  Problem: Education: Goal: Knowledge of General Education information will improve Description: Including pain rating scale, medication(s)/side effects and non-pharmacologic comfort measures Outcome: Progressing   Problem: Health Behavior/Discharge Planning: Goal: Ability to manage health-related needs will improve Outcome: Progressing   Problem: Clinical Measurements: Goal: Ability to maintain clinical measurements within normal limits will improve Outcome: Progressing Goal: Will remain free from infection Outcome: Progressing Goal: Diagnostic test results will improve Outcome: Progressing Goal: Respiratory complications will improve Outcome: Progressing Goal: Cardiovascular complication will be avoided Outcome: Progressing   Problem: Activity: Goal: Risk for activity intolerance will decrease Outcome: Progressing   Problem: Nutrition: Goal: Adequate nutrition will be maintained Outcome: Progressing   Problem: Coping: Goal: Level of anxiety will decrease Outcome: Progressing   Problem: Elimination: Goal: Will not experience complications related to urinary retention Outcome: Progressing   Problem: Pain Managment: Goal: General experience of comfort will improve and/or be controlled Outcome: Progressing   Problem: Safety: Goal: Ability to remain free from injury will improve Outcome: Progressing   Problem: Elimination: Goal: Will not experience complications related to bowel motility Outcome: Not Progressing   Problem: Skin Integrity: Goal: Risk for impaired skin integrity will decrease Outcome: Not Progressing

## 2024-02-29 NOTE — Progress Notes (Signed)
   02/29/24 1547  TOC Brief Assessment  Insurance and Status Reviewed  Patient has primary care physician Yes Margarete Sharps MD)  Home environment has been reviewed Home alone  Prior level of function: Independent  Prior/Current Home Services No current home services  Social Drivers of Health Review SDOH reviewed no interventions necessary  Readmission risk has been reviewed Yes  Transition of care needs transition of care needs identified, TOC will continue to follow   TOC following for possible placement. MD has documented anticipated that she will be in the hospital for another 4 or 5 days at least. TOC will follow for disposition recommendation when close to being medically stable.

## 2024-02-29 NOTE — Plan of Care (Signed)

## 2024-02-29 NOTE — Progress Notes (Signed)
 She is still having diarrhea.  It is hard to say if this is improving.  I think this may take quite a while.  She is having no nausea or vomiting.  She does seem to be eating relatively well.  She still have a lot of rectal pain.  She is having some rectal breakdown from the diarrhea from radiation.  I think that the Wound Care nurse is helping.  She has not had any labs back yet today.  She is out of bed sitting in the chair.  I really do not think she is going to need any Rehab.  I think that she can get some physical therapy in the hospital while she is on the floor.  I think once the diarrhea stops, that she will begin to improve nicely.  Radiation therapy is on hold.  She has had no bleeding.  Her abdomen does look a little bit distended.  I may want to get an abdominal x-ray on her.  Her vital signs show temperature 98.1.  Pulse 66.  Blood pressure 127/43.  Her lungs sound clear bilaterally.  Cardiac exam regular rate and rhythm.  She has a 1/6 systolic ejection murmur.  Her abdomen is somewhat distended.  Bowel sounds are present.  She has no fluid wave.  There is no palpable abdominal mass.  Extremities shows some slight edema in the lower legs.  Neurological exam is nonfocal.   Ms. Duffus has a diarrhea from the radiation and infusional 5-FU.  We have her on somatostatin.  I would like to believe that the diarrhea will let up soon.  We will have to see what her labs look like.  Again anticipate that she will be in the hospital for another 4 or 5 days at least.  She does need to have physical therapy in the hospital.  Again I do not see that she needs to be sent to Rehab.  I do appreciate the great care that she is getting from everybody on 6 E.   Rayleen Cal, MD  Hebrews 12:12

## 2024-02-29 NOTE — Progress Notes (Signed)
 Physical Therapy Treatment Patient Details Name: [name] MRN: [id number] DOB: January 06, 1929 Today's Date: 02/29/2024   History of Present Illness 89 y.o. female who presents emergency department due to diarrhea. Pt with medical history significant of aortic stenosis, type 2 diabetes, hypertension, hyperlipidemia, rectal cancer, CVA, DDD.    PT Comments  Pt reports she attempted to sit in the recliner earlier today but couldn't tolerate it 2* buttock pain. Pt now c/o lower abdominal pain and is soiled with liquid BM. Pt declined getting out of bed for clean up 2* pain. She agreed to bed level strengthening exercises.  RN notified pt pt request for pain medication and for clean up assistance.    If plan is discharge home, recommend the following: A lot of help with walking and/or transfers;A lot of help with bathing/dressing/bathroom;Assistance with cooking/housework;Assist for transportation;Help with stairs or ramp for entrance   Can travel by private vehicle     No  Equipment Recommendations  None recommended by PT    Recommendations for Other Services       Precautions / Restrictions Precautions Precautions: Fall Recall of Precautions/Restrictions: Intact Precaution/Restrictions Comments: no falls in past 6 months Restrictions Weight Bearing Restrictions Per Provider Order: No     Mobility  Bed Mobility               General bed mobility comments: pt declined mobilitly 2* lower abdominal pain, poor tolerance of sitting earlier today, and is currently soiled with diarrhea    Transfers                        Ambulation/Gait                   Stairs             Wheelchair Mobility     Tilt Bed    Modified Rankin (Stroke Patients Only)       Balance                                            Communication Communication Communication: No apparent difficulties  Cognition Arousal: Alert Behavior During  Therapy: WFL for tasks assessed/performed   PT - Cognitive impairments: No apparent impairments                         Following commands: Intact      Cueing    Exercises General Exercises - Upper Extremity Shoulder Flexion: AROM, Both, 10 reps, Supine General Exercises - Lower Extremity Ankle Circles/Pumps: AROM, Both, 15 reps, Supine Short Arc Quad: AROM, Both, 10 reps, Supine Heel Slides: AROM, Both, 10 reps, Supine Hip ABduction/ADduction: AROM, Both, 10 reps, Supine Shoulder Exercises Shoulder Flexion: AROM, Both, 10 reps, Supine    General Comments        Pertinent Vitals/Pain Pain Assessment Faces Pain Scale: Hurts even more Pain Location: lower abdomen Pain Descriptors / Indicators: Grimacing, Discomfort Pain Intervention(s): Limited activity within patient's tolerance, Monitored during session, Patient requesting pain meds-RN notified    Home Living                          Prior Function            PT Goals (current goals can now be found in the care  plan section) Acute Rehab PT Goals Patient Stated Goal: get strong enough to go home PT Goal Formulation: With patient/family Time For Goal Achievement: 03/13/24 Potential to Achieve Goals: Fair Progress towards PT goals: Not progressing toward goals - comment (pain limiting progression)    Frequency    Min 3X/week      PT Plan      Co-evaluation PT/OT/SLP Co-Evaluation/Treatment: Yes Reason for Co-Treatment: To address functional/ADL transfers PT goals addressed during session: Mobility/safety with mobility;Balance;Proper use of DME        AM-PAC PT 6 Clicks Mobility   Outcome Measure  Help needed turning from your back to your side while in a flat bed without using bedrails?: A Little Help needed moving from lying on your back to sitting on the side of a flat bed without using bedrails?: A Lot Help needed moving to and from a bed to a chair (including a wheelchair)?:  A Lot Help needed standing up from a chair using your arms (e.g., wheelchair or bedside chair)?: A Lot Help needed to walk in hospital room?: A Lot Help needed climbing 3-5 steps with a railing? : Total 6 Click Score: 12    End of Session Equipment Utilized During Treatment: Gait belt Activity Tolerance: Patient limited by fatigue;Patient limited by pain Patient left: in bed;with bed alarm set;with call bell/phone within reach Nurse Communication: Mobility status PT Visit Diagnosis: Muscle weakness (generalized) (M62.81);Difficulty in walking, not elsewhere classified (R26.2);Other abnormalities of gait and mobility (R26.89)     Time: 0272-5366 PT Time Calculation (min) (ACUTE ONLY): 8 min  Charges:    $Therapeutic Exercise: 8-22 mins PT General Charges $$ ACUTE PT VISIT: 1 Visit                     Daymon Evans PT 02/29/2024  Acute Rehabilitation Services  Office [phone]

## 2024-02-29 NOTE — Progress Notes (Signed)
 Progress Note   Patient: [name] JYN:[id number] DOB: 1928/09/12 DOA: 02/26/2024     2 DOS: the patient was seen and examined on 02/29/2024   Brief hospital course:  [name] is a 89 y.o. female with medical history significant of aortic stenosis, type 2 diabetes, hypertension, hyperlipidemia, colon cancer who presents emergency department due to diarrhea.  Patient was with oncology for adenocarcinoma of the rectum.  She is currently on pembrolizumab  and Xeloda .  She has been having longstanding issues with diarrhea since last Friday.  She has had numerous episodes.  She was given Lomotil  and her stools slowed significantly.  She had a flexible sigmoidoscopy with Dr. Venice Gillis 2 months ago which showed worsening of her masses.  She has not had biopsies since her diarrhea. Patient is admitted to TRH service for further management and evaluation of severe diarrhea.  Assessment and Plan: Severe diarrhea Dehydration Rectal adenocarcinoma She has chronic diarrhea has been managed with lomotil . Infectious etiology, c diff studies negative. Continue supportive care.  Dr. Maria Shiner follow up appreciated. Continue Octreotide. Abdominal xray shows low grade obstruction vs ileus. Continue skin care with barrier cream due to her excoriations on bottom due to her diarrhea. Nursing supportive care.  Hypokalemia- Due to GI losses. S/p repletion. Will recheck daily electrolytes and replete accordingly.  Hyponatremia- Due to hypovolemia due to diarrhea. Improved with gentle IV fluids.  Anxiety and depression- Continue xanax , lexapro .  Paroxysmal Afib- continue amiodarone .  Hypothyroidism - on Levothyroxine .  GERD -On PPI.   Out of bed to chair. Incentive spirometry. Nursing supportive care. Fall, aspiration precautions. Diet:  Diet Orders (From admission, onward)     Start     Ordered   02/27/24 0658  DIET SOFT Room service appropriate? Yes; Fluid consistency: Thin  Diet effective now        Question Answer Comment  Room service appropriate? Yes   Fluid consistency: Thin      02/27/24 0657           DVT prophylaxis: enoxaparin  (LOVENOX ) injection 40 mg Start: 02/27/24 1000 SCDs Start: 02/27/24 0125  Level of care: Telemetry   Code Status: Full Code  Subjective: Patient is seen and examined today morning. She is sitting on commode. Had several episodes of loose stools per daughter. Eating fair. RN at bedside.  Physical Exam: Vitals:   02/28/24 2242 02/28/24 2242 02/29/24 0521 02/29/24 0947  BP: (!) 145/53 (!) 145/53 (!) 127/43 (!) 130/51  Pulse:  64 66   Resp:   14   Temp:   98.1 F (36.7 C)   TempSrc:   Oral   SpO2:   98%   Weight:      Height:        General - Elderly Caucasian ill female, no acute distress. HEENT - PERRLA, EOMI, atraumatic head, non tender sinuses. Lung - Clear, basal rales, rhonchi, wheezes. Heart - S1, S2 heard, no murmurs, rubs, trace pedal edema. Abdomen - Soft, non tender, bowel sounds good Neuro - Alert, awake and oriented x 3, non focal exam. Skin - Warm and dry.  Data Reviewed:      Latest Ref Rng & Units 02/29/2024    6:43 AM 02/28/2024    6:50 AM 02/27/2024    5:00 AM  CBC  WBC 4.0 - 10.5 K/uL 4.2  3.7  3.4   Hemoglobin 12.0 - 15.0 g/dL 86.5  78.4  69.6   Hematocrit 36.0 - 46.0 % 34.1  34.4  32.3  Platelets 150 - 400 K/uL 172  167  155       Latest Ref Rng & Units 02/29/2024    6:43 AM 02/28/2024    6:50 AM 02/27/2024    5:00 AM  BMP  Glucose 70 - 99 mg/dL 161  096  045   BUN 8 - 23 mg/dL 7  7  8    Creatinine 0.44 - 1.00 mg/dL 4.09  8.11  9.14   Sodium 135 - 145 mmol/L 137  134  128   Potassium 3.5 - 5.1 mmol/L 3.6  4.6  2.9   Chloride 98 - 111 mmol/L 108  105  99   CO2 22 - 32 mmol/L 21  21  23    Calcium  8.9 - 10.3 mg/dL 8.0  8.2  7.2    DG Abd 2 Views Result Date: 02/29/2024 CLINICAL DATA:  Diarrhea. EXAM: ABDOMEN - 2 VIEW COMPARISON:  CT 02/26/2024 FINDINGS: 2 supine views demonstrate gas within  normal caliber colon. Gas-filled proximal small bowel loops of up to 4.5 cm. Cholecystectomy. No gross free intraperitoneal air. IMPRESSION: Gas-filled small bowel loops of up to 4.5 cm may represent ileus or low-grade partial small bowel obstruction. Electronically Signed   By: Lore Rode M.D.   On: 02/29/2024 12:13    Family Communication: Discussed with patient, daughter at bedside. They understand and agree. All questions answered.  Disposition: Status is: Inpatient Remains inpatient appropriate because: Severe diarrhea, fluid electrolyte management, pain control.  Planned Discharge Destination: Skilled nursing facility/ STR     Time spent: 39 minutes  Author: Aisha Hove, MD 02/29/2024 2:11 PM Secure chat 7am to 7pm For on call review [email or web address].

## 2024-03-01 DIAGNOSIS — E876 Hypokalemia: Secondary | ICD-10-CM | POA: Diagnosis not present

## 2024-03-01 DIAGNOSIS — E860 Dehydration: Secondary | ICD-10-CM | POA: Diagnosis not present

## 2024-03-01 DIAGNOSIS — K529 Noninfective gastroenteritis and colitis, unspecified: Secondary | ICD-10-CM | POA: Diagnosis not present

## 2024-03-01 DIAGNOSIS — E871 Hypo-osmolality and hyponatremia: Secondary | ICD-10-CM | POA: Diagnosis not present

## 2024-03-01 DIAGNOSIS — C20 Malignant neoplasm of rectum: Secondary | ICD-10-CM | POA: Diagnosis not present

## 2024-03-01 LAB — STOOL CULTURE REFLEX - CMPCXR

## 2024-03-01 LAB — COMPREHENSIVE METABOLIC PANEL WITH GFR
ALT: 19 U/L (ref 0–44)
AST: 13 U/L — ABNORMAL LOW (ref 15–41)
Albumin: 2.6 g/dL — ABNORMAL LOW (ref 3.5–5.0)
Alkaline Phosphatase: 60 U/L (ref 38–126)
Anion gap: 10 (ref 5–15)
BUN: 7 mg/dL — ABNORMAL LOW (ref 8–23)
CO2: 23 mmol/L (ref 22–32)
Calcium: 8.2 mg/dL — ABNORMAL LOW (ref 8.9–10.3)
Chloride: 105 mmol/L (ref 98–111)
Creatinine, Ser: 0.79 mg/dL (ref 0.44–1.00)
GFR, Estimated: >60 mL/min (ref >60)
Glucose, Bld: 132 mg/dL — ABNORMAL HIGH (ref 70–99)
Potassium: 3 mmol/L — ABNORMAL LOW (ref 3.5–5.1)
Sodium: 138 mmol/L (ref 135–145)
Total Bilirubin: 0.6 mg/dL (ref 0.0–1.2)
Total Protein: 4.9 g/dL — ABNORMAL LOW (ref 6.5–8.1)

## 2024-03-01 LAB — CBC WITH DIFFERENTIAL/PLATELET
Abs Immature Granulocytes: 0.13 10*3/uL — ABNORMAL HIGH (ref 0.00–0.07)
Basophils Absolute: 0 10*3/uL (ref 0.0–0.1)
Basophils Relative: 1 %
Eosinophils Absolute: 0.2 10*3/uL (ref 0.0–0.5)
Eosinophils Relative: 3 %
HCT: 35 % — ABNORMAL LOW (ref 36.0–46.0)
Hemoglobin: 11 g/dL — ABNORMAL LOW (ref 12.0–15.0)
Immature Granulocytes: 3 %
Lymphocytes Relative: 9 %
Lymphs Abs: 0.4 10*3/uL — ABNORMAL LOW (ref 0.7–4.0)
MCH: 30.9 pg (ref 26.0–34.0)
MCHC: 31.4 g/dL (ref 30.0–36.0)
MCV: 98.3 fL (ref 80.0–100.0)
Monocytes Absolute: 0.7 10*3/uL (ref 0.1–1.0)
Monocytes Relative: 14 %
Neutro Abs: 3.4 10*3/uL (ref 1.7–7.7)
Neutrophils Relative %: 70 %
Platelets: 175 10*3/uL (ref 150–400)
RBC: 3.56 MIL/uL — ABNORMAL LOW (ref 3.87–5.11)
RDW: 18.9 % — ABNORMAL HIGH (ref 11.5–15.5)
WBC: 4.9 10*3/uL (ref 4.0–10.5)
nRBC: 0 % (ref 0.0–0.2)

## 2024-03-01 LAB — STOOL CULTURE: E coli, Shiga toxin Assay: NEGATIVE

## 2024-03-01 LAB — MAGNESIUM: Magnesium: 1.9 mg/dL (ref 1.7–2.4)

## 2024-03-01 LAB — STOOL CULTURE REFLEX - RSASHR

## 2024-03-01 MED ORDER — POTASSIUM CHLORIDE 10 MEQ/50ML IV SOLN
10.0000 meq | INTRAVENOUS | Status: AC
  Administered 2024-03-01 (×4): 10 meq via INTRAVENOUS
  Filled 2024-03-01: qty 50

## 2024-03-01 MED ORDER — POTASSIUM CHLORIDE CRYS ER 20 MEQ PO TBCR
40.0000 meq | EXTENDED_RELEASE_TABLET | Freq: Two times a day (BID) | ORAL | Status: DC
  Administered 2024-03-01 – 2024-03-04 (×6): 40 meq via ORAL
  Filled 2024-03-01 (×6): qty 2

## 2024-03-01 MED ORDER — HYDROMORPHONE HCL 1 MG/ML IJ SOLN
0.5000 mg | INTRAMUSCULAR | Status: DC | PRN
  Administered 2024-03-01 – 2024-03-03 (×2): 0.5 mg via INTRAVENOUS
  Filled 2024-03-01 (×2): qty 0.5

## 2024-03-01 MED ORDER — LOPERAMIDE HCL 2 MG PO CAPS
4.0000 mg | ORAL_CAPSULE | ORAL | Status: DC | PRN
  Administered 2024-03-01 – 2024-03-06 (×6): 4 mg via ORAL
  Filled 2024-03-01 (×6): qty 2

## 2024-03-01 MED ORDER — FAMOTIDINE 20 MG PO TABS
40.0000 mg | ORAL_TABLET | Freq: Every day | ORAL | Status: DC
  Administered 2024-03-01 – 2024-03-03 (×3): 40 mg via ORAL
  Filled 2024-03-01 (×3): qty 2

## 2024-03-01 MED ORDER — CHOLESTYRAMINE LIGHT 4 G PO PACK
4.0000 g | PACK | ORAL | Status: DC
  Administered 2024-03-01 – 2024-03-03 (×8): 4 g via ORAL
  Filled 2024-03-01 (×10): qty 1

## 2024-03-01 NOTE — Progress Notes (Signed)
 She is still having some diarrhea.  I will add cholestyramine  now.  Maybe this will help.  I will stop the Protonix .  Sometimes, this could also lead to diarrhea.  I will try her on some Pepcid .  She did have an abdominal film yesterday.  She has a lot of gas in the small bowel.  Again I suspect this is all from her treatment.  She does have bowel sounds.  I do not see any evidence of obstruction.  She had labs done today.  Her sodium 130.  Potassium 3.0.  BUN 7 creatinine 0.79.  Calcium  8.2 with an albumin of 2.6.  Her white cell count is 4.9.  Hemoglobin 11.  Platelet count 175,000.  She is out of bed.  She does have a lot of skin irritation down to the anal area.  She has had no fever.  There has been no bleeding.  She has had no cough.  Hopefully, cholestyramine  will help slow things down.  She continues on the octreotide .  Again, I do not see that we are going to give her any additional chemotherapy after this.  She really has had a tough time with this diarrhea.  There is no evidence of any infection.  Her vital signs are all stable.  Temperature 97.7.  Pulse 70.  Blood pressure 150/54.  Her abdominal exam shows some abdominal distention.  It is not as prominent as yesterday.  She does have bowel sounds.  There is no fluid wave.  There is no guarding or rebound tenderness.  There is no palpable liver or spleen tip.  Lungs are clear.  Cardiac exam regular rate and rhythm.  She has a 2/6 systolic ejection murmur.  Extremities shows maybe a little bit of edema in the lower legs.  Skin exam shows no rashes.   Ms. Sweeney has the diarrhea from chemotherapy and radiation therapy.  She is on octreotide .  I will add cholestyramine .  We will stop the Lomotil .  We will see if Imodium  helps.  She will get potassium today.  We does need to let this run its course.  I know the staff up on [address] are doing a fantastic job.   Jeralyn Crease, MD  Psalm 77:14

## 2024-03-01 NOTE — Plan of Care (Signed)
  Problem: Clinical Measurements: Goal: Will remain free from infection Outcome: Progressing Goal: Respiratory complications will improve Outcome: Progressing   Problem: Activity: Goal: Risk for activity intolerance will decrease Outcome: Progressing   Problem: Elimination: Goal: Will not experience complications related to bowel motility Outcome: Progressing Goal: Will not experience complications related to urinary retention Outcome: Progressing   Problem: Pain Managment: Goal: General experience of comfort will improve and/or be controlled Outcome: Progressing   Problem: Safety: Goal: Ability to remain free from injury will improve Outcome: Progressing   Problem: Skin Integrity: Goal: Risk for impaired skin integrity will decrease Outcome: Progressing

## 2024-03-01 NOTE — Progress Notes (Signed)
 Progress Note   Patient: [name] FMW:[id number] DOB: 1929/01/15 DOA: 02/26/2024     3 DOS: the patient was seen and examined on 03/01/2024   Brief hospital course:  [name] is a 89 y.o. female with medical history significant of aortic stenosis, type 2 diabetes, hypertension, hyperlipidemia, colon cancer who presents emergency department due to diarrhea.  Patient was with oncology for adenocarcinoma of the rectum.  She is currently on pembrolizumab  and Xeloda .  She has been having longstanding issues with diarrhea since last Friday.  She has had numerous episodes.  She was given Lomotil  and her stools slowed significantly.  She had a flexible sigmoidoscopy with Dr. Charlanne 2 months ago which showed worsening of her masses.  She has not had biopsies since her diarrhea. Patient is admitted to TRH service for further management and evaluation of severe diarrhea.  Assessment and Plan: Severe diarrhea Dehydration Rectal adenocarcinoma She has chronic diarrhea has been managed with lomotil . Infectious etiology, c diff studies negative. Dr. Timmy follow up appreciated. Continue Octreotide . Started cholestyramine  per oncology. Abdominal xray shows low grade obstruction vs ileus. Continue skin care with barrier cream due to her excoriations on bottom due to her diarrhea. Flexi seal system ordered.  Hypokalemia- Due to GI losses.  Continue IV supplementation. Will recheck daily electrolytes and replete accordingly.  Hyponatremia- Due to hypovolemia due to diarrhea. Improved with gentle IV fluids. She is eating fair. Na 138 today.  Anxiety and depression- Continue xanax , lexapro .  Paroxysmal Afib- continue amiodarone .  Hypothyroidism - on Levothyroxine .  GERD -On PPI.   Out of bed to chair. Incentive spirometry. Nursing supportive care. Fall, aspiration precautions. Diet:  Diet Orders (From admission, onward)     Start     Ordered   02/27/24 0658  DIET SOFT Room service  appropriate? Yes; Fluid consistency: Thin  Diet effective now       Question Answer Comment  Room service appropriate? Yes   Fluid consistency: Thin      02/27/24 0657           DVT prophylaxis: enoxaparin  (LOVENOX ) injection 40 mg Start: 02/27/24 1000 SCDs Start: 02/27/24 0125  Level of care: Telemetry   Code Status: Full Code  Subjective: Patient is seen and examined today morning. She is eating breakfast. Had large several episodes of loose stools yesterday per daughter. No abdominal pain.  Physical Exam: Vitals:   02/29/24 0947 02/29/24 1413 02/29/24 2118 03/01/24 0436  BP: (!) 130/51 (!) 137/51 (!) 154/57 (!) 150/54  Pulse:  65 69 70  Resp:  16 14 14   Temp:  97.9 F (36.6 C) 97.7 F (36.5 C) 97.7 F (36.5 C)  TempSrc:  Oral Oral Oral  SpO2:  96% 95% 95%  Weight:      Height:        General - Elderly Caucasian ill female, no acute distress. HEENT - PERRLA, EOMI, atraumatic head, non tender sinuses. Lung - Clear, basal rales, rhonchi, wheezes. Heart - S1, S2 heard, no murmurs, rubs, trace pedal edema. Abdomen - Soft, non tender, bowel sounds good Neuro - Alert, awake and oriented x 3, non focal exam. Skin - Warm and dry.  Data Reviewed:      Latest Ref Rng & Units 03/01/2024    5:00 AM 02/29/2024    6:43 AM 02/28/2024    6:50 AM  CBC  WBC 4.0 - 10.5 K/uL 4.9  4.2  3.7   Hemoglobin 12.0 - 15.0 g/dL 88.9  89.1  10.8   Hematocrit 36.0 - 46.0 % 35.0  34.1  34.4   Platelets 150 - 400 K/uL 175  172  167       Latest Ref Rng & Units 03/01/2024    5:00 AM 02/29/2024    6:43 AM 02/28/2024    6:50 AM  BMP  Glucose 70 - 99 mg/dL 867  875  884   BUN 8 - 23 mg/dL 7  7  7    Creatinine 0.44 - 1.00 mg/dL 9.20  9.29  9.07   Sodium 135 - 145 mmol/L 138  137  134   Potassium 3.5 - 5.1 mmol/L 3.0  3.6  4.6   Chloride 98 - 111 mmol/L 105  108  105   CO2 22 - 32 mmol/L 23  21  21    Calcium  8.9 - 10.3 mg/dL 8.2  8.0  8.2    DG Abd 2 Views Result Date:  02/29/2024 CLINICAL DATA:  Diarrhea. EXAM: ABDOMEN - 2 VIEW COMPARISON:  CT 02/26/2024 FINDINGS: 2 supine views demonstrate gas within normal caliber colon. Gas-filled proximal small bowel loops of up to 4.5 cm. Cholecystectomy. No gross free intraperitoneal air. IMPRESSION: Gas-filled small bowel loops of up to 4.5 cm may represent ileus or low-grade partial small bowel obstruction. Electronically Signed   By: Rockey Kilts M.D.   On: 02/29/2024 12:13    Family Communication: Discussed with patient, daughter at bedside. They understand and agree. All questions answered.  Disposition: Status is: Inpatient Remains inpatient appropriate because: Severe diarrhea, fluid electrolyte management, pain control.  Planned Discharge Destination: Skilled nursing facility/ STR     Time spent: 37 minutes  Author: Concepcion Riser, MD 03/01/2024 9:24 AM Secure chat 7am to 7pm For on call review [email or web address].

## 2024-03-02 DIAGNOSIS — E860 Dehydration: Secondary | ICD-10-CM | POA: Diagnosis not present

## 2024-03-02 DIAGNOSIS — E876 Hypokalemia: Secondary | ICD-10-CM | POA: Diagnosis not present

## 2024-03-02 DIAGNOSIS — E871 Hypo-osmolality and hyponatremia: Secondary | ICD-10-CM | POA: Diagnosis not present

## 2024-03-02 DIAGNOSIS — K529 Noninfective gastroenteritis and colitis, unspecified: Secondary | ICD-10-CM | POA: Diagnosis not present

## 2024-03-02 LAB — CBC WITH DIFFERENTIAL/PLATELET
Abs Immature Granulocytes: 0.08 10*3/uL — ABNORMAL HIGH (ref 0.00–0.07)
Basophils Absolute: 0 10*3/uL (ref 0.0–0.1)
Basophils Relative: 1 %
Eosinophils Absolute: 0.2 10*3/uL (ref 0.0–0.5)
Eosinophils Relative: 4 %
HCT: 34.5 % — ABNORMAL LOW (ref 36.0–46.0)
Hemoglobin: 10.7 g/dL — ABNORMAL LOW (ref 12.0–15.0)
Immature Granulocytes: 2 %
Lymphocytes Relative: 12 %
Lymphs Abs: 0.6 10*3/uL — ABNORMAL LOW (ref 0.7–4.0)
MCH: 30.5 pg (ref 26.0–34.0)
MCHC: 31 g/dL (ref 30.0–36.0)
MCV: 98.3 fL (ref 80.0–100.0)
Monocytes Absolute: 0.8 10*3/uL (ref 0.1–1.0)
Monocytes Relative: 16 %
Neutro Abs: 3.4 10*3/uL (ref 1.7–7.7)
Neutrophils Relative %: 65 %
Platelets: 162 10*3/uL (ref 150–400)
RBC: 3.51 MIL/uL — ABNORMAL LOW (ref 3.87–5.11)
RDW: 19 % — ABNORMAL HIGH (ref 11.5–15.5)
WBC: 5 10*3/uL (ref 4.0–10.5)
nRBC: 0 % (ref 0.0–0.2)

## 2024-03-02 LAB — COMPREHENSIVE METABOLIC PANEL WITH GFR
ALT: 18 U/L (ref 0–44)
AST: 14 U/L — ABNORMAL LOW (ref 15–41)
Albumin: 2.5 g/dL — ABNORMAL LOW (ref 3.5–5.0)
Alkaline Phosphatase: 62 U/L (ref 38–126)
Anion gap: 7 (ref 5–15)
BUN: 8 mg/dL (ref 8–23)
CO2: 23 mmol/L (ref 22–32)
Calcium: 8.2 mg/dL — ABNORMAL LOW (ref 8.9–10.3)
Chloride: 106 mmol/L (ref 98–111)
Creatinine, Ser: 0.88 mg/dL (ref 0.44–1.00)
GFR, Estimated: >60 mL/min (ref >60)
Glucose, Bld: 144 mg/dL — ABNORMAL HIGH (ref 70–99)
Potassium: 3.7 mmol/L (ref 3.5–5.1)
Sodium: 136 mmol/L (ref 135–145)
Total Bilirubin: 0.8 mg/dL (ref 0.0–1.2)
Total Protein: 5 g/dL — ABNORMAL LOW (ref 6.5–8.1)

## 2024-03-02 LAB — MAGNESIUM: Magnesium: 1.8 mg/dL (ref 1.7–2.4)

## 2024-03-02 MED ORDER — IPRATROPIUM-ALBUTEROL 0.5-2.5 (3) MG/3ML IN SOLN
3.0000 mL | RESPIRATORY_TRACT | Status: DC | PRN
  Administered 2024-03-02 – 2024-03-03 (×2): 3 mL via RESPIRATORY_TRACT
  Filled 2024-03-02 (×3): qty 3

## 2024-03-02 NOTE — Plan of Care (Signed)
  Problem: Clinical Measurements: Goal: Will remain free from infection Outcome: Progressing Goal: Diagnostic test results will improve Outcome: Progressing   Problem: Elimination: Goal: Will not experience complications related to bowel motility Outcome: Progressing Goal: Will not experience complications related to urinary retention Outcome: Progressing   Problem: Pain Managment: Goal: General experience of comfort will improve and/or be controlled Outcome: Progressing   Problem: Safety: Goal: Ability to remain free from injury will improve Outcome: Progressing   Problem: Skin Integrity: Goal: Risk for impaired skin integrity will decrease Outcome: Progressing

## 2024-03-02 NOTE — Progress Notes (Signed)
 Progress Note   Patient: [name] FMW:[id number] DOB: 1929-01-02 DOA: 02/26/2024     4 DOS: the patient was seen and examined on 03/02/2024   Brief hospital course:  [name] is a 89 y.o. female with medical history significant of aortic stenosis, type 2 diabetes, hypertension, hyperlipidemia, colon cancer who presents emergency department due to diarrhea.  Patient was with oncology for adenocarcinoma of the rectum.  She is currently on pembrolizumab  and Xeloda .  She has been having longstanding issues with diarrhea since last Friday.  She has had numerous episodes.  She was given Lomotil  and her stools slowed significantly.  She had a flexible sigmoidoscopy with Dr. Charlanne 2 months ago which showed worsening of her masses.  She has not had biopsies since her diarrhea. Patient is admitted to TRH service for further management and evaluation of severe diarrhea.  Assessment and Plan: Severe diarrhea Dehydration Rectal adenocarcinoma She has chronic diarrhea has been managed with lomotil . Infectious etiology, c diff studies negative. Dr. Timmy follow up appreciated. Continue Octreotide , cholestyramine  per oncology. Abdominal xray shows low grade obstruction vs ileus. Continue imodium  PRN. She is able to tolerate flexiseal. Continue pain control with IV dilaudid . Continue skin care with barrier cream due to her excoriations on bottom due to her diarrhea.  Hypokalemia- Due to GI losses.  S/p IV supplementation. Continue oral kcl supplements. Will recheck daily electrolytes and replete accordingly.  Hyponatremia- Due to hypovolemia due to diarrhea. Improved with gentle IV fluids. She is eating fair. Na 136 today.  Anxiety and depression- Continue xanax , lexapro .  Paroxysmal Afib- continue amiodarone .  Hypothyroidism - on Levothyroxine .  GERD -On PPI.   Out of bed to chair. Incentive spirometry. Nursing supportive care. Fall, aspiration precautions. Diet:  Diet Orders  (From admission, onward)     Start     Ordered   02/27/24 0658  DIET SOFT Room service appropriate? Yes; Fluid consistency: Thin  Diet effective now       Question Answer Comment  Room service appropriate? Yes   Fluid consistency: Thin      02/27/24 0657           DVT prophylaxis: enoxaparin  (LOVENOX ) injection 40 mg Start: 02/27/24 1000 SCDs Start: 02/27/24 0125  Level of care: Telemetry   Code Status: Full Code  Subjective: Patient is seen and examined today morning. She has flexiseal. Pain better today. No abdominal discomfort. Stool bag with liquid stool.  Physical Exam: Vitals:   03/01/24 2156 03/01/24 2230 03/01/24 2233 03/02/24 0529  BP: 126/64 126/64 126/64 (!) 128/54  Pulse: 62   (!) 57  Resp: 15   14  Temp: 98.8 F (37.1 C)   98.4 F (36.9 C)  TempSrc: Oral   Oral  SpO2: 100%   100%  Weight:      Height:        General - Elderly Caucasian ill female, no acute distress. HEENT - PERRLA, EOMI, atraumatic head, non tender sinuses. Lung - Clear, basal rales, rhonchi, wheezes. Heart - S1, S2 heard, no murmurs, rubs, trace pedal edema. Abdomen - Soft, non tender, bowel sounds good Neuro - Alert, awake and oriented x 3, flexi seal noted with liquid stool. Skin - Warm and dry.  Data Reviewed:      Latest Ref Rng & Units 03/02/2024    5:09 AM 03/01/2024    5:00 AM 02/29/2024    6:43 AM  CBC  WBC 4.0 - 10.5 K/uL 5.0  4.9  4.2  Hemoglobin 12.0 - 15.0 g/dL 89.2  88.9  89.1   Hematocrit 36.0 - 46.0 % 34.5  35.0  34.1   Platelets 150 - 400 K/uL 162  175  172       Latest Ref Rng & Units 03/02/2024    5:09 AM 03/01/2024    5:00 AM 02/29/2024    6:43 AM  BMP  Glucose 70 - 99 mg/dL 855  867  875   BUN 8 - 23 mg/dL 8  7  7    Creatinine 0.44 - 1.00 mg/dL 9.11  9.20  9.29   Sodium 135 - 145 mmol/L 136  138  137   Potassium 3.5 - 5.1 mmol/L 3.7  3.0  3.6   Chloride 98 - 111 mmol/L 106  105  108   CO2 22 - 32 mmol/L 23  23  21    Calcium  8.9 - 10.3 mg/dL 8.2  8.2   8.0    No results found.   Family Communication: Discussed with patient, daughter at bedside. They understand and agree. All questions answered.  Disposition: Status is: Inpatient Remains inpatient appropriate because: Severe diarrhea, fluid electrolyte management, pain control.  Planned Discharge Destination: Skilled nursing facility/ STR     Time spent: 38 minutes  Author: Concepcion Riser, MD 03/02/2024 1:42 PM Secure chat 7am to 7pm For on call review [email or web address].

## 2024-03-02 NOTE — Plan of Care (Signed)
  Problem: Education: Goal: Knowledge of General Education information will improve Description: Including pain rating scale, medication(s)/side effects and non-pharmacologic comfort measures Outcome: Progressing   Problem: Health Behavior/Discharge Planning: Goal: Ability to manage health-related needs will improve Outcome: Progressing   Problem: Clinical Measurements: Goal: Ability to maintain clinical measurements within normal limits will improve Outcome: Progressing   Problem: Activity: Goal: Risk for activity intolerance will decrease Outcome: Progressing   Problem: Nutrition: Goal: Adequate nutrition will be maintained Outcome: Progressing   Problem: Coping: Goal: Level of anxiety will decrease Outcome: Progressing   Problem: Elimination: Goal: Will not experience complications related to bowel motility Outcome: Progressing   Problem: Pain Managment: Goal: General experience of comfort will improve and/or be controlled Outcome: Progressing

## 2024-03-03 ENCOUNTER — Other Ambulatory Visit

## 2024-03-03 ENCOUNTER — Ambulatory Visit

## 2024-03-03 ENCOUNTER — Inpatient Hospital Stay (HOSPITAL_COMMUNITY)

## 2024-03-03 ENCOUNTER — Encounter: Payer: Self-pay | Admitting: Hematology & Oncology

## 2024-03-03 ENCOUNTER — Ambulatory Visit: Admitting: Hematology & Oncology

## 2024-03-03 DIAGNOSIS — E876 Hypokalemia: Secondary | ICD-10-CM | POA: Diagnosis not present

## 2024-03-03 DIAGNOSIS — R140 Abdominal distension (gaseous): Secondary | ICD-10-CM

## 2024-03-03 DIAGNOSIS — K529 Noninfective gastroenteritis and colitis, unspecified: Secondary | ICD-10-CM | POA: Diagnosis not present

## 2024-03-03 DIAGNOSIS — E871 Hypo-osmolality and hyponatremia: Secondary | ICD-10-CM | POA: Diagnosis not present

## 2024-03-03 DIAGNOSIS — E860 Dehydration: Secondary | ICD-10-CM | POA: Diagnosis not present

## 2024-03-03 DIAGNOSIS — C20 Malignant neoplasm of rectum: Secondary | ICD-10-CM | POA: Diagnosis not present

## 2024-03-03 LAB — CBC WITH DIFFERENTIAL/PLATELET
Abs Immature Granulocytes: 0.06 10*3/uL (ref 0.00–0.07)
Basophils Absolute: 0 10*3/uL (ref 0.0–0.1)
Basophils Relative: 1 %
Eosinophils Absolute: 0.3 10*3/uL (ref 0.0–0.5)
Eosinophils Relative: 5 %
HCT: 32.4 % — ABNORMAL LOW (ref 36.0–46.0)
Hemoglobin: 10 g/dL — ABNORMAL LOW (ref 12.0–15.0)
Immature Granulocytes: 1 %
Lymphocytes Relative: 15 %
Lymphs Abs: 0.8 10*3/uL (ref 0.7–4.0)
MCH: 30.1 pg (ref 26.0–34.0)
MCHC: 30.9 g/dL (ref 30.0–36.0)
MCV: 97.6 fL (ref 80.0–100.0)
Monocytes Absolute: 0.7 10*3/uL (ref 0.1–1.0)
Monocytes Relative: 13 %
Neutro Abs: 3.6 10*3/uL (ref 1.7–7.7)
Neutrophils Relative %: 65 %
Platelets: 153 10*3/uL (ref 150–400)
RBC: 3.32 MIL/uL — ABNORMAL LOW (ref 3.87–5.11)
RDW: 18.9 % — ABNORMAL HIGH (ref 11.5–15.5)
WBC: 5.5 10*3/uL (ref 4.0–10.5)
nRBC: 0 % (ref 0.0–0.2)

## 2024-03-03 LAB — COMPREHENSIVE METABOLIC PANEL WITH GFR
ALT: 17 U/L (ref 0–44)
AST: 14 U/L — ABNORMAL LOW (ref 15–41)
Albumin: 2.3 g/dL — ABNORMAL LOW (ref 3.5–5.0)
Alkaline Phosphatase: 57 U/L (ref 38–126)
Anion gap: 6 (ref 5–15)
BUN: 13 mg/dL (ref 8–23)
CO2: 24 mmol/L (ref 22–32)
Calcium: 8.2 mg/dL — ABNORMAL LOW (ref 8.9–10.3)
Chloride: 107 mmol/L (ref 98–111)
Creatinine, Ser: 0.82 mg/dL (ref 0.44–1.00)
GFR, Estimated: >60 mL/min (ref >60)
Glucose, Bld: 113 mg/dL — ABNORMAL HIGH (ref 70–99)
Potassium: 4.2 mmol/L (ref 3.5–5.1)
Sodium: 137 mmol/L (ref 135–145)
Total Bilirubin: 0.5 mg/dL (ref 0.0–1.2)
Total Protein: 4.7 g/dL — ABNORMAL LOW (ref 6.5–8.1)

## 2024-03-03 LAB — MAGNESIUM: Magnesium: 1.7 mg/dL (ref 1.7–2.4)

## 2024-03-03 MED ORDER — PANCRELIPASE (LIP-PROT-AMYL) 12000-38000 UNITS PO CPEP
24000.0000 [IU] | ORAL_CAPSULE | Freq: Three times a day (TID) | ORAL | Status: DC
  Administered 2024-03-03 – 2024-03-04 (×6): 24000 [IU] via ORAL
  Filled 2024-03-03 (×6): qty 2

## 2024-03-03 NOTE — Progress Notes (Signed)
 Physical Therapy Treatment Patient Details Name: [name] MRN: [id number] DOB: Mar 03, 1929 Today's Date: 03/03/2024   History of Present Illness 89 y.o. female who presents emergency department due to diarrhea. Pt with medical history significant of aortic stenosis, type 2 diabetes, hypertension, hyperlipidemia, rectal cancer, CVA, DDD.    PT Comments  Improved activity tolerance today. Max assist for supine to sit. Pt sat edge of bed ~7 minutes and performed BUE/LE exercises. Then min/mod assist for sit to stand and min assist to pivot to recliner with RW, and then pivoted to bedside commode. SpO2 97% on room air with activity. Encouraged pt to sit in recliner as long as tolerated. Pt put forth good effort.     If plan is discharge home, recommend the following: A lot of help with walking and/or transfers;A lot of help with bathing/dressing/bathroom;Assistance with cooking/housework;Assist for transportation;Help with stairs or ramp for entrance   Can travel by private vehicle        Equipment Recommendations  None recommended by PT    Recommendations for Other Services       Precautions / Restrictions Precautions Precautions: Fall Recall of Precautions/Restrictions: Intact Precaution/Restrictions Comments: no falls in past 6 months; rectal tube Restrictions Weight Bearing Restrictions Per Provider Order: No     Mobility  Bed Mobility Overal bed mobility: Needs Assistance Bed Mobility: Supine to Sit     Supine to sit: Max assist, HOB elevated, Used rails     General bed mobility comments: max assist to pivot hips with bed pad to minimize risk of dislodging rectal tube, and max assist to raise trunk    Transfers Overall transfer level: Needs assistance Equipment used: Rolling walker (2 wheels) Transfers: Sit to/from Stand, Bed to chair/wheelchair/BSC Sit to Stand: Min assist   Step pivot transfers: Min assist       General transfer comment: assist to power up,  VCs hand placement; sit to stand x 2  Step pivot from bed to recliner then to bedside commode.    Ambulation/Gait                   Stairs             Wheelchair Mobility     Tilt Bed    Modified Rankin (Stroke Patients Only)       Balance Overall balance assessment: Needs assistance Sitting-balance support: Feet supported, No upper extremity supported Sitting balance-Leahy Scale: Fair     Standing balance support: Bilateral upper extremity supported, During functional activity, Reliant on assistive device for balance Standing balance-Leahy Scale: Poor                              Communication Communication Communication: No apparent difficulties  Cognition Arousal: Alert Behavior During Therapy: WFL for tasks assessed/performed                             Following commands: Intact      Cueing Cueing Techniques: Verbal cues  Exercises General Exercises - Upper Extremity Shoulder Flexion: AROM, Both, 10 reps, Supine General Exercises - Lower Extremity Ankle Circles/Pumps: AROM, Both, 15 reps, Seated Long Arc Quad: AROM, Both, 10 reps, Seated Hip Flexion/Marching: AROM, Both, 10 reps, Seated    General Comments        Pertinent Vitals/Pain Pain Assessment Pain Assessment: Faces Faces Pain Scale: Hurts even more Pain Location: rectum Pain  Descriptors / Indicators: Grimacing, Discomfort Pain Intervention(s): Limited activity within patient's tolerance, Monitored during session, Repositioned    Home Living                          Prior Function            PT Goals (current goals can now be found in the care plan section) Acute Rehab PT Goals Patient Stated Goal: get strong enough to go home PT Goal Formulation: With patient/family Time For Goal Achievement: 03/13/24 Potential to Achieve Goals: Fair Progress towards PT goals: Progressing toward goals    Frequency    Min 3X/week      PT Plan       Co-evaluation PT/OT/SLP Co-Evaluation/Treatment: Yes Reason for Co-Treatment: To address functional/ADL transfers PT goals addressed during session: Mobility/safety with mobility;Balance;Proper use of DME        AM-PAC PT 6 Clicks Mobility   Outcome Measure  Help needed turning from your back to your side while in a flat bed without using bedrails?: A Little Help needed moving from lying on your back to sitting on the side of a flat bed without using bedrails?: A Lot Help needed moving to and from a bed to a chair (including a wheelchair)?: A Lot Help needed standing up from a chair using your arms (e.g., wheelchair or bedside chair)?: A Little Help needed to walk in hospital room?: A Lot Help needed climbing 3-5 steps with a railing? : Total 6 Click Score: 13    End of Session Equipment Utilized During Treatment: Gait belt Activity Tolerance: Patient limited by fatigue;Patient limited by pain Patient left: in chair;with call bell/phone within reach;with family/visitor present;with chair alarm set Nurse Communication: Mobility status PT Visit Diagnosis: Muscle weakness (generalized) (M62.81);Difficulty in walking, not elsewhere classified (R26.2);Other abnormalities of gait and mobility (R26.89)     Time: 9087-9062 PT Time Calculation (min) (ACUTE ONLY): 25 min  Charges:    $Therapeutic Exercise: 8-22 mins $Therapeutic Activity: 8-22 mins PT General Charges $$ ACUTE PT VISIT: 1 Visit                     Sylvan Delon Copp PT 03/03/2024  Acute Rehabilitation Services  Office [phone]

## 2024-03-03 NOTE — Progress Notes (Signed)
 She is still having diarrhea.  She has a rectal tube in.  She is on octreotide .  She is on cholestyramine .  Again, she has a diarrhea.  I am surprised that this is still so persistent.  At least, her labs are doing little bit better.  Sodium 137.  Potassium 4.2.  BUN 13 creatinine 0.82.  Calcium  8.2 with an albumin of 2.3.  LFTs show SGPT 70 SGOT 14.  White cell count 5.5.  Hemoglobin 10.  Platelet count is 153,000.  She has little bit of abdominal distention.  She says she has a lot of gas.  She has had no vomiting.  There is been no fever.  She has had no bleeding.  Her vital signs show temperature of 98.3.  Pulse 61.  Blood pressure 118/54.  Her head and neck exam shows no ocular or oral lesions.  She has no scleral icterus.  Her lungs are clear bilaterally.  Cardiac exam regular rate and rhythm.  Abdomen is somewhat distended.  It is soft.  Bowel sounds are present maybe slightly decreased.  There is no obvious fluid wave.  There is no palpable abdominal mass.  There is no palpable liver or spleen tip.  Extremities shows no clubbing, cyanosis or edema.  Neurological exam is nonfocal.  I will have to get another abdominal film on her.  I will try her on some Creon.  Maybe, this might help with there is any element of pancreatic insufficiency that could be leading to some of the diarrhea.  At least, her labs do look a little bit better.  She is not hypokalemic.  I am just little surprised this is taking so long to improve.  I do appreciate everybody's care for her on 6 E.    Jeralyn Crease, MD  Proverbs 17:17

## 2024-03-03 NOTE — Progress Notes (Signed)
 Rectal tube dislodged per patient. Stool is noted to be thick, will leave out at this time.

## 2024-03-03 NOTE — Plan of Care (Signed)
  Problem: Education: Goal: Knowledge of General Education information will improve Description: Including pain rating scale, medication(s)/side effects and non-pharmacologic comfort measures Outcome: Progressing   Problem: Coping: Goal: Level of anxiety will decrease Outcome: Progressing   Problem: Elimination: Goal: Will not experience complications related to urinary retention Outcome: Progressing   Problem: Pain Managment: Goal: General experience of comfort will improve and/or be controlled Outcome: Progressing   Problem: Safety: Goal: Ability to remain free from injury will improve Outcome: Progressing

## 2024-03-03 NOTE — Progress Notes (Signed)
 Progress Note   Patient: [name] FMW:[id number] DOB: [birth year] DOA: 02/26/2024     5 DOS: the patient was seen and examined on 03/03/2024   Brief hospital course:  [name] is a 89 y.o. female with medical history significant of aortic stenosis, type 2 diabetes, hypertension, hyperlipidemia, colon cancer who presents emergency department due to diarrhea.  Patient was with oncology for adenocarcinoma of the rectum.  She is currently on pembrolizumab  and Xeloda .  She has been having longstanding issues with diarrhea since last Friday.  She has had numerous episodes.  She was given Lomotil  and her stools slowed significantly.  She had a flexible sigmoidoscopy with Dr. Charlanne 2 months ago which showed worsening of her masses.  She has not had biopsies since her diarrhea. Patient is admitted to TRH service for further management and evaluation of severe diarrhea.  Assessment and Plan: Severe diarrhea Dehydration Rectal adenocarcinoma H/o chronic diarrhea has been managed with lomotil . GI panel, c diff studies negative. Dr. Timmy follow up appreciated. Continue Octreotide , cholestyramine , creon per oncology. Repeat abdominal xray non obstructive bowel gas pattern. Continue imodium  PRN. Continue flexiseal and skin care. Continue pain control with IV dilaudid .  Hypokalemia- Due to GI losses. S/p IV supplementation. Continue oral kcl supplements. Daily electrolytes and replete accordingly.  Hyponatremia- Due to hypovolemia due to diarrhea. Improved with gentle IV fluids. She is eating fair. Na 137 today.  Anxiety and depression- Continue xanax , lexapro .  Paroxysmal Afib- continue amiodarone .  Hypothyroidism - on Levothyroxine .  GERD -On PPI.   Out of bed to chair. Incentive spirometry. Nursing supportive care. Fall, aspiration precautions. Diet:  Diet Orders (From admission, onward)     Start     Ordered   02/27/24 0658  DIET SOFT Room service appropriate? Yes; Fluid  consistency: Thin  Diet effective now       Question Answer Comment  Room service appropriate? Yes   Fluid consistency: Thin      02/27/24 0657           DVT prophylaxis: enoxaparin  (LOVENOX ) injection 40 mg Start: 02/27/24 1000 SCDs Start: 02/27/24 0125  Level of care: Telemetry   Code Status: Full Code  Subjective: Patient is seen and examined today morning. She has abdominal distension, discomfort. Eating fair. Does not want to get out of bed due to flexi seal. Advised to work with PT.  Physical Exam: Vitals:   03/02/24 2023 03/02/24 2032 03/03/24 0516 03/03/24 1312  BP: (!) 156/57  (!) 118/54 (!) 131/53  Pulse: 68  61 (!) 58  Resp: 15  16 16   Temp: 98.6 F (37 C)  98.3 F (36.8 C) 98.4 F (36.9 C)  TempSrc: Oral  Oral Oral  SpO2: 98% 97% 96% 94%  Weight:      Height:        General - Elderly Caucasian ill female, distress due to abdominal discomfort HEENT - PERRLA, EOMI, atraumatic head, non tender sinuses. Lung - Clear, basal rales, rhonchi, wheezes. Heart - S1, S2 heard, no murmurs, rubs, trace pedal edema. Abdomen - Soft, diffuse tender, distended, bowel sounds poor Neuro - Alert, awake and oriented x 3, flexi seal noted with liquid stool. Skin - Warm and dry.  Data Reviewed:      Latest Ref Rng & Units 03/03/2024    5:00 AM 03/02/2024    5:09 AM 03/01/2024    5:00 AM  CBC  WBC 4.0 - 10.5 K/uL 5.5  5.0  4.9   Hemoglobin  12.0 - 15.0 g/dL 89.9  89.2  88.9   Hematocrit 36.0 - 46.0 % 32.4  34.5  35.0   Platelets 150 - 400 K/uL 153  162  175       Latest Ref Rng & Units 03/03/2024    5:00 AM 03/02/2024    5:09 AM 03/01/2024    5:00 AM  BMP  Glucose 70 - 99 mg/dL 886  855  867   BUN 8 - 23 mg/dL 13  8  7    Creatinine 0.44 - 1.00 mg/dL 9.17  9.11  9.20   Sodium 135 - 145 mmol/L 137  136  138   Potassium 3.5 - 5.1 mmol/L 4.2  3.7  3.0   Chloride 98 - 111 mmol/L 107  106  105   CO2 22 - 32 mmol/L 24  23  23    Calcium  8.9 - 10.3 mg/dL 8.2  8.2  8.2     DG Abd 2 Views Result Date: 03/03/2024 CLINICAL DATA:  Diarrhea. EXAM: ABDOMEN - 2 VIEW COMPARISON:  Radiograph dated 02/29/2024. FINDINGS: No bowel dilatation or evidence of obstruction. No free air a calculi. Right upper quadrant cholecystectomy clips. Osteopenia with degenerative changes of the spine. No acute osseous pathology. IMPRESSION: Nonobstructive bowel gas pattern. Electronically Signed   By: Vanetta Chou M.D.   On: 03/03/2024 11:57     Family Communication: Discussed with patient, daughter at bedside. They understand and agree. All questions answered.  Disposition: Status is: Inpatient Remains inpatient appropriate because: Severe diarrhea, fluid electrolyte management, pain control.  Planned Discharge Destination: Skilled nursing facility/ STR     Time spent: 38 minutes  Author: Concepcion Riser, MD 03/03/2024 2:34 PM Secure chat 7am to 7pm For on call review [email or web address].

## 2024-03-04 ENCOUNTER — Ambulatory Visit

## 2024-03-04 ENCOUNTER — Encounter: Payer: Self-pay | Admitting: Hematology & Oncology

## 2024-03-04 ENCOUNTER — Other Ambulatory Visit: Payer: Self-pay

## 2024-03-04 DIAGNOSIS — K529 Noninfective gastroenteritis and colitis, unspecified: Secondary | ICD-10-CM | POA: Diagnosis not present

## 2024-03-04 DIAGNOSIS — E860 Dehydration: Secondary | ICD-10-CM | POA: Diagnosis not present

## 2024-03-04 DIAGNOSIS — E876 Hypokalemia: Secondary | ICD-10-CM | POA: Diagnosis not present

## 2024-03-04 DIAGNOSIS — E871 Hypo-osmolality and hyponatremia: Secondary | ICD-10-CM | POA: Diagnosis not present

## 2024-03-04 DIAGNOSIS — C20 Malignant neoplasm of rectum: Secondary | ICD-10-CM | POA: Diagnosis not present

## 2024-03-04 MED ORDER — POTASSIUM CHLORIDE CRYS ER 20 MEQ PO TBCR: 40.0000 meq | EXTENDED_RELEASE_TABLET | Freq: Every day | ORAL | Status: DC

## 2024-03-04 MED ORDER — FAMOTIDINE 20 MG PO TABS
40.0000 mg | ORAL_TABLET | Freq: Two times a day (BID) | ORAL | Status: DC
  Administered 2024-03-04 – 2024-03-08 (×9): 40 mg via ORAL
  Filled 2024-03-04 (×9): qty 2

## 2024-03-04 NOTE — Plan of Care (Signed)

## 2024-03-04 NOTE — Plan of Care (Signed)
  Problem: Education: Goal: Knowledge of General Education information will improve Description: Including pain rating scale, medication(s)/side effects and non-pharmacologic comfort measures Outcome: Progressing   Problem: Clinical Measurements: Goal: Cardiovascular complication will be avoided Outcome: Progressing   Problem: Activity: Goal: Risk for activity intolerance will decrease Outcome: Progressing   Problem: Nutrition: Goal: Adequate nutrition will be maintained Outcome: Progressing   Problem: Coping: Goal: Level of anxiety will decrease Outcome: Progressing   Problem: Elimination: Goal: Will not experience complications related to urinary retention Outcome: Progressing   Problem: Safety: Goal: Ability to remain free from injury will improve Outcome: Progressing

## 2024-03-04 NOTE — Progress Notes (Signed)
 Thankfully, it seems as if the diarrhea is starting to get better now.  She has a rectal tube out.  She I think went all throughout the night without having diarrhea..  Will stop the cholestyramine .  She is having abdominal discomfort.  We did do a abdominal x-ray on her yesterday which was unremarkable.  I think it be worthwhile to hold on the octreotide  also.  Hopefully, she will eat little bit better.  Hopefully, she will be able to get out of bed and move around better.  I know that she is working with Physical Therapy.  Labs not back yet.  She is going to be held from radiotherapy this week.  I think this is a good idea.  She has had no issues with fever.  She has had no cough.  She has had no bleeding.  Her rectal area still is quite sore.  She has a hard time sitting down.  Her vital signs show temperature 98.4.  Pulse 65.  Blood pressure 128/53.  Her lungs are clear bilaterally.  She has good air movement bilaterally.  Cardiac exam regular rate and rhythm.  Abdomen is soft.  Bowel sounds are present.  There is no fluid wave.  There is no palpable liver or spleen tip.  She has a little bit of abdominal distention.  Bowel sounds are present.  Extremities shows no clubbing, cyanosis or edema.  Neurological exam is nonfocal.  Hopefully, the light is at the end of the tunnel with the diarrhea.  We will see how things go today.  Hopefully, we will be able to get her home before the weekend.  I do appreciate the great care she is getting from everybody on 6 E.   Jeralyn Crease, MD  [place] 1:5

## 2024-03-04 NOTE — Progress Notes (Signed)
 Progress Note   Patient: [name] FMW:[id number] DOB: 1929-04-13 DOA: 02/26/2024     6 DOS: the patient was seen and examined on 03/04/2024   Brief hospital course:  [name] is a 89 y.o. female with medical history significant of aortic stenosis, type 2 diabetes, hypertension, hyperlipidemia, colon cancer who presents emergency department due to diarrhea.  Patient was with oncology for adenocarcinoma of the rectum.  She is currently on pembrolizumab  and Xeloda .  She has been having longstanding issues with diarrhea since last Friday.  She has had numerous episodes.  She was given Lomotil  and her stools slowed significantly.  She had a flexible sigmoidoscopy with Dr. Charlanne 2 months ago which showed worsening of her masses.  She has not had biopsies since her diarrhea. Patient is admitted to TRH service for further management and evaluation of severe diarrhea.  Assessment and Plan: Severe diarrhea Dehydration Rectal adenocarcinoma H/o chronic diarrhea has been managed with lomotil . GI panel, c diff studies negative. Dr. Timmy follow up appreciated. Stopped Octreotide , cholestyramine . Continue creon per oncology. Repeat abdominal xray non obstructive bowel gas pattern. Continue imodium  PRN. She is off flexi seal, stools softer now.  Continue pain control with IV dilaudid .  Hypokalemia- Due to GI losses. S/p IV supplementation. Continue oral kcl supplements daily.  Hyponatremia- Due to hypovolemia due to diarrhea. Improved with gentle IV fluids. She is eating fair. Na 137 today.  Anxiety and depression- Continue xanax , lexapro .  Paroxysmal Afib- continue amiodarone .  Hypothyroidism - on Levothyroxine .  GERD -On PPI.   Out of bed to chair. Incentive spirometry. Nursing supportive care. Fall, aspiration precautions. Diet:  Diet Orders (From admission, onward)     Start     Ordered   02/27/24 0658  DIET SOFT Room service appropriate? Yes; Fluid consistency: Thin   Diet effective now       Question Answer Comment  Room service appropriate? Yes   Fluid consistency: Thin      02/27/24 0657           DVT prophylaxis: enoxaparin  (LOVENOX ) injection 40 mg Start: 02/27/24 1000 SCDs Start: 02/27/24 0125  Level of care: Telemetry   Code Status: Full Code  Subjective: Patient is seen and examined today morning. She has abdominal discomfort, feels like gas, bloating. Loose stools improved, now soft. Advised out of bed.   Physical Exam: Vitals:   03/03/24 1312 03/03/24 2109 03/03/24 2252 03/04/24 0625  BP: (!) 131/53 (!) 155/61 (!) 155/61 (!) 128/53  Pulse: (!) 58 68  65  Resp: 16 14  14   Temp: 98.4 F (36.9 C) 98.6 F (37 C)  98.4 F (36.9 C)  TempSrc: Oral Oral  Oral  SpO2: 94% 95%  93%  Weight:      Height:        General - Elderly Caucasian ill female, distress due to abdominal discomfort HEENT - PERRLA, EOMI, atraumatic head, non tender sinuses. Lung - Clear, basal rales, rhonchi, wheezes. Heart - S1, S2 heard, no murmurs, rubs, trace pedal edema. Abdomen - Soft, diffuse tender, distended, bowel sounds poor Neuro - Alert, awake and oriented x 3, flexi seal out. Skin - Warm and dry.  Data Reviewed:      Latest Ref Rng & Units 03/03/2024    5:00 AM 03/02/2024    5:09 AM 03/01/2024    5:00 AM  CBC  WBC 4.0 - 10.5 K/uL 5.5  5.0  4.9   Hemoglobin 12.0 - 15.0 g/dL 89.9  89.2  11.0   Hematocrit 36.0 - 46.0 % 32.4  34.5  35.0   Platelets 150 - 400 K/uL 153  162  175       Latest Ref Rng & Units 03/03/2024    5:00 AM 03/02/2024    5:09 AM 03/01/2024    5:00 AM  BMP  Glucose 70 - 99 mg/dL 886  855  867   BUN 8 - 23 mg/dL 13  8  7    Creatinine 0.44 - 1.00 mg/dL 9.17  9.11  9.20   Sodium 135 - 145 mmol/L 137  136  138   Potassium 3.5 - 5.1 mmol/L 4.2  3.7  3.0   Chloride 98 - 111 mmol/L 107  106  105   CO2 22 - 32 mmol/L 24  23  23    Calcium  8.9 - 10.3 mg/dL 8.2  8.2  8.2    DG Abd 2 Views Result Date: 03/03/2024 CLINICAL DATA:   Diarrhea. EXAM: ABDOMEN - 2 VIEW COMPARISON:  Radiograph dated 02/29/2024. FINDINGS: No bowel dilatation or evidence of obstruction. No free air a calculi. Right upper quadrant cholecystectomy clips. Osteopenia with degenerative changes of the spine. No acute osseous pathology. IMPRESSION: Nonobstructive bowel gas pattern. Electronically Signed   By: Vanetta Chou M.D.   On: 03/03/2024 11:57     Family Communication: Discussed with patient, daughter in law at bedside. They understand and agree. All questions answered.  Disposition: Status is: Inpatient Remains inpatient appropriate because: Severe diarrhea, fluid electrolyte management, pain control.  Planned Discharge Destination: Skilled nursing facility/ STR     Time spent: 39 minutes  Author: Concepcion Riser, MD 03/04/2024 12:13 PM Secure chat 7am to 7pm For on call review [email or web address]

## 2024-03-04 NOTE — Progress Notes (Signed)
 Occupational Therapy Treatment Patient Details Name: [name] MRN: [id number] DOB: 1929-07-24 Today's Date: 03/04/2024   History of present illness 89 y.o. female who presents emergency department due to diarrhea. Pt with medical history significant of aortic stenosis, type 2 diabetes, hypertension, hyperlipidemia, rectal cancer, CVA, DDD.   OT comments  Patient seen by skilled OT this am. Patient reports she does not want ehab follow up but [facility] services and has all DME. Patient performed mobility to and from [facility] with amb component with Rw with improved tolerance and balance. See status outlined below. Patient will benefit from continued inpatient follow up therapy, <3 hours/day and to continue Acute OT services to progress function and safety while in hospital setting.        If plan is discharge home, recommend the following:  A lot of help with walking and/or transfers;A lot of help with bathing/dressing/bathroom;Direct supervision/assist for medications management;Direct supervision/assist for financial management;Assist for transportation;Help with stairs or ramp for entrance   Equipment Recommendations  Other (comment) (patient reports having all DME)       Precautions / Restrictions Precautions Precautions: Fall Recall of Precautions/Restrictions: Intact Precaution/Restrictions Comments: no falls in past 6 months Restrictions Weight Bearing Restrictions Per Provider Order: No       Mobility Bed Mobility Overal bed mobility:  (patient was oob to recliner and remained)                  Transfers Overall transfer level: Needs assistance Equipment used: Rolling walker (2 wheels) Transfers: Sit to/from Stand, Bed to chair/wheelchair/BSC Sit to Stand: Contact guard assist     Step pivot transfers: Contact guard assist     General transfer comment: min cues for hand placement     Balance Overall balance assessment: Needs assistance Sitting-balance support:  Feet supported Sitting balance-Leahy Scale: Good     Standing balance support: Bilateral upper extremity supported, During functional activity, Reliant on assistive device for balance Standing balance-Leahy Scale: Fair                             ADL either performed or assessed with clinical judgement   ADL Overall ADL's : Needs assistance/impaired Eating/Feeding: Independent;Sitting   Grooming: Wash/dry hands;Wash/dry face;Oral care;Sitting;Set up   Upper Body Bathing: Set up;Sitting   Lower Body Bathing: Moderate assistance Lower Body Bathing Details (indicate cue type and reason): decreased functionl reach to LE's Upper Body Dressing : Set up;Standing   Lower Body Dressing: Moderate assistance;Sitting/lateral leans Lower Body Dressing Details (indicate cue type and reason): demonstrated figure 4 for donning LE garments over feet Toilet Transfer: Rolling walker (2 wheels);BSC/3in1;Contact guard assist   Toileting- Clothing Manipulation and Hygiene: Moderate assistance;Sitting/lateral lean       Functional mobility during ADLs: Contact guard assist;Rolling walker (2 wheels) General ADL Comments: min cues for breathing techniques and educated on use of reacher for LB assist    Extremity/Trunk Assessment Upper Extremity Assessment Upper Extremity Assessment: Generalized weakness;Right hand dominant   Lower Extremity Assessment Lower Extremity Assessment: Generalized weakness        Vision   Vision Assessment?: No apparent visual deficits         Communication Communication Communication: No apparent difficulties   Cognition Arousal: Alert Behavior During Therapy: WFL for tasks assessed/performed Cognition: No apparent impairments  Following commands: Intact        Cueing   Cueing Techniques: Verbal cues        General Comments min cues for pacing and breathing integration    Pertinent Vitals/  Pain       Pain Assessment Pain Assessment: Faces Faces Pain Scale: Hurts even more Pain Location: rectum Pain Descriptors / Indicators: Grimacing, Discomfort Pain Intervention(s): Monitored during session, Repositioned   Frequency  Min 2X/week        Progress Toward Goals  OT Goals(current goals can now be found in the care plan section)  Progress towards OT goals: Progressing toward goals  Acute Rehab OT Goals OT Goal Formulation: With patient Time For Goal Achievement: 03/13/24 Potential to Achieve Goals: Good ADL Goals Pt Will Perform Upper Body Bathing: with supervision;sitting Pt Will Perform Lower Body Bathing: with min assist;sitting/lateral leans Pt Will Perform Upper Body Dressing: with set-up;sitting Pt Will Perform Lower Body Dressing: with mod assist;sit to/from stand Pt Will Transfer to Toilet: with contact guard assist;bedside commode;stand pivot transfer Pt Will Perform Toileting - Clothing Manipulation and hygiene: with min assist;sitting/lateral leans Pt/caregiver will Perform Home Exercise Program: Increased strength;With Supervision;With written HEP provided;Both right and left upper extremity  Plan         AM-PAC OT 6 Clicks Daily Activity     Outcome Measure   Help from another person eating meals?: None Help from another person taking care of personal grooming?: None Help from another person toileting, which includes using toliet, bedpan, or urinal?: A Little Help from another person bathing (including washing, rinsing, drying)?: A Lot Help from another person to put on and taking off regular upper body clothing?: A Little Help from another person to put on and taking off regular lower body clothing?: A Lot 6 Click Score: 18    End of Session Equipment Utilized During Treatment: Gait belt;Rolling walker (2 wheels)  OT Visit Diagnosis: Unsteadiness on feet (R26.81);Other abnormalities of gait and mobility (R26.89);Muscle weakness (generalized)  (M62.81);Pain   Activity Tolerance Patient tolerated treatment well   Patient Left in chair;with call bell/phone within reach;with chair alarm set   Nurse Communication Mobility status        Time: 0935-1001 OT Time Calculation (min): 26 min  Charges: OT General Charges $OT Visit: 1 Visit OT Treatments $Self Care/Home Management : 23-37 mins  [name] OT/L Acute Rehabilitation Department  [phone]  03/04/2024, 3:50 PM

## 2024-03-04 NOTE — TOC Initial Note (Signed)
 Transition of Care [facility]) - Initial/Assessment Note    Patient Details  Name: [name] MRN: [id number] Date of Birth: 17-May-1929  [facility] CM/SW Contact:    Toy LITTIE Agar, RN Phone Number:[phone]  03/04/2024, 12:41 PM  Clinical Narrative:                 [facility] acknowledges consult for SNF. Cm at bedside confirms that patient does not wish to explore SNF options. Patient states that she has 24/7 support at home. Patient states that she is from home where she is modified independent. Patient states that she is feeling stronger and may consider HH at time of d/c. TOC will continue to follow.   Expected Discharge Plan: Home w Home Health Services Barriers to Discharge: Continued Medical Work up   Patient Goals and CMS Choice Patient states their goals for this hospitalization and ongoing recovery are:: Wants to feel better to go home with family CMS Medicare.gov Compare Post Acute Care list provided to::  (n/a) Choice offered to / list presented to : NA [place] ownership interest in [facility].provided to:: Patient    Expected Discharge Plan and Services In-house Referral: NA Discharge Planning Services: CM Consult Post Acute Care Choice: NA Living arrangements for the past 2 months: Single Family Home                 DME Arranged: N/A DME Agency: NA                  Prior Living Arrangements/Services Living arrangements for the past 2 months: Single Family Home Lives with:: Relatives   Do you feel safe going back to the place where you live?: Yes      Need for Family Participation in Patient Care: Yes (Comment) Care giver support system in place?: Yes (comment) Current home services: DME (shower seat , cane , walker) Criminal Activity/Legal Involvement Pertinent to Current Situation/Hospitalization: No - Comment as needed  Activities of Daily Living   ADL Screening (condition at time of admission) Independently performs ADLs?:  Yes (appropriate for developmental age) Is the patient deaf or have difficulty hearing?: No Does the patient have difficulty seeing, even when wearing glasses/contacts?: No Does the patient have difficulty concentrating, remembering, or making decisions?: No  Permission Sought/Granted Permission sought to share information with : Family Supports Permission granted to share information with : Yes, Verbal Permission Granted  Share Information with NAME: Butler Deatherage     Permission granted to share info w Relationship: son  Permission granted to share info w Contact Information: [phone]  Emotional Assessment Appearance:: Appears stated age Attitude/Demeanor/Rapport: Gracious Affect (typically observed): Pleasant, Quiet Orientation: : Oriented to Self, Oriented to Place, Oriented to  Time, Oriented to Situation Alcohol  / Substance Use: Not Applicable Psych Involvement: No (comment)  Admission diagnosis:  Diarrhea [R19.7] Dehydration [E86.0] Hypokalemia [E87.6] Generalized weakness [R53.1] Diarrhea, unspecified type [R19.7] Patient Active Problem List   Diagnosis Date Noted   Diarrhea 02/27/2024   Pain due to onychomycosis of toenails of both feet 10/09/2023   Anticoagulated by anticoagulation treatment 03/26/2023   Multifocal pneumonia 03/26/2023   Acute cystitis without hematuria 03/26/2023   GI bleed 03/20/2023   Upper GI bleed 03/19/2023   Wheezing 03/19/2023   Aortic stenosis 02/26/2023   Coronary artery calcification seen on CT scan 02/26/2023   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   Lung nodule 01/23/2023   Tachycardia-bradycardia syndrome (HCC) 01/22/2023   Sinus node dysfunction (HCC) 01/22/2023  Paroxysmal atrial fibrillation (HCC) 01/20/2023   Malnutrition of moderate degree 01/19/2023   Rectal cancer (HCC) 01/19/2023   Rectal bleeding 01/19/2023   Abnormal digital rectal exam 01/19/2023   Acute respiratory failure with hypoxia (HCC) 01/17/2023   Hypomagnesemia  01/17/2023   Periumbilical hernia 01/17/2023   Polyp of rectum    AVM (arteriovenous malformation) of colon    AVM (arteriovenous malformation) of duodenum, acquired    Lower GI bleed 12/02/2017   GIB (gastrointestinal bleeding) 12/01/2017   Hyponatremia 12/01/2017   Hypokalemia 12/01/2017   HTN (hypertension) 12/01/2017   Diabetes mellitus without complication (HCC) 07/14/2017   Acute pancreatitis 07/12/2017   Elevated transaminase level 07/12/2017   Chest pain 07/10/2017   Hyperlipidemia    Anxiety    Stroke (HCC)    Hypothyroidism    Iron deficiency anemia, unspecified 02/24/2013   Nonspecific abnormal finding in stool contents 02/24/2013   History of colonic polyps 02/24/2013   Symptomatic anemia    PCP:  Onita Rush, MD Pharmacy:   [id number] [place], [place] - [address] [place] [address] Phone: [phone] Fax: [phone]  [id number] [place], [place] - [address] AT [facility] [address] [place] [address] Phone: [phone] Fax: [phone]  [place] [facility] [address] [place] [address] Phone: [phone] Fax: [phone]     Social Drivers of Health (SDOH) Social History: SDOH Screenings   Food Insecurity: No Food Insecurity (02/27/2024)  Housing: Low Risk  (02/27/2024)  Transportation Needs: No Transportation Needs (02/27/2024)  Utilities: Not At Risk (02/27/2024)  Depression (PHQ2-9): Low Risk  (01/24/2024)  Financial Resource Strain: Medium Risk (02/23/2023)  Social Connections: Moderately Integrated (02/27/2024)  Tobacco Use: Medium Risk (02/26/2024)   SDOH Interventions:     Readmission Risk Interventions    03/04/2024   12:35 PM 03/23/2023    4:04 PM 01/19/2023   12:24 PM  Readmission Risk Prevention Plan  Post Dischage Appt   Complete  Medication Screening   Complete   Transportation Screening Complete Complete Complete  HRI or Home Care Consult Complete    Social Work Consult for Recovery Care Planning/Counseling Complete    Palliative Care Screening Complete    Medication Review [employer]) Complete Complete   PCP or Specialist appointment within 3-5 days of discharge  Complete   HRI or Home Care Consult  Complete   SW Recovery Care/Counseling Consult  Complete   Palliative Care Screening  Complete   Skilled Nursing Facility  Complete

## 2024-03-05 ENCOUNTER — Other Ambulatory Visit: Payer: Self-pay

## 2024-03-05 ENCOUNTER — Ambulatory Visit

## 2024-03-05 ENCOUNTER — Encounter: Payer: Self-pay | Admitting: Hematology & Oncology

## 2024-03-05 DIAGNOSIS — E860 Dehydration: Secondary | ICD-10-CM | POA: Diagnosis not present

## 2024-03-05 DIAGNOSIS — E871 Hypo-osmolality and hyponatremia: Secondary | ICD-10-CM | POA: Diagnosis not present

## 2024-03-05 DIAGNOSIS — R197 Diarrhea, unspecified: Secondary | ICD-10-CM | POA: Diagnosis not present

## 2024-03-05 DIAGNOSIS — E876 Hypokalemia: Secondary | ICD-10-CM | POA: Diagnosis not present

## 2024-03-05 DIAGNOSIS — K529 Noninfective gastroenteritis and colitis, unspecified: Secondary | ICD-10-CM | POA: Diagnosis not present

## 2024-03-05 LAB — COMPREHENSIVE METABOLIC PANEL WITH GFR
ALT: 19 U/L (ref 0–44)
AST: 16 U/L (ref 15–41)
Albumin: 2.5 g/dL — ABNORMAL LOW (ref 3.5–5.0)
Alkaline Phosphatase: 70 U/L (ref 38–126)
Anion gap: 9 (ref 5–15)
BUN: 11 mg/dL (ref 8–23)
CO2: 31 mmol/L (ref 22–32)
Calcium: 8 mg/dL — ABNORMAL LOW (ref 8.9–10.3)
Chloride: 91 mmol/L — ABNORMAL LOW (ref 98–111)
Creatinine, Ser: 0.64 mg/dL (ref 0.44–1.00)
GFR, Estimated: >60 mL/min (ref >60)
Glucose, Bld: 134 mg/dL — ABNORMAL HIGH (ref 70–99)
Potassium: 3.7 mmol/L (ref 3.5–5.1)
Sodium: 131 mmol/L — ABNORMAL LOW (ref 135–145)
Total Bilirubin: 0.5 mg/dL (ref 0.0–1.2)
Total Protein: 5.5 g/dL — ABNORMAL LOW (ref 6.5–8.1)

## 2024-03-05 LAB — CBC WITH DIFFERENTIAL/PLATELET
Abs Immature Granulocytes: 0.09 10*3/uL — ABNORMAL HIGH (ref 0.00–0.07)
Basophils Absolute: 0.1 10*3/uL (ref 0.0–0.1)
Basophils Relative: 1 %
Eosinophils Absolute: 0.1 10*3/uL (ref 0.0–0.5)
Eosinophils Relative: 2 %
HCT: 34.2 % — ABNORMAL LOW (ref 36.0–46.0)
Hemoglobin: 10.9 g/dL — ABNORMAL LOW (ref 12.0–15.0)
Immature Granulocytes: 2 %
Lymphocytes Relative: 17 %
Lymphs Abs: 0.9 10*3/uL (ref 0.7–4.0)
MCH: 29.8 pg (ref 26.0–34.0)
MCHC: 31.9 g/dL (ref 30.0–36.0)
MCV: 93.4 fL (ref 80.0–100.0)
Monocytes Absolute: 0.7 10*3/uL (ref 0.1–1.0)
Monocytes Relative: 13 %
Neutro Abs: 3.3 10*3/uL (ref 1.7–7.7)
Neutrophils Relative %: 65 %
Platelets: 167 10*3/uL (ref 150–400)
RBC: 3.66 MIL/uL — ABNORMAL LOW (ref 3.87–5.11)
RDW: 18.3 % — ABNORMAL HIGH (ref 11.5–15.5)
WBC: 5.1 10*3/uL (ref 4.0–10.5)
nRBC: 0 % (ref 0.0–0.2)

## 2024-03-05 LAB — MAGNESIUM: Magnesium: 1.7 mg/dL (ref 1.7–2.4)

## 2024-03-05 LAB — TSH: TSH: 11.756 u[IU]/mL — ABNORMAL HIGH (ref 0.350–4.500)

## 2024-03-05 MED ORDER — ONDANSETRON HCL 4 MG PO TABS
4.0000 mg | ORAL_TABLET | Freq: Three times a day (TID) | ORAL | Status: DC
  Administered 2024-03-05 – 2024-03-08 (×9): 4 mg via ORAL
  Filled 2024-03-05 (×9): qty 1

## 2024-03-05 MED ORDER — PROCHLORPERAZINE EDISYLATE 10 MG/2ML IJ SOLN
10.0000 mg | Freq: Once | INTRAMUSCULAR | Status: AC
  Administered 2024-03-05: 10 mg via INTRAVENOUS
  Filled 2024-03-05: qty 2

## 2024-03-05 MED ORDER — PROCHLORPERAZINE MALEATE 10 MG PO TABS
5.0000 mg | ORAL_TABLET | Freq: Four times a day (QID) | ORAL | Status: DC | PRN
  Administered 2024-03-05 – 2024-03-07 (×3): 5 mg via ORAL
  Filled 2024-03-05 (×3): qty 1

## 2024-03-05 MED ORDER — ONDANSETRON HCL 4 MG/2ML IJ SOLN
4.0000 mg | Freq: Three times a day (TID) | INTRAMUSCULAR | Status: DC
  Filled 2024-03-05: qty 2

## 2024-03-05 NOTE — Plan of Care (Signed)

## 2024-03-05 NOTE — Progress Notes (Signed)
 Progress Note   Patient: [name] FMW:[id number] DOB: [birth year] DOA: 02/26/2024     7 DOS: the patient was seen and examined on 03/05/2024   Brief hospital course:  [name] is a 89 y.o. female with medical history significant of aortic stenosis, type 2 diabetes, hypertension, hyperlipidemia, colon cancer who presents emergency department due to diarrhea.  Patient was with oncology for adenocarcinoma of the rectum.  She is currently on pembrolizumab  and Xeloda .  She has been having longstanding issues with diarrhea since last Friday.  She has had numerous episodes.  She was given Lomotil  and her stools slowed significantly.  She had a flexible sigmoidoscopy with Dr. Charlanne 2 months ago which showed worsening of her masses.  She has not had biopsies since her diarrhea. Patient is admitted to TRH service for further management and evaluation of severe diarrhea.  Assessment and Plan: Severe diarrhea Dehydration Rectal adenocarcinoma H/o chronic diarrhea has been managed with lomotil . GI panel, c diff studies negative. Dr. Timmy follow up appreciated. Continue creon per oncology. Repeat abdominal xray non obstructive bowel gas pattern. Continue imodium  PRN. She is off flexi seal, stools softer now.  Continue pain control, antiemetics, maalox as needed. Ua ordered as she has increased frequency.  Hypokalemia- Due to GI losses. Improved with IV supplementation. Continue oral kcl supplements daily.  Hyponatremia- Due to hypovolemia from diarrhea. She is eating fair. Na 131 today.  Anxiety and depression- Continue xanax , lexapro .  Paroxysmal Afib- continue amiodarone .  Hypothyroidism - on Levothyroxine .  GERD -On PPI.   Out of bed to chair. Incentive spirometry. Nursing supportive care. Fall, aspiration precautions. Diet:  Diet Orders (From admission, onward)     Start     Ordered   02/27/24 0658  DIET SOFT Room service appropriate? Yes; Fluid consistency: Thin  Diet  effective now       Question Answer Comment  Room service appropriate? Yes   Fluid consistency: Thin      02/27/24 0657           DVT prophylaxis: enoxaparin  (LOVENOX ) injection 40 mg Start: 02/27/24 1000 SCDs Start: 02/27/24 0125  Level of care: Telemetry   Code Status: Full Code  Subjective: Patient is seen and examined today morning. She has abdominal discomfort,nausea. Loose stools better per daughter in law at bedside, has increased urinary frequency. Advised out of bed with PT.  Physical Exam: Vitals:   03/04/24 2038 03/04/24 2230 03/04/24 2232 03/05/24 0544  BP: (!) 141/56 (!) 170/59 (!) 170/59 (!) 146/53  Pulse: 68 66  71  Resp: 14   14  Temp: 98.3 F (36.8 C)   98.8 F (37.1 C)  TempSrc: Oral   Oral  SpO2: 92%   92%  Weight:      Height:        General - Elderly Caucasian ill female, mild abdominal discomfort HEENT - PERRLA, EOMI, atraumatic head, non tender sinuses. Lung - Clear, basal rales, rhonchi, wheezes. Heart - S1, S2 heard, no murmurs, rubs, trace pedal edema. Abdomen - Soft, diffuse tender, distended, bowel sounds poor Neuro - Alert, awake and oriented x 3, flexi seal out. Skin - Warm and dry.  Data Reviewed:      Latest Ref Rng & Units 03/05/2024    5:00 AM 03/03/2024    5:00 AM 03/02/2024    5:09 AM  CBC  WBC 4.0 - 10.5 K/uL 5.1  5.5  5.0   Hemoglobin 12.0 - 15.0 g/dL 89.0  89.9  10.7   Hematocrit 36.0 - 46.0 % 34.2  32.4  34.5   Platelets 150 - 400 K/uL 167  153  162       Latest Ref Rng & Units 03/05/2024    5:00 AM 03/03/2024    5:00 AM 03/02/2024    5:09 AM  BMP  Glucose 70 - 99 mg/dL 865  886  855   BUN 8 - 23 mg/dL 11  13  8    Creatinine 0.44 - 1.00 mg/dL 9.35  9.17  9.11   Sodium 135 - 145 mmol/L 131  137  136   Potassium 3.5 - 5.1 mmol/L 3.7  4.2  3.7   Chloride 98 - 111 mmol/L 91  107  106   CO2 22 - 32 mmol/L 31  24  23    Calcium  8.9 - 10.3 mg/dL 8.0  8.2  8.2    No results found.    Family Communication: Discussed  with patient, daughter in law at bedside. They understand and agree. All questions answered.  Disposition: Status is: Inpatient Remains inpatient appropriate because: Severe diarrhea, fluid electrolyte management, pain control.  Planned Discharge Destination: Skilled nursing facility/ STR     Time spent: 38 minutes  Author: Concepcion Riser, MD 03/05/2024 12:36 PM Secure chat 7am to 7pm For on call review [email or web address].

## 2024-03-05 NOTE — Progress Notes (Signed)
 And looks that the diarrhea is getting better.  However, her nausea is getting worse.  She is very nauseated.  I we will try to keep adjusting her medications.  I will stop the Creon.  It seems that the nausea may have gotten a little bit worse when we started the Creon.  She will have Zofran  on schedule now.  Hopefully this may also help.  I will stop her potassium pills.  If she needs potassium, I will give it to her IV.  I will also check a urinalysis on her make sure there is no UTI that could be causing some of the nausea..  She was able to sit in the chair yesterday for 4 hours.  Her labs show sodium 131.  Potassium 3.7.  BUN 11 creatinine 0.64.  Calcium  is 8 with an albumin of 2.5.  Her white cell count is 5.1.  Hemoglobin 10.9.  Platelet count 167,000.  She has had no fever.  There is no cough.  There is no bleeding.  Again, it looks as if the diarrhea is starting to improve.  We just now have to work on the nausea.   Her vital signs show temperature 98.8.  Pulse 71.  Blood pressure 146/53.  Her lungs sound clear bilaterally.  Cardiac exam regular rate and rhythm.  She has no murmurs.  Abdomen is soft.  Bowel sounds are present.  Abdomen is not as distended.  She has no guarding or rebound tenderness.  Extremities shows no clubbing, cyanosis or edema.  Neurological exam shows no focal neurological deficits.  Ms. Coover is improving from the diarrhea.  Again this has to be from the chemotherapy and radiotherapy that she had further recurrence of her rectal cancer.  We will make some medication adjustments to try to help with the nausea.  We will see what a urinalysis has to show.  I still would like to try to get her home by the weekend if possible.  I know that the staff on [address] are doing a fantastic job.!!!!!   Jeralyn Crease, MD  Psalm 34:2

## 2024-03-05 NOTE — Plan of Care (Signed)

## 2024-03-05 NOTE — Progress Notes (Signed)
 Physical Therapy Treatment Patient Details Name: [name] MRN: [id number] DOB: 1928/10/21 Today's Date: 03/05/2024   History of Present Illness 89 y.o. female who presents emergency department due to diarrhea. Pt with medical history significant of aortic stenosis, type 2 diabetes, hypertension, hyperlipidemia, rectal cancer, CVA, DDD.    PT Comments  Excellent progress with mobility today, pt ambulated 50' x 2 with RW with seated rest break 2* fatigue. Pt tolerated session well.     If plan is discharge home, recommend the following: A little help with walking and/or transfers;A little help with bathing/dressing/bathroom;Assistance with cooking/housework;Assist for transportation;Help with stairs or ramp for entrance   Can travel by private vehicle     Yes  Equipment Recommendations  None recommended by PT    Recommendations for Other Services       Precautions / Restrictions Precautions Precautions: Fall Recall of Precautions/Restrictions: Intact Precaution/Restrictions Comments: no falls in past 6 months Restrictions Weight Bearing Restrictions Per Provider Order: No     Mobility  Bed Mobility               General bed mobility comments: up in recliner    Transfers Overall transfer level: Needs assistance   Transfers: Sit to/from Stand Sit to Stand: Contact guard assist           General transfer comment: min cues for hand placement    Ambulation/Gait Ambulation/Gait assistance: Contact guard assist Gait Distance (Feet): 50 Feet Assistive device: Rolling walker (2 wheels) Gait Pattern/deviations: Decreased stride length, Step-through pattern Gait velocity: decr     General Gait Details: 50' x 2 with seated rest break. Distance limited by fatigue. No loss of balance   Stairs             Wheelchair Mobility     Tilt Bed    Modified Rankin (Stroke Patients Only)       Balance Overall balance assessment: Needs  assistance Sitting-balance support: Feet supported Sitting balance-Leahy Scale: Good     Standing balance support: Bilateral upper extremity supported, During functional activity, Reliant on assistive device for balance Standing balance-Leahy Scale: Fair                              Hotel manager: No apparent difficulties  Cognition Arousal: Alert Behavior During Therapy: WFL for tasks assessed/performed                             Following commands: Intact      Cueing Cueing Techniques: Verbal cues  Exercises      General Comments        Pertinent Vitals/Pain Pain Assessment Faces Pain Scale: Hurts little more Pain Location: rectum Pain Descriptors / Indicators: Grimacing, Discomfort Pain Intervention(s): Limited activity within patient's tolerance, Monitored during session, Repositioned    Home Living                          Prior Function            PT Goals (current goals can now be found in the care plan section) Acute Rehab PT Goals Patient Stated Goal: get strong enough to go home PT Goal Formulation: With patient/family Time For Goal Achievement: 03/13/24 Potential to Achieve Goals: Fair Progress towards PT goals: Progressing toward goals    Frequency    Min 3X/week  PT Plan      Co-evaluation              AM-PAC PT 6 Clicks Mobility   Outcome Measure  Help needed turning from your back to your side while in a flat bed without using bedrails?: A Little Help needed moving from lying on your back to sitting on the side of a flat bed without using bedrails?: A Little Help needed moving to and from a bed to a chair (including a wheelchair)?: A Little Help needed standing up from a chair using your arms (e.g., wheelchair or bedside chair)?: A Little Help needed to walk in hospital room?: A Little Help needed climbing 3-5 steps with a railing? : A Lot 6 Click Score:  17    End of Session Equipment Utilized During Treatment: Gait belt Activity Tolerance: Patient limited by fatigue Patient left: in chair;with call bell/phone within reach;with chair alarm set Nurse Communication: Mobility status PT Visit Diagnosis: Muscle weakness (generalized) (M62.81);Difficulty in walking, not elsewhere classified (R26.2);Other abnormalities of gait and mobility (R26.89)     Time: 8886-8873 PT Time Calculation (min) (ACUTE ONLY): 13 min  Charges:    $Gait Training: 8-22 mins PT General Charges $$ ACUTE PT VISIT: 1 Visit                     Sylvan Delon Copp PT 03/05/2024  Acute Rehabilitation Services  Office [phone]

## 2024-03-06 ENCOUNTER — Encounter: Payer: Self-pay | Admitting: Hematology & Oncology

## 2024-03-06 ENCOUNTER — Ambulatory Visit

## 2024-03-06 ENCOUNTER — Ambulatory Visit: Payer: Self-pay | Admitting: Medical Oncology

## 2024-03-06 DIAGNOSIS — C20 Malignant neoplasm of rectum: Secondary | ICD-10-CM | POA: Diagnosis not present

## 2024-03-06 DIAGNOSIS — E871 Hypo-osmolality and hyponatremia: Secondary | ICD-10-CM | POA: Diagnosis not present

## 2024-03-06 DIAGNOSIS — E876 Hypokalemia: Secondary | ICD-10-CM | POA: Diagnosis not present

## 2024-03-06 DIAGNOSIS — E860 Dehydration: Secondary | ICD-10-CM | POA: Diagnosis not present

## 2024-03-06 DIAGNOSIS — K529 Noninfective gastroenteritis and colitis, unspecified: Secondary | ICD-10-CM | POA: Diagnosis not present

## 2024-03-06 LAB — URINALYSIS, W/ REFLEX TO CULTURE (INFECTION SUSPECTED)
Bacteria, UA: NONE SEEN
Bilirubin Urine: NEGATIVE
Glucose, UA: NEGATIVE mg/dL
Hgb urine dipstick: NEGATIVE
Ketones, ur: NEGATIVE mg/dL
Nitrite: NEGATIVE
Protein, ur: NEGATIVE mg/dL
Specific Gravity, Urine: 1.017 (ref 1.005–1.030)
pH: 7 (ref 5.0–8.0)

## 2024-03-06 LAB — COMPREHENSIVE METABOLIC PANEL WITH GFR
ALT: 20 U/L (ref 0–44)
AST: 20 U/L (ref 15–41)
Albumin: 2.4 g/dL — ABNORMAL LOW (ref 3.5–5.0)
Alkaline Phosphatase: 67 U/L (ref 38–126)
Anion gap: 10 (ref 5–15)
BUN: 11 mg/dL (ref 8–23)
CO2: 34 mmol/L — ABNORMAL HIGH (ref 22–32)
Calcium: 8.4 mg/dL — ABNORMAL LOW (ref 8.9–10.3)
Chloride: 93 mmol/L — ABNORMAL LOW (ref 98–111)
Creatinine, Ser: 0.62 mg/dL (ref 0.44–1.00)
GFR, Estimated: >60 mL/min (ref >60)
Glucose, Bld: 115 mg/dL — ABNORMAL HIGH (ref 70–99)
Potassium: 3.1 mmol/L — ABNORMAL LOW (ref 3.5–5.1)
Sodium: 137 mmol/L (ref 135–145)
Total Bilirubin: 0.6 mg/dL (ref 0.0–1.2)
Total Protein: 5.1 g/dL — ABNORMAL LOW (ref 6.5–8.1)

## 2024-03-06 LAB — CBC WITH DIFFERENTIAL/PLATELET
Abs Immature Granulocytes: 0.08 10*3/uL — ABNORMAL HIGH (ref 0.00–0.07)
Basophils Absolute: 0 10*3/uL (ref 0.0–0.1)
Basophils Relative: 1 %
Eosinophils Absolute: 0.2 10*3/uL (ref 0.0–0.5)
Eosinophils Relative: 5 %
HCT: 34.4 % — ABNORMAL LOW (ref 36.0–46.0)
Hemoglobin: 10.9 g/dL — ABNORMAL LOW (ref 12.0–15.0)
Immature Granulocytes: 2 %
Lymphocytes Relative: 21 %
Lymphs Abs: 0.7 10*3/uL (ref 0.7–4.0)
MCH: 30.4 pg (ref 26.0–34.0)
MCHC: 31.7 g/dL (ref 30.0–36.0)
MCV: 95.8 fL (ref 80.0–100.0)
Monocytes Absolute: 0.6 10*3/uL (ref 0.1–1.0)
Monocytes Relative: 16 %
Neutro Abs: 1.9 10*3/uL (ref 1.7–7.7)
Neutrophils Relative %: 55 %
Platelets: 177 10*3/uL (ref 150–400)
RBC: 3.59 MIL/uL — ABNORMAL LOW (ref 3.87–5.11)
RDW: 18.6 % — ABNORMAL HIGH (ref 11.5–15.5)
WBC: 3.4 10*3/uL — ABNORMAL LOW (ref 4.0–10.5)
nRBC: 0 % (ref 0.0–0.2)

## 2024-03-06 LAB — MAGNESIUM: Magnesium: 1.9 mg/dL (ref 1.7–2.4)

## 2024-03-06 LAB — URINE CULTURE

## 2024-03-06 MED ORDER — POTASSIUM CHLORIDE 10 MEQ/50ML IV SOLN
10.0000 meq | INTRAVENOUS | Status: AC
  Administered 2024-03-06 (×4): 10 meq via INTRAVENOUS
  Filled 2024-03-06 (×4): qty 50

## 2024-03-06 NOTE — Progress Notes (Signed)
 [name] continues to improve.  She says she only had diarrhea only 5 times yesterday.  She slept all throughout the night.  She is out of bed.  She walked a little bit.  She seems to be eating a little better.  There is no complaints of stomach pain.  Her labs show sodium 137.  Potassium 3.1.  BUN 11 creatinine 0.60.  Calcium  8.4 with albumin of 2.4.  LFTs are normal.  I will give her some IV potassium.  Her white cell count is 3.4.  Hemoglobin 10.9.  Platelet count 177,000.  She has had no cough.  There has been no bleeding.  She has had no fever.  She has had no rashes.  There has been no leg swelling.  Her vital signs show temperature of 97.8.  Pulse 70.  Blood pressure 160/62.  Her lungs sound clear bilaterally.  Cardiac exam regular rate and rhythm.  Abdomen is slightly distended.  She has decent bowel sounds.  There is no fluid wave.  There is no guarding or rebound tenderness.  There is no palpable hepatomegaly.  Extremities shows some trace edema in the lower legs.  Ms. Luckenbaugh is a 89 year old white female with recurrent rectal cancer.  She is getting infusional 5-FU along with radiation therapy.  She was doing great.  She then developed profound diarrhea.  All cultures were negative.  This finally seems to be improving.  I would like to hope that she will be able to get out for the weekend.  Her potassium is low and we need to replace it.  She needs IV potassium.  Again, everything seems to be improving nicely now.  Hopefully, we will be able to get her home in a couple of days.  I do appreciate the incredible care she has gotten from everybody up on 6 E.   Jeralyn Crease, MD   Psalm 46:10

## 2024-03-06 NOTE — Plan of Care (Signed)
  Problem: Clinical Measurements: Goal: Will remain free from infection Outcome: Progressing Goal: Diagnostic test results will improve Outcome: Progressing   Problem: Activity: Goal: Risk for activity intolerance will decrease Outcome: Progressing   Problem: Elimination: Goal: Will not experience complications related to bowel motility Outcome: Progressing Goal: Will not experience complications related to urinary retention Outcome: Progressing   Problem: Pain Managment: Goal: General experience of comfort will improve and/or be controlled Outcome: Progressing   Problem: Safety: Goal: Ability to remain free from injury will improve Outcome: Progressing   Problem: Skin Integrity: Goal: Risk for impaired skin integrity will decrease Outcome: Progressing

## 2024-03-06 NOTE — Progress Notes (Signed)
 Progress Note   Patient: [name] FMW:[id number] DOB: June 04, 1929 DOA: 02/26/2024     8 DOS: the patient was seen and examined on 03/06/2024   Brief hospital course:  [name] is a 89 y.o. female with medical history significant of aortic stenosis, type 2 diabetes, hypertension, hyperlipidemia, colon cancer who presents emergency department due to diarrhea.  Patient was with oncology for adenocarcinoma of the rectum.  She is currently on pembrolizumab  and Xeloda .  She has been having longstanding issues with diarrhea since last Friday.  She has had numerous episodes.  She was given Lomotil  and her stools slowed significantly.  She had a flexible sigmoidoscopy with Dr. Charlanne 2 months ago which showed worsening of her masses.  She has not had biopsies since her diarrhea. Patient is admitted to TRH service for further management and evaluation of severe diarrhea.  Assessment and Plan: Severe diarrhea Dehydration Rectal adenocarcinoma H/o chronic diarrhea has been managed with lomotil . GI panel, c diff studies negative. Dr. Timmy follow up appreciated. Continue creon per oncology. Repeat abdominal xray non obstructive bowel gas pattern. Continue imodium  PRN. Stools frequency and consistency improved.  Continue pain control, antiemetics, maalox as needed.  Hypokalemia- Due to GI losses. Continue IV supplementation. Oral kcl supplements daily.  Hyponatremia- Due to hypovolemia from diarrhea. She is eating fair. Na 137 today.  Anxiety and depression- Continue xanax , lexapro .  Paroxysmal Afib- continue amiodarone .  Hypothyroidism - on Levothyroxine .  GERD -On PPI.   Out of bed to chair. Incentive spirometry. Nursing supportive care. Fall, aspiration precautions. Diet:  Diet Orders (From admission, onward)     Start     Ordered   02/27/24 0658  DIET SOFT Room service appropriate? Yes; Fluid consistency: Thin  Diet effective now       Question Answer Comment  Room  service appropriate? Yes   Fluid consistency: Thin      02/27/24 0657           DVT prophylaxis: enoxaparin  (LOVENOX ) injection 40 mg Start: 02/27/24 1000 SCDs Start: 02/27/24 0125  Level of care: Telemetry   Code Status: Full Code  Subjective: Patient is seen and examined today morning. Nausea better. Gas distension improved. Advised out of bed with PT.  Physical Exam: Vitals:   03/05/24 0544 03/05/24 1343 03/05/24 2130 03/06/24 0648  BP: (!) 146/53 (!) 140/63 (!) 153/59 (!) 160/62  Pulse: 71 81 75 70  Resp: 14 18 14 14   Temp: 98.8 F (37.1 C) 98.2 F (36.8 C) 98.2 F (36.8 C) 97.8 F (36.6 C)  TempSrc: Oral Oral Oral Oral  SpO2: 92% 93% 93% 91%  Weight:      Height:        General - Elderly Caucasian female, no abdominal discomfort HEENT - PERRLA, EOMI, atraumatic head, non tender sinuses. Lung - Clear, basal rales, rhonchi, wheezes. Heart - S1, S2 heard, no murmurs, rubs, trace pedal edema. Abdomen - Soft, diffuse tender, distended, bowel sounds poor Neuro - Alert, awake and oriented x 3, flexi seal out. Skin - Warm and dry.  Data Reviewed:      Latest Ref Rng & Units 03/06/2024    5:48 AM 03/05/2024    5:00 AM 03/03/2024    5:00 AM  CBC  WBC 4.0 - 10.5 K/uL 3.4  5.1  5.5   Hemoglobin 12.0 - 15.0 g/dL 89.0  89.0  89.9   Hematocrit 36.0 - 46.0 % 34.4  34.2  32.4   Platelets 150 - 400  K/uL 177  167  153       Latest Ref Rng & Units 03/06/2024    5:48 AM 03/05/2024    5:00 AM 03/03/2024    5:00 AM  BMP  Glucose 70 - 99 mg/dL 884  865  886   BUN 8 - 23 mg/dL 11  11  13    Creatinine 0.44 - 1.00 mg/dL 9.37  9.35  9.17   Sodium 135 - 145 mmol/L 137  131  137   Potassium 3.5 - 5.1 mmol/L 3.1  3.7  4.2   Chloride 98 - 111 mmol/L 93  91  107   CO2 22 - 32 mmol/L 34  31  24   Calcium  8.9 - 10.3 mg/dL 8.4  8.0  8.2    No results found.    Family Communication: Discussed with patient, she understand and agree. All questions answered.  Disposition: Status  is: Inpatient Remains inpatient appropriate because: Severe diarrhea, fluid electrolyte management, pain control.  Planned Discharge Destination: Skilled nursing facility/ STR     Time spent: 36 minutes  Author: Concepcion Riser, MD 03/06/2024 12:46 PM Secure chat 7am to 7pm For on call review [email or web address].

## 2024-03-06 NOTE — Progress Notes (Signed)
 Physical Therapy Treatment Patient Details Name: [name] MRN: [id number] DOB: Jul 05, 1929 Today's Date: 03/06/2024   History of Present Illness 89 y.o. female who presents emergency department due to diarrhea. Pt with medical history significant of aortic stenosis, type 2 diabetes, hypertension, hyperlipidemia, rectal cancer, CVA, DDD.    PT Comments  Pt is progressing quite well with mobility, she ambulated 170' with RW, no loss of balance. Pt put forth good effort.     If plan is discharge home, recommend the following: A little help with walking and/or transfers;A little help with bathing/dressing/bathroom;Assistance with cooking/housework;Assist for transportation;Help with stairs or ramp for entrance   Can travel by private vehicle     Yes  Equipment Recommendations  None recommended by PT    Recommendations for Other Services       Precautions / Restrictions Precautions Precautions: Fall Recall of Precautions/Restrictions: Intact Precaution/Restrictions Comments: no falls in past 6 months Restrictions Weight Bearing Restrictions Per Provider Order: No     Mobility  Bed Mobility   Bed Mobility: Sit to Supine, Supine to Sit     Supine to sit: Min assist Sit to supine: Min assist   General bed mobility comments: assist for BLEs into bed, min A to raise trunk    Transfers Overall transfer level: Needs assistance   Transfers: Sit to/from Stand Sit to Stand: Contact guard assist           General transfer comment: min cues for hand placement    Ambulation/Gait Ambulation/Gait assistance: Contact guard assist Gait Distance (Feet): 170 Feet Assistive device: Rolling walker (2 wheels) Gait Pattern/deviations: Decreased stride length, Step-through pattern Gait velocity: decr     General Gait Details: steady, no loss of balance   Stairs             Wheelchair Mobility     Tilt Bed    Modified Rankin (Stroke Patients Only)        Balance Overall balance assessment: Needs assistance Sitting-balance support: Feet supported Sitting balance-Leahy Scale: Good     Standing balance support: Bilateral upper extremity supported, During functional activity, Reliant on assistive device for balance Standing balance-Leahy Scale: Fair                              Hotel manager: No apparent difficulties  Cognition Arousal: Alert Behavior During Therapy: WFL for tasks assessed/performed                             Following commands: Intact      Cueing Cueing Techniques: Verbal cues  Exercises      General Comments        Pertinent Vitals/Pain Pain Assessment Faces Pain Scale: Hurts little more Pain Location: rectum Pain Descriptors / Indicators: Discomfort Pain Intervention(s): Limited activity within patient's tolerance, Monitored during session, Repositioned    Home Living                          Prior Function            PT Goals (current goals can now be found in the care plan section) Acute Rehab PT Goals Patient Stated Goal: get strong enough to go home PT Goal Formulation: With patient/family Time For Goal Achievement: 03/13/24 Potential to Achieve Goals: Fair Progress towards PT goals: Progressing toward goals    Frequency  Min 3X/week      PT Plan      Co-evaluation              AM-PAC PT 6 Clicks Mobility   Outcome Measure  Help needed turning from your back to your side while in a flat bed without using bedrails?: None Help needed moving from lying on your back to sitting on the side of a flat bed without using bedrails?: A Little Help needed moving to and from a bed to a chair (including a wheelchair)?: A Little Help needed standing up from a chair using your arms (e.g., wheelchair or bedside chair)?: A Little Help needed to walk in hospital room?: A Little Help needed climbing 3-5 steps with a railing?  : A Little 6 Click Score: 19    End of Session Equipment Utilized During Treatment: Gait belt Activity Tolerance: Patient tolerated treatment well Patient left: with call bell/phone within reach;Other (comment) (on bedside commode) Nurse Communication: Mobility status PT Visit Diagnosis: Muscle weakness (generalized) (M62.81);Difficulty in walking, not elsewhere classified (R26.2);Other abnormalities of gait and mobility (R26.89)     Time: 8595-8578 PT Time Calculation (min) (ACUTE ONLY): 17 min  Charges:    $Gait Training: 8-22 mins PT General Charges $$ ACUTE PT VISIT: 1 Visit                     Sylvan Delon Copp PT 03/06/2024  Acute Rehabilitation Services  Office [phone]

## 2024-03-07 ENCOUNTER — Inpatient Hospital Stay (HOSPITAL_COMMUNITY)

## 2024-03-07 ENCOUNTER — Ambulatory Visit

## 2024-03-07 ENCOUNTER — Encounter: Payer: Self-pay | Admitting: Hematology & Oncology

## 2024-03-07 DIAGNOSIS — E876 Hypokalemia: Secondary | ICD-10-CM | POA: Diagnosis not present

## 2024-03-07 DIAGNOSIS — K591 Functional diarrhea: Secondary | ICD-10-CM | POA: Diagnosis not present

## 2024-03-07 DIAGNOSIS — R531 Weakness: Secondary | ICD-10-CM

## 2024-03-07 DIAGNOSIS — I480 Paroxysmal atrial fibrillation: Secondary | ICD-10-CM

## 2024-03-07 DIAGNOSIS — E871 Hypo-osmolality and hyponatremia: Secondary | ICD-10-CM | POA: Diagnosis not present

## 2024-03-07 LAB — CBC WITH DIFFERENTIAL/PLATELET
Abs Immature Granulocytes: 0.07 10*3/uL (ref 0.00–0.07)
Basophils Absolute: 0 10*3/uL (ref 0.0–0.1)
Basophils Relative: 1 %
Eosinophils Absolute: 0.1 10*3/uL (ref 0.0–0.5)
Eosinophils Relative: 3 %
HCT: 36.2 % (ref 36.0–46.0)
Hemoglobin: 11.5 g/dL — ABNORMAL LOW (ref 12.0–15.0)
Immature Granulocytes: 2 %
Lymphocytes Relative: 20 %
Lymphs Abs: 0.7 10*3/uL (ref 0.7–4.0)
MCH: 30.5 pg (ref 26.0–34.0)
MCHC: 31.8 g/dL (ref 30.0–36.0)
MCV: 96 fL (ref 80.0–100.0)
Monocytes Absolute: 0.6 10*3/uL (ref 0.1–1.0)
Monocytes Relative: 16 %
Neutro Abs: 2.2 10*3/uL (ref 1.7–7.7)
Neutrophils Relative %: 58 %
Platelets: 204 10*3/uL (ref 150–400)
RBC: 3.77 MIL/uL — ABNORMAL LOW (ref 3.87–5.11)
RDW: 18.5 % — ABNORMAL HIGH (ref 11.5–15.5)
WBC: 3.8 10*3/uL — ABNORMAL LOW (ref 4.0–10.5)
nRBC: 0 % (ref 0.0–0.2)

## 2024-03-07 LAB — URINE CULTURE

## 2024-03-07 LAB — COMPREHENSIVE METABOLIC PANEL WITH GFR
ALT: 25 U/L (ref 0–44)
AST: 23 U/L (ref 15–41)
Albumin: 2.8 g/dL — ABNORMAL LOW (ref 3.5–5.0)
Alkaline Phosphatase: 77 U/L (ref 38–126)
Anion gap: 10 (ref 5–15)
BUN: 11 mg/dL (ref 8–23)
CO2: 31 mmol/L (ref 22–32)
Calcium: 8.7 mg/dL — ABNORMAL LOW (ref 8.9–10.3)
Chloride: 93 mmol/L — ABNORMAL LOW (ref 98–111)
Creatinine, Ser: 0.72 mg/dL (ref 0.44–1.00)
GFR, Estimated: >60 mL/min (ref >60)
Glucose, Bld: 165 mg/dL — ABNORMAL HIGH (ref 70–99)
Potassium: 3.5 mmol/L (ref 3.5–5.1)
Sodium: 134 mmol/L — ABNORMAL LOW (ref 135–145)
Total Bilirubin: 0.4 mg/dL (ref 0.0–1.2)
Total Protein: 5.6 g/dL — ABNORMAL LOW (ref 6.5–8.1)

## 2024-03-07 LAB — MAGNESIUM: Magnesium: 1.9 mg/dL (ref 1.7–2.4)

## 2024-03-07 MED ORDER — PROCHLORPERAZINE MALEATE 10 MG PO TABS: 10.0000 mg | ORAL_TABLET | Freq: Four times a day (QID) | ORAL | 1 refills | Status: DC | PRN

## 2024-03-07 MED ORDER — ZINC OXIDE 40 % EX OINT: TOPICAL_OINTMENT | CUTANEOUS | 0 refills | Status: AC | PRN

## 2024-03-07 MED ORDER — POTASSIUM CHLORIDE CRYS ER 20 MEQ PO TBCR: 20.0000 meq | EXTENDED_RELEASE_TABLET | Freq: Every day | ORAL | 0 refills | Status: DC

## 2024-03-07 MED ORDER — OXYCODONE-ACETAMINOPHEN 5-325 MG PO TABS: 1.0000 | ORAL_TABLET | Freq: Four times a day (QID) | ORAL | 0 refills | Status: AC | PRN

## 2024-03-07 NOTE — Discharge Summary (Signed)
 Physician Discharge Summary   Patient: [name] MRN: [id number] DOB: June 17, 1929  Admit date:     02/26/2024  Discharge date: 03/07/24  Discharge Physician: Concepcion Riser   PCP: Onita Rush, MD   Recommendations at discharge:    PCP follow up in 1 week. Oncology follow up as scheduled.  Discharge Diagnoses: Principal Problem:   Diarrhea Active Problems:   Hyponatremia   Hypokalemia   Rectal cancer (HCC)   Paroxysmal atrial fibrillation (HCC)   Generalized weakness  Resolved Problems:   * No resolved hospital problems. [facility] Course: [name] is a 89 y.o. female with medical history significant of aortic stenosis, type 2 diabetes, hypertension, hyperlipidemia, colon cancer who presents emergency department due to diarrhea.  Patient was with oncology for adenocarcinoma of the rectum.  She is currently on pembrolizumab  and Xeloda .  She has been having longstanding issues with diarrhea since last Friday.  She has had numerous episodes.  She was given Lomotil  and her stools slowed significantly.  She had a flexible sigmoidoscopy with Dr. Charlanne 2 months ago which showed worsening of her masses.  She has not had biopsies since her diarrhea. Patient is admitted to TRH service for further management and evaluation of severe diarrhea.   Assessment and Plan: Severe diarrhea Dehydration Rectal adenocarcinoma H/o chronic diarrhea has been managed with lomotil / imodium . GI panel, c diff studies negative. Dr. Timmy follow up appreciated. She did get somatostatin, creon , cholestyramine  while inpatient. Repeat abdominal xray non obstructive bowel gas pattern. Continue imodium  PRN. Stools frequency and consistency improved.  Advised to continue pain control, antiemetics, kcl supplements at home. Advised PCP follow up, oncology follow up as scheduled.   Hypokalemia- Due to GI losses. Improved with IV supplements. Oral kcl supplements daily.   Hyponatremia- Due to  hypovolemia from diarrhea. She is eating fair. Na stable.   Anxiety and depression- Continue xanax , lexapro .   Paroxysmal Afib- continue amiodarone .   Hypothyroidism - on Levothyroxine .   GERD -On PPI.       Consultants: Oncology Procedures performed: none  Disposition: Home health Diet recommendation:  Discharge Diet Orders (From admission, onward)     Start     Ordered   03/07/24 0000  Diet - low sodium heart healthy        03/07/24 1429           Cardiac diet DISCHARGE MEDICATION: Allergies as of 03/07/2024       Reactions   Aggrenox  [aspirin -dipyridamole  Er] Other (See Comments)   Severe Bleeding and Stomach Pain   Prednisone  Other (See Comments)   Climbs the walls        Medication List     STOP taking these medications    triamterene -hydrochlorothiazide  37.5-25 MG tablet Commonly known as: MAXZIDE-25       TAKE these medications    acetaminophen  325 MG tablet Commonly known as: TYLENOL  Take 2 tablets (650 mg total) by mouth every 6 (six) hours as needed for mild pain (or Fever >/= 101). What changed: reasons to take this   ALPRAZolam  0.5 MG tablet Commonly known as: XANAX  Take 0.5 mg by mouth 2 (two) times daily. For anxiety   amiodarone  200 MG tablet Commonly known as: PACERONE  Take 0.5 tablets (100 mg total) by mouth daily.   amLODipine  5 MG tablet Commonly known as: NORVASC  Take 5 mg by mouth in the morning and at bedtime. What changed: Another medication with the same name was removed. Continue taking this medication,  and follow the directions you see here.   cholecalciferol 25 MCG (1000 UNIT) tablet Commonly known as: VITAMIN D3 Take 1,000 Units by mouth daily.   clotrimazole  1 % cream Commonly known as: LOTRIMIN  Apply 1 Application topically 2 (two) times daily.   cyanocobalamin  1000 MCG tablet Take 1 tablet (1,000 mcg total) by mouth daily.   diphenoxylate -atropine  2.5-0.025 MG tablet Commonly known as: LOMOTIL  Take 1  tablet by mouth 4 (four) times daily as needed for diarrhea or loose stools. Take 1-2 tabs PO bid-qid prn; Max: 8 tabs/day; Info: reduce dose when sx controlled; D/C after 10 days if no improvement What changed:  how much to take when to take this additional instructions   escitalopram  5 MG tablet Commonly known as: LEXAPRO  Take 5 mg by mouth at bedtime.   fluticasone  50 MCG/ACT nasal spray Commonly known as: FLONASE  Place 2 sprays into both nostrils daily. What changed:  when to take this reasons to take this   hydrALAZINE  25 MG tablet Commonly known as: APRESOLINE  Take 1 tablet (25 mg total) by mouth 2 (two) times daily.   levothyroxine  175 MCG tablet Commonly known as: SYNTHROID  Take 175 mcg by mouth every morning.   lidocaine -prilocaine  cream Commonly known as: EMLA  Apply to affected area once What changed:  how much to take how to take this when to take this reasons to take this additional instructions   liver oil-zinc  oxide 40 % ointment Commonly known as: DESITIN Apply topically as needed for irritation.   loperamide  2 MG tablet Commonly known as: IMODIUM  A-D Take 2 mg by mouth in the morning, at noon, in the evening, and at bedtime.   loratadine  10 MG tablet Commonly known as: CLARITIN  Take 1 tablet (10 mg total) by mouth daily.   nystatin -triamcinolone  ointment Commonly known as: MYCOLOG Apply 1 Application topically 2 (two) times daily.   ondansetron  8 MG tablet Commonly known as: Zofran  Take 1 tablet (8 mg total) by mouth every 8 (eight) hours as needed for nausea or vomiting.   oxyCODONE -acetaminophen  5-325 MG tablet Commonly known as: PERCOCET/ROXICET Take 1 tablet by mouth every 6 (six) hours as needed for moderate pain (pain score 4-6) or severe pain (pain score 7-10).   pantoprazole  20 MG tablet Commonly known as: Protonix  Take 1 tablet (20 mg total) by mouth in the morning. Take 30-60 minutes before breakfast   Pepcid  20 MG  tablet Generic drug: famotidine  Take 20 mg by mouth at bedtime.   potassium chloride  SA 20 MEQ tablet Commonly known as: KLOR-CON  M Take 1 tablet (20 mEq total) by mouth at bedtime.   prochlorperazine  10 MG tablet Commonly known as: COMPAZINE  Take 1 tablet (10 mg total) by mouth every 6 (six) hours as needed.   triamcinolone  0.025 % ointment Commonly known as: KENALOG  Apply 1 Application topically 2 (two) times daily.               Discharge Care Instructions  (From admission, onward)           Start     Ordered   03/07/24 0000  No dressing needed        03/07/24 1429            Discharge Exam: Filed Weights   02/26/24 1752  Weight: 76 kg      03/07/2024    1:51 PM 03/07/2024    5:49 AM 03/06/2024    9:07 PM  Vitals with BMI  Systolic 165 147 845  Diastolic  71 56 55  Pulse 78 65 67    General - Elderly Caucasian female, no abdominal discomfort HEENT - PERRLA, EOMI, atraumatic head, non tender sinuses. Lung - Clear, basal rales, rhonchi, wheezes. Heart - S1, S2 heard, no murmurs, rubs, trace pedal edema. Abdomen - Soft, non tender, non distended, bowel sounds poor Neuro - Alert, awake and oriented x 3, flexi seal out. Skin - Warm and dry.   Condition at discharge: stable  The results of significant diagnostics from this hospitalization (including imaging, microbiology, ancillary and laboratory) are listed below for reference.   Imaging Studies: DG Abd 2 Views Result Date: 03/07/2024 CLINICAL DATA:  Follow-up small bowel obstruction. EXAM: ABDOMEN - 2 VIEW COMPARISON:  03/03/2024 FINDINGS: No dilated scratch interval improvement in air-filled loops of small bowel which measure up to 2.5 cm within the right lower quadrant of the abdomen. Similar amount of colonic gas. Right upper quadrant cholecystectomy clips. IMPRESSION: Interval improvement in air-filled loops of small bowel compatible with resolving small-bowel obstruction. Electronically Signed    By: Waddell Calk M.D.   On: 03/07/2024 11:23   DG Abd 2 Views Result Date: 03/03/2024 CLINICAL DATA:  Diarrhea. EXAM: ABDOMEN - 2 VIEW COMPARISON:  Radiograph dated 02/29/2024. FINDINGS: No bowel dilatation or evidence of obstruction. No free air a calculi. Right upper quadrant cholecystectomy clips. Osteopenia with degenerative changes of the spine. No acute osseous pathology. IMPRESSION: Nonobstructive bowel gas pattern. Electronically Signed   By: Vanetta Chou M.D.   On: 03/03/2024 11:57   DG Abd 2 Views Result Date: 02/29/2024 CLINICAL DATA:  Diarrhea. EXAM: ABDOMEN - 2 VIEW COMPARISON:  CT 02/26/2024 FINDINGS: 2 supine views demonstrate gas within normal caliber colon. Gas-filled proximal small bowel loops of up to 4.5 cm. Cholecystectomy. No gross free intraperitoneal air. IMPRESSION: Gas-filled small bowel loops of up to 4.5 cm may represent ileus or low-grade partial small bowel obstruction. Electronically Signed   By: Rockey Kilts M.D.   On: 02/29/2024 12:13   CT ABDOMEN PELVIS W CONTRAST Result Date: 02/26/2024 CLINICAL DATA:  Abdominal pain, diarrhea, colon cancer currently undergoing chemotherapy and radiation therapy EXAM: CT ABDOMEN AND PELVIS WITH CONTRAST TECHNIQUE: Multidetector CT imaging of the abdomen and pelvis was performed using the standard protocol following bolus administration of intravenous contrast. RADIATION DOSE REDUCTION: This exam was performed according to the departmental dose-optimization program which includes automated exposure control, adjustment of the mA and/or kV according to patient size and/or use of iterative reconstruction technique. CONTRAST:  OMNIPAQUE  IOHEXOL  300 MG/ML  SOLN COMPARISON:  01/10/2024, 01/17/2023 FINDINGS: Lower chest: No acute pleural or parenchymal lung disease. Hepatobiliary: No focal liver abnormality is seen. Status post cholecystectomy. No biliary dilatation. Pancreas: Unremarkable. No pancreatic ductal dilatation or  surrounding inflammatory changes. Spleen: Normal in size without focal abnormality. Adrenals/Urinary Tract: Adrenal glands are unremarkable. Kidneys are normal, without renal calculi, focal lesion, or hydronephrosis. Bladder is unremarkable. Stomach/Bowel: Circumferential rectal wall thickening again noted, compatible with known history of rectal cancer. No bowel obstruction or ileus. Gas fluid levels are seen throughout the colon, compatible with given history of diarrhea. No acute inflammatory changes. Small hiatal hernia. Vascular/Lymphatic: Aortic atherosclerosis. 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 No pathologic adenopathy. Reproductive: Uterus and bilateral adnexa are unremarkable. Other: No free fluid or free intraperitoneal gas. No abdominal wall hernia. Musculoskeletal: No  acute or destructive bony abnormalities. Reconstructed images demonstrate no additional findings. IMPRESSION: 1. Stable circumferential rectal wall thickening consistent with known history of rectal cancer. 2. Gas fluid levels throughout the colon consistent with diarrhea. No bowel obstruction or ileus. 3. Aortic Atherosclerosis (ICD10-I70.0). Stable high-grade stenoses within the bilateral common iliac arteries, right greater than left. 4.  Aortic Atherosclerosis (ICD10-I70.0). 5. Small hiatal hernia. Electronically Signed   By: Ozell Daring M.D.   On: 02/26/2024 21:34    Microbiology: Results for orders placed or performed during the hospital encounter of 02/26/24  Urine Culture     Status: Abnormal   Collection Time: 03/06/24  2:44 PM   Specimen: Urine, Clean Catch  Result Value Ref Range Status   Specimen Description   Final    URINE, CLEAN CATCH Performed at [facility], [address] [place], [place] [address]    Special Requests   Final     NONE Reflexed from [id number] Performed at [facility], [address] [place], [place] [address]    Culture MULTIPLE SPECIES PRESENT, SUGGEST RECOLLECTION (A)  Final   Report Status 03/07/2024 FINAL  Final    Labs: CBC: Recent Labs  Lab 03/02/24 0509 03/03/24 0500 03/05/24 0500 03/06/24 0548 03/07/24 1033  WBC 5.0 5.5 5.1 3.4* 3.8*  NEUTROABS 3.4 3.6 3.3 1.9 2.2  HGB 10.7* 10.0* 10.9* 10.9* 11.5*  HCT 34.5* 32.4* 34.2* 34.4* 36.2  MCV 98.3 97.6 93.4 95.8 96.0  PLT 162 153 167 177 204   Basic Metabolic Panel: Recent Labs  Lab 03/02/24 0509 03/03/24 0500 03/05/24 0500 03/06/24 0548 03/07/24 1033  NA 136 137 131* 137 134*  K 3.7 4.2 3.7 3.1* 3.5  CL 106 107 91* 93* 93*  CO2 23 24 31  34* 31  GLUCOSE 144* 113* 134* 115* 165*  BUN 8 13 11 11 11   CREATININE 0.88 0.82 0.64 0.62 0.72  CALCIUM  8.2* 8.2* 8.0* 8.4* 8.7*  MG 1.8 1.7 1.7 1.9 1.9   Liver Function Tests: Recent Labs  Lab 03/02/24 0509 03/03/24 0500 03/05/24 0500 03/06/24 0548 03/07/24 1033  AST 14* 14* 16 20 23   ALT 18 17 19 20 25   ALKPHOS 62 57 70 67 77  BILITOT 0.8 0.5 0.5 0.6 0.4  PROT 5.0* 4.7* 5.5* 5.1* 5.6*  ALBUMIN 2.5* 2.3* 2.5* 2.4* 2.8*   CBG: No results for input(s): GLUCAP in the last 168 hours.  Discharge time spent: 34 minutes.  Signed: Concepcion Riser, MD [facility] 03/07/2024

## 2024-03-07 NOTE — TOC Progression Note (Signed)
 Transition of Care [facility]) - Progression Note    Patient Details  Name: [name] MRN: [id number] Date of Birth: 26-Jan-1929  Transition of Care [facility]) CM/SW Contact  Sonda Manuella Quill, RN Phone Number: 03/07/2024, 2:46 PM  Clinical Narrative:    Orders received for HHPT/OT; spoke w/ pt and family in room; pt agreed to recc services; she does not have an agency preference; spoke w/ Greig Cedar at [facility]; she says agency can provide services; agency contact info placed in follow up provider section of d/c instructions.   Expected Discharge Plan: Home w Home Health Services Barriers to Discharge: Continued Medical Work up  Expected Discharge Plan and Services In-house Referral: NA Discharge Planning Services: CM Consult Post Acute Care Choice: NA Living arrangements for the past 2 months: Single Family Home Expected Discharge Date: 03/07/24               DME Arranged: N/A DME Agency: NA       HH Arranged: PT, OT HH Agency: [facility] Date HH Agency Contacted: 03/07/24 Time HH Agency Contacted: 1445 Representative spoke with at [facility] Agency: Amy Hyatt   Social Determinants of Health (SDOH) Interventions SDOH Screenings   Food Insecurity: No Food Insecurity (02/27/2024)  Housing: Low Risk  (02/27/2024)  Transportation Needs: No Transportation Needs (02/27/2024)  Utilities: Not At Risk (02/27/2024)  Depression (PHQ2-9): Low Risk  (01/24/2024)  Financial Resource Strain: Medium Risk (02/23/2023)  Social Connections: Moderately Integrated (02/27/2024)  Tobacco Use: Medium Risk (02/26/2024)    Readmission Risk Interventions    03/04/2024   12:35 PM 03/23/2023    4:04 PM 01/19/2023   12:24 PM  Readmission Risk Prevention Plan  Post Dischage Appt   Complete  Medication Screening   Complete  Transportation Screening Complete Complete Complete  HRI or Home Care Consult Complete    Social Work Consult for Recovery Care Planning/Counseling Complete    Palliative Care  Screening Complete    Medication Review Oceanographer) Complete Complete   PCP or Specialist appointment within 3-5 days of discharge  Complete   HRI or Home Care Consult  Complete   SW Recovery Care/Counseling Consult  Complete   Palliative Care Screening  Complete   Skilled Nursing Facility  Complete

## 2024-03-07 NOTE — Progress Notes (Signed)
 She has sick earlier this morning.  She is very nauseated.  Again, I am unsure as to why she is so nauseated.  She does have decreased bowel sounds.  She seems to be more distended.  We will have to get another abdominal x-ray on her.  She received potassium yesterday.  I would think that her potassium should be okay.  She ate yesterday.  She got out of bed yesterday.  She had no diarrhea yesterday.  There is no complaints of pain.  I will stop the hydromorphone .  There are no lab work back yet this morning.  She has had no fever.  There is been no bleeding.  She has had no cough or shortness of breath.  Her vital signs show temperature of 98.2.  Pulse 65.  Blood pressure 147/56.  Her lungs are clear bilaterally.  Cardiac exam regular rate and rhythm.  There are no murmurs.  Abdomen is somewhat distended.  Bowel sounds are decreased.  She has no guarding or rebound tenderness.  There is no fluid wave.  She has no palpable liver or spleen tip.  Extremities show no clubbing, cyanosis or edema.  Neurological exam is nonfocal.  If it was not for this nausea, I think she could go home.  Again I am not sure what she has is nausea.  She had a urinalysis yesterday which was unremarkable.  As such I do not think there is a UTI.  I do not know if she may be developing an ileus.  We will see what an abdominal x-ray looks like.  Hopefully, she will still be able to go home in a day or so.  We will see what her labs look like today.  I do appreciate everybody's help on 6 E.   Jeralyn Crease, MD  [place] 1:5

## 2024-03-07 NOTE — Progress Notes (Signed)
 Patient is unable to go home as there is no ride and no caregiver at home. Patient's family would like to take her at 900 am tomorrow morning. Home health is set up. Discussed with patient's son over phone.

## 2024-03-07 NOTE — Plan of Care (Signed)

## 2024-03-08 ENCOUNTER — Other Ambulatory Visit (HOSPITAL_COMMUNITY): Payer: Self-pay

## 2024-03-08 ENCOUNTER — Other Ambulatory Visit: Payer: Self-pay

## 2024-03-08 DIAGNOSIS — K591 Functional diarrhea: Secondary | ICD-10-CM | POA: Diagnosis not present

## 2024-03-08 DIAGNOSIS — C20 Malignant neoplasm of rectum: Secondary | ICD-10-CM | POA: Diagnosis not present

## 2024-03-08 MED ORDER — HEPARIN SOD (PORK) LOCK FLUSH 100 UNIT/ML IV SOLN
500.0000 [IU] | Freq: Once | INTRAVENOUS | Status: AC
  Administered 2024-03-08: 500 [IU] via INTRAVENOUS
  Filled 2024-03-08: qty 5

## 2024-03-08 NOTE — Progress Notes (Signed)
 She looks better this morning.  She is can go home today.  There is no labs yet today.  Her potassium is 3.5.  She really cannot take oral potassium.  As such, I would not put her on any oral potassium at home.  Her abdominal x-ray looks better.  There is no air-fluid levels.  There is no bowel distention.  She has a little bit of nausea.  She did eat a little bit better yesterday.  She has had no fever.  There has been no bleeding.  Her urine culture showed multiple species.  As such, this probably was not a good specimen.  Her vital signs are all stable.  Temperature 98.5.  Pulse 62.  Blood pressure 130/51.  Head and neck exam shows no ocular or oral lesions.  She has no scleral icterus.  There is no adenopathy in the neck.  Lungs are clear bilaterally.  Cardiac exam regular rate and rhythm.  There are no murmurs.  Abdomen is not as distended.  Bowel sounds are a little bit better.  There is no guarding or rebound tenderness.  There is no fluid wave.  There is no palpable liver or spleen tip.  Extremity shows no clubbing, cyanosis or edema.  Neurological exam shows no focal neurological deficits.  Again, the diarrhea has resolved.  She is doing well.  I do not see a problem with her going home.  We will follow-up with her in the office.  Please make sure that the Port-A-Cath is deaccessed.  Jeralyn Crease, MD

## 2024-03-08 NOTE — Discharge Summary (Signed)
 Physician Discharge Summary   Patient: [name] MRN: [id number] DOB: 09-10-1929  Admit date:     02/26/2024  Discharge date: 03/08/24  Discharge Physician: Elsie JAYSON Montclair   PCP: Onita Rush, MD   Recommendations at discharge:    PCP follow up in 1 week. Oncology follow up as scheduled.  Discharge Diagnoses: Principal Problem:   Diarrhea Active Problems:   Hyponatremia   Hypokalemia   Rectal cancer (HCC)   Paroxysmal atrial fibrillation (HCC)   Generalized weakness  Resolved Problems:   * No resolved hospital problems. [place] [facility] Course: [name] is a 89 y.o. female with medical history significant of aortic stenosis, type 2 diabetes, hypertension, hyperlipidemia, colon cancer who presents emergency department due to diarrhea.  Patient was with oncology for adenocarcinoma of the rectum.  She is currently on pembrolizumab  and Xeloda .  She has been having longstanding issues with diarrhea since last Friday.  She has had numerous episodes.  She was given Lomotil  and her stools slowed significantly.  She had a flexible sigmoidoscopy with Dr. Charlanne 2 months ago which showed worsening of her masses.  She has not had biopsies since her diarrhea. Patient is admitted to TRH service for further management and evaluation of severe diarrhea.   Assessment and Plan: Severe diarrhea Dehydration Rectal adenocarcinoma H/o chronic diarrhea has been managed with lomotil / imodium . GI panel, c diff studies negative. Dr. Timmy follow up appreciated. She did get somatostatin, creon , cholestyramine  while inpatient. Repeat abdominal xray non obstructive bowel gas pattern. Continue imodium  PRN. Stools frequency and consistency improved.  Advised to continue pain control, antiemetics, kcl supplements at home. Advised PCP follow up, oncology follow up as scheduled.   Hypokalemia- Due to GI losses. Improved with IV supplements. Oral kcl supplements daily.   Hyponatremia- Due to  hypovolemia from diarrhea. She is eating fair. Na stable.   Anxiety and depression- Continue xanax , lexapro .   Paroxysmal Afib- continue amiodarone .   Hypothyroidism - on Levothyroxine .   GERD -On PPI.       Consultants: Oncology Procedures performed: none  Disposition: Home health Diet recommendation:  Discharge Diet Orders (From admission, onward)     Start     Ordered   03/07/24 0000  Diet - low sodium heart healthy        03/07/24 1429           Cardiac diet DISCHARGE MEDICATION: Allergies as of 03/08/2024       Reactions   Aggrenox  [aspirin -dipyridamole  Er] Other (See Comments)   Severe Bleeding and Stomach Pain   Prednisone  Other (See Comments)   Climbs the walls        Medication List     STOP taking these medications    pantoprazole  20 MG tablet Commonly known as: Protonix    triamterene -hydrochlorothiazide  37.5-25 MG tablet Commonly known as: MAXZIDE-25       TAKE these medications    acetaminophen  325 MG tablet Commonly known as: TYLENOL  Take 2 tablets (650 mg total) by mouth every 6 (six) hours as needed for mild pain (or Fever >/= 101). What changed: reasons to take this   ALPRAZolam  0.5 MG tablet Commonly known as: XANAX  Take 0.5 mg by mouth 2 (two) times daily. For anxiety   amiodarone  200 MG tablet Commonly known as: PACERONE  Take 0.5 tablets (100 mg total) by mouth daily.   amLODipine  5 MG tablet Commonly known as: NORVASC  Take 5 mg by mouth in the morning and at bedtime. What changed: Another  medication with the same name was removed. Continue taking this medication, and follow the directions you see here.   cholecalciferol 25 MCG (1000 UNIT) tablet Commonly known as: VITAMIN D3 Take 1,000 Units by mouth daily.   clotrimazole  1 % cream Commonly known as: LOTRIMIN  Apply 1 Application topically 2 (two) times daily.   cyanocobalamin  1000 MCG tablet Take 1 tablet (1,000 mcg total) by mouth daily.    diphenoxylate -atropine  2.5-0.025 MG tablet Commonly known as: LOMOTIL  Take 1 tablet by mouth 4 (four) times daily as needed for diarrhea or loose stools. Take 1-2 tabs PO bid-qid prn; Max: 8 tabs/day; Info: reduce dose when sx controlled; D/C after 10 days if no improvement What changed:  how much to take when to take this additional instructions   escitalopram  5 MG tablet Commonly known as: LEXAPRO  Take 5 mg by mouth at bedtime.   fluticasone  50 MCG/ACT nasal spray Commonly known as: FLONASE  Place 2 sprays into both nostrils daily. What changed:  when to take this reasons to take this   hydrALAZINE  25 MG tablet Commonly known as: APRESOLINE  Take 1 tablet (25 mg total) by mouth 2 (two) times daily.   levothyroxine  175 MCG tablet Commonly known as: SYNTHROID  Take 175 mcg by mouth every morning.   lidocaine -prilocaine  cream Commonly known as: EMLA  Apply to affected area once What changed:  how much to take how to take this when to take this reasons to take this additional instructions   liver oil-zinc  oxide 40 % ointment Commonly known as: DESITIN Apply topically as needed for irritation.   loperamide  2 MG tablet Commonly known as: IMODIUM  A-D Take 2 mg by mouth in the morning, at noon, in the evening, and at bedtime.   loratadine  10 MG tablet Commonly known as: CLARITIN  Take 1 tablet (10 mg total) by mouth daily.   nystatin -triamcinolone  ointment Commonly known as: MYCOLOG Apply 1 Application topically 2 (two) times daily.   ondansetron  8 MG tablet Commonly known as: Zofran  Take 1 tablet (8 mg total) by mouth every 8 (eight) hours as needed for nausea or vomiting.   oxyCODONE -acetaminophen  5-325 MG tablet Commonly known as: PERCOCET/ROXICET Take 1 tablet by mouth every 6 (six) hours as needed for moderate pain (pain score 4-6) or severe pain (pain score 7-10).   Pepcid  20 MG tablet Generic drug: famotidine  Take 20 mg by mouth at bedtime.   potassium  chloride SA 20 MEQ tablet Commonly known as: KLOR-CON  M Take 1 tablet (20 mEq total) by mouth at bedtime.   prochlorperazine  10 MG tablet Commonly known as: COMPAZINE  Take 1 tablet (10 mg total) by mouth every 6 (six) hours as needed.   triamcinolone  0.025 % ointment Commonly known as: KENALOG  Apply 1 Application topically 2 (two) times daily.               Discharge Care Instructions  (From admission, onward)           Start     Ordered   03/07/24 0000  No dressing needed        03/07/24 1429            Follow-up Information     [facility] Follow up.   Contact information: [address] [place] [address] [phone]                Discharge Exam: Filed Weights   02/26/24 1752  Weight: 76 kg      03/08/2024  4:59 AM 03/07/2024    8:37 PM 03/07/2024    1:51 PM  Vitals with BMI  Systolic 130 155 834  Diastolic 51 65 71  Pulse 62 76 78    General - Elderly Caucasian female, no abdominal discomfort HEENT - PERRLA, EOMI, atraumatic head, non tender sinuses. Lung - Clear, basal rales, rhonchi, wheezes. Heart - S1, S2 heard, no murmurs, rubs, trace pedal edema. Abdomen - Soft, non tender, non distended, bowel sounds poor Neuro - Alert, awake and oriented x 3, flexi seal out. Skin - Warm and dry.   Condition at discharge: stable  The results of significant diagnostics from this hospitalization (including imaging, microbiology, ancillary and laboratory) are listed below for reference.   Imaging Studies: DG Abd 2 Views Result Date: 03/07/2024 CLINICAL DATA:  Follow-up small bowel obstruction. EXAM: ABDOMEN - 2 VIEW COMPARISON:  03/03/2024 FINDINGS: No dilated scratch interval improvement in air-filled loops of small bowel which measure up to 2.5 cm within the right lower quadrant of the abdomen. Similar amount of colonic gas. Right upper quadrant cholecystectomy clips. IMPRESSION: Interval improvement in  air-filled loops of small bowel compatible with resolving small-bowel obstruction. Electronically Signed   By: Waddell Calk M.D.   On: 03/07/2024 11:23   DG Abd 2 Views Result Date: 03/03/2024 CLINICAL DATA:  Diarrhea. EXAM: ABDOMEN - 2 VIEW COMPARISON:  Radiograph dated 02/29/2024. FINDINGS: No bowel dilatation or evidence of obstruction. No free air a calculi. Right upper quadrant cholecystectomy clips. Osteopenia with degenerative changes of the spine. No acute osseous pathology. IMPRESSION: Nonobstructive bowel gas pattern. Electronically Signed   By: Vanetta Chou M.D.   On: 03/03/2024 11:57   DG Abd 2 Views Result Date: 02/29/2024 CLINICAL DATA:  Diarrhea. EXAM: ABDOMEN - 2 VIEW COMPARISON:  CT 02/26/2024 FINDINGS: 2 supine views demonstrate gas within normal caliber colon. Gas-filled proximal small bowel loops of up to 4.5 cm. Cholecystectomy. No gross free intraperitoneal air. IMPRESSION: Gas-filled small bowel loops of up to 4.5 cm may represent ileus or low-grade partial small bowel obstruction. Electronically Signed   By: Rockey Kilts M.D.   On: 02/29/2024 12:13   CT ABDOMEN PELVIS W CONTRAST Result Date: 02/26/2024 CLINICAL DATA:  Abdominal pain, diarrhea, colon cancer currently undergoing chemotherapy and radiation therapy EXAM: CT ABDOMEN AND PELVIS WITH CONTRAST TECHNIQUE: Multidetector CT imaging of the abdomen and pelvis was performed using the standard protocol following bolus administration of intravenous contrast. RADIATION DOSE REDUCTION: This exam was performed according to the departmental dose-optimization program which includes automated exposure control, adjustment of the mA and/or kV according to patient size and/or use of iterative reconstruction technique. CONTRAST:  OMNIPAQUE  IOHEXOL  300 MG/ML  SOLN COMPARISON:  01/10/2024, 01/17/2023 FINDINGS: Lower chest: No acute pleural or parenchymal lung disease. Hepatobiliary: No focal liver abnormality is seen. Status post  cholecystectomy. No biliary dilatation. Pancreas: Unremarkable. No pancreatic ductal dilatation or surrounding inflammatory changes. Spleen: Normal in size without focal abnormality. Adrenals/Urinary Tract: Adrenal glands are unremarkable. Kidneys are normal, without renal calculi, focal lesion, or hydronephrosis. Bladder is unremarkable. Stomach/Bowel: Circumferential rectal wall thickening again noted, compatible with known history of rectal cancer. No bowel obstruction or ileus. Gas fluid levels are seen throughout the colon, compatible with given history of diarrhea. No acute inflammatory changes. Small hiatal hernia. Vascular/Lymphatic: Aortic atherosclerosis. 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 No pathologic adenopathy. Reproductive: Uterus and bilateral adnexa are unremarkable. Other: No free fluid or free intraperitoneal gas. No abdominal wall hernia. Musculoskeletal: No acute or destructive bony abnormalities. Reconstructed images demonstrate no additional findings. IMPRESSION: 1. Stable circumferential rectal wall thickening consistent with known history of rectal cancer. 2. Gas fluid levels throughout the colon consistent with diarrhea. No bowel obstruction or ileus. 3. Aortic Atherosclerosis (ICD10-I70.0). Stable high-grade stenoses within the bilateral common iliac arteries, right greater than left. 4.  Aortic Atherosclerosis (ICD10-I70.0). 5. Small hiatal hernia. Electronically Signed   By: Ozell Daring M.D.   On: 02/26/2024 21:34    Microbiology: Results for orders placed or performed during the hospital encounter of 02/26/24  Urine Culture     Status: Abnormal   Collection Time: 03/06/24  2:44 PM   Specimen: Urine, Clean Catch  Result Value Ref Range Status   Specimen Description   Final    URINE, CLEAN  CATCH Performed at [facility], [address] [place], [place] [address]    Special Requests   Final    NONE Reflexed from [id number] Performed at [facility], [address], [place], [place] [address]    Culture MULTIPLE SPECIES PRESENT, SUGGEST RECOLLECTION (A)  Final   Report Status 03/07/2024 FINAL  Final    Labs: CBC: Recent Labs  Lab 03/02/24 0509 03/03/24 0500 03/05/24 0500 03/06/24 0548 03/07/24 1033  WBC 5.0 5.5 5.1 3.4* 3.8*  NEUTROABS 3.4 3.6 3.3 1.9 2.2  HGB 10.7* 10.0* 10.9* 10.9* 11.5*  HCT 34.5* 32.4* 34.2* 34.4* 36.2  MCV 98.3 97.6 93.4 95.8 96.0  PLT 162 153 167 177 204   Basic Metabolic Panel: Recent Labs  Lab 03/02/24 0509 03/03/24 0500 03/05/24 0500 03/06/24 0548 03/07/24 1033  NA 136 137 131* 137 134*  K 3.7 4.2 3.7 3.1* 3.5  CL 106 107 91* 93* 93*  CO2 23 24 31  34* 31  GLUCOSE 144* 113* 134* 115* 165*  BUN 8 13 11 11 11   CREATININE 0.88 0.82 0.64 0.62 0.72  CALCIUM  8.2* 8.2* 8.0* 8.4* 8.7*  MG 1.8 1.7 1.7 1.9 1.9   Liver Function Tests: Recent Labs  Lab 03/02/24 0509 03/03/24 0500 03/05/24 0500 03/06/24 0548 03/07/24 1033  AST 14* 14* 16 20 23   ALT 18 17 19 20 25   ALKPHOS 62 57 70 67 77  BILITOT 0.8 0.5 0.5 0.6 0.4  PROT 5.0* 4.7* 5.5* 5.1* 5.6*  ALBUMIN 2.5* 2.3* 2.5* 2.4* 2.8*   CBG: No results for input(s): GLUCAP in the last 168 hours.  Discharge time spent: 34 minutes.  Signed: Elsie Montclair DO [facility] 03/08/2024

## 2024-03-09 ENCOUNTER — Telehealth: Payer: Self-pay

## 2024-03-09 NOTE — Telephone Encounter (Signed)
 Call from patient's son to answer questions on Saturday night about right before 11.  Report patient was discharged today. She was admitted for over a week for diarrhea. He report mother had some increase urinary frequency. Nurse triage recommended UC evaluation if persistent. Son was not satisfied and needed to speak to a doctor. Son reports some increase frequency but no pain, fever, chills or other symptoms to suggest UTI. He ask if any medication could cause it. I reviewed her medication list and not noted any particular medication could have cause this. He denies using any diuretics.  He also ask about potassium. If she should be taking since it was borderline low. I asked if there is any more diarrhea. He says no.  Advised of Dr. Jessy recommendation of not using it at home. He understands. Report his mother is sleep well currently. Advised monitor for s/o UTI. If she wakes up with persistent urinary frequency, dysuria, burning, fever, chills, report to ED. Will let Dr. Timmy know. He understands.

## 2024-03-10 ENCOUNTER — Ambulatory Visit

## 2024-03-10 ENCOUNTER — Other Ambulatory Visit (HOSPITAL_COMMUNITY): Payer: Self-pay

## 2024-03-10 ENCOUNTER — Encounter: Payer: Self-pay | Admitting: Hematology & Oncology

## 2024-03-11 ENCOUNTER — Ambulatory Visit

## 2024-03-12 ENCOUNTER — Ambulatory Visit

## 2024-03-13 ENCOUNTER — Ambulatory Visit

## 2024-03-14 ENCOUNTER — Ambulatory Visit

## 2024-03-17 ENCOUNTER — Ambulatory Visit

## 2024-03-17 ENCOUNTER — Other Ambulatory Visit: Payer: Self-pay

## 2024-03-17 ENCOUNTER — Encounter: Payer: Self-pay | Admitting: Hematology & Oncology

## 2024-03-17 ENCOUNTER — Ambulatory Visit
Admission: RE | Admit: 2024-03-17 | Discharge: 2024-03-17 | Disposition: A | Discharge status: Home / Self Care | Source: Ambulatory Visit | Attending: Radiation Oncology | Admitting: Radiation Oncology

## 2024-03-17 DIAGNOSIS — K922 Gastrointestinal hemorrhage, unspecified: Secondary | ICD-10-CM | POA: Diagnosis not present

## 2024-03-17 DIAGNOSIS — D500 Iron deficiency anemia secondary to blood loss (chronic): Secondary | ICD-10-CM | POA: Insufficient documentation

## 2024-03-17 DIAGNOSIS — K31819 Angiodysplasia of stomach and duodenum without bleeding: Secondary | ICD-10-CM | POA: Insufficient documentation

## 2024-03-17 DIAGNOSIS — K5520 Angiodysplasia of colon without hemorrhage: Secondary | ICD-10-CM | POA: Insufficient documentation

## 2024-03-17 DIAGNOSIS — R197 Diarrhea, unspecified: Secondary | ICD-10-CM | POA: Diagnosis not present

## 2024-03-17 DIAGNOSIS — I495 Sick sinus syndrome: Secondary | ICD-10-CM | POA: Diagnosis not present

## 2024-03-17 DIAGNOSIS — E860 Dehydration: Secondary | ICD-10-CM | POA: Diagnosis not present

## 2024-03-17 DIAGNOSIS — K859 Acute pancreatitis without necrosis or infection, unspecified: Secondary | ICD-10-CM | POA: Insufficient documentation

## 2024-03-17 DIAGNOSIS — E440 Moderate protein-calorie malnutrition: Secondary | ICD-10-CM | POA: Insufficient documentation

## 2024-03-17 DIAGNOSIS — I350 Nonrheumatic aortic (valve) stenosis: Secondary | ICD-10-CM | POA: Insufficient documentation

## 2024-03-17 DIAGNOSIS — E871 Hypo-osmolality and hyponatremia: Secondary | ICD-10-CM | POA: Diagnosis not present

## 2024-03-17 DIAGNOSIS — C20 Malignant neoplasm of rectum: Secondary | ICD-10-CM | POA: Insufficient documentation

## 2024-03-17 DIAGNOSIS — N3000 Acute cystitis without hematuria: Secondary | ICD-10-CM | POA: Insufficient documentation

## 2024-03-17 DIAGNOSIS — D649 Anemia, unspecified: Secondary | ICD-10-CM | POA: Insufficient documentation

## 2024-03-17 DIAGNOSIS — D509 Iron deficiency anemia, unspecified: Secondary | ICD-10-CM | POA: Insufficient documentation

## 2024-03-17 DIAGNOSIS — K621 Rectal polyp: Secondary | ICD-10-CM | POA: Diagnosis not present

## 2024-03-17 DIAGNOSIS — R195 Other fecal abnormalities: Secondary | ICD-10-CM | POA: Insufficient documentation

## 2024-03-17 DIAGNOSIS — K429 Umbilical hernia without obstruction or gangrene: Secondary | ICD-10-CM | POA: Diagnosis not present

## 2024-03-17 DIAGNOSIS — I480 Paroxysmal atrial fibrillation: Secondary | ICD-10-CM | POA: Insufficient documentation

## 2024-03-17 DIAGNOSIS — I639 Cerebral infarction, unspecified: Secondary | ICD-10-CM | POA: Insufficient documentation

## 2024-03-17 DIAGNOSIS — M79675 Pain in left toe(s): Secondary | ICD-10-CM | POA: Insufficient documentation

## 2024-03-17 DIAGNOSIS — T451X5A Adverse effect of antineoplastic and immunosuppressive drugs, initial encounter: Secondary | ICD-10-CM | POA: Insufficient documentation

## 2024-03-17 DIAGNOSIS — M79674 Pain in right toe(s): Secondary | ICD-10-CM | POA: Insufficient documentation

## 2024-03-17 DIAGNOSIS — F419 Anxiety disorder, unspecified: Secondary | ICD-10-CM | POA: Diagnosis not present

## 2024-03-17 DIAGNOSIS — E119 Type 2 diabetes mellitus without complications: Secondary | ICD-10-CM | POA: Insufficient documentation

## 2024-03-17 DIAGNOSIS — Z9221 Personal history of antineoplastic chemotherapy: Secondary | ICD-10-CM | POA: Insufficient documentation

## 2024-03-17 DIAGNOSIS — E039 Hypothyroidism, unspecified: Secondary | ICD-10-CM | POA: Insufficient documentation

## 2024-03-17 DIAGNOSIS — Z79890 Hormone replacement therapy (postmenopausal): Secondary | ICD-10-CM | POA: Insufficient documentation

## 2024-03-17 DIAGNOSIS — Z95828 Presence of other vascular implants and grafts: Secondary | ICD-10-CM | POA: Insufficient documentation

## 2024-03-17 DIAGNOSIS — J189 Pneumonia, unspecified organism: Secondary | ICD-10-CM | POA: Insufficient documentation

## 2024-03-17 DIAGNOSIS — R7401 Elevation of levels of liver transaminase levels: Secondary | ICD-10-CM | POA: Insufficient documentation

## 2024-03-17 DIAGNOSIS — Z860100 Personal history of colon polyps, unspecified: Secondary | ICD-10-CM | POA: Insufficient documentation

## 2024-03-17 DIAGNOSIS — R6889 Other general symptoms and signs: Secondary | ICD-10-CM | POA: Insufficient documentation

## 2024-03-17 DIAGNOSIS — E785 Hyperlipidemia, unspecified: Secondary | ICD-10-CM | POA: Insufficient documentation

## 2024-03-17 DIAGNOSIS — R062 Wheezing: Secondary | ICD-10-CM | POA: Insufficient documentation

## 2024-03-17 DIAGNOSIS — B351 Tinea unguium: Secondary | ICD-10-CM | POA: Insufficient documentation

## 2024-03-17 DIAGNOSIS — R079 Chest pain, unspecified: Secondary | ICD-10-CM | POA: Insufficient documentation

## 2024-03-17 DIAGNOSIS — E876 Hypokalemia: Secondary | ICD-10-CM | POA: Insufficient documentation

## 2024-03-17 DIAGNOSIS — I10 Essential (primary) hypertension: Secondary | ICD-10-CM | POA: Insufficient documentation

## 2024-03-17 DIAGNOSIS — Z79899 Other long term (current) drug therapy: Secondary | ICD-10-CM | POA: Insufficient documentation

## 2024-03-17 DIAGNOSIS — Z923 Personal history of irradiation: Secondary | ICD-10-CM | POA: Insufficient documentation

## 2024-03-17 DIAGNOSIS — K625 Hemorrhage of anus and rectum: Secondary | ICD-10-CM | POA: Diagnosis present

## 2024-03-17 DIAGNOSIS — Z7901 Long term (current) use of anticoagulants: Secondary | ICD-10-CM | POA: Insufficient documentation

## 2024-03-17 DIAGNOSIS — K521 Toxic gastroenteritis and colitis: Secondary | ICD-10-CM | POA: Insufficient documentation

## 2024-03-17 DIAGNOSIS — Z510 Encounter for antineoplastic radiation therapy: Secondary | ICD-10-CM | POA: Diagnosis not present

## 2024-03-17 DIAGNOSIS — R531 Weakness: Secondary | ICD-10-CM | POA: Insufficient documentation

## 2024-03-17 DIAGNOSIS — J9601 Acute respiratory failure with hypoxia: Secondary | ICD-10-CM | POA: Diagnosis not present

## 2024-03-17 DIAGNOSIS — I2510 Atherosclerotic heart disease of native coronary artery without angina pectoris: Secondary | ICD-10-CM | POA: Insufficient documentation

## 2024-03-17 DIAGNOSIS — R911 Solitary pulmonary nodule: Secondary | ICD-10-CM | POA: Insufficient documentation

## 2024-03-17 DIAGNOSIS — R5383 Other fatigue: Secondary | ICD-10-CM | POA: Insufficient documentation

## 2024-03-17 DIAGNOSIS — Z1509 Genetic susceptibility to other malignant neoplasm: Secondary | ICD-10-CM | POA: Insufficient documentation

## 2024-03-17 LAB — RAD ONC ARIA SESSION SUMMARY
Course Elapsed Days: 40
Plan Fractions Treated to Date: 14
Plan Prescribed Dose Per Fraction: 1.8 Gy
Plan Total Fractions Prescribed: 25
Plan Total Prescribed Dose: 45 Gy
Reference Point Dosage Given to Date: 25.2 Gy
Reference Point Session Dosage Given: 1.8 Gy
Session Number: 14

## 2024-03-18 ENCOUNTER — Ambulatory Visit

## 2024-03-18 ENCOUNTER — Ambulatory Visit
Admission: RE | Admit: 2024-03-18 | Discharge: 2024-03-18 | Disposition: A | Discharge status: Home / Self Care | Source: Ambulatory Visit | Attending: Radiation Oncology | Admitting: Radiation Oncology

## 2024-03-18 ENCOUNTER — Other Ambulatory Visit: Payer: Self-pay

## 2024-03-18 DIAGNOSIS — C20 Malignant neoplasm of rectum: Secondary | ICD-10-CM | POA: Diagnosis not present

## 2024-03-18 LAB — RAD ONC ARIA SESSION SUMMARY
Course Elapsed Days: 41
Plan Fractions Treated to Date: 15
Plan Prescribed Dose Per Fraction: 1.8 Gy
Plan Total Fractions Prescribed: 25
Plan Total Prescribed Dose: 45 Gy
Reference Point Dosage Given to Date: 27 Gy
Reference Point Session Dosage Given: 1.8 Gy
Session Number: 15

## 2024-03-19 ENCOUNTER — Ambulatory Visit

## 2024-03-19 ENCOUNTER — Other Ambulatory Visit: Payer: Self-pay

## 2024-03-19 ENCOUNTER — Ambulatory Visit
Admission: RE | Admit: 2024-03-19 | Discharge: 2024-03-19 | Disposition: A | Discharge status: Home / Self Care | Source: Ambulatory Visit | Attending: Radiation Oncology | Admitting: Radiation Oncology

## 2024-03-19 DIAGNOSIS — C20 Malignant neoplasm of rectum: Secondary | ICD-10-CM | POA: Diagnosis not present

## 2024-03-19 LAB — RAD ONC ARIA SESSION SUMMARY
Course Elapsed Days: 42
Plan Fractions Treated to Date: 16
Plan Prescribed Dose Per Fraction: 1.8 Gy
Plan Total Fractions Prescribed: 25
Plan Total Prescribed Dose: 45 Gy
Reference Point Dosage Given to Date: 28.8 Gy
Reference Point Session Dosage Given: 1.8 Gy
Session Number: 16

## 2024-03-20 ENCOUNTER — Other Ambulatory Visit: Payer: Self-pay

## 2024-03-20 ENCOUNTER — Ambulatory Visit
Admission: RE | Admit: 2024-03-20 | Discharge: 2024-03-20 | Disposition: A | Discharge status: Home / Self Care | Source: Ambulatory Visit | Attending: Radiation Oncology | Admitting: Radiation Oncology

## 2024-03-20 ENCOUNTER — Ambulatory Visit

## 2024-03-20 DIAGNOSIS — C20 Malignant neoplasm of rectum: Secondary | ICD-10-CM | POA: Diagnosis not present

## 2024-03-20 LAB — RAD ONC ARIA SESSION SUMMARY
Course Elapsed Days: 43
Plan Fractions Treated to Date: 17
Plan Prescribed Dose Per Fraction: 1.8 Gy
Plan Total Fractions Prescribed: 25
Plan Total Prescribed Dose: 45 Gy
Reference Point Dosage Given to Date: 30.6 Gy
Reference Point Session Dosage Given: 1.8 Gy
Session Number: 17

## 2024-03-21 ENCOUNTER — Encounter

## 2024-03-21 ENCOUNTER — Ambulatory Visit

## 2024-03-21 ENCOUNTER — Other Ambulatory Visit: Payer: Self-pay

## 2024-03-21 ENCOUNTER — Ambulatory Visit
Admission: RE | Admit: 2024-03-21 | Discharge: 2024-03-21 | Disposition: A | Discharge status: Home / Self Care | Payer: Self-pay | Source: Ambulatory Visit | Attending: Radiation Oncology | Admitting: Radiation Oncology

## 2024-03-21 DIAGNOSIS — C20 Malignant neoplasm of rectum: Secondary | ICD-10-CM | POA: Diagnosis not present

## 2024-03-21 LAB — RAD ONC ARIA SESSION SUMMARY
Course Elapsed Days: 44
Plan Fractions Treated to Date: 18
Plan Prescribed Dose Per Fraction: 1.8 Gy
Plan Total Fractions Prescribed: 25
Plan Total Prescribed Dose: 45 Gy
Reference Point Dosage Given to Date: 32.4 Gy
Reference Point Session Dosage Given: 1.8 Gy
Session Number: 18

## 2024-03-24 ENCOUNTER — Encounter

## 2024-03-24 ENCOUNTER — Other Ambulatory Visit: Payer: Self-pay

## 2024-03-24 ENCOUNTER — Inpatient Hospital Stay
Admission: RE | Admit: 2024-03-24 | Discharge: 2024-03-24 | Disposition: A | Discharge status: Home / Self Care | Payer: Self-pay | Source: Ambulatory Visit | Attending: Radiation Oncology | Admitting: Radiation Oncology

## 2024-03-24 ENCOUNTER — Ambulatory Visit

## 2024-03-24 DIAGNOSIS — C20 Malignant neoplasm of rectum: Secondary | ICD-10-CM | POA: Diagnosis not present

## 2024-03-24 LAB — RAD ONC ARIA SESSION SUMMARY
Course Elapsed Days: 47
Plan Fractions Treated to Date: 19
Plan Prescribed Dose Per Fraction: 1.8 Gy
Plan Total Fractions Prescribed: 25
Plan Total Prescribed Dose: 45 Gy
Reference Point Dosage Given to Date: 34.2 Gy
Reference Point Session Dosage Given: 1.8 Gy
Session Number: 19

## 2024-03-25 ENCOUNTER — Other Ambulatory Visit: Payer: Self-pay

## 2024-03-25 ENCOUNTER — Ambulatory Visit
Admission: RE | Admit: 2024-03-25 | Discharge: 2024-03-25 | Disposition: A | Discharge status: Home / Self Care | Source: Ambulatory Visit | Attending: Radiation Oncology | Admitting: Radiation Oncology

## 2024-03-25 ENCOUNTER — Encounter

## 2024-03-25 ENCOUNTER — Inpatient Hospital Stay
Admission: RE | Admit: 2024-03-25 | Discharge: 2024-03-25 | Disposition: A | Discharge status: Home / Self Care | Payer: Self-pay | Source: Ambulatory Visit | Attending: Radiation Oncology | Admitting: Radiation Oncology

## 2024-03-25 DIAGNOSIS — C20 Malignant neoplasm of rectum: Secondary | ICD-10-CM | POA: Diagnosis not present

## 2024-03-25 LAB — RAD ONC ARIA SESSION SUMMARY
Course Elapsed Days: 48
Plan Fractions Treated to Date: 20
Plan Prescribed Dose Per Fraction: 1.8 Gy
Plan Total Fractions Prescribed: 25
Plan Total Prescribed Dose: 45 Gy
Reference Point Dosage Given to Date: 36 Gy
Reference Point Session Dosage Given: 1.8 Gy
Session Number: 20

## 2024-03-26 ENCOUNTER — Other Ambulatory Visit: Payer: Self-pay

## 2024-03-26 ENCOUNTER — Inpatient Hospital Stay
Admission: RE | Admit: 2024-03-26 | Discharge: 2024-03-26 | Disposition: A | Discharge status: Home / Self Care | Payer: Self-pay | Source: Ambulatory Visit | Attending: Radiation Oncology | Admitting: Radiation Oncology

## 2024-03-26 DIAGNOSIS — C20 Malignant neoplasm of rectum: Secondary | ICD-10-CM | POA: Diagnosis not present

## 2024-03-26 LAB — RAD ONC ARIA SESSION SUMMARY
Course Elapsed Days: 49
Plan Fractions Treated to Date: 21
Plan Prescribed Dose Per Fraction: 1.8 Gy
Plan Total Fractions Prescribed: 25
Plan Total Prescribed Dose: 45 Gy
Reference Point Dosage Given to Date: 37.8 Gy
Reference Point Session Dosage Given: 1.8 Gy
Session Number: 21

## 2024-03-27 ENCOUNTER — Encounter

## 2024-03-27 ENCOUNTER — Other Ambulatory Visit: Payer: Self-pay

## 2024-03-27 ENCOUNTER — Inpatient Hospital Stay
Admission: RE | Admit: 2024-03-27 | Discharge: 2024-03-27 | Disposition: A | Discharge status: Home / Self Care | Payer: Self-pay | Source: Ambulatory Visit | Attending: Radiation Oncology | Admitting: Radiation Oncology

## 2024-03-27 DIAGNOSIS — C20 Malignant neoplasm of rectum: Secondary | ICD-10-CM | POA: Diagnosis not present

## 2024-03-27 LAB — RAD ONC ARIA SESSION SUMMARY
Course Elapsed Days: 50
Plan Fractions Treated to Date: 22
Plan Prescribed Dose Per Fraction: 1.8 Gy
Plan Total Fractions Prescribed: 25
Plan Total Prescribed Dose: 45 Gy
Reference Point Dosage Given to Date: 39.6 Gy
Reference Point Session Dosage Given: 1.8 Gy
Session Number: 22

## 2024-03-28 ENCOUNTER — Other Ambulatory Visit: Payer: Self-pay

## 2024-03-28 ENCOUNTER — Encounter

## 2024-03-28 ENCOUNTER — Inpatient Hospital Stay
Admission: RE | Admit: 2024-03-28 | Discharge: 2024-03-28 | Disposition: A | Discharge status: Home / Self Care | Payer: Self-pay | Source: Ambulatory Visit | Attending: Radiation Oncology | Admitting: Radiation Oncology

## 2024-03-28 DIAGNOSIS — C20 Malignant neoplasm of rectum: Secondary | ICD-10-CM | POA: Diagnosis not present

## 2024-03-28 LAB — RAD ONC ARIA SESSION SUMMARY
Course Elapsed Days: 51
Plan Fractions Treated to Date: 23
Plan Prescribed Dose Per Fraction: 1.8 Gy
Plan Total Fractions Prescribed: 25
Plan Total Prescribed Dose: 45 Gy
Reference Point Dosage Given to Date: 41.4 Gy
Reference Point Session Dosage Given: 1.8 Gy
Session Number: 23

## 2024-03-31 ENCOUNTER — Inpatient Hospital Stay
Admission: RE | Admit: 2024-03-31 | Discharge: 2024-03-31 | Disposition: A | Discharge status: Home / Self Care | Payer: Self-pay | Source: Ambulatory Visit | Attending: Radiation Oncology | Admitting: Radiation Oncology

## 2024-03-31 ENCOUNTER — Encounter

## 2024-03-31 ENCOUNTER — Other Ambulatory Visit: Payer: Self-pay

## 2024-03-31 DIAGNOSIS — C20 Malignant neoplasm of rectum: Secondary | ICD-10-CM | POA: Diagnosis not present

## 2024-03-31 LAB — RAD ONC ARIA SESSION SUMMARY
Course Elapsed Days: 54
Plan Fractions Treated to Date: 24
Plan Prescribed Dose Per Fraction: 1.8 Gy
Plan Total Fractions Prescribed: 25
Plan Total Prescribed Dose: 45 Gy
Reference Point Dosage Given to Date: 43.2 Gy
Reference Point Session Dosage Given: 1.8 Gy
Session Number: 24

## 2024-04-01 ENCOUNTER — Ambulatory Visit
Admission: RE | Admit: 2024-04-01 | Discharge: 2024-04-01 | Disposition: A | Discharge status: Home / Self Care | Source: Ambulatory Visit | Attending: Radiation Oncology | Admitting: Radiation Oncology

## 2024-04-01 ENCOUNTER — Inpatient Hospital Stay
Admission: RE | Admit: 2024-04-01 | Discharge: 2024-04-01 | Disposition: A | Discharge status: Home / Self Care | Payer: Self-pay | Source: Ambulatory Visit | Attending: Radiation Oncology | Admitting: Radiation Oncology

## 2024-04-01 ENCOUNTER — Other Ambulatory Visit: Payer: Self-pay

## 2024-04-01 DIAGNOSIS — C20 Malignant neoplasm of rectum: Secondary | ICD-10-CM | POA: Insufficient documentation

## 2024-04-01 DIAGNOSIS — Z79890 Hormone replacement therapy (postmenopausal): Secondary | ICD-10-CM | POA: Insufficient documentation

## 2024-04-01 DIAGNOSIS — R197 Diarrhea, unspecified: Secondary | ICD-10-CM | POA: Insufficient documentation

## 2024-04-01 DIAGNOSIS — R531 Weakness: Secondary | ICD-10-CM | POA: Insufficient documentation

## 2024-04-01 DIAGNOSIS — Z923 Personal history of irradiation: Secondary | ICD-10-CM | POA: Insufficient documentation

## 2024-04-01 DIAGNOSIS — Z9221 Personal history of antineoplastic chemotherapy: Secondary | ICD-10-CM | POA: Insufficient documentation

## 2024-04-01 DIAGNOSIS — R5383 Other fatigue: Secondary | ICD-10-CM | POA: Insufficient documentation

## 2024-04-01 DIAGNOSIS — Z79899 Other long term (current) drug therapy: Secondary | ICD-10-CM | POA: Insufficient documentation

## 2024-04-01 LAB — RAD ONC ARIA SESSION SUMMARY
Course Elapsed Days: 55
Plan Fractions Treated to Date: 25
Plan Prescribed Dose Per Fraction: 1.8 Gy
Plan Total Fractions Prescribed: 25
Plan Total Prescribed Dose: 45 Gy
Reference Point Dosage Given to Date: 45 Gy
Reference Point Session Dosage Given: 1.8 Gy
Session Number: 25

## 2024-04-02 ENCOUNTER — Encounter

## 2024-04-02 ENCOUNTER — Other Ambulatory Visit: Payer: Self-pay

## 2024-04-03 ENCOUNTER — Encounter

## 2024-04-03 ENCOUNTER — Encounter (HOSPITAL_COMMUNITY): Payer: Self-pay | Admitting: *Deleted

## 2024-04-03 ENCOUNTER — Other Ambulatory Visit: Payer: Self-pay

## 2024-04-03 ENCOUNTER — Emergency Department (HOSPITAL_COMMUNITY)
Admission: EM | Admit: 2024-04-03 | Discharge: 2024-04-03 | Disposition: A | Discharge status: Home / Self Care | Attending: Emergency Medicine | Admitting: Emergency Medicine

## 2024-04-03 ENCOUNTER — Ambulatory Visit
Admission: RE | Admit: 2024-04-03 | Discharge: 2024-04-03 | Disposition: A | Discharge status: Home / Self Care | Source: Ambulatory Visit | Attending: Radiation Oncology | Admitting: Radiation Oncology

## 2024-04-03 ENCOUNTER — Emergency Department (HOSPITAL_COMMUNITY)

## 2024-04-03 ENCOUNTER — Ambulatory Visit

## 2024-04-03 DIAGNOSIS — W01198A Fall on same level from slipping, tripping and stumbling with subsequent striking against other object, initial encounter: Secondary | ICD-10-CM | POA: Insufficient documentation

## 2024-04-03 DIAGNOSIS — C20 Malignant neoplasm of rectum: Secondary | ICD-10-CM | POA: Diagnosis not present

## 2024-04-03 DIAGNOSIS — E876 Hypokalemia: Secondary | ICD-10-CM | POA: Diagnosis present

## 2024-04-03 DIAGNOSIS — C189 Malignant neoplasm of colon, unspecified: Secondary | ICD-10-CM | POA: Diagnosis not present

## 2024-04-03 DIAGNOSIS — W19XXXA Unspecified fall, initial encounter: Secondary | ICD-10-CM

## 2024-04-03 LAB — CBC WITH DIFFERENTIAL/PLATELET
Abs Immature Granulocytes: 0.03 K/uL (ref 0.00–0.07)
Basophils Absolute: 0 K/uL (ref 0.0–0.1)
Basophils Relative: 1 %
Eosinophils Absolute: 0.1 K/uL (ref 0.0–0.5)
Eosinophils Relative: 3 %
HCT: 35 % — ABNORMAL LOW (ref 36.0–46.0)
Hemoglobin: 11.3 g/dL — ABNORMAL LOW (ref 12.0–15.0)
Immature Granulocytes: 1 %
Lymphocytes Relative: 18 %
Lymphs Abs: 0.7 K/uL (ref 0.7–4.0)
MCH: 30.3 pg (ref 26.0–34.0)
MCHC: 32.3 g/dL (ref 30.0–36.0)
MCV: 93.8 fL (ref 80.0–100.0)
Monocytes Absolute: 0.5 K/uL (ref 0.1–1.0)
Monocytes Relative: 12 %
Neutro Abs: 2.4 K/uL (ref 1.7–7.7)
Neutrophils Relative %: 65 %
Platelets: 151 K/uL (ref 150–400)
RBC: 3.73 MIL/uL — ABNORMAL LOW (ref 3.87–5.11)
RDW: 17.2 % — ABNORMAL HIGH (ref 11.5–15.5)
WBC: 3.7 K/uL — ABNORMAL LOW (ref 4.0–10.5)
nRBC: 0 % (ref 0.0–0.2)

## 2024-04-03 LAB — RAD ONC ARIA SESSION SUMMARY
Course Elapsed Days: 57
Plan Fractions Treated to Date: 1
Plan Prescribed Dose Per Fraction: 1.8 Gy
Plan Total Fractions Prescribed: 3
Plan Total Prescribed Dose: 5.4 Gy
Reference Point Dosage Given to Date: 1.8 Gy
Reference Point Session Dosage Given: 1.8 Gy
Session Number: 26

## 2024-04-03 LAB — COMPREHENSIVE METABOLIC PANEL WITH GFR
ALT: 20 U/L (ref 0–44)
AST: 21 U/L (ref 15–41)
Albumin: 3.5 g/dL (ref 3.5–5.0)
Alkaline Phosphatase: 75 U/L (ref 38–126)
Anion gap: 12 (ref 5–15)
BUN: 9 mg/dL (ref 8–23)
CO2: 28 mmol/L (ref 22–32)
Calcium: 8.9 mg/dL (ref 8.9–10.3)
Chloride: 100 mmol/L (ref 98–111)
Creatinine, Ser: 0.61 mg/dL (ref 0.44–1.00)
GFR, Estimated: >60 mL/min (ref >60)
Glucose, Bld: 125 mg/dL — ABNORMAL HIGH (ref 70–99)
Potassium: 2.5 mmol/L — CL (ref 3.5–5.1)
Sodium: 140 mmol/L (ref 135–145)
Total Bilirubin: 0.9 mg/dL (ref 0.0–1.2)
Total Protein: 6.8 g/dL (ref 6.5–8.1)

## 2024-04-03 LAB — MAGNESIUM: Magnesium: 1.9 mg/dL (ref 1.7–2.4)

## 2024-04-03 MED ORDER — HEPARIN SOD (PORK) LOCK FLUSH 100 UNIT/ML IV SOLN
500.0000 [IU] | Freq: Once | INTRAVENOUS | Status: AC
  Administered 2024-04-03: 500 [IU]
  Filled 2024-04-03: qty 5

## 2024-04-03 MED ORDER — POTASSIUM CHLORIDE CRYS ER 20 MEQ PO TBCR
40.0000 meq | EXTENDED_RELEASE_TABLET | Freq: Once | ORAL | Status: AC
  Administered 2024-04-03: 40 meq via ORAL
  Filled 2024-04-03: qty 2

## 2024-04-03 MED ORDER — SODIUM CHLORIDE 0.9 % IV BOLUS
500.0000 mL | Freq: Once | INTRAVENOUS | Status: AC
  Administered 2024-04-03: 500 mL via INTRAVENOUS

## 2024-04-03 MED ORDER — POTASSIUM CHLORIDE 10 MEQ/100ML IV SOLN
10.0000 meq | INTRAVENOUS | Status: AC
  Administered 2024-04-03 (×3): 10 meq via INTRAVENOUS
  Filled 2024-04-03: qty 100

## 2024-04-03 MED ORDER — POTASSIUM CHLORIDE 10 MEQ/100ML IV SOLN
10.0000 meq | Freq: Once | INTRAVENOUS | Status: AC
  Administered 2024-04-03: 10 meq via INTRAVENOUS
  Filled 2024-04-03: qty 100

## 2024-04-03 MED ORDER — ONDANSETRON HCL 4 MG/2ML IJ SOLN
4.0000 mg | Freq: Once | INTRAMUSCULAR | Status: DC
  Filled 2024-04-03: qty 2

## 2024-04-03 MED ORDER — POTASSIUM CHLORIDE CRYS ER 20 MEQ PO TBCR: 20.0000 meq | EXTENDED_RELEASE_TABLET | Freq: Every day | ORAL | 1 refills | Status: DC

## 2024-04-03 NOTE — Discharge Instructions (Signed)
 Please take the potassium tablets that we have prescribed, and follow-up closely with your primary care doctor, oncologist to ensure your potassium returns to a normal level.

## 2024-04-03 NOTE — ED Notes (Signed)
 Back from b/r via w/c, straight to CT.

## 2024-04-03 NOTE — ED Triage Notes (Signed)
 Brought to ED by [facility] staff by w/c. Here s/p fall outside of cancer center s/p radiation tx today. Here with DIL. Reported witnessed fall, tripped over curb, no LOC, not sure if she hit her head. Did hit her R elbow. No blood thinners. Denies pain, dizziness, syncope, sob, nausea, HA or other sx. EDPA into see upon arrival. VSS. Denies sx or complaints, states, feel normal. Pt of Dr. Timmy, h/o colorectal CA.

## 2024-04-03 NOTE — ED Notes (Signed)
 Blood and urine sent to lab

## 2024-04-03 NOTE — ED Notes (Signed)
 Pending CT. Up to b/r via w/c with assist. New lab orders received. Family at [facility].

## 2024-04-03 NOTE — ED Provider Notes (Signed)
 [place] EMERGENCY DEPARTMENT AT [facility] Provider Note   CSN: [id number] Arrival date & time: 04/03/24  1615     Patient presents with: Fall   [name] is a 89 y.o. female with past medical history significant for Lynch syndrome, colon cancer who is a patient of Dr. Timmy.  She is presenting from the cancer center after receiving radiation today.  She reports tripping over the curb and hitting her head on the curb.  She denies feeling chest pain, shortness of breath, lightheadedness.  She does not take any blood thinners.    Fall       Prior to Admission medications   Medication Sig Start Date End Date Taking? Authorizing Provider  acetaminophen  (TYLENOL ) 325 MG tablet Take 2 tablets (650 mg total) by mouth every 6 (six) hours as needed for mild pain (or Fever >/= 101). Patient taking differently: Take 650 mg by mouth every 6 (six) hours as needed for mild pain (pain score 1-3) or fever. 01/28/23   Lue Elsie BROCKS, MD  ALPRAZolam  (XANAX ) 0.5 MG tablet Take 0.5 mg by mouth 2 (two) times daily. For anxiety    [provider]  amiodarone  (PACERONE ) 200 MG tablet Take 0.5 tablets (100 mg total) by mouth daily. 02/19/24   Nahser, Aleene PARAS, MD  amLODipine  (NORVASC ) 5 MG tablet Take 5 mg by mouth in the morning and at bedtime.    [provider]  cholecalciferol (VITAMIN D3) 25 MCG (1000 UNIT) tablet Take 1,000 Units by mouth daily.    [provider]  clotrimazole  (LOTRIMIN ) 1 % cream Apply 1 Application topically 2 (two) times daily. Patient not taking: Reported on 02/27/2024 08/20/23   Timmy Maude SAUNDERS, MD  cyanocobalamin  1000 MCG tablet Take 1 tablet (1,000 mcg total) by mouth daily. 01/28/23   Lue Elsie BROCKS, MD  diphenoxylate -atropine  (LOMOTIL ) 2.5-0.025 MG tablet Take 1 tablet by mouth 4 (four) times daily as needed for diarrhea or loose stools. Take 1-2 tabs PO bid-qid prn; Max: 8 tabs/day; Info: reduce dose when sx controlled;  D/C after 10 days if no improvement Patient taking differently: Take 2 tablets by mouth 4 (four) times daily. 02/21/24   Wyatt Leeroy HERO, PA-C  escitalopram  (LEXAPRO ) 5 MG tablet Take 5 mg by mouth at bedtime.    [provider]  famotidine  (PEPCID ) 20 MG tablet Take 20 mg by mouth at bedtime.    [provider]  fluticasone  (FLONASE ) 50 MCG/ACT nasal spray Place 2 sprays into both nostrils daily. Patient taking differently: Place 2 sprays into both nostrils daily as needed for allergies. 01/28/23   Lue Elsie BROCKS, MD  hydrALAZINE  (APRESOLINE ) 25 MG tablet Take 1 tablet (25 mg total) by mouth 2 (two) times daily. 12/14/23   Nahser, Aleene PARAS, MD  levothyroxine  (SYNTHROID ) 175 MCG tablet Take 175 mcg by mouth every morning. 04/27/23   [provider]  lidocaine -prilocaine  (EMLA ) cream Apply to affected area once Patient taking differently: Apply 1 Application topically daily as needed (port). 01/28/24   Timmy Maude SAUNDERS, MD  liver oil-zinc  oxide (DESITIN) 40 % ointment Apply topically as needed for irritation. 03/07/24   Darci Pore, MD  loperamide  (IMODIUM  A-D) 2 MG tablet Take 2 mg by mouth in the morning, at noon, in the evening, and at bedtime.    [provider]  loratadine  (CLARITIN ) 10 MG tablet Take 1 tablet (10 mg total) by mouth daily. 01/28/23   Lue Elsie BROCKS, MD  nystatin -triamcinolone  ointment (  MYCOLOG) Apply 1 Application topically 2 (two) times daily. Patient not taking: Reported on 02/27/2024 02/14/24   Wyatt Leeroy HERO, PA-C  ondansetron  (ZOFRAN ) 8 MG tablet Take 1 tablet (8 mg total) by mouth every 8 (eight) hours as needed for nausea or vomiting. 01/28/24   Timmy Maude SAUNDERS, MD  oxyCODONE -acetaminophen  (PERCOCET/ROXICET) 5-325 MG tablet Take 1 tablet by mouth every 6 (six) hours as needed for moderate pain (pain score 4-6) or severe pain (pain score 7-10). 03/07/24   Darci Pore, MD  potassium chloride  SA (KLOR-CON  M) 20 MEQ tablet  Take 1 tablet (20 mEq total) by mouth at bedtime. 04/03/24 05/03/24  Evadna Donaghy H, PA-C  prochlorperazine  (COMPAZINE ) 10 MG tablet Take 1 tablet (10 mg total) by mouth every 6 (six) hours as needed. 03/07/24   Darci Pore, MD  triamcinolone  (KENALOG ) 0.025 % ointment Apply 1 Application topically 2 (two) times daily. 02/25/24   Tonette Lauraine HERO, PA-C    Allergies: Aggrenox  [aspirin -dipyridamole  er] and Prednisone     Review of Systems  Updated Vital Signs BP (!) 173/69   Pulse 79   Temp 98.7 F (37.1 C) (Oral)   Resp 16   Wt 75.8 kg   SpO2 92%   BMI 32.61 kg/m   Physical Exam Vitals and nursing note reviewed.  Constitutional:      General: She is not in acute distress.    Appearance: Normal appearance.  HENT:     Head: Normocephalic and atraumatic.  Eyes:     General:        Right eye: No discharge.        Left eye: No discharge.  Cardiovascular:     Rate and Rhythm: Normal rate and regular rhythm.     Heart sounds: No murmur heard.    No friction rub. No gallop.  Pulmonary:     Effort: Pulmonary effort is normal.     Breath sounds: Normal breath sounds.  Abdominal:     General: Bowel sounds are normal.     Palpations: Abdomen is soft.  Skin:    General: Skin is warm and dry.     Capillary Refill: Capillary refill takes less than 2 seconds.     Comments: Atraumatic appearance of skull, no hematoma, no abrasion  Neurological:     Mental Status: She is alert and oriented to person, place, and time.     Comments: Moves all 4 limbs spontaneously, CN II through XII grossly intact, can ambulate without difficulty, intact sensation throughout.   Psychiatric:        Mood and Affect: Mood normal.        Behavior: Behavior normal.     (all labs ordered are listed, but only abnormal results are displayed) Labs Reviewed  COMPREHENSIVE METABOLIC PANEL WITH GFR - Abnormal; Notable for the following components:      Result Value   Potassium 2.5 (*)     Glucose, Bld 125 (*)    All other components within normal limits  CBC WITH DIFFERENTIAL/PLATELET - Abnormal; Notable for the following components:   WBC 3.7 (*)    RBC 3.73 (*)    Hemoglobin 11.3 (*)    HCT 35.0 (*)    RDW 17.2 (*)    All other components within normal limits  MAGNESIUM   CBC WITH DIFFERENTIAL/PLATELET    EKG: EKG Interpretation Date/Time:  Thursday April 03 2024 20:02:17 EDT Ventricular Rate:  78 PR Interval:  197 QRS Duration:  84 QT Interval:  437 QTC  Calculation: 498 R Axis:   69  Text Interpretation: Sinus rhythm Anterior infarct, old No significant change since last tracing Confirmed by Patt Alm DEL ([id number]) on 04/03/2024 8:09:25 PM  Radiology: CT Head Wo Contrast Result Date: 04/03/2024 CLINICAL DATA:  Head trauma, minor (Age >= 65y); Neck trauma (Age >= 65y) s/p fall outside of cancer center s/p radiation tx today. Reported witnessed fall, tripped over curb, no LOC, not sure if she hit her head. Did hit her R elbow. EXAM: CT HEAD WITHOUT CONTRAST CT CERVICAL SPINE WITHOUT CONTRAST TECHNIQUE: Multidetector CT imaging of the head and cervical spine was performed following the standard protocol without intravenous contrast. Multiplanar CT image reconstructions of the cervical spine were also generated. RADIATION DOSE REDUCTION: This exam was performed according to the departmental dose-optimization program which includes automated exposure control, adjustment of the mA and/or kV according to patient size and/or use of iterative reconstruction technique. COMPARISON:  None Available. FINDINGS: CT HEAD FINDINGS Brain: Cerebral ventricle sizes are concordant with the degree of cerebral volume loss. Patchy and confluent areas of decreased attenuation are noted throughout the deep and periventricular white matter of the cerebral hemispheres bilaterally, compatible with chronic microvascular ischemic disease. No evidence of large-territorial acute infarction. No parenchymal  hemorrhage. No mass lesion. No extra-axial collection. No mass effect or midline shift. No hydrocephalus. Basilar cisterns are patent. Vascular: No hyperdense vessel. Atherosclerotic calcifications are present within the cavernous internal carotid arteries. Skull: No acute fracture or focal lesion. Sinuses/Orbits: Paranasal sinuses and mastoid air cells are clear. Bilateral lens replacement. Otherwise the orbits are unremarkable. Other: None. CT CERVICAL SPINE FINDINGS Alignment: Normal. Skull base and vertebrae: Multilevel moderate degenerative changes of the spine. No associated severe osseous neural foraminal or central canal stenosis. No acute fracture. No aggressive appearing focal osseous lesion or focal pathologic process. Soft tissues and spinal canal: No prevertebral fluid or swelling. No visible canal hematoma. Upper chest: Unremarkable. Other: Obstruct plaque of the carotid arteries within the neck. Right chest wall Port-A-Cath partially visualized. IMPRESSION: 1. No acute intracranial abnormality. 2. No acute displaced fracture or traumatic listhesis of the cervical spine. Electronically Signed   By: Morgane  Naveau M.D.   On: 04/03/2024 17:57   CT Cervical Spine Wo Contrast Result Date: 04/03/2024 CLINICAL DATA:  Head trauma, minor (Age >= 65y); Neck trauma (Age >= 65y) s/p fall outside of cancer center s/p radiation tx today. Reported witnessed fall, tripped over curb, no LOC, not sure if she hit her head. Did hit her R elbow. EXAM: CT HEAD WITHOUT CONTRAST CT CERVICAL SPINE WITHOUT CONTRAST TECHNIQUE: Multidetector CT imaging of the head and cervical spine was performed following the standard protocol without intravenous contrast. Multiplanar CT image reconstructions of the cervical spine were also generated. RADIATION DOSE REDUCTION: This exam was performed according to the departmental dose-optimization program which includes automated exposure control, adjustment of the mA and/or kV according to  patient size and/or use of iterative reconstruction technique. COMPARISON:  None Available. FINDINGS: CT HEAD FINDINGS Brain: Cerebral ventricle sizes are concordant with the degree of cerebral volume loss. Patchy and confluent areas of decreased attenuation are noted throughout the deep and periventricular white matter of the cerebral hemispheres bilaterally, compatible with chronic microvascular ischemic disease. No evidence of large-territorial acute infarction. No parenchymal hemorrhage. No mass lesion. No extra-axial collection. No mass effect or midline shift. No hydrocephalus. Basilar cisterns are patent. Vascular: No hyperdense vessel. Atherosclerotic calcifications are present within the cavernous internal carotid arteries. Skull: No acute  fracture or focal lesion. Sinuses/Orbits: Paranasal sinuses and mastoid air cells are clear. Bilateral lens replacement. Otherwise the orbits are unremarkable. Other: None. CT CERVICAL SPINE FINDINGS Alignment: Normal. Skull base and vertebrae: Multilevel moderate degenerative changes of the spine. No associated severe osseous neural foraminal or central canal stenosis. No acute fracture. No aggressive appearing focal osseous lesion or focal pathologic process. Soft tissues and spinal canal: No prevertebral fluid or swelling. No visible canal hematoma. Upper chest: Unremarkable. Other: Obstruct plaque of the carotid arteries within the neck. Right chest wall Port-A-Cath partially visualized. IMPRESSION: 1. No acute intracranial abnormality. 2. No acute displaced fracture or traumatic listhesis of the cervical spine. Electronically Signed   By: Morgane  Naveau M.D.   On: 04/03/2024 17:57     Procedures   Medications Ordered in the ED  ondansetron  (ZOFRAN ) injection 4 mg (has no administration in time range)  potassium chloride  10 mEq in 100 mL IVPB (has no administration in time range)  potassium chloride  SA (KLOR-CON  M) CR tablet 40 mEq (40 mEq Oral Given 04/03/24  1856)  potassium chloride  10 mEq in 100 mL IVPB (10 mEq Intravenous New Bag/Given 04/03/24 2101)  sodium chloride  0.9 % bolus 500 mL (500 mLs Intravenous New Bag/Given 04/03/24 1852)                                    Medical Decision Making Amount and/or Complexity of Data Reviewed Labs: ordered. Radiology: ordered.  Risk Prescription drug management.   This patient is a 89 y.o. female  who presents to the ED for concern of fall, head injury.   Differential diagnoses prior to evaluation: The emergent differential diagnosis includes, but is not limited to,  epidural hematoma, subdural hematoma, skull fracture, subarachnoid hemorrhage, unstable cervical spine fracture, concussion vs other MSK injury  . This is not an exhaustive differential.   Past Medical History / Co-morbidities / Social History:  Lynch syndrome, colon cancer  Additional history: Chart reviewed. Pertinent results include: Reviewed previous lab work, imaging, outpatient oncology visits  Physical Exam: Physical exam performed. The pertinent findings include: Overall normal-appearing, no obvious sign of head trauma.  Moving all 4 limbs spontaneously.  She is somewhat hypertensive, blood pressure 173/82.  Lab Tests/Imaging studies: I personally interpreted labs/imaging and the pertinent results include: CBC overall unremarkable, mild anemia, globin 1.3.  Mild leukocytopenia, white blood cell 3.7.  Her CMP is notable for significant hypokalemia, testing 2.5, normal magnesium .  We will orally and IV replete her potassium. I independently interpreted CT head and CT c spine which shows no evidence of traumatic abnormalities. I agree with the radiologist interpretation.  Cardiac monitoring: EKG obtained and interpreted by myself and attending physician which shows: NSR mildly prolonged QT   Medications: I ordered medication including oral and IV potassium.  I have reviewed the patients home medicines and have made  adjustments as needed.   Disposition: After consideration of the diagnostic results and the patients response to treatment, I feel that patient stable for discharge, no traumatic fall injury, hypokalemia treated in the ED, discharged with potassium medication.SABRA   emergency department workup does not suggest an emergent condition requiring admission or immediate intervention beyond what has been performed at this time. The plan is: as above. The patient is safe for discharge and has been instructed to return immediately for worsening symptoms, change in symptoms or any other concerns.   Final diagnoses:  Hypokalemia  Fall, initial encounter    ED Discharge Orders          Ordered    potassium chloride  SA (KLOR-CON  M) 20 MEQ tablet  Daily at bedtime        04/03/24 2142               Hakan Nudelman H, PA-C 04/03/24 2211    Patt Alm Macho, MD 04/03/24 2232

## 2024-04-04 ENCOUNTER — Encounter

## 2024-04-04 ENCOUNTER — Encounter: Payer: Self-pay | Admitting: Hematology & Oncology

## 2024-04-04 ENCOUNTER — Ambulatory Visit
Admission: RE | Admit: 2024-04-04 | Discharge: 2024-04-04 | Disposition: A | Discharge status: Home / Self Care | Source: Ambulatory Visit | Attending: Radiation Oncology | Admitting: Radiation Oncology

## 2024-04-04 ENCOUNTER — Other Ambulatory Visit: Payer: Self-pay

## 2024-04-04 ENCOUNTER — Ambulatory Visit

## 2024-04-04 DIAGNOSIS — C20 Malignant neoplasm of rectum: Secondary | ICD-10-CM | POA: Diagnosis not present

## 2024-04-04 LAB — RAD ONC ARIA SESSION SUMMARY
Course Elapsed Days: 58
Plan Fractions Treated to Date: 2
Plan Prescribed Dose Per Fraction: 1.8 Gy
Plan Total Fractions Prescribed: 3
Plan Total Prescribed Dose: 5.4 Gy
Reference Point Dosage Given to Date: 3.6 Gy
Reference Point Session Dosage Given: 1.8 Gy
Session Number: 27

## 2024-04-07 ENCOUNTER — Ambulatory Visit
Admission: RE | Admit: 2024-04-07 | Discharge: 2024-04-07 | Disposition: A | Discharge status: Home / Self Care | Source: Ambulatory Visit | Attending: Radiation Oncology | Admitting: Radiation Oncology

## 2024-04-07 ENCOUNTER — Inpatient Hospital Stay (HOSPITAL_BASED_OUTPATIENT_CLINIC_OR_DEPARTMENT_OTHER): Admitting: Hematology & Oncology

## 2024-04-07 ENCOUNTER — Inpatient Hospital Stay

## 2024-04-07 ENCOUNTER — Encounter: Payer: Self-pay | Admitting: Hematology & Oncology

## 2024-04-07 ENCOUNTER — Other Ambulatory Visit: Payer: Self-pay

## 2024-04-07 VITALS — BP 173/58 | HR 77 | Temp 97.5°F | Resp 19 | Ht 60.0 in | Wt 168.0 lb

## 2024-04-07 DIAGNOSIS — D500 Iron deficiency anemia secondary to blood loss (chronic): Secondary | ICD-10-CM | POA: Diagnosis not present

## 2024-04-07 DIAGNOSIS — T451X5A Adverse effect of antineoplastic and immunosuppressive drugs, initial encounter: Secondary | ICD-10-CM

## 2024-04-07 DIAGNOSIS — Z95828 Presence of other vascular implants and grafts: Secondary | ICD-10-CM

## 2024-04-07 DIAGNOSIS — Z1509 Genetic susceptibility to other malignant neoplasm: Secondary | ICD-10-CM | POA: Diagnosis not present

## 2024-04-07 DIAGNOSIS — C20 Malignant neoplasm of rectum: Secondary | ICD-10-CM

## 2024-04-07 DIAGNOSIS — E860 Dehydration: Secondary | ICD-10-CM

## 2024-04-07 DIAGNOSIS — E876 Hypokalemia: Secondary | ICD-10-CM

## 2024-04-07 LAB — RAD ONC ARIA SESSION SUMMARY
Course Elapsed Days: 61
Plan Fractions Treated to Date: 3
Plan Prescribed Dose Per Fraction: 1.8 Gy
Plan Total Fractions Prescribed: 3
Plan Total Prescribed Dose: 5.4 Gy
Reference Point Dosage Given to Date: 5.4 Gy
Reference Point Session Dosage Given: 1.8 Gy
Session Number: 28

## 2024-04-07 LAB — CMP (CANCER CENTER ONLY)
ALT: 15 U/L (ref 0–44)
AST: 20 U/L (ref 15–41)
Albumin: 4 g/dL (ref 3.5–5.0)
Alkaline Phosphatase: 90 U/L (ref 38–126)
Anion gap: 11 (ref 5–15)
BUN: 6 mg/dL — ABNORMAL LOW (ref 8–23)
CO2: 30 mmol/L (ref 22–32)
Calcium: 8.9 mg/dL (ref 8.9–10.3)
Chloride: 98 mmol/L (ref 98–111)
Creatinine: 0.63 mg/dL (ref 0.44–1.00)
GFR, Estimated: >60 mL/min (ref >60)
Glucose, Bld: 139 mg/dL — ABNORMAL HIGH (ref 70–99)
Potassium: 2.7 mmol/L — CL (ref 3.5–5.1)
Sodium: 139 mmol/L (ref 135–145)
Total Bilirubin: 0.8 mg/dL (ref 0.0–1.2)
Total Protein: 6.6 g/dL (ref 6.5–8.1)

## 2024-04-07 LAB — CBC WITH DIFFERENTIAL (CANCER CENTER ONLY)
Abs Immature Granulocytes: 0.03 K/uL (ref 0.00–0.07)
Basophils Absolute: 0 K/uL (ref 0.0–0.1)
Basophils Relative: 1 %
Eosinophils Absolute: 0.1 K/uL (ref 0.0–0.5)
Eosinophils Relative: 2 %
HCT: 36.1 % (ref 36.0–46.0)
Hemoglobin: 11.8 g/dL — ABNORMAL LOW (ref 12.0–15.0)
Immature Granulocytes: 1 %
Lymphocytes Relative: 16 %
Lymphs Abs: 0.6 K/uL — ABNORMAL LOW (ref 0.7–4.0)
MCH: 30 pg (ref 26.0–34.0)
MCHC: 32.7 g/dL (ref 30.0–36.0)
MCV: 91.9 fL (ref 80.0–100.0)
Monocytes Absolute: 0.4 K/uL (ref 0.1–1.0)
Monocytes Relative: 11 %
Neutro Abs: 2.3 K/uL (ref 1.7–7.7)
Neutrophils Relative %: 69 %
Platelet Count: 199 K/uL (ref 150–400)
RBC: 3.93 MIL/uL (ref 3.87–5.11)
RDW: 17.1 % — ABNORMAL HIGH (ref 11.5–15.5)
WBC Count: 3.4 K/uL — ABNORMAL LOW (ref 4.0–10.5)
nRBC: 0 % (ref 0.0–0.2)

## 2024-04-07 LAB — MAGNESIUM: Magnesium: 1.7 mg/dL (ref 1.7–2.4)

## 2024-04-07 MED ORDER — SODIUM CHLORIDE 0.9 % IV SOLN: INTRAVENOUS | Status: DC

## 2024-04-07 MED ORDER — SODIUM CHLORIDE 0.9% FLUSH
10.0000 mL | Freq: Once | INTRAVENOUS | Status: AC
  Administered 2024-04-07: 10 mL via INTRAVENOUS

## 2024-04-07 MED ORDER — POTASSIUM CHLORIDE CRYS ER 20 MEQ PO TBCR: 40.0000 meq | EXTENDED_RELEASE_TABLET | Freq: Two times a day (BID) | ORAL | 1 refills | Status: AC

## 2024-04-07 MED ORDER — MAGNESIUM SULFATE 2 GM/50ML IV SOLN
2.0000 g | Freq: Once | INTRAVENOUS | Status: AC
  Administered 2024-04-07: 2 g via INTRAVENOUS
  Filled 2024-04-07: qty 50

## 2024-04-07 MED ORDER — HEPARIN SOD (PORK) LOCK FLUSH 100 UNIT/ML IV SOLN
500.0000 [IU] | Freq: Once | INTRAVENOUS | Status: AC
  Administered 2024-04-07: 500 [IU] via INTRAVENOUS

## 2024-04-07 MED ORDER — POTASSIUM CHLORIDE IN NACL 20-0.9 MEQ/L-% IV SOLN
Freq: Once | INTRAVENOUS | Status: AC
  Filled 2024-04-07: qty 1000

## 2024-04-07 MED ORDER — POTASSIUM CHLORIDE 10 MEQ/100ML IV SOLN
10.0000 meq | INTRAVENOUS | Status: DC
  Administered 2024-04-07: 10 meq via INTRAVENOUS
  Filled 2024-04-07: qty 100

## 2024-04-07 NOTE — Progress Notes (Signed)
 Hematology and Oncology Follow Up Visit  [name] [id number] 04/07/1929 89 y.o. 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 3 weeks-s/p cycle #1  -- start on 02/09/2023 -- d/c on 04/12/2023 Xeloda  1500 mg po BID (14/7) -- s/p cycle #6 on 02/13/2023 -- changed to 1000mg  po BID on 08/31/2023 --on hold since 08/2023  5-FU/Oxaliplatin  +XRT -- s/p cycle #3 -start on 01/28/2024     Interim History:  [name] is back for follow-up.  She had been hospitalized because of bad diarrhea.  She is in the hospital for about a week.  She improved.  We went and stopped the chemotherapy.  She just is getting radiotherapy.  She still has 1 more treatment left..  She is in the ER last week.  She apparently fell.  She has some weakness in the legs.  Her potassium was 2.5.  She got some oral potassium.  I think she still having some diarrhea.  On occasion, she says that she goes 4 times a day.  She is on some Imodium .  Today, potassium is 2.7.  Magnesium  is 1.7.  I think we are to have to replace both of these.  She has had no issues with fever.  She has had no bleeding.  There is been no rectal pain.  She has had no cough.  I think she is eating okay.  Her daughter comes in with her.  It sounds like she is eating okay without any nausea.  Again, she really has had a tough time with the protocol.  Initially, she did so well.  However, I did finally caught up with her and she began to have a lot of intestinal issues.  Thankfully, she has had no fever.  Currently, I would say that her performance status is probably ECOG 2.      Medications:  Current Outpatient Medications:    acetaminophen  (TYLENOL ) 325 MG tablet, Take 2 tablets (650 mg total) by mouth every 6 (six) hours as needed for mild pain (or Fever >/= 101)., Disp: 30 tablet, Rfl: 0   ALPRAZolam  (XANAX ) 0.5 MG tablet,  Take 0.5 mg by mouth 2 (two) times daily. For anxiety, Disp: , Rfl:    amiodarone  (PACERONE ) 200 MG tablet, Take 0.5 tablets (100 mg total) by mouth daily., Disp: 45 tablet, Rfl: 3   amLODipine  (NORVASC ) 5 MG tablet, Take 5 mg by mouth in the morning and at bedtime., Disp: , Rfl:    cholecalciferol (VITAMIN D3) 25 MCG (1000 UNIT) tablet, Take 1,000 Units by mouth daily., Disp: , Rfl:    clotrimazole  (LOTRIMIN ) 1 % cream, Apply 1 Application topically 2 (two) times daily., Disp: 30 g, Rfl: 0   diphenoxylate -atropine  (LOMOTIL ) 2.5-0.025 MG tablet, Take 1 tablet by mouth 4 (four) times daily as needed for diarrhea or loose stools. Take 1-2 tabs PO bid-qid prn; Max: 8 tabs/day; Info: reduce dose when sx controlled; D/C after 10 days if no improvement, Disp: 80 tablet, Rfl: 1   escitalopram  (LEXAPRO ) 5 MG tablet, Take 5 mg by mouth at bedtime., Disp: , Rfl:    famotidine  (PEPCID ) 20 MG tablet, Take 20 mg by mouth at bedtime., Disp: , Rfl:    fluticasone  (FLONASE ) 50 MCG/ACT nasal spray, Place 2 sprays into both nostrils daily., Disp: 11.1 mL, Rfl: 0   hydrALAZINE  (APRESOLINE ) 25 MG tablet, Take 1 tablet (25 mg total) by mouth 2 (  two) times daily., Disp: 180 tablet, Rfl: 3   levothyroxine  (SYNTHROID ) 175 MCG tablet, Take 175 mcg by mouth every morning., Disp: , Rfl:    lidocaine -prilocaine  (EMLA ) cream, Apply to affected area once, Disp: 30 g, Rfl: 3   liver oil-zinc  oxide (DESITIN) 40 % ointment, Apply topically as needed for irritation., Disp: 56.7 g, Rfl: 0   loperamide  (IMODIUM  A-D) 2 MG tablet, Take 2 mg by mouth in the morning, at noon, in the evening, and at bedtime., Disp: , Rfl:    loratadine  (CLARITIN ) 10 MG tablet, Take 1 tablet (10 mg total) by mouth daily., Disp: 30 tablet, Rfl: 0   nystatin -triamcinolone  ointment (MYCOLOG), Apply 1 Application topically 2 (two) times daily., Disp: 30 g, Rfl: 0   ondansetron  (ZOFRAN ) 8 MG tablet, Take 1 tablet (8 mg total) by mouth every 8 (eight) hours as  needed for nausea or vomiting., Disp: 30 tablet, Rfl: 1   oxyCODONE -acetaminophen  (PERCOCET/ROXICET) 5-325 MG tablet, Take 1 tablet by mouth every 6 (six) hours as needed for moderate pain (pain score 4-6) or severe pain (pain score 7-10)., Disp: 20 tablet, Rfl: 0   potassium chloride  SA (KLOR-CON  M) 20 MEQ tablet, Take 1 tablet (20 mEq total) by mouth at bedtime., Disp: 30 tablet, Rfl: 1   prochlorperazine  (COMPAZINE ) 10 MG tablet, Take 1 tablet (10 mg total) by mouth every 6 (six) hours as needed., Disp: 30 tablet, Rfl: 1   triamcinolone  (KENALOG ) 0.025 % ointment, Apply 1 Application topically 2 (two) times daily., Disp: 30 g, Rfl: 0   cyanocobalamin  1000 MCG tablet, Take 1 tablet (1,000 mcg total) by mouth daily., Disp: 30 tablet, Rfl: 0  Allergies:  Allergies  Allergen Reactions   Aggrenox  [Aspirin -Dipyridamole  Er] Other (See Comments)    Severe Bleeding and Stomach Pain   Prednisone  Other (See Comments)    Climbs the walls    Past Medical History, Surgical history, Social history, and Family History were reviewed and updated.  Review of Systems: Review of Systems  Constitutional:  Positive for fatigue.  HENT:  Negative.    Eyes: Negative.   Respiratory: Negative.    Cardiovascular:  Positive for palpitations.  Gastrointestinal:  Positive for blood in stool and rectal pain.  Endocrine: Negative.   Genitourinary: Negative.    Musculoskeletal: Negative.   Skin: Negative.   Neurological:  Positive for light-headedness.  Hematological: Negative.   Psychiatric/Behavioral: Negative.      Physical Exam: Her vital signs show temperature of 97.5.  Pulse 77.  Blood pressure 173/58.  Weight is 168 pounds.     Wt Readings from Last 3 Encounters:  04/07/24 168 lb (76.2 kg)  04/03/24 167 lb (75.8 kg)  02/26/24 167 lb 8.8 oz (76 kg)    Physical Exam Vitals reviewed.  HENT:     Head: Normocephalic and atraumatic.  Eyes:     Pupils: Pupils are equal, round, and reactive to  light.  Cardiovascular:     Rate and Rhythm: Normal rate and regular rhythm.     Heart sounds: Normal heart sounds.     Comments: Cardiac exam is regular rate and rhythm.  She has no murmurs, rubs or bruits. Pulmonary:     Effort: Pulmonary effort is normal.     Breath sounds: Normal breath sounds.     Comments: She has good air movement bilaterally.  She has some crackles bilaterally.  I hear no wheezes. Abdominal:     General: Bowel sounds are normal.     Palpations:  Abdomen is soft.  Musculoskeletal:        General: No tenderness or deformity. Normal range of motion.     Cervical back: Normal range of motion.  Lymphadenopathy:     Cervical: No cervical adenopathy.  Skin:    General: Skin is warm and dry.     Findings: No erythema or rash.  Neurological:     Mental Status: She is alert and oriented to person, place, and time.  Psychiatric:        Behavior: Behavior normal.        Thought Content: Thought content normal.        Judgment: Judgment normal.     Lab Results  Component Value Date   WBC 3.4 (L) 04/07/2024   HGB 11.8 (L) 04/07/2024   HCT 36.1 04/07/2024   MCV 91.9 04/07/2024   PLT 199 04/07/2024     Chemistry      Component Value Date/Time   NA 140 04/03/2024 1708   K 2.5 (LL) 04/03/2024 1708   CL 100 04/03/2024 1708   CO2 28 04/03/2024 1708   BUN 9 04/03/2024 1708   CREATININE 0.61 04/03/2024 1708   CREATININE 0.81 02/25/2024 1030      Component Value Date/Time   CALCIUM  8.9 04/03/2024 1708   ALKPHOS 75 04/03/2024 1708   AST 21 04/03/2024 1708   AST 16 02/25/2024 1030   ALT 20 04/03/2024 1708   ALT 30 02/25/2024 1030   BILITOT 0.9 04/03/2024 1708   BILITOT 0.7 02/25/2024 1030      Impression and Plan: Ms. Meisenheimer is a very charming 89 year old white female.  She has at least a locally advanced adenocarcinoma of the rectum.-I would have to say that she has had metastatic disease since the recent CT scan that she had done did not show any evidence  of the pulmonary nodule.  She now has recurrence.  I suspect that his recurrence is local..  I feel that the PET scan helps confirm that this is a local recurrence.  She will finally finished up her protocol tomorrow.  Again, she really has has had a tough time near the end of the protocol.  Hopefully, I thought we did do a good job getting chemotherapy into the system.  I probably would not do any follow-up scans probably for at least 6 weeks.  She may need to have another colonoscopy.  Again, we will see what the scans look like.  Again, today she will get IV potassium and magnesium .  I want her to come back in 3 days so we can follow-up.  I know that given her maturity, it would be difficult to do combination radiation chemotherapy with her.  However, I thought it was worthwhile as I felt that she would be able to manage toxicity.   Maude JONELLE Crease, MD 7/28/20251:30 PM

## 2024-04-07 NOTE — Progress Notes (Signed)
 Patient to receive potassium chloride  29mEq/100mL over 1 hour x 1. Then 1L NS w 20 mEq potassium chloride  over 2 hours for a total of 20 mEq potassium chloride  today. Magnesium  Sulfate 2 gms to infuse Y site with 1LNS w 20 mEq potassium chloride .   Bridgett Leach Brewer, [place], BCPS, BCOP 04/07/2024 2:24 PM

## 2024-04-07 NOTE — Progress Notes (Signed)
 Critical result received from lab of potassium 2.7  Dr. Timmy aware and orders received for potassium replacement.

## 2024-04-08 ENCOUNTER — Encounter: Payer: Self-pay | Admitting: Hematology & Oncology

## 2024-04-08 NOTE — Radiation Completion Notes (Signed)
  Radiation Oncology         [phone]) [phone] ________________________________  Name: [name] MRN: [id number]  Date of Service: 04/07/2024  DOB: 17-May-1929  End of Treatment Note   Diagnosis: Recurrent stage IIA (cT3, N0, M0) rectal adenocarcinoma  Intent: Curative     ==========DELIVERED PLANS==========  First Treatment Date: 2024-02-06 Last Treatment Date: 2024-04-07   Plan Name: Rectum Site: Rectum Technique: 3D Mode: Photon Dose Per Fraction: 1.8 Gy Prescribed Dose (Delivered / Prescribed): 45 Gy / 45 Gy Prescribed Fxs (Delivered / Prescribed): 25 / 25   Plan Name: Rectum_Bst Site: Rectum Technique: 3D Mode: Photon Dose Per Fraction: 1.8 Gy Prescribed Dose (Delivered / Prescribed): 5.4 Gy / 5.4 Gy Prescribed Fxs (Delivered / Prescribed): 3 / 3     ====================================   The patient tolerated radiation. She did develop severe diarrhea requiring hospitalization and an extended break in her radiation treatment. Ultimately, her chemotherapy was discontinued and she continued external beam radiation therapy alone, which she tolerated better. She again developed diarrhea, but it was very minimal. She received potassium and magnesium  supplement through medical oncology.   The patient will return in one month and will continue follow up with Dr. Timmy as well.      Ronita Due, PA-C

## 2024-04-10 ENCOUNTER — Inpatient Hospital Stay

## 2024-04-10 ENCOUNTER — Inpatient Hospital Stay (HOSPITAL_BASED_OUTPATIENT_CLINIC_OR_DEPARTMENT_OTHER): Admitting: Hematology & Oncology

## 2024-04-10 ENCOUNTER — Encounter: Payer: Self-pay | Admitting: *Deleted

## 2024-04-10 ENCOUNTER — Encounter: Payer: Self-pay | Admitting: Hematology & Oncology

## 2024-04-10 ENCOUNTER — Ambulatory Visit: Payer: Self-pay | Admitting: Hematology & Oncology

## 2024-04-10 ENCOUNTER — Other Ambulatory Visit: Payer: Self-pay

## 2024-04-10 VITALS — BP 153/61 | HR 80 | Temp 97.8°F | Resp 18 | Wt 167.0 lb

## 2024-04-10 DIAGNOSIS — C20 Malignant neoplasm of rectum: Secondary | ICD-10-CM

## 2024-04-10 DIAGNOSIS — D500 Iron deficiency anemia secondary to blood loss (chronic): Secondary | ICD-10-CM

## 2024-04-10 DIAGNOSIS — Z1509 Genetic susceptibility to other malignant neoplasm: Secondary | ICD-10-CM

## 2024-04-10 LAB — CMP (CANCER CENTER ONLY)
ALT: 13 U/L (ref 0–44)
AST: 20 U/L (ref 15–41)
Albumin: 4 g/dL (ref 3.5–5.0)
Alkaline Phosphatase: 86 U/L (ref 38–126)
Anion gap: 11 (ref 5–15)
BUN: <5 mg/dL — ABNORMAL LOW (ref 8–23)
CO2: 29 mmol/L (ref 22–32)
Calcium: 9 mg/dL (ref 8.9–10.3)
Chloride: 98 mmol/L (ref 98–111)
Creatinine: 0.62 mg/dL (ref 0.44–1.00)
GFR, Estimated: >60 mL/min (ref >60)
Glucose, Bld: 146 mg/dL — ABNORMAL HIGH (ref 70–99)
Potassium: 3.2 mmol/L — ABNORMAL LOW (ref 3.5–5.1)
Sodium: 138 mmol/L (ref 135–145)
Total Bilirubin: 0.7 mg/dL (ref 0.0–1.2)
Total Protein: 6.5 g/dL (ref 6.5–8.1)

## 2024-04-10 LAB — IRON AND IRON BINDING CAPACITY (CC-WL,HP ONLY)
Iron: 58 ug/dL (ref 28–170)
Saturation Ratios: 15 % (ref 10.4–31.8)
TIBC: 377 ug/dL (ref 250–450)
UIBC: 319 ug/dL

## 2024-04-10 LAB — CBC WITH DIFFERENTIAL (CANCER CENTER ONLY)
Abs Immature Granulocytes: 0.04 K/uL (ref 0.00–0.07)
Basophils Absolute: 0 K/uL (ref 0.0–0.1)
Basophils Relative: 1 %
Eosinophils Absolute: 0.1 K/uL (ref 0.0–0.5)
Eosinophils Relative: 3 %
HCT: 36.1 % (ref 36.0–46.0)
Hemoglobin: 11.7 g/dL — ABNORMAL LOW (ref 12.0–15.0)
Immature Granulocytes: 1 %
Lymphocytes Relative: 16 %
Lymphs Abs: 0.5 K/uL — ABNORMAL LOW (ref 0.7–4.0)
MCH: 29.8 pg (ref 26.0–34.0)
MCHC: 32.4 g/dL (ref 30.0–36.0)
MCV: 91.9 fL (ref 80.0–100.0)
Monocytes Absolute: 0.4 K/uL (ref 0.1–1.0)
Monocytes Relative: 11 %
Neutro Abs: 2.2 K/uL (ref 1.7–7.7)
Neutrophils Relative %: 68 %
Platelet Count: 201 K/uL (ref 150–400)
RBC: 3.93 MIL/uL (ref 3.87–5.11)
RDW: 17.1 % — ABNORMAL HIGH (ref 11.5–15.5)
WBC Count: 3.3 K/uL — ABNORMAL LOW (ref 4.0–10.5)
nRBC: 0 % (ref 0.0–0.2)

## 2024-04-10 LAB — FERRITIN: Ferritin: 138 ng/mL (ref 11–307)

## 2024-04-10 LAB — MAGNESIUM: Magnesium: 2 mg/dL (ref 1.7–2.4)

## 2024-04-10 MED ORDER — SODIUM CHLORIDE 0.9% FLUSH
10.0000 mL | Freq: Once | INTRAVENOUS | Status: AC
Start: 2024-04-10 — End: 2024-04-10
  Administered 2024-04-10: 10 mL

## 2024-04-10 MED ORDER — OLANZAPINE 5 MG PO TABS: 5.0000 mg | ORAL_TABLET | Freq: Every day | ORAL | 3 refills | Status: DC

## 2024-04-10 MED ORDER — HEPARIN SOD (PORK) LOCK FLUSH 100 UNIT/ML IV SOLN
500.0000 [IU] | Freq: Once | INTRAVENOUS | Status: AC
  Administered 2024-04-10: 500 [IU]

## 2024-04-10 NOTE — Progress Notes (Signed)
 Hematology and Oncology Follow Up Visit  [name] [id number] 12-May-1929 89 y.o. 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 3 weeks-s/p cycle #1  -- start on 02/09/2023 -- d/c on 04/12/2023 Xeloda  1500 mg po BID (14/7) -- s/p cycle #6 on 02/13/2023 -- changed to 1000mg  po BID on 08/31/2023 --on hold since 08/2023  5-FU/Oxaliplatin  +XRT -- s/p cycle #3 -start on 01/28/2024     Interim History:  [name] is back for follow-up.  She is doing better.  We actually saw her earlier this week.  She is having problems with diarrhea.  Her electrolytes were all off.  Thankfully, we were able to give her back some potassium and some magnesium .  She is on oral potassium at home.  Currently, she is taking 40 mEq twice daily of potassium at home.  I told her to do this for the next 5 days and then go down to 20 mEq p.o. twice daily.  She has had no problems with pain.  There is been no bleeding.  She has had nonausea which has not been helped by Compazine .  I will send in some olanzapine  (5 mg p.o. nightly) and we will see this helps her.  She has had no issues with her legs although there is been a little bit of swelling in the legs.  I think this might be from her having the mild anemia and also from her amlodipine .  Her last CEA was 1.73.  Overall, I would say that her performance status is probably ECOG 2.  Medications:  Current Outpatient Medications:    acetaminophen  (TYLENOL ) 325 MG tablet, Take 2 tablets (650 mg total) by mouth every 6 (six) hours as needed for mild pain (or Fever >/= 101)., Disp: 30 tablet, Rfl: 0   ALPRAZolam  (XANAX ) 0.5 MG tablet, Take 0.5 mg by mouth 2 (two) times daily. For anxiety, Disp: , Rfl:    amiodarone  (PACERONE ) 200 MG tablet, Take 0.5 tablets (100 mg total) by mouth daily., Disp: 45 tablet, Rfl: 3   amLODipine  (NORVASC ) 5 MG tablet,  Take 5 mg by mouth in the morning and at bedtime., Disp: , Rfl:    cholecalciferol (VITAMIN D3) 25 MCG (1000 UNIT) tablet, Take 1,000 Units by mouth daily., Disp: , Rfl:    clotrimazole  (LOTRIMIN ) 1 % cream, Apply 1 Application topically 2 (two) times daily., Disp: 30 g, Rfl: 0   cyanocobalamin  1000 MCG tablet, Take 1 tablet (1,000 mcg total) by mouth daily., Disp: 30 tablet, Rfl: 0   diphenoxylate -atropine  (LOMOTIL ) 2.5-0.025 MG tablet, Take 1 tablet by mouth 4 (four) times daily as needed for diarrhea or loose stools. Take 1-2 tabs PO bid-qid prn; Max: 8 tabs/day; Info: reduce dose when sx controlled; D/C after 10 days if no improvement, Disp: 80 tablet, Rfl: 1   escitalopram  (LEXAPRO ) 5 MG tablet, Take 5 mg by mouth at bedtime., Disp: , Rfl:    famotidine  (PEPCID ) 20 MG tablet, Take 20 mg by mouth at bedtime., Disp: , Rfl:    fluticasone  (FLONASE ) 50 MCG/ACT nasal spray, Place 2 sprays into both nostrils daily., Disp: 11.1 mL, Rfl: 0   hydrALAZINE  (APRESOLINE ) 25 MG tablet, Take 1 tablet (25 mg total) by mouth 2 (two) times daily., Disp: 180 tablet, Rfl: 3   levothyroxine  (SYNTHROID ) 175 MCG tablet, Take 175 mcg by mouth every morning., Disp: , Rfl:    lidocaine -prilocaine  (EMLA )  cream, Apply to affected area once, Disp: 30 g, Rfl: 3   liver oil-zinc  oxide (DESITIN) 40 % ointment, Apply topically as needed for irritation., Disp: 56.7 g, Rfl: 0   loperamide  (IMODIUM  A-D) 2 MG tablet, Take 2 mg by mouth in the morning, at noon, in the evening, and at bedtime., Disp: , Rfl:    loratadine  (CLARITIN ) 10 MG tablet, Take 1 tablet (10 mg total) by mouth daily., Disp: 30 tablet, Rfl: 0   nystatin -triamcinolone  ointment (MYCOLOG), Apply 1 Application topically 2 (two) times daily., Disp: 30 g, Rfl: 0   ondansetron  (ZOFRAN ) 8 MG tablet, Take 1 tablet (8 mg total) by mouth every 8 (eight) hours as needed for nausea or vomiting., Disp: 30 tablet, Rfl: 1   oxyCODONE -acetaminophen  (PERCOCET/ROXICET) 5-325 MG  tablet, Take 1 tablet by mouth every 6 (six) hours as needed for moderate pain (pain score 4-6) or severe pain (pain score 7-10)., Disp: 20 tablet, Rfl: 0   potassium chloride  SA (KLOR-CON  M) 20 MEQ tablet, Take 2 tablets (40 mEq total) by mouth 2 (two) times daily., Disp: 120 tablet, Rfl: 1   prochlorperazine  (COMPAZINE ) 10 MG tablet, Take 1 tablet (10 mg total) by mouth every 6 (six) hours as needed., Disp: 30 tablet, Rfl: 1   triamcinolone  (KENALOG ) 0.025 % ointment, Apply 1 Application topically 2 (two) times daily., Disp: 30 g, Rfl: 0  Allergies:  Allergies  Allergen Reactions   Aggrenox  [Aspirin -Dipyridamole  Er] Other (See Comments)    Severe Bleeding and Stomach Pain   Prednisone  Other (See Comments)    Climbs the walls    Past Medical History, Surgical history, Social history, and Family History were reviewed and updated.  Review of Systems: Review of Systems  Constitutional:  Positive for fatigue.  HENT:  Negative.    Eyes: Negative.   Respiratory: Negative.    Cardiovascular:  Positive for palpitations.  Gastrointestinal:  Positive for blood in stool and rectal pain.  Endocrine: Negative.   Genitourinary: Negative.    Musculoskeletal: Negative.   Skin: Negative.   Neurological:  Positive for light-headedness.  Hematological: Negative.   Psychiatric/Behavioral: Negative.      Physical Exam: Her vital signs show temperature of 97.8.  Pulse 80.  Blood pressure 153/61.  Weight is 167 pounds    Wt Readings from Last 3 Encounters:  04/10/24 167 lb (75.8 kg)  04/07/24 168 lb (76.2 kg)  04/03/24 167 lb (75.8 kg)    Physical Exam Vitals reviewed.  HENT:     Head: Normocephalic and atraumatic.  Eyes:     Pupils: Pupils are equal, round, and reactive to light.  Cardiovascular:     Rate and Rhythm: Normal rate and regular rhythm.     Heart sounds: Normal heart sounds.     Comments: Cardiac exam is regular rate and rhythm.  She has no murmurs, rubs or  bruits. Pulmonary:     Effort: Pulmonary effort is normal.     Breath sounds: Normal breath sounds.     Comments: She has good air movement bilaterally.  She has some crackles bilaterally.  I hear no wheezes. Abdominal:     General: Bowel sounds are normal.     Palpations: Abdomen is soft.  Musculoskeletal:        General: No tenderness or deformity. Normal range of motion.     Cervical back: Normal range of motion.  Lymphadenopathy:     Cervical: No cervical adenopathy.  Skin:    General: Skin is warm and  dry.     Findings: No erythema or rash.  Neurological:     Mental Status: She is alert and oriented to person, place, and time.  Psychiatric:        Behavior: Behavior normal.        Thought Content: Thought content normal.        Judgment: Judgment normal.     Lab Results  Component Value Date   WBC 3.3 (L) 04/10/2024   HGB 11.7 (L) 04/10/2024   HCT 36.1 04/10/2024   MCV 91.9 04/10/2024   PLT 201 04/10/2024     Chemistry      Component Value Date/Time   NA 139 04/07/2024 1236   K 2.7 (LL) 04/07/2024 1236   CL 98 04/07/2024 1236   CO2 30 04/07/2024 1236   BUN 6 (L) 04/07/2024 1236   CREATININE 0.63 04/07/2024 1236      Component Value Date/Time   CALCIUM  8.9 04/07/2024 1236   ALKPHOS 90 04/07/2024 1236   AST 20 04/07/2024 1236   ALT 15 04/07/2024 1236   BILITOT 0.8 04/07/2024 1236      Impression and Plan: Ms. Tocco is a very charming 89 year old white female.  She has at least a locally advanced adenocarcinoma of the rectum.-I would have to say that she has had metastatic disease since the recent CT scan that she had done did not show any evidence of the pulmonary nodule.  She now has recurrence.  I suspect that his recurrence is local..  I feel that the PET scan helps confirm that this is a local recurrence.  She completed her protocol with radiation therapy and chemotherapy.  We had to hold the chemotherapy for the past 2 weeks is because of toxicity.   However, I really think she did well and got quite a bit of chemotherapy in.  I would not recheck her for another 2 or 3 months.  We need to let the radiation get out of her system.  I would like to have her come back in 2 weeks so we can recheck her labs.  I will see her back in about 6 or 7 weeks.  When I see her back at that is 1 I will get everything scheduled to see how she responded.    Maude JONELLE Crease, MD 7/31/20258:57 AM

## 2024-04-10 NOTE — Addendum Note (Signed)
 Addended by: DORIEN FRANCHOT BROCKS on: 04/10/2024 09:23 AM   Modules accepted: Orders

## 2024-04-10 NOTE — Patient Instructions (Signed)

## 2024-04-12 ENCOUNTER — Other Ambulatory Visit: Payer: Self-pay

## 2024-04-24 ENCOUNTER — Ambulatory Visit: Payer: Self-pay | Admitting: Hematology & Oncology

## 2024-04-24 ENCOUNTER — Encounter: Payer: Self-pay | Admitting: Hematology & Oncology

## 2024-04-24 ENCOUNTER — Encounter: Payer: Self-pay | Admitting: *Deleted

## 2024-04-24 ENCOUNTER — Inpatient Hospital Stay

## 2024-04-24 ENCOUNTER — Inpatient Hospital Stay: Attending: Hematology & Oncology

## 2024-04-24 VITALS — BP 161/66 | HR 82 | Resp 18

## 2024-04-24 DIAGNOSIS — C20 Malignant neoplasm of rectum: Secondary | ICD-10-CM | POA: Insufficient documentation

## 2024-04-24 DIAGNOSIS — D500 Iron deficiency anemia secondary to blood loss (chronic): Secondary | ICD-10-CM | POA: Insufficient documentation

## 2024-04-24 LAB — CMP (CANCER CENTER ONLY)
ALT: 18 U/L (ref 0–44)
AST: 22 U/L (ref 15–41)
Albumin: 4.2 g/dL (ref 3.5–5.0)
Alkaline Phosphatase: 103 U/L (ref 38–126)
Anion gap: 10 (ref 5–15)
BUN: 9 mg/dL (ref 8–23)
CO2: 27 mmol/L (ref 22–32)
Calcium: 9.4 mg/dL (ref 8.9–10.3)
Chloride: 99 mmol/L (ref 98–111)
Creatinine: 0.68 mg/dL (ref 0.44–1.00)
GFR, Estimated: >60 mL/min (ref >60)
Glucose, Bld: 127 mg/dL — ABNORMAL HIGH (ref 70–99)
Potassium: 3.8 mmol/L (ref 3.5–5.1)
Sodium: 136 mmol/L (ref 135–145)
Total Bilirubin: 0.5 mg/dL (ref 0.0–1.2)
Total Protein: 6.8 g/dL (ref 6.5–8.1)

## 2024-04-24 LAB — CBC WITH DIFFERENTIAL (CANCER CENTER ONLY)
Abs Immature Granulocytes: 0.02 K/uL (ref 0.00–0.07)
Basophils Absolute: 0 K/uL (ref 0.0–0.1)
Basophils Relative: 1 %
Eosinophils Absolute: 0.1 K/uL (ref 0.0–0.5)
Eosinophils Relative: 3 %
HCT: 37.9 % (ref 36.0–46.0)
Hemoglobin: 12.4 g/dL (ref 12.0–15.0)
Immature Granulocytes: 1 %
Lymphocytes Relative: 27 %
Lymphs Abs: 1.2 K/uL (ref 0.7–4.0)
MCH: 29.6 pg (ref 26.0–34.0)
MCHC: 32.7 g/dL (ref 30.0–36.0)
MCV: 90.5 fL (ref 80.0–100.0)
Monocytes Absolute: 0.5 K/uL (ref 0.1–1.0)
Monocytes Relative: 12 %
Neutro Abs: 2.4 K/uL (ref 1.7–7.7)
Neutrophils Relative %: 56 %
Platelet Count: 190 K/uL (ref 150–400)
RBC: 4.19 MIL/uL (ref 3.87–5.11)
RDW: 16.2 % — ABNORMAL HIGH (ref 11.5–15.5)
WBC Count: 4.2 K/uL (ref 4.0–10.5)
nRBC: 0 % (ref 0.0–0.2)

## 2024-04-24 MED ORDER — SODIUM CHLORIDE 0.9 % IV SOLN: Freq: Once | INTRAVENOUS | Status: AC

## 2024-04-24 MED ORDER — SODIUM CHLORIDE 0.9 % IV SOLN
510.0000 mg | Freq: Once | INTRAVENOUS | Status: AC
  Administered 2024-04-24: 510 mg via INTRAVENOUS
  Filled 2024-04-24: qty 510

## 2024-04-24 NOTE — Patient Instructions (Signed)

## 2024-05-01 ENCOUNTER — Ambulatory Visit: Admitting: Radiation Oncology

## 2024-05-01 ENCOUNTER — Encounter: Payer: Self-pay | Admitting: Radiation Oncology

## 2024-05-02 ENCOUNTER — Other Ambulatory Visit: Payer: Self-pay | Admitting: Hematology & Oncology

## 2024-05-05 ENCOUNTER — Ambulatory Visit
Admission: RE | Admit: 2024-05-05 | Discharge: 2024-05-05 | Disposition: A | Discharge status: Home / Self Care | Source: Ambulatory Visit | Attending: Radiation Oncology | Admitting: Radiation Oncology

## 2024-05-05 ENCOUNTER — Encounter: Payer: Self-pay | Admitting: Radiation Oncology

## 2024-05-05 VITALS — BP 167/70 | HR 84 | Temp 97.1°F | Resp 18

## 2024-05-05 DIAGNOSIS — Z923 Personal history of irradiation: Secondary | ICD-10-CM | POA: Insufficient documentation

## 2024-05-05 DIAGNOSIS — Z79890 Hormone replacement therapy (postmenopausal): Secondary | ICD-10-CM | POA: Diagnosis not present

## 2024-05-05 DIAGNOSIS — C20 Malignant neoplasm of rectum: Secondary | ICD-10-CM | POA: Insufficient documentation

## 2024-05-05 DIAGNOSIS — Z9221 Personal history of antineoplastic chemotherapy: Secondary | ICD-10-CM | POA: Diagnosis not present

## 2024-05-05 DIAGNOSIS — Z79899 Other long term (current) drug therapy: Secondary | ICD-10-CM | POA: Insufficient documentation

## 2024-05-05 HISTORY — DX: Personal history of irradiation: Z92.3

## 2024-05-05 NOTE — Progress Notes (Addendum)
 [name] is here today for follow up post radiation to the pelvic.  They completed their radiation on: 2024-04-07   Does the patient complain of any of the following:  Pain:Denies Abdominal bloating: Denies Diarrhea/Constipation: She states off and on for both. Nausea/Vomiting: + nausea  Vaginal Discharge: Denies Blood in Urine or Stool: Denies Urinary Issues (dysuria/incomplete emptying/ incontinence/ increased frequency/urgency): frequency Post radiation skin changes:   Additional comments if applicable:   BP (!) 167/70 (BP Location: Left Arm, Patient Position: Sitting)   Pulse 84   Temp (!) 97.1 F (36.2 C)   Resp 18   Ht (P) 5' (1.524 m)   Wt (P) 164 lb 8 oz (74.6 kg)   SpO2 94%   BMI (P) 32.13 kg/m

## 2024-05-05 NOTE — Progress Notes (Signed)
 Radiation Oncology         [phone]) [phone] ________________________________  Name: [name] MRN: [id number]  Date: 05/05/2024  DOB: 13-Sep-1928  Follow-Up Visit Note  CC: Onita Rush, MD  Onita Rush, MD    ICD-10-CM   1. Rectal cancer (HCC)  C20        Diagnosis: Recurrent stage IIA (cT3, N0, M0) rectal adenocarcinoma; s/p radiation completed on 04/07/2024  Interval Since Last Radiation: almost 1 month  Intent: Curative  Radiation Treatment Dates: First Treatment Date: 2024-02-06 -- Last Treatment Date: 2024-04-07 Site/Dose/Technique/Mode:  Plan Name: Rectum Site: Rectum Technique: 3D Mode: Photon Dose Per Fraction: 1.8 Gy Prescribed Dose (Delivered / Prescribed): 45 Gy / 45 Gy Prescribed Fxs (Delivered / Prescribed): 25 / 25   Plan Name: Rectum_Bst Site: Rectum Technique: 3D Mode: Photon Dose Per Fraction: 1.8 Gy Prescribed Dose (Delivered / Prescribed): 5.4 Gy / 5.4 Gy Prescribed Fxs (Delivered / Prescribed): 3 / 3    Narrative:  The patient returns today for routine follow-up. She unfortunately developed severe diarrhea during her treatment course. She was hospitalized in this setting from 02/26/24 through 03/08/24 resulting in an extended break in her radiation treatment. She did have a CT AP w/ contrast performed during her admission on 06/17 which demonstrated: stable circumferential rectal wall thickening consistent with her known history of rectal cancer, and gas fluid levels throughout the colon consistent with diarrhea. Imaging otherwise showed no evidence of bowel obstruction or ileus.   Her chemotherapy was also discontinued on 06/16 given her significant symptoms and she continued on external beam radiation therapy alone which she tolerated better. She developed recurrent issues with diarrhea which was more minimal than her first bout. Her symptoms were managed with potassium and magnesium  supplements through medical oncology.         She was also seen in  the ED on 04/03/24 after experiencing a fall from tripping over a curb outside of the cancer center following her radiation treatment that day. She was unsure if she hit her head and a CT of the head and cervical spine was subsequently performed in the ED which showed no evidence of acute intracranial abnormality or evidence of acute injury in the cervical spine.   Per her most recent visit with Dr. Timmy on 04/10/24, she will likely not resume chemotherapy. Dr. Timmy would to see her back in 2 weeks for repeat labs, and for reassessment in approximately 2 months.   Patient notes nausea that has improved since completing her radiation treatment. She states that she has been using compazine  PRN for this which has been helping. She also notes an improvement in her diarrhea and is no longer taking anything for this. She also endorses bloating and associated flatulence. She denies any abdominal pain, blood in her stool, or pain with bowel movements.                        Allergies:  is allergic to aggrenox  [aspirin -dipyridamole  er] and prednisone .  Meds: Current Outpatient Medications  Medication Sig Dispense Refill   acetaminophen  (TYLENOL ) 325 MG tablet Take 2 tablets (650 mg total) by mouth every 6 (six) hours as needed for mild pain (or Fever >/= 101). 30 tablet 0   ALPRAZolam  (XANAX ) 0.5 MG tablet Take 0.5 mg by mouth 2 (two) times daily. For anxiety     amiodarone  (PACERONE ) 200 MG tablet Take 0.5 tablets (100 mg total) by mouth daily. 45 tablet 3  amLODipine  (NORVASC ) 5 MG tablet Take 5 mg by mouth in the morning and at bedtime.     cholecalciferol (VITAMIN D3) 25 MCG (1000 UNIT) tablet Take 1,000 Units by mouth daily.     clotrimazole  (LOTRIMIN ) 1 % cream Apply 1 Application topically 2 (two) times daily. 30 g 0   cyanocobalamin  1000 MCG tablet Take 1 tablet (1,000 mcg total) by mouth daily. 30 tablet 0   diphenoxylate -atropine  (LOMOTIL ) 2.5-0.025 MG tablet Take 1 tablet by mouth 4 (four)  times daily as needed for diarrhea or loose stools. Take 1-2 tabs PO bid-qid prn; Max: 8 tabs/day; Info: reduce dose when sx controlled; D/C after 10 days if no improvement 80 tablet 1   escitalopram  (LEXAPRO ) 5 MG tablet Take 5 mg by mouth at bedtime.     famotidine  (PEPCID ) 20 MG tablet Take 20 mg by mouth at bedtime.     fluticasone  (FLONASE ) 50 MCG/ACT nasal spray Place 2 sprays into both nostrils daily. 11.1 mL 0   hydrALAZINE  (APRESOLINE ) 25 MG tablet Take 1 tablet (25 mg total) by mouth 2 (two) times daily. 180 tablet 3   levothyroxine  (SYNTHROID ) 175 MCG tablet Take 175 mcg by mouth every morning.     lidocaine -prilocaine  (EMLA ) cream Apply to affected area once 30 g 3   liver oil-zinc  oxide (DESITIN) 40 % ointment Apply topically as needed for irritation. 56.7 g 0   loperamide  (IMODIUM  A-D) 2 MG tablet Take 2 mg by mouth in the morning, at noon, in the evening, and at bedtime.     loratadine  (CLARITIN ) 10 MG tablet Take 1 tablet (10 mg total) by mouth daily. 30 tablet 0   nystatin -triamcinolone  ointment (MYCOLOG) Apply 1 Application topically 2 (two) times daily. 30 g 0   OLANZapine  (ZYPREXA ) 5 MG tablet TAKE 1 TABLET BY MOUTH EVERYDAY AT BEDTIME 90 tablet 2   ondansetron  (ZOFRAN ) 8 MG tablet Take 1 tablet (8 mg total) by mouth every 8 (eight) hours as needed for nausea or vomiting. 30 tablet 1   oxyCODONE -acetaminophen  (PERCOCET/ROXICET) 5-325 MG tablet Take 1 tablet by mouth every 6 (six) hours as needed for moderate pain (pain score 4-6) or severe pain (pain score 7-10). 20 tablet 0   potassium chloride  SA (KLOR-CON  M) 20 MEQ tablet Take 2 tablets (40 mEq total) by mouth 2 (two) times daily. 120 tablet 1   prochlorperazine  (COMPAZINE ) 10 MG tablet Take 1 tablet (10 mg total) by mouth every 6 (six) hours as needed. 30 tablet 1   triamcinolone  (KENALOG ) 0.025 % ointment Apply 1 Application topically 2 (two) times daily. 30 g 0   No current facility-administered medications for this  encounter.    Physical Findings: The patient is in no acute distress. Patient is alert and oriented.  height is 5' (1.524 m) (pended) and weight is 164 lb 8 oz (74.6 kg) (pended). Her temperature is 97.1 F (36.2 C) (abnormal). Her blood pressure is 167/70 (abnormal) and her pulse is 84. Her respiration is 18 and oxygen  saturation is 94%. .  No significant changes. Lungs are clear to auscultation bilaterally. Heart has regular rate and rhythm. No palpable cervical, supraclavicular, or axillary adenopathy. Abdomen soft, non-tender, normal bowel sounds.    Lab Findings: Lab Results  Component Value Date   WBC 4.2 04/24/2024   HGB 12.4 04/24/2024   HCT 37.9 04/24/2024   MCV 90.5 04/24/2024   PLT 190 04/24/2024    Radiographic Findings: No results found.  Impression:  Recurrent stage IIA (cT3,  N0, M0) rectal adenocarcinoma; s/p radiation completed on 04/07/2024  The patient is recovering from the effects of radiation.  She fortunately is experiencing improvement in her symptoms since completing her radiation treatment. Recommend trial of Gas-X to help with bloating and continue Compazine  PRN.   Plan:  Patient will continue with surveillance under the care of Dr. Timmy. She is scheduled to see him next for follow-up on 05/29/2024. Radiation follow-up PRN. We appreciate the opportunity to take part in this patient's care.    20 minutes of total time was spent for this patient encounter, including preparation, face-to-face counseling with the patient and coordination of care, physical exam, and documentation of the encounter. ____________________________________   Leeroy Due, PA-C    This document serves as a record of services personally performed by Leeroy Due, PA-C. It was created on her behalf by Dorthy Fuse, a trained medical scribe. The creation of this record is based on the scribe's personal observations and the provider's statements to them. This document has been checked  and approved by the attending provider.

## 2024-05-06 ENCOUNTER — Encounter: Payer: Self-pay | Admitting: Hematology & Oncology

## 2024-05-10 ENCOUNTER — Other Ambulatory Visit: Payer: Self-pay | Admitting: Hematology & Oncology

## 2024-05-12 ENCOUNTER — Encounter: Payer: Self-pay | Admitting: Hematology & Oncology

## 2024-05-13 ENCOUNTER — Other Ambulatory Visit: Payer: Self-pay

## 2024-05-13 MED ORDER — AMLODIPINE BESYLATE 5 MG PO TABS: 5.0000 mg | ORAL_TABLET | Freq: Two times a day (BID) | ORAL | 2 refills | Status: AC

## 2024-05-29 ENCOUNTER — Encounter: Payer: Self-pay | Admitting: Hematology & Oncology

## 2024-05-29 ENCOUNTER — Inpatient Hospital Stay (HOSPITAL_BASED_OUTPATIENT_CLINIC_OR_DEPARTMENT_OTHER): Admitting: Hematology & Oncology

## 2024-05-29 ENCOUNTER — Inpatient Hospital Stay: Attending: Hematology & Oncology

## 2024-05-29 ENCOUNTER — Other Ambulatory Visit: Payer: Self-pay

## 2024-05-29 ENCOUNTER — Inpatient Hospital Stay

## 2024-05-29 VITALS — BP 170/61 | HR 85 | Temp 97.8°F | Resp 18

## 2024-05-29 DIAGNOSIS — C20 Malignant neoplasm of rectum: Secondary | ICD-10-CM

## 2024-05-29 LAB — CMP (CANCER CENTER ONLY)
ALT: 19 U/L (ref 0–44)
AST: 21 U/L (ref 15–41)
Albumin: 4.4 g/dL (ref 3.5–5.0)
Alkaline Phosphatase: 96 U/L (ref 38–126)
Anion gap: 11 (ref 5–15)
BUN: 13 mg/dL (ref 8–23)
CO2: 26 mmol/L (ref 22–32)
Calcium: 9.5 mg/dL (ref 8.9–10.3)
Chloride: 101 mmol/L (ref 98–111)
Creatinine: 0.76 mg/dL (ref 0.44–1.00)
GFR, Estimated: >60 mL/min (ref >60)
Glucose, Bld: 133 mg/dL — ABNORMAL HIGH (ref 70–99)
Potassium: 4.1 mmol/L (ref 3.5–5.1)
Sodium: 138 mmol/L (ref 135–145)
Total Bilirubin: 0.5 mg/dL (ref 0.0–1.2)
Total Protein: 7 g/dL (ref 6.5–8.1)

## 2024-05-29 LAB — IRON AND IRON BINDING CAPACITY (CC-WL,HP ONLY)
Iron: 66 ug/dL (ref 28–170)
Saturation Ratios: 19 % (ref 10.4–31.8)
TIBC: 357 ug/dL (ref 250–450)
UIBC: 291 ug/dL

## 2024-05-29 LAB — CBC WITH DIFFERENTIAL (CANCER CENTER ONLY)
Abs Immature Granulocytes: 0.02 K/uL (ref 0.00–0.07)
Basophils Absolute: 0.1 K/uL (ref 0.0–0.1)
Basophils Relative: 1 %
Eosinophils Absolute: 0.1 K/uL (ref 0.0–0.5)
Eosinophils Relative: 2 %
HCT: 39.8 % (ref 36.0–46.0)
Hemoglobin: 13.3 g/dL (ref 12.0–15.0)
Immature Granulocytes: 0 %
Lymphocytes Relative: 30 %
Lymphs Abs: 1.6 K/uL (ref 0.7–4.0)
MCH: 29.3 pg (ref 26.0–34.0)
MCHC: 33.4 g/dL (ref 30.0–36.0)
MCV: 87.7 fL (ref 80.0–100.0)
Monocytes Absolute: 0.4 K/uL (ref 0.1–1.0)
Monocytes Relative: 8 %
Neutro Abs: 3.2 K/uL (ref 1.7–7.7)
Neutrophils Relative %: 59 %
Platelet Count: 169 K/uL (ref 150–400)
RBC: 4.54 MIL/uL (ref 3.87–5.11)
RDW: 16.1 % — ABNORMAL HIGH (ref 11.5–15.5)
WBC Count: 5.4 K/uL (ref 4.0–10.5)
nRBC: 0 % (ref 0.0–0.2)

## 2024-05-29 LAB — FERRITIN: Ferritin: 301 ng/mL (ref 11–307)

## 2024-05-29 LAB — MAGNESIUM: Magnesium: 2 mg/dL (ref 1.7–2.4)

## 2024-05-29 NOTE — Patient Instructions (Signed)

## 2024-05-30 ENCOUNTER — Encounter: Payer: Self-pay | Admitting: Hematology & Oncology

## 2024-05-30 ENCOUNTER — Ambulatory Visit: Payer: Self-pay | Admitting: Hematology & Oncology

## 2024-05-30 NOTE — Progress Notes (Signed)
 Hematology and Oncology Follow Up Visit  [name] [id number] [birth year] 89 y.o. 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 3 weeks-s/p cycle #1  -- start on 02/09/2023 -- d/c on 04/12/2023 Xeloda  1500 mg po BID (14/7) -- s/p cycle #6 on 02/13/2023 -- changed to 1000mg  po BID on 08/31/2023 --on hold since 08/2023  5-FU/Oxaliplatin  +XRT -- s/p cycle #3 -start on 01/28/2024 -completed on 04/07/2024     Interim History:  [name] is back for follow-up.  She has completed all of her radiation therapy.  She actually was admitted to the hospital because of diarrhea.  She stopped the chemotherapy with the 5-FU and oxaliplatin .  She was hospitalized for 11 days.  Again, she looks great.  She feels good.  There is no problems with diarrhea.  There is no bleeding.  She has had no nausea or vomiting.  She is eating okay.  She has had no pain.  There is been no cough or shortness of breath.  She has had no leg swelling.  She has had no rashes.  Her last CEA was 1.73.  Again, I am very happy for her.  We now will be able to set her up with an MRI and a PET scan.  I think these will be done in about 3 to 4 weeks.  I want to make sure that we wait a good 2-3 months after completion of her radiation therapy so that we we will have a good result from her scans.  At the present time, I would say that her performance status is probably ECOG 1.     Medications:  Current Outpatient Medications:    acetaminophen  (TYLENOL ) 325 MG tablet, Take 2 tablets (650 mg total) by mouth every 6 (six) hours as needed for mild pain (or Fever >/= 101)., Disp: 30 tablet, Rfl: 0   ALPRAZolam  (XANAX ) 0.5 MG tablet, Take 0.5 mg by mouth 2 (two) times daily. For anxiety, Disp: , Rfl:    amiodarone  (PACERONE ) 200 MG tablet, Take 0.5 tablets (100 mg total) by mouth daily., Disp: 45 tablet, Rfl: 3    amLODipine  (NORVASC ) 5 MG tablet, Take 1 tablet (5 mg total) by mouth in the morning and at bedtime., Disp: 180 tablet, Rfl: 2   cholecalciferol (VITAMIN D3) 25 MCG (1000 UNIT) tablet, Take 1,000 Units by mouth daily., Disp: , Rfl:    clotrimazole  (LOTRIMIN ) 1 % cream, Apply 1 Application topically 2 (two) times daily., Disp: 30 g, Rfl: 0   cyanocobalamin  1000 MCG tablet, Take 1 tablet (1,000 mcg total) by mouth daily., Disp: 30 tablet, Rfl: 0   diphenoxylate -atropine  (LOMOTIL ) 2.5-0.025 MG tablet, Take 1 tablet by mouth 4 (four) times daily as needed for diarrhea or loose stools. Take 1-2 tabs PO bid-qid prn; Max: 8 tabs/day; Info: reduce dose when sx controlled; D/C after 10 days if no improvement, Disp: 80 tablet, Rfl: 1   escitalopram  (LEXAPRO ) 5 MG tablet, Take 5 mg by mouth at bedtime., Disp: , Rfl:    famotidine  (PEPCID ) 20 MG tablet, Take 20 mg by mouth at bedtime., Disp: , Rfl:    fluticasone  (FLONASE ) 50 MCG/ACT nasal spray, Place 2 sprays into both nostrils daily., Disp: 11.1 mL, Rfl: 0   hydrALAZINE  (APRESOLINE ) 25 MG tablet, Take 1 tablet (25 mg total) by mouth 2 (two) times daily., Disp: 180 tablet, Rfl: 3   levothyroxine  (SYNTHROID ) 175  MCG tablet, Take 175 mcg by mouth every morning., Disp: , Rfl:    lidocaine -prilocaine  (EMLA ) cream, Apply to affected area once, Disp: 30 g, Rfl: 3   liver oil-zinc  oxide (DESITIN) 40 % ointment, Apply topically as needed for irritation., Disp: 56.7 g, Rfl: 0   loperamide  (IMODIUM  A-D) 2 MG tablet, Take 2 mg by mouth in the morning, at noon, in the evening, and at bedtime., Disp: , Rfl:    loratadine  (CLARITIN ) 10 MG tablet, Take 1 tablet (10 mg total) by mouth daily., Disp: 30 tablet, Rfl: 0   nystatin -triamcinolone  ointment (MYCOLOG), Apply 1 Application topically 2 (two) times daily., Disp: 30 g, Rfl: 0   ondansetron  (ZOFRAN ) 8 MG tablet, Take 1 tablet (8 mg total) by mouth every 8 (eight) hours as needed for nausea or vomiting., Disp: 30 tablet,  Rfl: 1   oxyCODONE -acetaminophen  (PERCOCET/ROXICET) 5-325 MG tablet, Take 1 tablet by mouth every 6 (six) hours as needed for moderate pain (pain score 4-6) or severe pain (pain score 7-10)., Disp: 20 tablet, Rfl: 0   potassium chloride  SA (KLOR-CON  M) 20 MEQ tablet, Take 2 tablets (40 mEq total) by mouth 2 (two) times daily., Disp: 120 tablet, Rfl: 1   prochlorperazine  (COMPAZINE ) 10 MG tablet, Take 1 tablet (10 mg total) by mouth every 6 (six) hours as needed., Disp: 30 tablet, Rfl: 1   triamcinolone  (KENALOG ) 0.025 % ointment, Apply 1 Application topically 2 (two) times daily., Disp: 30 g, Rfl: 0   triamterene -hydrochlorothiazide  (MAXZIDE-25) 37.5-25 MG tablet, TAKE 1 TABLET BY MOUTH EVERY DAY, Disp: 90 tablet, Rfl: 1  Allergies:  Allergies  Allergen Reactions   Aggrenox  [Aspirin -Dipyridamole  Er] Other (See Comments)    Severe Bleeding and Stomach Pain   Prednisone  Other (See Comments)    Climbs the walls    Past Medical History, Surgical history, Social history, and Family History were reviewed and updated.  Review of Systems: Review of Systems  Constitutional:  Positive for fatigue.  HENT:  Negative.    Eyes: Negative.   Respiratory: Negative.    Cardiovascular:  Positive for palpitations.  Gastrointestinal:  Positive for blood in stool and rectal pain.  Endocrine: Negative.   Genitourinary: Negative.    Musculoskeletal: Negative.   Skin: Negative.   Neurological:  Positive for light-headedness.  Hematological: Negative.   Psychiatric/Behavioral: Negative.      Physical Exam: Her vital signs show temperature of 97.8.  Pulse 85.  Blood pressure 170/61.  Weight was not taken.    Wt Readings from Last 3 Encounters:  05/05/24 (P) 164 lb 8 oz (74.6 kg)  04/10/24 167 lb (75.8 kg)  04/07/24 168 lb (76.2 kg)    Physical Exam Vitals reviewed.  HENT:     Head: Normocephalic and atraumatic.  Eyes:     Pupils: Pupils are equal, round, and reactive to light.   Cardiovascular:     Rate and Rhythm: Normal rate and regular rhythm.     Heart sounds: Normal heart sounds.     Comments: Cardiac exam is regular rate and rhythm.  She has no murmurs, rubs or bruits. Pulmonary:     Effort: Pulmonary effort is normal.     Breath sounds: Normal breath sounds.     Comments: She has good air movement bilaterally.  She has some crackles bilaterally.  I hear no wheezes. Abdominal:     General: Bowel sounds are normal.     Palpations: Abdomen is soft.  Musculoskeletal:  General: No tenderness or deformity. Normal range of motion.     Cervical back: Normal range of motion.  Lymphadenopathy:     Cervical: No cervical adenopathy.  Skin:    General: Skin is warm and dry.     Findings: No erythema or rash.  Neurological:     Mental Status: She is alert and oriented to person, place, and time.  Psychiatric:        Behavior: Behavior normal.        Thought Content: Thought content normal.        Judgment: Judgment normal.     Lab Results  Component Value Date   WBC 5.4 05/29/2024   HGB 13.3 05/29/2024   HCT 39.8 05/29/2024   MCV 87.7 05/29/2024   PLT 169 05/29/2024     Chemistry      Component Value Date/Time   NA 138 05/29/2024 1537   K 4.1 05/29/2024 1537   CL 101 05/29/2024 1537   CO2 26 05/29/2024 1537   BUN 13 05/29/2024 1537   CREATININE 0.76 05/29/2024 1537      Component Value Date/Time   CALCIUM  9.5 05/29/2024 1537   ALKPHOS 96 05/29/2024 1537   AST 21 05/29/2024 1537   ALT 19 05/29/2024 1537   BILITOT 0.5 05/29/2024 1537      Impression and Plan: Ms. Brent is a very charming 89 year old white female.  She has at least a locally advanced adenocarcinoma of the rectum.-I would have to say that she has had metastatic disease since the recent CT scan that she had done did not show any evidence of the pulmonary nodule.  She now has had a recurrence.  I suspect that his recurrence is local..  I feel that the PET scan helps  confirm that this is a local recurrence.  She completed her protocol with radiation therapy and chemotherapy.  We had to hold the chemotherapy for the past 2 weeks is because of toxicity.  However, I really think she did well and got quite a bit of chemotherapy in.  We will now set her up with a PET scan and an MRI.  I think both would be helpful so that we can have a good idea as to her response.  I am just happy that her quality of life is doing better.  I know she really had a miserable time with treatment.  However, I think she has responded well.  I will plan to get the scans in about 2 or 3 weeks.  I would like to see her back probably in about 6 weeks.   Maude JONELLE Crease, MD 9/19/20254:58 PM

## 2024-06-03 ENCOUNTER — Other Ambulatory Visit: Payer: Self-pay

## 2024-06-27 ENCOUNTER — Ambulatory Visit (HOSPITAL_COMMUNITY)
Admission: RE | Admit: 2024-06-27 | Discharge: 2024-06-27 | Disposition: A | Source: Ambulatory Visit | Attending: Hematology & Oncology | Admitting: Hematology & Oncology

## 2024-06-27 DIAGNOSIS — C20 Malignant neoplasm of rectum: Secondary | ICD-10-CM | POA: Insufficient documentation

## 2024-06-27 LAB — GLUCOSE, CAPILLARY: Glucose-Capillary: 143 mg/dL — ABNORMAL HIGH (ref 70–99)

## 2024-06-27 MED ORDER — FLUDEOXYGLUCOSE F - 18 (FDG) INJECTION
8.0000 | Freq: Once | INTRAVENOUS | Status: AC
  Administered 2024-06-27: 8.13 via INTRAVENOUS

## 2024-06-30 ENCOUNTER — Encounter: Payer: Self-pay | Admitting: Hematology & Oncology

## 2024-07-01 ENCOUNTER — Ambulatory Visit: Payer: Self-pay | Admitting: Hematology & Oncology

## 2024-07-01 ENCOUNTER — Encounter: Payer: Self-pay | Admitting: *Deleted

## 2024-07-10 ENCOUNTER — Inpatient Hospital Stay: Attending: Hematology & Oncology

## 2024-07-10 ENCOUNTER — Inpatient Hospital Stay

## 2024-07-10 ENCOUNTER — Inpatient Hospital Stay (HOSPITAL_BASED_OUTPATIENT_CLINIC_OR_DEPARTMENT_OTHER): Admitting: Hematology & Oncology

## 2024-07-10 VITALS — BP 172/62 | HR 84 | Temp 97.9°F | Resp 18 | Wt 163.0 lb

## 2024-07-10 DIAGNOSIS — Z87891 Personal history of nicotine dependence: Secondary | ICD-10-CM | POA: Diagnosis not present

## 2024-07-10 DIAGNOSIS — K149 Disease of tongue, unspecified: Secondary | ICD-10-CM | POA: Diagnosis not present

## 2024-07-10 DIAGNOSIS — C20 Malignant neoplasm of rectum: Secondary | ICD-10-CM | POA: Insufficient documentation

## 2024-07-10 LAB — CBC WITH DIFFERENTIAL (CANCER CENTER ONLY)
Abs Immature Granulocytes: 0.02 K/uL (ref 0.00–0.07)
Basophils Absolute: 0 K/uL (ref 0.0–0.1)
Basophils Relative: 1 %
Eosinophils Absolute: 0.1 K/uL (ref 0.0–0.5)
Eosinophils Relative: 1 %
HCT: 39.8 % (ref 36.0–46.0)
Hemoglobin: 12.9 g/dL (ref 12.0–15.0)
Immature Granulocytes: 1 %
Lymphocytes Relative: 31 %
Lymphs Abs: 1.3 K/uL (ref 0.7–4.0)
MCH: 27.7 pg (ref 26.0–34.0)
MCHC: 32.4 g/dL (ref 30.0–36.0)
MCV: 85.4 fL (ref 80.0–100.0)
Monocytes Absolute: 0.4 K/uL (ref 0.1–1.0)
Monocytes Relative: 9 %
Neutro Abs: 2.5 K/uL (ref 1.7–7.7)
Neutrophils Relative %: 57 %
Platelet Count: 162 K/uL (ref 150–400)
RBC: 4.66 MIL/uL (ref 3.87–5.11)
RDW: 17.8 % — ABNORMAL HIGH (ref 11.5–15.5)
WBC Count: 4.3 K/uL (ref 4.0–10.5)
nRBC: 0 % (ref 0.0–0.2)

## 2024-07-10 LAB — CMP (CANCER CENTER ONLY)
ALT: 24 U/L (ref 0–44)
AST: 22 U/L (ref 15–41)
Albumin: 4.2 g/dL (ref 3.5–5.0)
Alkaline Phosphatase: 98 U/L (ref 38–126)
Anion gap: 13 (ref 5–15)
BUN: 15 mg/dL (ref 8–23)
CO2: 24 mmol/L (ref 22–32)
Calcium: 9.3 mg/dL (ref 8.9–10.3)
Chloride: 100 mmol/L (ref 98–111)
Creatinine: 0.74 mg/dL (ref 0.44–1.00)
GFR, Estimated: >60 mL/min (ref >60)
Glucose, Bld: 143 mg/dL — ABNORMAL HIGH (ref 70–99)
Potassium: 4.2 mmol/L (ref 3.5–5.1)
Sodium: 137 mmol/L (ref 135–145)
Total Bilirubin: 0.6 mg/dL (ref 0.0–1.2)
Total Protein: 6.8 g/dL (ref 6.5–8.1)

## 2024-07-10 LAB — LACTATE DEHYDROGENASE: LDH: 176 U/L (ref 98–192)

## 2024-07-10 LAB — CEA (ACCESS): CEA (CHCC): <1 ng/mL (ref 0.00–5.00)

## 2024-07-10 NOTE — Patient Instructions (Signed)

## 2024-07-10 NOTE — Progress Notes (Signed)
 Hematology and Oncology Follow Up Visit  [name] [id number] 09-Aug-1929 89 y.o. 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 3 weeks-s/p cycle #1  -- start on 02/09/2023 -- d/c on 04/12/2023 Xeloda  1500 mg po BID (14/7) -- s/p cycle #6 on 02/13/2023 -- changed to 1000mg  po BID on 08/31/2023 --on hold since 08/2023  5-FU/Oxaliplatin  +XRT -- s/p cycle #3 -start on 01/28/2024 -completed on 04/07/2024     Interim History:  [name] is back for follow-up.  She does look pretty good.  She is still having some intermittent diarrhea.  We did do her follow-up scans.  She had a PET scan that was done on 06/27/2024.  The PET scan seem to show some mild residual anorectal hypermetabolism which was improved from before.  The SUV was 5.7.  She also had a rectal MRI.  This was on 06/27/2024.  This seemed to show some nodular irregular wall thickening along the low left rectal wall.  This was felt to be likely posttreatment changes.  However, regarding need to get a colonoscopy to see what is going on there.  Hopefully it is just inflammation.  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  She has had this for a couple of months.  She has had a past history of tobacco use.  I think that is going to have to be biopsied.  Of course, she does not have a dentist.  We will have to see about getting her to likely an oral surgeon for a biopsy..  She has had no problems with pain.  She has had no problems with swallowing.  She had no problems with talking.  She has had no bleeding.  She is having no leg swelling.  She is having no cough.  She is having no nausea or vomiting.  Overall, I would say that her performance status is probably ECOG 1.  Medications:  Current Outpatient Medications:    acetaminophen  (TYLENOL )  325 MG tablet, Take 2 tablets (650 mg total) by mouth every 6 (six) hours as needed for mild pain (or Fever >/= 101)., Disp: 30 tablet, Rfl: 0   ALPRAZolam  (XANAX ) 0.5 MG tablet, Take 0.5 mg by mouth 2 (two) times daily. For anxiety, Disp: , Rfl:    amiodarone  (PACERONE ) 200 MG tablet, Take 0.5 tablets (100 mg total) by mouth daily., Disp: 45 tablet, Rfl: 3   amLODipine  (NORVASC ) 5 MG tablet, Take 1 tablet (5 mg total) by mouth in the morning and at bedtime., Disp: 180 tablet, Rfl: 2   cholecalciferol (VITAMIN D3) 25 MCG (1000 UNIT) tablet, Take 1,000 Units by mouth daily., Disp: , Rfl:    clotrimazole  (LOTRIMIN ) 1 % cream, Apply 1 Application topically 2 (two) times daily., Disp: 30 g, Rfl: 0   cyanocobalamin  1000 MCG tablet, Take 1 tablet (1,000 mcg total) by mouth daily., Disp: 30 tablet, Rfl: 0   diphenoxylate -atropine  (LOMOTIL ) 2.5-0.025 MG tablet, Take 1 tablet by mouth 4 (four) times daily as needed for diarrhea or loose stools. Take 1-2 tabs PO bid-qid prn; Max: 8 tabs/day; Info: reduce dose when sx controlled; D/C after 10 days if no improvement, Disp: 80 tablet, Rfl: 1   escitalopram  (LEXAPRO ) 5 MG tablet, Take 5 mg by mouth at bedtime., Disp: , Rfl:    famotidine  (PEPCID ) 20 MG  tablet, Take 20 mg by mouth at bedtime., Disp: , Rfl:    fluticasone  (FLONASE ) 50 MCG/ACT nasal spray, Place 2 sprays into both nostrils daily., Disp: 11.1 mL, Rfl: 0   hydrALAZINE  (APRESOLINE ) 25 MG tablet, Take 1 tablet (25 mg total) by mouth 2 (two) times daily., Disp: 180 tablet, Rfl: 3   levothyroxine  (SYNTHROID ) 175 MCG tablet, Take 175 mcg by mouth every morning., Disp: , Rfl:    lidocaine -prilocaine  (EMLA ) cream, Apply to affected area once, Disp: 30 g, Rfl: 3   liver oil-zinc  oxide (DESITIN) 40 % ointment, Apply topically as needed for irritation., Disp: 56.7 g, Rfl: 0   loperamide  (IMODIUM  A-D) 2 MG tablet, Take 2 mg by mouth in the morning, at noon, in the evening, and at bedtime., Disp: , Rfl:     loratadine  (CLARITIN ) 10 MG tablet, Take 1 tablet (10 mg total) by mouth daily., Disp: 30 tablet, Rfl: 0   nystatin -triamcinolone  ointment (MYCOLOG), Apply 1 Application topically 2 (two) times daily., Disp: 30 g, Rfl: 0   ondansetron  (ZOFRAN ) 8 MG tablet, Take 1 tablet (8 mg total) by mouth every 8 (eight) hours as needed for nausea or vomiting., Disp: 30 tablet, Rfl: 1   oxyCODONE -acetaminophen  (PERCOCET/ROXICET) 5-325 MG tablet, Take 1 tablet by mouth every 6 (six) hours as needed for moderate pain (pain score 4-6) or severe pain (pain score 7-10)., Disp: 20 tablet, Rfl: 0   potassium chloride  SA (KLOR-CON  M) 20 MEQ tablet, Take 2 tablets (40 mEq total) by mouth 2 (two) times daily., Disp: 120 tablet, Rfl: 1   prochlorperazine  (COMPAZINE ) 10 MG tablet, Take 1 tablet (10 mg total) by mouth every 6 (six) hours as needed., Disp: 30 tablet, Rfl: 1   triamcinolone  (KENALOG ) 0.025 % ointment, Apply 1 Application topically 2 (two) times daily., Disp: 30 g, Rfl: 0   triamterene -hydrochlorothiazide  (MAXZIDE-25) 37.5-25 MG tablet, TAKE 1 TABLET BY MOUTH EVERY DAY, Disp: 90 tablet, Rfl: 1  Allergies:  Allergies  Allergen Reactions   Aggrenox  [Aspirin -Dipyridamole  Er] Other (See Comments)    Severe Bleeding and Stomach Pain   Prednisone  Other (See Comments)    Climbs the walls    Past Medical History, Surgical history, Social history, and Family History were reviewed and updated.  Review of Systems: Review of Systems  Constitutional:  Positive for fatigue.  HENT:  Negative.    Eyes: Negative.   Respiratory: Negative.    Cardiovascular:  Positive for palpitations.  Gastrointestinal:  Positive for blood in stool and rectal pain.  Endocrine: Negative.   Genitourinary: Negative.    Musculoskeletal: Negative.   Skin: Negative.   Neurological:  Positive for light-headedness.  Hematological: Negative.   Psychiatric/Behavioral: Negative.      Physical Exam: Her vital signs show temperature of  97.9.  Pulse 84.  Blood pressure 172/62.  Weight is 163 pounds.     Wt Readings from Last 3 Encounters:  07/10/24 163 lb (73.9 kg)  05/05/24 (P) 164 lb 8 oz (74.6 kg)  04/10/24 167 lb (75.8 kg)    Physical Exam Vitals reviewed.  HENT:     Head: Normocephalic and atraumatic.     Mouth/Throat:     Comments: On the left side of the tongue, there is a irregular area of white matter.  It probably measures probably 3 x 2 mm. Eyes:     Pupils: Pupils are equal, round, and reactive to light.  Cardiovascular:     Rate and Rhythm: Normal rate and regular rhythm.  Heart sounds: Normal heart sounds.     Comments: Cardiac exam is regular rate and rhythm.  She has no murmurs, rubs or bruits. Pulmonary:     Effort: Pulmonary effort is normal.     Breath sounds: Normal breath sounds.     Comments: She has good air movement bilaterally.  She has some crackles bilaterally.  I hear no wheezes. Abdominal:     General: Bowel sounds are normal.     Palpations: Abdomen is soft.  Musculoskeletal:        General: No tenderness or deformity. Normal range of motion.     Cervical back: Normal range of motion.  Lymphadenopathy:     Cervical: No cervical adenopathy.  Skin:    General: Skin is warm and dry.     Findings: No erythema or rash.  Neurological:     Mental Status: She is alert and oriented to person, place, and time.  Psychiatric:        Behavior: Behavior normal.        Thought Content: Thought content normal.        Judgment: Judgment normal.     Lab Results  Component Value Date   WBC 4.3 07/10/2024   HGB 12.9 07/10/2024   HCT 39.8 07/10/2024   MCV 85.4 07/10/2024   PLT 162 07/10/2024     Chemistry      Component Value Date/Time   NA 137 07/10/2024 1520   K 4.2 07/10/2024 1520   CL 100 07/10/2024 1520   CO2 24 07/10/2024 1520   BUN 15 07/10/2024 1520   CREATININE 0.74 07/10/2024 1520      Component Value Date/Time   CALCIUM  9.3 07/10/2024 1520   ALKPHOS 98  07/10/2024 1520   AST 22 07/10/2024 1520   ALT 24 07/10/2024 1520   BILITOT 0.6 07/10/2024 1520      Impression and Plan: [name] is a very charming 89 year old white female.  She has at least a locally advanced adenocarcinoma of the rectum.-I would have to say that she has had metastatic disease since the recent CT scan that she had done did not show any evidence of the pulmonary nodule.  She now has had a recurrence.  I suspect that his recurrence is local..  We treated her with radiation and chemotherapy.  I know she had a tough time with the combination of of treatment.  We actually held the chemotherapy for the last 2 weeks of radiation.  I have to believe that she has responded well.  Hopefully, when she has a colonoscopy or flexible sigmoidoscopy, that everything will be treatment related and not cancer.  As far as his tongue lesion, we will have to work on finding an transport planner for her.  Hopefully, this is not leukoplakia.  If it is, I need to make sure that this is taking care of.  Again, it is always been about quality of life for Ms. Rossa.  I want her to have a decent quality of life.  I would like to get her back to see us  in about 6 weeks.  By then, we would have had all the test that we needed to have done.  At that time, we can hopefully then get her through the rest of the Holiday season.    Maude JONELLE Crease, MD 10/30/20255:25 PM

## 2024-07-12 ENCOUNTER — Other Ambulatory Visit: Payer: Self-pay

## 2024-07-14 ENCOUNTER — Telehealth: Payer: Self-pay

## 2024-07-14 ENCOUNTER — Other Ambulatory Visit: Payer: Self-pay

## 2024-07-14 DIAGNOSIS — Z85038 Personal history of other malignant neoplasm of large intestine: Secondary | ICD-10-CM

## 2024-07-14 DIAGNOSIS — R197 Diarrhea, unspecified: Secondary | ICD-10-CM

## 2024-07-14 NOTE — Telephone Encounter (Signed)
 Pt son Butler made aware of Dr. Charlanne recommendations.  Pt was scheduled for the Flex Sig on 07/22/2024 at 10:00 AM  in the [facility] with Dr. Charlanne.  Butler was made aware.  Butler stated that the pt is not on any Blood Thinners. Prep instructions were sent to pt via My Chart. Butler made aware.  Ambulatory referral to GI placed in Epic. Butler  verbalized understanding with all questions answered.

## 2024-07-14 NOTE — Telephone Encounter (Signed)
-----   Message from Lynnie Bring sent at 07/13/2024  4:38 PM EST ----- Regarding: Flexible sigmoidoscopy Absolutely, [name] Will take care of it RG   Elspeth, Please set her up for flexible sigmoidoscopy at [facility] (off [facility]) ASAP RG ----- Message ----- From: Timmy Maude SAUNDERS, MD Sent: 07/10/2024   5:22 PM EST To: Lynnie Bring, MD  Anselm: I need your help with [name].  She completed all of her radiation and chemotherapy 3 months ago.  Need to see if she actually has any residual disease in the rectum.  The PET scan may suggest residual disease.  The MRI certainly seems as think this is all posttreatment.  I really need to have a biopsy if he can do this for me..  She is also having some intermittent diarrhea.  I do not know if this may be some colitis.  If you can call her daughter and get everything set up that would be fantastic.  Thanks for all of your help.  [name]

## 2024-07-15 ENCOUNTER — Other Ambulatory Visit: Payer: Self-pay

## 2024-07-18 ENCOUNTER — Telehealth: Payer: Self-pay | Admitting: Gastroenterology

## 2024-07-18 NOTE — Telephone Encounter (Signed)
 Inbound call from patient son requesting to find out if our Precert department has got in touch with his mother health insurance in order for her to have this procedure done. Patient is scheduled for November the 11 th. Patient sone is requesting a call back. Please advise.

## 2024-07-21 ENCOUNTER — Encounter: Payer: Self-pay | Admitting: Hematology & Oncology

## 2024-07-22 ENCOUNTER — Encounter: Payer: Self-pay | Admitting: Gastroenterology

## 2024-07-22 ENCOUNTER — Ambulatory Visit: Admitting: Gastroenterology

## 2024-07-22 VITALS — BP 160/67 | HR 70 | Temp 97.3°F | Resp 14 | Ht 60.0 in | Wt 164.0 lb

## 2024-07-22 DIAGNOSIS — Z85038 Personal history of other malignant neoplasm of large intestine: Secondary | ICD-10-CM

## 2024-07-22 DIAGNOSIS — K5730 Diverticulosis of large intestine without perforation or abscess without bleeding: Secondary | ICD-10-CM

## 2024-07-22 DIAGNOSIS — C20 Malignant neoplasm of rectum: Secondary | ICD-10-CM | POA: Diagnosis not present

## 2024-07-22 MED ORDER — SODIUM CHLORIDE 0.9 % IV SOLN: 500.0000 mL | Freq: Once | INTRAVENOUS | Status: AC

## 2024-07-22 NOTE — Patient Instructions (Signed)
 Educational handout provided to patient related to Hemorrhoids, Polyps, and Diverticulosis  Resume previous diet  Continue present medications  Awaiting pathology results  YOU HAD AN ENDOSCOPIC PROCEDURE TODAY AT THE [facility]:   Refer to the procedure report that was given to you for any specific questions about what was found during the examination.  If the procedure report does not answer your questions, please call your gastroenterologist to clarify.  If you requested that your care partner not be given the details of your procedure findings, then the procedure report has been included in a sealed envelope for you to review at your convenience later.  YOU SHOULD EXPECT: Some feelings of bloating in the abdomen. Passage of more gas than usual.  Walking can help get rid of the air that was put into your GI tract during the procedure and reduce the bloating. If you had a lower endoscopy (such as a colonoscopy or flexible sigmoidoscopy) you may notice spotting of blood in your stool or on the toilet paper. If you underwent a bowel prep for your procedure, you may not have a normal bowel movement for a few days.  Please Note:  You might notice some irritation and congestion in your nose or some drainage.  This is from the oxygen used during your procedure.  There is no need for concern and it should clear up in a day or so.  SYMPTOMS TO REPORT IMMEDIATELY:  Following lower endoscopy (colonoscopy or flexible sigmoidoscopy):  Excessive amounts of blood in the stool  Significant tenderness or worsening of abdominal pains  Swelling of the abdomen that is new, acute  Fever of 100F or higher  For urgent or emergent issues, a gastroenterologist can be reached at any hour by calling [phone]. Do not use MyChart messaging for urgent concerns.    DIET:  We do recommend a small meal at first, but then you may proceed to your regular diet.  Drink plenty of fluids but you should  avoid alcoholic beverages for 24 hours.  ACTIVITY:  You should plan to take it easy for the rest of today and you should NOT DRIVE or use heavy machinery until tomorrow (because of the sedation medicines used during the test).    FOLLOW UP: Our staff will call the number listed on your records the next business day following your procedure.  We will call around 7:15- 8:00 am to check on you and address any questions or concerns that you may have regarding the information given to you following your procedure. If we do not reach you, we will leave a message.     If any biopsies were taken you will be contacted by phone or by letter within the next 1-3 weeks.  Please call us at [phone] if you have not heard about the biopsies in 3 weeks.    SIGNATURES/CONFIDENTIALITY: You and/or your care partner have signed paperwork which will be entered into your electronic medical record.  These signatures attest to the fact that that the information above on your After Visit Summary has been reviewed and is understood.  Full responsibility of the confidentiality of this discharge information lies with you and/or your care-partner.

## 2024-07-22 NOTE — Op Note (Signed)
 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 Medicines:                Propofol  per Anesthesia, None Procedure:                Pre-Anesthesia Assessment:                           - Prior to the procedure, a History and Physical                            was performed, and patient medications and                            allergies were reviewed. The patient's tolerance of                            previous anesthesia was also reviewed. The risks                            and benefits of the procedure and the sedation                            options and risks were discussed with the patient.                            All questions were answered, and informed consent                            was obtained. Prior Anticoagulants: The patient has                            taken no anticoagulant or antiplatelet agents. ASA                            Grade Assessment: III - A patient with severe                            systemic disease. After reviewing the risks and                            benefits, the patient was deemed in satisfactory                            condition to undergo the procedure.  After obtaining informed consent, the scope was                            passed under direct vision. The PCF-[id number] Slim SN                            [id number] was introduced through the anus and                            advanced to the the sigmoid colon upto 35 cm                            (stopped d/t stool). The flexible sigmoidoscopy was                            accomplished without difficulty. The  patient                            tolerated the procedure well. The quality of the                            bowel preparation was good. Scope In: 10:56:24 AM Scope Out: 11:02:26 AM Total Procedure Duration: 0 hours 6 minutes 2 seconds  Findings:                 A small 1.5 x 2 cm ulcerated non-obstructing, not                            circumferential small mass was found in the distal                            rectum. Significantly better than the previous                            flexible sigmoidoscopy. No bleeding was present.                            This was biopsied with a cold forceps for histology.                           A few small patchy angioectasias without bleeding                            were found in the rectum and in the distal sigmoid                            colon c/w mild XRT changes.                           Multiple medium-mouthed diverticula were found in                            the sigmoid colon. Complications:  No immediate complications. Estimated Blood Loss:     Estimated blood loss: none. Impression:               - Malignant tumor in the distal rectum- much                            smaller than before. Biopsied.                           - Mild XRT related changes.                           - Diverticulosis in the sigmoid colon. Recommendation:           - Discharge patient to home.                           - Await pathology results.                           - FU with Dr Timmy                           - The findings and recommendations were discussed                            with the patient's family. Lynnie Bring, MD 07/22/2024 11:14:23 AM This report has been signed electronically.

## 2024-07-22 NOTE — Progress Notes (Unsigned)
 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 female    Past Medical History:  Diagnosis Date   Anemia    Anxiety    Aortic stenosis 02/26/2023   TTE 01/21/23: EF 60-65, no RWMA, NL RVSF, NL PASP, RVSP 31.5, mild MR, mild AS, mean 10, Vmax 222 cm/s, DI 0.45, RAP 3   Cataract    Colon polyps    DDD (degenerative disc disease), cervical    Diabetes mellitus    Diabetes mellitus, type 2 (HCC)    Emphysema of lung (HCC)    History of radiation therapy    Rectum- 02/06/24-04/07/24- Dr. 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   Mucous retention cyst of maxillary sinus    Osteoporosis    Pinched nerve In neck   Plantar fasciitis    Stroke [facility])    Vitamin D deficiency     Past Surgical History:  Procedure Laterality Date   APPENDECTOMY     BIOPSY  01/19/2023   Procedure: BIOPSY;  Surgeon: Albertus Gordy HERO, MD;  Location: [facility];  Service: Gastroenterology;;   BIOPSY  03/21/2023   Procedure: BIOPSY;  Surgeon: Charlanne Groom, MD;  Location: [facility];  Service: Gastroenterology;;   BIOPSY  03/22/2023   Procedure: BIOPSY;  Surgeon: Charlanne Groom, MD;  Location: [facility];  Service: Gastroenterology;;   CHOLECYSTECTOMY     2004   COLONOSCOPY N/A 02/24/2013   Procedure: COLONOSCOPY;  Surgeon: Gwendlyn ONEIDA Buddy, MD;  Location: [facility];  Service: Endoscopy;  Laterality: N/A;   COLONOSCOPY W/ POLYPECTOMY     COLONOSCOPY WITH PROPOFOL  N/A 12/03/2017   Procedure: COLONOSCOPY WITH PROPOFOL ;  Surgeon: Teressa Toribio SQUIBB, MD;  Location: [facility];  Service: Gastroenterology;  Laterality: N/A;   ESOPHAGOGASTRODUODENOSCOPY     ESOPHAGOGASTRODUODENOSCOPY N/A 02/24/2013   Procedure: ESOPHAGOGASTRODUODENOSCOPY (EGD);  Surgeon:  Gwendlyn ONEIDA Buddy, MD;  Location: [facility];  Service: Endoscopy;  Laterality: N/A;   ESOPHAGOGASTRODUODENOSCOPY (EGD) WITH PROPOFOL  N/A 12/03/2017   Procedure: ESOPHAGOGASTRODUODENOSCOPY (EGD) WITH PROPOFOL ;  Surgeon: Teressa Toribio SQUIBB, MD;  Location: [facility] ENDOSCOPY;  Service: Gastroenterology;  Laterality: N/A;   ESOPHAGOGASTRODUODENOSCOPY (EGD) WITH PROPOFOL  N/A 03/21/2023   Procedure: ESOPHAGOGASTRODUODENOSCOPY (EGD) WITH PROPOFOL ;  Surgeon: Charlanne Groom, MD;  Location: [facility];  Service: Gastroenterology;  Laterality: N/A;   EYE SURGERY     bilateral catracts   FLEXIBLE SIGMOIDOSCOPY N/A 01/19/2023   Procedure: FLEXIBLE SIGMOIDOSCOPY;  Surgeon: Albertus Gordy HERO, MD;  Location: [facility];  Service: Gastroenterology;  Laterality: N/A;   FLEXIBLE SIGMOIDOSCOPY N/A 03/22/2023   Procedure: FLEXIBLE SIGMOIDOSCOPY;  Surgeon: Charlanne Groom, MD;  Location: [facility];  Service: Gastroenterology;  Laterality: N/A;   HOT HEMOSTASIS N/A 03/21/2023   Procedure: HOT HEMOSTASIS (ARGON PLASMA COAGULATION/BICAP);  Surgeon: Charlanne Groom, MD;  Location: [facility];  Service: Gastroenterology;  Laterality: N/A;   IR IMAGING GUIDED PORT INSERTION  03/26/2023    Prior to Admission medications   Medication Sig Start Date End Date Taking? Authorizing Provider  acetaminophen  (TYLENOL ) 325 MG tablet Take 2 tablets (650 mg total) by mouth every 6 (six) hours as needed for mild pain (or Fever >/= 101). 01/28/23  Yes Lue Elsie BROCKS, MD  ALPRAZolam  (XANAX ) 0.5 MG tablet Take 0.5 mg by mouth 3 (three) times daily as needed for anxiety. For anxiety   Yes [provider]  amiodarone  (PACERONE ) 200 MG tablet Take 0.5 tablets (100 mg total) by mouth daily. 08/21/23  Yes Nahser, Aleene PARAS, MD  amLODipine  (NORVASC ) 5 MG tablet Take 5 mg by mouth in the morning and at bedtime.   Yes [provider]  cholecalciferol (VITAMIN D3) 25 MCG (1000 UNIT) tablet Take 1,000 Units by mouth daily.   Yes [provider]  cyanocobalamin  1000 MCG tablet Take 1 tablet (1,000 mcg total) by mouth daily. 01/28/23  Yes Lue Elsie BROCKS, MD  escitalopram  (LEXAPRO ) 5 MG tablet Take 5 mg by mouth at bedtime.   Yes [provider]  famotidine  (PEPCID ) 20 MG tablet Take 20 mg by mouth daily.   Yes [provider]  feeding supplement (ENSURE ENLIVE / ENSURE PLUS) LIQD Take 237 mLs by mouth 3 (three) times daily with meals. Patient taking differently: Take 237 mLs by mouth 3 (three) times daily as needed (nutrition). 01/28/23  Yes Lue Elsie BROCKS, MD  hydrALAZINE  (APRESOLINE ) 25 MG tablet Take 1 tablet (25 mg total) by mouth 2 (two) times daily. 12/14/23  Yes Nahser, Aleene PARAS, MD  levothyroxine  (SYNTHROID ) 175 MCG tablet Take 175 mcg by mouth every morning. 04/27/23  Yes [provider]  loratadine  (CLARITIN ) 10 MG tablet Take 1 tablet (10 mg total) by mouth daily. 01/28/23  Yes Lue Elsie BROCKS, MD  polyethylene glycol (MIRALAX  / GLYCOLAX ) 17 g packet Take 17 g by mouth daily as needed for mild constipation. 01/28/23  Yes Lue Elsie BROCKS, MD  potassium chloride  SA (K-DUR,KLOR-CON ) 20 MEQ tablet Take 20 mEq by mouth daily.   Yes [provider]  prochlorperazine  (COMPAZINE ) 10 MG tablet Take 1 tablet (10 mg total) by mouth every 6 (six) hours as needed. 09/20/23  Yes Ennever, Maude SAUNDERS, MD  triamterene -hydrochlorothiazide  (MAXZIDE-25) 37.5-25 MG tablet Take 1 tablet by mouth daily. 08/20/23  Yes Timmy Maude SAUNDERS, MD  clotrimazole  (LOTRIMIN ) 1 % cream Apply 1 Application topically 2 (two) times daily. Patient not taking: Reported on 01/03/2024 08/20/23   Timmy Maude SAUNDERS, MD  fluticasone  (FLONASE ) 50 MCG/ACT nasal spray Place 2 sprays into both nostrils daily. 01/28/23   Lue Elsie BROCKS, MD  lidocaine -prilocaine  (EMLA ) cream Apply 1 Application topically as needed. 08/20/23   Timmy Maude SAUNDERS, MD  TRUE METRIX BLOOD GLUCOSE TEST test strip  03/12/23   [provider]     Current Outpatient Medications  Medication Sig Dispense Refill   ALPRAZolam  (XANAX ) 0.5 MG tablet Take 0.5 mg by mouth 2 (two) times daily. For anxiety     amiodarone  (PACERONE ) 200 MG tablet Take 0.5 tablets (100 mg total) by mouth daily. 45 tablet 3   amLODipine  (NORVASC ) 5 MG tablet Take 1 tablet (5 mg total) by mouth in the morning and at bedtime. 180 tablet 2   cholecalciferol (VITAMIN D3) 25 MCG (1000 UNIT) tablet Take 1,000 Units by mouth daily.     cyanocobalamin  1000 MCG tablet Take 1 tablet (1,000 mcg total) by mouth daily. 30 tablet 0   escitalopram  (LEXAPRO ) 5 MG tablet Take 5 mg by mouth at bedtime.     famotidine  (PEPCID ) 20 MG tablet Take 20 mg by mouth at bedtime.     fluticasone  (FLONASE ) 50 MCG/ACT nasal spray Place 2 sprays into both nostrils daily. 11.1 mL 0   hydrALAZINE  (APRESOLINE ) 25 MG tablet  Take 1 tablet (25 mg total) by mouth 2 (two) times daily. 180 tablet 3   levothyroxine  (SYNTHROID ) 175 MCG tablet Take 175 mcg by mouth every morning.     loratadine  (CLARITIN ) 10 MG tablet Take 1 tablet (10 mg total) by mouth daily. 30 tablet 0   prochlorperazine  (COMPAZINE ) 10 MG tablet Take 1 tablet (10 mg total) by mouth every 6 (six) hours as needed. 30 tablet 1   acetaminophen  (TYLENOL ) 325 MG tablet Take 2 tablets (650 mg total) by mouth every 6 (six) hours as needed for mild pain (or Fever >/= 101). 30 tablet 0   clotrimazole  (LOTRIMIN ) 1 % cream Apply 1 Application topically 2 (two) times daily. 30 g 0   diphenoxylate -atropine  (LOMOTIL ) 2.5-0.025 MG tablet Take 1 tablet by mouth 4 (four) times daily as needed for diarrhea or loose stools. Take 1-2 tabs PO bid-qid prn; Max: 8 tabs/day; Info: reduce dose when sx controlled; D/C after 10 days if no improvement 80 tablet 1   lidocaine -prilocaine  (EMLA ) cream Apply to affected area once 30 g 3   liver oil-zinc  oxide (DESITIN) 40 % ointment Apply topically as needed for irritation. 56.7 g 0   loperamide  (IMODIUM  A-D) 2 MG  tablet Take 2 mg by mouth in the morning, at noon, in the evening, and at bedtime.     nystatin -triamcinolone  ointment (MYCOLOG) Apply 1 Application topically 2 (two) times daily. 30 g 0   ondansetron  (ZOFRAN ) 8 MG tablet Take 1 tablet (8 mg total) by mouth every 8 (eight) hours as needed for nausea or vomiting. (Patient not taking: Reported on 07/22/2024) 30 tablet 1   oxyCODONE -acetaminophen  (PERCOCET/ROXICET) 5-325 MG tablet Take 1 tablet by mouth every 6 (six) hours as needed for moderate pain (pain score 4-6) or severe pain (pain score 7-10). 20 tablet 0   potassium chloride  SA (KLOR-CON  M) 20 MEQ tablet Take 2 tablets (40 mEq total) by mouth 2 (two) times daily. 120 tablet 1   triamcinolone  (KENALOG ) 0.025 % ointment Apply 1 Application topically 2 (two) times daily. 30 g 0   triamterene -hydrochlorothiazide  (MAXZIDE-25) 37.5-25 MG tablet TAKE 1 TABLET BY MOUTH EVERY DAY 90 tablet 1   Current Facility-Administered Medications  Medication Dose Route Frequency Provider Last Rate Last Admin   0.9 %  sodium chloride  infusion  500 mL Intravenous Once Charlanne Groom, MD        Allergies as of 07/22/2024 - Review Complete 07/22/2024  Allergen Reaction Noted   Aggrenox  [aspirin -dipyridamole  er] Other (See Comments) 03/19/2023   Prednisone  Other (See Comments) 09/27/2011    Family History  Problem Relation Age of Onset   Colon polyps Son    Colon cancer Son    Diabetes Other    Esophageal cancer Neg Hx    Stomach cancer Neg Hx    Rectal cancer Neg Hx     Social History   Socioeconomic History   Marital status: Widowed    Spouse name: Not on file   Number of children: 3   Years of education: Not on file   Highest education level: Not on file  Occupational History   Occupation: retired  Tobacco Use   Smoking status: Former    Current packs/day: 0.00    Types: Cigarettes    Start date: 1951    Quit date: 1995    Years since quitting: 30.8   Smokeless tobacco: Never   Tobacco  comments:    Quit about 30 years ago  Building Services Engineer  status: Never Used  Substance and Sexual Activity   Alcohol  use: No   Drug use: No   Sexual activity: Not Currently  Other Topics Concern   Not on file  Social History Narrative   Not on file   Social Drivers of Health   Financial Resource Strain: Medium Risk (02/23/2023)   Overall Financial Resource Strain (CARDIA)    Difficulty of Paying Living Expenses: Somewhat hard  Food Insecurity: No Food Insecurity (02/27/2024)   Hunger Vital Sign    Worried About Running Out of Food in the Last Year: Never true    Ran Out of Food in the Last Year: Never true  Transportation Needs: No Transportation Needs (02/27/2024)   PRAPARE - Administrator, Civil Service (Medical): No    Lack of Transportation (Non-Medical): No  Pineda Activity: Not on file  Stress: Not on file  Social Connections: Moderately Integrated (02/27/2024)   Social Connection and Isolation Panel    Frequency of Communication with Friends and Family: Three times a week    Frequency of Social Gatherings with Friends and Family: More than three times a week    Attends Religious Services: 1 to 4 times per year    Active Member of Clubs or Organizations: No    Attends Banker Meetings: 1 to 4 times per year    Marital Status: Widowed  Intimate Partner Violence: Not At Risk (02/27/2024)   Humiliation, Afraid, Rape, and Kick questionnaire    Fear of Current or Ex-Partner: No    Emotionally Abused: No    Physically Abused: No    Sexually Abused: No    Review of Systems: Positive for none except continued bleeding All other review of systems negative except as mentioned in the HPI.  Pineda Exam: Vital signs in last 24 hours: @VSRANGES @   General:   Alert,  Well-developed, well-nourished, pleasant and cooperative in NAD Lungs:  Clear throughout to auscultation.   Heart:  Regular rate and rhythm; no murmurs, clicks, rubs,  or  gallops. Abdomen:  Soft, nontender and nondistended. Normal bowel sounds.   Neuro/Psych:  Alert and cooperative. Normal mood and affect. A and O x 3    No significant changes were identified.  The patient continues to be an appropriate candidate for the planned procedure and anesthesia.   Anselm Bring, MD. [facility] 07/22/2024 11:41 AM@

## 2024-07-22 NOTE — Progress Notes (Unsigned)
To pacu, vss. Report to [email or web address] 

## 2024-07-22 NOTE — Progress Notes (Unsigned)
 Called to room to assist during endoscopic procedure.  Patient ID and intended procedure confirmed with present staff. Received instructions for my participation in the procedure from the performing physician.

## 2024-07-23 ENCOUNTER — Other Ambulatory Visit: Payer: Self-pay

## 2024-07-23 ENCOUNTER — Telehealth: Payer: Self-pay

## 2024-07-23 NOTE — Telephone Encounter (Signed)
  Follow up Call-     07/22/2024   10:07 AM 01/03/2024    9:02 AM  Call back number  Post procedure Call Back phone  # [phone] (son)  Permission to leave phone message Yes Yes     Patient questions:  Do you have a fever, pain , or abdominal swelling? No. Pain Score  0 *  Have you tolerated food without any problems? Yes.    Have you been able to return to your normal activities? Yes.    Do you have any questions about your discharge instructions: Diet   No. Medications  No. Follow up visit  No.  Do you have questions or concerns about your Care? No.  Actions: * If pain score is 4 or above: No action needed, pain <4.

## 2024-07-24 ENCOUNTER — Encounter: Payer: Self-pay | Admitting: Hematology & Oncology

## 2024-07-24 LAB — SURGICAL PATHOLOGY

## 2024-07-25 ENCOUNTER — Encounter: Payer: Self-pay | Admitting: Hematology & Oncology

## 2024-07-27 ENCOUNTER — Ambulatory Visit: Payer: Self-pay | Admitting: Gastroenterology

## 2024-07-29 ENCOUNTER — Encounter: Payer: Self-pay | Admitting: Hematology & Oncology

## 2024-07-31 ENCOUNTER — Encounter: Payer: Self-pay | Admitting: Hematology & Oncology

## 2024-07-31 ENCOUNTER — Inpatient Hospital Stay (HOSPITAL_BASED_OUTPATIENT_CLINIC_OR_DEPARTMENT_OTHER): Admitting: Hematology & Oncology

## 2024-07-31 ENCOUNTER — Inpatient Hospital Stay

## 2024-07-31 ENCOUNTER — Inpatient Hospital Stay: Attending: Hematology & Oncology

## 2024-07-31 ENCOUNTER — Other Ambulatory Visit: Payer: Self-pay

## 2024-07-31 VITALS — BP 174/73 | HR 75 | Temp 97.9°F | Wt 164.0 lb

## 2024-07-31 DIAGNOSIS — C20 Malignant neoplasm of rectum: Secondary | ICD-10-CM

## 2024-07-31 LAB — CBC WITH DIFFERENTIAL (CANCER CENTER ONLY)
Abs Immature Granulocytes: 0.04 K/uL (ref 0.00–0.07)
Basophils Absolute: 0 K/uL (ref 0.0–0.1)
Basophils Relative: 1 %
Eosinophils Absolute: 0.1 K/uL (ref 0.0–0.5)
Eosinophils Relative: 2 %
HCT: 37.6 % (ref 36.0–46.0)
Hemoglobin: 12.1 g/dL (ref 12.0–15.0)
Immature Granulocytes: 1 %
Lymphocytes Relative: 30 %
Lymphs Abs: 1.3 K/uL (ref 0.7–4.0)
MCH: 27.4 pg (ref 26.0–34.0)
MCHC: 32.2 g/dL (ref 30.0–36.0)
MCV: 85.3 fL (ref 80.0–100.0)
Monocytes Absolute: 0.4 K/uL (ref 0.1–1.0)
Monocytes Relative: 10 %
Neutro Abs: 2.5 K/uL (ref 1.7–7.7)
Neutrophils Relative %: 56 %
Platelet Count: 201 K/uL (ref 150–400)
RBC: 4.41 MIL/uL (ref 3.87–5.11)
RDW: 19 % — ABNORMAL HIGH (ref 11.5–15.5)
WBC Count: 4.3 K/uL (ref 4.0–10.5)
nRBC: 0 % (ref 0.0–0.2)

## 2024-07-31 LAB — CMP (CANCER CENTER ONLY)
ALT: 13 U/L (ref 0–44)
AST: 17 U/L (ref 15–41)
Albumin: 4.1 g/dL (ref 3.5–5.0)
Alkaline Phosphatase: 90 U/L (ref 38–126)
Anion gap: 12 (ref 5–15)
BUN: 17 mg/dL (ref 8–23)
CO2: 25 mmol/L (ref 22–32)
Calcium: 9.1 mg/dL (ref 8.9–10.3)
Chloride: 101 mmol/L (ref 98–111)
Creatinine: 0.67 mg/dL (ref 0.44–1.00)
GFR, Estimated: >60 mL/min (ref >60)
Glucose, Bld: 119 mg/dL — ABNORMAL HIGH (ref 70–99)
Potassium: 3.9 mmol/L (ref 3.5–5.1)
Sodium: 138 mmol/L (ref 135–145)
Total Bilirubin: 0.6 mg/dL (ref 0.0–1.2)
Total Protein: 6.4 g/dL — ABNORMAL LOW (ref 6.5–8.1)

## 2024-07-31 LAB — IRON AND IRON BINDING CAPACITY (CC-WL,HP ONLY)
Iron: 94 ug/dL (ref 28–170)
Saturation Ratios: 29 % (ref 10.4–31.8)
TIBC: 323 ug/dL (ref 250–450)
UIBC: 229 ug/dL

## 2024-07-31 LAB — FERRITIN: Ferritin: 181 ng/mL (ref 11–307)

## 2024-07-31 NOTE — Patient Instructions (Signed)

## 2024-07-31 NOTE — Progress Notes (Signed)
 Hematology and Oncology Follow Up Visit  Jadore R Behnke [id number] [birth year] 89 y.o. 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 3 weeks-s/p cycle #1  -- start on 02/09/2023 -- d/c on 04/12/2023 Xeloda  1500 mg po BID (14/7) -- s/p cycle #6 on 02/13/2023 -- changed to 1000mg  po BID on 08/31/2023 --on hold since 08/2023  5-FU/Oxaliplatin  +XRT -- s/p cycle #3 -start on 01/28/2024 -completed on 04/07/2024 Dostarlimab 500 mg IV q 3 week - start  08/14/2024     Interim History:  Ms. Mcclaran is back for follow-up.  We have documented recurrent disease.  She did have a colonoscopy.  She was had a biopsied.  The biopsy was done on 07/22/2024.  The pathology report ([id number]) showed residual adenocarcinoma.  Again, she has had aggressive radiation and chemotherapy.  She is not a candidate for any further radiation therapy.  There is no disease as noted elsewhere.  As such, I think that would be reasonable is to try her on immunotherapy with dostarlimab.  I think this would be helpful and be tolerated.  She has had no nausea or vomiting.  She has had no diarrhea.  She has had no bleeding.  She has had no rectal pain.  Currently, I would say that she has a good performance status of ECOG 1-2.   Medications:  Current Outpatient Medications:    capecitabine  (XELODA ) 500 MG tablet, 3 tablets Orally twice a day after a meal. 14 days on, 7 days off., Disp: , Rfl:    acetaminophen  (TYLENOL ) 325 MG tablet, Take 2 tablets (650 mg total) by mouth every 6 (six) hours as needed for mild pain (or Fever >/= 101)., Disp: 30 tablet, Rfl: 0   ALPRAZolam  (XANAX ) 0.5 MG tablet, Take 0.5 mg by mouth 2 (two) times daily. For anxiety, Disp: , Rfl:    amiodarone  (PACERONE ) 200 MG tablet, Take 0.5 tablets (100 mg total) by mouth daily., Disp: 45 tablet, Rfl: 3   amLODipine  (NORVASC ) 5 MG  tablet, Take 1 tablet (5 mg total) by mouth in the morning and at bedtime., Disp: 180 tablet, Rfl: 2   cholecalciferol (VITAMIN D3) 25 MCG (1000 UNIT) tablet, Take 1,000 Units by mouth daily., Disp: , Rfl:    clotrimazole  (LOTRIMIN ) 1 % cream, Apply 1 Application topically 2 (two) times daily., Disp: 30 g, Rfl: 0   cyanocobalamin  1000 MCG tablet, Take 1 tablet (1,000 mcg total) by mouth daily., Disp: 30 tablet, Rfl: 0   diphenoxylate -atropine  (LOMOTIL ) 2.5-0.025 MG tablet, Take 1 tablet by mouth 4 (four) times daily as needed for diarrhea or loose stools. Take 1-2 tabs PO bid-qid prn; Max: 8 tabs/day; Info: reduce dose when sx controlled; D/C after 10 days if no improvement, Disp: 80 tablet, Rfl: 1   escitalopram  (LEXAPRO ) 5 MG tablet, Take 5 mg by mouth at bedtime., Disp: , Rfl:    famotidine  (PEPCID ) 20 MG tablet, Take 20 mg by mouth at bedtime., Disp: , Rfl:    fluticasone  (FLONASE ) 50 MCG/ACT nasal spray, Place 2 sprays into both nostrils daily., Disp: 11.1 mL, Rfl: 0   hydrALAZINE  (APRESOLINE ) 25 MG tablet, Take 1 tablet (25 mg total) by mouth 2 (two) times daily., Disp: 180 tablet, Rfl: 3   levothyroxine  (SYNTHROID ) 175 MCG tablet, Take 175 mcg by mouth every morning., Disp: , Rfl:    lidocaine -prilocaine  (EMLA ) cream, Apply to affected area once, Disp: 30 g,  Rfl: 3   liver oil-zinc  oxide (DESITIN) 40 % ointment, Apply topically as needed for irritation., Disp: 56.7 g, Rfl: 0   loperamide  (IMODIUM  A-D) 2 MG tablet, Take 2 mg by mouth in the morning, at noon, in the evening, and at bedtime., Disp: , Rfl:    loratadine  (CLARITIN ) 10 MG tablet, Take 1 tablet (10 mg total) by mouth daily., Disp: 30 tablet, Rfl: 0   nystatin -triamcinolone  ointment (MYCOLOG), Apply 1 Application topically 2 (two) times daily., Disp: 30 g, Rfl: 0   ondansetron  (ZOFRAN ) 8 MG tablet, Take 1 tablet (8 mg total) by mouth every 8 (eight) hours as needed for nausea or vomiting. (Patient not taking: Reported on 07/22/2024),  Disp: 30 tablet, Rfl: 1   oxyCODONE -acetaminophen  (PERCOCET/ROXICET) 5-325 MG tablet, Take 1 tablet by mouth every 6 (six) hours as needed for moderate pain (pain score 4-6) or severe pain (pain score 7-10)., Disp: 20 tablet, Rfl: 0   potassium chloride  SA (KLOR-CON  M) 20 MEQ tablet, Take 2 tablets (40 mEq total) by mouth 2 (two) times daily., Disp: 120 tablet, Rfl: 1   prochlorperazine  (COMPAZINE ) 10 MG tablet, Take 1 tablet (10 mg total) by mouth every 6 (six) hours as needed., Disp: 30 tablet, Rfl: 1   triamcinolone  (KENALOG ) 0.025 % ointment, Apply 1 Application topically 2 (two) times daily., Disp: 30 g, Rfl: 0   triamterene -hydrochlorothiazide  (MAXZIDE-25) 37.5-25 MG tablet, TAKE 1 TABLET BY MOUTH EVERY DAY, Disp: 90 tablet, Rfl: 1  Current Facility-Administered Medications:    0.9 %  sodium chloride  infusion, 500 mL, Intravenous, Once, Charlanne Groom, MD  Allergies:  Allergies  Allergen Reactions   Aggrenox  [Aspirin -Dipyridamole  Er] Other (See Comments)    Severe Bleeding and Stomach Pain   Prednisone  Other (See Comments)    Climbs the walls    Past Medical History, Surgical history, Social history, and Family History were reviewed and updated.  Review of Systems: Review of Systems  Constitutional:  Positive for fatigue.  HENT:  Negative.    Eyes: Negative.   Respiratory: Negative.    Cardiovascular:  Positive for palpitations.  Gastrointestinal:  Positive for blood in stool and rectal pain.  Endocrine: Negative.   Genitourinary: Negative.    Musculoskeletal: Negative.   Skin: Negative.   Neurological:  Positive for light-headedness.  Hematological: Negative.   Psychiatric/Behavioral: Negative.      Physical Exam: Her vital signs show temperature of 97.9.  Pulse 75.  Blood pressure 174/73.  Weight is 164 pounds.   Wt Readings from Last 3 Encounters:  07/31/24 164 lb (74.4 kg)  07/22/24 164 lb (74.4 kg)  07/10/24 163 lb (73.9 kg)    Physical Exam Vitals reviewed.   HENT:     Head: Normocephalic and atraumatic.     Mouth/Throat:     Comments: On the left side of the tongue, there is a irregular area of white matter.  It probably measures probably 3 x 2 mm. Eyes:     Pupils: Pupils are equal, round, and reactive to light.  Cardiovascular:     Rate and Rhythm: Normal rate and regular rhythm.     Heart sounds: Normal heart sounds.     Comments: Cardiac exam is regular rate and rhythm.  She has no murmurs, rubs or bruits. Pulmonary:     Effort: Pulmonary effort is normal.     Breath sounds: Normal breath sounds.     Comments: She has good air movement bilaterally.  She has some crackles bilaterally.  I hear  no wheezes. Abdominal:     General: Bowel sounds are normal.     Palpations: Abdomen is soft.  Musculoskeletal:        General: No tenderness or deformity. Normal range of motion.     Cervical back: Normal range of motion.  Lymphadenopathy:     Cervical: No cervical adenopathy.  Skin:    General: Skin is warm and dry.     Findings: No erythema or rash.  Neurological:     Mental Status: She is alert and oriented to person, place, and time.  Psychiatric:        Behavior: Behavior normal.        Thought Content: Thought content normal.        Judgment: Judgment normal.     Lab Results  Component Value Date   WBC 4.3 07/10/2024   HGB 12.9 07/10/2024   HCT 39.8 07/10/2024   MCV 85.4 07/10/2024   PLT 162 07/10/2024     Chemistry      Component Value Date/Time   NA 137 07/10/2024 1520   K 4.2 07/10/2024 1520   CL 100 07/10/2024 1520   CO2 24 07/10/2024 1520   BUN 15 07/10/2024 1520   CREATININE 0.74 07/10/2024 1520      Component Value Date/Time   CALCIUM  9.3 07/10/2024 1520   ALKPHOS 98 07/10/2024 1520   AST 22 07/10/2024 1520   ALT 24 07/10/2024 1520   BILITOT 0.6 07/10/2024 1520      Impression and Plan: Ms. Rainford is a very charming 89 year old white female.  She has at least a locally advanced adenocarcinoma of the  rectum.-I would have to say that she has had metastatic disease since the recent CT scan that she had done did not show any evidence of the pulmonary nodule.  She now has had a recurrence.  I suspect that his recurrence is local..  We treated her with radiation and chemotherapy.  I know she had a tough time with the combination of of treatment.  We actually held the chemotherapy for the last 2 weeks of radiation.  Unfortunately, she still has residual disease.  I think we have to treat this.  Again I think that Dostarlimab would not be a bad idea for her.  We will try to get started after Thanksgiving.  I think the only way that we are going to really tell if things are working would be for her to have additional biopsies.  I suppose the MRI might be able to help as well as a possible PET scan.  I probably would give her 4 cycles of treatment and then repeat her scan and possible colonoscopy.  She is still in good shape so I think we should be able to treat this aggressively.    Maude JONELLE Crease, MD 11/20/20251:48 PM

## 2024-08-01 ENCOUNTER — Encounter: Payer: Self-pay | Admitting: Hematology & Oncology

## 2024-08-02 LAB — CEA (ACCESS): CEA (CHCC): <1 ng/mL (ref 0.00–5.00)

## 2024-08-03 ENCOUNTER — Other Ambulatory Visit: Payer: Self-pay

## 2024-08-06 ENCOUNTER — Other Ambulatory Visit: Payer: Self-pay

## 2024-08-08 NOTE — Progress Notes (Signed)
 Pharmacist Chemotherapy Monitoring - Initial Assessment    Anticipated start date: 08/14/2024   The following has been reviewed per standard work regarding the patient's treatment regimen: The patient's diagnosis, treatment plan and drug doses, and organ/hematologic function Lab orders and baseline tests specific to treatment regimen  The treatment plan start date, drug sequencing, and pre-medications Prior authorization status  Patient's documented medication list, including drug-drug interaction screen and prescriptions for anti-emetics and supportive care specific to the treatment regimen The drug concentrations, fluid compatibility, administration routes, and timing of the medications to be used The patient's access for treatment and lifetime cumulative dose history, if applicable  The patient's medication allergies and previous infusion related reactions, if applicable   Changes made to treatment plan:  N/A  Follow up needed:  N/A   [name], RPH, 08/08/2024  11:08 AM

## 2024-08-09 ENCOUNTER — Other Ambulatory Visit: Payer: Self-pay

## 2024-08-09 ENCOUNTER — Emergency Department (HOSPITAL_COMMUNITY): Admission: EM | Admit: 2024-08-09 | Discharge: 2024-08-09 | Disposition: A | Discharge status: Home / Self Care

## 2024-08-09 ENCOUNTER — Encounter (HOSPITAL_COMMUNITY): Payer: Self-pay | Admitting: *Deleted

## 2024-08-09 ENCOUNTER — Emergency Department (HOSPITAL_COMMUNITY)

## 2024-08-09 DIAGNOSIS — Z85038 Personal history of other malignant neoplasm of large intestine: Secondary | ICD-10-CM | POA: Diagnosis not present

## 2024-08-09 DIAGNOSIS — M79605 Pain in left leg: Secondary | ICD-10-CM | POA: Insufficient documentation

## 2024-08-09 NOTE — ED Provider Notes (Signed)
 [facility] Emergency Department Provider Note MRN:  [id number]  Arrival date & time: 08/09/24     Chief Complaint   Leg Pain   History of Present Illness   [name] is a 89 y.o. year-old female presents to the ED with chief complaint of left leg pain.  States that she began having pain last night.  States that she has had some swelling in the left leg and knee.  Reports that she has some blockages in the left leg.  States that there is about a 90% blockage.  She denies fever or chills.  She does have history of colorectal cancer.  She has tried using heat.  History provided by patient.   Review of Systems  Pertinent positive and negative review of systems noted in HPI.    Physical Exam   Vitals:   08/09/24 2136  BP: (!) 188/66  Pulse: 73  Resp: 16  Temp: 97.6 F (36.4 C)  SpO2: 94%    CONSTITUTIONAL:  non toxic-appearing, NAD NEURO:  Alert and oriented x 3, CN 3-12 grossly intact EYES:  eyes equal and reactive ENT/NECK:  Supple, no stridor  CARDIO:  normal rate, regular rhythm, appears well-perfused, strong DP pulses, foot is warm, no evidence of ischemic leg  PULM:  No respiratory distress,  GI/GU:  non-distended,  MSK/SPINE:  No gross deformities, no edema, moves all extremities, mild swelling about the left knee, no erythema or evidence of infection, no calf tenderness SKIN:  no rash, atraumatic   *Additional and/or pertinent findings included in MDM below  Diagnostic and Interventional Summary    EKG Interpretation Date/Time:    Ventricular Rate:    PR Interval:    QRS Duration:    QT Interval:    QTC Calculation:   R Axis:      Text Interpretation:         Labs Reviewed - No data to display  DG Knee Complete 4 Views Left  Final Result    LE VENOUS    (Results Pending)    Medications - No data to display   Procedures  /  Critical Care Procedures  ED Course and Medical Decision Making  I have reviewed the triage  vital signs, the nursing notes, and pertinent available records from the EMR.  Social Determinants Affecting Complexity of Care: Patient has no clinically significant social determinants affecting this chief complaint..   ED Course:    Medical Decision Making Patient here with left knee pain and thigh pain.  Onset was yesterday.  She denies known injury.  Plain films of the left knee show soft tissue swelling of the left lateral knee and a small joint effusion.  She does not have any erythema or excessive warmth, doubt septic arthritis.  She does not have any calf tenderness or significant swelling of the lower leg, DVT is considered, but thought less likely.  However, due to her cancer history, will order outpatient DVT study in the morning.  She has normal pulses in the foot, the foot is warm with good cap refill, I doubt ischemic leg.  Will have her follow-up for DVT study in the morning and in the meantime we will treat with a knee brace for comfort.  If DVT study is negative, recommend further workup with orthopedics.  Amount and/or Complexity of Data Reviewed Radiology: ordered.         Consultants: No consultations were needed in caring for this patient.   Treatment and  Plan: I considered admission due to patient's initial presentation, but after considering the examination and diagnostic results, patient will not require admission and can be discharged with outpatient follow-up.    Final Clinical Impressions(s) / ED Diagnoses     ICD-10-CM   1. Left leg pain  M79.605       ED Discharge Orders          Ordered    LE VENOUS        08/09/24 2219              Discharge Instructions Discussed with and Provided to Patient:   Discharge Instructions   None      Vicky Charleston, PA-C 08/09/24 2223    Simon Lavonia SAILOR, MD 08/09/24 2251

## 2024-08-09 NOTE — ED Triage Notes (Signed)
 Pt arrived with daughter in law for thigh pain onset last night. Swelling noted. Denies injury to leg (no recent travel, no fall). Pedal pulses present

## 2024-08-10 ENCOUNTER — Ambulatory Visit (HOSPITAL_COMMUNITY)
Admission: RE | Admit: 2024-08-10 | Discharge: 2024-08-10 | Disposition: A | Source: Ambulatory Visit | Attending: Emergency Medicine | Admitting: Emergency Medicine

## 2024-08-10 DIAGNOSIS — M25569 Pain in unspecified knee: Secondary | ICD-10-CM | POA: Insufficient documentation

## 2024-08-10 DIAGNOSIS — M25469 Effusion, unspecified knee: Secondary | ICD-10-CM | POA: Insufficient documentation

## 2024-08-10 DIAGNOSIS — M7989 Other specified soft tissue disorders: Secondary | ICD-10-CM

## 2024-08-10 DIAGNOSIS — M25562 Pain in left knee: Secondary | ICD-10-CM

## 2024-08-10 NOTE — Progress Notes (Signed)
 VASCULAR LAB    Left lower extremity venous duplex has been performed.  See CV proc for preliminary results.   Shigeru Lampert, RVT 08/10/2024, 12:13 PM

## 2024-08-14 ENCOUNTER — Encounter: Payer: Self-pay | Admitting: Hematology & Oncology

## 2024-08-14 ENCOUNTER — Inpatient Hospital Stay

## 2024-08-19 ENCOUNTER — Inpatient Hospital Stay: Attending: Hematology & Oncology

## 2024-08-19 ENCOUNTER — Ambulatory Visit: Payer: Self-pay | Admitting: Hematology & Oncology

## 2024-08-19 ENCOUNTER — Inpatient Hospital Stay

## 2024-08-19 VITALS — BP 164/72 | HR 77

## 2024-08-19 DIAGNOSIS — Z888 Allergy status to other drugs, medicaments and biological substances status: Secondary | ICD-10-CM | POA: Insufficient documentation

## 2024-08-19 DIAGNOSIS — C20 Malignant neoplasm of rectum: Secondary | ICD-10-CM | POA: Insufficient documentation

## 2024-08-19 DIAGNOSIS — Z5112 Encounter for antineoplastic immunotherapy: Secondary | ICD-10-CM | POA: Insufficient documentation

## 2024-08-19 DIAGNOSIS — Z886 Allergy status to analgesic agent status: Secondary | ICD-10-CM | POA: Diagnosis not present

## 2024-08-19 DIAGNOSIS — Z79899 Other long term (current) drug therapy: Secondary | ICD-10-CM | POA: Diagnosis not present

## 2024-08-19 DIAGNOSIS — Z79890 Hormone replacement therapy (postmenopausal): Secondary | ICD-10-CM | POA: Diagnosis not present

## 2024-08-19 LAB — CMP (CANCER CENTER ONLY)
ALT: 9 U/L (ref 0–44)
AST: 14 U/L — ABNORMAL LOW (ref 15–41)
Albumin: 4.2 g/dL (ref 3.5–5.0)
Alkaline Phosphatase: 93 U/L (ref 38–126)
Anion gap: 10 (ref 5–15)
BUN: 17 mg/dL (ref 8–23)
CO2: 29 mmol/L (ref 22–32)
Calcium: 9.1 mg/dL (ref 8.9–10.3)
Chloride: 98 mmol/L (ref 98–111)
Creatinine: 0.68 mg/dL (ref 0.44–1.00)
GFR, Estimated: >60 mL/min (ref >60)
Glucose, Bld: 125 mg/dL — ABNORMAL HIGH (ref 70–99)
Potassium: 3.5 mmol/L (ref 3.5–5.1)
Sodium: 136 mmol/L (ref 135–145)
Total Bilirubin: 0.6 mg/dL (ref 0.0–1.2)
Total Protein: 6.7 g/dL (ref 6.5–8.1)

## 2024-08-19 LAB — CBC WITH DIFFERENTIAL (CANCER CENTER ONLY)
Abs Immature Granulocytes: 0.05 K/uL (ref 0.00–0.07)
Basophils Absolute: 0 K/uL (ref 0.0–0.1)
Basophils Relative: 1 %
Eosinophils Absolute: 0.1 K/uL (ref 0.0–0.5)
Eosinophils Relative: 1 %
HCT: 37.6 % (ref 36.0–46.0)
Hemoglobin: 12.2 g/dL (ref 12.0–15.0)
Immature Granulocytes: 1 %
Lymphocytes Relative: 18 %
Lymphs Abs: 1.4 K/uL (ref 0.7–4.0)
MCH: 27.4 pg (ref 26.0–34.0)
MCHC: 32.4 g/dL (ref 30.0–36.0)
MCV: 84.3 fL (ref 80.0–100.0)
Monocytes Absolute: 0.5 K/uL (ref 0.1–1.0)
Monocytes Relative: 6 %
Neutro Abs: 5.7 K/uL (ref 1.7–7.7)
Neutrophils Relative %: 73 %
Platelet Count: 193 K/uL (ref 150–400)
RBC: 4.46 MIL/uL (ref 3.87–5.11)
RDW: 20 % — ABNORMAL HIGH (ref 11.5–15.5)
WBC Count: 7.8 K/uL (ref 4.0–10.5)
nRBC: 0 % (ref 0.0–0.2)

## 2024-08-19 LAB — TSH: TSH: 3.13 u[IU]/mL (ref 0.350–4.500)

## 2024-08-19 MED ORDER — ONDANSETRON HCL 8 MG PO TABS: 8.0000 mg | ORAL_TABLET | Freq: Three times a day (TID) | ORAL | 1 refills | Status: AC | PRN

## 2024-08-19 MED ORDER — LIDOCAINE-PRILOCAINE 2.5-2.5 % EX CREA: TOPICAL_CREAM | CUTANEOUS | 3 refills | Status: AC

## 2024-08-19 MED ORDER — SODIUM CHLORIDE 0.9 % IV SOLN: INTRAVENOUS | Status: DC

## 2024-08-19 MED ORDER — SODIUM CHLORIDE 0.9 % IV SOLN
500.0000 mg | Freq: Once | INTRAVENOUS | Status: AC
  Administered 2024-08-19: 500 mg via INTRAVENOUS
  Filled 2024-08-19: qty 10

## 2024-08-19 MED ORDER — PROCHLORPERAZINE MALEATE 10 MG PO TABS: 10.0000 mg | ORAL_TABLET | Freq: Four times a day (QID) | ORAL | 1 refills | Status: AC | PRN

## 2024-08-19 NOTE — Patient Instructions (Signed)

## 2024-08-20 ENCOUNTER — Encounter: Payer: Self-pay | Admitting: *Deleted

## 2024-08-20 LAB — T4: T4, Total: 13 ug/dL — ABNORMAL HIGH (ref 4.5–12.0)

## 2024-08-22 ENCOUNTER — Telehealth: Payer: Self-pay | Admitting: *Deleted

## 2024-08-22 ENCOUNTER — Encounter: Payer: Self-pay | Admitting: Hematology & Oncology

## 2024-08-22 NOTE — Telephone Encounter (Signed)
 Pt instructed to go to Urgent Care with symptoms after recent tx.

## 2024-08-25 ENCOUNTER — Inpatient Hospital Stay: Admitting: Hematology & Oncology

## 2024-08-25 ENCOUNTER — Inpatient Hospital Stay

## 2024-08-27 ENCOUNTER — Encounter: Payer: Self-pay | Admitting: Hematology & Oncology

## 2024-08-29 ENCOUNTER — Other Ambulatory Visit: Payer: Self-pay

## 2024-09-02 ENCOUNTER — Inpatient Hospital Stay

## 2024-09-02 ENCOUNTER — Inpatient Hospital Stay: Attending: Hematology & Oncology

## 2024-09-02 ENCOUNTER — Inpatient Hospital Stay: Admitting: Hematology & Oncology

## 2024-09-02 ENCOUNTER — Other Ambulatory Visit: Payer: Self-pay

## 2024-09-03 ENCOUNTER — Inpatient Hospital Stay

## 2024-09-03 ENCOUNTER — Inpatient Hospital Stay: Admitting: Hematology & Oncology

## 2024-09-09 ENCOUNTER — Other Ambulatory Visit: Payer: Self-pay

## 2024-09-09 ENCOUNTER — Encounter: Payer: Self-pay | Admitting: Hematology & Oncology

## 2024-09-09 ENCOUNTER — Inpatient Hospital Stay

## 2024-09-09 ENCOUNTER — Inpatient Hospital Stay (HOSPITAL_BASED_OUTPATIENT_CLINIC_OR_DEPARTMENT_OTHER): Admitting: Hematology & Oncology

## 2024-09-09 VITALS — BP 162/66 | HR 64 | Temp 97.6°F | Resp 18

## 2024-09-09 VITALS — BP 161/60 | HR 68 | Temp 97.8°F | Resp 17 | Ht 60.0 in | Wt 160.0 lb

## 2024-09-09 DIAGNOSIS — C20 Malignant neoplasm of rectum: Secondary | ICD-10-CM | POA: Diagnosis not present

## 2024-09-09 DIAGNOSIS — Z5112 Encounter for antineoplastic immunotherapy: Secondary | ICD-10-CM | POA: Diagnosis not present

## 2024-09-09 LAB — CMP (CANCER CENTER ONLY)
ALT: 11 U/L (ref 0–44)
AST: 14 U/L — ABNORMAL LOW (ref 15–41)
Albumin: 4.1 g/dL (ref 3.5–5.0)
Alkaline Phosphatase: 91 U/L (ref 38–126)
Anion gap: 11 (ref 5–15)
BUN: 12 mg/dL (ref 8–23)
CO2: 30 mmol/L (ref 22–32)
Calcium: 9.1 mg/dL (ref 8.9–10.3)
Chloride: 97 mmol/L — ABNORMAL LOW (ref 98–111)
Creatinine: 0.71 mg/dL (ref 0.44–1.00)
GFR, Estimated: >60 mL/min
Glucose, Bld: 135 mg/dL — ABNORMAL HIGH (ref 70–99)
Potassium: 3.4 mmol/L — ABNORMAL LOW (ref 3.5–5.1)
Sodium: 138 mmol/L (ref 135–145)
Total Bilirubin: 0.6 mg/dL (ref 0.0–1.2)
Total Protein: 6.6 g/dL (ref 6.5–8.1)

## 2024-09-09 LAB — CBC WITH DIFFERENTIAL (CANCER CENTER ONLY)
Abs Immature Granulocytes: 0.02 K/uL (ref 0.00–0.07)
Basophils Absolute: 0 K/uL (ref 0.0–0.1)
Basophils Relative: 1 %
Eosinophils Absolute: 0.1 K/uL (ref 0.0–0.5)
Eosinophils Relative: 1 %
HCT: 38.2 % (ref 36.0–46.0)
Hemoglobin: 12.4 g/dL (ref 12.0–15.0)
Immature Granulocytes: 0 %
Lymphocytes Relative: 20 %
Lymphs Abs: 1.1 K/uL (ref 0.7–4.0)
MCH: 27.1 pg (ref 26.0–34.0)
MCHC: 32.5 g/dL (ref 30.0–36.0)
MCV: 83.4 fL (ref 80.0–100.0)
Monocytes Absolute: 0.4 K/uL (ref 0.1–1.0)
Monocytes Relative: 8 %
Neutro Abs: 3.7 K/uL (ref 1.7–7.7)
Neutrophils Relative %: 70 %
Platelet Count: 193 K/uL (ref 150–400)
RBC: 4.58 MIL/uL (ref 3.87–5.11)
RDW: 20.6 % — ABNORMAL HIGH (ref 11.5–15.5)
WBC Count: 5.3 K/uL (ref 4.0–10.5)
nRBC: 0 % (ref 0.0–0.2)

## 2024-09-09 LAB — IRON AND IRON BINDING CAPACITY (CC-WL,HP ONLY)
Iron: 67 ug/dL (ref 28–170)
Saturation Ratios: 19 % (ref 10.4–31.8)
TIBC: 354 ug/dL (ref 250–450)
UIBC: 287 ug/dL

## 2024-09-09 LAB — FERRITIN: Ferritin: 141 ng/mL (ref 11–307)

## 2024-09-09 LAB — CEA (ACCESS): CEA (CHCC): <1 ng/mL (ref 0.00–5.00)

## 2024-09-09 MED ORDER — SODIUM CHLORIDE 0.9 % IV SOLN
500.0000 mg | Freq: Once | INTRAVENOUS | Status: AC
  Administered 2024-09-09: 500 mg via INTRAVENOUS
  Filled 2024-09-09: qty 10

## 2024-09-09 MED ORDER — SODIUM CHLORIDE 0.9 % IV SOLN: INTRAVENOUS | Status: DC

## 2024-09-09 NOTE — Patient Instructions (Signed)
 Dostarlimab  Injection What is this medication? DOSTARLIMAB  (dos tar li mab) treats some types of cancer. It works by helping your immune system slow or stop the spread of cancer cells. It is a monoclonal antibody. This medicine may be used for other purposes; ask your health care provider or pharmacist if you have questions. COMMON BRAND NAME(S): Jemperli  What should I tell my care team before I take this medication? They need to know if you have any of these conditions: Allogeneic stem cell transplant (uses someone else's stem cells) Autoimmune diseases, such as Crohn disease, ulcerative colitis, lupus History of chest radiation Nervous system problems, such as Guillain-Barre syndrome, myasthenia gravis Organ transplant An unusual or allergic reaction to dostarlimab , other medications, foods, dyes, or preservatives Pregnant or trying to get pregnant Breast-feeding How should I use this medication? This medication is injected into a vein. It is given by your care team in a hospital or clinic setting. A special MedGuide will be given to you before each treatment. Be sure to read this information carefully each time. Talk to your care team about the use of this medication in children. Special care may be needed. Overdosage: If you think you have taken too much of this medicine contact a poison control center or emergency room at once. NOTE: This medicine is only for you. Do not share this medicine with others. What if I miss a dose? Keep appointments for follow-up doses. It is important not to miss your dose. Call your care team if you are unable to keep an appointment. What may interact with this medication? Interactions have not been studied. This list may not describe all possible interactions. Give your health care provider a list of all the medicines, herbs, non-prescription drugs, or dietary supplements you use. Also tell them if you smoke, drink alcohol , or use illegal drugs. Some items  may interact with your medicine. What should I watch for while using this medication? Your condition will be monitored carefully while you are receiving this medication. You may need blood work while taking this medication. This medication may cause serious skin reactions. They can happen weeks to months after starting the medication. Contact your care team right away if you notice fevers or flu-like symptoms with a rash. The rash may be red or purple and then turn into blisters or peeling of the skin. You may also notice a red rash with swelling of the face, lips, or lymph nodes in your neck or under your arms. Tell your care team right away if you have any change in your eyesight. Talk to your care team if you may be pregnant. Serious birth defects can occur if you take this medication during pregnancy and for 4 months after the last dose. You will need a negative pregnancy test before starting this medication. Contraception is recommended while taking this medication and for 4 months after the last dose. Your care team can help you find the option that works for you. Do not breastfeed while taking this medication and for 4 months after the last dose. What side effects may I notice from receiving this medication? Side effects that you should report to your care team as soon as possible: Allergic reactions--skin rash, itching, hives, swelling of the face, lips, tongue, or throat Dry cough, shortness of breath or trouble breathing Eye pain, redness, irritation, or discharge with blurry or decreased vision Heart muscle inflammation--unusual weakness or fatigue, shortness of breath, chest pain, fast or irregular heartbeat, dizziness, swelling of the  ankles, feet, or hands Hormone gland problems--headache, sensitivity to light, unusual weakness or fatigue, dizziness, fast or irregular heartbeat, increased sensitivity to cold or heat, excessive sweating, constipation, hair loss, increased thirst or amount  of urine, tremors or shaking, irritability Infusion reactions--chest pain, shortness of breath or trouble breathing, feeling faint or lightheaded Kidney injury (glomerulonephritis)--decrease in the amount of urine, red or dark brown urine, foamy or bubbly urine, swelling of the ankles, hands, or feet Liver injury--right upper belly pain, loss of appetite, nausea, light-colored stool, dark yellow or brown urine, yellowing skin or eyes, unusual weakness or fatigue Pain, tingling, or numbness in the hands or feet, muscle weakness, change in vision, confusion or trouble speaking, loss of balance or coordination, trouble walking, seizures Rash, fever, and swollen lymph nodes Redness, blistering, peeling, or loosening of the skin, including inside the mouth Sudden or severe stomach pain, bloody diarrhea, fever, nausea, vomiting Side effects that usually do not require medical attention (report these to your care team if they continue or are bothersome): Bone, joint, or muscle pain Diarrhea Fatigue Loss of appetite Nausea Skin rash This list may not describe all possible side effects. Call your doctor for medical advice about side effects. You may report side effects to FDA at 1-800-FDA-1088. Where should I keep my medication? This medication is given in a hospital or clinic. It will not be stored at home. NOTE: This sheet is a summary. It may not cover all possible information. If you have questions about this medicine, talk to your doctor, pharmacist, or health care provider.  2024 Elsevier/Gold Standard (2022-01-13 00:00:00)

## 2024-09-09 NOTE — Patient Instructions (Signed)

## 2024-09-09 NOTE — Progress Notes (Signed)
 " Hematology and Oncology Follow Up Visit  Nohealani R Sawaya [id number] 06/30/1929 89 y.o. 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 3 weeks-s/p cycle #1  -- start on 02/09/2023 -- d/c on 04/12/2023 Xeloda  1500 mg po BID (14/7) -- s/p cycle #6 on 02/13/2023 -- changed to 1000mg  po BID on 08/31/2023 --on hold since 08/2023  5-FU/Oxaliplatin  +XRT -- s/p cycle #3 -start on 01/28/2024 -completed on 04/07/2024 Dostarlimab  500 mg IV q 3 week - s/p cycle #1 - start on 08/14/2024     Interim History:  Ms. Lukacs is back for follow-up.  She is doing quite well.  She tolerated her first cycle of treatment with dostarlimab  nicely.  She has a little bit of diarrhea.  She had a little bit of bowel incontinence.  I am sure this is probably from her past treatments.  There is been no problems with cough.  She has had no pain.  She has had no bleeding.  She has had no nausea or vomiting..  She had a very nice Christmas.  She ate quite well.  She did have injections into her knees.  She has arthritis in the knees.  This did seem to help.  Overall, I would say that her performance status is probably ECOG 1.    Medications:  Current Outpatient Medications:    acetaminophen  (TYLENOL ) 325 MG tablet, Take 2 tablets (650 mg total) by mouth every 6 (six) hours as needed for mild pain (or Fever >/= 101)., Disp: 30 tablet, Rfl: 0   ALPRAZolam  (XANAX ) 0.5 MG tablet, Take 0.5 mg by mouth 2 (two) times daily. For anxiety, Disp: , Rfl:    amiodarone  (PACERONE ) 200 MG tablet, Take 0.5 tablets (100 mg total) by mouth daily., Disp: 45 tablet, Rfl: 3   amLODipine  (NORVASC ) 5 MG tablet, Take 1 tablet (5 mg total) by mouth in the morning and at bedtime., Disp: 180 tablet, Rfl: 2   capecitabine  (XELODA ) 500 MG tablet, 3 tablets Orally twice a day after a meal. 14 days on, 7 days off., Disp: , Rfl:     cholecalciferol (VITAMIN D3) 25 MCG (1000 UNIT) tablet, Take 1,000 Units by mouth daily., Disp: , Rfl:    clotrimazole  (LOTRIMIN ) 1 % cream, Apply 1 Application topically 2 (two) times daily., Disp: 30 g, Rfl: 0   cyanocobalamin  1000 MCG tablet, Take 1 tablet (1,000 mcg total) by mouth daily., Disp: 30 tablet, Rfl: 0   diphenoxylate -atropine  (LOMOTIL ) 2.5-0.025 MG tablet, Take 1 tablet by mouth 4 (four) times daily as needed for diarrhea or loose stools. Take 1-2 tabs PO bid-qid prn; Max: 8 tabs/day; Info: reduce dose when sx controlled; D/C after 10 days if no improvement, Disp: 80 tablet, Rfl: 1   escitalopram  (LEXAPRO ) 5 MG tablet, Take 5 mg by mouth at bedtime., Disp: , Rfl:    famotidine  (PEPCID ) 20 MG tablet, Take 20 mg by mouth at bedtime., Disp: , Rfl:    fluticasone  (FLONASE ) 50 MCG/ACT nasal spray, Place 2 sprays into both nostrils daily., Disp: 11.1 mL, Rfl: 0   hydrALAZINE  (APRESOLINE ) 25 MG tablet, Take 1 tablet (25 mg total) by mouth 2 (two) times daily., Disp: 180 tablet, Rfl: 3   levothyroxine  (SYNTHROID ) 175 MCG tablet, Take 175 mcg by mouth every morning., Disp: , Rfl:    lidocaine -prilocaine  (EMLA ) cream, Apply to affected area once, Disp: 30 g, Rfl: 3   liver  oil-zinc  oxide (DESITIN) 40 % ointment, Apply topically as needed for irritation., Disp: 56.7 g, Rfl: 0   loperamide  (IMODIUM  A-D) 2 MG tablet, Take 2 mg by mouth in the morning, at noon, in the evening, and at bedtime., Disp: , Rfl:    loratadine  (CLARITIN ) 10 MG tablet, Take 1 tablet (10 mg total) by mouth daily., Disp: 30 tablet, Rfl: 0   nystatin -triamcinolone  ointment (MYCOLOG), Apply 1 Application topically 2 (two) times daily., Disp: 30 g, Rfl: 0   ondansetron  (ZOFRAN ) 8 MG tablet, Take 1 tablet (8 mg total) by mouth every 8 (eight) hours as needed for nausea or vomiting., Disp: 30 tablet, Rfl: 1   oxyCODONE -acetaminophen  (PERCOCET/ROXICET) 5-325 MG tablet, Take 1 tablet by mouth every 6 (six) hours as needed for  moderate pain (pain score 4-6) or severe pain (pain score 7-10)., Disp: 20 tablet, Rfl: 0   potassium chloride  SA (KLOR-CON  M) 20 MEQ tablet, Take 2 tablets (40 mEq total) by mouth 2 (two) times daily., Disp: 120 tablet, Rfl: 1   prochlorperazine  (COMPAZINE ) 10 MG tablet, Take 1 tablet (10 mg total) by mouth every 6 (six) hours as needed., Disp: 30 tablet, Rfl: 1   prochlorperazine  (COMPAZINE ) 10 MG tablet, Take 1 tablet (10 mg total) by mouth every 6 (six) hours as needed for nausea or vomiting., Disp: 30 tablet, Rfl: 1   triamcinolone  (KENALOG ) 0.025 % ointment, Apply 1 Application topically 2 (two) times daily., Disp: 30 g, Rfl: 0   triamterene -hydrochlorothiazide  (MAXZIDE-25) 37.5-25 MG tablet, TAKE 1 TABLET BY MOUTH EVERY DAY, Disp: 90 tablet, Rfl: 1  Current Facility-Administered Medications:    0.9 %  sodium chloride  infusion, 500 mL, Intravenous, Once, Charlanne Groom, MD  Allergies:  Allergies  Allergen Reactions   Aggrenox  [Aspirin -Dipyridamole  Er] Other (See Comments)    Severe Bleeding and Stomach Pain   Prednisone  Other (See Comments)    Climbs the walls    Past Medical History, Surgical history, Social history, and Family History were reviewed and updated.  Review of Systems: Review of Systems  Constitutional:  Positive for fatigue.  HENT:  Negative.    Eyes: Negative.   Respiratory: Negative.    Cardiovascular:  Positive for palpitations.  Gastrointestinal:  Positive for blood in stool and rectal pain.  Endocrine: Negative.   Genitourinary: Negative.    Musculoskeletal: Negative.   Skin: Negative.   Neurological:  Positive for light-headedness.  Hematological: Negative.   Psychiatric/Behavioral: Negative.      Physical Exam: Her vital signs show temperature of 97.9.  Pulse 75.  Blood pressure 174/73.  Weight is 160 pounds.   Wt Readings from Last 3 Encounters:  09/09/24 160 lb (72.6 kg)  07/31/24 164 lb (74.4 kg)  07/22/24 164 lb (74.4 kg)    Physical  Exam Vitals reviewed.  HENT:     Head: Normocephalic and atraumatic.     Mouth/Throat:     Comments: On the left side of the tongue, there is a irregular area of white matter.  It probably measures probably 3 x 2 mm. Eyes:     Pupils: Pupils are equal, round, and reactive to light.  Cardiovascular:     Rate and Rhythm: Normal rate and regular rhythm.     Heart sounds: Normal heart sounds.     Comments: Cardiac exam is regular rate and rhythm.  She has no murmurs, rubs or bruits. Pulmonary:     Effort: Pulmonary effort is normal.     Breath sounds: Normal breath sounds.  Comments: She has good air movement bilaterally.  She has some crackles bilaterally.  I hear no wheezes. Abdominal:     General: Bowel sounds are normal.     Palpations: Abdomen is soft.  Musculoskeletal:        General: No tenderness or deformity. Normal range of motion.     Cervical back: Normal range of motion.  Lymphadenopathy:     Cervical: No cervical adenopathy.  Skin:    General: Skin is warm and dry.     Findings: No erythema or rash.  Neurological:     Mental Status: She is alert and oriented to person, place, and time.  Psychiatric:        Behavior: Behavior normal.        Thought Content: Thought content normal.        Judgment: Judgment normal.     Lab Results  Component Value Date   WBC 5.3 09/09/2024   HGB 12.4 09/09/2024   HCT 38.2 09/09/2024   MCV 83.4 09/09/2024   PLT 193 09/09/2024     Chemistry      Component Value Date/Time   NA 136 08/19/2024 1314   K 3.5 08/19/2024 1314   CL 98 08/19/2024 1314   CO2 29 08/19/2024 1314   BUN 17 08/19/2024 1314   CREATININE 0.68 08/19/2024 1314      Component Value Date/Time   CALCIUM  9.1 08/19/2024 1314   ALKPHOS 93 08/19/2024 1314   AST 14 (L) 08/19/2024 1314   ALT 9 08/19/2024 1314   BILITOT 0.6 08/19/2024 1314      Impression and Plan: Ms. Buser is a very charming 89 year old white female.  She has at least a locally advanced  adenocarcinoma of the rectum.-I would have to say that she has had metastatic disease since the recent CT scan that she had done did not show any evidence of the pulmonary nodule.  She now has had a recurrence.  I suspect that his recurrence is local..  We treated her with radiation and chemotherapy.  I know she had a tough time with the combination of of treatment.  We actually held the chemotherapy for the last 2 weeks of radiation.  We will go ahead with her second cycle of immunotherapy.  I am very happy that she is doing so well right now.  This is really all about quality of life.  I know she will have a wonderful New Year's dinner.  We will plan to get her back in another 3 weeks.  Again, my plan is for 4 cycles of treatment and then repeat with scans and possible PET scan and maybe even colonoscopy.   Maude JONELLE Crease, MD 12/30/20259:37 AM "

## 2024-09-10 ENCOUNTER — Other Ambulatory Visit: Payer: Self-pay

## 2024-09-12 ENCOUNTER — Other Ambulatory Visit: Payer: Self-pay

## 2024-09-24 ENCOUNTER — Telehealth: Payer: Self-pay | Admitting: Genetic Counselor

## 2024-09-24 NOTE — Telephone Encounter (Signed)
 Molecular Testing Review Molecular test report from [place] reviewed by dentist.   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   Please discuss with patient and refer to genetic counseling if interested.   Burnard Ogren, MS, [facility] Licensed, [email or web address] phone: [phone]

## 2024-09-30 ENCOUNTER — Inpatient Hospital Stay

## 2024-09-30 ENCOUNTER — Inpatient Hospital Stay: Attending: Hematology & Oncology

## 2024-09-30 ENCOUNTER — Inpatient Hospital Stay: Admitting: Hematology & Oncology

## 2024-09-30 ENCOUNTER — Encounter: Payer: Self-pay | Admitting: Hematology & Oncology

## 2024-09-30 VITALS — BP 166/66 | HR 72 | Temp 97.8°F | Resp 20

## 2024-09-30 VITALS — BP 129/55 | HR 70 | Resp 18 | Wt 163.0 lb

## 2024-09-30 DIAGNOSIS — C20 Malignant neoplasm of rectum: Secondary | ICD-10-CM

## 2024-09-30 DIAGNOSIS — Z79620 Long term (current) use of immunosuppressive biologic: Secondary | ICD-10-CM | POA: Insufficient documentation

## 2024-09-30 DIAGNOSIS — Z79890 Hormone replacement therapy (postmenopausal): Secondary | ICD-10-CM | POA: Diagnosis not present

## 2024-09-30 DIAGNOSIS — Z923 Personal history of irradiation: Secondary | ICD-10-CM | POA: Insufficient documentation

## 2024-09-30 DIAGNOSIS — R918 Other nonspecific abnormal finding of lung field: Secondary | ICD-10-CM | POA: Diagnosis not present

## 2024-09-30 DIAGNOSIS — Z1509 Genetic susceptibility to other malignant neoplasm: Secondary | ICD-10-CM | POA: Diagnosis not present

## 2024-09-30 DIAGNOSIS — Z79899 Other long term (current) drug therapy: Secondary | ICD-10-CM | POA: Insufficient documentation

## 2024-09-30 DIAGNOSIS — Z5112 Encounter for antineoplastic immunotherapy: Secondary | ICD-10-CM | POA: Diagnosis not present

## 2024-09-30 DIAGNOSIS — Z9221 Personal history of antineoplastic chemotherapy: Secondary | ICD-10-CM | POA: Insufficient documentation

## 2024-09-30 DIAGNOSIS — R197 Diarrhea, unspecified: Secondary | ICD-10-CM | POA: Diagnosis not present

## 2024-09-30 LAB — CBC WITH DIFFERENTIAL (CANCER CENTER ONLY)
Abs Immature Granulocytes: 0.03 K/uL (ref 0.00–0.07)
Basophils Absolute: 0 K/uL (ref 0.0–0.1)
Basophils Relative: 1 %
Eosinophils Absolute: 0.1 K/uL (ref 0.0–0.5)
Eosinophils Relative: 2 %
HCT: 39.9 % (ref 36.0–46.0)
Hemoglobin: 12.7 g/dL (ref 12.0–15.0)
Immature Granulocytes: 1 %
Lymphocytes Relative: 25 %
Lymphs Abs: 1.2 K/uL (ref 0.7–4.0)
MCH: 26.7 pg (ref 26.0–34.0)
MCHC: 31.8 g/dL (ref 30.0–36.0)
MCV: 84 fL (ref 80.0–100.0)
Monocytes Absolute: 0.4 K/uL (ref 0.1–1.0)
Monocytes Relative: 8 %
Neutro Abs: 3.3 K/uL (ref 1.7–7.7)
Neutrophils Relative %: 63 %
Platelet Count: 178 K/uL (ref 150–400)
RBC: 4.75 MIL/uL (ref 3.87–5.11)
RDW: 20.6 % — ABNORMAL HIGH (ref 11.5–15.5)
WBC Count: 5 K/uL (ref 4.0–10.5)
nRBC: 0 % (ref 0.0–0.2)

## 2024-09-30 LAB — CMP (CANCER CENTER ONLY)
ALT: 13 U/L (ref 0–44)
AST: 15 U/L (ref 15–41)
Albumin: 4.2 g/dL (ref 3.5–5.0)
Alkaline Phosphatase: 88 U/L (ref 38–126)
Anion gap: 13 (ref 5–15)
BUN: 14 mg/dL (ref 8–23)
CO2: 24 mmol/L (ref 22–32)
Calcium: 9 mg/dL (ref 8.9–10.3)
Chloride: 100 mmol/L (ref 98–111)
Creatinine: 0.72 mg/dL (ref 0.44–1.00)
GFR, Estimated: >60 mL/min
Glucose, Bld: 131 mg/dL — ABNORMAL HIGH (ref 70–99)
Potassium: 3.5 mmol/L (ref 3.5–5.1)
Sodium: 137 mmol/L (ref 135–145)
Total Bilirubin: 0.7 mg/dL (ref 0.0–1.2)
Total Protein: 6.8 g/dL (ref 6.5–8.1)

## 2024-09-30 LAB — FERRITIN: Ferritin: 139 ng/mL (ref 11–307)

## 2024-09-30 LAB — IRON AND IRON BINDING CAPACITY (CC-WL,HP ONLY)
Iron: 77 ug/dL (ref 28–170)
Saturation Ratios: 21 % (ref 10.4–31.8)
TIBC: 358 ug/dL (ref 250–450)
UIBC: 282 ug/dL

## 2024-09-30 LAB — CEA (ACCESS): CEA (CHCC): <1 ng/mL (ref 0.00–5.00)

## 2024-09-30 LAB — TSH: TSH: 3.26 u[IU]/mL (ref 0.350–4.500)

## 2024-09-30 MED ORDER — SODIUM CHLORIDE 0.9 % IV SOLN
500.0000 mg | Freq: Once | INTRAVENOUS | Status: AC
  Administered 2024-09-30: 500 mg via INTRAVENOUS
  Filled 2024-09-30: qty 10

## 2024-09-30 MED ORDER — SODIUM CHLORIDE 0.9 % IV SOLN: INTRAVENOUS | Status: DC

## 2024-09-30 NOTE — Progress Notes (Signed)
 BP elevated, family called and spoke with gentleman that prepares her meds.  He reports she is taking all as directed except Triamterene -hydrochlorothiazide . BP 166/66.  Instructed to monitor at home and make appt with PCP. Daughter verbalized understanding.

## 2024-09-30 NOTE — Patient Instructions (Signed)
 [facility] - A DEPT OF [facility]  Discharge Instructions: Thank you for choosing [facility] to provide your oncology and hematology care.   If you have a lab appointment with the Cancer Center, please go directly to the Cancer Center and check in at the registration area.  Wear comfortable clothing and clothing appropriate for easy access to any Portacath or PICC line.   We strive to give you quality time with your provider. You may need to reschedule your appointment if you arrive late (15 or more minutes).  Arriving late affects you and other patients whose appointments are after yours.  Also, if you miss three or more appointments without notifying the office, you may be dismissed from the clinic at the providers discretion.      For prescription refill requests, have your pharmacy contact our office and allow 72 hours for refills to be completed.    Today you received the following chemotherapy and/or immunotherapy agents:  Jemperli       To help prevent nausea and vomiting after your treatment, we encourage you to take your nausea medication as directed.  BELOW ARE SYMPTOMS THAT SHOULD BE REPORTED IMMEDIATELY: *FEVER GREATER THAN 100.4 F (38 C) OR HIGHER *CHILLS OR SWEATING *NAUSEA AND VOMITING THAT IS NOT CONTROLLED WITH YOUR NAUSEA MEDICATION *UNUSUAL SHORTNESS OF BREATH *UNUSUAL BRUISING OR BLEEDING *URINARY PROBLEMS (pain or burning when urinating, or frequent urination) *BOWEL PROBLEMS (unusual diarrhea, constipation, pain near the anus) TENDERNESS IN MOUTH AND THROAT WITH OR WITHOUT PRESENCE OF ULCERS (sore throat, sores in mouth, or a toothache) UNUSUAL RASH, SWELLING OR PAIN  UNUSUAL VAGINAL DISCHARGE OR ITCHING   Items with * indicate a potential emergency and should be followed up as soon as possible or go to the Emergency Department if any problems should occur.  Please show the CHEMOTHERAPY ALERT CARD or IMMUNOTHERAPY  ALERT CARD at check-in to the Emergency Department and triage nurse. Should you have questions after your visit or need to cancel or reschedule your appointment, please contact [facility] - A DEPT OF [facility]  [phone] and follow the prompts.  Office hours are 8:00 a.m. to 4:30 p.m. Monday - Friday. Please note that voicemails left after 4:00 p.m. may not be returned until the following business day.  We are closed weekends and major holidays. You have access to a nurse at all times for urgent questions. Please call the main number to the clinic [phone] and follow the prompts.  For any non-urgent questions, you may also contact your provider using MyChart. We now offer e-Visits for anyone 41 and older to request care online for non-urgent symptoms. For details visit [email or web address].   Also download the MyChart app! Go to the app store, search MyChart, open the app, select [place], and log in with your MyChart username and password.

## 2024-09-30 NOTE — Patient Instructions (Signed)

## 2024-09-30 NOTE — Progress Notes (Signed)
 " Hematology and Oncology Follow Up Visit  [name] [id number] 08-01-1929 89 y.o. 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 3 weeks-s/p cycle #1  -- start on 02/09/2023 -- d/c on 04/12/2023 Xeloda  1500 mg po BID (14/7) -- s/p cycle #6 on 02/13/2023 -- changed to 1000mg  po BID on 08/31/2023 --on hold since 08/2023  5-FU/Oxaliplatin  +XRT -- s/p cycle #3 -start on 01/28/2024 -completed on 04/07/2024 Dostarlimab  500 mg IV q 3 week - s/p cycle #2 - start on 08/14/2024     Interim History:  [name] is back for follow-up.  She is doing quite well.  She tolerated her first cycle of treatment with dostarlimab  nicely.  She has a little bit of diarrhea.  She had a little bit of bowel incontinence.  I am sure this is probably from her past treatments.  Her last thyroid  levels that were done are back in December showed a TSH of 3.1.  She has had no fever.  There has been no obvious bleeding.  She has had little bit of nausea but no vomiting.  Compazine  seems to help very well for her.  Her last CEA was less than 1.  Overall, I would say that her performance status is probably ECOG 1.    Medications:  Current Outpatient Medications:    acetaminophen  (TYLENOL ) 325 MG tablet, Take 2 tablets (650 mg total) by mouth every 6 (six) hours as needed for mild pain (or Fever >/= 101)., Disp: 30 tablet, Rfl: 0   ALPRAZolam  (XANAX ) 0.5 MG tablet, Take 0.5 mg by mouth 2 (two) times daily. For anxiety, Disp: , Rfl:    amiodarone  (PACERONE ) 200 MG tablet, Take 0.5 tablets (100 mg total) by mouth daily., Disp: 45 tablet, Rfl: 3   amLODipine  (NORVASC ) 5 MG tablet, Take 1 tablet (5 mg total) by mouth in the morning and at bedtime., Disp: 180 tablet, Rfl: 2   cholecalciferol (VITAMIN D3) 25 MCG (1000 UNIT) tablet, Take 1,000 Units by mouth daily., Disp: , Rfl:    clotrimazole  (LOTRIMIN ) 1  % cream, Apply 1 Application topically 2 (two) times daily., Disp: 30 g, Rfl: 0   cyanocobalamin  1000 MCG tablet, Take 1 tablet (1,000 mcg total) by mouth daily., Disp: 30 tablet, Rfl: 0   escitalopram  (LEXAPRO ) 5 MG tablet, Take 5 mg by mouth at bedtime., Disp: , Rfl:    famotidine  (PEPCID ) 20 MG tablet, Take 20 mg by mouth at bedtime., Disp: , Rfl:    fluticasone  (FLONASE ) 50 MCG/ACT nasal spray, Place 2 sprays into both nostrils daily. (Patient taking differently: Place 2 sprays into both nostrils daily. Uses prn.), Disp: 11.1 mL, Rfl: 0   hydrALAZINE  (APRESOLINE ) 25 MG tablet, Take 1 tablet (25 mg total) by mouth 2 (two) times daily., Disp: 180 tablet, Rfl: 3   levothyroxine  (SYNTHROID ) 175 MCG tablet, Take 175 mcg by mouth every morning., Disp: , Rfl:    lidocaine -prilocaine  (EMLA ) cream, Apply to affected area once, Disp: 30 g, Rfl: 3   liver oil-zinc  oxide (DESITIN) 40 % ointment, Apply topically as needed for irritation., Disp: 56.7 g, Rfl: 0   loperamide  (IMODIUM  A-D) 2 MG tablet, Take 2 mg by mouth in the morning, at noon, in the evening, and at bedtime., Disp: , Rfl:    loratadine  (CLARITIN ) 10 MG tablet, Take 1 tablet (10 mg total) by mouth daily., Disp: 30 tablet, Rfl: 0  nystatin -triamcinolone  ointment (MYCOLOG), Apply 1 Application topically 2 (two) times daily., Disp: 30 g, Rfl: 0   ondansetron  (ZOFRAN ) 8 MG tablet, Take 1 tablet (8 mg total) by mouth every 8 (eight) hours as needed for nausea or vomiting., Disp: 30 tablet, Rfl: 1   potassium chloride  SA (KLOR-CON  M) 20 MEQ tablet, Take 2 tablets (40 mEq total) by mouth 2 (two) times daily., Disp: 120 tablet, Rfl: 1   prochlorperazine  (COMPAZINE ) 10 MG tablet, Take 1 tablet (10 mg total) by mouth every 6 (six) hours as needed for nausea or vomiting., Disp: 30 tablet, Rfl: 1   triamcinolone  (KENALOG ) 0.025 % ointment, Apply 1 Application topically 2 (two) times daily., Disp: 30 g, Rfl: 0   triamterene -hydrochlorothiazide  (MAXZIDE-25)  37.5-25 MG tablet, TAKE 1 TABLET BY MOUTH EVERY DAY, Disp: 90 tablet, Rfl: 1   capecitabine  (XELODA ) 500 MG tablet, 3 tablets Orally twice a day after a meal. 14 days on, 7 days off. (Patient not taking: Reported on 09/30/2024), Disp: , Rfl:    diphenoxylate -atropine  (LOMOTIL ) 2.5-0.025 MG tablet, Take 1 tablet by mouth 4 (four) times daily as needed for diarrhea or loose stools. Take 1-2 tabs PO bid-qid prn; Max: 8 tabs/day; Info: reduce dose when sx controlled; D/C after 10 days if no improvement (Patient not taking: Reported on 09/30/2024), Disp: 80 tablet, Rfl: 1   oxyCODONE -acetaminophen  (PERCOCET/ROXICET) 5-325 MG tablet, Take 1 tablet by mouth every 6 (six) hours as needed for moderate pain (pain score 4-6) or severe pain (pain score 7-10). (Patient not taking: Reported on 09/30/2024), Disp: 20 tablet, Rfl: 0  Current Facility-Administered Medications:    0.9 %  sodium chloride  infusion, 500 mL, Intravenous, Once, Charlanne Groom, [name]  Allergies:  Allergies  Allergen Reactions   Aggrenox  [Aspirin -Dipyridamole  Er] Other (See Comments)    Severe Bleeding and Stomach Pain   Prednisone  Other (See Comments)    Climbs the walls    Past Medical History, Surgical history, Social history, and Family History were reviewed and updated.  Review of Systems: Review of Systems  Constitutional:  Positive for fatigue.  HENT:  Negative.    Eyes: Negative.   Respiratory: Negative.    Cardiovascular:  Positive for palpitations.  Gastrointestinal:  Positive for blood in stool and rectal pain.  Endocrine: Negative.   Genitourinary: Negative.    Musculoskeletal: Negative.   Skin: Negative.   Neurological:  Positive for light-headedness.  Hematological: Negative.   Psychiatric/Behavioral: Negative.      Physical Exam: Her vital signs show temperature of 97.8.  Pulse 72.  Blood pressure 166/66.  Weight is 162 pounds. .   Wt Readings from Last 3 Encounters:  09/09/24 160 lb (72.6 kg)  07/31/24 164 lb  (74.4 kg)  07/22/24 164 lb (74.4 kg)    Physical Exam Vitals reviewed.  HENT:     Head: Normocephalic and atraumatic.     Mouth/Throat:     Comments: On the left side of the tongue, there is a irregular area of white matter.  It probably measures probably 3 x 2 mm. Eyes:     Pupils: Pupils are equal, round, and reactive to light.  Cardiovascular:     Rate and Rhythm: Normal rate and regular rhythm.     Heart sounds: Normal heart sounds.     Comments: Cardiac exam is regular rate and rhythm.  She has no murmurs, rubs or bruits. Pulmonary:     Effort: Pulmonary effort is normal.     Breath sounds: Normal breath sounds.  Comments: She has good air movement bilaterally.  She has some crackles bilaterally.  I hear no wheezes. Abdominal:     General: Bowel sounds are normal.     Palpations: Abdomen is soft.  Musculoskeletal:        General: No tenderness or deformity. Normal range of motion.     Cervical back: Normal range of motion.  Lymphadenopathy:     Cervical: No cervical adenopathy.  Skin:    General: Skin is warm and dry.     Findings: No erythema or rash.  Neurological:     Mental Status: She is alert and oriented to person, place, and time.  Psychiatric:        Behavior: Behavior normal.        Thought Content: Thought content normal.        Judgment: Judgment normal.     Lab Results  Component Value Date   WBC 5.0 09/30/2024   HGB 12.7 09/30/2024   HCT 39.9 09/30/2024   MCV 84.0 09/30/2024   PLT 178 09/30/2024     Chemistry      Component Value Date/Time   NA 138 09/09/2024 0815   K 3.4 (L) 09/09/2024 0815   CL 97 (L) 09/09/2024 0815   CO2 30 09/09/2024 0815   BUN 12 09/09/2024 0815   CREATININE 0.71 09/09/2024 0815      Component Value Date/Time   CALCIUM  9.1 09/09/2024 0815   ALKPHOS 91 09/09/2024 0815   AST 14 (L) 09/09/2024 0815   ALT 11 09/09/2024 0815   BILITOT 0.6 09/09/2024 0815      Impression and Plan: Ms. Gentz is a very charming  89 year old white female.  She has at least a locally advanced adenocarcinoma of the rectum.-I would have to say that she has had metastatic disease since the recent CT scan that she had done did not show any evidence of the pulmonary nodule.  She now has had a recurrence.  I suspect that this recurrence is local..  We treated her with radiation and chemotherapy.  I know she had a tough time with the combination of of treatment.  We actually held the chemotherapy for the last 2 weeks of radiation.  She had a follow-up colonoscopy and biopsy.  This was done in November.  This still showed that there was active cancer..  We now have her on immunotherapy.  This will be her third cycle of immunotherapy.  After 4 cycles, we will then plan for follow-up MRI and probably another colonoscopy.   Christina JONELLE Crease, [name] 1/20/202611:31 AM "

## 2024-10-01 ENCOUNTER — Encounter: Payer: Self-pay | Admitting: Hematology & Oncology

## 2024-10-01 LAB — T4: T4, Total: 15.7 ug/dL — ABNORMAL HIGH (ref 4.5–12.0)

## 2024-10-02 ENCOUNTER — Other Ambulatory Visit: Payer: Self-pay

## 2024-10-20 ENCOUNTER — Inpatient Hospital Stay: Admitting: Hematology & Oncology

## 2024-10-20 ENCOUNTER — Inpatient Hospital Stay: Attending: Hematology & Oncology

## 2024-10-20 ENCOUNTER — Inpatient Hospital Stay

## 2024-11-10 ENCOUNTER — Inpatient Hospital Stay

## 2024-11-10 ENCOUNTER — Inpatient Hospital Stay: Admitting: Hematology & Oncology

## 2024-11-10 ENCOUNTER — Inpatient Hospital Stay: Attending: Hematology & Oncology

## 2024-12-24 ENCOUNTER — Inpatient Hospital Stay

## 2024-12-24 ENCOUNTER — Inpatient Hospital Stay: Attending: Hematology & Oncology

## 2024-12-24 ENCOUNTER — Inpatient Hospital Stay: Admitting: Hematology & Oncology
